# Patient Record
Sex: Female | Born: 1955
Health system: Southern US, Community
[De-identification: ages and names within clinical notes are randomized; demographics above are authoritative.]

## PROBLEM LIST (undated history)

## (undated) DIAGNOSIS — K219 Gastro-esophageal reflux disease without esophagitis: Secondary | ICD-10-CM

## (undated) DIAGNOSIS — F319 Bipolar disorder, unspecified: Secondary | ICD-10-CM

## (undated) DIAGNOSIS — E785 Hyperlipidemia, unspecified: Secondary | ICD-10-CM

## (undated) DIAGNOSIS — H269 Unspecified cataract: Secondary | ICD-10-CM

## (undated) DIAGNOSIS — F329 Major depressive disorder, single episode, unspecified: Secondary | ICD-10-CM

## (undated) DIAGNOSIS — F411 Generalized anxiety disorder: Secondary | ICD-10-CM

## (undated) DIAGNOSIS — G4733 Obstructive sleep apnea (adult) (pediatric): Secondary | ICD-10-CM

## (undated) DIAGNOSIS — R928 Other abnormal and inconclusive findings on diagnostic imaging of breast: Secondary | ICD-10-CM

## (undated) DIAGNOSIS — I951 Orthostatic hypotension: Secondary | ICD-10-CM

## (undated) DIAGNOSIS — M1990 Unspecified osteoarthritis, unspecified site: Secondary | ICD-10-CM

## (undated) DIAGNOSIS — I209 Angina pectoris, unspecified: Secondary | ICD-10-CM

## (undated) DIAGNOSIS — F1020 Alcohol dependence, uncomplicated: Secondary | ICD-10-CM

## (undated) DIAGNOSIS — E559 Vitamin D deficiency, unspecified: Secondary | ICD-10-CM

## (undated) DIAGNOSIS — K635 Polyp of colon: Secondary | ICD-10-CM

## (undated) DIAGNOSIS — I4891 Unspecified atrial fibrillation: Secondary | ICD-10-CM

## (undated) DIAGNOSIS — I219 Acute myocardial infarction, unspecified: Secondary | ICD-10-CM

## (undated) DIAGNOSIS — H35039 Hypertensive retinopathy, unspecified eye: Secondary | ICD-10-CM

## (undated) DIAGNOSIS — Z9049 Acquired absence of other specified parts of digestive tract: Secondary | ICD-10-CM

## (undated) DIAGNOSIS — Z789 Other specified health status: Secondary | ICD-10-CM

## (undated) DIAGNOSIS — F32A Depression, unspecified: Secondary | ICD-10-CM

## (undated) DIAGNOSIS — I10 Essential (primary) hypertension: Secondary | ICD-10-CM

## (undated) DIAGNOSIS — I25119 Atherosclerotic heart disease of native coronary artery with unspecified angina pectoris: Secondary | ICD-10-CM

## (undated) DIAGNOSIS — I499 Cardiac arrhythmia, unspecified: Secondary | ICD-10-CM

## (undated) DIAGNOSIS — M5020 Other cervical disc displacement, unspecified cervical region: Secondary | ICD-10-CM

## (undated) DIAGNOSIS — F419 Anxiety disorder, unspecified: Secondary | ICD-10-CM

## (undated) DIAGNOSIS — R55 Syncope and collapse: Secondary | ICD-10-CM

## (undated) HISTORY — DX: Other specified health status: Z78.9

## (undated) HISTORY — DX: Hyperlipidemia, unspecified: E78.5

## (undated) HISTORY — DX: Bipolar disorder, unspecified: F31.9

## (undated) HISTORY — DX: Acquired absence of other specified parts of digestive tract: Z90.49

## (undated) HISTORY — DX: Unspecified osteoarthritis, unspecified site: M19.90

## (undated) HISTORY — PX: URETHRAL DILATION: SUR417

## (undated) HISTORY — DX: Orthostatic hypotension: I95.1

## (undated) HISTORY — DX: Major depressive disorder, single episode, unspecified: F32.9

## (undated) HISTORY — DX: Other cervical disc displacement, unspecified cervical region: M50.20

## (undated) HISTORY — DX: Generalized anxiety disorder: F41.1

## (undated) HISTORY — DX: Syncope and collapse: R55

## (undated) HISTORY — DX: Obstructive sleep apnea (adult) (pediatric): G47.33

## (undated) HISTORY — DX: Alcohol dependence, uncomplicated: F10.20

## (undated) HISTORY — DX: Other abnormal and inconclusive findings on diagnostic imaging of breast: R92.8

## (undated) HISTORY — PX: APPENDECTOMY: SHX54

## (undated) HISTORY — DX: Polyp of colon: K63.5

## (undated) HISTORY — DX: Anxiety disorder, unspecified: F41.9

## (undated) HISTORY — DX: Hypertensive retinopathy, unspecified eye: H35.039

## (undated) HISTORY — DX: Unspecified cataract: H26.9

## (undated) HISTORY — DX: Depression, unspecified: F32.A

## (undated) HISTORY — PX: EYE SURGERY: SHX253

## (undated) HISTORY — PX: MOUTH SURGERY: SHX715

## (undated) HISTORY — DX: Atherosclerotic heart disease of native coronary artery with unspecified angina pectoris: I25.119

---
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
## 1997-11-21 ENCOUNTER — Emergency Department (HOSPITAL_COMMUNITY): Admission: EM | Admit: 1997-11-21 | Discharge: 1997-11-21 | Payer: Self-pay | Admitting: Emergency Medicine

## 1999-10-02 ENCOUNTER — Encounter: Payer: Self-pay | Admitting: Gastroenterology

## 1999-10-02 ENCOUNTER — Encounter: Admission: RE | Admit: 1999-10-02 | Discharge: 1999-10-02 | Payer: Self-pay | Admitting: Gastroenterology

## 2000-12-09 ENCOUNTER — Encounter: Admission: RE | Admit: 2000-12-09 | Discharge: 2000-12-09 | Payer: Self-pay | Admitting: Gastroenterology

## 2000-12-09 ENCOUNTER — Encounter: Payer: Self-pay | Admitting: Gastroenterology

## 2001-04-05 ENCOUNTER — Other Ambulatory Visit: Admission: RE | Admit: 2001-04-05 | Discharge: 2001-04-05 | Payer: Self-pay | Admitting: Obstetrics and Gynecology

## 2002-05-04 ENCOUNTER — Encounter: Admission: RE | Admit: 2002-05-04 | Discharge: 2002-05-04 | Payer: Self-pay | Admitting: Gastroenterology

## 2002-05-04 ENCOUNTER — Encounter: Payer: Self-pay | Admitting: Gastroenterology

## 2002-06-07 ENCOUNTER — Encounter: Payer: Self-pay | Admitting: Gastroenterology

## 2002-06-07 ENCOUNTER — Encounter: Admission: RE | Admit: 2002-06-07 | Discharge: 2002-06-07 | Payer: Self-pay | Admitting: Gastroenterology

## 2003-06-07 ENCOUNTER — Ambulatory Visit (HOSPITAL_COMMUNITY): Admission: RE | Admit: 2003-06-07 | Discharge: 2003-06-07 | Payer: Self-pay | Admitting: Gastroenterology

## 2003-08-02 ENCOUNTER — Other Ambulatory Visit: Admission: RE | Admit: 2003-08-02 | Discharge: 2003-08-02 | Payer: Self-pay | Admitting: Obstetrics and Gynecology

## 2004-02-21 ENCOUNTER — Ambulatory Visit (HOSPITAL_COMMUNITY): Admission: RE | Admit: 2004-02-21 | Discharge: 2004-02-21 | Payer: Self-pay | Admitting: Obstetrics and Gynecology

## 2004-05-15 ENCOUNTER — Ambulatory Visit (HOSPITAL_COMMUNITY): Admission: RE | Admit: 2004-05-15 | Discharge: 2004-05-15 | Payer: Self-pay | Admitting: Obstetrics and Gynecology

## 2004-08-14 ENCOUNTER — Emergency Department (HOSPITAL_COMMUNITY): Admission: EM | Admit: 2004-08-14 | Discharge: 2004-08-14 | Payer: Self-pay | Admitting: Emergency Medicine

## 2004-08-17 ENCOUNTER — Other Ambulatory Visit: Admission: RE | Admit: 2004-08-17 | Discharge: 2004-08-17 | Payer: Self-pay | Admitting: Obstetrics and Gynecology

## 2004-10-09 ENCOUNTER — Encounter: Admission: RE | Admit: 2004-10-09 | Discharge: 2004-10-09 | Payer: Self-pay | Admitting: Obstetrics and Gynecology

## 2005-04-03 ENCOUNTER — Ambulatory Visit (HOSPITAL_COMMUNITY): Admission: RE | Admit: 2005-04-03 | Discharge: 2005-04-03 | Payer: Self-pay | Admitting: Obstetrics and Gynecology

## 2005-05-07 ENCOUNTER — Encounter: Admission: RE | Admit: 2005-05-07 | Discharge: 2005-05-07 | Payer: Self-pay | Admitting: Gastroenterology

## 2006-11-02 ENCOUNTER — Other Ambulatory Visit: Admission: RE | Admit: 2006-11-02 | Discharge: 2006-11-02 | Payer: Self-pay | Admitting: Obstetrics and Gynecology

## 2006-11-09 ENCOUNTER — Ambulatory Visit (HOSPITAL_COMMUNITY): Admission: RE | Admit: 2006-11-09 | Discharge: 2006-11-09 | Payer: Self-pay | Admitting: Obstetrics & Gynecology

## 2007-07-14 ENCOUNTER — Encounter: Admission: RE | Admit: 2007-07-14 | Discharge: 2007-07-14 | Payer: Self-pay | Admitting: Gastroenterology

## 2007-10-30 ENCOUNTER — Other Ambulatory Visit: Admission: RE | Admit: 2007-10-30 | Discharge: 2007-10-30 | Payer: Self-pay | Admitting: Gastroenterology

## 2008-07-26 DIAGNOSIS — I214 Non-ST elevation (NSTEMI) myocardial infarction: Secondary | ICD-10-CM

## 2008-07-26 HISTORY — DX: Non-ST elevation (NSTEMI) myocardial infarction: I21.4

## 2008-12-14 ENCOUNTER — Ambulatory Visit: Payer: Self-pay | Admitting: Radiology

## 2008-12-14 ENCOUNTER — Encounter: Payer: Self-pay | Admitting: Emergency Medicine

## 2008-12-15 ENCOUNTER — Inpatient Hospital Stay (HOSPITAL_COMMUNITY): Admission: EM | Admit: 2008-12-15 | Discharge: 2008-12-16 | Payer: Self-pay | Admitting: Internal Medicine

## 2008-12-15 ENCOUNTER — Ambulatory Visit: Payer: Self-pay | Admitting: Cardiology

## 2008-12-15 ENCOUNTER — Ambulatory Visit: Payer: Self-pay | Admitting: Internal Medicine

## 2008-12-16 ENCOUNTER — Encounter (INDEPENDENT_AMBULATORY_CARE_PROVIDER_SITE_OTHER): Payer: Self-pay | Admitting: Internal Medicine

## 2010-08-16 ENCOUNTER — Encounter: Payer: Self-pay | Admitting: Obstetrics and Gynecology

## 2010-11-03 LAB — LIPID PANEL
HDL: 34 mg/dL — ABNORMAL LOW (ref >39)
Total CHOL/HDL Ratio: 7.4 RATIO
VLDL: 56 mg/dL — ABNORMAL HIGH (ref 0–40)

## 2010-11-03 LAB — BASIC METABOLIC PANEL
Calcium: 9.3 mg/dL (ref 8.4–10.5)
GFR calc Af Amer: >60 mL/min (ref >60)
GFR calc non Af Amer: >60 mL/min (ref >60)
Sodium: 136 mEq/L (ref 135–145)

## 2010-11-03 LAB — POCT CARDIAC MARKERS
Myoglobin, poc: 78.8 ng/mL (ref 12–200)
Troponin i, poc: <0.05 ng/mL (ref 0.00–0.09)

## 2010-11-03 LAB — CBC
Hemoglobin: 15.1 g/dL — ABNORMAL HIGH (ref 12.0–15.0)
RBC: 4.86 MIL/uL (ref 3.87–5.11)
RDW: 12.4 % (ref 11.5–15.5)

## 2010-11-03 LAB — CARDIAC PANEL(CRET KIN+CKTOT+MB+TROPI): Troponin I: <0.01 ng/mL (ref 0.00–0.06)

## 2010-12-08 NOTE — Discharge Summary (Signed)
NAMEROSALEEN, [name] NO.:  [id number]   MEDICAL RECORD NO.:  [id number]          PATIENT TYPE:  INP   LOCATION:  3307                         FACILITY:  [facility]   PHYSICIAN:  Tasia Catchings, M.D.   DATE OF BIRTH:  February 05, 1956   DATE OF ADMISSION:  12/15/2008  DATE OF DISCHARGE:  12/16/2008                               DISCHARGE SUMMARY   DISCHARGE DIAGNOSES:  1. Atypical chest pain of uncertain etiology.  No evidence of coronary      artery disease.  2. Hypercholesterolemia.  3. Hypertension.  4. Gastroesophageal reflux disease.  5. Bipolar disorder.  6. Irritable bowel syndrome.  7. Chronic folliculitis  8. Degenerative joint disease.  9. Possible sleep apnea.  10.History of abnormal liver functions due to alcohol.   DISCHARGE MEDICATIONS:  1. Micardis 40 mg daily.  2. Cymbalta 30 mg daily.  3. Prevacid 15 mg daily.  4. Isordil 30 mg q.a.m.  5. Omega-3 fatty acids   FOLLOWUP:  The patient is to see me in the office in 1 week.   DIET:  No added salt, fat modified.   ACTIVITY:  Up ad lib.   CONDITION:  Improved.   BRIEF HISTORY:  [name] is a 55 year old patient who on Friday when  reaching her arms up to get something from a cupboard, suddenly  developed retrosternal chest pain.  The pain was steady and constant,  but seemed to be a little more at each heart beat.  The only position  that seemed to help was to recline somewhat, but sitting up at rest was  very painful and it seemed to get worse if she exerted herself.  However, the pain was not associated with dyspnea, diaphoresis, or  nausea.  The patient called the office and was instructed to go to the  emergency room, but refused.  She took an aspirin tablet and it seemed  to ease somewhat and slept Friday night without difficulty.  On Saturday  morning, the pain reoccurred and she eventually went to the emergency  room where in the [facility] with [facility].   She received IV nitroglycerin and the pain dissipated.   Physical examination at that time was unremarkable except for mild  exogenous obesity and some deformity of her joints.  Laboratory data  included the usual cardiac markers, which were negative; a normal CBC;  and a normal CMET.  Chest x-ray, portable showed no active disease.   HOSPITAL COURSE.:  1. Chest pain.  The patient was placed in step down on telemetry.  She      underwent a cardiac evaluation including a stress thallium study,      which was entirely normal.  Serial enzymes were negative.  There      was no ectopy and following the IV nitroglycerin, the pain really      dissipated, not to return.  However, the IV nitroglycerin was      discontinued and the pain remained quiescent.  The patient was able      to eat and  swallow without difficulty.  Therefore, she is being      discharged without a diagnosis, at least without a specific      diagnosis related to her pain.  She will be followed closely.      Tasia Catchings, M.D.  Electronically Signed     JW/MEDQ  D:  12/16/2008  T:  12/16/2008  Job:  [id number]

## 2010-12-08 NOTE — H&P (Signed)
NAMEJANIA, STEINKE NO.:  [id number]   MEDICAL RECORD NO.:  [id number]          PATIENT TYPE:  EMS   LOCATION:  ED                            FACILITY:  [facility]   PHYSICIAN:  Tasia Catchings, M.D.   DATE OF BIRTH:  1956-06-02   DATE OF ADMISSION:  12/14/2008  DATE OF DISCHARGE:  12/15/2008                              HISTORY & PHYSICAL   ADMITTING PHYSICIAN:  Tasia Catchings, M.D.   PRIMARY CARE PHYSICIAN:  Tasia Catchings, M.D.   CHIEF COMPLAINT:  Chest pain.   HISTORY OF PRESENT ILLNESS:  Lindsay [name] is a 55 year old Caucasian female  who lives by herself in [place] when she presented to [facility] outpatient center last night with a complaint of chest pain.  The chest pain started on Dec 13, 2008 at around 1:00 in the afternoon.  She mentioned that her chest pain is stabbing in nature and it comes on  with each heartbeat.  She mentioned that her chest pain is radiating  to  both arms, both shoulders and into the neck with each heartbeat.  She  mentioned that her chest pain gets worse with exertion and relieves with  rest.  She denies any associated symptoms like shortness of breath,  nausea, or sweating at the time of chest pain.  She did feel dizzy  because she thinks her sinuses are clogged up.  She denies any other  symptoms.  She denied any history of coronary artery disease, pulmonary  embolus, pneumonia. She did have a stress test done 15 years ago.   PAST MEDICAL HISTORY:  Positive for chronic pain, degenerative disk  disease in the spine, gastroesophageal reflux disease, hyperlipidemia,  panic attacks, appendectomy, hypertension, and bipolar disorder.   HOME MEDICATIONS:  List includes Micardis and Cymbalta.   ALLERGIES:  SULFA drugs.   FAMILY HISTORY:  Nothing remarkable.   SOCIAL HISTORY:  The patient lives by herself. She has 4 cats.  She  denied any history of tobacco use.  She previously drank heavily in the  past.  No drug use  in the past.   REVIEW OF SYSTEMS:  Positive for HPI.  Otherwise negative review of  systems x14 systems.   PHYSICAL EXAMINATION:  VITAL SIGNS:  Blood pressure 126/76, pulse 81,  respirations 18, oxygen saturation 98% on room air.  GENERAL:  Alert and oriented x3.  Lying in bed without any acute  distress.  CARDIOVASCULAR: S1 and S2 regular.  No murmurs, rubs, or gallops.  LUNGS: Clear to auscultation bilaterally.  No wheezing, rhonchi, rales,  or crackles.  ABDOMEN:  Nontender and nondistended.  Bowel sounds are present.  EXTREMITIES:  No clubbing, cyanosis, or edema.  Pulses palpable in all 4  extremities. She does have trade edema at both ankles.  SKIN: No rash or bruising.  NEUROLOGICAL:  Intact cranial nerves, motor strength, and sensation.  HEENT:  Head normocephalic and nontraumatic. Eyes:  Pupils are equal,  round, and reactive to light and accommodation.  Extraocular muscles are  intact.  Oral cavity, brace on the teeth  noted.  NECK: No thyromegaly or JVD.   LABS:  Laboratory work shows unremarkable CBC with differential.  D-  dimer was negative.  Basic metabolic panel unremarkable.  Chest x-ray  unremarkable for any acute disease.  EKG shows left bundle branch block.  We are uncertain if it is a new onset of old onset.   ASSESSMENT AND PLAN:  1. Chest pain,rule out myocardial infarction.  2. Uncontrolled hypertension, now controlled with nitroglycerin drip.  3. History of chronic pain with degenerative disk disease in spine.  4. History of gastroesophageal reflux disease.  5. History of hyperlipidemia.  6. History of panic attack.  7. History of bipolar disorder.   PLAN:  Will admit the patient on ICU step-down unit because of her need  for nitroglycerin drip. A nitroglycerin drip was started in the ER, and  the patient mentioned that her chest pain is much improved, and her  chest pain is resolved.  She is feeling better. She is continued to have  nitroglycerin drip  tonight to titrate to relieve her chest pain tonight.  The patient is full code.  The patient will be n.p.o. except for  medicines, ice chips and water.  Will recheck vitals and input and  output per protocol.  Will use oxygen by nasal cannula to get saturation  more than 90. Will provide breathing treatments as needed.  Will start  aspirin, metoprolol, Lovenox, simvastatin as per chest pain rule out MI  protocol.  Will continue Micardis and Cymbalta from home dose.  Will get  exercise Cardiolite stress test to be done in the a.m. by [facility].  Will get echocardiogram in the a.m. to be done by [facility] also.  Will provide IV morphine and p.o. Ambien, IV Zofran  p.r.n. for sleep and nausea respectively. Will give IV Nexium 40 mg q.24  h for GI prophylaxis. The patient has already received full dose Lovenox  1 mg per kg subcutaneously in the ER.  Will repeat that every 12 hours.  Will rule out MI with cardiac enzymes x3.  Further plan according to the  workup pending.      Donalynn Furlong, MD   Electronically Signed     ______________________________  Tasia Catchings, M.D.    TVP/MEDQ  D:  12/15/2008  T:  12/15/2008  Job:  [id number]   cc:   Tasia Catchings, M.D.

## 2014-08-28 ENCOUNTER — Encounter (HOSPITAL_BASED_OUTPATIENT_CLINIC_OR_DEPARTMENT_OTHER): Payer: Self-pay | Admitting: *Deleted

## 2014-08-28 ENCOUNTER — Emergency Department (HOSPITAL_BASED_OUTPATIENT_CLINIC_OR_DEPARTMENT_OTHER): Payer: BLUE CROSS/BLUE SHIELD

## 2014-08-28 ENCOUNTER — Emergency Department (HOSPITAL_BASED_OUTPATIENT_CLINIC_OR_DEPARTMENT_OTHER)
Admission: EM | Admit: 2014-08-28 | Discharge: 2014-08-28 | Disposition: A | Discharge status: Home / Self Care | Payer: BLUE CROSS/BLUE SHIELD | Attending: Emergency Medicine | Admitting: Emergency Medicine

## 2014-08-28 DIAGNOSIS — Z8639 Personal history of other endocrine, nutritional and metabolic disease: Secondary | ICD-10-CM | POA: Diagnosis not present

## 2014-08-28 DIAGNOSIS — I10 Essential (primary) hypertension: Secondary | ICD-10-CM | POA: Diagnosis not present

## 2014-08-28 DIAGNOSIS — Z79899 Other long term (current) drug therapy: Secondary | ICD-10-CM | POA: Diagnosis not present

## 2014-08-28 DIAGNOSIS — M7989 Other specified soft tissue disorders: Secondary | ICD-10-CM | POA: Diagnosis present

## 2014-08-28 DIAGNOSIS — L03011 Cellulitis of right finger: Secondary | ICD-10-CM

## 2014-08-28 HISTORY — DX: Vitamin D deficiency, unspecified: E55.9

## 2014-08-28 HISTORY — DX: Essential (primary) hypertension: I10

## 2014-08-28 HISTORY — DX: Angina pectoris, unspecified: I20.9

## 2014-08-28 HISTORY — DX: Unspecified atrial fibrillation: I48.91

## 2014-08-28 LAB — CBC WITH DIFFERENTIAL/PLATELET
BASOS ABS: 0 10*3/uL (ref 0.0–0.1)
Basophils Relative: 0 % (ref 0–1)
Eosinophils Absolute: 0 10*3/uL (ref 0.0–0.7)
Eosinophils Relative: 1 % (ref 0–5)
HCT: 42 % (ref 36.0–46.0)
HEMOGLOBIN: 14.4 g/dL (ref 12.0–15.0)
Lymphocytes Relative: 23 % (ref 12–46)
Lymphs Abs: 2 10*3/uL (ref 0.7–4.0)
MCH: 32.1 pg (ref 26.0–34.0)
MCHC: 34.3 g/dL (ref 30.0–36.0)
MCV: 93.8 fL (ref 78.0–100.0)
Monocytes Absolute: 0.9 10*3/uL (ref 0.1–1.0)
Monocytes Relative: 11 % (ref 3–12)
NEUTROS ABS: 5.8 10*3/uL (ref 1.7–7.7)
NEUTROS PCT: 65 % (ref 43–77)
PLATELETS: 253 10*3/uL (ref 150–400)
RBC: 4.48 MIL/uL (ref 3.87–5.11)
RDW: 13 % (ref 11.5–15.5)
WBC: 8.7 10*3/uL (ref 4.0–10.5)

## 2014-08-28 LAB — BASIC METABOLIC PANEL
ANION GAP: 3 — AB (ref 5–15)
BUN: 19 mg/dL (ref 6–23)
CO2: 28 mmol/L (ref 19–32)
CREATININE: 0.73 mg/dL (ref 0.50–1.10)
Calcium: 9.6 mg/dL (ref 8.4–10.5)
Chloride: 103 mmol/L (ref 96–112)
GFR calc non Af Amer: >90 mL/min (ref >90)
Glucose, Bld: 126 mg/dL — ABNORMAL HIGH (ref 70–99)
Potassium: 3.9 mmol/L (ref 3.5–5.1)
SODIUM: 134 mmol/L — AB (ref 135–145)

## 2014-08-28 LAB — URIC ACID: URIC ACID, SERUM: 4 mg/dL (ref 2.4–7.0)

## 2014-08-28 MED ORDER — CLINDAMYCIN HCL 150 MG PO CAPS
300.0000 mg | ORAL_CAPSULE | Freq: Once | ORAL | Status: AC
  Administered 2014-08-28: 300 mg via ORAL
  Filled 2014-08-28: qty 2

## 2014-08-28 MED ORDER — CLINDAMYCIN HCL 300 MG PO CAPS: 300.0000 mg | ORAL_CAPSULE | Freq: Four times a day (QID) | ORAL | Status: DC

## 2014-08-28 MED ORDER — HYDROCODONE-ACETAMINOPHEN 5-325 MG PO TABS: 1.0000 | ORAL_TABLET | Freq: Four times a day (QID) | ORAL | Status: DC | PRN

## 2014-08-28 NOTE — ED Notes (Signed)
Pt reports she noticed some redness and swelling to right thumb yesterday around the first joint- today entire digit is red and swollen- pt says worse since this morning

## 2014-08-28 NOTE — ED Provider Notes (Signed)
CSN: [id number]     Arrival date & time 08/28/14  1448 History   First MD Initiated Contact with Patient 08/28/14 1503     Chief Complaint  Patient presents with  . Hand Problem     (Consider location/radiation/quality/duration/timing/severity/associated sxs/prior Treatment) HPI Comments: Pt comes in with c/o redness and swelling to the right thumb that started yesterday. Denies any known injury. Denies history of gout and cellulitis. States that the redness worsened today. Pt states that it hurts worst and the dip of the right thumb.   The history is provided by the patient. No language interpreter was used.    Past Medical History  Diagnosis Date  . Hypertension   . Atrial fibrillation   . Anginal pain   . Vitamin D deficiency    Past Surgical History  Procedure Laterality Date  . Appendectomy    . Urethral dilation    . Eye surgery    . Mouth surgery     No family history on file. History  Substance Use Topics  . Smoking status: Never Smoker   . Smokeless tobacco: Never Used  . Alcohol Use: 1.2 oz/week    2 Not specified per week   OB History    No data available     Review of Systems  All other systems reviewed and are negative.     Allergies  Other  Home Medications   Prior to Admission medications   Medication Sig Start Date End Date Taking? Authorizing Provider  buPROPion (WELLBUTRIN) 75 MG tablet Take 75 mg by mouth daily.   Yes Historical Provider, MD  DULoxetine (CYMBALTA) 30 MG capsule Take 30 mg by mouth daily.   Yes Historical Provider, MD  losartan (COZAAR) 50 MG tablet Take 50 mg by mouth daily.   Yes Historical Provider, MD  Multiple Vitamin (MULTIVITAMIN) capsule Take 1 capsule by mouth daily.   Yes Historical Provider, MD   BP 169/88 mmHg  Pulse 77  Temp(Src) 98.6 F (37 C) (Oral)  Resp 18  Ht  (1.6 m)  Wt 180 lb (81.647 kg)  BMI 31.89 kg/m2  SpO2 100% Physical Exam  Constitutional: She is oriented to person, place, and time.  She appears well-developed and well-nourished.  Cardiovascular: Normal rate and regular rhythm.   Pulmonary/Chest: Effort normal and breath sounds normal.  Musculoskeletal: Normal range of motion.  Normal cap refill in the right thumb  Neurological: She is alert and oriented to person, place, and time.  Skin:  Redness swelling and warmth noted to the right thumb. Flexion limited by the swelling. Mild streaking toward the wrist  Nursing note and vitals reviewed.   ED Course  Procedures (including critical care time) Labs Review Labs Reviewed  BASIC METABOLIC PANEL - Abnormal; Notable for the following:    Sodium 134 (*)    Glucose, Bld 126 (*)    Anion gap 3 (*)    All other components within normal limits  CBC WITH DIFFERENTIAL/PLATELET  URIC ACID    Imaging Review Dg Finger Thumb Right  08/28/2014   CLINICAL DATA:  Pain, swelling and redness of the thumb since yesterday, especially the "PIP" joint. No known injury.  EXAM: RIGHT THUMB 2+V  COMPARISON:  None.  FINDINGS: Minimal cortical indistinctness at the marginal base of the thumb proximal phalanx. There is degenerative change at the IP joint of the thumb, with joint narrowing and hook osteophytes. No fracture or dislocation.  IMPRESSION: 1. Questionable erosion at the base of the  first proximal phalanx. Is there history of erosive arthropathy? 2. IP joint osteoarthritis.   Electronically Signed   By: Tiburcio PeaJonathan  Watts M.D.   On: 08/28/2014 15:51     EKG Interpretation None      MDM   Final diagnoses:  Cellulitis of thumb, right    Likely cellulitis with streaking. Will treat with clindamycin and hydrocodone for pain. Discussed return precautions. Doubt septic joint or gout    Teressa LowerVrinda Malanie Koloski, NP 08/28/14 1652  Ethelda ChickMartha K Linker, MD 08/28/14 [phone]

## 2014-08-28 NOTE — Discharge Instructions (Signed)
For worsening redness or swelling. You need to return to the er or follow up with the specialist Cellulitis Cellulitis is an infection of the skin and the tissue beneath it. The infected area is usually red and tender. Cellulitis occurs most often in the arms and lower legs.  CAUSES  Cellulitis is caused by bacteria that enter the skin through cracks or cuts in the skin. The most common types of bacteria that cause cellulitis are staphylococci and streptococci. SIGNS AND SYMPTOMS   Redness and warmth.  Swelling.  Tenderness or pain.  Fever. DIAGNOSIS  Your health care provider can usually determine what is wrong based on a physical exam. Blood tests may also be done. TREATMENT  Treatment usually involves taking an antibiotic medicine. HOME CARE INSTRUCTIONS   Take your antibiotic medicine as directed by your health care provider. Finish the antibiotic even if you start to feel better.  Keep the infected arm or leg elevated to reduce swelling.  Apply a warm cloth to the affected area up to 4 times per day to relieve pain.  Take medicines only as directed by your health care provider.  Keep all follow-up visits as directed by your health care provider. SEEK MEDICAL CARE IF:   You notice red streaks coming from the infected area.  Your red area gets larger or turns dark in color.  Your bone or joint underneath the infected area becomes painful after the skin has healed.  Your infection returns in the same area or another area.  You notice a swollen bump in the infected area.  You develop new symptoms.  You have a fever. SEEK IMMEDIATE MEDICAL CARE IF:   You feel very sleepy.  You develop vomiting or diarrhea.  You have a general ill feeling (malaise) with muscle aches and pains. MAKE SURE YOU:   Understand these instructions.  Will watch your condition.  Will get help right away if you are not doing well or get worse. Document Released: 04/21/2005 Document  Revised: 11/26/2013 Document Reviewed: 09/27/2011 [facility] Patient Information 2015 [place], [place]. This information is not intended to replace advice given to you by your health care provider. Make sure you discuss any questions you have with your health care provider.

## 2014-08-28 NOTE — ED Notes (Signed)
NP at bedside.

## 2014-10-24 ENCOUNTER — Other Ambulatory Visit: Payer: Self-pay | Admitting: Internal Medicine

## 2014-10-24 DIAGNOSIS — R112 Nausea with vomiting, unspecified: Secondary | ICD-10-CM

## 2014-10-25 ENCOUNTER — Other Ambulatory Visit: Payer: BLUE CROSS/BLUE SHIELD

## 2015-02-11 DIAGNOSIS — R079 Chest pain, unspecified: Secondary | ICD-10-CM | POA: Insufficient documentation

## 2015-02-11 DIAGNOSIS — R112 Nausea with vomiting, unspecified: Secondary | ICD-10-CM | POA: Insufficient documentation

## 2015-02-11 HISTORY — DX: Chest pain, unspecified: R07.9

## 2015-02-28 ENCOUNTER — Ambulatory Visit: Payer: BLUE CROSS/BLUE SHIELD | Admitting: Cardiovascular Disease

## 2015-03-03 ENCOUNTER — Encounter: Payer: BLUE CROSS/BLUE SHIELD | Admitting: Cardiovascular Disease

## 2015-03-03 DIAGNOSIS — I10 Essential (primary) hypertension: Secondary | ICD-10-CM | POA: Insufficient documentation

## 2015-03-03 HISTORY — DX: Essential (primary) hypertension: I10

## 2015-03-03 NOTE — Progress Notes (Signed)
This encounter was created in error - please disregard.

## 2015-03-23 NOTE — Progress Notes (Signed)
Cardiology Office Note   Date:  03/24/2015   ID:  Lindsay [name], DOB 1956/06/12, MRN [id number]  PCP:  Pearla Dubonnet, MD  Cardiologist:   Madilyn Hook, MD   Chief Complaint  Patient presents with  . Chest Pain    patient reports chest tightness - has gotten progressively worse over the last several months and is releived with rest, has had heart events in the past -heart attack in 2010, possible atrial fibrillation, left leg swelling, abdominal swelling, shortness of breath on exertion.  . Palpitations      History of Present Illness: Lindsay [name] is a 59 y.o. female with hypertension, CAD s/p MI 2010, and atrial fibrillation who presents for an evaluation of chest pain.  Lindsay [name] reports repeated episodes of substernal chest tightness and stabbing chest pain under her left breast. This is been ongoing for several months. In 2010 she had heart attacks that were not investigated by heart catheterization she had no insurance at that time. She has used nitroglycerin and the chest pain is alleviated with this. Recently the chest pain has been getting worse. She feels very fatigued and 2 days ago felt as though she was not able to get out of bed. Pain is exertional and gets better as soon as she stops whatever she is doing. There is no associated shortness of breath, nausea, vomiting, or diaphoresis. However Lindsay [name] has had over a year of recurrent emesis. There is no associated nausea and she attributes that to acid reflux and esophageal spasm. However she still self discontinued her PPI, and she thought this was actually contributing to the symptoms. In the last couple months she has had 2 episodes of hematemesis with large volumes of blood loss. She was not evaluated at that time. She has been seen by gastroenterologist, but they will not proceed with any endoscopic evaluation until her heart first evaluated.  Lindsay [name] denies orthopnea or PND.  She usually has palpitations, but this has  gotten better since supplementing her vitamin D.   Past Medical History  Diagnosis Date  . Hypertension   . Atrial fibrillation   . Anginal pain   . Vitamin D deficiency   . History of appendectomy 1963  . Coronary artery disease involving native coronary artery with angina pectoris 03/24/2015    Past Surgical History  Procedure Laterality Date  . Appendectomy    . Urethral dilation    . Eye surgery    . Mouth surgery       Current Outpatient Prescriptions  Medication Sig Dispense Refill  . ARIPiprazole (ABILIFY) 5 MG tablet Take 5 mg by mouth daily.    . Cholecalciferol (VITAMIN D3) 5000 UNITS CAPS Take 1 tablet by mouth daily.    . clindamycin (CLEOCIN) 300 MG capsule Take 1 capsule (300 mg total) by mouth 4 (four) times daily. 28 capsule 0  . DULoxetine (CYMBALTA) 30 MG capsule Take 30 mg by mouth daily.    [name] HYDROcodone-acetaminophen (NORCO/VICODIN) 5-325 MG per tablet Take 1-2 tablets by mouth every 6 (six) hours as needed. 15 tablet 0  . hydrOXYzine (VISTARIL) 50 MG capsule Take 1 capsule by mouth as needed.  0  . losartan (COZAAR) 50 MG tablet Take 50 mg by mouth daily.    . Multiple Vitamin (MULTIVITAMIN) capsule Take 1 capsule by mouth daily.    . nitroGLYCERIN (NITROSTAT) 0.4 MG SL tablet Place 1 tablet under the tongue as needed.    . Omega 3 1000  MG CAPS Take 1 tablet by mouth daily.    . Prenatal Vit-Fe Fum-FA-Omega (SM ONE DAILY PRENATAL) 28-0.8 & 440 MG MISC Take 1 packet by mouth daily.    . metoprolol tartrate (LOPRESSOR) 25 MG tablet Take 0.5 tablets (12.5 mg total) by mouth 2 (two) times daily. 30 tablet 11   No current facility-administered medications for this visit.    Allergies:   Other    Social History:  The patient  reports that she has never smoked. She has never used smokeless tobacco. She reports that she drinks about 1.2 oz of alcohol per week. She reports that she does not use illicit drugs.   Family History:  The patient's family history  includes Dementia in her mother; Heart attack in her maternal grandfather, maternal grandmother, and paternal grandfather; Kidney failure in her paternal grandmother; Vascular Disease in her mother.    ROS:  Please see the history of present illness.   Otherwise, review of systems are positive for none.   All other systems are reviewed and negative.    PHYSICAL EXAM: VS:  BP 142/90 mmHg  Pulse 87  Ht 5' 3.75" (1.619 m)  Wt 84.188 kg (185 lb 9.6 oz)  BMI 32.12 kg/m2 , BMI Body mass index is 32.12 kg/(m^2). GENERAL:  Well appearing HEENT:  Pupils equal round and reactive, fundi not visualized, oral mucosa unremarkable NECK:  No jugular venous distention, waveform within normal limits, carotid upstroke brisk and symmetric, no bruits, no thyromegaly LYMPHATICS:  No cervical adenopathy LUNGS:  Clear to auscultation bilaterally HEART:  RRR.  PMI not displaced or sustained,S1 and S2 within normal limits, no S3, no S4, no clicks, no rubs, no murmurs ABD:  Flat, positive bowel sounds normal in frequency in pitch, no bruits, no rebound, no guarding, no midline pulsatile mass, no hepatomegaly, no splenomegaly EXT:  2 plus pulses throughout, no edema, no cyanosis no clubbing SKIN:  No rashes no nodules NEURO:  Cranial nerves II through XII grossly intact, motor grossly intact throughout PSYCH:  Cognitively intact, oriented to person place and time    EKG:  EKG is ordered today. The ekg ordered today demonstrates sinus rhythm at 87 bpm.  Anterolateral TWI concerning for ischemia.     Recent Labs: 08/28/2014: BUN 19; Creatinine, Ser 0.73; Hemoglobin 14.4; Platelets 253; Potassium 3.9; Sodium 134*    Lipid Panel    Component Value Date/Time   CHOL * 12/15/2008 0805    250        ATP III CLASSIFICATION:  <200     mg/dL   Desirable  259-563  mg/dL   Borderline High  >=875    mg/dL   High          TRIG 643* 12/15/2008 0805   HDL 34* 12/15/2008 0805   CHOLHDL 7.4 12/15/2008 0805   VLDL 56*  12/15/2008 0805   LDLCALC * 12/15/2008 0805    160        Total Cholesterol/HDL:CHD Risk Coronary Heart Disease Risk Table                     Men   Women  1/2 Average Risk   3.4   3.3  Average Risk       5.0   4.4  2 X Average Risk   9.6   7.1  3 X Average Risk  23.4   11.0        Use the calculated Patient Ratio above and the  CHD Risk Table to determine the patient's CHD Risk.        ATP III CLASSIFICATION (LDL):  <100     mg/dL   Optimal  161-096  mg/dL   Near or Above                    Optimal  130-159  mg/dL   Borderline  045-409  mg/dL   High  >811     mg/dL   Very High      Wt Readings from Last 3 Encounters:  03/24/15 84.188 kg (185 lb 9.6 oz)  08/28/14 81.647 kg (180 lb)    TTE 12/16/08: LVEF 55-60%.  Mild LVH.  RV and RA  mildly dilated.  Other studies Reviewed: Additional studies/ records that were reviewed today include: n/a. Review of the above records demonstrates:  Please see elsewhere in the note.     ASSESSMENT AND PLAN:  # CCS Class IV angina: Lindsay [name] symptoms and ECG changes are very concerning for angina.  Given her prior NSTEMI, unknown anatomy, and ECG changes, she has a high pre-test probability of CAD and will proceed directly with cath.  She prefers to do this tomorrow rather than today.  She had active CP in the exam room, but it was alleviated with sublingual nitroglycerin.  Therefore it seems safe to wait until tomorrow.  Given her recent upper GI bleeding, she is not a great candidate for long-term P2Y12 therapy.  Would consider BMS if stents are needed.  Patient was encouraged to restart her PPI.   - BMP, CBC, coags in prep for LHC - LHC tomorrow with Dr. Herbie Baltimore - restart PPI - start metoprolol 12.5mg  bid - Will need lipids when fasting and likely start statin therapy  # Hypertension: BP above goal.   - Continue losartan - Add metoprolol tartrate 12.5mg  bid  # Atrial fibrillation: Currently in sinus rhythm.  Discussion was deferred  given that her CP was more pressing at this time.  Current medicines are reviewed at length with the patient today.  The patient does not have concerns regarding medicines.  The following changes have been made:  Start metoprolol  Labs/ tests ordered today include: coronary angiography  Orders Placed This Encounter  Procedures  . DG Chest 2 View  . Comprehensive metabolic panel  . CBC  . Protime-INR  . APTT  . TSH  . EKG 12-Lead  . LEFT HEART CATHETERIZATION WITH CORONARY/GRAFT ANGIOGRAM     Disposition:   FU with Dr. Elmarie Shiley C. [facility] Salvia post cath   Signed, Madilyn Hook, MD  03/24/2015 11:19 AM    [facility]

## 2015-03-24 ENCOUNTER — Telehealth: Payer: Self-pay | Admitting: Cardiovascular Disease

## 2015-03-24 ENCOUNTER — Ambulatory Visit
Admission: RE | Admit: 2015-03-24 | Discharge: 2015-03-24 | Disposition: A | Discharge status: Home / Self Care | Payer: BLUE CROSS/BLUE SHIELD | Source: Ambulatory Visit | Attending: Cardiovascular Disease | Admitting: Cardiovascular Disease

## 2015-03-24 ENCOUNTER — Encounter: Payer: Self-pay | Admitting: Cardiovascular Disease

## 2015-03-24 ENCOUNTER — Telehealth: Payer: Self-pay | Admitting: *Deleted

## 2015-03-24 ENCOUNTER — Ambulatory Visit (INDEPENDENT_AMBULATORY_CARE_PROVIDER_SITE_OTHER): Payer: BLUE CROSS/BLUE SHIELD | Admitting: Cardiovascular Disease

## 2015-03-24 ENCOUNTER — Encounter: Payer: Self-pay | Admitting: Cardiology

## 2015-03-24 VITALS — BP 142/90 | HR 87 | Ht 63.75 in | Wt 185.6 lb

## 2015-03-24 DIAGNOSIS — R5383 Other fatigue: Secondary | ICD-10-CM

## 2015-03-24 DIAGNOSIS — I208 Other forms of angina pectoris: Secondary | ICD-10-CM

## 2015-03-24 DIAGNOSIS — Z01818 Encounter for other preprocedural examination: Secondary | ICD-10-CM

## 2015-03-24 DIAGNOSIS — R5381 Other malaise: Secondary | ICD-10-CM

## 2015-03-24 DIAGNOSIS — D689 Coagulation defect, unspecified: Secondary | ICD-10-CM

## 2015-03-24 DIAGNOSIS — I25119 Atherosclerotic heart disease of native coronary artery with unspecified angina pectoris: Secondary | ICD-10-CM | POA: Insufficient documentation

## 2015-03-24 DIAGNOSIS — I214 Non-ST elevation (NSTEMI) myocardial infarction: Secondary | ICD-10-CM

## 2015-03-24 HISTORY — DX: Atherosclerotic heart disease of native coronary artery with unspecified angina pectoris: I25.119

## 2015-03-24 LAB — CBC
HCT: 41.2 % (ref 36.0–46.0)
HEMOGLOBIN: 14 g/dL (ref 12.0–15.0)
MCH: 31.7 pg (ref 26.0–34.0)
MCHC: 34 g/dL (ref 30.0–36.0)
MCV: 93.2 fL (ref 78.0–100.0)
MPV: 9.2 fL (ref 8.6–12.4)
PLATELETS: 274 10*3/uL (ref 150–400)
RBC: 4.42 MIL/uL (ref 3.87–5.11)
RDW: 13.3 % (ref 11.5–15.5)
WBC: 5.9 10*3/uL (ref 4.0–10.5)

## 2015-03-24 LAB — COMPREHENSIVE METABOLIC PANEL
ALT: 44 U/L — AB (ref 6–29)
AST: 34 U/L (ref 10–35)
Albumin: 4.8 g/dL (ref 3.6–5.1)
Alkaline Phosphatase: 89 U/L (ref 33–130)
BILIRUBIN TOTAL: 0.4 mg/dL (ref 0.2–1.2)
BUN: 10 mg/dL (ref 7–25)
CALCIUM: 9.7 mg/dL (ref 8.6–10.4)
CO2: 25 mmol/L (ref 20–31)
Chloride: 101 mmol/L (ref 98–110)
Creat: 0.74 mg/dL (ref 0.50–0.99)
GLUCOSE: 112 mg/dL — AB (ref 65–99)
Potassium: 4.3 mmol/L (ref 3.5–5.3)
SODIUM: 139 mmol/L (ref 135–146)
Total Protein: 7.4 g/dL (ref 6.1–8.1)

## 2015-03-24 LAB — APTT: APTT: 36 s (ref 24–37)

## 2015-03-24 LAB — PROTIME-INR
INR: 0.89 (ref <1.50)
PROTHROMBIN TIME: 12 s (ref 11.6–15.2)

## 2015-03-24 LAB — TSH: TSH: 2.652 u[IU]/mL (ref 0.350–4.500)

## 2015-03-24 MED ORDER — METOPROLOL TARTRATE 25 MG PO TABS: 12.5000 mg | ORAL_TABLET | Freq: Two times a day (BID) | ORAL | Status: DC

## 2015-03-24 NOTE — Telephone Encounter (Signed)
Jessica from [facility] is calling with some Stat Labs.[name]

## 2015-03-24 NOTE — Telephone Encounter (Signed)
MD aware that STAT labs have resulted.

## 2015-03-24 NOTE — Telephone Encounter (Signed)
-----   Message from Chilton Si, MD sent at 03/24/2015  5:07 PM EDT ----- Liver function mildly abnormal but labs are otherwise normal.

## 2015-03-24 NOTE — Patient Instructions (Addendum)
Dr Duke Salvia has requested that you have a cardiac catheterization tomorrow, August 30th. Cardiac catheterization is used to diagnose and/or treat various heart conditions. Doctors may recommend this procedure for a number of different reasons. The most common reason is to evaluate chest pain. Chest pain can be a symptom of coronary artery disease (CAD), and cardiac catheterization can show whether plaque is narrowing or blocking your heart's arteries. This procedure is also used to evaluate the valves, as well as measure the blood flow and oxygen levels in different parts of your heart. For further information please visit [email or web address] Please follow instruction sheet, as given.  Your physician has ordered you to have some blood work/chest xray prior to your procedure. The scheduler will tell you when to get this done. Please have your blood work done at the [facility] ([address]).

## 2015-03-24 NOTE — Telephone Encounter (Signed)
Spoke to patient. Result given . Verbalized understanding  

## 2015-03-25 ENCOUNTER — Ambulatory Visit (HOSPITAL_COMMUNITY)
Admission: RE | Admit: 2015-03-25 | Discharge: 2015-03-25 | Disposition: A | Discharge status: Home / Self Care | Payer: BLUE CROSS/BLUE SHIELD | Source: Ambulatory Visit | Attending: Cardiology | Admitting: Cardiology

## 2015-03-25 ENCOUNTER — Ambulatory Visit: Payer: BLUE CROSS/BLUE SHIELD | Admitting: Cardiovascular Disease

## 2015-03-25 ENCOUNTER — Encounter (HOSPITAL_COMMUNITY): Payer: Self-pay | Admitting: Cardiology

## 2015-03-25 ENCOUNTER — Encounter (HOSPITAL_COMMUNITY)
Admission: RE | Disposition: A | Discharge status: Home / Self Care | Payer: Self-pay | Source: Ambulatory Visit | Attending: Cardiology

## 2015-03-25 DIAGNOSIS — I252 Old myocardial infarction: Secondary | ICD-10-CM | POA: Insufficient documentation

## 2015-03-25 DIAGNOSIS — I214 Non-ST elevation (NSTEMI) myocardial infarction: Secondary | ICD-10-CM | POA: Diagnosis present

## 2015-03-25 DIAGNOSIS — E559 Vitamin D deficiency, unspecified: Secondary | ICD-10-CM | POA: Diagnosis not present

## 2015-03-25 DIAGNOSIS — I10 Essential (primary) hypertension: Secondary | ICD-10-CM | POA: Diagnosis present

## 2015-03-25 DIAGNOSIS — I200 Unstable angina: Secondary | ICD-10-CM | POA: Diagnosis present

## 2015-03-25 DIAGNOSIS — I25119 Atherosclerotic heart disease of native coronary artery with unspecified angina pectoris: Secondary | ICD-10-CM | POA: Diagnosis present

## 2015-03-25 DIAGNOSIS — I25110 Atherosclerotic heart disease of native coronary artery with unstable angina pectoris: Secondary | ICD-10-CM | POA: Insufficient documentation

## 2015-03-25 DIAGNOSIS — I4891 Unspecified atrial fibrillation: Secondary | ICD-10-CM | POA: Diagnosis not present

## 2015-03-25 HISTORY — PX: CARDIAC CATHETERIZATION: SHX172

## 2015-03-25 SURGERY — LEFT HEART CATH AND CORONARY ANGIOGRAPHY
Anesthesia: LOCAL

## 2015-03-25 MED ORDER — LIDOCAINE HCL (PF) 1 % IJ SOLN
INTRAMUSCULAR | Status: AC
  Filled 2015-03-25: qty 30

## 2015-03-25 MED ORDER — RADIAL COCKTAIL (HEPARIN/VERAPAMIL/LIDOCAINE/NITRO)
Status: DC | PRN
  Administered 2015-03-25: 20 mL via INTRA_ARTERIAL

## 2015-03-25 MED ORDER — MIDAZOLAM HCL 2 MG/2ML IJ SOLN
INTRAMUSCULAR | Status: AC
Start: 2015-03-25 — End: 2015-03-25
  Filled 2015-03-25: qty 4

## 2015-03-25 MED ORDER — ASPIRIN 81 MG PO CHEW
CHEWABLE_TABLET | ORAL | Status: AC
  Administered 2015-03-25: 81 mg via ORAL
  Filled 2015-03-25: qty 1

## 2015-03-25 MED ORDER — ONDANSETRON HCL 4 MG/2ML IJ SOLN: 4.0000 mg | Freq: Four times a day (QID) | INTRAMUSCULAR | Status: DC | PRN

## 2015-03-25 MED ORDER — SODIUM CHLORIDE 0.9 % IV SOLN: 250.0000 mL | INTRAVENOUS | Status: DC | PRN

## 2015-03-25 MED ORDER — LIDOCAINE HCL (PF) 1 % IJ SOLN
INTRAMUSCULAR | Status: DC | PRN
  Administered 2015-03-25: 09:00:00

## 2015-03-25 MED ORDER — SODIUM CHLORIDE 0.9 % IJ SOLN: 3.0000 mL | Freq: Two times a day (BID) | INTRAMUSCULAR | Status: DC

## 2015-03-25 MED ORDER — SODIUM CHLORIDE 0.9 % WEIGHT BASED INFUSION
3.0000 mL/kg/h | INTRAVENOUS | Status: AC
  Administered 2015-03-25: 3 mL/kg/h via INTRAVENOUS

## 2015-03-25 MED ORDER — SODIUM CHLORIDE 0.9 % WEIGHT BASED INFUSION: 3.0000 mL/kg/h | INTRAVENOUS | Status: DC

## 2015-03-25 MED ORDER — HEPARIN SODIUM (PORCINE) 1000 UNIT/ML IJ SOLN
INTRAMUSCULAR | Status: AC
  Filled 2015-03-25: qty 1

## 2015-03-25 MED ORDER — FENTANYL CITRATE (PF) 100 MCG/2ML IJ SOLN
INTRAMUSCULAR | Status: DC | PRN
  Administered 2015-03-25: 50 ug via INTRAVENOUS

## 2015-03-25 MED ORDER — HEPARIN (PORCINE) IN NACL 2-0.9 UNIT/ML-% IJ SOLN
INTRAMUSCULAR | Status: AC
  Filled 2015-03-25: qty 1000

## 2015-03-25 MED ORDER — SODIUM CHLORIDE 0.9 % WEIGHT BASED INFUSION: 1.0000 mL/kg/h | INTRAVENOUS | Status: DC

## 2015-03-25 MED ORDER — NITROGLYCERIN 1 MG/10 ML FOR IR/CATH LAB
INTRA_ARTERIAL | Status: AC
  Filled 2015-03-25: qty 10

## 2015-03-25 MED ORDER — ACETAMINOPHEN 325 MG PO TABS: 650.0000 mg | ORAL_TABLET | ORAL | Status: DC | PRN

## 2015-03-25 MED ORDER — HEPARIN SODIUM (PORCINE) 1000 UNIT/ML IJ SOLN
INTRAMUSCULAR | Status: DC | PRN
  Administered 2015-03-25: 4500 [IU] via INTRAVENOUS

## 2015-03-25 MED ORDER — SODIUM CHLORIDE 0.9 % IJ SOLN: 3.0000 mL | INTRAMUSCULAR | Status: DC | PRN

## 2015-03-25 MED ORDER — IOHEXOL 350 MG/ML SOLN
INTRAVENOUS | Status: DC | PRN
  Administered 2015-03-25: 80 mL via INTRA_ARTERIAL

## 2015-03-25 MED ORDER — VERAPAMIL HCL 2.5 MG/ML IV SOLN
INTRAVENOUS | Status: AC
  Filled 2015-03-25: qty 2

## 2015-03-25 MED ORDER — ISOSORBIDE MONONITRATE ER 30 MG PO TB24: 30.0000 mg | ORAL_TABLET | Freq: Every day | ORAL | Status: DC

## 2015-03-25 MED ORDER — MIDAZOLAM HCL 2 MG/2ML IJ SOLN
INTRAMUSCULAR | Status: DC | PRN
  Administered 2015-03-25: 2 mg via INTRAVENOUS

## 2015-03-25 MED ORDER — ASPIRIN 81 MG PO CHEW
81.0000 mg | CHEWABLE_TABLET | ORAL | Status: AC
  Administered 2015-03-25: 81 mg via ORAL

## 2015-03-25 MED ORDER — FENTANYL CITRATE (PF) 100 MCG/2ML IJ SOLN
INTRAMUSCULAR | Status: AC
  Filled 2015-03-25: qty 4

## 2015-03-25 SURGICAL SUPPLY — 11 items
CATH INFINITI 5FR ANG PIGTAIL (CATHETERS) ×2 IMPLANT
CATH INFINITI JR4 5F (CATHETERS) ×2 IMPLANT
CATH OPTITORQUE TIG 4.0 5F (CATHETERS) ×2 IMPLANT
DEVICE RAD COMP TR BAND LRG (VASCULAR PRODUCTS) ×2 IMPLANT
GLIDESHEATH SLEND A-KIT 6F 22G (SHEATH) ×2 IMPLANT
KIT HEART LEFT (KITS) ×2 IMPLANT
PACK CARDIAC CATHETERIZATION (CUSTOM PROCEDURE TRAY) ×2 IMPLANT
SYR MEDRAD MARK V 150ML (SYRINGE) ×2 IMPLANT
TRANSDUCER W/STOPCOCK (MISCELLANEOUS) ×2 IMPLANT
TUBING CIL FLEX 10 FLL-RA (TUBING) ×2 IMPLANT
WIRE SAFE-T 1.5MM-J .035X260CM (WIRE) ×2 IMPLANT

## 2015-03-25 NOTE — H&P (View-Only) (Signed)
Cardiology Office Note   Date:  03/24/2015   ID:  Lindsay [name], DOB 1956/06/12, MRN [id number]  PCP:  Pearla Dubonnet, MD  Cardiologist:   Madilyn Hook, MD   Chief Complaint  Patient presents with  . Chest Pain    patient reports chest tightness - has gotten progressively worse over the last several months and is releived with rest, has had heart events in the past -heart attack in 2010, possible atrial fibrillation, left leg swelling, abdominal swelling, shortness of breath on exertion.  . Palpitations      History of Present Illness: Lindsay [name] is a 59 y.o. female with hypertension, CAD s/p MI 2010, and atrial fibrillation who presents for an evaluation of chest pain.  Lindsay [name] reports repeated episodes of substernal chest tightness and stabbing chest pain under her left breast. This is been ongoing for several months. In 2010 she had heart attacks that were not investigated by heart catheterization she had no insurance at that time. She has used nitroglycerin and the chest pain is alleviated with this. Recently the chest pain has been getting worse. She feels very fatigued and 2 days ago felt as though she was not able to get out of bed. Pain is exertional and gets better as soon as she stops whatever she is doing. There is no associated shortness of breath, nausea, vomiting, or diaphoresis. However Lindsay [name] has had over a year of recurrent emesis. There is no associated nausea and she attributes that to acid reflux and esophageal spasm. However she still self discontinued her PPI, and she thought this was actually contributing to the symptoms. In the last couple months she has had 2 episodes of hematemesis with large volumes of blood loss. She was not evaluated at that time. She has been seen by gastroenterologist, but they will not proceed with any endoscopic evaluation until her heart first evaluated.  Lindsay [name] denies orthopnea or PND.  She usually has palpitations, but this has  gotten better since supplementing her vitamin D.   Past Medical History  Diagnosis Date  . Hypertension   . Atrial fibrillation   . Anginal pain   . Vitamin D deficiency   . History of appendectomy 1963  . Coronary artery disease involving native coronary artery with angina pectoris 03/24/2015    Past Surgical History  Procedure Laterality Date  . Appendectomy    . Urethral dilation    . Eye surgery    . Mouth surgery       Current Outpatient Prescriptions  Medication Sig Dispense Refill  . ARIPiprazole (ABILIFY) 5 MG tablet Take 5 mg by mouth daily.    . Cholecalciferol (VITAMIN D3) 5000 UNITS CAPS Take 1 tablet by mouth daily.    . clindamycin (CLEOCIN) 300 MG capsule Take 1 capsule (300 mg total) by mouth 4 (four) times daily. 28 capsule 0  . DULoxetine (CYMBALTA) 30 MG capsule Take 30 mg by mouth daily.    [name] HYDROcodone-acetaminophen (NORCO/VICODIN) 5-325 MG per tablet Take 1-2 tablets by mouth every 6 (six) hours as needed. 15 tablet 0  . hydrOXYzine (VISTARIL) 50 MG capsule Take 1 capsule by mouth as needed.  0  . losartan (COZAAR) 50 MG tablet Take 50 mg by mouth daily.    . Multiple Vitamin (MULTIVITAMIN) capsule Take 1 capsule by mouth daily.    . nitroGLYCERIN (NITROSTAT) 0.4 MG SL tablet Place 1 tablet under the tongue as needed.    . Omega 3 1000  MG CAPS Take 1 tablet by mouth daily.    . Prenatal Vit-Fe Fum-FA-Omega (SM ONE DAILY PRENATAL) 28-0.8 & 440 MG MISC Take 1 packet by mouth daily.    . metoprolol tartrate (LOPRESSOR) 25 MG tablet Take 0.5 tablets (12.5 mg total) by mouth 2 (two) times daily. 30 tablet 11   No current facility-administered medications for this visit.    Allergies:   Other    Social History:  The patient  reports that she has never smoked. She has never used smokeless tobacco. She reports that she drinks about 1.2 oz of alcohol per week. She reports that she does not use illicit drugs.   Family History:  The patient's family history  includes Dementia in her mother; Heart attack in her maternal grandfather, maternal grandmother, and paternal grandfather; Kidney failure in her paternal grandmother; Vascular Disease in her mother.    ROS:  Please see the history of present illness.   Otherwise, review of systems are positive for none.   All other systems are reviewed and negative.    PHYSICAL EXAM: VS:  BP 142/90 mmHg  Pulse 87  Ht 5' 3.75" (1.619 m)  Wt 84.188 kg (185 lb 9.6 oz)  BMI 32.12 kg/m2 , BMI Body mass index is 32.12 kg/(m^2). GENERAL:  Well appearing HEENT:  Pupils equal round and reactive, fundi not visualized, oral mucosa unremarkable NECK:  No jugular venous distention, waveform within normal limits, carotid upstroke brisk and symmetric, no bruits, no thyromegaly LYMPHATICS:  No cervical adenopathy LUNGS:  Clear to auscultation bilaterally HEART:  RRR.  PMI not displaced or sustained,S1 and S2 within normal limits, no S3, no S4, no clicks, no rubs, no murmurs ABD:  Flat, positive bowel sounds normal in frequency in pitch, no bruits, no rebound, no guarding, no midline pulsatile mass, no hepatomegaly, no splenomegaly EXT:  2 plus pulses throughout, no edema, no cyanosis no clubbing SKIN:  No rashes no nodules NEURO:  Cranial nerves II through XII grossly intact, motor grossly intact throughout PSYCH:  Cognitively intact, oriented to person place and time    EKG:  EKG is ordered today. The ekg ordered today demonstrates sinus rhythm at 87 bpm.  Anterolateral TWI concerning for ischemia.     Recent Labs: 08/28/2014: BUN 19; Creatinine, Ser 0.73; Hemoglobin 14.4; Platelets 253; Potassium 3.9; Sodium 134*    Lipid Panel    Component Value Date/Time   CHOL * 12/15/2008 0805    250        ATP III CLASSIFICATION:  <200     mg/dL   Desirable  259-563  mg/dL   Borderline High  >=875    mg/dL   High          TRIG 643* 12/15/2008 0805   HDL 34* 12/15/2008 0805   CHOLHDL 7.4 12/15/2008 0805   VLDL 56*  12/15/2008 0805   LDLCALC * 12/15/2008 0805    160        Total Cholesterol/HDL:CHD Risk Coronary Heart Disease Risk Table                     Men   Women  1/2 Average Risk   3.4   3.3  Average Risk       5.0   4.4  2 X Average Risk   9.6   7.1  3 X Average Risk  23.4   11.0        Use the calculated Patient Ratio above and the  CHD Risk Table to determine the patient's CHD Risk.        ATP III CLASSIFICATION (LDL):  <100     mg/dL   Optimal  161-096  mg/dL   Near or Above                    Optimal  130-159  mg/dL   Borderline  045-409  mg/dL   High  >811     mg/dL   Very High      Wt Readings from Last 3 Encounters:  03/24/15 84.188 kg (185 lb 9.6 oz)  08/28/14 81.647 kg (180 lb)    TTE 12/16/08: LVEF 55-60%.  Mild LVH.  RV and RA  mildly dilated.  Other studies Reviewed: Additional studies/ records that were reviewed today include: n/a. Review of the above records demonstrates:  Please see elsewhere in the note.     ASSESSMENT AND PLAN:  # CCS Class IV angina: Lindsay [name] symptoms and ECG changes are very concerning for angina.  Given her prior NSTEMI, unknown anatomy, and ECG changes, she has a high pre-test probability of CAD and will proceed directly with cath.  She prefers to do this tomorrow rather than today.  She had active CP in the exam room, but it was alleviated with sublingual nitroglycerin.  Therefore it seems safe to wait until tomorrow.  Given her recent upper GI bleeding, she is not a great candidate for long-term P2Y12 therapy.  Would consider BMS if stents are needed.  Patient was encouraged to restart her PPI.   - BMP, CBC, coags in prep for LHC - LHC tomorrow with Dr. Herbie Baltimore - restart PPI - start metoprolol 12.5mg  bid - Will need lipids when fasting and likely start statin therapy  # Hypertension: BP above goal.   - Continue losartan - Add metoprolol tartrate 12.5mg  bid  # Atrial fibrillation: Currently in sinus rhythm.  Discussion was deferred  given that her CP was more pressing at this time.  Current medicines are reviewed at length with the patient today.  The patient does not have concerns regarding medicines.  The following changes have been made:  Start metoprolol  Labs/ tests ordered today include: coronary angiography  Orders Placed This Encounter  Procedures  . DG Chest 2 View  . Comprehensive metabolic panel  . CBC  . Protime-INR  . APTT  . TSH  . EKG 12-Lead  . LEFT HEART CATHETERIZATION WITH CORONARY/GRAFT ANGIOGRAM     Disposition:   FU with Dr. Elmarie Shiley C. Duke Salvia post cath   Signed, Madilyn Hook, MD  03/24/2015 11:19 AM    [facility]

## 2015-03-25 NOTE — Interval H&P Note (Signed)
History and Physical Interval Note:  03/25/2015 7:48 AM  Lindsay [name]  has presented today for surgery, with the diagnosis of Unstable Angina. The various methods of treatment have been discussed with the patient and family. After consideration of risks, benefits and other options for treatment, the patient has consented to  Procedure(s): Left Heart Cath and Coronary Angiography (N/A) +/- PCI as a surgical intervention .  The patient's history has been reviewed, patient examined, no change in status, stable for surgery.  I have reviewed the patient's chart and labs.  Questions were answered to the patient's satisfaction.     Cath Lab Visit (complete for each Cath Lab visit)  Clinical Evaluation Leading to the Procedure:   ACS: Yes.    Non-ACS:    Anginal Classification: CCS IV  Anti-ischemic medical therapy: Minimal Therapy (1 class of medications)  Non-Invasive Test Results: No non-invasive testing performed  Prior CABG: No previous CABG  AUC  TIMI SCORE  Patient Information:  TIMI Score is 2  UA/NSTEMI and low-risk features (e.g., TIMI score  Revascularization of the presumed culprit artery   U (6)  Indication: 9; Score: 6      HARDING, DAVID W

## 2015-03-25 NOTE — Discharge Instructions (Signed)
Radial Site Care °Refer to this sheet in the next few weeks. These instructions provide you with information on caring for yourself after your procedure. Your caregiver may also give you more specific instructions. Your treatment has been planned according to current medical practices, but problems sometimes occur. Call your caregiver if you have any problems or questions after your procedure. °HOME CARE INSTRUCTIONS °· You may shower the day after the procedure. Remove the bandage (dressing) and gently wash the site with plain soap and water. Gently pat the site dry. °· Do not apply powder or lotion to the site. °· Do not submerge the affected site in water for 3 to 5 days. °· Inspect the site at least twice daily. °· Do not flex or bend the affected arm for 24 hours. °· No lifting over 5 pounds (2.3 kg) for 5 days after your procedure. °· Do not drive home if you are discharged the same day of the procedure. Have someone else drive you. °· You may drive 24 hours after the procedure unless otherwise instructed by your caregiver. °· Do not operate machinery or power tools for 24 hours. °· A responsible adult should be with you for the first 24 hours after you arrive home. °What to expect: °· Any bruising will usually fade within 1 to 2 weeks. °· Blood that collects in the tissue (hematoma) may be painful to the touch. It should usually decrease in size and tenderness within 1 to 2 weeks. °SEEK IMMEDIATE MEDICAL CARE IF: °· You have unusual pain at the radial site. °· You have redness, warmth, swelling, or pain at the radial site. °· You have drainage (other than a small amount of blood on the dressing). °· You have chills. °· You have a fever or persistent symptoms for more than 72 hours. °· You have a fever and your symptoms suddenly get worse. °· Your arm becomes pale, cool, tingly, or numb. °· You have heavy bleeding from the site. Hold pressure on the site. °Document Released: 08/14/2010 Document Revised:  10/04/2011 Document Reviewed: 08/14/2010 °ExitCare® Patient Information ©2015 [employer]. This information is not intended to replace advice given to you by your health care provider. Make sure you discuss any questions you have with your health care provider. ° °

## 2015-03-28 ENCOUNTER — Telehealth: Payer: Self-pay | Admitting: *Deleted

## 2015-03-28 NOTE — Telephone Encounter (Signed)
-----   Message from Chilton Si, MD sent at 03/26/2015  1:02 PM EDT ----- Normal chest x-ray.  There was some arthritis of the spine.

## 2015-03-28 NOTE — Telephone Encounter (Signed)
Left detailed message on secured voicemail . Any question may call back.

## 2015-03-31 NOTE — Progress Notes (Signed)
Cardiology Office Note   Date:  04/01/2015   ID:  Lindsay [name], DOB [birth year], MRN [id number]  PCP:  Lindsay Dubonnet, MD  Cardiologist:   Lindsay Hook, MD   Chief Complaint  Patient presents with  . Hospitalization Follow-up    post cath - no stents. patient reports some chest discomfort-the beta blocker has helped a lot. patient was prescribed doxycycline for a skin infection - hasn't started because she had questions.      History of Present Illness: Lindsay [name] is a 59 y.o. female with hypertension, CAD s/p MI 2010, and atrial fibrillation who presents for follow up post cardiac catheterization.  Lindsay [name] was evaluated in clinic on 03/24/15  with recurrent chest pain.  She was started on metoprolol and referred for cardiac catheterization.  She underwent cardiac catheterization on 8/30 that revealed a 50% RCA lesion but otherwise minimal CAD.  Lindsay [name] has been feeling better since her last clinic appointment. She thinks that starting metoprolol helped significantly. She reports one episode of chest squeezing, though it was much less severe and did not last for long.  Lindsay [name] reports that she has not been exercising. She wonders if she can start back her routine. She also endorses decreased appetite and does not frequently eat breakfast. She is looking forward to being able to undergo evaluation by GI now that she does not have obstructive coronary disease.  She reports to episodes of vomiting since her last appointment here though she did not have significant bleeding with them.   Past Medical History  Diagnosis Date  . Hypertension   . Atrial fibrillation   . Anginal pain   . Vitamin D deficiency   . History of appendectomy 1963  . Coronary artery disease involving native coronary artery with angina pectoris 03/24/2015    Past Surgical History  Procedure Laterality Date  . Appendectomy    . Urethral dilation    . Eye surgery    . Mouth surgery    . Cardiac  catheterization N/A 03/25/2015    Procedure: Left Heart Cath and Coronary Angiography;  Surgeon: Marykay Lex, MD;  Location: [facility] INVASIVE CV LAB;  Service: Cardiovascular;  Laterality: N/A;     Current Outpatient Prescriptions  Medication Sig Dispense Refill  . ARIPiprazole (ABILIFY) 5 MG tablet Take 5 mg by mouth daily.    . Ascorbic Acid (VITAMIN C PO) Take 1 tablet by mouth daily.    [name] aspirin EC 81 MG tablet Take 81 mg by mouth daily.    [name] buPROPion (WELLBUTRIN) 75 MG tablet Take 75 mg by mouth daily.    . Cholecalciferol (VITAMIN D3) 5000 UNITS CAPS Take 5,000 Units by mouth daily.     . Docosahexaenoic Acid (DHA OMEGA 3 PO) Take 1 tablet by mouth daily.    [name] doxycycline (VIBRA-TABS) 100 MG tablet Take 1 tablet by mouth 2 (two) times daily.  0  . DULoxetine (CYMBALTA) 30 MG capsule Take 30 mg by mouth daily.    . hydrOXYzine (VISTARIL) 50 MG capsule Take 1 capsule by mouth at bedtime as needed (sleep).   0  . isosorbide mononitrate (IMDUR) 30 MG 24 hr tablet Take 1 tablet (30 mg total) by mouth at bedtime. Take 30 min after Aspirin 30 tablet 3  . losartan (COZAAR) 100 MG tablet Take 100 mg by mouth daily.  3  . MANGANESE PO Take 1 tablet by mouth daily.    . metoprolol tartrate (LOPRESSOR) 25 MG  tablet Take 0.5 tablets (12.5 mg total) by mouth 2 (two) times daily. 30 tablet 11  . Multiple Vitamin (MULTIVITAMIN) capsule Take 1 capsule by mouth daily.    . Multiple Vitamins-Minerals (ZINC PO) Take 1 tablet by mouth daily.    . nitroGLYCERIN (NITROSTAT) 0.4 MG SL tablet Place 1 tablet under the tongue as needed for chest pain.     [name] POTASSIUM PO Take 1 tablet by mouth daily. OTC Potassium    . TURMERIC PO Take 1 tablet by mouth daily.     No current facility-administered medications for this visit.    Allergies:   Bee venom; Sulfa antibiotics; Cephalexin; Depakote; Risperidone; Statins; Seroquel; Topiramate; Tramadol; Citalopram; and Lamotrigine    Social History:  The patient   reports that she has never smoked. She has never used smokeless tobacco. She reports that she drinks about 1.2 oz of alcohol per week. She reports that she does not use illicit drugs.   Family History:  The patient's family history includes Dementia in her mother; Heart attack in her maternal grandfather, maternal grandmother, and paternal grandfather; Kidney failure in her paternal grandmother; Vascular Disease in her mother.    ROS:  Please see the history of present illness.   Otherwise, review of systems are positive for none.   All other systems are reviewed and negative.    PHYSICAL EXAM: VS:  BP 148/96 mmHg  Pulse 68  Ht 5' 3.75" (1.619 m)  Wt 85.095 kg (187 lb 9.6 oz)  BMI 32.46 kg/m2 , BMI Body mass index is 32.46 kg/(m^2). GENERAL:  Well appearing HEENT:  Pupils equal round and reactive, fundi not visualized, oral mucosa unremarkable NECK:  No jugular venous distention, waveform within normal limits, carotid upstroke brisk and symmetric, no bruits, no thyromegaly LYMPHATICS:  No cervical adenopathy LUNGS:  Clear to auscultation bilaterally HEART:  RRR.  PMI not displaced or sustained,S1 and S2 within normal limits, no S3, no S4, no clicks, no rubs, no murmurs ABD:  Flat, positive bowel sounds normal in frequency in pitch, no bruits, no rebound, no guarding, no midline pulsatile mass, no hepatomegaly, no splenomegaly EXT:  2 plus pulses throughout, no edema, no cyanosis no clubbing SKIN:  No rashes no nodules NEURO:  Cranial nerves II through XII grossly intact, motor grossly intact throughout PSYCH:  Cognitively intact, oriented to person place and time    EKG:  EKG is ordered today. The ekg ordered today demonstrates sinus rhythm at 87 bpm.  Anterolateral TWI concerning for ischemia.     Recent Labs: 03/24/2015: ALT 44*; BUN 10; Creat 0.74; Hemoglobin 14.0; Platelets 274; Potassium 4.3; Sodium 139; TSH 2.652    Lipid Panel    Component Value Date/Time   CHOL *  12/15/2008 0805    250        ATP III CLASSIFICATION:  <200     mg/dL   Desirable  782-956  mg/dL   Borderline High  >=213    mg/dL   High          TRIG 086* 12/15/2008 0805   HDL 34* 12/15/2008 0805   CHOLHDL 7.4 12/15/2008 0805   VLDL 56* 12/15/2008 0805   LDLCALC * 12/15/2008 0805    160        Total Cholesterol/HDL:CHD Risk Coronary Heart Disease Risk Table                     Men   Women  1/2 Average Risk  3.4   3.3  Average Risk       5.0   4.4  2 X Average Risk   9.6   7.1  3 X Average Risk  23.4   11.0        Use the calculated Patient Ratio above and the CHD Risk Table to determine the patient's CHD Risk.        ATP III CLASSIFICATION (LDL):  <100     mg/dL   Optimal  161-096  mg/dL   Near or Above                    Optimal  130-159  mg/dL   Borderline  045-409  mg/dL   High  >811     mg/dL   Very High      Wt Readings from Last 3 Encounters:  04/01/15 85.095 kg (187 lb 9.6 oz)  03/25/15 83.915 kg (185 lb)  03/24/15 84.188 kg (185 lb 9.6 oz)    TTE 12/16/08: LVEF 55-60%.  Mild LVH.  RV and RA  mildly dilated.  Cardiac catheterization 03/25/15: 1. Prox RCA lesion, 20% stenosed. 2. Dist RCA lesion, 50% stenosed. 3. Ost 2nd Diag to 2nd Diag lesion, 30% stenosed. 4. The left ventricular systolic function is normal.  Other studies Reviewed: Additional studies/ records that were reviewed today include: n/a. Review of the above records demonstrates:  Please see elsewhere in the note.     ASSESSMENT AND PLAN:  # Non-obstructive CAD:  Ms. Losano had non-obstructive disease on cardiac cath. - check lipid panel to determine statin dose - continue ASA 81mg  daily - continue metoprolol 12.5mg  bid and Imdur 30mg  daily - OK for GI and dental evaluation and management  # Hypertension: BP above goal.  She took her BP medication just before coming to the office. She reports that it has been running 110 systolic at home. She will check her blood pressure and let us  know if it is not at goal. - Continue losartan and metoprolol  # Atrial fibrillation: Currently in sinus rhythm.  Given her GI bleeding, will not start anticoagulation in addition to aspirin.  Will reconsider after GI evaluation. - Continue aspirin and metoprolol  This patients CHA2DS2-VASc Score and unadjusted Ischemic Stroke Rate (% per year) is equal to 0.6 % stroke rate/year from a score of 1  Above score calculated as 1 point each if present [CHF, HTN, DM, Vascular=MI/PAD/Aortic Plaque, Age if 65-74, or Female] Above score calculated as 2 points each if present [Age > 75, or Stroke/TIA/TE]  # Obesity: Ms. Beane was encouraged to increase her physical activity.  She is excited to start going back to the gym.  Current medicines are reviewed at length with the patient today.  The patient does not have concerns regarding medicines.  The following changes have been made:  None  Labs/ tests ordered today include: lipid panel  No orders of the defined types were placed in this encounter.     Disposition:   FU with Dr. Elmarie Shiley C. Duke Salvia post cath   Signed, Lindsay Hook, MD  04/01/2015 10:36 AM    [facility]

## 2015-04-01 ENCOUNTER — Encounter: Payer: Self-pay | Admitting: Cardiovascular Disease

## 2015-04-01 ENCOUNTER — Ambulatory Visit (INDEPENDENT_AMBULATORY_CARE_PROVIDER_SITE_OTHER): Payer: BLUE CROSS/BLUE SHIELD | Admitting: Cardiovascular Disease

## 2015-04-01 VITALS — BP 148/96 | HR 68 | Ht 63.75 in | Wt 187.6 lb

## 2015-04-01 DIAGNOSIS — I10 Essential (primary) hypertension: Secondary | ICD-10-CM | POA: Diagnosis not present

## 2015-04-01 DIAGNOSIS — E785 Hyperlipidemia, unspecified: Secondary | ICD-10-CM | POA: Diagnosis not present

## 2015-04-01 DIAGNOSIS — I25119 Atherosclerotic heart disease of native coronary artery with unspecified angina pectoris: Secondary | ICD-10-CM

## 2015-04-01 NOTE — Patient Instructions (Signed)
Labs - fasting lipid panel- do on a day with nothing to eat or drink the morning of test.   No other changes at current time.  Your physician wants you to follow-up in 12 months with Dr Duke Salvia.  You will receive a reminder letter in the mail two months in advance. If you don't receive a letter, please call our office to schedule the follow-up appointment.

## 2015-04-25 ENCOUNTER — Telehealth: Payer: Self-pay | Admitting: Cardiovascular Disease

## 2015-04-25 NOTE — Telephone Encounter (Signed)
Lost paperwork to do labs.  Would like to know if they can be faxed to lab or does she need to come by and pick up

## 2015-04-25 NOTE — Telephone Encounter (Signed)
Labs were ordered through [facility] for [facility] lab.  Told her she would not need lab slip to get test done

## 2015-08-25 ENCOUNTER — Other Ambulatory Visit: Payer: Self-pay | Admitting: Cardiovascular Disease

## 2015-08-25 MED ORDER — METOPROLOL TARTRATE 25 MG PO TABS: 12.5000 mg | ORAL_TABLET | Freq: Two times a day (BID) | ORAL | Status: DC

## 2015-08-25 NOTE — Telephone Encounter (Signed)
°*  STAT* If patient is at the pharmacy, call can be transferred to refill team.   1. Which medications need to be refilled? (please list name of each medication and dose if known) Metoprolol Tartrate   2. Which pharmacy/location (including street and city if local pharmacy) is medication to be sent to?[facility] , [address]   3. Do they need a 30 day or 90 day supply? 90

## 2015-08-25 NOTE — Telephone Encounter (Signed)
Refill sent.

## 2015-09-08 DIAGNOSIS — F10929 Alcohol use, unspecified with intoxication, unspecified: Secondary | ICD-10-CM | POA: Insufficient documentation

## 2015-09-08 DIAGNOSIS — F39 Unspecified mood [affective] disorder: Secondary | ICD-10-CM | POA: Insufficient documentation

## 2015-09-08 DIAGNOSIS — I482 Chronic atrial fibrillation, unspecified: Secondary | ICD-10-CM

## 2015-09-08 HISTORY — DX: Chronic atrial fibrillation, unspecified: I48.20

## 2015-12-08 ENCOUNTER — Other Ambulatory Visit: Payer: Self-pay

## 2015-12-08 DIAGNOSIS — Z1231 Encounter for screening mammogram for malignant neoplasm of breast: Secondary | ICD-10-CM

## 2015-12-18 ENCOUNTER — Ambulatory Visit
Admission: RE | Admit: 2015-12-18 | Discharge: 2015-12-18 | Disposition: A | Discharge status: Home / Self Care | Payer: BLUE CROSS/BLUE SHIELD | Source: Ambulatory Visit

## 2015-12-18 ENCOUNTER — Ambulatory Visit: Payer: BLUE CROSS/BLUE SHIELD

## 2015-12-18 DIAGNOSIS — Z1231 Encounter for screening mammogram for malignant neoplasm of breast: Secondary | ICD-10-CM

## 2016-02-06 ENCOUNTER — Telehealth: Payer: Self-pay | Admitting: *Deleted

## 2016-02-06 NOTE — Telephone Encounter (Signed)
Request for surgical clearance:  1. What type of surgery is being performed? Spine: Central decompression lumbar laminectomy and microdiscectomy  L4-L5   2. When is this surgery scheduled? Pending cardiac clearance   3. Are there any medications that need to be held prior to surgery and how long?    4. Name of physician performing surgery? Dr Ranee Gosselinonald Gioffre   5. What is your office phone and fax number? Phone [phone] fax [phone]  Attn: Meredith LeedsVelvet McBride   Received request via fax, will forward to Dr Duke Salviaandolph for review

## 2016-02-06 NOTE — Telephone Encounter (Signed)
Low risk for surgery.  OK to hold aspirin up to 5 days pre-op.

## 2016-02-09 NOTE — Telephone Encounter (Signed)
Sent via Epic

## 2016-03-03 ENCOUNTER — Other Ambulatory Visit: Payer: Self-pay | Admitting: Obstetrics and Gynecology

## 2016-03-03 ENCOUNTER — Other Ambulatory Visit (HOSPITAL_COMMUNITY)
Admission: RE | Admit: 2016-03-03 | Discharge: 2016-03-03 | Disposition: A | Discharge status: Home / Self Care | Payer: BLUE CROSS/BLUE SHIELD | Source: Ambulatory Visit | Attending: Obstetrics and Gynecology | Admitting: Obstetrics and Gynecology

## 2016-03-03 DIAGNOSIS — Z1151 Encounter for screening for human papillomavirus (HPV): Secondary | ICD-10-CM | POA: Diagnosis not present

## 2016-03-03 DIAGNOSIS — Z01419 Encounter for gynecological examination (general) (routine) without abnormal findings: Secondary | ICD-10-CM | POA: Diagnosis present

## 2016-03-05 LAB — CYTOLOGY - PAP

## 2016-03-19 ENCOUNTER — Telehealth (HOSPITAL_COMMUNITY): Payer: Self-pay

## 2016-03-19 NOTE — Telephone Encounter (Signed)
LMOM for pt on 8/22 /  Spoke to [place] at [facility] on 03/19/16 / due to wait times they will refer her elsewhere.

## 2016-04-02 ENCOUNTER — Ambulatory Visit (INDEPENDENT_AMBULATORY_CARE_PROVIDER_SITE_OTHER): Payer: BLUE CROSS/BLUE SHIELD | Admitting: Cardiovascular Disease

## 2016-04-02 ENCOUNTER — Encounter: Payer: Self-pay | Admitting: Cardiovascular Disease

## 2016-04-02 VITALS — BP 129/91 | HR 67 | Ht 63.75 in | Wt 180.6 lb

## 2016-04-02 DIAGNOSIS — E785 Hyperlipidemia, unspecified: Secondary | ICD-10-CM | POA: Diagnosis not present

## 2016-04-02 DIAGNOSIS — I10 Essential (primary) hypertension: Secondary | ICD-10-CM

## 2016-04-02 DIAGNOSIS — Z955 Presence of coronary angioplasty implant and graft: Secondary | ICD-10-CM | POA: Diagnosis not present

## 2016-04-02 DIAGNOSIS — I480 Paroxysmal atrial fibrillation: Secondary | ICD-10-CM

## 2016-04-02 DIAGNOSIS — Z79899 Other long term (current) drug therapy: Secondary | ICD-10-CM | POA: Diagnosis not present

## 2016-04-02 NOTE — Progress Notes (Signed)
Cardiology Office Note   Date:  04/04/2016   ID:  Lindsay [name], DOB April 26, 1956, MRN [id number]  PCP:  Pearla Dubonnet, MD  Cardiologist:   Chilton Si, MD   Chief Complaint  Patient presents with  . Follow-up    lightheaded; frequently. edema and tingling in lower legs.      History of Present Illness: Lindsay [name] is a 60 y.o. female with hypertension, CAD s/p MI in 2010, and atrial fibrillation who presents for follow up post cardiac catheterization.  Lindsay [name] was evaluated in clinic on 03/24/15  with recurrent chest pain.  She was started on metoprolol and referred for cardiac catheterization.  She underwent cardiac catheterization on 02/2015 that revealed a 50% RCA lesion but otherwise minimal CAD.  She has been feeing well and denies any recent exertional chest pain.  Occasionally she has "twinges" of chest pain that are not like when she had an MI. She stopped taking Imdur due to headaches.    Lindsay [name] has been very stressed and was caring for her father.  He has since moved to a facility, which has alleviated a lot of her stress.  However, he is now estranged from her and she does not know where he is living.  She also almost lost her house due to financial constraints.  However, it now looks like she will be able to keep the house.  Her only physical complaint at this time is back pain and left arm pain.  She also has a difficult time sleeping due to odd dreams.  Her mood has been labile and she has been feeling depressed.  She denies SI/HI.  Her psychiatrist recommended that she start Abilify, but she is afraid that she will become a diabetic.  Lindsay [name] has a membership at a local gym and plans to start back exercising after her back pain improves.     Past Medical History:  Diagnosis Date  . Anginal pain (HCC)   . Atrial fibrillation (HCC)   . Coronary artery disease involving native coronary artery with angina pectoris (HCC) 03/24/2015  . History of appendectomy 1963  .  Hypertension   . Vitamin D deficiency     Past Surgical History:  Procedure Laterality Date  . APPENDECTOMY    . CARDIAC CATHETERIZATION N/A 03/25/2015   Procedure: Left Heart Cath and Coronary Angiography;  Surgeon: Marykay Lex, MD;  Location: [facility] INVASIVE CV LAB;  Service: Cardiovascular;  Laterality: N/A;  . EYE SURGERY    . MOUTH SURGERY    . URETHRAL DILATION       Current Outpatient Prescriptions  Medication Sig Dispense Refill  . ARIPiprazole (ABILIFY) 5 MG tablet Take 5 mg by mouth daily.    . Ascorbic Acid (VITAMIN C PO) Take 1 tablet by mouth daily.    [name] aspirin EC 81 MG tablet Take 81 mg by mouth daily.    [name] buPROPion (WELLBUTRIN) 75 MG tablet Take 75 mg by mouth daily.    . Cholecalciferol (VITAMIN D3) 5000 UNITS CAPS Take 5,000 Units by mouth daily.     . Docosahexaenoic Acid (DHA OMEGA 3 PO) Take 1 tablet by mouth daily.    [name] doxycycline (VIBRA-TABS) 100 MG tablet Take 1 tablet by mouth 2 (two) times daily.  0  . DULoxetine (CYMBALTA) 30 MG capsule Take 30 mg by mouth daily.    . hydrOXYzine (VISTARIL) 50 MG capsule Take 1 capsule by mouth at bedtime as needed (sleep).   0  .  losartan (COZAAR) 100 MG tablet Take 100 mg by mouth daily.  3  . MANGANESE PO Take 1 tablet by mouth daily.    . metoprolol tartrate (LOPRESSOR) 25 MG tablet Take 0.5 tablets (12.5 mg total) by mouth 2 (two) times daily. 90 tablet 2  . Multiple Vitamin (MULTIVITAMIN) capsule Take 1 capsule by mouth daily.    . Multiple Vitamins-Minerals (ZINC PO) Take 1 tablet by mouth daily.    . nitroGLYCERIN (NITROSTAT) 0.4 MG SL tablet Place 1 tablet under the tongue as needed for chest pain.     [name] POTASSIUM PO Take 1 tablet by mouth daily. OTC Potassium    . TURMERIC PO Take 1 tablet by mouth daily.     No current facility-administered medications for this visit.     Allergies:   Bee venom; Sulfa antibiotics; Cephalexin; Depakote [divalproex sodium]; Risperidone; Seroquel [quetiapine fumarate];  Statins; Topiramate; Tramadol; Citalopram; and Lamotrigine    Social History:  The patient  reports that she has never smoked. She has never used smokeless tobacco. She reports that she drinks about 1.2 oz of alcohol per week . She reports that she does not use drugs.   Family History:  The patient's family history includes Dementia in her mother; Heart attack in her maternal grandfather, maternal grandmother, and paternal grandfather; Kidney failure in her paternal grandmother; Vascular Disease in her mother.    ROS:  Please see the history of present illness.   Otherwise, review of systems are positive for none.   All other systems are reviewed and negative.    PHYSICAL EXAM: VS:  BP (!) 129/91   Pulse 67   Ht 5' 3.75" (1.619 m)   Wt 180 lb 9.6 oz (81.9 kg)   BMI 31.24 kg/m  , BMI Body mass index is 31.24 kg/m. GENERAL:  Well appearing HEENT:  Pupils equal round and reactive, fundi not visualized, oral mucosa unremarkable NECK:  No jugular venous distention, waveform within normal limits, carotid upstroke brisk and symmetric, no bruits LYMPHATICS:  No cervical adenopathy LUNGS:  Clear to auscultation bilaterally HEART:  RRR.  PMI not displaced or sustained,S1 and S2 within normal limits, no S3, no S4, no clicks, no rubs, no murmurs ABD:  Flat, positive bowel sounds normal in frequency in pitch, no bruits, no rebound, no guarding, no midline pulsatile mass, no hepatomegaly, no splenomegaly EXT:  2 plus pulses throughout, no edema, no cyanosis no clubbing SKIN:  No rashes no nodules NEURO:  Cranial nerves II through XII grossly intact, motor grossly intact throughout PSYCH:  Cognitively intact, oriented to person place and time   EKG:  EKG is not ordered today. The ekg ordered 03/24/15 demonstrates sinus rhythm at 87 bpm.  Anterolateral TWI concerning for ischemia.     Recent Labs: 04/02/2016: ALT 18; BUN 15; Creat 0.88; Potassium 4.1; Sodium 139    Lipid Panel    Component  Value Date/Time   CHOL 320 (H) 04/02/2016 1554   TRIG 222 (H) 04/02/2016 1554   HDL 75 04/02/2016 1554   CHOLHDL 4.3 04/02/2016 1554   VLDL 44 (H) 04/02/2016 1554   LDLCALC 201 (H) 04/02/2016 1554      Wt Readings from Last 3 Encounters:  04/02/16 180 lb 9.6 oz (81.9 kg)  04/01/15 187 lb 9.6 oz (85.1 kg)  03/25/15 185 lb (83.9 kg)    TTE 12/16/08: LVEF 55-60%.  Mild LVH.  RV and RA  mildly dilated.  Cardiac catheterization 03/25/15: 1. Prox RCA lesion, 20%  stenosed. 2. Dist RCA lesion, 50% stenosed. 3. Ost 2nd Diag to 2nd Diag lesion, 30% stenosed. 4. The left ventricular systolic function is normal.  Other studies Reviewed: Additional studies/ records that were reviewed today include: n/a. Review of the above records demonstrates:  Please see elsewhere in the note.     ASSESSMENT AND PLAN:  # Non-obstructive CAD:  Lindsay [name] had non-obstructive disease on cardiac cath 02/2015.  She has not had any chest pain recently.  Continue aspirin and metoprolol.  She has been unable to tolerate statins.  We will check fasting lipids and a CMP. Likely referral to Lipid clinic.  # Hypertension: BP slightly above goal.  Continue losartan and metoprolol  # Atrial fibrillation: Currently in sinus rhythm.  She is not on anticoagulation 2/2 history of GI bleeding,  Continue aspirin and metoprolol  This patients CHA2DS2-VASc Score and unadjusted Ischemic Stroke Rate (% per year) is equal to 0.6 % stroke rate/year from a score of 1  Above score calculated as 1 point each if present [CHF, HTN, DM, Vascular=MI/PAD/Aortic Plaque, Age if 65-74, or Female] Above score calculated as 2 points each if present [Age > 75, or Stroke/TIA/TE]  # Obesity: Lindsay [name] was encouraged to increase her physical activity to at least 30 minutes most days of the week.  She is excited to start going back to the gym.  Current medicines are reviewed at length with the patient today.  The patient does not have concerns  regarding medicines.  The following changes have been made:  None  Labs/ tests ordered today include:  Orders Placed This Encounter  Procedures  . Lipid panel  . Comprehensive metabolic panel   Time spent: 25 minutes-Greater than 50% of this time was spent in counseling, explanation of diagnosis, planning of further management, and coordination of care.   Disposition:   FU with Dr. Elmarie Shileyiffany C. Duke Salviaandolph in 1 year   Signed, Chilton Siiffany Negley, MD  04/04/2016 1:50 PM    [facility]

## 2016-04-02 NOTE — Patient Instructions (Signed)
Medication Instructions:  Your physician recommends that you continue on your current medications as directed. Please refer to the Current Medication list given to you today.  Labwork: [facility]CM[facility] lab on the first floor   Testing/Procedures: none  Follow-Up: Your physician wants you to follow-up in: 1 year ov You will receive a reminder letter in the mail two months in advance. If you don't receive a letter, please call our office to schedule the follow-up appointment.  If you need a refill on your cardiac medications before your next appointment, please call your pharmacy.

## 2016-04-03 LAB — COMPREHENSIVE METABOLIC PANEL
ALK PHOS: 74 U/L (ref 33–130)
ALT: 18 U/L (ref 6–29)
AST: 23 U/L (ref 10–35)
Albumin: 4.7 g/dL (ref 3.6–5.1)
BUN: 15 mg/dL (ref 7–25)
CALCIUM: 10.4 mg/dL (ref 8.6–10.4)
CHLORIDE: 99 mmol/L (ref 98–110)
CO2: 29 mmol/L (ref 20–31)
Creat: 0.88 mg/dL (ref 0.50–0.99)
Glucose, Bld: 106 mg/dL — ABNORMAL HIGH (ref 65–99)
POTASSIUM: 4.1 mmol/L (ref 3.5–5.3)
Sodium: 139 mmol/L (ref 135–146)
TOTAL PROTEIN: 7.3 g/dL (ref 6.1–8.1)
Total Bilirubin: 0.9 mg/dL (ref 0.2–1.2)

## 2016-04-03 LAB — LIPID PANEL
CHOLESTEROL: 320 mg/dL — AB (ref 125–200)
HDL: 75 mg/dL (ref >=46)
LDL Cholesterol: 201 mg/dL — ABNORMAL HIGH (ref <130)
TRIGLYCERIDES: 222 mg/dL — AB (ref <150)
Total CHOL/HDL Ratio: 4.3 Ratio (ref <=5.0)
VLDL: 44 mg/dL — AB (ref <30)

## 2016-04-14 ENCOUNTER — Other Ambulatory Visit: Payer: Self-pay | Admitting: Obstetrics and Gynecology

## 2016-04-26 ENCOUNTER — Encounter (HOSPITAL_COMMUNITY): Payer: Self-pay | Admitting: *Deleted

## 2016-05-05 NOTE — Patient Instructions (Signed)
Your procedure is scheduled on:  Wednesday, Oct. 25, 2017  [facility] at:  9:15 AM  Pick up the phone at the desk and dial [phone].  Call this number if you have problems the morning of surgery: [phone].  Remember: Do NOT eat food or drink after:  Midnight Tuesday, Oct. 24, 2017  Take these medicines the morning of surgery with a SIP OF WATER:  Losartan, Cymbalta, Metoprolol, Potassium, Abilify  Stop taking Vitamin C, Fish oil, and turmeric at this time  Stop ALL herbal medications at this time   Do NOT wear jewelry (body piercing), metal hair clips/bobby pins, make-up, or nail polish. Do NOT wear lotions, powders, or perfumes.  You may wear deodorant. Do NOT shave for 48 hours prior to surgery. Do NOT bring valuables to the hospital. Contacts, dentures, or bridgework may not be worn into surgery.  Have a responsible adult drive you home and stay with you for 24 hours after your procedure

## 2016-05-06 ENCOUNTER — Ambulatory Visit (INDEPENDENT_AMBULATORY_CARE_PROVIDER_SITE_OTHER): Payer: BLUE CROSS/BLUE SHIELD | Admitting: Pharmacist Clinician (PhC)/ Clinical Pharmacy Specialist

## 2016-05-06 ENCOUNTER — Inpatient Hospital Stay (HOSPITAL_COMMUNITY)
Admission: RE | Admit: 2016-05-06 | Discharge: 2016-05-06 | Disposition: A | Discharge status: Home / Self Care | Payer: BLUE CROSS/BLUE SHIELD | Source: Ambulatory Visit

## 2016-05-06 DIAGNOSIS — E785 Hyperlipidemia, unspecified: Secondary | ICD-10-CM | POA: Diagnosis not present

## 2016-05-06 MED ORDER — EZETIMIBE 10 MG PO TABS: 10.0000 mg | ORAL_TABLET | Freq: Every day | ORAL | 3 refills | Status: DC

## 2016-05-06 NOTE — Patient Instructions (Signed)
Will start with ezetimbe 10 mg once daily.  Please call us at [phone] if you have any problems with this.   Cholesterol Cholesterol is a fat. Your body needs a small amount of cholesterol. Cholesterol may build up in your blood vessels. This increases your chance of having a heart attack or stroke. You cannot feel your cholesterol levels. The only way to know your cholesterol level is high is with a blood test. Keep your test results. Work with your doctor to keep your cholesterol at a good level. WHAT DO THE TEST RESULTS MEAN?  Total cholesterol is how much cholesterol is in your blood.  LDL is bad cholesterol. This is the type that can build up. You want LDL to be low.  HDL is good cholesterol. It cleans your blood vessels and carries LDL away. You want HDL to be high.  Triglycerides are fat that the body can burn for energy or store. WHAT ARE GOOD LEVELS OF CHOLESTEROL?  Total cholesterol below 200.  LDL below 100 for people at risk. Below 70 for those at very high risk.  HDL above 50 is good. Above 60 is best.  Triglycerides below 150. HOW CAN I LOWER MY CHOLESTEROL?  Diet. Follow your diet programs as told by your doctor.  Choose fish, white meat chicken, roasted [place]urkey, or baked [place]key. Try not to eat red meat, fried foods, or processed meats such as sausage and lunch meats.  Eat lots of fresh fruits and vegetables.  Choose whole grains, beans, pasta, potatoes, and cereals.  Use only small amounts of olive, corn, or canola oils.  Try not to eat butter, mayonnaise, shortening, or palm kernel oils.  Try not to eat foods with trans fats.  Drink skim or nonfat milk. Eat low-fat or nonfat yogurt and cheeses. Try not to drink whole milk or cream. Try not to eat ice cream, egg yolks, and full-fat cheeses.  Healthy desserts include angel food cake, ginger snaps, animal crackers, hard candy, popsicles, and low-fat or nonfat frozen yogurt. Try not to eat pastries, cakes,  pies, and cookies.  Exercise. Follow your exercise programs as told by your doctor.  Be more active. You can try gardening, walking, or taking the stairs. Ask your doctor about how you can be more active.  Medicine. Take medicine as told by your doctor.   This information is not intended to replace advice given to you by your health care provider. Make sure you discuss any questions you have with your health care provider.   Document Released: 10/08/2008 Document Revised: 08/02/2014 Document Reviewed: 04/25/2013 Elsevier Interactive Patient Education [employer]2016 Elsevier Inc.

## 2016-05-06 NOTE — Progress Notes (Signed)
05/07/2016 Lindsay [name] 10-09-1955 [id number]   HPI:  Lindsay [name] is a 60 y.o. female patient of Dr. Duke Salviaandolph, who presents today for a lipid clinic evaluation.    Current Medications:   none  Risk Factors:  MI in 2010 at the age of 60; noted to have 50% RCA lesion in 2016  Cholesterol Goals:  LDL < 70   Intolerant/previously tried:  Has previously tried several statin drugs, unfortunately this was not with our practice.  Has been several years since her last trial.  She reports muscle weakness and pain with all that were tried.    Family history:   Both parents with multiple strokes, mother lived to age 62, father still living in SNF at age 62.    Diet:   Admits her diet goes thru phases of good/bad.  Does eat meats regularly, but also fresh fruits/vegetables.  Eats yogurts with high protein content daily.  Exercise:    None currently, suffers form back and neck pain regularly;    Labs:  03/2016 - TC 320, TG 222, HDL 75, LDL 201 (no meds)   Current Outpatient Prescriptions  Medication Sig Dispense Refill  . aspirin EC 81 MG tablet Take 81 mg by mouth daily.    . calcium carbonate (TUMS - DOSED IN MG ELEMENTAL CALCIUM) 500 MG chewable tablet Chew 1 tablet by mouth 3 (three) times daily as needed for indigestion or heartburn.    . Cholecalciferol (VITAMIN D) 2000 units tablet Take 2,000 Units by mouth daily.    . DULoxetine (CYMBALTA) 30 MG capsule Take 30 mg by mouth daily.    [name]. esomeprazole (NEXIUM) 40 MG capsule Take 40 mg by mouth every other day.   10  . ezetimibe (ZETIA) 10 MG tablet Take 1 tablet (10 mg total) by mouth daily. 30 tablet 3  . HYDROcodone-acetaminophen (NORCO/VICODIN) 5-325 MG tablet Take 1 tablet by mouth every 6 (six) hours as needed for moderate pain.   0  . hydrOXYzine (VISTARIL) 50 MG capsule Take 1 capsule by mouth at bedtime as needed (sleep).   0  . losartan (COZAAR) 100 MG tablet Take 100 mg by mouth daily.  3  . metoprolol tartrate (LOPRESSOR) 25 MG  tablet Take 0.5 tablets (12.5 mg total) by mouth 2 (two) times daily. 90 tablet 2  . Multiple Vitamin (MULTIVITAMIN) capsule Take 1 capsule by mouth daily.    . nitroGLYCERIN (NITROSTAT) 0.4 MG SL tablet Place 1 tablet under the tongue as needed for chest pain.     [name]. omega-3 acid ethyl esters (LOVAZA) 1 g capsule Take 1 g by mouth daily.    . Prenatal Vit-Fe Fumarate-FA (PRENATAL MULTIVITAMIN) TABS tablet Take 1 tablet by mouth daily at 12 noon.     No current facility-administered medications for this visit.     Allergies  Allergen Reactions  . Bee Venom Anaphylaxis  . Sulfa Antibiotics Rash and Swelling  . Cephalexin Other (See Comments)    Unknown  . Ciprofloxacin Nausea And Vomiting  . Depakote [Divalproex Sodium] Other (See Comments)    "side effects"  . Risperidone Other (See Comments)    Unknown  . Seroquel [Quetiapine Fumarate] Other (See Comments)    Nightmares  . Statins Other (See Comments)    joint pain, weakness  . Topiramate Other (See Comments)    Nightmares  . Tramadol Other (See Comments)    Serotonin syndrome  . Citalopram Rash    Other reaction(s): insomnia, agitation  . Lamotrigine Itching and  Rash    Other reaction(s): brown urine    Past Medical History:  Diagnosis Date  . Anginal pain (HCC)   . Atrial fibrillation (HCC)   . Coronary artery disease involving native coronary artery with angina pectoris (HCC) 03/24/2015  . History of appendectomy 1963  . Hypertension   . Vitamin D deficiency     There were no vitals taken for this visit.   Hyperlipidemia LDL goal <70 [name] has a [place] Criteria score of 5 (3 for LDL, 2 for age) and based on her cardiac history would benefit from a PCSK-9 inhibitor.   Unfortunately she has insurance thru the [facility], which prohibits the use of manufacturer copays cards for Repatha.  With her plan, the cost of the medication would be prohibitive.  While we can apply to [employer] for patient assistance, their program runs  thru Dec 31.  Because she has not tried ezetimbe, will start her on that for now.  She will repeat labs in early January, at which time, if approved for Repatha, can submit to their patient assistance program.  We can also potentially try Crestor 5 mg weekly to see what her level of tolerance for statins would be.  Patient understands challenges of these high cost medications and is agreeable to this plan.     Phillips Hay PharmD CPP  [facility]

## 2016-05-07 ENCOUNTER — Encounter: Payer: Self-pay | Admitting: Pharmacist Clinician (PhC)/ Clinical Pharmacy Specialist

## 2016-05-07 DIAGNOSIS — E782 Mixed hyperlipidemia: Secondary | ICD-10-CM | POA: Insufficient documentation

## 2016-05-07 HISTORY — DX: Mixed hyperlipidemia: E78.2

## 2016-05-07 NOTE — Assessment & Plan Note (Signed)
[name] has a [place] Criteria score of 5 (3 for LDL, 2 for age) and based on her cardiac history would benefit from a PCSK-9 inhibitor.   Unfortunately [facility], which prohibits the use of manufacturer copays cards for Repatha.  With her plan, the cost of the medication would be prohibitive.  While we can apply to [employer] for patient assistance, their program runs thru Dec 31.  Because she has not tried ezetimbe, will start her on that for now.  She will repeat labs in early January, at which time, if approved for Repatha, can submit to their patient assistance program.  We can also potentially try Crestor 5 mg weekly to see what her level of tolerance for statins would be.  Patient understands challenges of these high cost medications and is agreeable to this plan.

## 2016-05-11 ENCOUNTER — Encounter (HOSPITAL_COMMUNITY)
Admission: RE | Admit: 2016-05-11 | Discharge: 2016-05-11 | Disposition: A | Discharge status: Home / Self Care | Payer: BLUE CROSS/BLUE SHIELD | Source: Ambulatory Visit | Attending: Obstetrics and Gynecology | Admitting: Obstetrics and Gynecology

## 2016-05-11 ENCOUNTER — Other Ambulatory Visit: Payer: Self-pay

## 2016-05-11 ENCOUNTER — Encounter (HOSPITAL_COMMUNITY): Payer: Self-pay | Admitting: *Deleted

## 2016-05-11 DIAGNOSIS — Z01812 Encounter for preprocedural laboratory examination: Secondary | ICD-10-CM | POA: Diagnosis present

## 2016-05-11 DIAGNOSIS — Z01810 Encounter for preprocedural cardiovascular examination: Secondary | ICD-10-CM | POA: Insufficient documentation

## 2016-05-11 HISTORY — DX: Gastro-esophageal reflux disease without esophagitis: K21.9

## 2016-05-11 HISTORY — DX: Bipolar disorder, unspecified: F31.9

## 2016-05-11 HISTORY — DX: Cardiac arrhythmia, unspecified: I49.9

## 2016-05-11 HISTORY — DX: Acute myocardial infarction, unspecified: I21.9

## 2016-05-11 LAB — CBC
HEMATOCRIT: 40.8 % (ref 36.0–46.0)
HEMOGLOBIN: 13.9 g/dL (ref 12.0–15.0)
MCH: 31.7 pg (ref 26.0–34.0)
MCHC: 34.1 g/dL (ref 30.0–36.0)
MCV: 92.9 fL (ref 78.0–100.0)
Platelets: 231 10*3/uL (ref 150–400)
RBC: 4.39 MIL/uL (ref 3.87–5.11)
RDW: 13.5 % (ref 11.5–15.5)
WBC: 4.8 10*3/uL (ref 4.0–10.5)

## 2016-05-11 LAB — BASIC METABOLIC PANEL
ANION GAP: 8 (ref 5–15)
BUN: 12 mg/dL (ref 6–20)
CHLORIDE: 103 mmol/L (ref 101–111)
CO2: 29 mmol/L (ref 22–32)
Calcium: 10.3 mg/dL (ref 8.9–10.3)
Creatinine, Ser: 0.76 mg/dL (ref 0.44–1.00)
GFR calc Af Amer: >60 mL/min (ref >60)
GFR calc non Af Amer: >60 mL/min (ref >60)
GLUCOSE: 108 mg/dL — AB (ref 65–99)
POTASSIUM: 4.9 mmol/L (ref 3.5–5.1)
Sodium: 140 mmol/L (ref 135–145)

## 2016-05-11 NOTE — Pre-Procedure Instructions (Signed)
EKG done today shown to Dr. Acey Lavarignan and he wants patient to have cardiac clearance. I routed the EKG to Dr. Duke Salviaandolph, cardiologist and to Dr. Dion BodyVarnado. I called Myrene in Dr. Ginnie SmartVarnado's office and told her that pt needs clearance and she will handle that with Dr. Dion BodyVarnado.

## 2016-05-11 NOTE — Patient Instructions (Addendum)
Your procedure is scheduled on:05/19/16  Enter through the Main Entrance at :9:15 am Pick up desk phone and dial [phone] and inform us of your arrival.  Please call [phone] if you have any problems the morning of surgery.  Remember: Do not eat food or drink liquids, including water, after midnight:Tuesday   You may brush your teeth the morning of surgery.  Take these meds the morning of surgery with a sip of water: Cymbalta, Zetia, Nexium, Metoprolol, Losartan  DO NOT wear jewelry, eye make-up, lipstick,body lotion, or dark fingernail polish.  (Polished toes are ok) You may wear deodorant.   Patients discharged on the day of surgery will not be allowed to drive home. Wear loose fitting, comfortable clothes for your ride home.   `

## 2016-05-17 ENCOUNTER — Telehealth: Payer: Self-pay | Admitting: Cardiovascular Disease

## 2016-05-17 NOTE — Telephone Encounter (Signed)
New message      Request for surgical clearance:  What type of surgery is being performed? D&C hysteroscopy When is this surgery scheduled? 05-19-16 Are there any medications that need to be held prior to surgery and how long? Please look at EKG from preop and fax them on whether pt can have procedure.  Anesthesiologist did not like the ekg. EKG in epic.  If there is a problem, please call as soon as we can so that they can resc the procedure Name of physician performing surgery?  Dr Dion BodyVarnado What is your office phone and fax number? Fax [phone]

## 2016-05-17 NOTE — Telephone Encounter (Signed)
  [facility] requesting review for procedure clearance Wednesday.  Anesthesiologist wanted cardiology review of recent EKG. Last EKG showed Sinus Brady (59bpm) w RBBB.  Routed to MD for review.

## 2016-05-18 MED ORDER — METRONIDAZOLE IN NACL 5-0.79 MG/ML-% IV SOLN
500.0000 mg | INTRAVENOUS | Status: DC
  Filled 2016-05-18: qty 100

## 2016-05-18 MED ORDER — CIPROFLOXACIN IN D5W 400 MG/200ML IV SOLN
400.0000 mg | INTRAVENOUS | Status: DC
  Filled 2016-05-18: qty 200

## 2016-05-18 NOTE — Telephone Encounter (Signed)
Note sent to Dr Dion BodyVarnado via Epic    Lindsay Siiffany [place], MD  Lindsay BlanksMelinda B [name]        Lindsay [name] was seen one month ago and was doing well. She had a cath last year that showed non-obstructive coronary disease. Unless she has new symptoms of chest pain, she is low risk for surgery despite the LBBB seen on her most recent EKG.   Previous Messages    ----- Message -----  From: Paulene FloorJanet Colborne, RN  Sent: 05/11/2016  4:45 PM  To: Lindsay Siiffany [place], MD   preop EKG- changes noted and Dr. Acey Lavarignan, MDA wants patient to have cardiac clearance prior to surgery.

## 2016-05-19 ENCOUNTER — Ambulatory Visit (HOSPITAL_COMMUNITY): Payer: BLUE CROSS/BLUE SHIELD | Admitting: Anesthesiology

## 2016-05-19 ENCOUNTER — Encounter (HOSPITAL_COMMUNITY): Payer: Self-pay

## 2016-05-19 ENCOUNTER — Ambulatory Visit (HOSPITAL_COMMUNITY)
Admission: RE | Admit: 2016-05-19 | Discharge: 2016-05-19 | Disposition: A | Discharge status: Home / Self Care | Payer: BLUE CROSS/BLUE SHIELD | Source: Ambulatory Visit | Attending: Obstetrics and Gynecology | Admitting: Obstetrics and Gynecology

## 2016-05-19 ENCOUNTER — Encounter (HOSPITAL_COMMUNITY)
Admission: RE | Disposition: A | Discharge status: Home / Self Care | Payer: Self-pay | Source: Ambulatory Visit | Attending: Obstetrics and Gynecology

## 2016-05-19 DIAGNOSIS — I4891 Unspecified atrial fibrillation: Secondary | ICD-10-CM | POA: Diagnosis not present

## 2016-05-19 DIAGNOSIS — F329 Major depressive disorder, single episode, unspecified: Secondary | ICD-10-CM | POA: Insufficient documentation

## 2016-05-19 DIAGNOSIS — I2510 Atherosclerotic heart disease of native coronary artery without angina pectoris: Secondary | ICD-10-CM | POA: Insufficient documentation

## 2016-05-19 DIAGNOSIS — Z79899 Other long term (current) drug therapy: Secondary | ICD-10-CM | POA: Insufficient documentation

## 2016-05-19 DIAGNOSIS — E7800 Pure hypercholesterolemia, unspecified: Secondary | ICD-10-CM | POA: Insufficient documentation

## 2016-05-19 DIAGNOSIS — Z7982 Long term (current) use of aspirin: Secondary | ICD-10-CM | POA: Insufficient documentation

## 2016-05-19 DIAGNOSIS — I10 Essential (primary) hypertension: Secondary | ICD-10-CM | POA: Insufficient documentation

## 2016-05-19 DIAGNOSIS — N840 Polyp of corpus uteri: Secondary | ICD-10-CM | POA: Diagnosis not present

## 2016-05-19 DIAGNOSIS — K219 Gastro-esophageal reflux disease without esophagitis: Secondary | ICD-10-CM | POA: Diagnosis not present

## 2016-05-19 DIAGNOSIS — I252 Old myocardial infarction: Secondary | ICD-10-CM | POA: Insufficient documentation

## 2016-05-19 HISTORY — PX: DILATATION & CURETTAGE/HYSTEROSCOPY WITH MYOSURE: SHX6511

## 2016-05-19 SURGERY — DILATATION & CURETTAGE/HYSTEROSCOPY WITH MYOSURE
Anesthesia: General | Site: Vagina

## 2016-05-19 MED ORDER — HYDROMORPHONE HCL 1 MG/ML IJ SOLN: 0.2500 mg | INTRAMUSCULAR | Status: DC | PRN

## 2016-05-19 MED ORDER — LIDOCAINE HCL 2 % IJ SOLN
INTRAMUSCULAR | Status: AC
  Filled 2016-05-19: qty 20

## 2016-05-19 MED ORDER — ATROPINE SULFATE 0.4 MG/ML IJ SOLN: INTRAMUSCULAR | Status: DC | PRN

## 2016-05-19 MED ORDER — PROPOFOL 10 MG/ML IV BOLUS
INTRAVENOUS | Status: AC
  Filled 2016-05-19: qty 20

## 2016-05-19 MED ORDER — FENTANYL CITRATE (PF) 100 MCG/2ML IJ SOLN
INTRAMUSCULAR | Status: AC
  Filled 2016-05-19: qty 2

## 2016-05-19 MED ORDER — DEXAMETHASONE SODIUM PHOSPHATE 4 MG/ML IJ SOLN
INTRAMUSCULAR | Status: AC
Start: 2016-05-19 — End: 2016-05-19
  Filled 2016-05-19: qty 1

## 2016-05-19 MED ORDER — KETOROLAC TROMETHAMINE 30 MG/ML IJ SOLN
INTRAMUSCULAR | Status: AC
Start: 2016-05-19 — End: 2016-05-19
  Filled 2016-05-19: qty 1

## 2016-05-19 MED ORDER — DEXAMETHASONE SODIUM PHOSPHATE 10 MG/ML IJ SOLN
INTRAMUSCULAR | Status: DC | PRN
  Administered 2016-05-19: 4 mg via INTRAVENOUS

## 2016-05-19 MED ORDER — FENTANYL CITRATE (PF) 100 MCG/2ML IJ SOLN
INTRAMUSCULAR | Status: DC | PRN
  Administered 2016-05-19 (×2): 25 ug via INTRAVENOUS
  Administered 2016-05-19: 50 ug via INTRAVENOUS

## 2016-05-19 MED ORDER — PROPOFOL 10 MG/ML IV BOLUS
INTRAVENOUS | Status: DC | PRN
  Administered 2016-05-19: 200 mg via INTRAVENOUS

## 2016-05-19 MED ORDER — ATROPINE SULFATE 0.4 MG/ML IJ SOLN
INTRAMUSCULAR | Status: DC | PRN
  Administered 2016-05-19 (×2): .1 mg via INTRAVENOUS

## 2016-05-19 MED ORDER — GLYCOPYRROLATE 0.2 MG/ML IJ SOLN
INTRAMUSCULAR | Status: AC
  Filled 2016-05-19: qty 1

## 2016-05-19 MED ORDER — LIDOCAINE HCL 2 % IJ SOLN
INTRAMUSCULAR | Status: DC | PRN
Start: 2016-05-19 — End: 2016-05-19
  Administered 2016-05-19: 10 mL

## 2016-05-19 MED ORDER — EPHEDRINE 5 MG/ML INJ
INTRAVENOUS | Status: AC
  Filled 2016-05-19: qty 10

## 2016-05-19 MED ORDER — ONDANSETRON HCL 4 MG/2ML IJ SOLN
INTRAMUSCULAR | Status: DC | PRN
  Administered 2016-05-19: 4 mg via INTRAVENOUS

## 2016-05-19 MED ORDER — LACTATED RINGERS IV SOLN
INTRAVENOUS | Status: DC
  Administered 2016-05-19: 125 mL/h via INTRAVENOUS
  Administered 2016-05-19: 14:00:00 via INTRAVENOUS

## 2016-05-19 MED ORDER — PHENYLEPHRINE HCL 10 MG/ML IJ SOLN
INTRAMUSCULAR | Status: DC | PRN
  Administered 2016-05-19: 40 ug via INTRAVENOUS

## 2016-05-19 MED ORDER — MIDAZOLAM HCL 2 MG/2ML IJ SOLN
INTRAMUSCULAR | Status: DC | PRN
  Administered 2016-05-19: 2 mg via INTRAVENOUS

## 2016-05-19 MED ORDER — LIDOCAINE HCL (CARDIAC) 20 MG/ML IV SOLN
INTRAVENOUS | Status: AC
  Filled 2016-05-19: qty 5

## 2016-05-19 MED ORDER — DEXAMETHASONE SODIUM PHOSPHATE 10 MG/ML IJ SOLN
INTRAMUSCULAR | Status: AC
  Filled 2016-05-19: qty 1

## 2016-05-19 MED ORDER — ONDANSETRON HCL 4 MG/2ML IJ SOLN
INTRAMUSCULAR | Status: AC
  Filled 2016-05-19: qty 2

## 2016-05-19 MED ORDER — IBUPROFEN 600 MG PO TABS: 600.0000 mg | ORAL_TABLET | Freq: Four times a day (QID) | ORAL | 0 refills | Status: DC | PRN

## 2016-05-19 MED ORDER — LIDOCAINE HCL (CARDIAC) 20 MG/ML IV SOLN
INTRAVENOUS | Status: DC | PRN
  Administered 2016-05-19: 50 mg via INTRAVENOUS

## 2016-05-19 MED ORDER — SODIUM CHLORIDE 0.9 % IR SOLN
Status: DC | PRN
  Administered 2016-05-19: 3000 mL

## 2016-05-19 MED ORDER — PROMETHAZINE HCL 25 MG/ML IJ SOLN: 6.2500 mg | INTRAMUSCULAR | Status: DC | PRN

## 2016-05-19 MED ORDER — KETOROLAC TROMETHAMINE 30 MG/ML IJ SOLN
INTRAMUSCULAR | Status: AC
  Filled 2016-05-19: qty 1

## 2016-05-19 MED ORDER — KETOROLAC TROMETHAMINE 30 MG/ML IJ SOLN
INTRAMUSCULAR | Status: DC | PRN
Start: 2016-05-19 — End: 2016-05-19
  Administered 2016-05-19: 30 mg via INTRAVENOUS

## 2016-05-19 MED ORDER — PHENYLEPHRINE 40 MCG/ML (10ML) SYRINGE FOR IV PUSH (FOR BLOOD PRESSURE SUPPORT)
PREFILLED_SYRINGE | INTRAVENOUS | Status: AC
  Filled 2016-05-19: qty 10

## 2016-05-19 MED ORDER — MIDAZOLAM HCL 2 MG/2ML IJ SOLN
INTRAMUSCULAR | Status: AC
  Filled 2016-05-19: qty 2

## 2016-05-19 MED ORDER — EPHEDRINE SULFATE 50 MG/ML IJ SOLN
INTRAMUSCULAR | Status: DC | PRN
  Administered 2016-05-19 (×2): 5 mg via INTRAVENOUS
  Administered 2016-05-19: 10 mg via INTRAVENOUS

## 2016-05-19 SURGICAL SUPPLY — 19 items
CANISTER SUCT 3000ML (MISCELLANEOUS) ×2 IMPLANT
CATH ROBINSON RED A/P 16FR (CATHETERS) ×2 IMPLANT
CLOTH BEACON ORANGE TIMEOUT ST (SAFETY) ×2 IMPLANT
CONTAINER PREFILL 10% NBF 60ML (FORM) ×4 IMPLANT
DEVICE MYOSURE LITE (MISCELLANEOUS) ×2 IMPLANT
DEVICE MYOSURE REACH (MISCELLANEOUS) IMPLANT
DILATOR CANAL MILEX (MISCELLANEOUS) ×2 IMPLANT
FILTER ARTHROSCOPY CONVERTOR (FILTER) ×2 IMPLANT
GLOVE BIO SURGEON STRL SZ7 (GLOVE) ×2 IMPLANT
GLOVE BIOGEL PI IND STRL 7.0 (GLOVE) ×2 IMPLANT
GLOVE BIOGEL PI INDICATOR 7.0 (GLOVE) ×2
GOWN STRL REUS W/TWL LRG LVL3 (GOWN DISPOSABLE) ×4 IMPLANT
PACK VAGINAL MINOR WOMEN LF (CUSTOM PROCEDURE TRAY) ×2 IMPLANT
PAD OB MATERNITY 4.3X12.25 (PERSONAL CARE ITEMS) ×2 IMPLANT
SEAL ROD LENS SCOPE MYOSURE (ABLATOR) ×2 IMPLANT
TOWEL OR 17X24 6PK STRL BLUE (TOWEL DISPOSABLE) ×4 IMPLANT
TUBING AQUILEX INFLOW (TUBING) ×2 IMPLANT
TUBING AQUILEX OUTFLOW (TUBING) ×2 IMPLANT
WATER STERILE IRR 1000ML POUR (IV SOLUTION) ×2 IMPLANT

## 2016-05-19 NOTE — Discharge Instructions (Addendum)
DISCHARGE INSTRUCTIONS: D&C / D&E °The following instructions have been prepared to help you care for yourself upon your return home. °  °Personal hygiene: °• Use sanitary pads for vaginal drainage, not tampons. °• Shower the day after your procedure. °• NO tub baths, pools or Jacuzzis for 2-3 weeks. °• Wipe front to back after using the bathroom. ° °Activity and limitations: °• Do NOT drive or operate any equipment for 24 hours. The effects of anesthesia are still present and drowsiness may result. °• Do NOT rest in bed all day. °• Walking is encouraged. °• Walk up and down stairs slowly. °• You may resume your normal activity in one to two days or as indicated by your physician. ° °Sexual activity: NO intercourse for at least 2 weeks after the procedure, or as indicated by your physician. ° °Diet: Eat a light meal as desired this evening. You may resume your usual diet tomorrow. ° °Return to work: You may resume your work activities in one to two days or as indicated by your doctor. ° °What to expect after your surgery: Expect to have vaginal bleeding/discharge for 2-3 days and spotting for up to 10 days. It is not unusual to have soreness for up to 1-2 weeks. You may have a slight burning sensation when you urinate for the first day. Mild cramps may continue for a couple of days. You may have a regular period in 2-6 weeks. ° °Call your doctor for any of the following: °• Excessive vaginal bleeding, saturating and changing one pad every hour. °• Inability to urinate 6 hours after discharge from hospital. °• Pain not relieved by pain medication. °• Fever of 100.4° F or greater. °• Unusual vaginal discharge or odor. ° ° Call for an appointment:  ° ° °Patient’s signature: ______________________ ° °Nurse’s signature ________________________ ° °Support person's signature_______________________ ° ° °DISCHARGE INSTRUCTIONS: D&C / D&E °The following instructions have been prepared to help you care for yourself upon your  return home. °  °Personal hygiene: °• Use sanitary pads for vaginal drainage, not tampons. °• Shower the day after your procedure. °• NO tub baths, pools or Jacuzzis for 2-3 weeks. °• Wipe front to back after using the bathroom. ° °Activity and limitations: °• Do NOT drive or operate any equipment for 24 hours. The effects of anesthesia are still present and drowsiness may result. °• Do NOT rest in bed all day. °• Walking is encouraged. °• Walk up and down stairs slowly. °• You may resume your normal activity in one to two days or as indicated by your physician. ° °Sexual activity: NO intercourse for at least 2 weeks after the procedure, or as indicated by your physician. ° °Diet: Eat a light meal as desired this evening. You may resume your usual diet tomorrow. ° °Return to work: You may resume your work activities in one to two days or as indicated by your doctor. ° °What to expect after your surgery: Expect to have vaginal bleeding/discharge for 2-3 days and spotting for up to 10 days. It is not unusual to have soreness for up to 1-2 weeks. You may have a slight burning sensation when you urinate for the first day. Mild cramps may continue for a couple of days. You may have a regular period in 2-6 weeks. ° °Call your doctor for any of the following: °• Excessive vaginal bleeding, saturating and changing one pad every hour. °• Inability to urinate 6 hours after discharge from hospital. °• Pain not relieved by   pain medication.  Fever of 100.4 F or greater.  Unusual vaginal discharge or odor.   Call for an appointment:    Patients signature: ______________________  Nurses signature ________________________  Support person's signature_______________________   Hysteroscopy, Care After Refer to this sheet in the next few weeks. These instructions provide you with information on caring for yourself after your procedure. Your health care provider may also give you more specific instructions. Your  treatment has been planned according to current medical practices, but problems sometimes occur. Call your health care provider if you have any problems or questions after your procedure.  WHAT TO EXPECT AFTER THE PROCEDURE After your procedure, it is typical to have the following:  You may have some cramping. This normally lasts for a couple days.  You may have bleeding. This can vary from light spotting for a few days to menstrual-like bleeding for 3-7 days. HOME CARE INSTRUCTIONS  Rest for the first 1-2 days after the procedure.  Only take over-the-counter or prescription medicines as directed by your health care provider. Do not take aspirin. It can increase the chances of bleeding.  Take showers instead of baths for 2 weeks or as directed by your health care provider.  Do not drive for 24 hours or as directed.  Do not drink alcohol while taking pain medicine.  Do not use tampons, douche, or have sexual intercourse for 2 weeks or until your health care provider says it is okay.  Take your temperature twice a day for 4-5 days. Write it down each time.  Follow your health care provider's advice about diet, exercise, and lifting.  If you develop constipation, you may:  Take a mild laxative if your health care provider approves.  Add bran foods to your diet.  Drink enough fluids to keep your urine clear or pale yellow.  Try to have someone with you or available to you for the first 24-48 hours, especially if you were given a general anesthetic.  Follow up with your health care provider as directed. SEEK MEDICAL CARE IF:  You feel dizzy or lightheaded.  You feel sick to your stomach (nauseous).  You have abnormal vaginal discharge.  You have a rash.  You have pain that is not controlled with medicine. SEEK IMMEDIATE MEDICAL CARE IF:  You have bleeding that is heavier than a normal menstrual period.  You have a fever.  You have increasing cramps or pain, not  controlled with medicine.  You have new belly (abdominal) pain.  You pass out.  You have pain in the tops of your shoulders (shoulder strap areas).  You have shortness of breath.   This information is not intended to replace advice given to you by your health care provider. Make sure you discuss any questions you have with your health care provider.   Document Released: 05/02/2013 Document Reviewed: 05/02/2013 Elsevier Interactive Patient Education [employer]2016 Elsevier Inc.

## 2016-05-19 NOTE — H&P (Signed)
History of Present Illness  General:  Lindsay [name] with h/o endocervical mass that was thought to be a large endocervical polyp. An office cervical polylpectomy was attempted but aborted when Lindsay [name] started feeling intense pain in her "womb." Biopsy was done b/c it was considered that it might be a fibroid. Biopsy showed that it was an endometrial polyp. Last weekend Lindsay [name] started having pain and bleeding. Passed a fingertip size of tissue. Lindsay [name] strained with BM and passed a sheeath like lesion. She bled a few days, changed a thin pad once a day. Today bleeding is almost gone.   Current Medications  Taking   Cymbalta(DULoxetine HCl) 30mg  capsule TAKE ONE CAPSULE BY MOUTH EVERY DAY   Omega 3 1000 MG Capsule 1 tablet Orally Once a day   Aspirin 81 MG Tablet 1 tablet Orally Once a day   Vitamin D3 5000 UNIT Capsule 1 capsule Orally Once a day   Cymbalta(DULoxetine HCl) 30 MG Capsule Delayed Release Particles 1 capsule Orally daily   OTC tablet Health Pack Mega Antioxidants 1 tablet oral two times daily   Prenatal One Daily Tablet 1 tablet Orally once daily   Vitamin Mixture - Liquid 1 serving Yougurt or Kefir Orally once daily   Nitroglycerin 0.4 MG Tablet Sublingual 1 tablet under the tongue Sublingual may repeat x2 prn   Metoprolol Tartrate 25 MG Tablet 1/2 tablet with food Orally Twice a day   Losartan Potassium 100 MG Tablet TAKE 1 TABLET BY MOUTH EVERY DAY   Premarin(Estrogens Conjugated) 0.625 MG/GM Cream 0.5 gram Vaginal Twice weekly x 6 weeks then prn   Xanax(ALPRAZolam) 0.25 MG Tablet 1 tablet Orally 1 tab 30 min to 1 hour prior to procedure., Notes: prn   Vistaril(HydrOXYzine HCl) 50 MG Capsule 1 capsule as needed Orally every 6 hrs at night   Tums(Calcium Carbonate Antacid) 500 MG Tablet Chewable 2 tablets Orally Once a day at bedtime when not taking omeprazole   Medication List reviewed and reconciled with the patient    Past Medical History  Hypertension.   Chronic folliculitis.   Irritable  bowel syndrome.   Hypercholesterolemia.   Change in sense of taste and smell.   Finger twitching and numbness.   GERD.   Noncardiac chest pain - 2010.   Chronic depression.   DJD.   Snoring.   Abnormal LFTs secondary to EtOH.   Postmenopausal.   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   Episodic right lower extremity sciatica.   Low Back Pain and LLE Sciatica.   abdominal pain syndrome, 8, 2014, possible UTI, walk-in clinic, [facility].   MI - Suspected in 2010.   hospital [facility]emesis[facility] [place]   nonocclusive coronary artery disease, triple vessel, March 25, 2015, Dr. Herbie BaltimoreHarding - improved on metoprolol.   Upper endoscopy 04/29/2015 - Dr. Wandalee FerdinandSam Ganem - mod. HH and normal otherwise - bxs etc..           Surgical History  Appendectomy 1963  Uretral dilation x2 1965  Follicle in eyelid removed   Enlarged vein removed from lip   Endoscopy - Dr. Sherin QuarryWeissman 2005?  cardiac catheterization with nonocclusive coronary artery disease, triple vessel, Dr. Herbie BaltimoreHarding 03/25/2015   Family History  Father: alive 3792 yrs, ASHD status post PTCA, varicose veins and DJD, diagnosed with CAD  Mother: deceased 6997 yrs, Dementia, diverticulosis, fractured back, breast cancer, hypertension and arthritis,  diagnosed with HTN, Breast Ca  Brother 1: alive, Mental retardation, aspiration pneumonia, cerebral palsy  No family hx of colon cancer/polyps or liver disease.   Social History  General:  Tobacco use  cigarettes: Never smoked Tobacco history last updated 04/23/2016 Additional Findings: Tobacco Non-User Non-smoker for personal reasons no EXPOSURE TO PASSIVE SMOKE.  no Alcohol, sober for the past 6 years.  Caffeine: yes.  no Recreational drug use.  Marital Status: single.  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  no Smoking.  Patient is a native of [place] and moved back to [place] in 1967.    Gyn History  Sexual activity not currently sexually active.  Periods : postmenopausal.  LMP 2006.  Last pap smear date 02/2016, all negative.  Last mammogram date 12/18/2015.    OB History  Never been pregnant per patient.    Allergies  Celexa: insomnia, agitation, rash: Allergy  Keflex: Side Effects  bee stings: swelling: Allergy  Sulfa drugs (for allergy): rash: Allergy  Lamictal: rash, itching, brown urine: Allergy  Topamax: nightmares: Side Effects  Risperdal: unknown  tramadol and cymbalta: Serotonin syndrome: Contraindication  Depakote: "side effects": Side Effects  statins: joint pain, weakness: Side Effects  Seroquel: nightmares: Side Effects   Hospitalization/Major Diagnostic Procedure  Not in the past year 01/2015  Suspected MI 2010  Cardiac 02/2015   Vital Signs  Wt 179.2, Wt change -.2 lb, Ht 63, BMI 31.74, Pulse sitting 79, BP sitting 114/64.   Physical Examination  GENERAL:  Patient appears alert and oriented.  General Appearance: well-appearing, well-developed, no acute distress.  Speech: clear.  FEMALE GENITOURINARY:  Cervix visualized, healthy appearing, no discharge, polyp still present, not actively bleeding, bruised.  Vagina: atrophic mucosa, , no lesions, no abnormal discharge.  Vulva: normal, no lesions, no skin discoloration.     Assessments   1. Endometrial polyp - N84.0 (Primary)   2. Vaginal atrophy - N95.2   Treatment  1. Endometrial polyp  Notes: Proceed with Hysteroscopic resection of polyp.    2. Vaginal atrophy  Continue Premarin Cream, 0.625 MG/GM, 0.5 gram, Vaginal, Twice weekly x 6 weeks then prn

## 2016-05-19 NOTE — Interval H&P Note (Signed)
History and Physical Interval Note:  05/19/2016 11:30 AM  [name]  has presented today for surgery, with the diagnosis of N95.9 PMB  The various methods of treatment have been discussed with the patient and family. After consideration of risks, benefits and other options for treatment, the patient has consented to  Procedure(s) with comments: DILATATION & CURETTAGE/HYSTEROSCOPY WITH MYOSURE (N/A) - Possible Myosure for polyps. as a surgical intervention .  The patient's history has been reviewed, patient examined, no change in status, stable for surgery.  I have reviewed the patient's chart and labs.  Questions were answered to the patient's satisfaction.    Pt has stopped bleeding Lindsay [name]

## 2016-05-19 NOTE — Transfer of Care (Signed)
Immediate Anesthesia Transfer of Care Note  Patient: Lindsay [name]  Procedure(s) Performed: Procedure(s) with comments: DILATATION & CURETTAGE/HYSTEROSCOPY WITH MYOSURE (N/A) - Possible Myosure for polyps.  Patient Location: PACU  Anesthesia Type:General  Level of Consciousness: awake, alert , oriented and patient cooperative  Airway & Oxygen Therapy: Patient Spontanous Breathing and Patient connected to nasal cannula oxygen  Post-op Assessment: Report given to RN and Post -op Vital signs reviewed and stable  Post vital signs: Reviewed and stable  Last Vitals:  Vitals:   05/19/16 0936  BP: (!) 129/93  Pulse: 65  Resp: 16  Temp: 36.7 C    Last Pain:  Vitals:   05/19/16 0936  TempSrc: Oral  PainSc: 2       Patients Stated Pain Goal: 5 (05/19/16 0936)  Complications: No apparent anesthesia complications

## 2016-05-19 NOTE — Anesthesia Postprocedure Evaluation (Signed)
Anesthesia Post Note  Patient: Lindsay [name]  Procedure(s) Performed: Procedure(s) (LRB): DILATATION & CURETTAGE/HYSTEROSCOPY WITH MYOSURE (N/A)  Patient location during evaluation: PACU Anesthesia Type: General Level of consciousness: awake and alert Pain management: pain level controlled Vital Signs Assessment: post-procedure vital signs reviewed and stable Respiratory status: spontaneous breathing, nonlabored ventilation, respiratory function stable and patient connected to nasal cannula oxygen Cardiovascular status: blood pressure returned to baseline and stable Postop Assessment: no signs of nausea or vomiting Anesthetic complications: no     Last Vitals:  Vitals:   05/19/16 1600 05/19/16 1615  BP: 119/69 121/80  Pulse: 66 64  Resp: 10 12  Temp:  36.7 C    Last Pain:  Vitals:   05/19/16 1615  TempSrc: Oral  PainSc:    Pain Goal: Patients Stated Pain Goal: 5 (05/19/16 0936)               Ilhan Debenedetto JENNETTE

## 2016-05-19 NOTE — Anesthesia Procedure Notes (Signed)
Procedure Name: LMA Insertion Date/Time: 05/19/2016 2:04 PM Performed by: Cleda ClarksBROWDER, Lauralyn Shadowens R Pre-anesthesia Checklist: Patient identified, Timeout performed, Emergency Drugs available, Suction available and Patient being monitored Patient Re-evaluated:Patient Re-evaluated prior to inductionOxygen Delivery Method: Circle system utilized Preoxygenation: Pre-oxygenation with 100% oxygen Intubation Type: IV induction Ventilation: Mask ventilation without difficulty LMA: LMA inserted LMA Size: 4.0 Tube type: Oral Number of attempts: 1 Placement Confirmation: positive ETCO2 and breath sounds checked- equal and bilateral Tube secured with: Tape Dental Injury: Teeth and Oropharynx as per pre-operative assessment

## 2016-05-19 NOTE — Anesthesia Preprocedure Evaluation (Addendum)
Anesthesia Evaluation  Patient identified by MRN, date of birth, ID band Patient awake    Reviewed: Allergy & Precautions, NPO status , Patient's Chart, lab work & pertinent test results, reviewed documented beta blocker date and time   History of Anesthesia Complications Negative for: history of anesthetic complications  Airway Mallampati: II  TM Distance: >3 FB Neck ROM: Full    Dental no notable dental hx. (+) Dental Advisory Given   Pulmonary neg pulmonary ROS,    Pulmonary exam normal        Cardiovascular hypertension, Pt. on medications and Pt. on home beta blockers (-) angina+ CAD and + Past MI  Normal cardiovascular exam+ dysrhythmias Atrial Fibrillation      Neuro/Psych PSYCHIATRIC DISORDERS Bipolar Disorder negative neurological ROS     GI/Hepatic Neg liver ROS, GERD  Medicated,  Endo/Other  negative endocrine ROS  Renal/GU negative Renal ROS     Musculoskeletal   Abdominal   Peds  Hematology   Anesthesia Other Findings   Reproductive/Obstetrics                           Anesthesia Physical Anesthesia Plan  ASA: II  Anesthesia Plan: General   Post-op Pain Management:    Induction: Intravenous  Airway Management Planned: Oral ETT  Additional Equipment:   Intra-op Plan:   Post-operative Plan: Extubation in OR  Informed Consent: I have reviewed the patients History and Physical, chart, labs and discussed the procedure including the risks, benefits and alternatives for the proposed anesthesia with the patient or authorized representative who has indicated his/her understanding and acceptance.   Dental advisory given  Plan Discussed with: CRNA and Anesthesiologist  Anesthesia Plan Comments:         Anesthesia Quick Evaluation

## 2016-05-20 ENCOUNTER — Encounter (HOSPITAL_COMMUNITY): Payer: Self-pay | Admitting: Obstetrics and Gynecology

## 2016-05-24 NOTE — Brief Op Note (Signed)
05/19/2016  10:38 AM  PATIENT:  Lindsay [name]  60 y.o. female  PRE-OPERATIVE DIAGNOSIS:  Endometrial polyp  POST-OPERATIVE DIAGNOSIS:  Same  PROCEDURE:  Procedure(s) with comments: DILATATION & CURETTAGE/HYSTEROSCOPY WITH MYOSURE (N/A) - Possible Myosure for polyps.  SURGEON:  Surgeon(s) and Role:    * Geryl RankinsEvelyn Brenda Cowher, MD - Primary  PHYSICIAN ASSISTANT:   ASSISTANTS: None   ANESTHESIA:   local and general  EBL:  No intake/output data recorded.  BLOOD ADMINISTERED:none  DRAINS: none   LOCAL MEDICATIONS USED:  2% LIDOCAINE  and Amount: 10 ml  SPECIMEN:  Source of Specimen:  Endometrial polyp and endometrial currettings  DISPOSITION OF SPECIMEN:  PATHOLOGY  COUNTS:  YES  TOURNIQUET:  * No tourniquets in log *  DICTATION: .Other Dictation: Dictation Number [id number]  PLAN OF CARE: Discharge to home after PACU  PATIENT DISPOSITION:  PACU - hemodynamically stable.   Delay start of Pharmacological VTE agent (>24hrs) due to surgical blood loss or risk of bleeding: not applicable

## 2016-05-25 NOTE — Op Note (Signed)
[name]:  Lindsay [name], Lindsay [name]                   ACCOUNT NO.:  [id number]  MEDICAL RECORD NO.:  [id number]  LOCATION:  [facility]                          [facility]ACILITY:  [facility]  PHYSICIAN:  Pieter PartridgeEvelyn B Makailyn Mccormick, MD   DATE OF BIRTH:  1956-05-23  DATE OF PROCEDURE:  05/19/2016 DATE OF DISCHARGE:  05/19/2016                              OPERATIVE REPORT   PREOPERATIVE DIAGNOSIS:  Endometrial polyp.  POSTOPERATIVE DIAGNOSIS:  Endometrial polyp.  PROCEDURE:  Hysteroscopy, D and C with MyoSure.  SURGEON:  Lindsay Nevinvelyn B. Dion BodyVarnado, MD.  ASSISTANT:  None.  ANESTHESIA:  Local and general.  DRAINS:  None.  Local 2% lidocaine, 10 mL.  SPECIMENS:  Endometrial polyp and endometrial curettings.  DISPOSITION OF SPECIMENS:  To pathology.  PATIENT DISPOSITION:  To PACU, hemodynamically stable.  COMPLICATIONS:  None.  FINDINGS:  Large 2 cm endometrial polyp, previously had degenerated extending into the lower uterine segment.  Normal-appearing uterine cavity, os.  INDICATIONS:  Ms. Lindsay Lovelessnna Hanssen is a 60 year old, who presented to my office for routine GYN visit after a long absence from gynecologic care.  On exam, it was noted that she had an endometrial polyp which initially was thought to be an endocervical polyp.  I attempted to do a polypectomy in the office when the patient had extensive uterine cramping.  The polyp was biopsied and was noted to be endometrium.  Due to significant vaginal atrophy and pain with the procedure, the patient and I decided to proceed to the operating room for sedation to remove the polyp.  PROCEDURE IN DETAIL:  The patient was identified in the holding area. She was then taken to the operating room in stable condition.  She was placed in dorsal lithotomy position and underwent general anesthesia without complications.  She was not given preop antibiotics due to history of several allergies and the nature of the procedure.  A speculum was placed in the vagina.  A Graves was placed in the  vagina initially and then a long Peterson's.  The cervix was identified.  A paracervical block was performed with 10 mL of lidocaine.  Initially, the polyp was grasped with ring forceps and twisted off but the stalk was not seen.  Cervix was then dilated up to a 6 and under hysteroscopic guidance, I saw the remainder of the polyp and that was removed with the MyoSure.  I then looked in the endocavity and appeared normal.  The camera and MyoSure were removed.  A sharp curettage of the uterus was formed.  Scant tissue was returned.  All instruments were removed from the uterus and cervix.  Tenaculum site was hemostatic.  The speculum was removed from the vagina.  The patient tolerated the procedure well.  All instrument and sponge counts were correct x3.  SCDs were on and operating throughout the entire procedure.  A time-out was taken prior to the beginning of the procedure.     Pieter PartridgeEvelyn B Obed Samek, MD     EBV/MEDQ  D:  05/24/2016  T:  05/25/2016  Job:  [id number]

## 2016-07-06 ENCOUNTER — Telehealth: Payer: Self-pay | Admitting: Cardiovascular Disease

## 2016-07-06 DIAGNOSIS — E785 Hyperlipidemia, unspecified: Secondary | ICD-10-CM

## 2016-07-06 NOTE — Telephone Encounter (Signed)
Patient saw Dr. Duke Salviaandolph in Sept and was prescribed Zetia.  She said that Dr. Duke Salviaandolph wanted to check her cholesterol again since taking the medicine. There are no orders in chart to have the lab work done. Please call patient when orders are in so she can schedule the lab appt.

## 2016-07-06 NOTE — Telephone Encounter (Signed)
[facility] and we will see her a few days later.  Will try later to reach her to set appt for lipid clinic.

## 2016-07-06 NOTE — Telephone Encounter (Signed)
Please review. Note she was seen in lipid clinic on 10/12 to start Zetia. I do not see lab orders or notes regarding recommendation for cholesterol recheck.

## 2016-07-08 NOTE — Telephone Encounter (Signed)
LMOM asking patient to call and schedule lipid visit

## 2016-08-02 ENCOUNTER — Other Ambulatory Visit: Payer: Self-pay | Admitting: Cardiovascular Disease

## 2016-08-02 NOTE — Telephone Encounter (Signed)
Please review for refill. Thanks!  

## 2016-08-02 NOTE — Telephone Encounter (Signed)
Rx request sent to pharmacy.  

## 2017-09-05 ENCOUNTER — Other Ambulatory Visit: Payer: Self-pay | Admitting: *Deleted

## 2017-09-05 MED ORDER — METOPROLOL TARTRATE 25 MG PO TABS: ORAL_TABLET | ORAL | 2 refills | Status: DC

## 2017-11-03 ENCOUNTER — Telehealth: Payer: Self-pay

## 2017-11-03 NOTE — Telephone Encounter (Signed)
    [facility] Pre-operative Risk Assessment    Request for surgical clearance:  1. What type of surgery is being performed? Wisdom tooth Surgically extracted  2. When is this surgery scheduled? TBD   3. What type of clearance is required (medical clearance vs. Pharmacy clearance to hold med vs. Both)? BOTH  4. Are there any medications that need to be held prior to surgery and how long? ASA  5. Practice name and name of physician performing surgery? Dr[facility]  6. What is your office phone and fax number? [phone] Fax [phone]   7. Anesthesia type (None, local, MAC, general) ? Local & N2O   Meryl Crutch 11/03/2017, 4:58 PM  _________________________________________________________________   (provider comments below)

## 2017-11-07 NOTE — Telephone Encounter (Signed)
   Primary Cardiologist:Tiffany Duke Salviaandolph, MD  Chart reviewed as part of pre-operative protocol coverage. Because of Yves Dillnna Rickman's past medical history and time since last visit, last seen in 03/2016, he/she will require a follow-up visit in order to better assess preoperative cardiovascular risk.  Pre-op covering staff: - Please schedule appointment and call patient to inform them. - Please contact requesting surgeon's office via preferred method (i.e, phone, fax) to inform them of need for appointment prior to surgery.  Berton BonJanine Vikkie Goeden, NP  11/07/2017, 2:53 PM

## 2017-11-07 NOTE — Telephone Encounter (Signed)
Pt called back and scheduled appt with Azalee CourseHao Meng, PA-C on 4/16

## 2017-11-07 NOTE — Telephone Encounter (Signed)
Left message for pt to call back and make an appt to be cleared for her upcoming surgery.

## 2017-11-08 ENCOUNTER — Encounter: Payer: Self-pay | Admitting: Physician Assistant

## 2017-11-08 ENCOUNTER — Ambulatory Visit (INDEPENDENT_AMBULATORY_CARE_PROVIDER_SITE_OTHER): Payer: Self-pay | Admitting: Physician Assistant

## 2017-11-08 VITALS — BP 138/77 | HR 77 | Ht 62.5 in | Wt 195.8 lb

## 2017-11-08 DIAGNOSIS — I10 Essential (primary) hypertension: Secondary | ICD-10-CM

## 2017-11-08 DIAGNOSIS — I480 Paroxysmal atrial fibrillation: Secondary | ICD-10-CM

## 2017-11-08 DIAGNOSIS — Z01810 Encounter for preprocedural cardiovascular examination: Secondary | ICD-10-CM

## 2017-11-08 DIAGNOSIS — I25118 Atherosclerotic heart disease of native coronary artery with other forms of angina pectoris: Secondary | ICD-10-CM

## 2017-11-08 MED ORDER — NITROGLYCERIN 0.4 MG SL SUBL: 0.4000 mg | SUBLINGUAL_TABLET | SUBLINGUAL | 99 refills | Status: DC | PRN

## 2017-11-08 NOTE — Patient Instructions (Addendum)
Medication Instructions:  Continue to hold your Aspirin until surgeon says ok to resume   Labwork: [facility]ING L[facility]ID AT [facility], FAX RESULTS TO [phone]   Testing/Procedures: NONE  Follow-Up: Your physician recommends that you schedule a follow-up appointment in: 4-6 MONTHS WITH DR [facility]   Any Other Special Instructions Will Be Listed Below (If Applicable). IF YOUR CHEST PAIN GETS WORSE, INCREASES IN FREQUENCY, LASTS LONGER, OR HAVE AT REST CALL THE OFFICE   YOU HAVE BEEN CLEARED FOR UPCOMING SURGERY   If you need a refill on your cardiac medications before your next appointment, please call your pharmacy.

## 2017-11-08 NOTE — Progress Notes (Signed)
Cardiology Office Note    Date:  11/10/2017   ID:  Lindsay [name], DOB 05-30-1956, MRN [id number]  PCP:  Lindsay Noble, MD  Cardiologist:  Dr. Duke [name]  Chief Complaint  Patient presents with  . Follow-up    seen for Dr. Duke [name], last visit 2017  . Pre-op Exam    requested by Dr. Beather [name] and Dr. Perlie [name] prior to dental extraction    History of Present Illness:  Lindsay [name] is a 62 y.o. female with PMH of HTN, CAD s/p MI 2010, atrial fibrillation.  Echocardiogram on 12/16/2008 showed EF 55-60%, mild LVH, mildly dilated RV and RA.  She underwent cardiac catheterization in August 2016 revealing 50% distal RCA lesion but otherwise no minimal CAD.  Her last office visit with Dr. Duke [name] was in September 2017, she was doing well at the time.  She has a history of statin intolerance.  Her lipid panel obtained on 04/02/2016 showed cholesterol 320, LDL 201, triglyceride 222, HDL 75.  She was referred to the lipid clinic, and was seen by Lindsay [name] on 05/06/2016.  Although, ideally PCSK 9 inhibitor should be used, however the cost was prohibitive for her, 10 mg daily of Zetia was added while our clinical pharmacist and attempt to apply for medication assistance program for Repatha.  She underwent dilatation and curettage by her OB/GYN physician in October 2017 without any issue.  Patient presents today for her yearly visit and she also has upcoming wisdom tooth extraction by Dr. Beather [name] and Dr. Perlie [name].  Aspirin will need to be held.  Patient presents today for preoperative clearance prior to her wisdom tooth extraction.  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  She is no longer working at [employer] and currently works at [employer] at [employer].  This is the same chest pain she had prior to the last cardiac catheterization.  The frequency and duration has not changed.  She says, she has not been exercising for the  past year and a half.  Given the lack of change with regard to the chest pain, I will hold off on further ischemic workup.  The dental extraction is a very low risk procedure, patient may proceed without any further workup.  She has been instructed to contact cardiology if her chest pain become more frequent, last longer or occurs at rest.  Otherwise she has nitroglycerin at home.  She also find out her workplace to free cholesterol checkups, she will check her cholesterol prior to her next visit with Dr. Duke [name].  She also asked me to take a look at the nodules in her finger joints, I think she likely has rheumatoid arthritis, she need will need to be seen by her primary care doctor.  Apparently she has not seen Dr. Kevan [name] since 2016.   Past Medical History:  Diagnosis Date  . Anginal pain (HCC)   . Atrial fibrillation (HCC)   . Bipolar disorder (HCC)   . Coronary artery disease involving native coronary artery with angina pectoris (HCC) 03/24/2015  . Dysrhythmia   . GERD (gastroesophageal reflux disease)   . History of appendectomy 1963  . Hypertension   . Myocardial infarction (HCC)    per pt due to Vit D deficiency  . Vitamin D deficiency     Past Surgical History:  Procedure Laterality Date  . APPENDECTOMY    . CARDIAC CATHETERIZATION N/A 03/25/2015   Procedure: Left  Heart Cath and Coronary Angiography;  Surgeon: Lindsay Lex, MD;  Location[facility] INVASIVE CV LAB;  Service: Cardiovascular;  Laterality: N/A;  . DILATATION & CURETTAGE/HYSTEROSCOPY WITH MYOSURE N/A 05/19/2016   Procedure: DILATATION & CURETTAGE/HYSTEROSCOPY WITH MYOSURE;  Surgeon: Lindsay Rankins, MD;  Location: [facility] ORS;  Service: Gynecology;  Laterality: N/A;  Possible Myosure for polyps.  [name] EYE SURGERY    . MOUTH SURGERY    . URETHRAL DILATION      Current Medications: Outpatient Medications Prior to Visit  Medication Sig Dispense Refill  . aspirin EC 81 MG tablet Take 81 mg by mouth daily.    . calcium carbonate  (TUMS - DOSED IN MG ELEMENTAL CALCIUM) 500 MG chewable tablet Chew 1 tablet by mouth 3 (three) times daily as needed for indigestion or heartburn.    . Cholecalciferol (VITAMIN D) 2000 units tablet Take 2,000 Units by mouth daily.    . DULoxetine (CYMBALTA) 30 MG capsule Take 30 mg by mouth daily.    [name] esomeprazole (NEXIUM) 40 MG capsule Take 40 mg by mouth every other day.   10  . ezetimibe (ZETIA) 10 MG tablet Take 1 tablet (10 mg total) by mouth daily. 30 tablet 3  . HYDROcodone-acetaminophen (NORCO/VICODIN) 5-325 MG tablet Take 1 tablet by mouth every 6 (six) hours as needed for moderate pain.   0  . hydrOXYzine (VISTARIL) 50 MG capsule Take 1 capsule by mouth at bedtime as needed (sleep).   0  . ibuprofen (ADVIL,MOTRIN) 600 MG tablet Take 1 tablet (600 mg total) by mouth every 6 (six) hours as needed. 30 tablet 0  . losartan (COZAAR) 100 MG tablet Take 100 mg by mouth daily.  3  . metoprolol tartrate (LOPRESSOR) 25 MG tablet TAKE 1/2 TAB (12.5 MG TOTAL) BY MOUTH 2 (TWO) TIMES DAILY. 30 tablet 2  . Multiple Vitamin (MULTIVITAMIN) capsule Take 1 capsule by mouth daily.    [name] omega-3 acid ethyl esters (LOVAZA) 1 g capsule Take 1 g by mouth daily.    . Prenatal Vit-Fe Fumarate-FA (PRENATAL MULTIVITAMIN) TABS tablet Take 1 tablet by mouth daily at 12 noon.    . nitroGLYCERIN (NITROSTAT) 0.4 MG SL tablet Place 1 tablet under the tongue as needed for chest pain.      No facility-administered medications prior to visit.      Allergies:   Bee venom; Sulfa antibiotics; Cephalexin; Ciprofloxacin; Depakote [divalproex sodium]; Risperidone; Seroquel [quetiapine fumarate]; Statins; Topiramate; Tramadol; Citalopram; and Lamotrigine   Social History   Socioeconomic History  . Marital status: Single    Spouse name: Not on file  . Number of children: Not on file  . Years of education: Not on file  . Highest education level: Not on file  Occupational History  . Not on file  Social Needs  . Financial  resource strain: Not on file  . Food insecurity:    Worry: Not on file    Inability: Not on file  . Transportation needs:    Medical: Not on file    Non-medical: Not on file  Tobacco Use  . Smoking status: Never Smoker  . Smokeless tobacco: Never Used  Substance and Sexual Activity  . Alcohol use: Yes    Alcohol/week: 1.2 oz    Types: 2 Standard drinks or equivalent per week    Comment: rarely  . Drug use: No  . Sexual activity: Not on file  Lifestyle  . Physical activity:    Days per week: Not on file  Minutes per session: Not on file  . Stress: Not on file  Relationships  . Social connections:    Talks on phone: Not on file    Gets together: Not on file    Attends religious service: Not on file    Active member of club or organization: Not on file    Attends meetings of clubs or organizations: Not on file    Relationship status: Not on file  Other Topics Concern  . Not on file  Social History Narrative  . Not on file     Family History:  The patient's family history includes Dementia in her mother; Heart attack in her maternal grandfather, maternal grandmother, and paternal grandfather; Kidney failure in her paternal grandmother; Vascular Disease in her mother.   ROS:   Please see the history of present illness.    ROS All other systems reviewed and are negative.   PHYSICAL EXAM:   VS:  BP 138/77   Pulse 77   Ht 5' 2.5" (1.588 m)   Wt 195 lb 12.8 oz (88.8 kg)   BMI 35.24 kg/m    GEN: Well nourished, well developed, in no acute distress  HEENT: normal  Neck: no JVD, carotid bruits, or masses Cardiac: RRR; no murmurs, rubs, or gallops,no edema  Respiratory:  clear to auscultation bilaterally, normal work of breathing GI: soft, nontender, nondistended, + BS MS: no deformity or atrophy  Skin: warm and dry, no rash Neuro:  Alert and Oriented x 3, Strength and sensation are intact Psych: euthymic mood, full affect  Wt Readings from Last 3 Encounters:    11/08/17 195 lb 12.8 oz (88.8 kg)  05/11/16 180 lb (81.6 kg)  04/02/16 180 lb 9.6 oz (81.9 kg)      Studies/Labs Reviewed:   EKG:  EKG is ordered today.  The ekg ordered today demonstrates normal sinus rhythm without significant ST-T wave changes  Recent Labs: No results found for requested labs within last 8760 hours.   Lipid Panel    Component Value Date/Time   CHOL 320 (H) 04/02/2016 1554   TRIG 222 (H) 04/02/2016 1554   HDL 75 04/02/2016 1554   CHOLHDL 4.3 04/02/2016 1554   VLDL 44 (H) 04/02/2016 1554   LDLCALC 201 (H) 04/02/2016 1554    Additional studies/ records that were reviewed today include:   TTE 12/16/08: LVEF 55-60%.  Mild LVH.  RV and RA  mildly dilated.   Cath 03/25/2015 Conclusion   1. Prox RCA lesion, 20% stenosed. 2. Dist RCA lesion, 50% stenosed. 3. Ost 2nd Diag to 2nd Diag lesion, 30% stenosed. 4. The left ventricular systolic function is normal.    Angiographically only minimal CAD in a moderate caliber RCA with very small caliber rPDA. Consider Coronary Vasospasm as a potential etiology.  Plan:  Post Radial Cath TR Band removal  D/C later today following Bed Rest  Start IMDUR 30 mg.       ASSESSMENT:    1. Preop cardiovascular exam   2. Coronary artery disease involving native coronary artery of native heart with other form of angina pectoris (HCC)   3. Essential hypertension   4. PAF (paroxysmal atrial fibrillation) (HCC)      PLAN:  In order of problems listed above:  5. Preoperative clearance: She has dental procedure tomorrow, she has been holding her aspirin for the past 2 weeks.  She continued to have chest discomfort, however this is the same chest discomfort she had prior to the last cardiac  catheterization.  There has been no increase in frequency or duration.  No further workup is needed.  6. CAD: Resume aspirin after dental procedure.  Unable to take statins due to intolerance  7. Hyperlipidemia: She will need a  lipid panel, she obtains this at work for free.  She will need to send over her record to cardiology service.  She was unable to afford Repatha.  She is intolerant to multiple statins.  8. Hypertension: Blood pressure stable.  Continue losartan.  9. PAF: She is maintaining sinus rhythm based on today's EKG.    Medication Adjustments/Labs and Tests Ordered: Current medicines are reviewed at length with the patient today.  Concerns regarding medicines are outlined above.  Medication changes, Labs and Tests ordered today are listed in the Patient Instructions below. Patient Instructions  Medication Instructions:  Continue to hold your Aspirin until surgeon says ok to resume   Labwork: FASTING LIPID AT [facility], FAX RESULTS TO [phone]   Testing/Procedures: NONE  Follow-Up: Your physician recommends that you schedule a follow-up appointment in: 4-[facility] MON[facility]   Any Other Special Instructions Will Be Listed Below (If Applicable). IF YOUR CHEST PAIN GETS WORSE, INCREASES IN FREQUENCY, LASTS LONGER, OR HAVE AT REST CALL THE OFFICE   YOU HAVE BEEN CLEARED FOR UPCOMING SURGERY   If you need a refill on your cardiac medications before your next appointment, please call your pharmacy.     Ramond DialSigned, Angela Vazguez, [place]  11/10/2017 2:56 PM    [facility] [address], [place], [place]  [address] Phone: [phone]; Fax: [phone]

## 2018-07-06 ENCOUNTER — Other Ambulatory Visit: Payer: Self-pay | Admitting: Cardiovascular Disease

## 2018-07-06 MED ORDER — METOPROLOL TARTRATE 25 MG PO TABS: ORAL_TABLET | ORAL | 1 refills | Status: DC

## 2018-07-06 MED ORDER — METOPROLOL TARTRATE 25 MG PO TABS: ORAL_TABLET | ORAL | 6 refills | Status: DC

## 2018-07-06 NOTE — Telephone Encounter (Signed)
Metoprolol refill sent to pharmacy

## 2018-07-06 NOTE — Telephone Encounter (Signed)
 *  STAT* If patient is at the pharmacy, call can be transferred to refill team.   1. Which medications need to be refilled? (please list name of each medication and dose if known) metoprolol tartrate (LOPRESSOR) 25 MG tablet losartan (COZAAR) 100 MG tablet  2. Which pharmacy/location (including street and city if local pharmacy) is medication to be sent to? [facility]  3. Do they need a 30 day or 90 day supply? 90  Patient is out of metoprolol tartrate

## 2018-09-22 ENCOUNTER — Encounter: Payer: Self-pay | Admitting: Cardiovascular Disease

## 2018-09-22 ENCOUNTER — Ambulatory Visit (INDEPENDENT_AMBULATORY_CARE_PROVIDER_SITE_OTHER): Payer: Medicare Other | Admitting: Cardiovascular Disease

## 2018-09-22 VITALS — BP 130/82 | HR 62 | Ht 62.0 in

## 2018-09-22 DIAGNOSIS — N959 Unspecified menopausal and perimenopausal disorder: Secondary | ICD-10-CM | POA: Insufficient documentation

## 2018-09-22 DIAGNOSIS — E559 Vitamin D deficiency, unspecified: Secondary | ICD-10-CM | POA: Insufficient documentation

## 2018-09-22 DIAGNOSIS — R400 Somnolence: Secondary | ICD-10-CM

## 2018-09-22 DIAGNOSIS — R0681 Apnea, not elsewhere classified: Secondary | ICD-10-CM

## 2018-09-22 DIAGNOSIS — R002 Palpitations: Secondary | ICD-10-CM

## 2018-09-22 DIAGNOSIS — G43909 Migraine, unspecified, not intractable, without status migrainosus: Secondary | ICD-10-CM

## 2018-09-22 DIAGNOSIS — R1310 Dysphagia, unspecified: Secondary | ICD-10-CM | POA: Insufficient documentation

## 2018-09-22 DIAGNOSIS — K219 Gastro-esophageal reflux disease without esophagitis: Secondary | ICD-10-CM | POA: Insufficient documentation

## 2018-09-22 DIAGNOSIS — E669 Obesity, unspecified: Secondary | ICD-10-CM | POA: Insufficient documentation

## 2018-09-22 DIAGNOSIS — R1011 Right upper quadrant pain: Secondary | ICD-10-CM | POA: Insufficient documentation

## 2018-09-22 DIAGNOSIS — M179 Osteoarthritis of knee, unspecified: Secondary | ICD-10-CM | POA: Insufficient documentation

## 2018-09-22 DIAGNOSIS — I2510 Atherosclerotic heart disease of native coronary artery without angina pectoris: Secondary | ICD-10-CM | POA: Diagnosis not present

## 2018-09-22 DIAGNOSIS — K589 Irritable bowel syndrome without diarrhea: Secondary | ICD-10-CM

## 2018-09-22 DIAGNOSIS — E782 Mixed hyperlipidemia: Secondary | ICD-10-CM

## 2018-09-22 DIAGNOSIS — M1710 Unilateral primary osteoarthritis, unspecified knee: Secondary | ICD-10-CM | POA: Insufficient documentation

## 2018-09-22 DIAGNOSIS — M549 Dorsalgia, unspecified: Secondary | ICD-10-CM

## 2018-09-22 DIAGNOSIS — I10 Essential (primary) hypertension: Secondary | ICD-10-CM

## 2018-09-22 DIAGNOSIS — F329 Major depressive disorder, single episode, unspecified: Secondary | ICD-10-CM | POA: Insufficient documentation

## 2018-09-22 DIAGNOSIS — R079 Chest pain, unspecified: Secondary | ICD-10-CM

## 2018-09-22 DIAGNOSIS — I208 Other forms of angina pectoris: Secondary | ICD-10-CM

## 2018-09-22 DIAGNOSIS — M2550 Pain in unspecified joint: Secondary | ICD-10-CM | POA: Insufficient documentation

## 2018-09-22 DIAGNOSIS — M5431 Sciatica, right side: Secondary | ICD-10-CM

## 2018-09-22 DIAGNOSIS — Z79899 Other long term (current) drug therapy: Secondary | ICD-10-CM | POA: Insufficient documentation

## 2018-09-22 HISTORY — DX: Sciatica, right side: M54.31

## 2018-09-22 HISTORY — DX: Irritable bowel syndrome, unspecified: K58.9

## 2018-09-22 HISTORY — DX: Osteoarthritis of knee, unspecified: M17.9

## 2018-09-22 HISTORY — DX: Gastro-esophageal reflux disease without esophagitis: K21.9

## 2018-09-22 HISTORY — DX: Dysphagia, unspecified: R13.10

## 2018-09-22 HISTORY — DX: Migraine, unspecified, not intractable, without status migrainosus: G43.909

## 2018-09-22 HISTORY — DX: Dorsalgia, unspecified: M54.9

## 2018-09-22 NOTE — Progress Notes (Signed)
Cardiology Office Note   Date:  09/22/2018   ID:  Lindsay [name], DOB April 17, 1956, MRN [id number]  PCP:  Patient, No Pcp Per  Cardiologist:   Chilton Si, MD   No chief complaint on file.    History of Present Illness: Lindsay [name] is a 63 y.o. female with hypertension, CAD s/p MI in 2010, and paroxysmal atrial fibrillation who presents for follow up.  [name] was evaluated in clinic on 03/24/15  with recurrent chest pain.  She was started on metoprolol and referred for cardiac catheterization.  She underwent cardiac catheterization on 02/2015 that revealed a 50% RCA lesion but otherwise minimal CAD.  She has been feeing well and denies any recent exertional chest pain.  Occasionally she has "twinges" of chest pain that are not like when she had an MI. She stopped taking Imdur due to headaches.    Since her last appointment [name] saw [place], [place], on 10/2017 for clearance prior to dental extraction.  At that time she reported intermittent chest pain that was unchanged from prior.  Since then she has noted some changes.  For the last two years after her father moved out she slept most of the sday.  She slept up to 16 hours daily.  She decided get a job and is now working at [employer].  She gets lots of physical activity but struggles with orthopedic pain.  She also has swelling in her left leg by the end of the day.  She does wear compression stockings.  Her swelling is unilateral and she did denies orthopnea or PND.  She does think that she has sleep apnea.  She finds her self waking up not breathing in the middle the night.  She also is not well rested during the day.  She also struggles with constant postnasal drip.  Since she started work 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  It was worse when she first started and somewhat better now, though it still continues to occur.  She tries to walk for exercise and has no chest pain or shortness of breath with walking.  She  notes occasional palpitations that last for a second or 2.  They usually happen when she is lying in bed at night.  There is no associated shortness of breath, lightheadedness, or dizziness.  She also reports tingling in her left leg that occurs at rest.  She has no claudication with walking.  She does sometimes have severe cramping in the back of her legs and notes that her hair is thinning. Had tightness in chest pushing carts.  Better now   Past Medical History:  Diagnosis Date  . Anginal pain (HCC)   . Atrial fibrillation (HCC)   . Bipolar disorder (HCC)   . Coronary artery disease involving native coronary artery with angina pectoris (HCC) 03/24/2015  . Dysrhythmia   . GERD (gastroesophageal reflux disease)   . History of appendectomy 1963  . Hypertension   . Myocardial infarction (HCC)    per pt due to Vit D deficiency  . Vitamin D deficiency     Past Surgical History:  Procedure Laterality Date  . APPENDECTOMY    . CARDIAC CATHETERIZATION N/A 03/25/2015   Procedure: Left Heart Cath and Coronary Angiography;  Surgeon: Marykay Lex, MD;  Location: [facility] INVASIVE CV LAB;  Service: Cardiovascular;  Laterality: N/A;  . DILATATION & CURETTAGE/HYSTEROSCOPY WITH MYOSURE N/A 05/19/2016   Procedure: DILATATION & CURETTAGE/HYSTEROSCOPY  WITH MYOSURE;  Surgeon: Geryl Rankins, MD;  Location: [facility] ORS;  Service: Gynecology;  Laterality: N/A;  Possible Myosure for polyps.  [name] EYE SURGERY    . MOUTH SURGERY    . URETHRAL DILATION       Current Outpatient Medications  Medication Sig Dispense Refill  . aspirin EC 81 MG tablet Take 81 mg by mouth daily.    . calcium carbonate (TUMS - DOSED IN MG ELEMENTAL CALCIUM) 500 MG chewable tablet Chew 1 tablet by mouth 3 (three) times daily as needed for indigestion or heartburn.    . Cholecalciferol (VITAMIN D) 2000 units tablet Take 2,000 Units by mouth daily.    . DULoxetine (CYMBALTA) 30 MG capsule Take 30 mg by mouth daily.    [name] esomeprazole (NEXIUM)  20 MG capsule Take 20 mg by mouth daily at 12 noon. 20 mg to 40 mg daily as needed    . hydrOXYzine (VISTARIL) 50 MG capsule Take 1 capsule by mouth at bedtime as needed (sleep).   0  . ibuprofen (ADVIL,MOTRIN) 600 MG tablet Take 1 tablet (600 mg total) by mouth every 6 (six) hours as needed. 30 tablet 0  . losartan (COZAAR) 100 MG tablet Take 100 mg by mouth daily.  3  . metoprolol tartrate (LOPRESSOR) 25 MG tablet TAKE 1/2 TAB (12.5 MG TOTAL) BY MOUTH 2 (TWO) TIMES DAILY. 90 tablet 1  . Multiple Vitamin (MULTIVITAMIN) capsule Take 1 capsule by mouth daily. Takes either prenatal or multivitamin most days    . nitroGLYCERIN (NITROSTAT) 0.4 MG SL tablet Place 1 tablet (0.4 mg total) under the tongue as needed for chest pain. 25 tablet PRN  . Omega-3 Fatty Acids (OMEGA-3 EPA FISH OIL PO) Take by mouth daily.    . Prenatal Vit-Fe Fumarate-FA (PRENATAL MULTIVITAMIN) TABS tablet Take 1 tablet by mouth daily at 12 noon. Or multivitamin most days     No current facility-administered medications for this visit.     Allergies:   Bee venom; Sulfa antibiotics; Cephalexin; Ciprofloxacin; Depakote [divalproex sodium]; Risperidone; Seroquel [quetiapine fumarate]; Statins; Topiramate; Tramadol; Citalopram; Lamotrigine; and Zetia [ezetimibe]    Social History:  The patient  reports that she has never smoked. She has never used smokeless tobacco. She reports current alcohol use of about 2.0 standard drinks of alcohol per week. She reports that she does not use drugs.   Family History:  The patient's family history includes Dementia in her mother; Heart attack in her maternal grandfather, maternal grandmother, and paternal grandfather; Kidney failure in her paternal grandmother; Vascular Disease in her mother.    ROS:  Please see the history of present illness.   Otherwise, review of systems are positive for none.   All other systems are reviewed and negative.    PHYSICAL EXAM: VS:  BP 130/82   Pulse 62    Ht  (1.575 m)   SpO2 98%   BMI 35.81 kg/m  , BMI Body mass index is 35.81 kg/m. GENERAL:  Well appearing HEENT: Pupils equal round and reactive, fundi not visualized, oral mucosa unremarkable NECK:  No jugular venous distention, waveform within normal limits, carotid upstroke brisk and symmetric, no bruits, no thyromegaly LYMPHATICS:  No cervical adenopathy LUNGS:  Clear to auscultation bilaterally HEART:  RRR.  PMI not displaced or sustained,S1 and S2 within normal limits, no S3, no S4, no clicks, no rubs, no murmurs ABD:  Flat, positive bowel sounds normal in frequency in pitch, no bruits, no rebound, no guarding, no midline  pulsatile mass, no hepatomegaly, no splenomegaly EXT:  2 plus pulses throughout, no edema, no cyanosis no clubbing SKIN:  No rashes no nodules NEURO:  Cranial nerves II through XII grossly intact, motor grossly intact throughout PSYCH:  Cognitively intact, oriented to person place and time   EKG:  EKG is ordered today. The ekg ordered 03/24/15 demonstrates sinus rhythm at 87 bpm.  Anterolateral TWI concerning for ischemia.   09/22/18: Sinus rhythm.  Rate 62 bpm.  Diffuse TWI.   Recent Labs: No results found for requested labs within last 8760 hours.    Lipid Panel    Component Value Date/Time   CHOL 320 (H) 04/02/2016 1554   TRIG 222 (H) 04/02/2016 1554   HDL 75 04/02/2016 1554   CHOLHDL 4.3 04/02/2016 1554   VLDL 44 (H) 04/02/2016 1554   LDLCALC 201 (H) 04/02/2016 1554      Wt Readings from Last 3 Encounters:  11/08/17 195 lb 12.8 oz (88.8 kg)  05/11/16 180 lb (81.6 kg)  04/02/16 180 lb 9.6 oz (81.9 kg)    TTE 12/16/08: LVEF 55-60%.  Mild LVH.  RV and RA  mildly dilated.  Cardiac catheterization 03/25/15: 1. Prox RCA lesion, 20% stenosed. 2. Dist RCA lesion, 50% stenosed. 3. Ost 2nd Diag to 2nd Diag lesion, 30% stenosed. 4. The left ventricular systolic function is normal.  Other studies Reviewed: Additional studies/ records that were  reviewed today include: n/a. Review of the above records demonstrates:  Please see elsewhere in the note.     ASSESSMENT AND PLAN:  # Non-obstructive CAD: # Hyperlipidemia:  Ms. Rumberger had non-obstructive disease on cardiac cath 02/2015.  However she now as exertional chest pain and EKG changes concerning for ischemia.  We will plan to repeat her cath.  She hasn't tolerated statins in the past and was supposed to start Repatha but it was not affordable before.  Now that she has insurance we will revisit this.  Check fasting lipids/CMP.  Continue Zetia for now.  # Hypertension: BP slightly above goal.  Continue losartan and metoprolol  # Atrial fibrillation: Currently in sinus rhythm.  She is not on anticoagulation 2/2 history of GI bleeding,  Continue aspirin and metoprolol.  This patients CHA2DS2-VASc Score and unadjusted Ischemic Stroke Rate (% per year) is equal to 0.6 % stroke rate/year from a score of 1  Above score calculated as 1 point each if present [CHF, HTN, DM, Vascular=MI/PAD/Aortic Plaque, Age if 65-74, or Female] Above score calculated as 2 points each if present [Age > 75, or Stroke/TIA/TE]  # Obesity: Ms. Goyal was encouraged to increase her physical activity to at least 30 minutes most days of the week.    # Fatigue: # Daytime somnolence: # Apnea: Check sleep study.  Check TSH, CMP, CBC, magnesium.   # LE Edema:  Unilateral and worse and the end of the day.  Not present on exam today.  Recommend compression stocking.    Current medicines are reviewed at length with the patient today.  The patient does not have concerns regarding medicines.  The following changes have been made:  None  Labs/ tests ordered today include:  Orders Placed This Encounter  Procedures  . CBC with Differential/Platelet  . T4, free  . TSH  . Lipid panel  . Comprehensive metabolic panel  . Magnesium  . EKG 12-Lead  . Split night study     Disposition:   FU with Azalee Course, PA in 2  months.  Signed, Chilton Si, MD  09/22/2018 9:09 AM    [facility]

## 2018-09-22 NOTE — Patient Instructions (Addendum)
Medication Instructions:  Your physician recommends that you continue on your current medications as directed. Please refer to the Current Medication list given to you today.  If you need a refill on your cardiac medications before your next appointment, please call your pharmacy.   Lab work: FASTING LP/CMET/CBC/MAGNESIUM/TSH/FT4 WEEK BEFORE CATH  If you have labs (blood work) drawn today and your tests are completely normal, you will receive your results only by: [name] MyChart Message (if you have MyChart) OR . A paper copy in the mail If you have any lab test that is abnormal or we need to change your treatment, we will call you to review the results.  Testing/Procedures: Your physician has requested that you have a cardiac catheterization. Cardiac catheterization is used to diagnose and/or treat various heart conditions. Doctors may recommend this procedure for a number of different reasons. The most common reason is to evaluate chest pain. Chest pain can be a symptom of coronary artery disease (CAD), and cardiac catheterization can show whether plaque is narrowing or blocking your heart's arteries. This procedure is also used to evaluate the valves, as well as measure the blood flow and oxygen levels in different parts of your heart. For further information please visit [email or web address] Please follow instruction sheet, as given.  Your physician has recommended that you have a sleep study. This test records several body functions during sleep, including: brain activity, eye movement, oxygen and carbon dioxide blood levels, heart rate and rhythm, breathing rate and rhythm, the flow of air through your mouth and nose, snoring, body muscle movements, and chest and belly movement. THE OFFICE WILL CALL YOU TO SCHEDULE ONCE YOUR INSURANCE HAS APPROVED   Follow-Up: Your physician recommends that you schedule a follow-up appointment in: H MENG PA 2 MONTHS  Any Other Special Instructions Will Be  Listed Below (If Applicable).  TRY WEARING COMPRESSION STOCKINGS 20-30 mmHg     [facility] CARDIOVASCULAR DIVISION [facility] [address] [place] [address] [place] [address] Dept: [phone] Loc: [phone]  Lindsay [name]  09/22/2018  You are scheduled for a Cardiac Catheterization on Monday, March 16 with Dr. Lance Muss.  1. Please arrive at the [facility] (Main Entrance A) at [facility]: [address] [place], [place] [address] at 7:00 AM (This time is two hours before your procedure to ensure your preparation). Free valet parking service is available.   Special note: Every effort is made to have your procedure done on time. Please understand that emergencies sometimes delay scheduled procedures.  2. Diet: Do not eat solid foods after midnight.  The patient may have clear liquids until 5am upon the day of the procedure.  3. Labs: You will need to have blood drawn WEEK BEFORE CATH 4. Medication instructions in preparation for your procedure:   Contrast Allergy: No  On the morning of your procedure, take your Aspirin and any morning medicines NOT listed above.  You may use sips of water.  5. Plan for one night stay--bring personal belongings. 6. Bring a current list of your medications and current insurance cards. 7. You MUST have a responsible person to drive you home. 8. Someone MUST be with you the first 24 hours after you arrive home or your discharge will be delayed. 9. Please wear clothes that are easy to get on and off and wear slip-on shoes.  Thank you for allowing [place] to care for you!   -- [facility]  Sleep Studies A sleep study (polysomnogram) is a series of tests done while you are sleeping. A sleep study records your brain waves, heart rate, breathing rate, oxygen level, and eye and leg movements. A sleep study helps your health care provider:  See how well you  sleep.  Diagnose a sleep disorder.  Determine how severe your sleep disorder is.  Create a plan to treat your sleep disorder. Your health care provider may recommend a sleep study if you:  Feel sleepy on most days.  Snore loudly while sleeping.  Have unusual behaviors while you sleep, such as walking.  Have brief periods in which you stop breathing during sleep (sleepapnea).  Fall asleep suddenly during the day (narcolepsy).  Have trouble falling asleep or staying asleep (insomnia).  Feel like you need to move your legs when trying to fall asleep (restless legs syndrome).  Move your legs by flexing and extending them regularly while asleep (periodic limb movement disorder).  Act out your dreams while you sleep (sleep behavior disorder).  Feel like you cannot move when you first wake up (sleep paralysis). What tests are part of a sleep study? Most sleep studies record the following during sleep:  Brain activity.  Eye movements.  Heart rate and rhythm.  Breathing rate and rhythm.  Blood-oxygen level.  Blood pressure.  Chest and belly movement as you breathe.  Arm and leg movements.  Snoring or other noises.  Body position. Where are sleep studies done? Sleep studies are done at sleep centers. A sleep center may be inside a hospital, office, or clinic. The room where you have the study may look like a hospital room or a hotel room. The health care providers doing the study may come in and out of the room during the study. Most of the time, they will be in another room monitoring your test as you sleep. How are sleep studies done? Most sleep studies are done during a normal period of time for a full night of sleep. You will arrive at the study center in the evening and go home in the morning. Before the test  Bring your pajamas and toothbrush with you to the sleep study.  Do not have caffeine on the day of your sleep study.  Do not drink alcohol on the day of  your sleep study.  Your health care provider will let you know if you should stop taking any of your regular medicines before the test. During the test      Round, sticky patches with sensors attached to recording wires (electrodes) are placed on your scalp, face, chest, and limbs.  Wires from all the electrodes and sensors run from your bed to a computer. The wires can be taken off and put back on if you need to get out of bed to go to the bathroom.  A sensor is placed over your nose to measure airflow.  A finger clip is put on your finger or ear to measure your blood oxygen level (pulse oximetry).  A belt is placed around your belly and a belt is placed around your chest to measure breathing movements.  If you have signs of the sleep disorder called sleep apnea during your test, you may get a treatment mask to wear for the second half of the night. ? The mask provides positive airway pressure (PAP) to help you breathe better during sleep. This may greatly improve your sleep apnea. ? You will then have all tests done again with the  mask in place to see if your measurements and recordings change. After the test  A medical doctor who specializes in sleep will evaluate the results of your sleep study and share them with you and your primary health care provider.  Based on your results, your medical history, and a physical exam, you may be diagnosed with a sleep disorder, such as: ? Sleep apnea. ? Restless legs syndrome. ? Sleep-related behavior disorder. ? Sleep-related movement disorders. ? Sleep-related seizure disorders.  Your health care team will help determine your treatment options based on your diagnosis. This may include: ? Improving your sleep habits (sleep hygiene). ? Wearing a continuous positive airway pressure (CPAP) or bi-level positive airway pressure (BPAP) mask. ? Wearing an oral device at night to improve breathing and reduce snoring. ? Taking medicines. Follow  these instructions at home:  Take over-the-counter and prescription medicines only as told by your health care provider.  If you are instructed to use a CPAP or BPAP mask, make sure you use it nightly as directed.  Make any lifestyle changes that your health care provider recommends.  If you were given a device to open your airway while you sleep, use it only as told by your health care provider.  Do not use any tobacco products, such as cigarettes, chewing tobacco, and e-cigarettes. If you need help quitting, ask your health care provider.  Keep all follow-up visits as told by your health care provider. This is important. Summary  A sleep study (polysomnogram) is a series of tests done while you are sleeping. It shows how well you sleep.  Most sleep studies are done over one full night of sleep. You will arrive at the study center in the evening and go home in the morning.  If you have signs of the sleep disorder called sleep apnea during your test, you may get a treatment mask to wear for the second half of the night.  A medical doctor who specializes in sleep will evaluate the results of your sleep study and share them with your primary health care provider. This information is not intended to replace advice given to you by your health care provider. Make sure you discuss any questions you have with your health care provider. Document Released: 01/16/2003 Document Revised: 08/09/2017 Document Reviewed: 08/09/2017 Elsevier Interactive Patient Education  [employer].

## 2018-09-22 NOTE — H&P (View-Only) (Signed)
  Cardiology Office Note   Date:  09/22/2018   ID:  Lindsay [name], DOB 10/23/1955, MRN [id number]  PCP:  Patient, No Pcp Per  Cardiologist:   Australia Droll [place], MD   No chief complaint on file.    History of Present Illness: Lindsay [name] is a 63 y.o. female with hypertension, CAD s/p MI in 2010, and paroxysmal atrial fibrillation who presents for follow up.  Lindsay [name] was evaluated in clinic on 03/24/15  with recurrent chest pain.  She was started on metoprolol and referred for cardiac catheterization.  She underwent cardiac catheterization on 02/2015 that revealed a 50% RCA lesion but otherwise minimal CAD.  She has been feeing well and denies any recent exertional chest pain.  Occasionally she has "twinges" of chest pain that are not like when she had an MI. She stopped taking Imdur due to headaches.    Since her last appointment Lindsay [name] saw Hao Meng, PA, on 10/2017 for clearance prior to dental extraction.  At that time she reported intermittent chest pain that was unchanged from prior.  Since then she has noted some changes.  For the last two years after her father moved out she slept most of the sday.  She slept up to 16 hours daily.  She decided get a job and is now working at [employer].  She gets lots of physical activity but struggles with orthopedic pain.  She also has swelling in her left leg by the end of the day.  She does wear compression stockings.  Her swelling is unilateral and she did denies orthopnea or PND.  She does think that she has sleep apnea.  She finds her self waking up not breathing in the middle the night.  She also is not well rested during the day.  She also struggles with constant postnasal drip.  Since she started work Lindsay [name] notes tightness in her chest when she is pushing the carts.  It was worse when she first started and somewhat better now, though it still continues to occur.  She tries to walk for exercise and has no chest pain or shortness of breath with walking.  She  notes occasional palpitations that last for a second or 2.  They usually happen when she is lying in bed at night.  There is no associated shortness of breath, lightheadedness, or dizziness.  She also reports tingling in her left leg that occurs at rest.  She has no claudication with walking.  She does sometimes have severe cramping in the back of her legs and notes that her hair is thinning. Had tightness in chest pushing carts.  Better now   Past Medical History:  Diagnosis Date  . Anginal pain (HCC)   . Atrial fibrillation (HCC)   . Bipolar disorder (HCC)   . Coronary artery disease involving native coronary artery with angina pectoris (HCC) 03/24/2015  . Dysrhythmia   . GERD (gastroesophageal reflux disease)   . History of appendectomy 1963  . Hypertension   . Myocardial infarction (HCC)    per pt due to Vit D deficiency  . Vitamin D deficiency     Past Surgical History:  Procedure Laterality Date  . APPENDECTOMY    . CARDIAC CATHETERIZATION N/A 03/25/2015   Procedure: Left Heart Cath and Coronary Angiography;  Surgeon: David W Harding, MD;  Location: [facility];  Service: Cardiovascular;  Laterality: N/A;  . DILATATION & CURETTAGE/HYSTEROSCOPY WITH MYOSURE N/A 05/19/2016   Procedure: DILATATION & CURETTAGE/HYSTEROSCOPY   WITH MYOSURE;  Surgeon: Evelyn Varnado, MD;  Location: [facility] ORS;  Service: Gynecology;  Laterality: N/A;  Possible Myosure for polyps.  . EYE SURGERY    . MOUTH SURGERY    . URETHRAL DILATION       Current Outpatient Medications  Medication Sig Dispense Refill  . aspirin EC 81 MG tablet Take 81 mg by mouth daily.    . calcium carbonate (TUMS - DOSED IN MG ELEMENTAL CALCIUM) 500 MG chewable tablet Chew 1 tablet by mouth 3 (three) times daily as needed for indigestion or heartburn.    . Cholecalciferol (VITAMIN D) 2000 units tablet Take 2,000 Units by mouth daily.    . DULoxetine (CYMBALTA) 30 MG capsule Take 30 mg by mouth daily.    . esomeprazole (NEXIUM)  20 MG capsule Take 20 mg by mouth daily at 12 noon. 20 mg to 40 mg daily as needed    . hydrOXYzine (VISTARIL) 50 MG capsule Take 1 capsule by mouth at bedtime as needed (sleep).   0  . ibuprofen (ADVIL,MOTRIN) 600 MG tablet Take 1 tablet (600 mg total) by mouth every 6 (six) hours as needed. 30 tablet 0  . losartan (COZAAR) 100 MG tablet Take 100 mg by mouth daily.  3  . metoprolol tartrate (LOPRESSOR) 25 MG tablet TAKE 1/2 TAB (12.5 MG TOTAL) BY MOUTH 2 (TWO) TIMES DAILY. 90 tablet 1  . Multiple Vitamin (MULTIVITAMIN) capsule Take 1 capsule by mouth daily. Takes either prenatal or multivitamin most days    . nitroGLYCERIN (NITROSTAT) 0.4 MG SL tablet Place 1 tablet (0.4 mg total) under the tongue as needed for chest pain. 25 tablet PRN  . Omega-3 Fatty Acids (OMEGA-3 EPA FISH OIL PO) Take by mouth daily.    . Prenatal Vit-Fe Fumarate-FA (PRENATAL MULTIVITAMIN) TABS tablet Take 1 tablet by mouth daily at 12 noon. Or multivitamin most days     No current facility-administered medications for this visit.     Allergies:   Bee venom; Sulfa antibiotics; Cephalexin; Ciprofloxacin; Depakote [divalproex sodium]; Risperidone; Seroquel [quetiapine fumarate]; Statins; Topiramate; Tramadol; Citalopram; Lamotrigine; and Zetia [ezetimibe]    Social History:  The patient  reports that she has never smoked. She has never used smokeless tobacco. She reports current alcohol use of about 2.0 standard drinks of alcohol per week. She reports that she does not use drugs.   Family History:  The patient's family history includes Dementia in her mother; Heart attack in her maternal grandfather, maternal grandmother, and paternal grandfather; Kidney failure in her paternal grandmother; Vascular Disease in her mother.    ROS:  Please see the history of present illness.   Otherwise, review of systems are positive for none.   All other systems are reviewed and negative.    PHYSICAL EXAM: VS:  BP 130/82   Pulse 62    Ht 5' 2" (1.575 m)   SpO2 98%   BMI 35.81 kg/m  , BMI Body mass index is 35.81 kg/m. GENERAL:  Well appearing HEENT: Pupils equal round and reactive, fundi not visualized, oral mucosa unremarkable NECK:  No jugular venous distention, waveform within normal limits, carotid upstroke brisk and symmetric, no bruits, no thyromegaly LYMPHATICS:  No cervical adenopathy LUNGS:  Clear to auscultation bilaterally HEART:  RRR.  PMI not displaced or sustained,S1 and S2 within normal limits, no S3, no S4, no clicks, no rubs, no murmurs ABD:  Flat, positive bowel sounds normal in frequency in pitch, no bruits, no rebound, no guarding, no midline   pulsatile mass, no hepatomegaly, no splenomegaly EXT:  2 plus pulses throughout, no edema, no cyanosis no clubbing SKIN:  No rashes no nodules NEURO:  Cranial nerves II through XII grossly intact, motor grossly intact throughout PSYCH:  Cognitively intact, oriented to person place and time   EKG:  EKG is ordered today. The ekg ordered 03/24/15 demonstrates sinus rhythm at 87 bpm.  Anterolateral TWI concerning for ischemia.   09/22/18: Sinus rhythm.  Rate 62 bpm.  Diffuse TWI.   Recent Labs: No results found for requested labs within last 8760 hours.    Lipid Panel    Component Value Date/Time   CHOL 320 (H) 04/02/2016 1554   TRIG 222 (H) 04/02/2016 1554   HDL 75 04/02/2016 1554   CHOLHDL 4.3 04/02/2016 1554   VLDL 44 (H) 04/02/2016 1554   LDLCALC 201 (H) 04/02/2016 1554      Wt Readings from Last 3 Encounters:  11/08/17 195 lb 12.8 oz (88.8 kg)  05/11/16 180 lb (81.6 kg)  04/02/16 180 lb 9.6 oz (81.9 kg)    TTE 12/16/08: LVEF 55-60%.  Mild LVH.  RV and RA  mildly dilated.  Cardiac catheterization 03/25/15: 1. Prox RCA lesion, 20% stenosed. 2. Dist RCA lesion, 50% stenosed. 3. Ost 2nd Diag to 2nd Diag lesion, 30% stenosed. 4. The left ventricular systolic function is normal.  Other studies Reviewed: Additional studies/ records that were  reviewed today include: n/a. Review of the above records demonstrates:  Please see elsewhere in the note.     ASSESSMENT AND PLAN:  # Non-obstructive CAD: # Hyperlipidemia:  Ms. Branson had non-obstructive disease on cardiac cath 02/2015.  However she now as exertional chest pain and EKG changes concerning for ischemia.  We will plan to repeat her cath.  She hasn't tolerated statins in the past and was supposed to start Repatha but it was not affordable before.  Now that she has insurance we will revisit this.  Check fasting lipids/CMP.  Continue Zetia for now.  # Hypertension: BP slightly above goal.  Continue losartan and metoprolol  # Atrial fibrillation: Currently in sinus rhythm.  She is not on anticoagulation 2/2 history of GI bleeding,  Continue aspirin and metoprolol.  This patients CHA2DS2-VASc Score and unadjusted Ischemic Stroke Rate (% per year) is equal to 0.6 % stroke rate/year from a score of 1  Above score calculated as 1 point each if present [CHF, HTN, DM, Vascular=MI/PAD/Aortic Plaque, Age if 65-74, or Female] Above score calculated as 2 points each if present [Age > 75, or Stroke/TIA/TE]  # Obesity: Ms. Treloar was encouraged to increase her physical activity to at least 30 minutes most days of the week.    # Fatigue: # Daytime somnolence: # Apnea: Check sleep study.  Check TSH, CMP, CBC, magnesium.   # LE Edema:  Unilateral and worse and the end of the day.  Not present on exam today.  Recommend compression stocking.    Current medicines are reviewed at length with the patient today.  The patient does not have concerns regarding medicines.  The following changes have been made:  None  Labs/ tests ordered today include:  Orders Placed This Encounter  Procedures  . CBC with Differential/Platelet  . T4, free  . TSH  . Lipid panel  . Comprehensive metabolic panel  . Magnesium  . EKG 12-Lead  . Split night study     Disposition:   FU with Hao Meng, PA in 2  months.       Signed, Milbert Bixler [place], MD  09/22/2018 9:09 AM    [facility] 

## 2018-09-25 ENCOUNTER — Telehealth: Payer: Self-pay | Admitting: *Deleted

## 2018-09-25 NOTE — Telephone Encounter (Signed)
Left sleep study appointment details on home answering machine (dpr signed)

## 2018-09-25 NOTE — Telephone Encounter (Signed)
-----   Message from Burnell Blanks, LPN sent at [phone]/96/7893 10:27 AM EST ----- HEY  PATIENT NEEDS SLEEP STUDY THANKS MELINDA

## 2018-10-02 ENCOUNTER — Other Ambulatory Visit: Payer: Self-pay | Admitting: Obstetrics and Gynecology

## 2018-10-02 DIAGNOSIS — R002 Palpitations: Secondary | ICD-10-CM | POA: Diagnosis not present

## 2018-10-02 DIAGNOSIS — Z1231 Encounter for screening mammogram for malignant neoplasm of breast: Secondary | ICD-10-CM

## 2018-10-02 DIAGNOSIS — R079 Chest pain, unspecified: Secondary | ICD-10-CM | POA: Diagnosis not present

## 2018-10-02 DIAGNOSIS — I10 Essential (primary) hypertension: Secondary | ICD-10-CM | POA: Diagnosis not present

## 2018-10-02 DIAGNOSIS — E782 Mixed hyperlipidemia: Secondary | ICD-10-CM | POA: Diagnosis not present

## 2018-10-03 ENCOUNTER — Telehealth: Payer: Self-pay | Admitting: Cardiovascular Disease

## 2018-10-03 LAB — CBC WITH DIFFERENTIAL/PLATELET
Basophils Absolute: 0 10*3/uL (ref 0.0–0.2)
Basos: 1 %
EOS (ABSOLUTE): 0.1 10*3/uL (ref 0.0–0.4)
EOS: 1 %
HEMATOCRIT: 41.7 % (ref 34.0–46.6)
HEMOGLOBIN: 13.9 g/dL (ref 11.1–15.9)
Immature Grans (Abs): 0 10*3/uL (ref 0.0–0.1)
Immature Granulocytes: 0 %
LYMPHS ABS: 1.2 10*3/uL (ref 0.7–3.1)
Lymphs: 28 %
MCH: 31.5 pg (ref 26.6–33.0)
MCHC: 33.3 g/dL (ref 31.5–35.7)
MCV: 95 fL (ref 79–97)
MONOCYTES: 11 %
Monocytes Absolute: 0.5 10*3/uL (ref 0.1–0.9)
NEUTROS ABS: 2.6 10*3/uL (ref 1.4–7.0)
Neutrophils: 59 %
Platelets: 254 10*3/uL (ref 150–450)
RBC: 4.41 x10E6/uL (ref 3.77–5.28)
RDW: 12.2 % (ref 11.7–15.4)
WBC: 4.4 10*3/uL (ref 3.4–10.8)

## 2018-10-03 LAB — COMPREHENSIVE METABOLIC PANEL
ALBUMIN: 4.5 g/dL (ref 3.8–4.8)
ALT: 24 IU/L (ref 0–32)
AST: 31 IU/L (ref 0–40)
Albumin/Globulin Ratio: 1.9 (ref 1.2–2.2)
Alkaline Phosphatase: 88 IU/L (ref 39–117)
BUN / CREAT RATIO: 18 (ref 12–28)
BUN: 13 mg/dL (ref 8–27)
Bilirubin Total: 0.6 mg/dL (ref 0.0–1.2)
CO2: 24 mmol/L (ref 20–29)
CREATININE: 0.71 mg/dL (ref 0.57–1.00)
Calcium: 10.2 mg/dL (ref 8.7–10.3)
Chloride: 99 mmol/L (ref 96–106)
GFR calc Af Amer: 103 mL/min/{1.73_m2} (ref >59)
GFR, EST NON AFRICAN AMERICAN: 90 mL/min/{1.73_m2} (ref >59)
GLOBULIN, TOTAL: 2.4 g/dL (ref 1.5–4.5)
Glucose: 103 mg/dL — ABNORMAL HIGH (ref 65–99)
Potassium: 5 mmol/L (ref 3.5–5.2)
SODIUM: 140 mmol/L (ref 134–144)
TOTAL PROTEIN: 6.9 g/dL (ref 6.0–8.5)

## 2018-10-03 LAB — LIPID PANEL
CHOL/HDL RATIO: 4.9 ratio — AB (ref 0.0–4.4)
Cholesterol, Total: 287 mg/dL — ABNORMAL HIGH (ref 100–199)
HDL: 59 mg/dL (ref >39)
LDL Calculated: 204 mg/dL — ABNORMAL HIGH (ref 0–99)
Triglycerides: 121 mg/dL (ref 0–149)
VLDL CHOLESTEROL CAL: 24 mg/dL (ref 5–40)

## 2018-10-03 LAB — TSH: TSH: 3.19 u[IU]/mL (ref 0.450–4.500)

## 2018-10-03 LAB — MAGNESIUM: Magnesium: 1.7 mg/dL (ref 1.6–2.3)

## 2018-10-03 LAB — T4, FREE: Free T4: 1.08 ng/dL (ref 0.82–1.77)

## 2018-10-03 NOTE — Telephone Encounter (Signed)
Spoke to patient . Question answered- yes would be done that if possible , you would usually stay  Overnight,  Out of work bout 3-4 days , ask for a note to return to work.  Patient voiced understanding.

## 2018-10-03 NOTE — Telephone Encounter (Signed)
Patient is having cardiac cath done on 3/16 is has a few questions:  Wants to know if they have to put in a stent she wants to know if they do that right away or get her permission first.  She has to ask time off from work before hand this is why she is asking.   If she does have a stent how long would she have to stay in the hospital.  What is the recovery time for a stent.  She would like to have answers by the end of today, so she can talk to her place of employment.

## 2018-10-03 NOTE — Telephone Encounter (Signed)
Left message to call back  

## 2018-10-05 ENCOUNTER — Telehealth: Payer: Self-pay | Admitting: *Deleted

## 2018-10-05 NOTE — Telephone Encounter (Signed)
LMTCB to review instructions with patient. 

## 2018-10-05 NOTE — Telephone Encounter (Signed)
I reviewed instructions with patient, she verbalized understanding, thanked me for call.   

## 2018-10-05 NOTE — Telephone Encounter (Signed)
Pt contacted pre-catheterization scheduled at [facility] for: Monday October 09, 2018 9 AM Verified arrival time and place: [facility] Main Entrance A at: 7 AM  No solid food after midnight prior to cath, clear liquids until 5 AM day of procedure. Contrast allergy: no  AM meds can be  taken pre-cath with sip of water including: ASA 81 mg  Confirm patient has responsible person to drive home post procedure and observe 24 hours after arriving home.  LMTCB to review instructions with patient.

## 2018-10-09 ENCOUNTER — Encounter (HOSPITAL_COMMUNITY)
Admission: RE | Disposition: A | Discharge status: Home / Self Care | Payer: Self-pay | Source: Home / Self Care | Attending: Interventional Cardiology

## 2018-10-09 ENCOUNTER — Other Ambulatory Visit: Payer: Self-pay

## 2018-10-09 ENCOUNTER — Ambulatory Visit (HOSPITAL_COMMUNITY)
Admission: RE | Admit: 2018-10-09 | Discharge: 2018-10-09 | Disposition: A | Discharge status: Home / Self Care | Payer: Medicare Other | Attending: Interventional Cardiology | Admitting: Interventional Cardiology

## 2018-10-09 DIAGNOSIS — I252 Old myocardial infarction: Secondary | ICD-10-CM | POA: Diagnosis not present

## 2018-10-09 DIAGNOSIS — Z881 Allergy status to other antibiotic agents status: Secondary | ICD-10-CM | POA: Insufficient documentation

## 2018-10-09 DIAGNOSIS — Z6835 Body mass index (BMI) 35.0-35.9, adult: Secondary | ICD-10-CM | POA: Insufficient documentation

## 2018-10-09 DIAGNOSIS — I25110 Atherosclerotic heart disease of native coronary artery with unstable angina pectoris: Secondary | ICD-10-CM | POA: Diagnosis not present

## 2018-10-09 DIAGNOSIS — E782 Mixed hyperlipidemia: Secondary | ICD-10-CM | POA: Diagnosis present

## 2018-10-09 DIAGNOSIS — I10 Essential (primary) hypertension: Secondary | ICD-10-CM | POA: Diagnosis present

## 2018-10-09 DIAGNOSIS — Z885 Allergy status to narcotic agent status: Secondary | ICD-10-CM | POA: Diagnosis not present

## 2018-10-09 DIAGNOSIS — Z8249 Family history of ischemic heart disease and other diseases of the circulatory system: Secondary | ICD-10-CM | POA: Insufficient documentation

## 2018-10-09 DIAGNOSIS — E559 Vitamin D deficiency, unspecified: Secondary | ICD-10-CM | POA: Insufficient documentation

## 2018-10-09 DIAGNOSIS — I25118 Atherosclerotic heart disease of native coronary artery with other forms of angina pectoris: Secondary | ICD-10-CM

## 2018-10-09 DIAGNOSIS — Z888 Allergy status to other drugs, medicaments and biological substances status: Secondary | ICD-10-CM | POA: Diagnosis not present

## 2018-10-09 DIAGNOSIS — I200 Unstable angina: Secondary | ICD-10-CM | POA: Diagnosis present

## 2018-10-09 DIAGNOSIS — I480 Paroxysmal atrial fibrillation: Secondary | ICD-10-CM

## 2018-10-09 DIAGNOSIS — E119 Type 2 diabetes mellitus without complications: Secondary | ICD-10-CM | POA: Diagnosis not present

## 2018-10-09 DIAGNOSIS — Z7982 Long term (current) use of aspirin: Secondary | ICD-10-CM | POA: Diagnosis not present

## 2018-10-09 DIAGNOSIS — I25119 Atherosclerotic heart disease of native coronary artery with unspecified angina pectoris: Secondary | ICD-10-CM | POA: Diagnosis present

## 2018-10-09 DIAGNOSIS — Z882 Allergy status to sulfonamides status: Secondary | ICD-10-CM | POA: Insufficient documentation

## 2018-10-09 DIAGNOSIS — K219 Gastro-esophageal reflux disease without esophagitis: Secondary | ICD-10-CM | POA: Diagnosis not present

## 2018-10-09 DIAGNOSIS — E669 Obesity, unspecified: Secondary | ICD-10-CM | POA: Diagnosis not present

## 2018-10-09 DIAGNOSIS — Z95820 Peripheral vascular angioplasty status with implants and grafts: Secondary | ICD-10-CM

## 2018-10-09 DIAGNOSIS — R600 Localized edema: Secondary | ICD-10-CM | POA: Insufficient documentation

## 2018-10-09 DIAGNOSIS — Z79899 Other long term (current) drug therapy: Secondary | ICD-10-CM | POA: Diagnosis not present

## 2018-10-09 HISTORY — PX: LEFT HEART CATH AND CORONARY ANGIOGRAPHY: CATH118249

## 2018-10-09 HISTORY — DX: Paroxysmal atrial fibrillation: I48.0

## 2018-10-09 HISTORY — PX: CORONARY STENT INTERVENTION: CATH118234

## 2018-10-09 LAB — POCT ACTIVATED CLOTTING TIME
Activated Clotting Time: 301 seconds
Activated Clotting Time: 307 seconds

## 2018-10-09 SURGERY — LEFT HEART CATH AND CORONARY ANGIOGRAPHY
Anesthesia: LOCAL

## 2018-10-09 MED ORDER — SODIUM CHLORIDE 0.9 % IV SOLN: INTRAVENOUS | Status: AC

## 2018-10-09 MED ORDER — SODIUM CHLORIDE 0.9 % IV SOLN: 250.0000 mL | INTRAVENOUS | Status: DC | PRN

## 2018-10-09 MED ORDER — HEPARIN (PORCINE) IN NACL 1000-0.9 UT/500ML-% IV SOLN
INTRAVENOUS | Status: DC | PRN
  Administered 2018-10-09 (×2): 500 mL

## 2018-10-09 MED ORDER — FENTANYL CITRATE (PF) 100 MCG/2ML IJ SOLN
INTRAMUSCULAR | Status: AC
  Filled 2018-10-09: qty 2

## 2018-10-09 MED ORDER — IOHEXOL 350 MG/ML SOLN
INTRAVENOUS | Status: DC | PRN
  Administered 2018-10-09: 175 mL via INTRA_ARTERIAL

## 2018-10-09 MED ORDER — ASPIRIN 81 MG PO CHEW: 81.0000 mg | CHEWABLE_TABLET | Freq: Every day | ORAL | Status: DC

## 2018-10-09 MED ORDER — MIDAZOLAM HCL 2 MG/2ML IJ SOLN
INTRAMUSCULAR | Status: AC
  Filled 2018-10-09: qty 2

## 2018-10-09 MED ORDER — CLOPIDOGREL BISULFATE 300 MG PO TABS
ORAL_TABLET | ORAL | Status: AC
  Filled 2018-10-09: qty 2

## 2018-10-09 MED ORDER — SODIUM CHLORIDE 0.9 % WEIGHT BASED INFUSION: 1.0000 mL/kg/h | INTRAVENOUS | Status: DC

## 2018-10-09 MED ORDER — HEPARIN (PORCINE) IN NACL 1000-0.9 UT/500ML-% IV SOLN
INTRAVENOUS | Status: AC
  Filled 2018-10-09: qty 1000

## 2018-10-09 MED ORDER — SODIUM CHLORIDE 0.9% FLUSH: 3.0000 mL | Freq: Two times a day (BID) | INTRAVENOUS | Status: DC

## 2018-10-09 MED ORDER — LIDOCAINE HCL (PF) 1 % IJ SOLN
INTRAMUSCULAR | Status: AC
  Filled 2018-10-09: qty 30

## 2018-10-09 MED ORDER — VERAPAMIL HCL 2.5 MG/ML IV SOLN
INTRAVENOUS | Status: AC
  Filled 2018-10-09: qty 2

## 2018-10-09 MED ORDER — SODIUM CHLORIDE 0.9 % WEIGHT BASED INFUSION
3.0000 mL/kg/h | INTRAVENOUS | Status: AC
  Administered 2018-10-09: 3 mL/kg/h via INTRAVENOUS

## 2018-10-09 MED ORDER — VERAPAMIL HCL 2.5 MG/ML IV SOLN
INTRAVENOUS | Status: DC | PRN
  Administered 2018-10-09: 10 mL via INTRA_ARTERIAL

## 2018-10-09 MED ORDER — NITROGLYCERIN 1 MG/10 ML FOR IR/CATH LAB
INTRA_ARTERIAL | Status: AC
  Filled 2018-10-09: qty 10

## 2018-10-09 MED ORDER — LABETALOL HCL 5 MG/ML IV SOLN: 10.0000 mg | INTRAVENOUS | Status: DC | PRN

## 2018-10-09 MED ORDER — FENTANYL CITRATE (PF) 100 MCG/2ML IJ SOLN
INTRAMUSCULAR | Status: DC | PRN
  Administered 2018-10-09 (×3): 25 ug via INTRAVENOUS

## 2018-10-09 MED ORDER — ONDANSETRON HCL 4 MG/2ML IJ SOLN: 4.0000 mg | Freq: Four times a day (QID) | INTRAMUSCULAR | Status: DC | PRN

## 2018-10-09 MED ORDER — SODIUM CHLORIDE 0.9% FLUSH: 3.0000 mL | INTRAVENOUS | Status: DC | PRN

## 2018-10-09 MED ORDER — MIDAZOLAM HCL 2 MG/2ML IJ SOLN
INTRAMUSCULAR | Status: DC | PRN
  Administered 2018-10-09 (×2): 1 mg via INTRAVENOUS
  Administered 2018-10-09: 2 mg via INTRAVENOUS

## 2018-10-09 MED ORDER — HYDRALAZINE HCL 20 MG/ML IJ SOLN: 5.0000 mg | INTRAMUSCULAR | Status: DC | PRN

## 2018-10-09 MED ORDER — HEPARIN SODIUM (PORCINE) 1000 UNIT/ML IJ SOLN
INTRAMUSCULAR | Status: AC
  Filled 2018-10-09: qty 1

## 2018-10-09 MED ORDER — LIDOCAINE HCL (PF) 1 % IJ SOLN
INTRAMUSCULAR | Status: DC | PRN
  Administered 2018-10-09: 3 mL via INTRADERMAL

## 2018-10-09 MED ORDER — CLOPIDOGREL BISULFATE 75 MG PO TABS: 75.0000 mg | ORAL_TABLET | Freq: Every day | ORAL | 11 refills | Status: DC

## 2018-10-09 MED ORDER — HEPARIN SODIUM (PORCINE) 1000 UNIT/ML IJ SOLN
INTRAMUSCULAR | Status: DC | PRN
  Administered 2018-10-09: 7000 [IU] via INTRAVENOUS
  Administered 2018-10-09 (×2): 4000 [IU] via INTRAVENOUS

## 2018-10-09 MED ORDER — NITROGLYCERIN 1 MG/10 ML FOR IR/CATH LAB
INTRA_ARTERIAL | Status: DC | PRN
  Administered 2018-10-09 (×3): 200 ug via INTRACORONARY

## 2018-10-09 MED ORDER — CLOPIDOGREL BISULFATE 75 MG PO TABS: 75.0000 mg | ORAL_TABLET | Freq: Every day | ORAL | Status: DC

## 2018-10-09 MED ORDER — CLOPIDOGREL BISULFATE 300 MG PO TABS
ORAL_TABLET | ORAL | Status: DC | PRN
  Administered 2018-10-09: 600 mg via ORAL

## 2018-10-09 MED ORDER — ASPIRIN 81 MG PO CHEW: 81.0000 mg | CHEWABLE_TABLET | ORAL | Status: DC

## 2018-10-09 MED ORDER — ACETAMINOPHEN 325 MG PO TABS: 650.0000 mg | ORAL_TABLET | ORAL | Status: DC | PRN

## 2018-10-09 MED FILL — CLOPIDOGREL 75 MG TABLET: 75 | 30 days supply | Qty: 30 | Fill #0 | Status: TO

## 2018-10-09 SURGICAL SUPPLY — 20 items
BALLN SAPPHIRE 2.0X12 (BALLOONS) ×2
BALLN SAPPHIRE ~~LOC~~ 2.5X12 (BALLOONS) ×2 IMPLANT
BALLOON SAPPHIRE 2.0X12 (BALLOONS) ×1 IMPLANT
CATH 5FR JL3.5 JR4 ANG PIG MP (CATHETERS) ×2 IMPLANT
CATH LAUNCHER 6FR EBU 3 (CATHETERS) ×2 IMPLANT
CATHETER LAUNCHER 6FR RCB SH (CATHETERS) IMPLANT
DEVICE RAD COMP TR BAND LRG (VASCULAR PRODUCTS) ×2 IMPLANT
GLIDESHEATH SLEND SS 6F .021 (SHEATH) ×2 IMPLANT
GUIDEWIRE INQWIRE 1.5J.035X260 (WIRE) ×1 IMPLANT
INQWIRE 1.5J .035X260CM (WIRE) ×2
KIT ENCORE 26 ADVANTAGE (KITS) ×2 IMPLANT
KIT HEART LEFT (KITS) ×2 IMPLANT
KIT HEMO VALVE WATCHDOG (MISCELLANEOUS) ×2 IMPLANT
PACK CARDIAC CATHETERIZATION (CUSTOM PROCEDURE TRAY) ×2 IMPLANT
STENT SYNERGY DES 2.25X16 (Permanent Stent) ×2 IMPLANT
STENT SYNERGY DES 2.25X8 (Permanent Stent) ×2 IMPLANT
TRANSDUCER W/STOPCOCK (MISCELLANEOUS) ×2 IMPLANT
TUBING CIL FLEX 10 FLL-RA (TUBING) ×2 IMPLANT
WIRE ASAHI PROWATER 180CM (WIRE) ×2 IMPLANT
WIRE HI TORQ BMW 190CM (WIRE) ×2 IMPLANT

## 2018-10-09 NOTE — Discharge Summary (Addendum)
Discharge Summary    Patient ID: Lindsay [name] MRN: [id number]; DOB: 15-Apr-1956  Admit date: 10/09/2018 Discharge date: 10/09/2018  Primary Care Provider: Patient, No Pcp Per  Primary Cardiologist: Chilton Si, MD   Discharge Diagnoses    Principal Problem:   Unstable angina (HCC) Active Problems:   HTN (hypertension)   Coronary artery disease involving native coronary artery with angina pectoris (HCC)   Mixed hyperlipidemia   PAF (paroxysmal atrial fibrillation) (HCC)  Allergies Allergies  Allergen Reactions  . Bee Venom Anaphylaxis  . Sulfa Antibiotics Rash and Swelling  . Ciprofloxacin Nausea And Vomiting  . Depakote [Divalproex Sodium] Other (See Comments)    Unknown  . Risperidone Other (See Comments)    Unknown  . Seroquel [Quetiapine Fumarate] Other (See Comments)    Nightmares  . Statins Other (See Comments)    joint pain, weakness  . Tramadol Other (See Comments)    Serotonin syndrome  . Citalopram Rash    insomnia, agitation  . Lamotrigine Itching and Rash    Other reaction(s): brown urine  . Topiramate Rash    Nightmares, brown urine   . Zetia [Ezetimibe]     Joint pain, weakness     Diagnostic Studies/Procedures    CORONARY STENT INTERVENTION  LEFT HEART CATH AND CORONARY ANGIOGRAPHY  Conclusion     Ost 2nd Diag to 2nd Diag lesion is 30% stenosed.  Prox RCA lesion is 20% stenosed.  Dist RCA lesion is 50% stenosed.  Ost 1st Diag lesion is 90% stenosed.  Prox Cx lesion is 10% stenosed.  1st Diag lesion is 75% stenosed.  A drug-eluting stent was successfully placed using a STENT SYNERGY DES 2.25X16. A drug-eluting stent was successfully placed using a STENT SYNERGY DES 2.25X8 for distal edge dissection.  Post intervention, there is a 0% residual stenosis.  The left ventricular systolic function is normal.  LV end diastolic pressure is normal.  The left ventricular ejection fraction is 55-65% by visual estimate.  There is no aortic  valve stenosis.   DAPT for 6 months along with aggressive secondary prevention.    Plan for same day PCI.        History of Present Illness     Lindsay [name] is a 63 y.o. female with hypertension, HLD, CAD s/p MI in 2010, and paroxysmal atrial fibrillation (not on anticoagulation 2/2 history of GI bleeding) presents for outpatient cath.   Cardiac catheterization on 02/2015 revealed a 50% RCA lesion but otherwise minimal CAD.   She has progressive worsening of exertional chest pain for past few months.  Anterolateral TWI concerning for ischemia. Recommended cath.   Hospital Course     Consultants: None  Cath showed Ost 1st Diag lesion is 90% stenosed and 1st Diag lesion is 75% stenosed. S/p drug-eluting stent was successfully placed using a STENT SYNERGY DES 2.25X16. A drug-eluting stent was successfully placed using a STENT SYNERGY DES 2.25X8 for distal edge dissection. Normal LVEDP and LVEF. Recommended DAPT for 6 months. No radial complications.   The patient been seen by Dr. Eldridge Dace  today and deemed ready for discharge home. All follow-up appointments have been scheduled. Discharge medications are listed below.   Discharge Vitals Blood pressure (!) 107/58, pulse 70, temperature 98 F (36.7 C), temperature source Oral, resp. rate (!) 0, height 5\' 2"  (1.575 m), weight 85.3 kg, SpO2 96 %.  Filed Weights   10/09/18 0728  Weight: 85.3 kg   Physical Exam  Constitutional: She is oriented to person, place, and  time and well-developed, well-nourished, and in no distress.  HENT:  Head: Normocephalic and atraumatic.  Eyes: Pupils are equal, round, and reactive to light. EOM are normal.  Neck: Normal range of motion. Neck supple.  Cardiovascular: Normal rate and regular rhythm.  Radial cath site with TR band  Pulmonary/Chest: Effort normal and breath sounds normal.  Abdominal: Soft. Bowel sounds are normal.  Musculoskeletal: Normal range of motion.  Neurological: She is alert and  oriented to person, place, and time.  Skin: Skin is warm and dry.  Psychiatric: Affect normal.     Labs & Radiologic Studies     Disposition   Pt is being discharged home today in good condition.  Follow-up Plans & Appointments    Follow-up Information    Azalee Course, [place]. Go on 11/22/2018.   Specialties:  Cardiology, Radiology Why:   for hospital follow  up  Contact information: [address] [place] [address] [phone]          Discharge Instructions    Amb Referral to Cardiac Rehabilitation   Complete by:  As directed    Referring to [place] CRP 2   Diagnosis:  Coronary Stents   Diet - low sodium heart healthy   Complete by:  As directed    Discharge instructions   Complete by:  As directed    No driving for 48 hours. No lifting over 5 lbs for 1 week. No sexual activity for 1 week. You may return to work on 10/13/18.  Keep procedure site clean & dry. If you notice increased pain, swelling, bleeding or pus, call/return!  You may shower, but no soaking baths/hot tubs/pools for 1 week.   Increase activity slowly   Complete by:  As directed       Discharge Medications   Allergies as of 10/09/2018      Reactions   Bee Venom Anaphylaxis   Sulfa Antibiotics Rash, Swelling   Ciprofloxacin Nausea And Vomiting   Depakote [divalproex Sodium] Other (See Comments)   Unknown   Risperidone Other (See Comments)   Unknown   Seroquel [quetiapine Fumarate] Other (See Comments)   Nightmares   Statins Other (See Comments)   joint pain, weakness   Tramadol Other (See Comments)   Serotonin syndrome   Citalopram Rash   insomnia, agitation   Lamotrigine Itching, Rash   Other reaction(s): brown urine   Topiramate Rash   Nightmares, brown urine    Zetia [ezetimibe]    Joint pain, weakness       Medication List    TAKE these medications   aspirin EC 81 MG tablet Take 81 mg by mouth at bedtime.   b complex vitamins tablet Take 1 tablet by  mouth daily.   B-12 PO Take 1 tablet by mouth daily.   BIOTIN PO Take 1 tablet by mouth daily.   calcium carbonate 500 MG chewable tablet Commonly known as:  TUMS - dosed in mg elemental calcium Chew 2 tablets by mouth 3 (three) times daily as needed for indigestion or heartburn.   clopidogrel 75 MG tablet Commonly known as:  PLAVIX Take 1 tablet (75 mg total) by mouth daily with breakfast. Start taking on:  October 10, 2018   COQ-10 PO Take 1 tablet by mouth daily.   DULoxetine 30 MG capsule Commonly known as:  CYMBALTA Take 30 mg by mouth daily.   esomeprazole 20 MG capsule Commonly known as:  NEXIUM Take 20 mg by mouth daily.  hydrOXYzine 50 MG capsule Commonly known as:  VISTARIL Take 50 mg by mouth at bedtime as needed for anxiety (sleep).   ibuprofen 200 MG tablet Commonly known as:  ADVIL,MOTRIN Take 200 mg by mouth 2 (two) times daily as needed for moderate pain.   losartan 100 MG tablet Commonly known as:  COZAAR Take 100 mg by mouth daily.   metoprolol tartrate 25 MG tablet Commonly known as:  LOPRESSOR TAKE 1/2 TAB (12.5 MG TOTAL) BY MOUTH 2 (TWO) TIMES DAILY. What changed:    how much to take  how to take this  when to take this  additional instructions   nitroGLYCERIN 0.4 MG SL tablet Commonly known as:  NITROSTAT Place 1 tablet (0.4 mg total) under the tongue as needed for chest pain.   OMEGA 3 PO Take 1 capsule by mouth daily.   oxymetazoline 0.05 % nasal spray Commonly known as:  AFRIN Place 1 spray into both nostrils daily as needed for congestion.   prenatal multivitamin Tabs tablet Take 1 tablet by mouth daily.   VITAMIN D PO Take 2 capsules by mouth daily.        Acute coronary syndrome (MI, NSTEMI, STEMI, etc) this admission?: No.    Outstanding Labs/Studies   N/A  Duration of Discharge Encounter   Greater than 30 minutes including physician time.  SignedSharrell Ku [place], PA 10/09/2018, 3:19 PM   I have  examined the patient and reviewed assessment and plan and discussed with patient.  Agree with above as stated.  Did well with PCI.  Wearing splint to help remind her to keep wrist still.  Continue DAPT along with aggressive secondary prevention.  OK to discharge today.   Lance Muss

## 2018-10-09 NOTE — Progress Notes (Signed)
[id number] Stressed importance of plavix with stents. Reviewed NTG use, heart healthy food choices, ex ed and CRP 2. Referred to [employer] as she works in [employer].  Pt voiced understanding of ed. Luetta Nutting RN BSN 10/09/2018 11:54 AM

## 2018-10-09 NOTE — Discharge Instructions (Signed)
DRINK PLENTY OF FLUIDS FOR THE NEXT 2-3 DAYS TO KEEP HYDRATED.  Radial Site Care  This sheet gives you information about how to care for yourself after your procedure. Your health care provider may also give you more specific instructions. If you have problems or questions, contact your health care provider. What can I expect after the procedure? After the procedure, it is common to have:  Bruising and tenderness at the catheter insertion area. Follow these instructions at home: Medicines  Take over-the-counter and prescription medicines only as told by your health care provider. Insertion site care  Follow instructions from your health care provider about how to take care of your insertion site. Make sure you: ? Wash your hands with soap and water before you change your bandage (dressing). If soap and water are not available, use hand sanitizer. ? Remove dressing in 24 hours.  Check your insertion site every day for signs of infection. Check for: ? Redness, swelling, or pain. ? Fluid or blood. ? Pus or a bad smell. ? Warmth.  Do not take baths, swim, or use a hot tub for 5 days  You may shower 24-48 hours after the procedure, or as directed by your health care provider. ? Remove the dressing and gently wash the site with plain soap and water. ? Pat the area dry with a clean towel. ? Do not rub the site. That could cause bleeding.  Do not apply powder or lotion to the site. Activity   For 24 hours after the procedure, or as directed by your health care provider: ? Do not flex or bend the affected arm. ? Do not push or pull heavy objects with the affected arm. ? Do not drive yourself home from the hospital or clinic. You may drive 24 hours after the procedure unless your health care provider tells you not to. ? Do not operate machinery or power tools.  Do not lift anything that is heavier than 10 lb (4.5 kg) for 5 days.  Ask your health care provider when it is okay  to: ? Return to work or school. ? Resume usual physical activities or sports. ? Resume sexual activity. General instructions  If the catheter site starts to bleed, raise your arm and put firm pressure on the site. If the bleeding does not stop, get help right away. This is a medical emergency.  If you went home on the same day as your procedure, a responsible adult should be with you for the first 24 hours after you arrive home.  Keep all follow-up visits as told by your health care provider. This is important. Contact a health care provider if:  You have a fever.  You have redness, swelling, or yellow drainage around your insertion site. Get help right away if:  You have unusual pain at the radial site.  The catheter insertion area swells very fast.  The insertion area is bleeding, and the bleeding does not stop when you hold steady pressure on the area.  Your arm or hand becomes pale, cool, tingly, or numb. These symptoms may represent a serious problem that is an emergency. Do not wait to see if the symptoms will go away. Get medical help right away. Call your local emergency services (911 in the [place] Do not drive yourself to the hospital. Summary  After the procedure, it is common to have bruising and tenderness at the site.  Follow instructions from your health care provider about how to take care  of your radial site wound. Check the wound every day for signs of infection.  Do not lift anything that is heavier than 10 lb (4.5 kg) for 5 days.  This information is not intended to replace advice given to you by your health care provider. Make sure you discuss any questions you have with your health care provider. Document Released: 08/14/2010 Document Revised: 08/17/2017 Document Reviewed: 08/17/2017 Elsevier Interactive Patient Education  2019 [employer].

## 2018-10-09 NOTE — Interval H&P Note (Signed)
Cath Lab Visit (complete for each Cath Lab visit)  Clinical Evaluation Leading to the Procedure:   ACS: No  Non-ACS:    Anginal Classification: CCS III  Anti-ischemic medical therapy: Minimal Therapy (1 class of medications)  Non-Invasive Test Results: No non-invasive testing performed  Prior CABG: No previous CABG      History and Physical Interval Note:  10/09/2018 9:36 AM  Lindsay [name]  has presented today for surgery, with the diagnosis of Chest pain.  The various methods of treatment have been discussed with the patient and family. After consideration of risks, benefits and other options for treatment, the patient has consented to  Procedure(s): LEFT HEART CATH AND CORONARY ANGIOGRAPHY (N/A) as a surgical intervention.  The patient's history has been reviewed, patient examined, no change in status, stable for surgery.  I have reviewed the patient's chart and labs.  Questions were answered to the patient's satisfaction.     Lance Muss

## 2018-10-10 ENCOUNTER — Encounter (HOSPITAL_COMMUNITY): Payer: Self-pay | Admitting: Interventional Cardiology

## 2018-10-16 ENCOUNTER — Telehealth: Payer: Self-pay | Admitting: Cardiovascular Disease

## 2018-10-16 DIAGNOSIS — F3170 Bipolar disorder, currently in remission, most recent episode unspecified: Secondary | ICD-10-CM | POA: Diagnosis not present

## 2018-10-16 NOTE — Telephone Encounter (Signed)
Returned the pt call. lmtcb. 

## 2018-10-16 NOTE — Telephone Encounter (Signed)
New Message         Patient is calling about the lower stomach pain she is having, she would like a note

## 2018-10-17 NOTE — Telephone Encounter (Signed)
LM TO CALL BACK ./CY 

## 2018-10-18 NOTE — Telephone Encounter (Signed)
LMTCB re: her message and re: her 10/25/18 appt with Azalee Course PA.

## 2018-10-19 ENCOUNTER — Telehealth: Payer: Self-pay | Admitting: Physician Assistant

## 2018-10-19 NOTE — Telephone Encounter (Signed)
After three attempts to reach patient, and messages left to call back.  Will remove from Triage pool.

## 2018-10-19 NOTE — Telephone Encounter (Signed)
Left message. Will contact tomorrow to see if patient is interested to switch from office visit to a tele-visit

## 2018-10-20 ENCOUNTER — Telehealth: Payer: Self-pay | Admitting: Physician Assistant

## 2018-10-20 NOTE — Telephone Encounter (Signed)
Call goes straight to voicemail

## 2018-10-24 ENCOUNTER — Telehealth: Payer: Self-pay | Admitting: Physician Assistant

## 2018-10-24 NOTE — Telephone Encounter (Signed)
Called the patient, she was agreeable to switch to tele visit, will arrange for this Thur at [facility]

## 2018-10-24 NOTE — Telephone Encounter (Signed)
Left message again to cancel tomorrow's appointment. Unfortunately, still unable to reach the patient.

## 2018-10-25 ENCOUNTER — Ambulatory Visit: Payer: Medicare Other | Admitting: Physician Assistant

## 2018-10-25 NOTE — Telephone Encounter (Signed)
Patient is scheduled for telephone tele-health visit for 10-26-2018 at 1 PM

## 2018-10-26 ENCOUNTER — Telehealth (INDEPENDENT_AMBULATORY_CARE_PROVIDER_SITE_OTHER): Payer: Medicare Other | Admitting: Physician Assistant

## 2018-10-26 ENCOUNTER — Telehealth: Payer: Self-pay

## 2018-10-26 DIAGNOSIS — I482 Chronic atrial fibrillation, unspecified: Secondary | ICD-10-CM

## 2018-10-26 DIAGNOSIS — I480 Paroxysmal atrial fibrillation: Secondary | ICD-10-CM

## 2018-10-26 DIAGNOSIS — E785 Hyperlipidemia, unspecified: Secondary | ICD-10-CM

## 2018-10-26 DIAGNOSIS — I10 Essential (primary) hypertension: Secondary | ICD-10-CM

## 2018-10-26 DIAGNOSIS — I25119 Atherosclerotic heart disease of native coronary artery with unspecified angina pectoris: Secondary | ICD-10-CM

## 2018-10-26 DIAGNOSIS — I2510 Atherosclerotic heart disease of native coronary artery without angina pectoris: Secondary | ICD-10-CM

## 2018-10-26 DIAGNOSIS — E782 Mixed hyperlipidemia: Secondary | ICD-10-CM

## 2018-10-26 DIAGNOSIS — K921 Melena: Secondary | ICD-10-CM

## 2018-10-26 MED ORDER — AMLODIPINE BESYLATE 2.5 MG PO TABS: 2.5000 mg | ORAL_TABLET | Freq: Every day | ORAL | 0 refills | Status: DC

## 2018-10-26 NOTE — Progress Notes (Signed)
Virtual Visit via Telephone Note    Evaluation Performed:  Follow-up visit  This visit type was conducted due to national recommendations for restrictions regarding the COVID-19 Pandemic (e.g. social distancing).  This format is felt to be most appropriate for this patient at this time.  All issues noted in this document were discussed and addressed.  No physical exam was performed (except for noted visual exam findings with Video Visits).  Please refer to the patient's chart (MyChart message for video visits and phone note for telephone visits) for the patient's consent to telehealth for [facility].  Date:  10/28/2018   ID:  Lindsay [name], DOB [birth year], MRN [id number]  Patient Location:  Home  Provider location:   [facility]  PCP:  Patient, No Pcp Per  Cardiologist:  Chilton Si, MD  Electrophysiologist:  None   Chief Complaint:  Post PCI followup  History of Present Illness:    Lindsay [name] is a 63 y.o. female who presents via audio/video conferencing for a telehealth visit today.    Lindsay [name] is a pleasant 63 year old female with past medical history of hypertension, hyperlipidemia, history of CAD status post MI in 2010 and history of paroxysmal atrial fibrillation not on systemic anticoagulation secondary to GI bleed.  Previous cardiac catheterization in August 2016 revealed 50% RCA lesion but otherwise minimal disease.  Patient was recently seen by Dr. Duke Salvia in February 2020, at which time, she complained of progressive worsening chest discomfort during work.  She eventually underwent cardiac catheterization on 10/09/2018 this revealed 20% proximal RCA lesion, 50% distal RCA lesion, 75% D1 treated with synergy 2.25 x 16 mm DES, procedure was complicated with distal edge dissection which was treated with a second Synergy 2.25 x 8 mm DES.  EF was 55 to 65%. Postprocedure, patient was placed on aspirin and Plavix.  It was recommended to continue DAPT for at least 6 months with  aggressive secondary prevention.  Patient was contacted via telephone for telehealth visit today.  Since discharge, she has been compliant with dual antiplatelet therapy.  She has been doing well otherwise denying any lower extremity edema, orthopnea or PND.  Since yesterday, she did notice a recurrence of chest discomfort that appears to be very mild but persistent.  The degree of chest pain is about 1 out of 10 in intensity and much lighter than prior to cardiac catheterization.  I advised her to take nitroglycerin, if symptom worsens, she will need to seek urgent medical attention.  I added amlodipine 2.5 mg daily for antianginal purposes.  On further discussion, this mild chest discomfort is not exacerbated by deep inspiration, body rotation or palpation.  It is not associated with exertion either.  I will set her up to be seen by Dr. Duke Salvia in a few weeks for reassessment.  The patient does not have symptoms concerning for COVID-19 infection (fever, chills, cough, or new shortness of breath).    Prior CV studies:   The following studies were reviewed today:  Cath 10/09/2018  Ost 2nd Diag to 2nd Diag lesion is 30% stenosed.  Prox RCA lesion is 20% stenosed.  Dist RCA lesion is 50% stenosed.  Ost 1st Diag lesion is 90% stenosed.  Prox Cx lesion is 10% stenosed.  1st Diag lesion is 75% stenosed.  A drug-eluting stent was successfully placed using a STENT SYNERGY DES 2.25X16. A drug-eluting stent was successfully placed using a STENT SYNERGY DES 2.25X8 for distal edge dissection.  Post intervention, there is a 0% residual  stenosis.  The left ventricular systolic function is normal.  LV end diastolic pressure is normal.  The left ventricular ejection fraction is 55-65% by visual estimate.  There is no aortic valve stenosis.   DAPT for 6 months along with aggressive secondary prevention.    Plan for same day PCI.     Past Medical History:  Diagnosis Date  . Anginal pain  (HCC)   . Atrial fibrillation (HCC)   . Bipolar disorder (HCC)   . Coronary artery disease involving native coronary artery with angina pectoris (HCC) 03/24/2015  . Dysrhythmia   . GERD (gastroesophageal reflux disease)   . History of appendectomy 1963  . Hypertension   . Myocardial infarction (HCC)    per pt due to Vit D deficiency  . Vitamin D deficiency    Past Surgical History:  Procedure Laterality Date  . APPENDECTOMY    . CARDIAC CATHETERIZATION N/A 03/25/2015   Procedure: Left Heart Cath and Coronary Angiography;  Surgeon: Marykay Lex, MD;  Location: [facility] INVASIVE CV LAB;  Service: Cardiovascular;  Laterality: N/A;  . CORONARY STENT INTERVENTION N/A 10/09/2018   Procedure: CORONARY STENT INTERVENTION;  Surgeon: Corky Crafts, MD;  Location: [facility] INVASIVE CV LAB;  Service: Cardiovascular;  Laterality: N/A;  . DILATATION & CURETTAGE/HYSTEROSCOPY WITH MYOSURE N/A 05/19/2016   Procedure: DILATATION & CURETTAGE/HYSTEROSCOPY WITH MYOSURE;  Surgeon: Geryl Rankins, MD;  Location: [facility] ORS;  Service: Gynecology;  Laterality: N/A;  Possible Myosure for polyps.  [name] EYE SURGERY    . LEFT HEART CATH AND CORONARY ANGIOGRAPHY N/A 10/09/2018   Procedure: LEFT HEART CATH AND CORONARY ANGIOGRAPHY;  Surgeon: Corky Crafts, MD;  Location: [facility] INVASIVE CV LAB;  Service: Cardiovascular;  Laterality: N/A;  . MOUTH SURGERY    . URETHRAL DILATION       No outpatient medications have been marked as taking for the 10/26/18 encounter (Telemedicine) with Azalee Course, PA.     Allergies:   Bee venom; Sulfa antibiotics; Ciprofloxacin; Depakote [divalproex sodium]; Risperidone; Seroquel [quetiapine fumarate]; Statins; Tramadol; Citalopram; Lamotrigine; Topiramate; and Zetia [ezetimibe]   Social History   Tobacco Use  . Smoking status: Never Smoker  . Smokeless tobacco: Never Used  Substance Use Topics  . Alcohol use: Yes    Alcohol/week: 2.0 standard drinks    Types: 2 Standard drinks or  equivalent per week    Comment: rarely  . Drug use: No     Family Hx: The patient's family history includes Dementia in her mother; Heart attack in her maternal grandfather, maternal grandmother, and paternal grandfather; Kidney failure in her paternal grandmother; Vascular Disease in her mother.  ROS:   Please see the history of present illness.     All other systems reviewed and are negative.   Labs/Other Tests and Data Reviewed:    Recent Labs: 10/02/2018: ALT 24; BUN 13; Creatinine, Ser 0.71; Hemoglobin 13.9; Magnesium 1.7; Platelets 254; Potassium 5.0; Sodium 140; TSH 3.190   Recent Lipid Panel Lab Results  Component Value Date/Time   CHOL 287 (H) 10/02/2018 03:19 PM   TRIG 121 10/02/2018 03:19 PM   HDL 59 10/02/2018 03:19 PM   CHOLHDL 4.9 (H) 10/02/2018 03:19 PM   CHOLHDL 4.3 04/02/2016 03:54 PM   LDLCALC 204 (H) 10/02/2018 03:19 PM    Wt Readings from Last 3 Encounters:  10/09/18 188 lb (85.3 kg)  11/08/17 195 lb 12.8 oz (88.8 kg)  05/11/16 180 lb (81.6 kg)     Objective:    Vital Signs:  There were no vitals taken for this visit.   Well nourished, well developed female in no acute distress.   ASSESSMENT & PLAN:    1.  CAD: Recently underwent DES x2 to diagonal artery.  She does have some residual chest discomfort that occurred since yesterday.  It is proximal whether her current chest discomfort is related to the distal edge dissection that was present from the cardiac catheterization.  However the degree of chest discomfort is barely noticeable at 1 out of 10 in intensity.  I recommend monitoring at this time.  I added 2.5 mg daily of amlodipine for antianginal purposes.  She will need a reassessment in a few weeks by Dr. Duke Salvia.  During the visit she also mentioned some dark stools, I will order a CBC to make sure she is not anemic.  2. Hypertension: Amlodipine 2.5 mg daily was added for antianginal purposes.  She will need to monitor her blood pressure.  She  does not have a blood pressure cuff at home however she works at [employer] where she can obtain blood pressure for free.  3, Hyperlipidemia: She had a history of severe myalgia associated with statins.  We discussed possibility of PCSK9 inhibitor, she is interested in trying.  I plan to refer her to the lipid clinic once COVID-19 pandemic is controlled.  4. Paroxysmal atrial fibrillation: Not on anticoagulation due to history of GI bleed.  COVID-19 Education: The signs and symptoms of COVID-19 were discussed with the patient and how to seek care for testing (follow up with PCP or arrange E-visit).  The importance of social distancing was discussed today.  Patient Risk:   After full review of this patient's clinical status, I feel that they are at least moderate risk at this time.  Time:   Today, I have spent 49 minutes with the patient with telehealth technology discussing cardiac care.     Medication Adjustments/Labs and Tests Ordered: Current medicines are reviewed at length with the patient today.  Concerns regarding medicines are outlined above.  Tests Ordered: Orders Placed This Encounter  Procedures  . CBC   Medication Changes: Meds ordered this encounter  Medications  . amLODipine (NORVASC) 2.5 MG tablet    Sig: Take 1 tablet (2.5 mg total) by mouth daily.    Dispense:  90 tablet    Refill:  0    Disposition:  Follow up in 2 week(s)  Signed, Azalee Course, PA  10/28/2018 12:06 AM    [facility]

## 2018-10-26 NOTE — Telephone Encounter (Signed)
Left detailed voice message for the patient to call back with her off day to schedule her 2 week E-visit follow up with Dr. Duke Salvia per Azalee Course, PA-C

## 2018-10-26 NOTE — Telephone Encounter (Signed)
Virtual Visit Pre-Appointment Phone Call  Steps For Call:  1. Confirm consent - "In the setting of the current Covid19 crisis, you are scheduled for a (phone or video) visit with your provider on (date) at (time).  Just as we do with many in-office visits, in order for you to participate in this visit, we must obtain consent.  If you'd like, I can send this to your mychart (if signed up) or email for you to review.  Otherwise, I can obtain your verbal consent now.  All virtual visits are billed to your insurance company just like a normal visit would be.  By agreeing to a virtual visit, we'd like you to understand that the technology does not allow for your provider to perform an examination, and thus may limit your provider's ability to fully assess your condition.  Finally, though the technology is pretty good, we cannot assure that it will always work on either your or our end, and in the setting of a video visit, we may have to convert it to a phone-only visit.  In either situation, we cannot ensure that we have a secure connection.  Are you willing to proceed?"  2. Give patient instructions for WebEx download to smartphone as below if video visit  3. Advise patient to be prepared with any vital sign or heart rhythm information, their current medicines, and a piece of paper and pen handy for any instructions they may receive the day of their visit  4. Inform patient they will receive a phone call 15 minutes prior to their appointment time (may be from unknown caller ID) so they should be prepared to answer  5. Confirm that appointment type is correct in Epic appointment notes (video vs telephone)    TELEPHONE CALL NOTE  Lindsay [name] has been deemed a candidate for a follow-up tele-health visit to limit community exposure during the Covid-19 pandemic. I spoke with the patient via phone to ensure availability of phone/video source, confirm preferred email & phone number, and discuss  instructions and expectations.  I reminded Lindsay [name] to be prepared with any vital sign and/or heart rhythm information that could potentially be obtained via home monitoring, at the time of her visit. I reminded Lindsay [name] to expect a phone call at the time of her visit if her visit.  Did the patient verbally acknowledge consent to treatment? Yes  Lindsay [name], [facility] 10/26/2018 12:48 PM   DOWNLOADING THE WEBEX SOFTWARE TO SMARTPHONE  - If Apple, go to [employer] and type in WebEx in the search bar. Download [employer], the blue/green circle. The app is free but as with any other app downloads, their phone may require them to verify saved payment information or Apple password. The patient does NOT have to create an account.  - If Android, ask patient to go to [employer] and type in WebEx in the search bar. Download [employer], the blue/green circle. The app is free but as with any other app downloads, their phone may require them to verify saved payment information or Android password. The patient does NOT have to create an account.   CONSENT FOR TELE-HEALTH VISIT - PLEASE REVIEW  I hereby voluntarily request, consent and authorize [facility] and its employed or contracted physicians, physician assistants, nurse practitioners or other licensed health care professionals (the Practitioner), to provide me with telemedicine health care services (the "Services") as deemed necessary by the treating Practitioner. I acknowledge and consent  to receive the Services by the Practitioner via telemedicine. I understand that the telemedicine visit will involve communicating with the Practitioner through live audiovisual communication technology and the disclosure of certain medical information by electronic transmission. I acknowledge that I have been given the opportunity to request an in-person assessment or other available alternative prior to the telemedicine visit and am  voluntarily participating in the telemedicine visit.  I understand that I have the right to withhold or withdraw my consent to the use of telemedicine in the course of my care at any time, without affecting my right to future care or treatment, and that the Practitioner or I may terminate the telemedicine visit at any time. I understand that I have the right to inspect all information obtained and/or recorded in the course of the telemedicine visit and may receive copies of available information for a reasonable fee.  I understand that some of the potential risks of receiving the Services via telemedicine include:  [name] Delay or interruption in medical evaluation due to technological equipment failure or disruption; . Information transmitted may not be sufficient (e.g. poor resolution of images) to allow for appropriate medical decision making by the Practitioner; and/or  . In rare instances, security protocols could fail, causing a breach of personal health information.  Furthermore, I acknowledge that it is my responsibility to provide information about my medical history, conditions and care that is complete and accurate to the best of my ability. I acknowledge that Practitioner's advice, recommendations, and/or decision may be based on factors not within their control, such as incomplete or inaccurate data provided by me or distortions of diagnostic images or specimens that may result from electronic transmissions. I understand that the practice of medicine is not an exact science and that Practitioner makes no warranties or guarantees regarding treatment outcomes. I acknowledge that I will receive a copy of this consent concurrently upon execution via email to the email address I last provided but may also request a printed copy by calling the office of [place].    I understand that my insurance will be billed for this visit.   I have read or had this consent read to me. . I understand the  contents of this consent, which adequately explains the benefits and risks of the Services being provided via telemedicine.  . I have been provided ample opportunity to ask questions regarding this consent and the Services and have had my questions answered to my satisfaction. . I give my informed consent for the services to be provided through the use of telemedicine in my medical care  By participating in this telemedicine visit I agree to the above.

## 2018-10-26 NOTE — Telephone Encounter (Signed)
Called patient left vice message for her to call back involving her TeleHealth visit

## 2018-10-26 NOTE — Patient Instructions (Addendum)
Medication Instructions:   START Amlodipine to 2.5 Mg daily   If you need a refill on your cardiac medications before your next appointment, please call your pharmacy.   Lab work:  You will need to come to our office to have labs (blood work) drawn in 2 week: CBC   If you have labs (blood work) drawn today and your tests are completely normal, you will receive your results only by: [name] MyChart Message (if you have MyChart) OR . A paper copy in the mail If you have any lab test that is abnormal or we need to change your treatment, we will call you to review the results.  Testing/Procedures:  None ordered at the time of this appointment   Follow-Up: At [facility], you and your health needs are our priority.  As part of our continuing mission to provide you with exceptional heart care, we have created designated Provider Care Teams.  These Care Teams include your primary Cardiologist (physician) and Advanced Practice Providers (APPs -  Physician Assistants and Nurse Practitioners) who all work together to provide you with the care you need, when you need it. You will need an E-visit follow up appointment in 2 weeks Chilton Si, MD   Any Other Special Instructions Will Be Listed Below (If Applicable).

## 2018-10-27 ENCOUNTER — Telehealth: Payer: Self-pay | Admitting: Cardiovascular Disease

## 2018-10-27 NOTE — Telephone Encounter (Signed)
Patient recently had heart cath and stent placed about two weeks ago, she had a virtual visit with Azalee Course yesterday.  She has seasonal allergies, and just passed her cough to that.  However today is worst so she is called out from work, she works in [employer].  She states she is just not feeling well. She does not have a thermometer to check her temp.

## 2018-10-27 NOTE — Telephone Encounter (Signed)
Left message to call back  

## 2018-10-28 ENCOUNTER — Other Ambulatory Visit: Payer: Self-pay | Admitting: Physician Assistant

## 2018-10-30 ENCOUNTER — Other Ambulatory Visit: Payer: Self-pay

## 2018-10-30 DIAGNOSIS — K921 Melena: Secondary | ICD-10-CM

## 2018-10-30 NOTE — Telephone Encounter (Signed)
Attempted to contact patient, LVM to call back to discuss how she was feeling.

## 2018-10-31 NOTE — Telephone Encounter (Signed)
Called patient again, LVM to call back to discuss how she was feeling.   Patient will be removed from triage as this is x3 call.

## 2018-11-03 ENCOUNTER — Telehealth: Payer: Self-pay

## 2018-11-03 NOTE — Telephone Encounter (Signed)
LEFT MESSAGE FOR PT TO CALL OFFICE FOR APPT CHANGE TO VIRTUAL

## 2018-11-07 ENCOUNTER — Telehealth: Payer: Self-pay | Admitting: *Deleted

## 2018-11-07 NOTE — Telephone Encounter (Signed)
Left message to call back  

## 2018-11-07 NOTE — Telephone Encounter (Signed)
-----   Message from [name], CMA sent at 10/26/2018  2:17 PM EDT ----- Regarding: Need 2 week E-visit follow Hello Juliette Alcide,  How had a telephone E-visit with the above mentioned patient. He would like for her to follow up with Dr. Duke Salvia in 2 weeks also as an E-visit. I left a message for the patient to call back with her off day and what time works best for her. Thank you and have a great day!!  Gaspar Cola

## 2018-11-09 ENCOUNTER — Encounter (HOSPITAL_BASED_OUTPATIENT_CLINIC_OR_DEPARTMENT_OTHER): Payer: Medicare Other

## 2018-11-14 NOTE — Telephone Encounter (Signed)
Left message to call back, patient needs virtual visit with Dr Duke Salvia

## 2018-11-22 ENCOUNTER — Ambulatory Visit: Payer: Medicare Other | Admitting: Physician Assistant

## 2018-12-08 DIAGNOSIS — R05 Cough: Secondary | ICD-10-CM | POA: Diagnosis not present

## 2018-12-08 DIAGNOSIS — Z1159 Encounter for screening for other viral diseases: Secondary | ICD-10-CM | POA: Diagnosis not present

## 2018-12-08 DIAGNOSIS — K589 Irritable bowel syndrome without diarrhea: Secondary | ICD-10-CM | POA: Diagnosis not present

## 2018-12-08 DIAGNOSIS — M179 Osteoarthritis of knee, unspecified: Secondary | ICD-10-CM | POA: Diagnosis not present

## 2018-12-08 DIAGNOSIS — K219 Gastro-esophageal reflux disease without esophagitis: Secondary | ICD-10-CM | POA: Diagnosis not present

## 2018-12-08 DIAGNOSIS — E669 Obesity, unspecified: Secondary | ICD-10-CM | POA: Diagnosis not present

## 2018-12-08 DIAGNOSIS — E782 Mixed hyperlipidemia: Secondary | ICD-10-CM | POA: Diagnosis not present

## 2018-12-08 DIAGNOSIS — F329 Major depressive disorder, single episode, unspecified: Secondary | ICD-10-CM | POA: Diagnosis not present

## 2018-12-08 DIAGNOSIS — M5431 Sciatica, right side: Secondary | ICD-10-CM | POA: Diagnosis not present

## 2018-12-08 DIAGNOSIS — I10 Essential (primary) hypertension: Secondary | ICD-10-CM | POA: Diagnosis not present

## 2018-12-08 DIAGNOSIS — E559 Vitamin D deficiency, unspecified: Secondary | ICD-10-CM | POA: Diagnosis not present

## 2018-12-12 DIAGNOSIS — M654 Radial styloid tenosynovitis [de Quervain]: Secondary | ICD-10-CM | POA: Diagnosis not present

## 2018-12-12 DIAGNOSIS — E559 Vitamin D deficiency, unspecified: Secondary | ICD-10-CM | POA: Diagnosis not present

## 2018-12-14 DIAGNOSIS — M25561 Pain in right knee: Secondary | ICD-10-CM | POA: Diagnosis not present

## 2018-12-14 DIAGNOSIS — M25562 Pain in left knee: Secondary | ICD-10-CM | POA: Diagnosis not present

## 2018-12-14 HISTORY — DX: Pain in right knee: M25.561

## 2018-12-19 ENCOUNTER — Telehealth: Payer: Self-pay | Admitting: Physician Assistant

## 2018-12-19 NOTE — Telephone Encounter (Signed)
Called to pre reg patient for 12/20/2018 visit, no answer, left message -- ttf

## 2018-12-20 ENCOUNTER — Telehealth: Payer: Self-pay | Admitting: *Deleted

## 2018-12-20 ENCOUNTER — Telehealth (INDEPENDENT_AMBULATORY_CARE_PROVIDER_SITE_OTHER): Payer: Medicare Other | Admitting: Physician Assistant

## 2018-12-20 VITALS — Ht 62.0 in | Wt 184.0 lb

## 2018-12-20 DIAGNOSIS — I2510 Atherosclerotic heart disease of native coronary artery without angina pectoris: Secondary | ICD-10-CM

## 2018-12-20 DIAGNOSIS — E785 Hyperlipidemia, unspecified: Secondary | ICD-10-CM

## 2018-12-20 DIAGNOSIS — I480 Paroxysmal atrial fibrillation: Secondary | ICD-10-CM

## 2018-12-20 DIAGNOSIS — I10 Essential (primary) hypertension: Secondary | ICD-10-CM

## 2018-12-20 DIAGNOSIS — R400 Somnolence: Secondary | ICD-10-CM

## 2018-12-20 NOTE — Progress Notes (Addendum)
Virtual Visit via Telephone Note   This visit type was conducted due to national recommendations for restrictions regarding the COVID-19 Pandemic (e.g. social distancing) in an effort to limit this patient's exposure and mitigate transmission in our community.  Due to her co-morbid illnesses, this patient is at least at moderate risk for complications without adequate follow up.  This format is felt to be most appropriate for this patient at this time.  The patient did not have access to video technology/had technical difficulties with video requiring transitioning to audio format only (telephone).  All issues noted in this document were discussed and addressed.  No physical exam could be performed with this format.  Please refer to the patient's chart for her  consent to telehealth for [facility].   Date:  12/20/2018   ID:  Lindsay [name], DOB Oct 03, 1955, MRN [id number]  Patient Location: Home Provider Location: Home  PCP:  Patient, No Pcp Per  Cardiologist:  Chilton Si, MD  Electrophysiologist:  None   Evaluation Performed:  Follow-Up Visit  Chief Complaint:  followup  History of Present Illness:    Lindsay [name] is a 63 y.o. female with past medical history of hypertension, hyperlipidemia, history of CAD s/p MI in 2010 and history of paroxysmal atrial fibrillation not on systemic anticoagulation secondary to GI bleed.  Previous cardiac catheterization in August 2016 revealed 50% RCA lesion but otherwise minimal disease.  Patient was recently seen by Dr. Duke Salvia in February 2020, at which time, she complained of progressive worsening chest discomfort during work.  She eventually underwent cardiac catheterization on 10/09/2018 this revealed 20% proximal RCA lesion, 50% distal RCA lesion, 75% D1 treated with synergy 2.25 x 16 mm DES, procedure was complicated with distal edge dissection which was treated with a second Synergy 2.25 x 8 mm DES.  EF was 55 to 65%. Postprocedure, patient was  placed on aspirin and Plavix.  It was recommended to continue DAPT for at least 6 months with aggressive secondary prevention.  I spoke to the patient on 10/26/2018 via telehealth visit.  She was compliant with dual antiplatelet therapy at the time.  She also noticed occasional very vague chest discomfort.  I added 2.5 mg daily of amlodipine for antianginal purposes.  Patient was followed up today using telephone visit.  She denies any further chest discomfort.  She is ready to go back to work at [employer].  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  Otherwise she denies any lower extremity edema, orthopnea or PND.  I think she is quite stable from cardiology perspective. She has some daytime somnolence, will order sleep study.   The patient does not have symptoms concerning for COVID-19 infection (fever, chills, cough, or new shortness of breath).    Past Medical History:  Diagnosis Date  . Anginal pain (HCC)   . Atrial fibrillation (HCC)   . Bipolar disorder (HCC)   . Coronary artery disease involving native coronary artery with angina pectoris (HCC) 03/24/2015  . Dysrhythmia   . GERD (gastroesophageal reflux disease)   . History of appendectomy 1963  . Hypertension   . Myocardial infarction (HCC)    per pt due to Vit D deficiency  . Vitamin D deficiency    Past Surgical History:  Procedure Laterality Date  . APPENDECTOMY    . CARDIAC CATHETERIZATION N/A 03/25/2015   Procedure: Left Heart Cath and Coronary  Angiography;  Surgeon: Marykay Lexavid W Harding, MD;  [facility];  Service: Cardiovascular;  Laterality: N/A;  . CORONARY STENT INTERVENTION N/A 10/09/2018   Procedure: CORONARY STENT INTERVENTION;  Surgeon: Corky CraftsVaranasi, Jayadeep S, MD;  Location: [facility] INVASIVE CV LAB;  Service: Cardiovascular;  Laterality: N/A;  . DILATATION & CURETTAGE/HYSTEROSCOPY WITH MYOSURE N/A 05/19/2016    Procedure: DILATATION & CURETTAGE/HYSTEROSCOPY WITH MYOSURE;  Surgeon: Geryl RankinsEvelyn Varnado, MD;  Location: [facility] ORS;  Service: Gynecology;  Laterality: N/A;  Possible Myosure for polyps.  [name]. EYE SURGERY    . LEFT HEART CATH AND CORONARY ANGIOGRAPHY N/A 10/09/2018   Procedure: LEFT HEART CATH AND CORONARY ANGIOGRAPHY;  Surgeon: Corky CraftsVaranasi, Jayadeep S, MD;  [facility];  Service: Cardiovascular;  Laterality: N/A;  . MOUTH SURGERY    . URETHRAL DILATION       Current Meds  Medication Sig  . amLODipine (NORVASC) 2.5 MG tablet Take 1 tablet (2.5 mg total) by mouth daily.  [name]. aspirin EC 81 MG tablet Take 81 mg by mouth at bedtime.   [name]. b complex vitamins tablet Take 1 tablet by mouth daily.  [name]. BIOTIN PO Take 1 tablet by mouth daily.  . calcium carbonate (TUMS - DOSED IN MG ELEMENTAL CALCIUM) 500 MG chewable tablet Chew 2 tablets by mouth 3 (three) times daily as needed for indigestion or heartburn.   . clopidogrel (PLAVIX) 75 MG tablet Take 1 tablet (75 mg total) by mouth daily with breakfast.  . Coenzyme Q10 (COQ-10 PO) Take 1 tablet by mouth daily.  . Cyanocobalamin (B-12 PO) Take 1 tablet by mouth daily.  . DULoxetine (CYMBALTA) 30 MG capsule Take 30 mg by mouth daily.  [name]. esomeprazole (NEXIUM) 20 MG capsule Take 20 mg by mouth daily.   . hydrOXYzine (VISTARIL) 50 MG capsule Take 50 mg by mouth at bedtime as needed for anxiety (sleep).   [name]. ibuprofen (ADVIL,MOTRIN) 200 MG tablet Take 200 mg by mouth 2 (two) times daily as needed for moderate pain.  [name]. losartan (COZAAR) 100 MG tablet Take 100 mg by mouth daily.  . metoprolol tartrate (LOPRESSOR) 25 MG tablet TAKE 1/2 TAB (12.5 MG TOTAL) BY MOUTH 2 (TWO) TIMES DAILY. (Patient taking differently: Take 12.5 mg by mouth 2 (two) times daily. )  . nitroGLYCERIN (NITROSTAT) 0.4 MG SL tablet Place 1 tablet (0.4 mg total) under the tongue as needed for chest pain.  . Omega-3 Fatty Acids (OMEGA 3 PO) Take 1 capsule by mouth daily.  [name]. oxymetazoline  (AFRIN) 0.05 % nasal spray Place 1 spray into both nostrils daily as needed for congestion.  [name]. VITAMIN D PO Take 2 capsules by mouth daily.  . [DISCONTINUED] Prenatal Vit-Fe Fumarate-FA (PRENATAL MULTIVITAMIN) TABS tablet Take 1 tablet by mouth daily.      Allergies:   Bee venom; Sulfa antibiotics; Ciprofloxacin; Depakote [divalproex sodium]; Risperidone; Seroquel [quetiapine fumarate]; Statins; Tramadol; Citalopram; Lamotrigine; Topiramate; and Zetia [ezetimibe]   Social History   Tobacco Use  . Smoking status: Never Smoker  . Smokeless tobacco: Never Used  Substance Use Topics  . Alcohol use: Yes    Alcohol/week: 2.0 standard drinks    Types: 2 Standard drinks or equivalent per week    Comment: rarely  . Drug use: No     Family Hx: The patient's family history includes Dementia in her mother; Heart attack in her maternal grandfather, maternal grandmother, and paternal grandfather; Kidney failure in her paternal grandmother; Vascular Disease in her mother.  ROS:  Please see the history of present illness.     All other systems reviewed and are negative.   Prior CV studies:   The following studies were reviewed today:  Cath 10/09/2018  Ost 2nd Diag to 2nd Diag lesion is 30% stenosed.  Prox RCA lesion is 20% stenosed.  Dist RCA lesion is 50% stenosed.  Ost 1st Diag lesion is 90% stenosed.  Prox Cx lesion is 10% stenosed.  1st Diag lesion is 75% stenosed.  A drug-eluting stent was successfully placed using a STENT SYNERGY DES 2.25X16. A drug-eluting stent was successfully placed using a STENT SYNERGY DES 2.25X8 for distal edge dissection.  Post intervention, there is a 0% residual stenosis.  The left ventricular systolic function is normal.  LV end diastolic pressure is normal.  The left ventricular ejection fraction is 55-65% by visual estimate.  There is no aortic valve stenosis.   DAPT for 6 months along with aggressive secondary prevention.      Labs/Other Tests and Data Reviewed:    EKG:  An ECG dated 09/14/2018 was personally reviewed today and demonstrated:  Normal sinus rhythm with T wave inversion in the anterolateral leads.  Recent Labs: 10/02/2018: ALT 24; BUN 13; Creatinine, Ser 0.71; Hemoglobin 13.9; Magnesium 1.7; Platelets 254; Potassium 5.0; Sodium 140; TSH 3.190   Recent Lipid Panel Lab Results  Component Value Date/Time   CHOL 287 (H) 10/02/2018 03:19 PM   TRIG 121 10/02/2018 03:19 PM   HDL 59 10/02/2018 03:19 PM   CHOLHDL 4.9 (H) 10/02/2018 03:19 PM   CHOLHDL 4.3 04/02/2016 03:54 PM   LDLCALC 204 (H) 10/02/2018 03:19 PM    Wt Readings from Last 3 Encounters:  12/20/18 184 lb (83.5 kg)  10/09/18 188 lb (85.3 kg)  11/08/17 195 lb 12.8 oz (88.8 kg)     Objective:    Vital Signs:  Ht  (1.575 m)   Wt 184 lb (83.5 kg)   BMI 33.65 kg/m    VITAL SIGNS:  reviewed  ASSESSMENT & PLAN:    1. CAD: Previously had DES placed in diagonal artery in March.  Postprocedure, patient had intermittent chest discomfort.  I added low-dose amlodipine to her medical regimen.  Chest pain has since resolved.  2. Hypertension: Continue on current regimen  3. Hyperlipidemia: On omega-3 fatty acid.  She is intolerant to statins.  4. History of paroxysmal atrial fibrillation: Not on systemic anticoagulation due to GI bleed.  No recent recurrence  5. Daytime somnolence: obtain sleep study  COVID-19 Education: The signs and symptoms of COVID-19 were discussed with the patient and how to seek care for testing (follow up with PCP or arrange E-visit).  The importance of social distancing was discussed today.  Time:   Today, I have spent 13 minutes with the patient with telehealth technology discussing the above problems.     Medication Adjustments/Labs and Tests Ordered: Current medicines are reviewed at length with the patient today.  Concerns regarding medicines are outlined above.   Tests Ordered: No orders of the  defined types were placed in this encounter.   Medication Changes: No orders of the defined types were placed in this encounter.   Disposition:  Follow up in 4 month(s)  Signed, Azalee Course, [place]  12/20/2018 11:15 AM    [facility]

## 2018-12-20 NOTE — Patient Instructions (Signed)
Medication Instructions:   Your physician recommends that you continue on your current medications as directed. Please refer to the Current Medication list given to you today.  If you need a refill on your cardiac medications before your next appointment, please call your pharmacy.   Lab work:  NONE ordered at this time of appointment   If you have labs (blood work) drawn today and your tests are completely normal, you will receive your results only by: [name] MyChart Message (if you have MyChart) OR . A paper copy in the mail If you have any lab test that is abnormal or we need to change your treatment, we will call you to review the results.  Testing/Procedures:  NONE ordered at this time of appointment   Follow-Up: At [facility], you and your health needs are our priority.  As part of our continuing mission to provide you with exceptional heart care, we have created designated Provider Care Teams.  These Care Teams include your primary Cardiologist (physician) and Advanced Practice Providers (APPs -  Physician Assistants and Nurse Practitioners) who all work together to provide you with the care you need, when you need it. You will need a follow up appointment in 4-5 months.  Please call our office 2 months in advance to schedule this appointment.  You may see Chilton Si, MD or one of the following Advanced Practice Providers on your designated Care Team:   Corine Shelter, PA-C Judy Pimple, [place] . Marjie Skiff, PA-C  Any Other Special Instructions Will Be Listed Below (If Applicable).

## 2018-12-20 NOTE — Telephone Encounter (Signed)
LVM for patient to call back so I could prepare her for her virtual visit with Azalee Course at 11:00 today.

## 2018-12-21 DIAGNOSIS — M25561 Pain in right knee: Secondary | ICD-10-CM | POA: Diagnosis not present

## 2018-12-25 DIAGNOSIS — M542 Cervicalgia: Secondary | ICD-10-CM | POA: Diagnosis not present

## 2018-12-25 DIAGNOSIS — M5030 Other cervical disc degeneration, unspecified cervical region: Secondary | ICD-10-CM | POA: Diagnosis not present

## 2018-12-25 DIAGNOSIS — R200 Anesthesia of skin: Secondary | ICD-10-CM | POA: Diagnosis not present

## 2018-12-26 ENCOUNTER — Other Ambulatory Visit (HOSPITAL_COMMUNITY): Admission: RE | Admit: 2018-12-26 | Payer: Medicare Other | Source: Ambulatory Visit

## 2018-12-27 ENCOUNTER — Other Ambulatory Visit (HOSPITAL_COMMUNITY): Payer: Medicare Other

## 2018-12-27 ENCOUNTER — Telehealth: Payer: Self-pay | Admitting: Physician Assistant

## 2018-12-27 NOTE — Telephone Encounter (Signed)
Returned call to patient no answer.LMTC. 

## 2018-12-27 NOTE — Telephone Encounter (Signed)
Follow up ° ° °Patient is returning your call. Please call. ° ° ° °

## 2018-12-27 NOTE — Telephone Encounter (Signed)
New message   Patient is returning your call from 12/20/18. Please call the patient.

## 2018-12-27 NOTE — Telephone Encounter (Signed)
New message  Patient states that she is having blood clot issues and leg and memory issues. Please call to the patient.

## 2018-12-29 ENCOUNTER — Encounter (HOSPITAL_BASED_OUTPATIENT_CLINIC_OR_DEPARTMENT_OTHER): Payer: Medicare Other

## 2019-01-01 DIAGNOSIS — K219 Gastro-esophageal reflux disease without esophagitis: Secondary | ICD-10-CM | POA: Diagnosis not present

## 2019-01-01 DIAGNOSIS — T148XXA Other injury of unspecified body region, initial encounter: Secondary | ICD-10-CM | POA: Diagnosis not present

## 2019-01-01 DIAGNOSIS — Z79899 Other long term (current) drug therapy: Secondary | ICD-10-CM | POA: Diagnosis not present

## 2019-01-01 DIAGNOSIS — I10 Essential (primary) hypertension: Secondary | ICD-10-CM | POA: Diagnosis not present

## 2019-01-01 DIAGNOSIS — H1131 Conjunctival hemorrhage, right eye: Secondary | ICD-10-CM | POA: Diagnosis not present

## 2019-01-01 NOTE — Progress Notes (Signed)
[facility] - Clinic Note  01/02/2019     CHIEF COMPLAINT Patient presents for Retina Evaluation   HISTORY OF PRESENT ILLNESS: Lindsay [name] is a 63 y.o. female who presents to the clinic today for:   HPI    Retina Evaluation    In both eyes.  This started 1 day ago.  Duration of 1 day.  Associated Symptoms Floaters, Photophobia, Pain, Glare and Redness.  Context:  distance vision, mid-range vision, near vision, reading and night driving.  Treatments tried include no treatments.  I, the attending physician,  performed the HPI with the patient and updated documentation appropriately.          Comments    63 y/o female pt referred by Jarrett Soho, PA-C for eval of hematoma OD.  Pt woke up yesterday morning and noticed OD was very red, and there was blood all over the eye.  Pt also has a small spot of blood on the temporal side of her left eye.  VA OD is now a bit blurred, and OD is very irritated.  OD hurts a bit as well.  Pain level at about a 2 today.  Pt has also been seeing an intermittent black "curtain" superiorly OS for a few days, and has noticed an increase in dark floaters OS.  Denies flashes.  Hx of sub-conj hemes.  Pt is on blood thinners (Plavix, ASA), and been very stressed out lately and not sleeping much.  No gtts.  New onset flashes/floaters OS -- Sunday night, Monday morning       Last edited by Rennis Chris, MD on 01/02/2019 11:51 AM. (History)      Referring physician: Jarrett Soho, PA-C [address] [place], [place] [address]  HISTORICAL INFORMATION:   Selected notes from the MEDICAL RECORD NUMBER Referred by Jarrett Soho, PA-C for concern of hematoma OD LEE:  Ocular Hx- PMH-   CURRENT MEDICATIONS: No current outpatient medications on file. (Ophthalmic Drugs)   No current facility-administered medications for this visit.  (Ophthalmic Drugs)   Current Outpatient Medications (Other)  Medication Sig  . amLODipine (NORVASC)  2.5 MG tablet Take 1 tablet (2.5 mg total) by mouth daily.  [name] aspirin EC 81 MG tablet Take 81 mg by mouth at bedtime.   [name] b complex vitamins tablet Take 1 tablet by mouth daily.  [name] BIOTIN PO Take 1 tablet by mouth daily.  . calcium carbonate (TUMS - DOSED IN MG ELEMENTAL CALCIUM) 500 MG chewable tablet Chew 2 tablets by mouth 3 (three) times daily as needed for indigestion or heartburn.   . clopidogrel (PLAVIX) 75 MG tablet Take 1 tablet (75 mg total) by mouth daily with breakfast.  . Coenzyme Q10 (COQ-10 PO) Take 1 tablet by mouth daily.  . Cyanocobalamin (B-12 PO) Take 1 tablet by mouth daily.  . diclofenac sodium (VOLTAREN) 1 % GEL diclofenac 1 % topical gel  APPLY TWO GRAMS TOPICALLY TO AFFECTED AREA TWO TIMES A DAY  . DULoxetine (CYMBALTA) 30 MG capsule Take 30 mg by mouth daily.  [name] esomeprazole (NEXIUM) 20 MG capsule Take 20 mg by mouth daily.   . hydrOXYzine (VISTARIL) 50 MG capsule Take 50 mg by mouth at bedtime as needed for anxiety (sleep).   [name] ibuprofen (ADVIL,MOTRIN) 200 MG tablet Take 200 mg by mouth 2 (two) times daily as needed for moderate pain.  [name] losartan (COZAAR) 100 MG tablet Take 100 mg by mouth daily.  . metoprolol tartrate (LOPRESSOR) 25 MG  tablet TAKE 1/2 TAB (12.5 MG TOTAL) BY MOUTH 2 (TWO) TIMES DAILY. (Patient taking differently: Take 12.5 mg by mouth 2 (two) times daily. )  . nitroGLYCERIN (NITROSTAT) 0.4 MG SL tablet Place 1 tablet (0.4 mg total) under the tongue as needed for chest pain.  . Omega-3 Fatty Acids (OMEGA 3 PO) Take 1 capsule by mouth daily.  [name]. oxymetazoline (AFRIN) 0.05 % nasal spray Place 1 spray into both nostrils daily as needed for congestion.  . predniSONE (DELTASONE) 10 MG tablet prednisone 10 mg tablet  Take 6 tablets po x1 day, 5 tablets po x 1 day, 4 tablets po x 1 day, 3 tablets po x 1 day, 2 tablets po x 1 day, 1 tablet po x 1 day  . VITAMIN D PO Take 2 capsules by mouth daily.   No current facility-administered medications for this visit.   (Other)      REVIEW OF SYSTEMS: ROS    Positive for: Gastrointestinal, Musculoskeletal, Cardiovascular, Eyes, Psychiatric   Negative for: Constitutional, Neurological, Skin, Genitourinary, HENT, Endocrine, Respiratory, Allergic/Imm, Heme/Lymph   Last edited by Celine MansBaxley, Andrew G, COA on 01/02/2019 10:07 AM. (History)       ALLERGIES Allergies  Allergen Reactions  . Bee Venom Anaphylaxis  . Sulfa Antibiotics Rash and Swelling  . Ciprofloxacin Nausea And Vomiting  . Depakote [Divalproex Sodium] Other (See Comments)    Unknown  . Risperidone Other (See Comments)    Unknown  . Seroquel [Quetiapine Fumarate] Other (See Comments)    Nightmares  . Statins Other (See Comments)    joint pain, weakness  . Tramadol Other (See Comments)    Serotonin syndrome  . Citalopram Rash    insomnia, agitation  . Lamotrigine Itching and Rash    Other reaction(s): brown urine  . Topiramate Rash    Nightmares, brown urine   . Zetia [Ezetimibe]     Joint pain, weakness     PAST MEDICAL HISTORY Past Medical History:  Diagnosis Date  . Anginal pain (HCC)   . Atrial fibrillation (HCC)   . Bipolar disorder (HCC)   . Coronary artery disease involving native coronary artery with angina pectoris (HCC) 03/24/2015  . Dysrhythmia   . GERD (gastroesophageal reflux disease)   . History of appendectomy 1963  . Hypertension   . Myocardial infarction (HCC)    per pt due to Vit D deficiency  . Vitamin D deficiency    Past Surgical History:  Procedure Laterality Date  . APPENDECTOMY    . CARDIAC CATHETERIZATION N/A 03/25/2015   Procedure: Left Heart Cath and Coronary Angiography;  Surgeon: Marykay Lexavid W Harding, MD;  [facility];  Service: Cardiovascular;  Laterality: N/A;  . CORONARY STENT INTERVENTION N/A 10/09/2018   Procedure: CORONARY STENT INTERVENTION;  Surgeon: Corky CraftsVaranasi, Jayadeep S, MD;  Location: [facility] INVASIVE CV LAB;  Service: Cardiovascular;  Laterality: N/A;  . DILATATION &  CURETTAGE/HYSTEROSCOPY WITH MYOSURE N/A 05/19/2016   Procedure: DILATATION & CURETTAGE/HYSTEROSCOPY WITH MYOSURE;  Surgeon: Geryl RankinsEvelyn Varnado, MD;  Location: [facility] ORS;  Service: Gynecology;  Laterality: N/A;  Possible Myosure for polyps.  [name]. EYE SURGERY    . LEFT HEART CATH AND CORONARY ANGIOGRAPHY N/A 10/09/2018   Procedure: LEFT HEART CATH AND CORONARY ANGIOGRAPHY;  Surgeon: Corky CraftsVaranasi, Jayadeep S, MD;  [facility];  Service: Cardiovascular;  Laterality: N/A;  . MOUTH SURGERY    . URETHRAL DILATION      FAMILY HISTORY Family History  Problem Relation Age of Onset  .  Vascular Disease Mother   . Dementia Mother   . Heart attack Maternal Grandmother   . Heart attack Maternal Grandfather   . Kidney failure Paternal Grandmother   . Heart attack Paternal Grandfather     SOCIAL HISTORY Social History   Tobacco Use  . Smoking status: Never Smoker  . Smokeless tobacco: Never Used  Substance Use Topics  . Alcohol use: Yes    Alcohol/week: 2.0 standard drinks    Types: 2 Standard drinks or equivalent per week    Comment: rarely  . Drug use: No         OPHTHALMIC EXAM:  Base Eye Exam    Visual Acuity (Snellen - Linear)      Right Left   Dist cc 20/20 20/15 -2   Correction:  Glasses       Tonometry (Tonopen, 10:13 AM)      Right Left   Pressure 15 17       Pupils      Dark Light Shape React APD   Right 4 3 Round Brisk None   Left 4 3 Round Brisk None       Visual Fields (Counting fingers)      Left Right    Full Full       Extraocular Movement      Right Left    Full, Ortho Full, Ortho       Neuro/Psych    Oriented x3:  Yes   Mood/Affect:  Normal       Dilation    Both eyes:  1.0% Mydriacyl, 2.5% Phenylephrine @ 10:13 AM        Slit Lamp and Fundus Exam    Slit Lamp Exam      Right Left   Lids/Lashes Dermatochalasis  Dermatochalasis    Conjunctiva/Sclera sub-conj heme extending inferiorly to nasal side, pinguecula  single corkscrew vessel  at 3:00   Cornea 1+ PEE, mild EBMD, mild arcus mild PEE, mild arcus   Anterior Chamber deep and clear deep and clear   Iris round and dilated round and dilated   Lens 2-3+NS, 2+ Cortical 1-2+NS, 1-2+ Cortical   Vitreous syneresis syneresis, PVD with weiss ring       Fundus Exam      Right Left   Disc compact, temp PPA compact, mild temp PPA   C/D Ratio 0.0 0.3 mild temp PPA   Macula flat, blunted foveal reflex, mild RPE mottling,no heme or edema flat, blunted foveal reflex, mild RPE mottling,no heme or edema   Vessels Attenuated, A/V crossing changes Mild attenuation, mild tortousity   Periphery attached, mild pigmented cystoid degeneration at 6:00 attached, mild pigmented cystoid degeneration at 6:00        Refraction    Wearing Rx      Sphere Cylinder Axis   Right -4.75 Sphere    Left -5.00 +1.25 180   Age:  28 years   Type:  SVL       Manifest Refraction      Sphere Cylinder Axis Dist VA   Right -4.75 Sphere  20/20   Left -5.00 +1.25 180 20/15-          IMAGING AND PROCEDURES  Imaging and Procedures for @TODAY @  OCT, Retina - OU - Both Eyes       Right Eye Quality was good. Central Foveal Thickness: 261. Progression has no prior data. Findings include normal foveal contour, no IRF, no SRF.   Left Eye Quality was good. Central  Foveal Thickness: 266. Progression has no prior data. Findings include normal foveal contour, no IRF, no SRF.   Notes *Images captured and stored on drive  Diagnosis / Impression:   OD: NFP, no IRF/SRF-normal study OS: NFP, no IRF/SRF-normal study   Clinical management:  See below  Abbreviations: NFP - Normal foveal profile. CME - cystoid macular edema. PED - pigment epithelial detachment. IRF - intraretinal fluid. SRF - subretinal fluid. EZ - ellipsoid zone. ERM - epiretinal membrane. ORA - outer retinal atrophy. ORT - outer retinal tubulation. SRHM - subretinal hyper-reflective material                  ASSESSMENT/PLAN:    ICD-10-CM   1. Posterior vitreous detachment of left eye H43.812   2. Retinal edema H35.81 OCT, Retina - OU - Both Eyes  3. Subconjunctival hemorrhage of right eye H11.31   4. Essential hypertension I10   5. Hypertensive retinopathy of both eyes H35.033   6. Combined forms of age-related cataract of both eyes H25.813     1,2. PVD / vitreous syneresis OS  Pt reports symptomatic floaters w/ intermittent flashes OS x2 days  Discussed findings and prognosis  No RT or RD on 360 scleral depressed exam  Reviewed s/s of RT/RD  Strict return precautions for any such RT/RD signs/symptoms  return 3-4 weeks  3. Sub conj hemorrhage OD  - pt on plavix and ASA for history of cardiac stents  - pt reports history of rubbing OD correlating to onset of subconj heme  - discussed findings and prognosis  - monitor -- will resolve without tx in 1-2 wks  4,5.Hypertensive retinopathy OU - discussed importance of tight BP control - monitor  6. Mixed age-related cataracts OU - The symptoms of cataract, surgical options, and treatments and risks were discussed with patient. - discussed diagnosis and progression - not yet visually significant - monitor for now    Ophthalmic -Meds Ordered this visit:  No orders of the defined types were placed in this encounter.      Return 3-4 weeks , for DFE, OCT.  There are no Patient Instructions on file for this visit.   Explained the diagnoses, plan, and follow up with the patient and they expressed understanding.  Patient expressed understanding of the importance of proper follow up care.   This document serves as a record of services personally performed by Karie ChimeraBrian G. Dayna Alia, MD, PhD. It was created on their behalf by Laurian BrimAmanda Brown, OA, an ophthalmic assistant. The creation of this record is the provider's dictation and/or activities during the visit.    Electronically signed by: Laurian BrimAmanda Brown, OA  06.08.2020 11:44 PM    Karie ChimeraBrian G.  Aser Nylund, M.D., Ph.D. Diseases & Surgery of the Retina and Vitreous [facility]   I have reviewed the above documentation for accuracy and completeness, and I agree with the above. Karie ChimeraBrian G. Vivyan Biggers, M.D., Ph.D. 01/02/19 11:44 PM    Abbreviations: M myopia (nearsighted); A astigmatism; H hyperopia (farsighted); P presbyopia; Mrx spectacle prescription;  CTL contact lenses; OD right eye; OS left eye; OU both eyes  XT exotropia; ET esotropia; PEK punctate epithelial keratitis; PEE punctate epithelial erosions; DES dry eye syndrome; MGD meibomian gland dysfunction; ATs artificial tears; PFAT's preservative free artificial tears; NSC nuclear sclerotic cataract; PSC posterior subcapsular cataract; ERM epi-retinal membrane; PVD posterior vitreous detachment; RD retinal detachment; DM diabetes mellitus; DR diabetic retinopathy; NPDR non-proliferative diabetic retinopathy; PDR proliferative diabetic retinopathy; CSME clinically significant  macular edema; DME diabetic macular edema; dbh dot blot hemorrhages; CWS cotton wool spot; POAG primary open angle glaucoma; C/D cup-to-disc ratio; HVF humphrey visual field; GVF goldmann visual field; OCT optical coherence tomography; IOP intraocular pressure; BRVO Branch retinal vein occlusion; CRVO central retinal vein occlusion; CRAO central retinal artery occlusion; BRAO branch retinal artery occlusion; RT retinal tear; SB scleral buckle; PPV pars plana vitrectomy; VH Vitreous hemorrhage; PRP panretinal laser photocoagulation; IVK intravitreal kenalog; VMT vitreomacular traction; MH Macular hole;  NVD neovascularization of the disc; NVE neovascularization elsewhere; AREDS age related eye disease study; ARMD age related macular degeneration; POAG primary open angle glaucoma; EBMD epithelial/anterior basement membrane dystrophy; ACIOL anterior chamber intraocular lens; IOL intraocular lens; PCIOL posterior chamber intraocular lens; Phaco/IOL  phacoemulsification with intraocular lens placement; PRK photorefractive keratectomy; LASIK laser assisted in situ keratomileusis; HTN hypertension; DM diabetes mellitus; COPD chronic obstructive pulmonary disease

## 2019-01-02 ENCOUNTER — Encounter (INDEPENDENT_AMBULATORY_CARE_PROVIDER_SITE_OTHER): Payer: Self-pay | Admitting: Ophthalmology

## 2019-01-02 ENCOUNTER — Other Ambulatory Visit: Payer: Self-pay

## 2019-01-02 ENCOUNTER — Ambulatory Visit (INDEPENDENT_AMBULATORY_CARE_PROVIDER_SITE_OTHER): Payer: Medicare Other | Admitting: Ophthalmology

## 2019-01-02 DIAGNOSIS — H3581 Retinal edema: Secondary | ICD-10-CM | POA: Diagnosis not present

## 2019-01-02 DIAGNOSIS — I10 Essential (primary) hypertension: Secondary | ICD-10-CM

## 2019-01-02 DIAGNOSIS — H35033 Hypertensive retinopathy, bilateral: Secondary | ICD-10-CM

## 2019-01-02 DIAGNOSIS — H43812 Vitreous degeneration, left eye: Secondary | ICD-10-CM

## 2019-01-02 DIAGNOSIS — H25813 Combined forms of age-related cataract, bilateral: Secondary | ICD-10-CM

## 2019-01-02 DIAGNOSIS — H1131 Conjunctival hemorrhage, right eye: Secondary | ICD-10-CM | POA: Diagnosis not present

## 2019-01-15 DIAGNOSIS — F3170 Bipolar disorder, currently in remission, most recent episode unspecified: Secondary | ICD-10-CM | POA: Diagnosis not present

## 2019-01-18 ENCOUNTER — Ambulatory Visit: Payer: Medicare Other

## 2019-01-22 NOTE — Progress Notes (Signed)
[facility] Note  01/23/2019     CHIEF COMPLAINT Patient presents for Retina Evaluation   HISTORY OF PRESENT ILLNESS: Lindsay [name] is a 63 y.o. female who presents to the clinic today for:   HPI    Retina Evaluation    In right eye.  This started weeks ago.  Duration of weeks.  Associated Symptoms Floaters.  I, the attending physician,  performed the HPI with the patient and updated documentation appropriately.          Comments    Patient states floaters are still present in her left eye.  Patient denies eye pain or discomfort.  Patient denies flashes of light.       Last edited by Bernarda Caffey, MD on 01/23/2019  5:15 PM. (History)    pt states there is only one floater in her left eye, she states it looks like an open, round circle, she states it is more prominent when she is reading or when she is looking at a white background, she states the flashes she was seeing have completely stopped   Referring physician: No referring provider defined for this encounter.  HISTORICAL INFORMATION:   Selected notes from the MEDICAL RECORD NUMBER Referred by Marda Stalker, PA-C for concern of hematoma OD LEE:  Ocular Hx- PMH-   CURRENT MEDICATIONS: No current outpatient medications on file. (Ophthalmic Drugs)   No current facility-administered medications for this visit.  (Ophthalmic Drugs)   Current Outpatient Medications (Other)  Medication Sig  . amLODipine (NORVASC) 2.5 MG tablet Take 1 tablet (2.5 mg total) by mouth daily.  [name] aspirin EC 81 MG tablet Take 81 mg by mouth at bedtime.   [name] b complex vitamins tablet Take 1 tablet by mouth daily.  [name] BIOTIN PO Take 1 tablet by mouth daily.  . calcium carbonate (TUMS - DOSED IN MG ELEMENTAL CALCIUM) 500 MG chewable tablet Chew 2 tablets by mouth 3 (three) times daily as needed for indigestion or heartburn.   . clopidogrel (PLAVIX) 75 MG tablet Take 1 tablet (75 mg total) by mouth daily with breakfast.  .  Coenzyme Q10 (COQ-10 PO) Take 1 tablet by mouth daily.  . Cyanocobalamin (B-12 PO) Take 1 tablet by mouth daily.  . diclofenac sodium (VOLTAREN) 1 % GEL diclofenac 1 % topical gel  APPLY TWO GRAMS TOPICALLY TO AFFECTED AREA TWO TIMES A DAY  . DULoxetine (CYMBALTA) 30 MG capsule Take 30 mg by mouth daily.  [name] esomeprazole (NEXIUM) 20 MG capsule Take 20 mg by mouth daily.   . hydrOXYzine (VISTARIL) 50 MG capsule Take 50 mg by mouth at bedtime as needed for anxiety (sleep).   [name] ibuprofen (ADVIL,MOTRIN) 200 MG tablet Take 200 mg by mouth 2 (two) times daily as needed for moderate pain.  [name] losartan (COZAAR) 100 MG tablet Take 100 mg by mouth daily.  . metoprolol tartrate (LOPRESSOR) 25 MG tablet TAKE 1/2 TAB (12.5 MG TOTAL) BY MOUTH 2 (TWO) TIMES DAILY. (Patient taking differently: Take 12.5 mg by mouth 2 (two) times daily. )  . nitroGLYCERIN (NITROSTAT) 0.4 MG SL tablet Place 1 tablet (0.4 mg total) under the tongue as needed for chest pain.  . Omega-3 Fatty Acids (OMEGA 3 PO) Take 1 capsule by mouth daily.  [name] oxymetazoline (AFRIN) 0.05 % nasal spray Place 1 spray into both nostrils daily as needed for congestion.  . predniSONE (DELTASONE) 10 MG tablet prednisone 10 mg tablet  Take 6 tablets po x1 day,  5 tablets po x 1 day, 4 tablets po x 1 day, 3 tablets po x 1 day, 2 tablets po x 1 day, 1 tablet po x 1 day  . VITAMIN D PO Take 2 capsules by mouth daily.   No current facility-administered medications for this visit.  (Other)      REVIEW OF SYSTEMS: ROS    Positive for: Gastrointestinal, Musculoskeletal, Cardiovascular, Eyes, Psychiatric   Negative for: Constitutional, Neurological, Skin, Genitourinary, HENT, Endocrine, Respiratory, Allergic/Imm, Heme/Lymph   Last edited by Corrinne EagleEnglish, Ashley L on 01/23/2019  1:35 PM. (History)       ALLERGIES Allergies  Allergen Reactions  . Bee Venom Anaphylaxis  . Sulfa Antibiotics Rash and Swelling  . Ciprofloxacin Nausea And Vomiting  . Depakote  [Divalproex Sodium] Other (See Comments)    Unknown  . Risperidone Other (See Comments)    Unknown  . Seroquel [Quetiapine Fumarate] Other (See Comments)    Nightmares  . Statins Other (See Comments)    joint pain, weakness  . Tramadol Other (See Comments)    Serotonin syndrome  . Citalopram Rash    insomnia, agitation  . Lamotrigine Itching and Rash    Other reaction(s): brown urine  . Topiramate Rash    Nightmares, brown urine   . Zetia [Ezetimibe]     Joint pain, weakness     PAST MEDICAL HISTORY Past Medical History:  Diagnosis Date  . Anginal pain (HCC)   . Atrial fibrillation (HCC)   . Bipolar disorder (HCC)   . Coronary artery disease involving native coronary artery with angina pectoris (HCC) 03/24/2015  . Dysrhythmia   . GERD (gastroesophageal reflux disease)   . History of appendectomy 1963  . Hypertension   . Myocardial infarction (HCC)    per pt due to Vit D deficiency  . Vitamin D deficiency    Past Surgical History:  Procedure Laterality Date  . APPENDECTOMY    . CARDIAC CATHETERIZATION N/A 03/25/2015   Procedure: Left Heart Cath and Coronary Angiography;  Surgeon: Marykay Lexavid W Harding, MD;  [facility];  Service: Cardiovascular;  Laterality: N/A;  . CORONARY STENT INTERVENTION N/A 10/09/2018   Procedure: CORONARY STENT INTERVENTION;  Surgeon: Corky CraftsVaranasi, Jayadeep S, MD;  Location: [facility] INVASIVE CV LAB;  Service: Cardiovascular;  Laterality: N/A;  . DILATATION & CURETTAGE/HYSTEROSCOPY WITH MYOSURE N/A 05/19/2016   Procedure: DILATATION & CURETTAGE/HYSTEROSCOPY WITH MYOSURE;  Surgeon: Geryl RankinsEvelyn Varnado, MD;  Location: [facility];  Service: Gynecology;  Laterality: N/A;  Possible Myosure for polyps.  [name]. EYE SURGERY    . LEFT HEART CATH AND CORONARY ANGIOGRAPHY N/A 10/09/2018   Procedure: LEFT HEART CATH AND CORONARY ANGIOGRAPHY;  Surgeon: Corky CraftsVaranasi, Jayadeep S, MD;  [facility];  Service: Cardiovascular;  Laterality: N/A;  . MOUTH SURGERY    .  URETHRAL DILATION      FAMILY HISTORY Family History  Problem Relation Age of Onset  . Vascular Disease Mother   . Dementia Mother   . Heart attack Maternal Grandmother   . Heart attack Maternal Grandfather   . Kidney failure Paternal Grandmother   . Heart attack Paternal Grandfather     SOCIAL HISTORY Social History   Tobacco Use  . Smoking status: Never Smoker  . Smokeless tobacco: Never Used  Substance Use Topics  . Alcohol use: Yes    Alcohol/week: 2.0 standard drinks    Types: 2 Standard drinks or equivalent per week    Comment: rarely  . Drug use: No  OPHTHALMIC EXAM:  Base Eye Exam    Visual Acuity (Snellen - Linear)      Right Left   Dist cc 20/20 -1 20/20 -1   Correction: Glasses       Tonometry (Tonopen, 1:37 PM)      Right Left   Pressure 22 23       Pupils      Dark Light Shape React APD   Right 4 3 Round Brisk 0   Left 4 3 Round Brisk 0       Extraocular Movement      Right Left    Full Full       Neuro/Psych    Oriented x3: Yes   Mood/Affect: Normal       Dilation    Both eyes: 1.0% Mydriacyl, 2.5% Phenylephrine @ 1:37 PM        Slit Lamp and Fundus Exam    Slit Lamp Exam      Right Left   Lids/Lashes Dermatochalasis  Dermatochalasis    Conjunctiva/Sclera sub-conj heme extending inferiorly to nasal side - resolved, pinguecula  single corkscrew vessel at 3:00   Cornea 1+ PEE, mild EBMD, mild arcus mild PEE, mild arcus   Anterior Chamber deep and clear deep and clear   Iris round and dilated round and dilated   Lens 2-3+NS, 2+ Cortical 1-2+NS, 1-2+ Cortical   Vitreous syneresis syneresis, PVD with weiss ring, vitreous condensations centrally       Fundus Exam      Right Left   Disc compact, mild tilt, temp PPA compact, mild temp PPA   C/D Ratio 0.1 0.3    Macula flat, blunted foveal reflex, mild RPE mottling, no heme or edema flat, blunted foveal reflex, mild RPE mottling, no heme or edema   Vessels Attenuated, AV  crossing changes Mild attenuation, mild tortousity   Periphery attached, mild pigmented cystoid degeneration at 6:00 attached, mild pigmented cystoid degeneration at 6:00        Refraction    Wearing Rx      Sphere Cylinder Axis   Right -4.75 Sphere    Left -5.00 +1.25 180   Type: SVL          IMAGING AND PROCEDURES  Imaging and Procedures for @TODAY @  OCT, Retina - OU - Both Eyes       Right Eye Quality was good. Central Foveal Thickness: 264. Progression has been stable. Findings include normal foveal contour, no IRF, no SRF.   Left Eye Quality was good. Central Foveal Thickness: 267. Progression has been stable. Findings include normal foveal contour, no IRF, no SRF.   Notes *Images captured and stored on drive  Diagnosis / Impression:   OD: NFP, no IRF/SRF-normal study OS: NFP, no IRF/SRF-normal study   Clinical management:  See below  Abbreviations: NFP - Normal foveal profile. CME - cystoid macular edema. PED - pigment epithelial detachment. IRF - intraretinal fluid. SRF - subretinal fluid. EZ - ellipsoid zone. ERM - epiretinal membrane. ORA - outer retinal atrophy. ORT - outer retinal tubulation. SRHM - subretinal hyper-reflective material                 ASSESSMENT/PLAN:    ICD-10-CM   1. Posterior vitreous detachment of left eye  H43.812   2. Retinal edema  H35.81 OCT, Retina - OU - Both Eyes  3. Subconjunctival hemorrhage of right eye  H11.31   4. Essential hypertension  I10   5. Hypertensive retinopathy of both  eyes  H35.033   6. Combined forms of age-related cataract of both eyes  H25.813     1,2. PVD / vitreous syneresis OS  - Pt reports symptomatic floaters w/ intermittent flashes OS x2 days  - Discussed findings and prognosis  - No RT or RD on 360 scleral depressed exam  - Reviewed s/s of RT/RD  - Strict return precautions for any such RT/RD signs/symptoms  - f/u 3-4 months, sooner prn  3. Sub conj hemorrhage OD -- resolved  - pt  on plavix and ASA for history of cardiac stents  - pt reports history of rubbing OD correlating to onset of subconj heme  - discussed findings and prognosis  - monitor -- resolved today  4,5.Hypertensive retinopathy OU  - discussed importance of tight BP control  - monitor  6. Mixed age-related cataracts OU  - The symptoms of cataract, surgical options, and treatments and risks were discussed with patient.  - discussed diagnosis and progression  - not yet visually significant  - monitor for now  7. Ocular hypertension - mild elevation, first recording outside normal limits - IOP 22 OD and 23 OS today - monitor for now    Ophthalmic -Meds Ordered this visit:  No orders of the defined types were placed in this encounter.      Return for f/u 3-4 months, PVD OS, DFE, OCT.  There are no Patient Instructions on file for this visit.   Explained the diagnoses, plan, and follow up with the patient and they expressed understanding.  Patient expressed understanding of the importance of proper follow up care.   This document serves as a record of services personally performed by Karie ChimeraBrian G. Tyner Codner, MD, PhD. It was created on their behalf by Laurian BrimAmanda Brown, OA, an ophthalmic assistant. The creation of this record is the provider's dictation and/or activities during the visit.    Electronically signed by: Laurian BrimAmanda Brown, OA  06.29.2020 8:16 AM     Karie ChimeraBrian G. Alizaya Oshea, M.D., Ph.D. Diseases & Surgery of the Retina and Vitreous [facility]  I have reviewed the above documentation for accuracy and completeness, and I agree with the above. Karie ChimeraBrian G. Fadumo Heng, M.D., Ph.D. 01/25/19 8:16 AM    Abbreviations: M myopia (nearsighted); A astigmatism; H hyperopia (farsighted); P presbyopia; Mrx spectacle prescription;  CTL contact lenses; OD right eye; OS left eye; OU both eyes  XT exotropia; ET esotropia; PEK punctate epithelial keratitis; PEE punctate epithelial erosions; DES dry  eye syndrome; MGD meibomian gland dysfunction; ATs artificial tears; PFAT's preservative free artificial tears; NSC nuclear sclerotic cataract; PSC posterior subcapsular cataract; ERM epi-retinal membrane; PVD posterior vitreous detachment; RD retinal detachment; DM diabetes mellitus; DR diabetic retinopathy; NPDR non-proliferative diabetic retinopathy; PDR proliferative diabetic retinopathy; CSME clinically significant macular edema; DME diabetic macular edema; dbh dot blot hemorrhages; CWS cotton wool spot; POAG primary open angle glaucoma; C/D cup-to-disc ratio; HVF humphrey visual field; GVF goldmann visual field; OCT optical coherence tomography; IOP intraocular pressure; BRVO Branch retinal vein occlusion; CRVO central retinal vein occlusion; CRAO central retinal artery occlusion; BRAO branch retinal artery occlusion; RT retinal tear; SB scleral buckle; PPV pars plana vitrectomy; VH Vitreous hemorrhage; PRP panretinal laser photocoagulation; IVK intravitreal kenalog; VMT vitreomacular traction; MH Macular hole;  NVD neovascularization of the disc; NVE neovascularization elsewhere; AREDS age related eye disease study; ARMD age related macular degeneration; POAG primary open angle glaucoma; EBMD epithelial/anterior basement membrane dystrophy; ACIOL anterior chamber intraocular lens; IOL intraocular lens; PCIOL posterior  chamber intraocular lens; Phaco/IOL phacoemulsification with intraocular lens placement; [place] photorefractive keratectomy; LASIK laser assisted in situ keratomileusis; HTN hypertension; DM diabetes mellitus; COPD chronic obstructive pulmonary disease

## 2019-01-23 ENCOUNTER — Other Ambulatory Visit: Payer: Self-pay

## 2019-01-23 ENCOUNTER — Ambulatory Visit (INDEPENDENT_AMBULATORY_CARE_PROVIDER_SITE_OTHER): Payer: Medicare Other | Admitting: Ophthalmology

## 2019-01-23 DIAGNOSIS — H35033 Hypertensive retinopathy, bilateral: Secondary | ICD-10-CM

## 2019-01-23 DIAGNOSIS — H3581 Retinal edema: Secondary | ICD-10-CM

## 2019-01-23 DIAGNOSIS — H25813 Combined forms of age-related cataract, bilateral: Secondary | ICD-10-CM | POA: Diagnosis not present

## 2019-01-23 DIAGNOSIS — I10 Essential (primary) hypertension: Secondary | ICD-10-CM

## 2019-01-23 DIAGNOSIS — H43812 Vitreous degeneration, left eye: Secondary | ICD-10-CM | POA: Diagnosis not present

## 2019-01-23 DIAGNOSIS — H1131 Conjunctival hemorrhage, right eye: Secondary | ICD-10-CM

## 2019-01-25 ENCOUNTER — Other Ambulatory Visit (HOSPITAL_COMMUNITY): Payer: Medicare Other

## 2019-01-25 ENCOUNTER — Encounter (INDEPENDENT_AMBULATORY_CARE_PROVIDER_SITE_OTHER): Payer: Self-pay | Admitting: Ophthalmology

## 2019-01-26 ENCOUNTER — Inpatient Hospital Stay (HOSPITAL_COMMUNITY): Admission: RE | Admit: 2019-01-26 | Payer: Medicare Other | Source: Ambulatory Visit

## 2019-01-30 ENCOUNTER — Encounter (HOSPITAL_BASED_OUTPATIENT_CLINIC_OR_DEPARTMENT_OTHER): Payer: Medicare Other

## 2019-02-15 DIAGNOSIS — W19XXXA Unspecified fall, initial encounter: Secondary | ICD-10-CM | POA: Diagnosis not present

## 2019-02-15 DIAGNOSIS — R52 Pain, unspecified: Secondary | ICD-10-CM | POA: Diagnosis not present

## 2019-02-15 DIAGNOSIS — R609 Edema, unspecified: Secondary | ICD-10-CM | POA: Diagnosis not present

## 2019-02-15 DIAGNOSIS — R58 Hemorrhage, not elsewhere classified: Secondary | ICD-10-CM | POA: Diagnosis not present

## 2019-02-16 ENCOUNTER — Other Ambulatory Visit (HOSPITAL_COMMUNITY): Payer: Medicare Other

## 2019-02-16 DIAGNOSIS — S0003XA Contusion of scalp, initial encounter: Secondary | ICD-10-CM | POA: Diagnosis not present

## 2019-02-17 ENCOUNTER — Other Ambulatory Visit: Payer: Self-pay | Admitting: Physician Assistant

## 2019-02-20 ENCOUNTER — Encounter (HOSPITAL_BASED_OUTPATIENT_CLINIC_OR_DEPARTMENT_OTHER): Payer: Medicare Other

## 2019-02-27 ENCOUNTER — Ambulatory Visit
Admission: RE | Admit: 2019-02-27 | Discharge: 2019-02-27 | Disposition: A | Discharge status: Home / Self Care | Payer: Medicare Other | Source: Ambulatory Visit | Attending: Obstetrics and Gynecology | Admitting: Obstetrics and Gynecology

## 2019-02-27 ENCOUNTER — Other Ambulatory Visit: Payer: Self-pay

## 2019-02-27 DIAGNOSIS — Z1231 Encounter for screening mammogram for malignant neoplasm of breast: Secondary | ICD-10-CM | POA: Diagnosis not present

## 2019-03-05 DIAGNOSIS — Z4802 Encounter for removal of sutures: Secondary | ICD-10-CM | POA: Diagnosis not present

## 2019-03-05 DIAGNOSIS — S0101XD Laceration without foreign body of scalp, subsequent encounter: Secondary | ICD-10-CM | POA: Diagnosis not present

## 2019-03-07 DIAGNOSIS — M542 Cervicalgia: Secondary | ICD-10-CM | POA: Diagnosis not present

## 2019-03-07 DIAGNOSIS — M5030 Other cervical disc degeneration, unspecified cervical region: Secondary | ICD-10-CM | POA: Diagnosis not present

## 2019-03-15 DIAGNOSIS — M5030 Other cervical disc degeneration, unspecified cervical region: Secondary | ICD-10-CM | POA: Diagnosis not present

## 2019-03-16 DIAGNOSIS — Z23 Encounter for immunization: Secondary | ICD-10-CM | POA: Diagnosis not present

## 2019-03-20 ENCOUNTER — Other Ambulatory Visit (HOSPITAL_COMMUNITY)
Admission: RE | Admit: 2019-03-20 | Discharge: 2019-03-20 | Disposition: A | Discharge status: Home / Self Care | Payer: Medicare Other | Source: Ambulatory Visit | Attending: Cardiovascular Disease | Admitting: Cardiovascular Disease

## 2019-03-20 DIAGNOSIS — Z01812 Encounter for preprocedural laboratory examination: Secondary | ICD-10-CM | POA: Insufficient documentation

## 2019-03-20 DIAGNOSIS — Z20828 Contact with and (suspected) exposure to other viral communicable diseases: Secondary | ICD-10-CM | POA: Diagnosis not present

## 2019-03-20 LAB — SARS CORONAVIRUS 2 (TAT 6-24 HRS): SARS Coronavirus 2: NEGATIVE

## 2019-03-22 ENCOUNTER — Other Ambulatory Visit: Payer: Self-pay

## 2019-03-22 ENCOUNTER — Ambulatory Visit (HOSPITAL_BASED_OUTPATIENT_CLINIC_OR_DEPARTMENT_OTHER): Payer: Medicare Other | Attending: Cardiovascular Disease | Admitting: Cardiovascular Disease

## 2019-03-22 DIAGNOSIS — R0681 Apnea, not elsewhere classified: Secondary | ICD-10-CM | POA: Diagnosis not present

## 2019-03-22 DIAGNOSIS — R400 Somnolence: Secondary | ICD-10-CM | POA: Diagnosis not present

## 2019-03-22 DIAGNOSIS — G4733 Obstructive sleep apnea (adult) (pediatric): Secondary | ICD-10-CM | POA: Diagnosis not present

## 2019-03-22 DIAGNOSIS — R002 Palpitations: Secondary | ICD-10-CM | POA: Diagnosis not present

## 2019-04-01 NOTE — Procedures (Signed)
Patient Name: Lindsay [name], Lindsay [name]: 03/22/2021 Gender: Female D.O.B: [birth year] Age (years): [age] Referring Provider: Chilton Siiffany [place] Height (inches): 62 Interpreting Physician: Nicki Guadalajarahomas Turki Tapanes MD, ABSM Weight (lbs): 180 RPSGT: Cherylann ParrDubili, Fred BMI: 33 MRN: [id number] Neck Size: 16.00  CLINICAL INFORMATION Sleep Study Type: NPSG  Indication for sleep study: Fatigue, Obesity, Snoring, Witnesses Apnea / Gasping During Sleep  Epworth Sleepiness Score: 8  SLEEP STUDY TECHNIQUE As per the AASM Manual for the Scoring of Sleep and Associated Events v2.3 (April 2016) with a hypopnea requiring 4% desaturations.  The channels recorded and monitored were frontal, central and occipital EEG, electrooculogram (EOG), submentalis EMG (chin), nasal and oral airflow, thoracic and abdominal wall motion, anterior tibialis EMG, snore microphone, electrocardiogram, and pulse oximetry.  MEDICATIONS   amLODipine (NORVASC) 2.5 MG tablet    aspirin EC 81 MG tablet    b complex vitamins tablet    BIOTIN PO    calcium carbonate (TUMS - DOSED IN MG ELEMENTAL CALCIUM) 500 MG chewable tablet    clopidogrel (PLAVIX) 75 MG tablet    Coenzyme Q10 (COQ-10 PO)    Cyanocobalamin (B-12 PO)    diclofenac sodium (VOLTAREN) 1 % GEL    DULoxetine (CYMBALTA) 30 MG capsule    esomeprazole (NEXIUM) 20 MG capsule    hydrOXYzine (VISTARIL) 50 MG capsule    ibuprofen (ADVIL,MOTRIN) 200 MG tablet    losartan (COZAAR) 100 MG tablet    metoprolol tartrate (LOPRESSOR) 25 MG tablet    nitroGLYCERIN (NITROSTAT) 0.4 MG SL tablet    Omega-3 Fatty Acids (OMEGA 3 PO)    oxymetazoline (AFRIN) 0.05 % nasal spray    predniSONE (DELTASONE) 10 MG tablet    VITAMIN D PO    Medications self-administered by patient taken the night of the study : AMLODIPINE, METOPROLOL  SLEEP ARCHITECTURE The study was initiated at 10:49:08 PM and ended at 5:33:20 AM.  Sleep onset time was 26.9 minutes and the sleep efficiency was 84.4%%. The  total sleep time was 341 minutes.  Stage REM latency was 346.0 minutes.  The patient spent 5.7%% of the night in stage N1 sleep, 85.5%% in stage N2 sleep, 0.0%% in stage N3 and 8.8% in REM.  Alpha intrusion was absent.  Supine sleep was 64.30%.  RESPIRATORY PARAMETERS The overall apnea/hypopnea index (AHI) was 30.3 per hour. The respiratory disturbance index (RDI) was 30.3/h. There were 63 total apneas, including 60 obstructive, 3 central and 0 mixed apneas. There were 109 hypopneas and 0 RERAs.  The AHI during Stage REM sleep was 90.0 per hour.  AHI while supine was 34.8 per hour.  The mean oxygen saturation was 89.8%. The minimum SpO2 during sleep was 74.0%.  Loud snoring was noted during this study.  CARDIAC DATA The 2 lead EKG demonstrated sinus rhythm. The mean heart rate was 68.5 beats per minute. Other EKG findings include: Atrial Fibrillation.  LEG MOVEMENT DATA The total PLMS were 0 with a resulting PLMS index of 0.0. Associated arousal with leg movement index was 0.0 .  IMPRESSIONS - Severe obstructive sleep apnea (AHI 30.3/h); however, events were worse with supine sleep (AHI 34.8/h) and very severe during REM sleep (AHI 90/h). - No significant central sleep apnea occurred during this study (CAI = 0.5/h). - Significant oxygen desaturation to a nadir of 74.0%. - Abnormal sleep architecture with absence of slow wave sleep and prolonged latency to REM sleep. - The patient snored with loud snoring volume. - EKG findings include Atrial Fibrillation. - Clinically significant  periodic limb movements did not occur during sleep. No significant associated arousals.  DIAGNOSIS - Obstructive Sleep Apnea (327.23 [G47.33 ICD-10]) - Nocturnal Hypoxemia (327.26 [G47.36 ICD-10])  RECOMMENDATIONS - Therapeutic CPAP titration to determine optimal pressure required to alleviate sleep disordered breathing. - Effort should be made to optimize nasal and oropharyngeal patency. -  Positional therapy avoiding supine position during sleep. - Avoid alcohol, sedatives and other CNS depressants that may worsen sleep apnea and disrupt normal sleep architecture. - Sleep hygiene should be reviewed to assess factors that may improve sleep quality. - Weight management (BMI 33) and regular exercise should be initiated or continued if appropriate.  [Electronically signed] 04/01/2019 12:43 PM  Shelva Majestic MD, [facility], ABSM Diplomate, American Board of Sleep Medicine   NPI: [id number] [place] PH: [phone]   FX: [phone] [place]

## 2019-04-04 ENCOUNTER — Other Ambulatory Visit: Payer: Self-pay | Admitting: Cardiovascular Disease

## 2019-04-04 DIAGNOSIS — G4736 Sleep related hypoventilation in conditions classified elsewhere: Secondary | ICD-10-CM

## 2019-04-04 DIAGNOSIS — G4733 Obstructive sleep apnea (adult) (pediatric): Secondary | ICD-10-CM

## 2019-04-04 DIAGNOSIS — IMO0002 Reserved for concepts with insufficient information to code with codable children: Secondary | ICD-10-CM

## 2019-04-16 DIAGNOSIS — F3170 Bipolar disorder, currently in remission, most recent episode unspecified: Secondary | ICD-10-CM | POA: Diagnosis not present

## 2019-04-17 ENCOUNTER — Telehealth: Payer: Self-pay | Admitting: *Deleted

## 2019-04-17 ENCOUNTER — Other Ambulatory Visit: Payer: Self-pay | Admitting: Cardiovascular Disease

## 2019-04-17 DIAGNOSIS — G4736 Sleep related hypoventilation in conditions classified elsewhere: Secondary | ICD-10-CM

## 2019-04-17 DIAGNOSIS — IMO0002 Reserved for concepts with insufficient information to code with codable children: Secondary | ICD-10-CM

## 2019-04-17 DIAGNOSIS — G4733 Obstructive sleep apnea (adult) (pediatric): Secondary | ICD-10-CM

## 2019-04-17 NOTE — Telephone Encounter (Signed)
Left message that I called to discuss sleep study results and recommendations. I will call her again later.

## 2019-04-23 NOTE — Telephone Encounter (Signed)
Left message I need to discuss sleep study results and recommendations again on last Friday. Called work and patient was not working. Unable to reach patient again this morning. Results and recommendation left on cell VM due to appointment dates rapidly approaching. Left sleep lab contact information to reschedule if needed.

## 2019-04-25 ENCOUNTER — Encounter (INDEPENDENT_AMBULATORY_CARE_PROVIDER_SITE_OTHER): Payer: Medicare Other | Admitting: Ophthalmology

## 2019-04-25 ENCOUNTER — Other Ambulatory Visit (HOSPITAL_COMMUNITY): Payer: Medicare Other

## 2019-04-25 NOTE — Progress Notes (Signed)
[facility] - Clinic Note  05/01/2019     CHIEF COMPLAINT Patient presents for Retina Follow Up   HISTORY OF PRESENT ILLNESS: Lindsay [name] is a 63 y.o. female who presents to the clinic today for:   HPI    Retina Follow Up    Patient presents with  PVD.  In left eye.  This started 4 months ago.  Severity is mild.  Duration of 3 months.  Since onset it is stable.  I, the attending physician,  performed the HPI with the patient and updated documentation appropriately.          Comments    63 y/o female pt here for 3 mo f/u for PVD OS.  No change in [place] OU.  Still has a small floater OS.  Denies pain, flashes.  No issues reported OD.  Eyes often feel dry, but pt is doing nothing to treat.  No gtts.       Last edited by Rennis Chris, MD on 05/01/2019  9:11 AM. (History)    pt states she just took a nitro glycerin pill bc she is having chest pain, she states the pill has helped her symptoms and she is starting to feel better, she states she will call her dr when she leaves here, pt states she still has a floater in her vision, but it seems to have faded out, pt has gotten new reading glasses since last exam, pt states a few weeks ago she noticed a orange spot in her center vision, but states she had not taken her blood pressure medication, she called her PCP and they told her that is a common symptom when blood pressure is elevated  Referring physician: Jarrett Soho, PA-C [address],  [place] [address]  HISTORICAL INFORMATION:   Selected notes from the MEDICAL RECORD NUMBER Referred by Jarrett Soho, PA-C for concern of hematoma OD LEE:  Ocular Hx- PMH-   CURRENT MEDICATIONS: No current outpatient medications on file. (Ophthalmic Drugs)   No current facility-administered medications for this visit.  (Ophthalmic Drugs)   Current Outpatient Medications (Other)  Medication Sig  . amLODipine (NORVASC) 2.5 MG tablet TAKE ONE TABLET BY MOUTH ONE  TIME DAILY  . aspirin EC 81 MG tablet Take 81 mg by mouth at bedtime.   [name] b complex vitamins tablet Take 1 tablet by mouth daily.  [name] BIOTIN PO Take 1 tablet by mouth daily.  . calcium carbonate (TUMS - DOSED IN MG ELEMENTAL CALCIUM) 500 MG chewable tablet Chew 2 tablets by mouth 3 (three) times daily as needed for indigestion or heartburn.   . clopidogrel (PLAVIX) 75 MG tablet Take 1 tablet (75 mg total) by mouth daily with breakfast.  . Coenzyme Q10 (COQ-10 PO) Take 1 tablet by mouth daily.  . Cyanocobalamin (B-12 PO) Take 1 tablet by mouth daily.  . diclofenac sodium (VOLTAREN) 1 % GEL diclofenac 1 % topical gel  APPLY TWO GRAMS TOPICALLY TO AFFECTED AREA TWO TIMES A DAY  . DULoxetine (CYMBALTA) 30 MG capsule Take 30 mg by mouth daily.  [name] esomeprazole (NEXIUM) 20 MG capsule Take 20 mg by mouth daily.   . hydrOXYzine (VISTARIL) 50 MG capsule Take 50 mg by mouth at bedtime as needed for anxiety (sleep).   [name] ibuprofen (ADVIL,MOTRIN) 200 MG tablet Take 200 mg by mouth 2 (two) times daily as needed for moderate pain.  [name] losartan (COZAAR) 100 MG tablet Take 100 mg by mouth daily.  [name]  metoprolol tartrate (LOPRESSOR) 25 MG tablet TAKE 1/2 TAB (12.5 MG TOTAL) BY MOUTH 2 (TWO) TIMES DAILY. (Patient taking differently: Take 12.5 mg by mouth 2 (two) times daily. )  . nitroGLYCERIN (NITROSTAT) 0.4 MG SL tablet Place 1 tablet (0.4 mg total) under the tongue as needed for chest pain.  . Omega-3 Fatty Acids (OMEGA 3 PO) Take 1 capsule by mouth daily.  [name]. oxymetazoline (AFRIN) 0.05 % nasal spray Place 1 spray into both nostrils daily as needed for congestion.  . predniSONE (DELTASONE) 10 MG tablet prednisone 10 mg tablet  Take 6 tablets po x1 day, 5 tablets po x 1 day, 4 tablets po x 1 day, 3 tablets po x 1 day, 2 tablets po x 1 day, 1 tablet po x 1 day  . VITAMIN D PO Take 2 capsules by mouth daily.   No current facility-administered medications for this visit.  (Other)      REVIEW OF SYSTEMS: ROS     Positive for: Gastrointestinal, Musculoskeletal, Cardiovascular, Eyes   Negative for: Constitutional, Neurological, Skin, Genitourinary, HENT, Endocrine, Respiratory, Psychiatric, Allergic/Imm, Heme/Lymph   Last edited by Celine MansBaxley, Andrew G, COA on 05/01/2019  8:30 AM. (History)       ALLERGIES Allergies  Allergen Reactions  . Bee Venom Anaphylaxis  . Sulfa Antibiotics Rash and Swelling  . Ciprofloxacin Nausea And Vomiting  . Depakote [Divalproex Sodium] Other (See Comments)    Unknown  . Risperidone Other (See Comments)    Unknown  . Seroquel [Quetiapine Fumarate] Other (See Comments)    Nightmares  . Statins Other (See Comments)    joint pain, weakness  . Tramadol Other (See Comments)    Serotonin syndrome  . Citalopram Rash    insomnia, agitation  . Lamotrigine Itching and Rash    Other reaction(s): brown urine  . Topiramate Rash    Nightmares, brown urine   . Zetia [Ezetimibe]     Joint pain, weakness     PAST MEDICAL HISTORY Past Medical History:  Diagnosis Date  . Anginal pain (HCC)   . Atrial fibrillation (HCC)   . Bipolar disorder (HCC)   . Cataract    OU  . Coronary artery disease involving native coronary artery with angina pectoris (HCC) 03/24/2015  . Dysrhythmia   . GERD (gastroesophageal reflux disease)   . History of appendectomy 1963  . Hypertension   . Hypertensive retinopathy    OU  . Myocardial infarction (HCC)    per pt due to Vit D deficiency  . Vitamin D deficiency    Past Surgical History:  Procedure Laterality Date  . APPENDECTOMY    . CARDIAC CATHETERIZATION N/A 03/25/2015   Procedure: Left Heart Cath and Coronary Angiography;  Surgeon: Marykay Lexavid W Harding, MD;  [facility];  Service: Cardiovascular;  Laterality: N/A;  . CORONARY STENT INTERVENTION N/A 10/09/2018   Procedure: CORONARY STENT INTERVENTION;  Surgeon: Corky CraftsVaranasi, Jayadeep S, MD;  Location: [facility] INVASIVE CV LAB;  Service: Cardiovascular;  Laterality: N/A;  . DILATATION  & CURETTAGE/HYSTEROSCOPY WITH MYOSURE N/A 05/19/2016   Procedure: DILATATION & CURETTAGE/HYSTEROSCOPY WITH MYOSURE;  Surgeon: Geryl RankinsEvelyn Varnado, MD;  Location: [facility] ORS;  Service: Gynecology;  Laterality: N/A;  Possible Myosure for polyps.  [name]. EYE SURGERY    . LEFT HEART CATH AND CORONARY ANGIOGRAPHY N/A 10/09/2018   Procedure: LEFT HEART CATH AND CORONARY ANGIOGRAPHY;  Surgeon: Corky CraftsVaranasi, Jayadeep S, MD;  [facility];  Service: Cardiovascular;  Laterality: N/A;  . MOUTH SURGERY    .  URETHRAL DILATION      FAMILY HISTORY Family History  Problem Relation Age of Onset  . Vascular Disease Mother   . Dementia Mother   . Heart attack Maternal Grandmother   . Heart attack Maternal Grandfather   . Kidney failure Paternal Grandmother   . Heart attack Paternal Grandfather     SOCIAL HISTORY Social History   Tobacco Use  . Smoking status: Never Smoker  . Smokeless tobacco: Never Used  Substance Use Topics  . Alcohol use: Yes    Alcohol/week: 2.0 standard drinks    Types: 2 Standard drinks or equivalent per week    Comment: rarely  . Drug use: No         OPHTHALMIC EXAM:  Base Eye Exam    Visual Acuity (Snellen - Linear)      Right Left   Dist cc 20/25 20/20   Dist ph cc 20/20    Correction: Glasses       Tonometry (Tonopen, 8:33 AM)      Right Left   Pressure 17 19       Pupils      Dark Light Shape React APD   Right 4 3 Round Brisk None   Left 4 3 Round Brisk None       Visual Fields (Counting fingers)      Left Right    Full Full       Extraocular Movement      Right Left    Full, Ortho Full, Ortho       Neuro/Psych    Oriented x3: Yes   Mood/Affect: Normal       Dilation    Both eyes: 1.0% Mydriacyl, 2.5% Phenylephrine @ 8:33 AM        Slit Lamp and Fundus Exam    Slit Lamp Exam      Right Left   Lids/Lashes Dermatochalasis  Dermatochalasis    Conjunctiva/Sclera sub-conj heme extending inferiorly to nasal side - resolved, pinguecula   single corkscrew vessel at 3:00   Cornea 1+ PEE, mild EBMD, mild arcus mild PEE, mild arcus   Anterior Chamber deep and clear deep and clear   Iris round and dilated round and dilated   Lens 2-3+NS, 2+ Cortical 1-2+NS, 2+ Cortical   Vitreous syneresis syneresis, PVD with weiss ring, vitreous condensations centrally improved       Fundus Exam      Right Left   Disc compact, mild tilt, mild temp PPA compact, mild temp PPA, Tilted disc   C/D Ratio 0.1 0.3    Macula flat, blunted foveal reflex, mild RPE mottling, no heme or edema flat, blunted foveal reflex, mild RPE mottling, no heme or edema   Vessels Attenuated, Tortuous Mild attenuation, mild tortousity   Periphery attached, mild pigmented cystoid degeneration at 6:00 attached, mild pigmented cystoid degeneration at 6:00; no RT/RD 360          IMAGING AND PROCEDURES  Imaging and Procedures for @TODAY @  OCT, Retina - OU - Both Eyes       Right Eye Quality was good. Central Foveal Thickness: 258. Progression has been stable. Findings include normal foveal contour, no IRF, no SRF.   Left Eye Quality was good. Central Foveal Thickness: 258. Progression has been stable. Findings include normal foveal contour, no IRF, no SRF.   Notes *Images captured and stored on drive  Diagnosis / Impression:  OD: NFP, no IRF/SRF-normal study OS: NFP, no IRF/SRF-normal study   Clinical management:  See below  Abbreviations: NFP - Normal foveal profile. CME - cystoid macular edema. PED - pigment epithelial detachment. IRF - intraretinal fluid. SRF - subretinal fluid. EZ - ellipsoid zone. ERM - epiretinal membrane. ORA - outer retinal atrophy. ORT - outer retinal tubulation. SRHM - subretinal hyper-reflective material                 ASSESSMENT/PLAN:    ICD-10-CM   1. Posterior vitreous detachment of left eye  H43.812   2. Retinal edema  H35.81 OCT, Retina - OU - Both Eyes  3. Subconjunctival hemorrhage of right eye  H11.31   4.  Essential hypertension  I10   5. Hypertensive retinopathy of both eyes  H35.033   6. Combined forms of age-related cataract of both eyes  H25.813      1,2. PVD / vitreous syneresis OS  - Pt reports symptomatic floaters w/ intermittent flashes OS x2 days prior to initial presentation  - Discussed findings and prognosis  - No RT or RD on 360 scleral depressed exam  - Reviewed s/s of RT/RD  - Strict return precautions for any such RT/RD signs/symptoms  - pt is cleared from a retina standpoint for release back to her primary eye care provider  3. Sub conj hemorrhage OD -- stably resolved  - pt on plavix and ASA for history of cardiac stents  - pt reports history of rubbing OD correlating to onset of subconj heme  - discussed findings and prognosis  - monitor -- resolved today  4,5.Hypertensive retinopathy OU  - discussed importance of tight BP control  - monitor  6. Mixed age-related cataracts OU  - The symptoms of cataract, surgical options, and treatments and risks were discussed with patient.  - discussed diagnosis and progression  - not yet visually significant  - monitor for now     Ophthalmic -Meds Ordered this visit:  No orders of the defined types were placed in this encounter.      Return if symptoms worsen or fail to improve.  There are no Patient Instructions on file for this visit.   Explained the diagnoses, plan, and follow up with the patient and they expressed understanding.  Patient expressed understanding of the importance of proper follow up care.    Electronically signed by: Cristopher Estimable, COT 04/25/19 3:40 p.m.  This document serves as a record of services personally performed by Karie Chimera, MD, PhD. It was created on their behalf by Laurian Brim, OA, an ophthalmic assistant. The creation of this record is the provider's dictation and/or activities during the visit.    Electronically signed by: Laurian Brim, OA 10.06.2020 8:44 PM   Karie Chimera, M.D., Ph.D. Diseases & Surgery of the Retina and Vitreous [facility]  I have reviewed the above documentation for accuracy and completeness, and I agree with the above. Karie Chimera, M.D., Ph.D. 05/01/19 8:44 PM    Abbreviations: M myopia (nearsighted); A astigmatism; H hyperopia (farsighted); P presbyopia; Mrx spectacle prescription;  CTL contact lenses; OD right eye; OS left eye; OU both eyes  XT exotropia; ET esotropia; PEK punctate epithelial keratitis; PEE punctate epithelial erosions; DES dry eye syndrome; MGD meibomian gland dysfunction; ATs artificial tears; PFAT's preservative free artificial tears; NSC nuclear sclerotic cataract; PSC posterior subcapsular cataract; ERM epi-retinal membrane; PVD posterior vitreous detachment; RD retinal detachment; DM diabetes mellitus; DR diabetic retinopathy; NPDR non-proliferative diabetic retinopathy; PDR proliferative diabetic retinopathy; CSME clinically significant macular edema; DME  diabetic macular edema; dbh dot blot hemorrhages; CWS cotton wool spot; POAG primary open angle glaucoma; C/D cup-to-disc ratio; HVF humphrey visual field; GVF goldmann visual field; OCT optical coherence tomography; IOP intraocular pressure; BRVO Branch retinal vein occlusion; CRVO central retinal vein occlusion; CRAO central retinal artery occlusion; BRAO branch retinal artery occlusion; RT retinal tear; SB scleral buckle; PPV pars plana vitrectomy; VH Vitreous hemorrhage; PRP panretinal laser photocoagulation; IVK intravitreal kenalog; VMT vitreomacular traction; MH Macular hole;  NVD neovascularization of the disc; NVE neovascularization elsewhere; AREDS age related eye disease study; ARMD age related macular degeneration; POAG primary open angle glaucoma; EBMD epithelial/anterior basement membrane dystrophy; ACIOL anterior chamber intraocular lens; IOL intraocular lens; PCIOL posterior chamber intraocular lens; Phaco/IOL  phacoemulsification with intraocular lens placement; PRK photorefractive keratectomy; LASIK laser assisted in situ keratomileusis; HTN hypertension; DM diabetes mellitus; COPD chronic obstructive pulmonary disease

## 2019-04-27 ENCOUNTER — Other Ambulatory Visit (HOSPITAL_COMMUNITY)
Admission: RE | Admit: 2019-04-27 | Discharge: 2019-04-27 | Disposition: A | Discharge status: Home / Self Care | Payer: Medicare Other | Source: Ambulatory Visit | Attending: Cardiovascular Disease | Admitting: Cardiovascular Disease

## 2019-04-27 DIAGNOSIS — Z01812 Encounter for preprocedural laboratory examination: Secondary | ICD-10-CM | POA: Insufficient documentation

## 2019-04-27 DIAGNOSIS — Z20828 Contact with and (suspected) exposure to other viral communicable diseases: Secondary | ICD-10-CM | POA: Diagnosis not present

## 2019-04-28 ENCOUNTER — Encounter (HOSPITAL_BASED_OUTPATIENT_CLINIC_OR_DEPARTMENT_OTHER): Payer: Medicare Other | Admitting: Cardiovascular Disease

## 2019-04-29 LAB — NOVEL CORONAVIRUS, NAA (HOSP ORDER, SEND-OUT TO REF LAB; TAT 18-24 HRS): SARS-CoV-2, NAA: NOT DETECTED

## 2019-04-30 ENCOUNTER — Ambulatory Visit (HOSPITAL_BASED_OUTPATIENT_CLINIC_OR_DEPARTMENT_OTHER): Payer: Medicare Other | Admitting: Cardiovascular Disease

## 2019-05-01 ENCOUNTER — Encounter (INDEPENDENT_AMBULATORY_CARE_PROVIDER_SITE_OTHER): Payer: Self-pay | Admitting: Ophthalmology

## 2019-05-01 ENCOUNTER — Ambulatory Visit (INDEPENDENT_AMBULATORY_CARE_PROVIDER_SITE_OTHER): Payer: Medicare Other | Admitting: Ophthalmology

## 2019-05-01 DIAGNOSIS — H43812 Vitreous degeneration, left eye: Secondary | ICD-10-CM | POA: Diagnosis not present

## 2019-05-01 DIAGNOSIS — H1131 Conjunctival hemorrhage, right eye: Secondary | ICD-10-CM | POA: Diagnosis not present

## 2019-05-01 DIAGNOSIS — I10 Essential (primary) hypertension: Secondary | ICD-10-CM

## 2019-05-01 DIAGNOSIS — H3581 Retinal edema: Secondary | ICD-10-CM | POA: Diagnosis not present

## 2019-05-01 DIAGNOSIS — H35033 Hypertensive retinopathy, bilateral: Secondary | ICD-10-CM | POA: Diagnosis not present

## 2019-05-01 DIAGNOSIS — H25813 Combined forms of age-related cataract, bilateral: Secondary | ICD-10-CM

## 2019-05-02 ENCOUNTER — Other Ambulatory Visit: Payer: Self-pay | Admitting: Cardiovascular Disease

## 2019-05-03 ENCOUNTER — Ambulatory Visit (HOSPITAL_BASED_OUTPATIENT_CLINIC_OR_DEPARTMENT_OTHER): Payer: Medicare Other | Attending: Cardiovascular Disease | Admitting: Cardiovascular Disease

## 2019-05-03 DIAGNOSIS — M1711 Unilateral primary osteoarthritis, right knee: Secondary | ICD-10-CM | POA: Diagnosis not present

## 2019-05-03 DIAGNOSIS — G4733 Obstructive sleep apnea (adult) (pediatric): Secondary | ICD-10-CM | POA: Insufficient documentation

## 2019-05-03 DIAGNOSIS — Z7982 Long term (current) use of aspirin: Secondary | ICD-10-CM | POA: Insufficient documentation

## 2019-05-03 DIAGNOSIS — Z7952 Long term (current) use of systemic steroids: Secondary | ICD-10-CM | POA: Diagnosis not present

## 2019-05-03 DIAGNOSIS — M5417 Radiculopathy, lumbosacral region: Secondary | ICD-10-CM | POA: Diagnosis not present

## 2019-05-03 DIAGNOSIS — Z791 Long term (current) use of non-steroidal anti-inflammatories (NSAID): Secondary | ICD-10-CM | POA: Insufficient documentation

## 2019-05-03 DIAGNOSIS — G4736 Sleep related hypoventilation in conditions classified elsewhere: Secondary | ICD-10-CM | POA: Diagnosis not present

## 2019-05-03 DIAGNOSIS — Z79899 Other long term (current) drug therapy: Secondary | ICD-10-CM | POA: Insufficient documentation

## 2019-05-03 DIAGNOSIS — IMO0002 Reserved for concepts with insufficient information to code with codable children: Secondary | ICD-10-CM

## 2019-05-03 DIAGNOSIS — M25561 Pain in right knee: Secondary | ICD-10-CM | POA: Diagnosis not present

## 2019-05-12 NOTE — Procedures (Signed)
Patient Name: Lindsay [name], Lindsay [name] Date: 05/03/2019 Gender: Female D.O.B: Apr 06, 1956 Age (years): 71 Referring Provider: Skeet Latch Height (inches): 62 Interpreting Physician: Shelva Majestic MD, ABSM Weight (lbs): 184 RPSGT: Baxter Flattery BMI: 34 MRN: [id number] Neck Size: 16.00  CLINICAL INFORMATION The patient is referred for a CPAP titration to treat sleep apnea.  Date of NPSG: 03/22/2019: AHI 30.3/h; RDI 30.3/h; REM AHI 90.0%; O2% nadir 74%; loud snoring.  SLEEP STUDY TECHNIQUE As per the AASM Manual for the Scoring of Sleep and Associated Events v2.3 (April 2016) with a hypopnea requiring 4% desaturations.  The channels recorded and monitored were frontal, central and occipital EEG, electrooculogram (EOG), submentalis EMG (chin), nasal and oral airflow, thoracic and abdominal wall motion, anterior tibialis EMG, snore microphone, electrocardiogram, and pulse oximetry. Continuous positive airway pressure (CPAP) was initiated at the beginning of the study and titrated to treat sleep-disordered breathing.  MEDICATIONS     amLODipine (NORVASC) 2.5 MG tablet             aspirin EC 81 MG tablet         b complex vitamins tablet         BIOTIN PO         calcium carbonate (TUMS - DOSED IN MG ELEMENTAL CALCIUM) 500 MG chewable tablet         clopidogrel (PLAVIX) 75 MG tablet         Coenzyme Q10 (COQ-10 PO)         Cyanocobalamin (B-12 PO)         diclofenac sodium (VOLTAREN) 1 % GEL         DULoxetine (CYMBALTA) 30 MG capsule         esomeprazole (NEXIUM) 20 MG capsule         hydrOXYzine (VISTARIL) 50 MG capsule         ibuprofen (ADVIL,MOTRIN) 200 MG tablet         losartan (COZAAR) 100 MG tablet         metoprolol tartrate (LOPRESSOR) 25 MG tablet         nitroGLYCERIN (NITROSTAT) 0.4 MG SL tablet         Omega-3 Fatty Acids (OMEGA 3 PO)         oxymetazoline (AFRIN) 0.05 % nasal spray         predniSONE (DELTASONE) 10 MG tablet         VITAMIN D PO       Medications self-administered by patient taken the night of the study : AMLODIPINE, METOPROLOL  TECHNICIAN COMMENTS Comments added by technician: Patient had difficulty initiating sleep. RESMED NASAL MASK AIR-FIT P30I WAS USED FOR THIS STUDY Comments added by scorer: N/A  RESPIRATORY PARAMETERS Optimal PAP Pressure (cm): 11 AHI at Optimal Pressure (/hr): 4.1 Overall Minimal O2 (%): 85.0 Supine % at Optimal Pressure (%): 100 Minimal O2 at Optimal Pressure (%): 87.0   SLEEP ARCHITECTURE The study was initiated at 11:20:03 PM and ended at 5:22:18 AM.  Sleep onset time was 27.6 minutes and the sleep efficiency was 79.7%%. The total sleep time was 288.6 minutes.  The patient spent 8.0%% of the night in stage N1 sleep, 84.8%% in stage N2 sleep, 0.0%% in stage N3 and 7.3% in REM.Stage REM latency was 264.5 minutes  Wake after sleep onset was 46.0. Alpha intrusion was absent. Supine sleep was 100.00%.  CARDIAC DATA The 2 lead EKG demonstrated sinus rhythm. The mean heart rate was 72.2 beats per minute. Other  EKG findings include: None.  LEG MOVEMENT DATA The total Periodic Limb Movements of Sleep (PLMS) were 0. The PLMS index was 0.0. A PLMS index of <15 is considered normal in adults.  IMPRESSIONS - CPAP was initiated at 5 and was titrated to 11 cm of water. AHI at 11 cm was 4.1/h: O2% nadir 87% with 2 central events over 50:23 minutes.. - Central sleep apnea was noted during this titration (CAI = 0.8/h). - Moderate oxygen desaturations to a nadir of 85% at 7cm of water. - No snoring was audible during this study. - No cardiac abnormalities were observed during this study. - Clinically significant periodic limb movements were not noted during this study. Arousals associated with PLMs were rare.  DIAGNOSIS - Obstructive Sleep Apnea (327.23 [G47.33 ICD-10])   RECOMMENDATIONS - Recommend an initial trial of CPAP Auto therapy with EPR of 3 at 10 - 16 cm H2O with heated humidification. A  Small size Resmed Nasal Mask AirFit P30I mask was used for the titration. - Effort should be made to optimize nasal and oropharyngeal patency. - Avoid alcohol, sedatives and other CNS depressants that may worsen sleep apnea and disrupt normal sleep architecture. - Sleep hygiene should be reviewed to assess factors that may improve sleep quality. - Weight management and regular exercise should be initiated or continued. - Recommend a download in 30 days and sleep clinic evaluation after 4 weeks of therapy.    [Electronically signed] 05/12/2019 11:00 AM  Nicki Guadalajara MD, [facility], ABSM Diplomate, American Board of Sleep Medicine   NPI: [id number] [facility] PH: [phone]   FX: [phone] ACCREDITED BY THE AMERICAN ACADEMY OF SLEEP MEDICINE

## 2019-05-16 ENCOUNTER — Telehealth: Payer: Self-pay | Admitting: *Deleted

## 2019-05-16 NOTE — Telephone Encounter (Signed)
Sleep study has been completed. Order for CPAP machine has been sent to [facility]. If she does not hear back from them within the next week she should contact them for order status. [facility] contact information provided.

## 2019-05-17 DIAGNOSIS — M545 Low back pain: Secondary | ICD-10-CM | POA: Diagnosis not present

## 2019-05-17 DIAGNOSIS — M546 Pain in thoracic spine: Secondary | ICD-10-CM | POA: Diagnosis not present

## 2019-05-23 DIAGNOSIS — F3170 Bipolar disorder, currently in remission, most recent episode unspecified: Secondary | ICD-10-CM | POA: Diagnosis not present

## 2019-06-17 ENCOUNTER — Other Ambulatory Visit: Payer: Self-pay | Admitting: Physician Assistant

## 2019-07-03 ENCOUNTER — Telehealth: Payer: Self-pay | Admitting: Cardiovascular Disease

## 2019-07-03 NOTE — Telephone Encounter (Signed)
Left message for patient to call and schedule Sleep compliance appointment with Dr. Cathlean Marseilles to be between 08/28/19 and 09/23/19

## 2019-07-10 NOTE — Telephone Encounter (Signed)
Left message for patient to call and schedule Sleep compliance appointment ( between 08/27/19 and 2/28/21with Dr. Claiborne Billings)

## 2019-07-18 ENCOUNTER — Encounter: Payer: Self-pay | Admitting: Cardiovascular Disease

## 2019-07-18 ENCOUNTER — Telehealth: Payer: Self-pay | Admitting: *Deleted

## 2019-07-18 NOTE — Telephone Encounter (Signed)
Left message 07/03/19--07/10/19 and 07/18/19 for patient to call and schedule Sleep Compliance appointment with Dr. Coralee Pesa be scheduled prior to 09/24/19  Will mail letter requesting patient to call and schedule

## 2019-07-31 ENCOUNTER — Other Ambulatory Visit: Payer: Self-pay | Admitting: Physician Assistant

## 2019-09-19 ENCOUNTER — Telehealth: Payer: Self-pay

## 2019-09-19 NOTE — Telephone Encounter (Signed)
Contacted patient to advise of switch providers.  She will be seeing Corine Shelter, [place] tomorrow at the same time-   Not able to leave message for patient to advise of this, voicemail was full.

## 2019-09-20 ENCOUNTER — Telehealth: Payer: Medicare Other | Admitting: Cardiology

## 2019-09-21 ENCOUNTER — Ambulatory Visit (INDEPENDENT_AMBULATORY_CARE_PROVIDER_SITE_OTHER): Payer: Medicare Other | Admitting: Cardiovascular Disease

## 2019-09-21 ENCOUNTER — Encounter: Payer: Self-pay | Admitting: Cardiovascular Disease

## 2019-09-21 ENCOUNTER — Other Ambulatory Visit: Payer: Self-pay

## 2019-09-21 VITALS — BP 107/65 | HR 71 | Temp 96.4°F | Ht 62.0 in | Wt 185.4 lb

## 2019-09-21 DIAGNOSIS — I480 Paroxysmal atrial fibrillation: Secondary | ICD-10-CM

## 2019-09-21 DIAGNOSIS — I2510 Atherosclerotic heart disease of native coronary artery without angina pectoris: Secondary | ICD-10-CM

## 2019-09-21 DIAGNOSIS — G4733 Obstructive sleep apnea (adult) (pediatric): Secondary | ICD-10-CM | POA: Diagnosis not present

## 2019-09-21 DIAGNOSIS — G4736 Sleep related hypoventilation in conditions classified elsewhere: Secondary | ICD-10-CM | POA: Diagnosis not present

## 2019-09-21 DIAGNOSIS — I10 Essential (primary) hypertension: Secondary | ICD-10-CM | POA: Diagnosis not present

## 2019-09-21 DIAGNOSIS — E785 Hyperlipidemia, unspecified: Secondary | ICD-10-CM

## 2019-09-21 DIAGNOSIS — IMO0002 Reserved for concepts with insufficient information to code with codable children: Secondary | ICD-10-CM

## 2019-09-21 NOTE — Patient Instructions (Signed)

## 2019-09-21 NOTE — Progress Notes (Signed)
Cardiology Office Note    Date:  09/23/2019   ID:  Lindsay [name], DOB 1955-12-11, MRN [id number]  PCP:  Patient, No Pcp Per  Cardiologist:  Shelva Majestic, MD   Initial sleep evaluation  History of Present Illness:  Lindsay [name] is a 64 y.o. female who presents for initial evaluation with me regarding recent diagnosis of obstructive sleep apnea and initiation of CPAP therapy.  Lindsay [name] is a 64 year old female who has a history of hypertension, hyperlipidemia, CAD, status post MI in 2010, as well as a history of paroxysmal atrial fibrillation not on systemic anticoagulation secondary to remote GI bleed.  She had developed progressive chest discomfort and was evaluated by Dr. Oval Linsey and underwent repeat cardiac catheterization in March 2020 which showed 20% proximal RCA stenosis, 50% distal RCA stenosis, and 75% diagonal 1 stenosis treated with DES stenting.  Patient was last evaluated by Raynaldo Opitz, PA in May 2020.  The patient admits to previous significant sleep issues consisting of sore, thickening, proteinuria, nonrestorative sleep, as well as daytime sleepiness.  She is an Training and development officer.  She typically goes to bed late often between midnight and 2 AM oftentimes would wake up at 4 AM with her cat and ultimately may go back to bed and potentially sleep until 8 or 9 AM.  She ultimately was referred for a diagnostic polysomnogram which was done on March 22, 2019.  This revealed severe sleep apnea with an AHI of 30.3 but sleep apnea was very severe during REM sleep at 90/h and oxygen nadir was 74%.  There was loud snoring.  On May 03, 2019 she underwent a CPAP titration study and was titrated up to 11 cm.  AHI at 11 cm was 4.1/h with O2 nadir 87%.  Lindsay [name] has been on CPAP therapy since her set up date of June 26, 2019.  [facility] as her DME company.  She has a ResMed air sense 10 AutoSet CPAP unit that has been set at a minimum pressure of 10 with maximum pressure of 16.  A download obtained in  the office today from August 23, 2019 through September 21, 2019.  Usage days is 87% and usage greater than 4 hours 77%.  She was averaging 5 hours and 50 minutes of CPAP use per night.  AHI is 3.0.  Most nights there was no significant leak but she did have increased leak on several nights.  She has a full facemask.  Since initiating CPAP therapy, she believes she is sleeping much better.  Her previous 3-4 times per night nocturia has improved 0-1 time per night.  She no longer is significantly tired during the day.  She is unaware of any breakthrough snoring .  She believes her sleep is nonrestorative.  A new Epworth Sleepiness Scale score was calculated in the office today this endorsed at 9 as shown below:  Epworth Sleepiness Scale: Situation   Chance of Dozing/Sleeping (0 = never , 1 = slight chance , 2 = moderate chance , 3 = high chance )   sitting and reading 1   watching TV 3   sitting inactive in a public place 0   being a passenger in a motor vehicle for an hour or more 2   lying down in the afternoon 3   sitting and talking to someone 0   sitting quietly after lunch (no alcohol) 0   while stopped for a few minutes in traffic as the driver 0   Total  Score  9   She denies any bruxism, hypnagogic hallucinations or cataplectic events.  She presents for evaluation.   Past Medical History:  Diagnosis Date   Anginal pain ([place])    Atrial fibrillation ([place])    Bipolar disorder ([place])    Cataract    OU   Coronary artery disease involving native coronary artery with angina pectoris ([place]) 03/24/2015   Dysrhythmia    GERD (gastroesophageal reflux disease)    History of appendectomy 1963   Hypertension    Hypertensive retinopathy    OU   Myocardial infarction ([place])    per pt due to Vit D deficiency   Vitamin D deficiency     Past Surgical History:  Procedure Laterality Date   APPENDECTOMY     CARDIAC CATHETERIZATION N/A 03/25/2015   Procedure: Left Heart Cath and  Coronary Angiography;  Surgeon: Leonie Man, MD;  Location: [facility];  Service: Cardiovascular;  Laterality: N/A;   CORONARY STENT INTERVENTION N/A 10/09/2018   Procedure: CORONARY STENT INTERVENTION;  Surgeon: Jettie Booze, MD;  Location: [facility];  Service: Cardiovascular;  Laterality: N/A;   DILATATION & CURETTAGE/HYSTEROSCOPY WITH MYOSURE N/A 05/19/2016   Procedure: DILATATION & CURETTAGE/HYSTEROSCOPY WITH MYOSURE;  Surgeon: Thurnell Lose, MD;  Location: [facility];  Service: Gynecology;  Laterality: N/A;  Possible Myosure for polyps.   EYE SURGERY     LEFT HEART CATH AND CORONARY ANGIOGRAPHY N/A 10/09/2018   Procedure: LEFT HEART CATH AND CORONARY ANGIOGRAPHY;  Surgeon: Jettie Booze, MD;  Location: [facility];  Service: Cardiovascular;  Laterality: N/A;   MOUTH SURGERY     URETHRAL DILATION      Current Medications: Outpatient Medications Prior to Visit  Medication Sig Dispense Refill   amLODipine (NORVASC) 2.5 MG tablet TAKE ONE TABLET BY MOUTH ONE TIME DAILY 90 tablet 0   aspirin EC 81 MG tablet Take 81 mg by mouth at bedtime.      b complex vitamins tablet Take 1 tablet by mouth daily.     BIOTIN PO Take 1 tablet by mouth daily.     calcium carbonate (TUMS - DOSED IN MG ELEMENTAL CALCIUM) 500 MG chewable tablet Chew 2 tablets by mouth 3 (three) times daily as needed for indigestion or heartburn.      clopidogrel (PLAVIX) 75 MG tablet TAKE ONE TABLET BY MOUTH ONE TIME DAILY 30 tablet 10   Coenzyme Q10 (COQ-10 PO) Take 1 tablet by mouth daily.     Cyanocobalamin (B-12 PO) Take 1 tablet by mouth daily.     diclofenac sodium (VOLTAREN) 1 % GEL diclofenac 1 % topical gel  APPLY TWO GRAMS TOPICALLY TO AFFECTED AREA TWO TIMES A DAY     esomeprazole (NEXIUM) 20 MG capsule Take 20 mg by mouth daily.      hydrOXYzine (VISTARIL) 50 MG capsule Take 50 mg by mouth at bedtime as needed for anxiety (sleep).   0   ibuprofen  (ADVIL,MOTRIN) 200 MG tablet Take 200 mg by mouth 2 (two) times daily as needed for moderate pain.     losartan (COZAAR) 100 MG tablet Take 100 mg by mouth daily.  3   metoprolol tartrate (LOPRESSOR) 25 MG tablet TAKE ONE-HALF TABLET BY MOUTH TWICE A DAY 90 tablet 0   nitroGLYCERIN (NITROSTAT) 0.4 MG SL tablet Place 1 tablet (0.4 mg total) under the tongue as needed for chest pain. 25 tablet PRN   Omega-3 Fatty Acids (OMEGA 3 PO) Take 1  capsule by mouth daily.     VITAMIN D PO Take 2 capsules by mouth daily.     DULoxetine (CYMBALTA) 30 MG capsule Take 30 mg by mouth daily.     oxymetazoline (AFRIN) 0.05 % nasal spray Place 1 spray into both nostrils daily as needed for congestion.     predniSONE (DELTASONE) 10 MG tablet prednisone 10 mg tablet  Take 6 tablets po x1 day, 5 tablets po x 1 day, 4 tablets po x 1 day, 3 tablets po x 1 day, 2 tablets po x 1 day, 1 tablet po x 1 day     No facility-administered medications prior to visit.     Allergies:   Bee venom, Sulfa antibiotics, Ciprofloxacin, Depakote [divalproex sodium], Risperidone, Seroquel [quetiapine fumarate], Statins, Tramadol, Citalopram, Lamotrigine, Topiramate, and Zetia [ezetimibe]   Social History   Socioeconomic History   Marital status: Single    Spouse name: Not on file   Number of children: Not on file   Years of education: Not on file   Highest education level: Not on file  Occupational History   Not on file  Tobacco Use   Smoking status: Never Smoker   Smokeless tobacco: Never Used  Substance and Sexual Activity   Alcohol use: Yes    Alcohol/week: 2.0 standard drinks    Types: 2 Standard drinks or equivalent per week    Comment: rarely   Drug use: No   Sexual activity: Not on file  Other Topics Concern   Not on file  Social History Narrative   Not on file   Social Determinants of Health   Financial Resource Strain:    Difficulty of Paying Living Expenses: Not on file  Food  Insecurity:    Worried About [place] in the Last Year: Not on file   Ran Out of Food in the Last Year: Not on file  Transportation Needs:    Lack of Transportation (Medical): Not on file   Lack of Transportation (Non-Medical): Not on file  Physical Activity:    Days of Exercise per Week: Not on file   Minutes of Exercise per Session: Not on file  Stress:    Feeling of Stress : Not on file  Social Connections:    Frequency of Communication with Friends and Family: Not on file   Frequency of Social Gatherings with Friends and Family: Not on file   Attends Religious Services: Not on file   Active Member of Clubs or Organizations: Not on file   Attends Archivist Meetings: Not on file   Marital Status: Not on file    Socially she was born in [place].  She is single.  She is an Training and development officer in Nurse, adult.  She has a cat.  She does not have children.  Family History:  The patient's family history includes Dementia in her mother; Heart attack in her maternal grandfather, maternal grandmother, and paternal grandfather; Kidney failure in her paternal grandmother; Vascular Disease in her mother.   Her father died at age 3.  Her mother died at age 1 and had dementia.  She had 1 brother who died and had cerebral palsy and ultimately developed a MRSA infection.  ROS General: Negative; No fevers, chills, or night sweats; positive mild obesity HEENT: Negative; No changes in vision or hearing, sinus congestion, difficulty swallowing Pulmonary: Negative; No cough, wheezing, shortness of breath, hemoptysis Cardiovascular: Positive for CAD, PAF GI: Positive for remote history of GI bleed  GU: Negative; No dysuria, hematuria, or difficulty voiding Musculoskeletal: Negative; no myalgias, joint pain, or weakness Hematologic/Oncology: Negative; no easy bruising, bleeding Endocrine: Negative; no heat/cold intolerance; no diabetes Neuro: Negative; no changes  in balance, headaches Skin: Negative; No rashes or skin lesions Psychiatric: Negative; No behavioral problems, depression Sleep: See HPI Other comprehensive 14 point system review is negative.   PHYSICAL EXAM:   VS:  BP 107/65    Pulse 71    Temp (!) 96.4 F (35.8 C)    Ht '5\' 2"'$  (1.575 m)    Wt 185 lb 6.4 oz (84.1 kg)    SpO2 97%    BMI 33.91 kg/m     Repeat blood pressure by me 124/70  Wt Readings from Last 3 Encounters:  09/21/19 185 lb 6.4 oz (84.1 kg)  05/03/19 184 lb (83.5 kg)  03/22/19 180 lb (81.6 kg)    General: Alert, oriented, no distress.  Skin: normal turgor, no rashes, warm and dry HEENT: Normocephalic, atraumatic. Pupils equal round and reactive to light; sclera anicteric; extraocular muscles intact;  Nose without nasal septal hypertrophy Mouth/Parynx benign; Mallinpatti scale 3 Neck: No JVD, no carotid bruits; normal carotid upstroke Lungs: clear to ausculatation and percussion; no wheezing or rales Chest wall: without tenderness to palpitation Heart: PMI not displaced, RRR, s1 s2 normal, 1/6 systolic murmur, no diastolic murmur, no rubs, gallops, thrills, or heaves Abdomen: soft, nontender; no hepatosplenomehaly, BS+; abdominal aorta nontender and not dilated by palpation. Back: no CVA tenderness Pulses 2+ Musculoskeletal: full range of motion, normal strength, no joint deformities Extremities: no clubbing cyanosis or edema, Homan's sign negative  Neurologic: grossly nonfocal; Cranial nerves grossly wnl Psychologic: Normal mood and affect   Studies/Labs Reviewed:   ECG from October 10, 2018 was personally reviewed by me and reveals normal sinus rhythm at 70 and left bundle branch block with repolarization changes  Recent Labs: BMP Latest Ref Rng & Units 10/02/2018 05/11/2016 04/02/2016  Glucose 65 - 99 mg/dL 103(H) 108(H) 106(H)  BUN 8 - 27 mg/dL '13 12 15  '$ Creatinine 0.57 - 1.00 mg/dL 0.71 0.76 0.88  BUN/Creat Ratio 12 - 28 18 - -  Sodium 134 - 144 mmol/L 140  140 139  Potassium 3.5 - 5.2 mmol/L 5.0 4.9 4.1  Chloride 96 - 106 mmol/L 99 103 99  CO2 20 - 29 mmol/L '24 29 29  '$ Calcium 8.7 - 10.3 mg/dL 10.2 10.3 10.4     Hepatic Function Latest Ref Rng & Units 10/02/2018 04/02/2016 03/24/2015  Total Protein 6.0 - 8.5 g/dL 6.9 7.3 7.4  Albumin 3.8 - 4.8 g/dL 4.5 4.7 4.8  AST 0 - 40 IU/L 31 23 34  ALT 0 - 32 IU/L 24 18 44(H)  Alk Phosphatase 39 - 117 IU/L 88 74 89  Total Bilirubin 0.0 - 1.2 mg/dL 0.6 0.9 0.4    CBC Latest Ref Rng & Units 10/02/2018 05/11/2016 03/24/2015  WBC 3.4 - 10.8 x10E3/uL 4.4 4.8 5.9  Hemoglobin 11.1 - 15.9 g/dL 13.9 13.9 14.0  Hematocrit 34.0 - 46.6 % 41.7 40.8 41.2  Platelets 150 - 450 x10E3/uL 254 231 274   Lab Results  Component Value Date   MCV 95 10/02/2018   MCV 92.9 05/11/2016   MCV 93.2 03/24/2015   Lab Results  Component Value Date   TSH 3.190 10/02/2018   No results found for: HGBA1C   BNP No results found for: BNP  ProBNP No results found for: PROBNP   Lipid Panel  Component Value Date/Time   CHOL 287 (H) 10/02/2018 1519   TRIG 121 10/02/2018 1519   HDL 59 10/02/2018 1519   CHOLHDL 4.9 (H) 10/02/2018 1519   CHOLHDL 4.3 04/02/2016 1554   VLDL 44 (H) 04/02/2016 1554   LDLCALC 204 (H) 10/02/2018 1519   LABVLDL 24 10/02/2018 1519     RADIOLOGY: No results found.   Additional studies/ records that were reviewed today include:   Sleep study: March 22, 2019 SLEEP ARCHITECTURE The study was initiated at 10:49:08 PM and ended at 5:33:20 AM.  Sleep onset time was 26.9 minutes and the sleep efficiency was 84.4%%. The total sleep time was 341 minutes.  Stage REM latency was 346.0 minutes.  The patient spent 5.7%% of the night in stage N1 sleep, 85.5%% in stage N2 sleep, 0.0%% in stage N3 and 8.8% in REM.  Alpha intrusion was absent.  Supine sleep was 64.30%.  RESPIRATORY PARAMETERS The overall apnea/hypopnea index (AHI) was 30.3 per hour. The respiratory disturbance index (RDI)  was 30.3/h. There were 63 total apneas, including 60 obstructive, 3 central and 0 mixed apneas. There were 109 hypopneas and 0 RERAs.  The AHI during Stage REM sleep was 90.0 per hour.  AHI while supine was 34.8 per hour.  The mean oxygen saturation was 89.8%. The minimum SpO2 during sleep was 74.0%.  Loud snoring was noted during this study.  CARDIAC DATA The 2 lead EKG demonstrated sinus rhythm. The mean heart rate was 68.5 beats per minute. Other EKG findings include: Atrial Fibrillation.  LEG MOVEMENT DATA The total PLMS were 0 with a resulting PLMS index of 0.0. Associated arousal with leg movement index was 0.0 .  IMPRESSIONS - Severe obstructive sleep apnea (AHI 30.3/h); however, events were worse with supine sleep (AHI 34.8/h) and very severe during REM sleep (AHI 90/h). - No significant central sleep apnea occurred during this study (CAI = 0.5/h). - Significant oxygen desaturation to a nadir of 74.0%. - Abnormal sleep architecture with absence of slow wave sleep and prolonged latency to REM sleep. - The patient snored with loud snoring volume. - EKG findings include Atrial Fibrillation. - Clinically significant periodic limb movements did not occur during sleep. No significant associated arousals.  DIAGNOSIS - Obstructive Sleep Apnea (327.23 [G47.33 ICD-10]) - Nocturnal Hypoxemia (327.26 [G47.36 ICD-10])  RECOMMENDATIONS - Therapeutic CPAP titration to determine optimal pressure required to alleviate sleep disordered breathing. - Effort should be made to optimize nasal and oropharyngeal patency. - Positional therapy avoiding supine position during sleep. - Avoid alcohol, sedatives and other CNS depressants that may worsen sleep apnea and disrupt normal sleep architecture. - Sleep hygiene should be reviewed to assess factors that may improve sleep quality. - Weight management (BMI 33) and regular exercise should be initiated or continued if appropriate.  CPAP  titration study: 05/03/2019 RESPIRATORY PARAMETERS Optimal PAP Pressure (cm):  11        AHI at Optimal Pressure (/hr):            4.1 Overall Minimal O2 (%):         85.0     Supine % at Optimal Pressure (%):    100 Minimal O2 at Optimal Pressure (%): 87.0       SLEEP ARCHITECTURE The study was initiated at 11:20:03 PM and ended at 5:22:18 AM.  Sleep onset time was 27.6 minutes and the sleep efficiency was 79.7%%. The total sleep time was 288.6 minutes.  The patient spent 8.0%% of the night in stage  N1 sleep, 84.8%% in stage N2 sleep, 0.0%% in stage N3 and 7.3% in REM.Stage REM latency was 264.5 minutes  Wake after sleep onset was 46.0. Alpha intrusion was absent. Supine sleep was 100.00%.  CARDIAC DATA The 2 lead EKG demonstrated sinus rhythm. The mean heart rate was 72.2 beats per minute. Other EKG findings include: None.  LEG MOVEMENT DATA The total Periodic Limb Movements of Sleep (PLMS) were 0. The PLMS index was 0.0. A PLMS index of <15 is considered normal in adults.  IMPRESSIONS - CPAP was initiated at 5 and was titrated to 11 cm of water. AHI at 11 cm was 4.1/h: O2% nadir 87% with 2 central events over 50:23 minutes.. - Central sleep apnea was noted during this titration (CAI = 0.8/h). - Moderate oxygen desaturations to a nadir of 85% at 7cm of water. - No snoring was audible during this study. - No cardiac abnormalities were observed during this study. - Clinically significant periodic limb movements were not noted during this study. Arousals associated with PLMs were rare.  DIAGNOSIS - Obstructive Sleep Apnea (327.23 [G47.33 ICD-10])   RECOMMENDATIONS - Recommend an initial trial of CPAP Auto therapy with EPR of 3 at 10 - 16 cm H2O with heated humidification. A Small size Resmed Nasal Mask AirFit P30I mask was used for the titration. - Effort should be made to optimize nasal and oropharyngeal patency. - Avoid alcohol, sedatives and other CNS depressants that may  worsen sleep apnea and disrupt normal sleep architecture. - Sleep hygiene should be reviewed to assess factors that may improve sleep quality. - Weight management and regular exercise should be initiated or continued. - Recommend a download in 30 days and sleep clinic evaluation after 4 weeks of therapy.   ASSESSMENT:    1. OSA (obstructive sleep apnea)   2. Sleep-related hypoventilation   3. Coronary artery disease involving native coronary artery of native heart without angina pectoris   4. Essential hypertension   5. PAF (paroxysmal atrial fibrillation) ([place])   6. 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  Remotely, the patient admitted to poor sleep with snoring, frequent awakening, nonrestorative sleep, and residual daytime sleepiness.  I spent considerable time with her today reviewing normal sleep architecture and the effect of sleep apnea and its disruption.  We specifically discussed the effects of untreated sleep apnea with reference to adverse cardiovascular health including difficult to control hypertension, potential for nocturnal arrhythmias with significant increased risk for atrial fibrillation development, potential ischemic complications with nocturnal angina/MI or cerebrovascular events in addition to its effect on glucose metabolism and GERD.  I thoroughly reviewed her diagnostic polysomnogram in detail which revealed severe obstructive sleep apnea with an overall AHI at 30.3 times per hour and extremely severe sleep apnea during REM sleep with an AHI of 99/h.  I see any oxygen desaturation to a nadir of 74% and had loud snoring throughout the study.  I  reviewed her CPAP study with her in detail.  I obtained a new download in the office today which verifies that she is meeting compliance standards.  However prior to this evaluation she had no understanding of sleep apnea and did not realize it  potentially adversely affected her cardiovascular health.  I discussed with her that the preponderance of deep REM sleep occurs in the second half of the night and the importance of using CPAP therapy for the nights duration.  We discussed ideal sleep duration at 8 hours.  Her most recent download demonstrates inadequate sleep duration at only 5 hours and 50 minutes.  Her AHI is controlled at 3.0 on her current pressure settings.  However, since her 95th percentile pressure is 14.1 with a maximum average pressure of 15.1 I have recommended we extend the upper pressure up to 18 cm of water.  I am also reducing her ramp time from 45 minutes down to 20 minutes and will increase her ramp start pressure to 6.  I discussed the implications of optimal therapy particularly with reference to her cardiovascular health.  She has been using a full facemask.  I discussed with her new mask technology feel she would benefit from a trial of the ResMed air fit F 30i mask particularly with the tubing coming from the top of the head rather than the side which will allow for more optimal movement during sleep.her blood pressure today is controlled amlodipine 5 mg, losartan 100 mg in addition to metoprolol tartrate 12.5 mg twice a day.  It does not appear that she is on statin therapy or Zetia due to questionable allergies.  I have recommended that she follow-up with Dr. Oval Linsey.  She may benefit from [place] particularly with previously documented marked LDL levels at 204.  I spent in excess of 40 minutes with greater than 50% time with the patient discussing her sleep apnea as well as her cardiovascular illnesses and need for optimal blood pressure control, weight reduction, and increased  exercise.  We discussed the need for more optimal compliance even though she is meeting compliance standards.  I will see her in 1 year for follow-up evaluation.   Medication Adjustments/Labs and Tests Ordered: Current medicines are reviewed at length with the patient today.  Concerns regarding medicines are outlined above.  Medication changes, Labs and Tests ordered today are listed in the Patient Instructions below. Patient Instructions  Medication Instructions:  CONTINUE WITH CURRENT MEDICATIONS. NO CHANGES.  *If you need a refill on your cardiac medications before your next appointment, please call your pharmacy*   Follow-Up: At [facility], you and your health needs are our priority.  As part of our continuing mission to provide you with exceptional heart care, we have created designated Provider Care Teams.  These Care Teams include your primary Cardiologist (physician) and Advanced Practice Providers (APPs -  Physician Assistants and Nurse Practitioners) who all work together to provide you with the care you need, when you need it.  We recommend signing up for the patient portal called "MyChart".  Sign up information is provided on this After Visit Summary.  MyChart is used to connect with patients for Virtual Visits (Telemedicine).  Patients are able to view lab/test results, encounter notes, upcoming appointments, etc.  Non-urgent messages can be sent to your provider as well.   To learn more about what you can do with MyChart, go to [email or web address].    Your next appointment:   12 month(s)  The format for your next appointment:   In Person  Provider:   Shelva Majestic, MD        Signed, Shelva Majestic, MD  09/23/2019 [facility] [address], [place], [place]  [address] Phone: [phone]

## 2019-09-23 ENCOUNTER — Encounter: Payer: Self-pay | Admitting: Cardiovascular Disease

## 2019-09-24 ENCOUNTER — Telehealth: Payer: Self-pay | Admitting: *Deleted

## 2019-09-24 NOTE — Telephone Encounter (Signed)
Order for ResMed Airview F-30i mask faxed to [facility].

## 2019-10-11 MED ORDER — AMLODIPINE BESYLATE 2.5 MG PO TABS: 2.5000 mg | ORAL_TABLET | Freq: Every day | ORAL | 0 refills | Status: DC

## 2019-11-24 DIAGNOSIS — R55 Syncope and collapse: Secondary | ICD-10-CM

## 2019-11-24 HISTORY — DX: Syncope and collapse: R55

## 2019-12-05 ENCOUNTER — Encounter (HOSPITAL_COMMUNITY): Payer: Self-pay | Admitting: Pharmacy Technician

## 2019-12-05 ENCOUNTER — Emergency Department (HOSPITAL_COMMUNITY): Payer: Medicare Other

## 2019-12-05 ENCOUNTER — Emergency Department (HOSPITAL_COMMUNITY)
Admission: EM | Admit: 2019-12-05 | Discharge: 2019-12-05 | Disposition: A | Discharge status: Home / Self Care | Payer: Medicare Other | Attending: Emergency Medicine | Admitting: Emergency Medicine

## 2019-12-05 ENCOUNTER — Other Ambulatory Visit: Payer: Self-pay

## 2019-12-05 DIAGNOSIS — R4182 Altered mental status, unspecified: Secondary | ICD-10-CM | POA: Diagnosis not present

## 2019-12-05 DIAGNOSIS — M542 Cervicalgia: Secondary | ICD-10-CM | POA: Diagnosis not present

## 2019-12-05 DIAGNOSIS — S3993XA Unspecified injury of pelvis, initial encounter: Secondary | ICD-10-CM | POA: Diagnosis not present

## 2019-12-05 DIAGNOSIS — E161 Other hypoglycemia: Secondary | ICD-10-CM | POA: Diagnosis not present

## 2019-12-05 DIAGNOSIS — I2510 Atherosclerotic heart disease of native coronary artery without angina pectoris: Secondary | ICD-10-CM | POA: Diagnosis not present

## 2019-12-05 DIAGNOSIS — S3992XA Unspecified injury of lower back, initial encounter: Secondary | ICD-10-CM | POA: Diagnosis not present

## 2019-12-05 DIAGNOSIS — W19XXXA Unspecified fall, initial encounter: Secondary | ICD-10-CM | POA: Diagnosis not present

## 2019-12-05 DIAGNOSIS — Z7901 Long term (current) use of anticoagulants: Secondary | ICD-10-CM | POA: Diagnosis not present

## 2019-12-05 DIAGNOSIS — R519 Headache, unspecified: Secondary | ICD-10-CM | POA: Diagnosis not present

## 2019-12-05 DIAGNOSIS — M545 Low back pain: Secondary | ICD-10-CM | POA: Insufficient documentation

## 2019-12-05 DIAGNOSIS — E162 Hypoglycemia, unspecified: Secondary | ICD-10-CM | POA: Diagnosis not present

## 2019-12-05 DIAGNOSIS — S299XXA Unspecified injury of thorax, initial encounter: Secondary | ICD-10-CM | POA: Diagnosis not present

## 2019-12-05 DIAGNOSIS — I10 Essential (primary) hypertension: Secondary | ICD-10-CM | POA: Insufficient documentation

## 2019-12-05 DIAGNOSIS — Y999 Unspecified external cause status: Secondary | ICD-10-CM | POA: Insufficient documentation

## 2019-12-05 DIAGNOSIS — Z79899 Other long term (current) drug therapy: Secondary | ICD-10-CM | POA: Insufficient documentation

## 2019-12-05 DIAGNOSIS — Y92009 Unspecified place in unspecified non-institutional (private) residence as the place of occurrence of the external cause: Secondary | ICD-10-CM | POA: Insufficient documentation

## 2019-12-05 DIAGNOSIS — R52 Pain, unspecified: Secondary | ICD-10-CM | POA: Diagnosis not present

## 2019-12-05 DIAGNOSIS — Y93E5 Activity, floor mopping and cleaning: Secondary | ICD-10-CM | POA: Insufficient documentation

## 2019-12-05 DIAGNOSIS — I959 Hypotension, unspecified: Secondary | ICD-10-CM | POA: Diagnosis not present

## 2019-12-05 LAB — COMPREHENSIVE METABOLIC PANEL
ALT: 47 U/L — ABNORMAL HIGH (ref 0–44)
AST: 50 U/L — ABNORMAL HIGH (ref 15–41)
Albumin: 4.2 g/dL (ref 3.5–5.0)
Alkaline Phosphatase: 73 U/L (ref 38–126)
Anion gap: 15 (ref 5–15)
BUN: 22 mg/dL (ref 8–23)
CO2: 21 mmol/L — ABNORMAL LOW (ref 22–32)
Calcium: 9.5 mg/dL (ref 8.9–10.3)
Chloride: 110 mmol/L (ref 98–111)
Creatinine, Ser: 0.99 mg/dL (ref 0.44–1.00)
GFR calc Af Amer: >60 mL/min (ref >60)
GFR calc non Af Amer: 59 mL/min — ABNORMAL LOW (ref >60)
Glucose, Bld: 88 mg/dL (ref 70–99)
Potassium: 3.9 mmol/L (ref 3.5–5.1)
Sodium: 146 mmol/L — ABNORMAL HIGH (ref 135–145)
Total Bilirubin: 0.5 mg/dL (ref 0.3–1.2)
Total Protein: 7.2 g/dL (ref 6.5–8.1)

## 2019-12-05 LAB — CBC
HCT: 41.8 % (ref 36.0–46.0)
Hemoglobin: 13.4 g/dL (ref 12.0–15.0)
MCH: 31.5 pg (ref 26.0–34.0)
MCHC: 32.1 g/dL (ref 30.0–36.0)
MCV: 98.4 fL (ref 80.0–100.0)
Platelets: 244 10*3/uL (ref 150–400)
RBC: 4.25 MIL/uL (ref 3.87–5.11)
RDW: 14.1 % (ref 11.5–15.5)
WBC: 5.2 10*3/uL (ref 4.0–10.5)
nRBC: 0 % (ref 0.0–0.2)

## 2019-12-05 LAB — CBG MONITORING, ED: Glucose-Capillary: 63 mg/dL — ABNORMAL LOW (ref 70–99)

## 2019-12-05 LAB — CK: Total CK: 186 U/L (ref 38–234)

## 2019-12-05 MED ORDER — SODIUM CHLORIDE 0.9 % IV BOLUS
1000.0000 mL | Freq: Once | INTRAVENOUS | Status: AC
  Administered 2019-12-05: 16:00:00 1000 mL via INTRAVENOUS

## 2019-12-05 MED ORDER — SODIUM CHLORIDE 0.9% FLUSH: 3.0000 mL | Freq: Once | INTRAVENOUS | Status: DC

## 2019-12-05 NOTE — ED Provider Notes (Signed)
[facility] EMERGENCY DEPARTMENT Provider Note   CSN: [id number] Arrival date & time: 12/05/19  1303     History Chief Complaint  Patient presents with  . Altered Mental Status    Lindsay [name] is a 64 y.o. female.  Pt presents to the ED today with a fall.  Pt has been cleaning out her house and a friend came over to help her.  The friend found pt on the ground trying to get off the floor.  Pt is not sure when she fell.  She thinks around noon, but she is not sure.  Pt c/o pain to her tail bone.  Pt does have a hx of afib, but doesn't take blood thinners due to a hx of a GI bleed.  She is on plavix.  The pt said she's fallen several times recently.  She lives alone with 5 cats.        Past Medical History:  Diagnosis Date  . Anginal pain (HCC)   . Atrial fibrillation (HCC)   . Bipolar disorder (HCC)   . Cataract    OU  . Coronary artery disease involving native coronary artery with angina pectoris (HCC) 03/24/2015  . Dysrhythmia   . GERD (gastroesophageal reflux disease)   . History of appendectomy 1963  . Hypertension   . Hypertensive retinopathy    OU  . Myocardial infarction (HCC)    per pt due to Vit D deficiency  . Vitamin D deficiency     Patient Active Problem List   Diagnosis Date Noted  . PAF (paroxysmal atrial fibrillation) (HCC) 10/09/2018  . Dorsalgia 09/22/2018  . Dysphagia 09/22/2018  . Gastro-esophageal reflux disease without esophagitis 09/22/2018  . Irritable bowel syndrome without diarrhea 09/22/2018  . Major depressive disorder, single episode 09/22/2018  . Menopausal and perimenopausal disorder 09/22/2018  . Migraine without status migrainosus, not intractable 09/22/2018  . Obesity 09/22/2018  . Osteoarthritis of knee 09/22/2018  . Pain in joint 09/22/2018  . Polypharmacy 09/22/2018  . Right upper quadrant pain 09/22/2018  . Sciatica of right side 09/22/2018  . Vitamin D deficiency 09/22/2018  . Mixed hyperlipidemia  05/07/2016  . Alcohol intoxication (HCC) 09/08/2015  . Chronic atrial fibrillation (HCC) 09/08/2015  . Mood disorder (HCC) 09/08/2015  . Unstable angina (HCC) 03/25/2015  . Coronary artery disease involving native coronary artery with angina pectoris (HCC) 03/24/2015  . HTN (hypertension) 03/03/2015  . Chest pain 02/11/2015  . Nausea with vomiting 02/11/2015  . Non-ST elevation MI (NSTEMI) - h/o 07/26/2008    Past Surgical History:  Procedure Laterality Date  . APPENDECTOMY    . CARDIAC CATHETERIZATION N/A 03/25/2015   Procedure: Left Heart Cath and Coronary Angiography;  Surgeon: Marykay Lex, MD;  Location: [facility] INVASIVE CV LAB;  Service: Cardiovascular;  Laterality: N/A;  . CORONARY STENT INTERVENTION N/A 10/09/2018   Procedure: CORONARY STENT INTERVENTION;  Surgeon: Corky Crafts, MD;  Location: [facility] INVASIVE CV LAB;  Service: Cardiovascular;  Laterality: N/A;  . DILATATION & CURETTAGE/HYSTEROSCOPY WITH MYOSURE N/A 05/19/2016   Procedure: DILATATION & CURETTAGE/HYSTEROSCOPY WITH MYOSURE;  Surgeon: Geryl Rankins, MD;  Location: [facility] ORS;  Service: Gynecology;  Laterality: N/A;  Possible Myosure for polyps.  [name] EYE SURGERY    . LEFT HEART CATH AND CORONARY ANGIOGRAPHY N/A 10/09/2018   Procedure: LEFT HEART CATH AND CORONARY ANGIOGRAPHY;  Surgeon: Corky Crafts, MD;  Location: [facility] INVASIVE CV LAB;  Service: Cardiovascular;  Laterality: N/A;  . MOUTH SURGERY    .  URETHRAL DILATION       OB History   No obstetric history on file.     Family History  Problem Relation Age of Onset  . Vascular Disease Mother   . Dementia Mother   . Heart attack Maternal Grandmother   . Heart attack Maternal Grandfather   . Kidney failure Paternal Grandmother   . Heart attack Paternal Grandfather     Social History   Tobacco Use  . Smoking status: Never Smoker  . Smokeless tobacco: Never Used  Substance Use Topics  . Alcohol use: Yes    Alcohol/week: 2.0 standard drinks    Types:  2 Standard drinks or equivalent per week    Comment: rarely  . Drug use: No    Home Medications Prior to Admission medications   Medication Sig Start Date End Date Taking? Authorizing Provider  amLODipine (NORVASC) 2.5 MG tablet Take 1 tablet (2.5 mg total) by mouth daily. Patient taking differently: Take 2.5 mg by mouth at bedtime.  10/11/19  Yes Lennette Bihari, MD  b complex vitamins tablet Take 1 tablet by mouth daily.   Yes [provider]  BIOTIN PO Take 1 tablet by mouth daily.   Yes [provider]  Cholecalciferol (VITAMIN D3) 125 MCG (5000 UT) CAPS Take 5,000 Units by mouth daily.   Yes [provider]  clopidogrel (PLAVIX) 75 MG tablet TAKE ONE TABLET BY MOUTH ONE TIME DAILY Patient taking differently: Take 75 mg by mouth daily.  08/01/19  Yes Bhagat, Bhavinkumar, PA  Coenzyme Q10 (COQ-10 PO) Take 1 capsule by mouth daily.    Yes [provider]  Cyanocobalamin (B-12 PO) Take 1 tablet by mouth 2 (two) times a week.    Yes [provider]  diclofenac sodium (VOLTAREN) 1 % GEL Apply 2 g topically 2 (two) times daily as needed (for pain).    Yes [provider]  esomeprazole (NEXIUM 24HR CLEAR MINIS) 20 MG capsule Take 20 mg by mouth daily before breakfast.   Yes [provider]  hydrOXYzine (VISTARIL) 50 MG capsule Take 50 mg by mouth at bedtime as needed for anxiety (sleep).  03/03/15  Yes [provider]  ibuprofen (ADVIL,MOTRIN) 200 MG tablet Take 200 mg by mouth daily as needed for headache, mild pain or moderate pain.    Yes [provider]  losartan (COZAAR) 100 MG tablet Take 100 mg by mouth daily. 02/19/15  Yes [provider]  metoprolol tartrate (LOPRESSOR) 25 MG tablet TAKE ONE-HALF TABLET BY MOUTH TWICE A DAY Patient taking differently: Take 12.5 mg by mouth 2 (two) times daily. TAKE ONE-HALF TABLET BY MOUTH TWICE A DAY 05/03/19  Yes Chilton Si, MD  nitroGLYCERIN (NITROSTAT) 0.4 MG  SL tablet Place 1 tablet (0.4 mg total) under the tongue as needed for chest pain. 11/08/17  Yes Meng, Wynema Birch, PA  Omega-3 Fatty Acids (OMEGA 3 PO) Take 1 capsule by mouth daily.   Yes [provider]  POTASSIUM PO Take 1 tablet by mouth 2 (two) times a week.   Yes [provider]    Allergies    Bee venom, Lanolin, Sulfa antibiotics, Ciprofloxacin, Depakote [divalproex sodium], Pine, Risperidone, Seroquel [quetiapine fumarate], Statins, Tramadol, Citalopram, Lamotrigine, Other, Topiramate, and Zetia [ezetimibe]  Review of Systems   Review of Systems  Musculoskeletal: Positive for back pain.  All other systems reviewed and are negative.   Physical Exam Updated Vital Signs BP (!) 123/59 (BP Location: Left Arm) Comment: Simultaneous filing. User  may not have seen previous data.  Pulse 86 Comment: Simultaneous filing. User may not have seen previous data.  Temp 97.9 F (36.6 C) (Oral)   Resp 16 Comment: Simultaneous filing. User may not have seen previous data.  SpO2 98% Comment: Simultaneous filing. User may not have seen previous data.  Physical Exam Vitals and nursing note reviewed.  Constitutional:      Appearance: Normal appearance.  HENT:     Head: Normocephalic and atraumatic.     Right Ear: External ear normal.     Left Ear: External ear normal.     Nose: Nose normal.     Mouth/Throat:     Mouth: Mucous membranes are moist.     Pharynx: Oropharynx is clear.  Eyes:     Extraocular Movements: Extraocular movements intact.     Conjunctiva/sclera: Conjunctivae normal.     Pupils: Pupils are equal, round, and reactive to light.  Cardiovascular:     Rate and Rhythm: Normal rate and regular rhythm.     Pulses: Normal pulses.     Heart sounds: Normal heart sounds.  Pulmonary:     Effort: Pulmonary effort is normal.     Breath sounds: Normal breath sounds.  Abdominal:     General: Abdomen is flat. Bowel sounds are normal.     Palpations: Abdomen is soft.    Musculoskeletal:        General: Normal range of motion.     Cervical back: Normal range of motion and neck supple.       Back:  Skin:    General: Skin is warm.     Capillary Refill: Capillary refill takes less than 2 seconds.  Neurological:     General: No focal deficit present.     Mental Status: She is alert and oriented to person, place, and time.  Psychiatric:        Mood and Affect: Mood normal.        Behavior: Behavior normal.        Thought Content: Thought content normal.        Judgment: Judgment normal.     ED Results / Procedures / Treatments   Labs (all labs ordered are listed, but only abnormal results are displayed) Labs Reviewed  COMPREHENSIVE METABOLIC PANEL - Abnormal; Notable for the following components:      Result Value   Sodium 146 (*)    CO2 21 (*)    AST 50 (*)    ALT 47 (*)    GFR calc non Af Amer 59 (*)    All other components within normal limits  CBG MONITORING, ED - Abnormal; Notable for the following components:   Glucose-Capillary 63 (*)    All other components within normal limits  CBC  CK    EKG EKG Interpretation  Date/Time:  Wednesday Dec 05 2019 15:16:53 EDT Ventricular Rate:  73 PR Interval:    QRS Duration: 169 QT Interval:  501 QTC Calculation: 553 R Axis:   59 Text Interpretation: Sinus rhythm Short PR interval Left bundle branch block No significant change since last tracing Confirmed by Isla Pence [phone]) on 12/05/2019 4:08:32 PM Also confirmed by Isla Pence [phone]), editor Jeanine Luz [phone])  on 12/06/2019 9:39:11 AM   Radiology No results found.  Procedures Procedures (including critical care time)  Medications Ordered in ED Medications  sodium chloride 0.9 % bolus 1,000 mL (0 mLs Intravenous Stopped 12/05/19 2000)    ED Course  I have reviewed the  triage vital signs and the nursing notes.  Pertinent labs & imaging results that were available during my care of the patient were reviewed by me and  considered in my medical decision making (see chart for details).  Clinical Course as of Dec 10 1510  Wed Dec 05, 2019  1800 Normal  CK [EW]  1800 Low  CBG monitoring, ED(!) [EW]  1800 Normal except sodium high, CO2 low, AST high, ALT high, GFR low  Comprehensive metabolic panel(!) [EW]  1800 Normal  CBC [EW]  1803 Imaging by CT and radiography, reviewed by radiologist and no fractures or serious injuries were found.  She does have significant degenerative changes of the cervical spine.   [EW]    Clinical Course User Index [EW] Mancel Bale, MD   MDM Rules/Calculators/A&P                      Pt signed out at shift change to Dr. Effie Shy as labs/xrays are pending. Final Clinical Impression(s) / ED Diagnoses Final diagnoses:  Fall, initial encounter    Rx / DC Orders ED Discharge Orders    None       Jacalyn Lefevre, MD 12/10/19 1512

## 2019-12-05 NOTE — ED Triage Notes (Signed)
Pt bib ems with reports of possible fall. Pt found by friend attempting to get off the floor. Pt unable to recall events from last night and today. Pt with pain to tail bone and down left leg. No deformity noted. Pt does take plavix.  117/50 HR 70 99% RA

## 2019-12-05 NOTE — ED Provider Notes (Signed)
4:30 PM-checkout from Dr. Particia Nearing to evaluate labs and imaging, then consider disposition, after ambulation trial.  Patient was found on floor by a friend, at her home.  It is unclear what happened to cause her to be on the floor.  6:05 PM-labs and imaging are reassuring.  Repeat vital signs at 1730 are normal.  Have requested ambulation trial.  At this time the patient is alert and comfortable.  She cannot recall exactly what happened this morning.  She states the last time she fell was between several months and 1 year ago.  She is alert and conversant, and states that she is hungry and would like to eat before and ambulation trial.  8:15 PM Reevaluation with update and discussion. After initial assessment and treatment, an updated evaluation reveals she is calm and comfortable.  She is eating.  She ambulated easily without problems findings discussed with the patient and all questions were answered. Kerrin Mo, MD 12/05/19 2018

## 2019-12-05 NOTE — ED Notes (Signed)
Pt refusing bloodwork at this time

## 2019-12-05 NOTE — ED Notes (Signed)
Patient verbalizes understanding of discharge instructions. Opportunity for questioning and answers were provided. Armband removed by staff, pt discharged from ED ambulatory.   

## 2019-12-05 NOTE — ED Notes (Signed)
Patient ambulated independently with no issue.

## 2019-12-05 NOTE — Discharge Instructions (Addendum)
There were no problems found today from episode which left you on the floor.  He did not appear to have any injuries.  Make sure that you take all of your medicines as usual, and follow-up with your doctor for checkup next week.

## 2019-12-05 NOTE — ED Notes (Signed)
Pt transported to CT ?

## 2019-12-05 NOTE — ED Notes (Signed)
Taken to XRAY

## 2019-12-17 ENCOUNTER — Inpatient Hospital Stay (HOSPITAL_COMMUNITY)
Admission: EM | Admit: 2019-12-17 | Discharge: 2019-12-19 | DRG: 312 | Disposition: A | Discharge status: Home / Self Care | Payer: Medicare Other | Attending: Internal Medicine | Admitting: Internal Medicine

## 2019-12-17 DIAGNOSIS — I2510 Atherosclerotic heart disease of native coronary artery without angina pectoris: Secondary | ICD-10-CM | POA: Diagnosis present

## 2019-12-17 DIAGNOSIS — R002 Palpitations: Secondary | ICD-10-CM | POA: Diagnosis present

## 2019-12-17 DIAGNOSIS — S3992XA Unspecified injury of lower back, initial encounter: Secondary | ICD-10-CM | POA: Diagnosis not present

## 2019-12-17 DIAGNOSIS — I959 Hypotension, unspecified: Secondary | ICD-10-CM | POA: Diagnosis not present

## 2019-12-17 DIAGNOSIS — Z9049 Acquired absence of other specified parts of digestive tract: Secondary | ICD-10-CM

## 2019-12-17 DIAGNOSIS — Z20822 Contact with and (suspected) exposure to covid-19: Secondary | ICD-10-CM | POA: Diagnosis not present

## 2019-12-17 DIAGNOSIS — S199XXA Unspecified injury of neck, initial encounter: Secondary | ICD-10-CM | POA: Diagnosis not present

## 2019-12-17 DIAGNOSIS — N179 Acute kidney failure, unspecified: Secondary | ICD-10-CM | POA: Diagnosis present

## 2019-12-17 DIAGNOSIS — F1021 Alcohol dependence, in remission: Secondary | ICD-10-CM | POA: Diagnosis present

## 2019-12-17 DIAGNOSIS — H35039 Hypertensive retinopathy, unspecified eye: Secondary | ICD-10-CM | POA: Diagnosis present

## 2019-12-17 DIAGNOSIS — Y9301 Activity, walking, marching and hiking: Secondary | ICD-10-CM | POA: Diagnosis present

## 2019-12-17 DIAGNOSIS — Z7902 Long term (current) use of antithrombotics/antiplatelets: Secondary | ICD-10-CM

## 2019-12-17 DIAGNOSIS — Z23 Encounter for immunization: Secondary | ICD-10-CM

## 2019-12-17 DIAGNOSIS — Z9119 Patient's noncompliance with other medical treatment and regimen: Secondary | ICD-10-CM

## 2019-12-17 DIAGNOSIS — Z882 Allergy status to sulfonamides status: Secondary | ICD-10-CM

## 2019-12-17 DIAGNOSIS — R32 Unspecified urinary incontinence: Secondary | ICD-10-CM | POA: Diagnosis present

## 2019-12-17 DIAGNOSIS — S0181XA Laceration without foreign body of other part of head, initial encounter: Secondary | ICD-10-CM | POA: Diagnosis not present

## 2019-12-17 DIAGNOSIS — R55 Syncope and collapse: Principal | ICD-10-CM

## 2019-12-17 DIAGNOSIS — I25110 Atherosclerotic heart disease of native coronary artery with unstable angina pectoris: Secondary | ICD-10-CM | POA: Diagnosis not present

## 2019-12-17 DIAGNOSIS — E559 Vitamin D deficiency, unspecified: Secondary | ICD-10-CM | POA: Diagnosis present

## 2019-12-17 DIAGNOSIS — M533 Sacrococcygeal disorders, not elsewhere classified: Secondary | ICD-10-CM | POA: Diagnosis present

## 2019-12-17 DIAGNOSIS — E782 Mixed hyperlipidemia: Secondary | ICD-10-CM | POA: Diagnosis present

## 2019-12-17 DIAGNOSIS — W109XXA Fall (on) (from) unspecified stairs and steps, initial encounter: Secondary | ICD-10-CM | POA: Diagnosis present

## 2019-12-17 DIAGNOSIS — Z881 Allergy status to other antibiotic agents status: Secondary | ICD-10-CM

## 2019-12-17 DIAGNOSIS — N390 Urinary tract infection, site not specified: Secondary | ICD-10-CM | POA: Diagnosis not present

## 2019-12-17 DIAGNOSIS — G4733 Obstructive sleep apnea (adult) (pediatric): Secondary | ICD-10-CM | POA: Diagnosis present

## 2019-12-17 DIAGNOSIS — Z888 Allergy status to other drugs, medicaments and biological substances status: Secondary | ICD-10-CM

## 2019-12-17 DIAGNOSIS — K920 Hematemesis: Secondary | ICD-10-CM | POA: Diagnosis present

## 2019-12-17 DIAGNOSIS — E860 Dehydration: Secondary | ICD-10-CM | POA: Diagnosis present

## 2019-12-17 DIAGNOSIS — R410 Disorientation, unspecified: Secondary | ICD-10-CM | POA: Diagnosis not present

## 2019-12-17 DIAGNOSIS — S0101XA Laceration without foreign body of scalp, initial encounter: Secondary | ICD-10-CM | POA: Diagnosis not present

## 2019-12-17 DIAGNOSIS — R7402 Elevation of levels of lactic acid dehydrogenase (LDH): Secondary | ICD-10-CM | POA: Diagnosis not present

## 2019-12-17 DIAGNOSIS — R079 Chest pain, unspecified: Secondary | ICD-10-CM

## 2019-12-17 DIAGNOSIS — R52 Pain, unspecified: Secondary | ICD-10-CM | POA: Diagnosis not present

## 2019-12-17 DIAGNOSIS — I951 Orthostatic hypotension: Secondary | ICD-10-CM

## 2019-12-17 DIAGNOSIS — K219 Gastro-esophageal reflux disease without esophagitis: Secondary | ICD-10-CM | POA: Diagnosis present

## 2019-12-17 DIAGNOSIS — E872 Acidosis, unspecified: Secondary | ICD-10-CM | POA: Diagnosis present

## 2019-12-17 DIAGNOSIS — R404 Transient alteration of awareness: Secondary | ICD-10-CM | POA: Diagnosis not present

## 2019-12-17 DIAGNOSIS — Z885 Allergy status to narcotic agent status: Secondary | ICD-10-CM

## 2019-12-17 DIAGNOSIS — R102 Pelvic and perineal pain: Secondary | ICD-10-CM | POA: Diagnosis not present

## 2019-12-17 DIAGNOSIS — M25551 Pain in right hip: Secondary | ICD-10-CM | POA: Diagnosis not present

## 2019-12-17 DIAGNOSIS — I252 Old myocardial infarction: Secondary | ICD-10-CM

## 2019-12-17 DIAGNOSIS — Z91030 Bee allergy status: Secondary | ICD-10-CM

## 2019-12-17 DIAGNOSIS — I10 Essential (primary) hypertension: Secondary | ICD-10-CM | POA: Diagnosis present

## 2019-12-17 DIAGNOSIS — Z8249 Family history of ischemic heart disease and other diseases of the circulatory system: Secondary | ICD-10-CM

## 2019-12-17 DIAGNOSIS — I480 Paroxysmal atrial fibrillation: Secondary | ICD-10-CM | POA: Diagnosis present

## 2019-12-17 DIAGNOSIS — S3993XA Unspecified injury of pelvis, initial encounter: Secondary | ICD-10-CM | POA: Diagnosis not present

## 2019-12-17 DIAGNOSIS — R7989 Other specified abnormal findings of blood chemistry: Secondary | ICD-10-CM

## 2019-12-17 DIAGNOSIS — M79651 Pain in right thigh: Secondary | ICD-10-CM | POA: Diagnosis not present

## 2019-12-17 DIAGNOSIS — I447 Left bundle-branch block, unspecified: Secondary | ICD-10-CM | POA: Diagnosis present

## 2019-12-17 DIAGNOSIS — F319 Bipolar disorder, unspecified: Secondary | ICD-10-CM | POA: Diagnosis present

## 2019-12-17 DIAGNOSIS — Z9861 Coronary angioplasty status: Secondary | ICD-10-CM

## 2019-12-17 DIAGNOSIS — Z79899 Other long term (current) drug therapy: Secondary | ICD-10-CM

## 2019-12-17 DIAGNOSIS — Z9114 Patient's other noncompliance with medication regimen: Secondary | ICD-10-CM

## 2019-12-17 MED ORDER — TETANUS-DIPHTH-ACELL PERTUSSIS 5-2.5-18.5 LF-MCG/0.5 IM SUSP
0.5000 mL | Freq: Once | INTRAMUSCULAR | Status: AC
  Administered 2019-12-18: 0.5 mL via INTRAMUSCULAR
  Filled 2019-12-17: qty 0.5

## 2019-12-17 MED ORDER — SODIUM CHLORIDE 0.9 % IV BOLUS
1000.0000 mL | Freq: Once | INTRAVENOUS | Status: AC
  Administered 2019-12-17: 1000 mL via INTRAVENOUS

## 2019-12-17 NOTE — ED Triage Notes (Signed)
Per EMS, pt from home alone, does not remember fall, laceration to the back of her head.  Pt this happened two weeks ago as well.  Pt in/out of consciousness at times w/ snoring respirations.  18 Left AC.  Pt on Plavix

## 2019-12-17 NOTE — ED Provider Notes (Addendum)
[facility] EMERGENCY DEPARTMENT Provider Note   CSN: [id number] Arrival date & time: 12/17/19  2255    History Syncope  Lindsay [name] is a 64 y.o. female with past medical history significant for A. Fib, MI, GI bleed, bipolar who presents for evaluation of fall.  Patient states she is unsure if she had a syncopal event.  She does not remember the fall.  She was walking down the stairs it was not approximately 6-7 steps from the bottom when she remembers waking up at the bottom of the staircase.  States she has had recurrent chest pain over the last year however worse over the last month.  She was seen here in the ED approximately 10 days ago with fall/syncope.  She was discharged home.  In triage patient had additional syncopal event and was noted to be hypotensive with systolic into the 62M.  Unknown last tetanus however is laceration to left posterior scalp.  States she does have some sacral pain which she has had since her previous fall as well as right gluteal pain.  She denies any sudden onset thunderclap headache, lightheadedness, dizziness, hemoptysis, extremity pain, abdominal pain, dysuria, diarrhea, melena, BRBPR.  No unilateral leg swelling, redness, warmth, facial droop.  Denies additional aggravating or alleviating factors.  Lives by self.  Fall unwitnessed.  Personally called EMS.  Per nursing no post ictal or seizure-like activity during syncopal episodes.  Tongue biting or bladder incontinence.  She does admit to occasional alcohol use however denies chronic alcohol use, DTs or withdrawal seizures.  Patient is not on any additional anticoagulation aside from Plavix due to history of GI bleeds.  History obtained from patient and past medical records. No interpretor was used.  HPI     Past Medical History:  Diagnosis Date  . Anginal pain ([place])   . Atrial fibrillation ([place])   . Bipolar disorder ([place])   . Cataract    OU  . Coronary artery disease involving  native coronary artery with angina pectoris ([place]) 03/24/2015  . Dysrhythmia   . GERD (gastroesophageal reflux disease)   . History of appendectomy 1963  . Hypertension   . Hypertensive retinopathy    OU  . Myocardial infarction ([place])    per pt due to Vit D deficiency  . Vitamin D deficiency     Patient Active Problem List   Diagnosis Date Noted  . Coronary artery disease involving native coronary artery of native heart 12/18/2019  . Syncope 12/18/2019  . Lactic acidosis 12/18/2019  . PAF (paroxysmal atrial fibrillation) ([place]) 10/09/2018  . Dorsalgia 09/22/2018  . Dysphagia 09/22/2018  . GERD without esophagitis 09/22/2018  . Irritable bowel syndrome without diarrhea 09/22/2018  . Major depressive disorder, single episode 09/22/2018  . Menopausal and perimenopausal disorder 09/22/2018  . Migraine without status migrainosus, not intractable 09/22/2018  . Obesity 09/22/2018  . Osteoarthritis of knee 09/22/2018  . Polypharmacy 09/22/2018  . Sciatica of right side 09/22/2018  . Vitamin D deficiency 09/22/2018  . Mixed hyperlipidemia 05/07/2016  . Alcohol intoxication ([place]) 09/08/2015  . Chronic atrial fibrillation ([place]) 09/08/2015  . Mood disorder ([place]) 09/08/2015  . Coronary artery disease involving native coronary artery with angina pectoris ([place]) 03/24/2015  . Essential hypertension 03/03/2015  . Non-ST elevation MI (NSTEMI) - h/o 07/26/2008    Past Surgical History:  Procedure Laterality Date  . APPENDECTOMY    . CARDIAC CATHETERIZATION N/A 03/25/2015   Procedure: Left Heart Cath and Coronary Angiography;  Surgeon: Leonie Man,  MD;  Location: [place] [facility];  Service: Cardiovascular;  Laterality: N/A;  . CORONARY STENT INTERVENTION N/A 10/09/2018   Procedure: CORONARY STENT INTERVENTION;  Surgeon: Jettie Booze, MD;  Location: [place] [facility];  Service: Cardiovascular;  Laterality: N/A;  . DILATATION & CURETTAGE/HYSTEROSCOPY WITH MYOSURE N/A 05/19/2016    Procedure: DILATATION & CURETTAGE/HYSTEROSCOPY WITH MYOSURE;  Surgeon: Thurnell Lose, MD;  Location: [facility];  Service: Gynecology;  Laterality: N/A;  Possible Myosure for polyps.  [name] EYE SURGERY    . LEFT HEART CATH AND CORONARY ANGIOGRAPHY N/A 10/09/2018   Procedure: LEFT HEART CATH AND CORONARY ANGIOGRAPHY;  Surgeon: Jettie Booze, MD;  Location: [place] [facility];  Service: Cardiovascular;  Laterality: N/A;  . MOUTH SURGERY    . URETHRAL DILATION       OB History   No obstetric history on file.     Family History  Problem Relation Age of Onset  . Vascular Disease Mother   . Dementia Mother   . Heart attack Maternal Grandmother   . Heart attack Maternal Grandfather   . Kidney failure Paternal Grandmother   . Heart attack Paternal Grandfather     Social History   Tobacco Use  . Smoking status: Never Smoker  . Smokeless tobacco: Never Used  Substance Use Topics  . Alcohol use: Yes    Alcohol/week: 2.0 standard drinks    Types: 2 Standard drinks or equivalent per week    Comment: rarely  . Drug use: No    Home Medications Prior to Admission medications   Medication Sig Start Date End Date Taking? Authorizing Provider  amLODipine (NORVASC) 2.5 MG tablet Take 1 tablet (2.5 mg total) by mouth daily. Patient taking differently: Take 2.5 mg by mouth at bedtime.  10/11/19  Yes Troy Sine, MD  b complex vitamins tablet Take 1 tablet by mouth daily.   Yes [provider]  BIOTIN PO Take 1 tablet by mouth daily.   Yes [provider]  Cholecalciferol (VITAMIN D3) 125 MCG (5000 UT) CAPS Take 5,000 Units by mouth daily.   Yes [provider]  clopidogrel (PLAVIX) 75 MG tablet TAKE ONE TABLET BY MOUTH ONE TIME DAILY Patient taking differently: Take 75 mg by mouth daily.  08/01/19  Yes Bhagat, Bhavinkumar, PA  Coenzyme Q10 (COQ-10 PO) Take 1 capsule by mouth daily.    Yes [provider]  Cyanocobalamin (B-12 PO) Take 1 tablet by mouth 2  (two) times a week.    Yes [provider]  esomeprazole (NEXIUM 24HR CLEAR MINIS) 20 MG capsule Take 20 mg by mouth daily before breakfast.   Yes [provider]  hydrOXYzine (VISTARIL) 50 MG capsule Take 50 mg by mouth at bedtime as needed for anxiety (sleep).  03/03/15  Yes [provider]  ibuprofen (ADVIL,MOTRIN) 200 MG tablet Take 200 mg by mouth daily as needed for headache, mild pain or moderate pain.    Yes [provider]  losartan (COZAAR) 100 MG tablet Take 100 mg by mouth daily. 02/19/15  Yes [provider]  metoprolol tartrate (LOPRESSOR) 25 MG tablet TAKE ONE-HALF TABLET BY MOUTH TWICE A DAY Patient taking differently: Take 12.5 mg by mouth 2 (two) times daily. TAKE ONE-HALF TABLET BY MOUTH TWICE A DAY 05/03/19  Yes Skeet Latch, MD  Omega-3 Fatty Acids (OMEGA 3 PO) Take 1 capsule by mouth daily.   Yes [provider]  POTASSIUM PO Take 1 tablet by mouth 2 (two) times  a week.   Yes [provider]  nitroGLYCERIN (NITROSTAT) 0.4 MG SL tablet Place 1 tablet (0.4 mg total) under the tongue as needed for chest pain. 11/08/17   Almyra Deforest, PA    Allergies    Bee venom, Lanolin, Sulfa antibiotics, Ciprofloxacin, Depakote [divalproex sodium], Pine, Risperidone, Seroquel [quetiapine fumarate], Statins, Tramadol, Citalopram, Lamotrigine, Other, Topiramate, and Zetia [ezetimibe]  Review of Systems   Review of Systems  Constitutional: Negative.   HENT: Negative.   Eyes: Negative.   Respiratory: Negative.   Cardiovascular: Positive for chest pain. Negative for palpitations and leg swelling.  Gastrointestinal: Negative.   Genitourinary: Negative.   Musculoskeletal: Positive for back pain. Negative for neck pain and neck stiffness.  Skin: Positive for wound.  Neurological: Positive for syncope. Negative for dizziness, tremors, seizures, speech difficulty, weakness, light-headedness, numbness and headaches.  All other systems  reviewed and are negative.  Physical Exam Updated Vital Signs BP 124/69   Pulse 89   Temp 98.7 F (37.1 C) (Oral)   Resp 14   SpO2 100%   Physical Exam Vitals and nursing note reviewed.  Constitutional:      General: She is not in acute distress.    Appearance: She is well-developed. She is not toxic-appearing or diaphoretic.  HENT:     Head: Normocephalic.     Jaw: There is normal jaw occlusion.     Comments: 1.5 centimeters posterior left scalp laceration.  No active bleeding or drainage.    Right Ear: No hemotympanum.     Left Ear: No hemotympanum.     Nose: No nasal deformity, septal deviation, signs of injury, laceration, nasal tenderness, congestion or rhinorrhea.     Right Nostril: No foreign body, epistaxis, septal hematoma or occlusion.     Left Nostril: No foreign body, epistaxis, septal hematoma or occlusion.     Right Sinus: No maxillary sinus tenderness or frontal sinus tenderness.     Left Sinus: No maxillary sinus tenderness or frontal sinus tenderness.     Comments: Dried blood to left external nares.  No septal hematoma.  No facial bone tenderness, crepitus or step-offs    Mouth/Throat:     Lips: Pink.     Mouth: Mucous membranes are dry.     Palate: No mass and lesions.     Pharynx: Oropharynx is clear. Uvula midline.     Comments: Tongue midline.  Dentition intact.  No evidence of intraoral trauma Eyes:     Extraocular Movements: Extraocular movements intact.     Conjunctiva/sclera: Conjunctivae normal.     Pupils: Pupils are equal, round, and reactive to light.     Comments: Pupils equal reactive to light.  No nystagmus.  No photophobia  Neck:     Trachea: Trachea and phonation normal.     Comments: Declined c-collar.  Admits to chronic midline neck pain.  No crepitus or step-offs Cardiovascular:     Rate and Rhythm: Normal rate.     Pulses: Normal pulses.          Radial pulses are 2+ on the right side and 2+ on the left side.       Dorsalis pedis  pulses are 2+ on the right side and 2+ on the left side.       Posterior tibial pulses are 2+ on the right side and 2+ on the left side.     Heart sounds: Normal heart sounds.  Pulmonary:     Effort: Pulmonary effort is normal. No  respiratory distress.     Breath sounds: Normal breath sounds and air entry.     Comments: Speaks in full sentences without difficulty.  No wheeze, rhonchi or rales Chest:     Comments: No chest wall tenderness, crepitus or step-offs Abdominal:     General: Bowel sounds are normal. There is no distension.     Palpations: Abdomen is soft.     Tenderness: There is no abdominal tenderness. There is no guarding or rebound.     Hernia: No hernia is present.     Comments: Soft, nontender without rebound or guarding.  No overlying skin changes.  No midline pulsatile abdominal mass  Musculoskeletal:        General: Normal range of motion.     Right shoulder: Normal.     Right elbow: Normal.     Left elbow: Normal.     Right wrist: Normal.     Left wrist: Normal.     Cervical back: Full passive range of motion without pain, normal range of motion and neck supple. Tenderness present.     Thoracic back: Normal.     Lumbar back: Tenderness present. No swelling, edema, deformity, signs of trauma, lacerations or bony tenderness. Normal range of motion. Negative right straight leg raise test and negative left straight leg raise test.       Back:     Right hip: Normal.     Left hip: Normal.     Right knee: Normal.     Left knee: Normal.     Right lower leg: Normal.     Left lower leg: Normal.     Right ankle: Normal.     Left ankle: Normal.     Comments: Moves all 4 extremities without difficulty.  Tenderness to right lumbar paraspinal region and into sacral region.  No overlying skin changes.  Shortening or rotation of legs.  Able to straight leg raise without difficulty.  Skin:    General: Skin is warm and dry.     Coloration: Skin is pale.     Comments: 1.5 meter  laceration to left posterior scalp.  No bleeding or drainage.  Patient does appear pale.  Neurological:     Mental Status: She is lethargic.     Cranial Nerves: Cranial nerves are intact.     Sensory: Sensation is intact.     Motor: Motor function is intact.     Coordination: Coordination is intact.     Gait: Gait is intact.     Comments: CN 2-12 grossly intact.  Negative finger-nose, Romberg, heel-to-shin.  Lethargic during initial part of presentation however became more arousable, speaks in full sentences without difficulty    ED Results / Procedures / Treatments   Labs (all labs ordered are listed, but only abnormal results are displayed) Labs Reviewed  COMPREHENSIVE METABOLIC PANEL - Abnormal; Notable for the following components:      Result Value   CO2 21 (*)    Creatinine, Ser 1.47 (*)    AST 63 (*)    ALT 58 (*)    GFR calc non Af Amer 37 (*)    GFR calc Af Amer 43 (*)    All other components within normal limits  LACTIC ACID, PLASMA - Abnormal; Notable for the following components:   Lactic Acid, Venous 3.0 (*)    All other components within normal limits  LACTIC ACID, PLASMA - Abnormal; Notable for the following components:   Lactic Acid, Venous 2.6 (*)  All other components within normal limits  D-DIMER, QUANTITATIVE (NOT AT [facility]) - Abnormal; Notable for the following components:   D-Dimer, Quant 1.63 (*)    All other components within normal limits  CULTURE, BLOOD (ROUTINE X 2)  CULTURE, BLOOD (ROUTINE X 2)  SARS CORONAVIRUS 2 BY RT PCR (HOSPITAL ORDER, [facility])  CBC WITH DIFFERENTIAL/PLATELET  URINALYSIS, ROUTINE W REFLEX MICROSCOPIC  BRAIN NATRIURETIC PEPTIDE  TROPONIN I (HIGH SENSITIVITY)  TROPONIN I (HIGH SENSITIVITY)    EKG EKG Interpretation  Date/Time:  Monday Dec 17 2019 23:28:57 EDT Ventricular Rate:  102 PR Interval:    QRS Duration: 156 QT Interval:  417 QTC Calculation: 544 R Axis:   8 Text  Interpretation: Sinus tachycardia Left bundle branch block No significant change since last tracing Confirmed by Addison Lank [phone]) on 12/17/2019 11:47:30 PM   Radiology DG Lumbar Spine Complete  Result Date: 12/18/2019 CLINICAL DATA:  Syncope, fall. EXAM: LUMBAR SPINE - COMPLETE 4+ VIEW COMPARISON:  Lumbar radiograph 12/05/2019 FINDINGS: Mild broad-based levo scoliotic curvature which may be positional. Previous anterolisthesis of L4 on L5 is no longer seen. No acute fracture. Vertebral body heights are preserved. Mild multilevel degenerative disc disease most prominent at L2-L3. There is prominent facet hypertrophy at multiple levels. Sacroiliac joints are congruent. Stable coarse calcification in the left pelvis, presumed fibroid. IMPRESSION: 1. No acute fracture of the lumbar spine. 2. Multilevel degenerative change, grossly stable from prior. Electronically Signed   By: Keith Rake M.D.   On: 12/18/2019 00:57   DG Pelvis 1-2 Views  Result Date: 12/18/2019 CLINICAL DATA:  Post fall.  Syncope.  Right hip pain. EXAM: PELVIS - 1-2 VIEW COMPARISON:  Pelvis radiograph 12/05/2019 FINDINGS: The cortical margins of the bony pelvis are intact. No fracture. Pubic symphysis and sacroiliac joints are congruent. Coarse calcification again seen in the left pelvis. Both femoral heads are well-seated in the respective acetabula. IMPRESSION: No pelvic fracture. Electronically Signed   By: Keith Rake M.D.   On: 12/18/2019 00:58   DG Sacrum/Coccyx  Result Date: 12/18/2019 CLINICAL DATA:  Syncope, pelvic pain. EXAM: SACRUM AND COCCYX - 2+ VIEW COMPARISON:  Sacrum radiograph 12/05/2019 FINDINGS: Cortical irregularity involving the sacrococcygeal junction that was not seen on prior exam and suspicious for nondisplaced fracture. The sacral ala are maintained. The sacroiliac joints are congruent. Coarse calcification in the left pelvis again seen. IMPRESSION: Cortical irregularity involving the  sacrococcygeal junction, not seen on recent prior exam and suspicious for nondisplaced fracture. Electronically Signed   By: Keith Rake M.D.   On: 12/18/2019 00:59   CT Head Wo Contrast  Result Date: 12/18/2019 CLINICAL DATA:  Syncope, recurrent. Fall at home. Loss of consciousness. Laceration to back of head. EXAM: CT HEAD WITHOUT CONTRAST TECHNIQUE: Contiguous axial images were obtained from the base of the skull through the vertex without intravenous contrast. COMPARISON:  Head CT 12/05/2019 FINDINGS: Brain: No intracranial hemorrhage, mass effect, or midline shift. Age related atrophy. No hydrocephalus. The basilar cisterns are patent. No evidence of territorial infarct or acute ischemia. Minor chronic small vessel ischemia. No extra-axial or intracranial fluid collection. Vascular: Atherosclerosis of skullbase vasculature without hyperdense vessel or abnormal calcification. Skull: No fracture or focal lesion. Sinuses/Orbits: Paranasal sinuses and mastoid air cells are clear. The visualized orbits are unremarkable. Other: None. IMPRESSION: 1. No acute intracranial abnormality. No skull fracture. 2. Age related atrophy and minimal chronic small vessel ischemia. Electronically Signed   By: Aurther Loft.D.  On: 12/18/2019 01:10   CT Angio Chest PE W/Cm &/Or Wo Cm  Result Date: 12/18/2019 CLINICAL DATA:  Recurrent syncope EXAM: CT ANGIOGRAPHY CHEST WITH CONTRAST TECHNIQUE: Multidetector CT imaging of the chest was performed using the standard protocol during bolus administration of intravenous contrast. Multiplanar CT image reconstructions and MIPs were obtained to evaluate the vascular anatomy. CONTRAST:  73m OMNIPAQUE IOHEXOL 350 MG/ML SOLN COMPARISON:  None. FINDINGS: Cardiovascular: Contrast injection is sufficient to demonstrate satisfactory opacification of the pulmonary arteries to the segmental level. There is no pulmonary embolus or evidence of right heart strain. The size of the main  pulmonary artery is normal. Heart size is normal, with no pericardial effusion. The course and caliber of the aorta are normal. There is no atherosclerotic calcification. Opacification decreased due to pulmonary arterial phase contrast bolus timing. Mediastinum/Nodes: No mediastinal, hilar or axillary lymphadenopathy. Normal visualized thyroid. Small hiatal hernia. Lungs/Pleura: Airways are patent. No pleural effusion, lobar consolidation, pneumothorax or pulmonary infarction. Upper Abdomen: Contrast bolus timing is not optimized for evaluation of the abdominal organs. The visualized portions of the organs of the upper abdomen are normal. Musculoskeletal: No chest wall abnormality. No bony spinal canal stenosis. Review of the MIP images confirms the above findings. IMPRESSION: 1. No pulmonary embolus or other acute thoracic abnormality. 2. Small hiatal hernia. Electronically Signed   By: KUlyses JarredM.D.   On: 12/18/2019 02:46   CT Cervical Spine Wo Contrast  Result Date: 12/18/2019 CLINICAL DATA:  Syncope, fall at home. Positive loss of consciousness. Laceration to back of head. EXAM: CT CERVICAL SPINE WITHOUT CONTRAST TECHNIQUE: Multidetector CT imaging of the cervical spine was performed without intravenous contrast. Multiplanar CT image reconstructions were also generated. COMPARISON:  CT 12/05/2019 FINDINGS: Alignment: Straightening of normal lordosis. No traumatic subluxation. Skull base and vertebrae: No acute fracture. Vertebral body heights are maintained. The dens and skull base are intact. Soft tissues and spinal canal: No prevertebral fluid or swelling. No visible canal hematoma. Disc levels: Unchanged degenerative disc disease and facet hypertrophy. Disc space narrowing and endplate spurring most prominent at C5-C6. Upper chest: No acute findings. Other: Mild carotid calcifications. IMPRESSION: 1. No acute fracture or subluxation of the cervical spine. 2. Unchanged degenerative disc disease and  facet hypertrophy. Electronically Signed   By: MKeith RakeM.D.   On: 12/18/2019 01:14   DG Chest Portable 1 View  Result Date: 12/18/2019 CLINICAL DATA:  Syncope. EXAM: PORTABLE CHEST 1 VIEW COMPARISON:  Radiograph 12/05/2019 FINDINGS: Upper normal heart size with unchanged mediastinal contours. Lower lung volumes from prior exam. No pneumothorax, large pleural effusion, focal airspace disease or pulmonary edema. No acute osseous abnormalities are seen. IMPRESSION: Lower lung volumes from prior exam without acute abnormality. Electronically Signed   By: MKeith RakeM.D.   On: 12/18/2019 00:55   DG Femur Min 2 Views Right  Result Date: 12/18/2019 CLINICAL DATA:  Syncope. Right femur pain. EXAM: RIGHT FEMUR 2 VIEWS COMPARISON:  None. FINDINGS: Cortical margins of the femur are intact. There is no evidence of fracture or other focal bone lesions. Soft tissues are unremarkable. Mild degenerative change of the hip and knee. IMPRESSION: No fracture or acute abnormality of the right femur. Electronically Signed   By: MKeith RakeM.D.   On: 12/18/2019 00:54    Procedures ..Marland Kitchenaceration Repair  Date/Time: 12/18/2019 2:10 AM Performed by: HNettie Elm PA-C Authorized by: HNettie Elm PA-C   Consent:    Consent obtained:  Verbal   Consent  given by:  Patient   Risks discussed:  Infection, need for additional repair, pain, poor cosmetic result and poor wound healing   Alternatives discussed:  No treatment and delayed treatment Universal protocol:    Procedure explained and questions answered to patient or proxy's satisfaction: yes     Relevant documents present and verified: yes     Test results available and properly labeled: yes     Imaging studies available: yes     Required blood products, implants, devices, and special equipment available: yes     Site/side marked: yes     Immediately prior to procedure, a time out was called: yes     Patient identity confirmed:   Verbally with patient Anesthesia (see MAR for exact dosages):    Anesthesia method:  None Laceration details:    Location:  Scalp   Scalp location:  L parietal   Length (cm):  1.5   Depth (mm):  3 Repair type:    Repair type:  Intermediate Pre-procedure details:    Preparation:  Patient was prepped and draped in usual sterile fashion and imaging obtained to evaluate for foreign bodies Exploration:    Hemostasis achieved with:  Direct pressure   Contaminated: no   Treatment:    Area cleansed with:  Betadine   Amount of cleaning:  Extensive   Irrigation method:  Pressure wash Skin repair:    Repair method:  Staples   Number of staples:  2 Approximation:    Approximation:  Close Post-procedure details:    Dressing:  Open (no dressing)   Patient tolerance of procedure:  Tolerated well, no immediate complications  .Critical Care Performed by: Nettie Elm, PA-C Authorized by: Nettie Elm, PA-C   Critical care provider statement:    Critical care time (minutes):  45   Critical care was necessary to treat or prevent imminent or life-threatening deterioration of the following conditions:  Circulatory failure   Critical care was time spent personally by me on the following activities:  Discussions with consultants, evaluation of patient's response to treatment, examination of patient, ordering and performing treatments and interventions, ordering and review of laboratory studies, ordering and review of radiographic studies, pulse oximetry, re-evaluation of patient's condition, obtaining history from patient or surrogate and review of old charts   (including critical care time)  Medications Ordered in ED Medications  Tdap (BOOSTRIX) injection 0.5 mL (has no administration in time range)  sodium chloride 0.9 % bolus 1,000 mL (0 mLs Intravenous Stopped 12/18/19 0252)  sodium chloride 0.9 % bolus 1,000 mL (0 mLs Intravenous Stopped 12/18/19 0133)  iohexol (OMNIPAQUE) 350  MG/ML injection 75 mL (75 mLs Intravenous Contrast Given 12/18/19 0218)   ED Course  I have reviewed the triage vital signs and the nursing notes.  Pertinent labs & imaging results that were available during my care of the patient were reviewed by me and considered in my medical decision making (see chart for details).  64 year old female presents for evaluation of syncope.  Afebrile, nonseptic appearing. Had 2 syncopal episodes approximately 10 days ago.  Recurrent syncope x2 today.  1 witnessed in ED.  For syncope today was down approximately 6 steps.  She has 1.5 cm laceration to left parietal scalp.  Staples placed.  Was hypotensive with syncopal episode in triage.  She has had recurrent chest pain x1 year however worse over the last month.  No recent surgery, mobilization, malignancy, unilateral leg swelling, redness or warmth.  She is on Plavix  for prior stents and history of A. fib however no additional anticoagulation due to history of GI bleeds.  Patient with nonfocal neuro exam without deficits.  Tactile temperature to extremities.  Tenderness to distal sacral region without overlying skin changes.  Patient with severe hypotension with systolic into the 44R.  Patient does not appear septic.  Will hold on antibiotics given no infectious symptoms.  No postictal period, no seizure-like activity with syncopal episode witnessed by nursing staff here. Will plan on labs, imaging and reassess.  Labs and imaging personally reviewed and interpreted:  Clinical Course as of Dec 18 326  Tue Dec 18, 2019  0249 No PE or acute chest findings  CT Angio Chest PE W/Cm &/Or Wo Cm [BH]  0250 Negative for acute findings  CT Cervical Spine Wo Contrast [BH]  0250 No acute findings  CT Head Wo Contrast [BH]  0250 No acute fracture  DG Pelvis 1-2 Views [BH]  0251 IMPRESSION: 1. No acute fracture of the lumbar spine. 2. Multilevel degenerative change, grossly stable from prior.  DG Lumbar Spine Complete [BH]   0251 IMPRESSION: Cortical irregularity involving the sacrococcygeal junction, not seen on recent prior exam and suspicious for nondisplaced fracture.  DG Sacrum/Coccyx [BH]  1540 IMPRESSION: Lower lung volumes from prior exam without acute abnormality.    DG Chest Portable 1 View [BH]  0251 No acute fracture  DG Femur Min 2 Views Right [BH]  0251 No leukocytosis, hemoglobin 12.9  CBC with Differential [BH]  0252 Creatinine 1.47, mildly elevated LFTs however normal T bili alk phos  Comprehensive metabolic panel(!) [BH]  0867 Elevated  D-dimer, quantitative (not at [facility])(!) [BH]  0252 3.0  Lactic acid, plasma(!!) [BH]  0252 4  Troponin I (High Sensitivity) [BH]    Clinical Course User Index [BH] Henderly, Britni A, PA-C   0200: Patient reassessed.  Blood pressures have improved to 619'J systolic.  Patient is high risk given recurrent syncope.  Will admit for inpatient further work-up.  Suspect some degree of dehydration as cause of her hypotension as she does look dehydrated however certainly is further work-up for her recurrent syncope.  I have continue to have low suspicion for sepsis and think her lactic acid elevation is likely due to dehydration.  She has no leukocytosis and is without infectious symptoms. Head Lac repaired.  See procedure note.  Normal mentation.  0300: CONSULT with Dr. Cyd Silence with [facility] who agrees to evaluation patient for admission.  Patient seen and evaluated by attending Dr.Cardema who agrees with above treatment, plan and disposition.  The patient appears reasonably stabilized for admission considering the current resources, flow, and capabilities available in the ED at this time, and I doubt any other [facility] requiring further screening and/or treatment in the ED prior to admission. MDM Rules/Calculators/A&P     CHA2DS2-VASc Score: 4                Hx of recurrent GI bleeds-- Cards previously discussed risk outweigh benefit>> Patient did not want to start  on anticoagulation Final Clinical Impression(s) / ED Diagnoses Final diagnoses:  Recurrent syncope  Elevated lactic acid level  Laceration of scalp without foreign body, initial encounter  Hypotension, unspecified hypotension type    Rx / DC Orders ED Discharge Orders    None       Henderly, Britni A, PA-C 12/18/19 0309    Henderly, Britni A, PA-C 12/18/19 0329    Fatima Blank, MD 12/18/19 [phone]

## 2019-12-18 ENCOUNTER — Observation Stay (HOSPITAL_COMMUNITY): Payer: Medicare Other

## 2019-12-18 ENCOUNTER — Encounter (HOSPITAL_COMMUNITY): Payer: Self-pay | Admitting: Radiology

## 2019-12-18 ENCOUNTER — Emergency Department (HOSPITAL_COMMUNITY): Payer: Medicare Other

## 2019-12-18 ENCOUNTER — Other Ambulatory Visit (HOSPITAL_COMMUNITY): Payer: Medicare Other

## 2019-12-18 ENCOUNTER — Other Ambulatory Visit: Payer: Self-pay

## 2019-12-18 DIAGNOSIS — R079 Chest pain, unspecified: Secondary | ICD-10-CM | POA: Diagnosis not present

## 2019-12-18 DIAGNOSIS — R55 Syncope and collapse: Secondary | ICD-10-CM | POA: Diagnosis not present

## 2019-12-18 DIAGNOSIS — Z9049 Acquired absence of other specified parts of digestive tract: Secondary | ICD-10-CM | POA: Diagnosis not present

## 2019-12-18 DIAGNOSIS — F319 Bipolar disorder, unspecified: Secondary | ICD-10-CM | POA: Diagnosis present

## 2019-12-18 DIAGNOSIS — S0181XA Laceration without foreign body of other part of head, initial encounter: Secondary | ICD-10-CM | POA: Diagnosis not present

## 2019-12-18 DIAGNOSIS — E860 Dehydration: Secondary | ICD-10-CM | POA: Diagnosis present

## 2019-12-18 DIAGNOSIS — I480 Paroxysmal atrial fibrillation: Secondary | ICD-10-CM | POA: Diagnosis not present

## 2019-12-18 DIAGNOSIS — Z9861 Coronary angioplasty status: Secondary | ICD-10-CM | POA: Diagnosis not present

## 2019-12-18 DIAGNOSIS — K219 Gastro-esophageal reflux disease without esophagitis: Secondary | ICD-10-CM

## 2019-12-18 DIAGNOSIS — E872 Acidosis, unspecified: Secondary | ICD-10-CM | POA: Diagnosis present

## 2019-12-18 DIAGNOSIS — Z888 Allergy status to other drugs, medicaments and biological substances status: Secondary | ICD-10-CM | POA: Diagnosis not present

## 2019-12-18 DIAGNOSIS — S3993XA Unspecified injury of pelvis, initial encounter: Secondary | ICD-10-CM | POA: Diagnosis not present

## 2019-12-18 DIAGNOSIS — Z91030 Bee allergy status: Secondary | ICD-10-CM | POA: Diagnosis not present

## 2019-12-18 DIAGNOSIS — Z20822 Contact with and (suspected) exposure to covid-19: Secondary | ICD-10-CM | POA: Diagnosis present

## 2019-12-18 DIAGNOSIS — I2510 Atherosclerotic heart disease of native coronary artery without angina pectoris: Secondary | ICD-10-CM | POA: Diagnosis present

## 2019-12-18 DIAGNOSIS — I25110 Atherosclerotic heart disease of native coronary artery with unstable angina pectoris: Secondary | ICD-10-CM | POA: Diagnosis not present

## 2019-12-18 DIAGNOSIS — Z885 Allergy status to narcotic agent status: Secondary | ICD-10-CM | POA: Diagnosis not present

## 2019-12-18 DIAGNOSIS — Z881 Allergy status to other antibiotic agents status: Secondary | ICD-10-CM | POA: Diagnosis not present

## 2019-12-18 DIAGNOSIS — I959 Hypotension, unspecified: Secondary | ICD-10-CM | POA: Diagnosis present

## 2019-12-18 DIAGNOSIS — S3992XA Unspecified injury of lower back, initial encounter: Secondary | ICD-10-CM | POA: Diagnosis not present

## 2019-12-18 DIAGNOSIS — M79651 Pain in right thigh: Secondary | ICD-10-CM | POA: Diagnosis not present

## 2019-12-18 DIAGNOSIS — I951 Orthostatic hypotension: Secondary | ICD-10-CM | POA: Diagnosis not present

## 2019-12-18 DIAGNOSIS — R102 Pelvic and perineal pain: Secondary | ICD-10-CM | POA: Diagnosis not present

## 2019-12-18 DIAGNOSIS — K920 Hematemesis: Secondary | ICD-10-CM | POA: Diagnosis not present

## 2019-12-18 DIAGNOSIS — N179 Acute kidney failure, unspecified: Secondary | ICD-10-CM | POA: Diagnosis present

## 2019-12-18 DIAGNOSIS — I447 Left bundle-branch block, unspecified: Secondary | ICD-10-CM | POA: Diagnosis present

## 2019-12-18 DIAGNOSIS — E782 Mixed hyperlipidemia: Secondary | ICD-10-CM | POA: Diagnosis not present

## 2019-12-18 DIAGNOSIS — N390 Urinary tract infection, site not specified: Secondary | ICD-10-CM | POA: Diagnosis present

## 2019-12-18 DIAGNOSIS — Y9301 Activity, walking, marching and hiking: Secondary | ICD-10-CM | POA: Diagnosis present

## 2019-12-18 DIAGNOSIS — Z23 Encounter for immunization: Secondary | ICD-10-CM | POA: Diagnosis not present

## 2019-12-18 DIAGNOSIS — S199XXA Unspecified injury of neck, initial encounter: Secondary | ICD-10-CM | POA: Diagnosis not present

## 2019-12-18 DIAGNOSIS — Z882 Allergy status to sulfonamides status: Secondary | ICD-10-CM | POA: Diagnosis not present

## 2019-12-18 DIAGNOSIS — I252 Old myocardial infarction: Secondary | ICD-10-CM | POA: Diagnosis not present

## 2019-12-18 DIAGNOSIS — E559 Vitamin D deficiency, unspecified: Secondary | ICD-10-CM | POA: Diagnosis present

## 2019-12-18 DIAGNOSIS — I10 Essential (primary) hypertension: Secondary | ICD-10-CM | POA: Diagnosis not present

## 2019-12-18 DIAGNOSIS — M25551 Pain in right hip: Secondary | ICD-10-CM | POA: Diagnosis not present

## 2019-12-18 DIAGNOSIS — W109XXA Fall (on) (from) unspecified stairs and steps, initial encounter: Secondary | ICD-10-CM | POA: Diagnosis present

## 2019-12-18 DIAGNOSIS — S0101XA Laceration without foreign body of scalp, initial encounter: Secondary | ICD-10-CM | POA: Diagnosis not present

## 2019-12-18 HISTORY — DX: Hematemesis: K92.0

## 2019-12-18 HISTORY — DX: Acidosis, unspecified: E87.20

## 2019-12-18 LAB — COMPREHENSIVE METABOLIC PANEL
ALT: 44 U/L (ref 0–44)
ALT: 58 U/L — ABNORMAL HIGH (ref 0–44)
AST: 45 U/L — ABNORMAL HIGH (ref 15–41)
AST: 63 U/L — ABNORMAL HIGH (ref 15–41)
Albumin: 3.3 g/dL — ABNORMAL LOW (ref 3.5–5.0)
Albumin: 4.1 g/dL (ref 3.5–5.0)
Alkaline Phosphatase: 63 U/L (ref 38–126)
Alkaline Phosphatase: 80 U/L (ref 38–126)
Anion gap: 10 (ref 5–15)
Anion gap: 13 (ref 5–15)
BUN: 15 mg/dL (ref 8–23)
BUN: 16 mg/dL (ref 8–23)
CO2: 20 mmol/L — ABNORMAL LOW (ref 22–32)
CO2: 21 mmol/L — ABNORMAL LOW (ref 22–32)
Calcium: 8.3 mg/dL — ABNORMAL LOW (ref 8.9–10.3)
Calcium: 9.2 mg/dL (ref 8.9–10.3)
Chloride: 109 mmol/L (ref 98–111)
Chloride: 112 mmol/L — ABNORMAL HIGH (ref 98–111)
Creatinine, Ser: 1.22 mg/dL — ABNORMAL HIGH (ref 0.44–1.00)
Creatinine, Ser: 1.47 mg/dL — ABNORMAL HIGH (ref 0.44–1.00)
GFR calc Af Amer: 43 mL/min — ABNORMAL LOW (ref >60)
GFR calc Af Amer: 53 mL/min — ABNORMAL LOW (ref >60)
GFR calc non Af Amer: 37 mL/min — ABNORMAL LOW (ref >60)
GFR calc non Af Amer: 46 mL/min — ABNORMAL LOW (ref >60)
Glucose, Bld: 117 mg/dL — ABNORMAL HIGH (ref 70–99)
Glucose, Bld: 97 mg/dL (ref 70–99)
Potassium: 3.8 mmol/L (ref 3.5–5.1)
Potassium: 4 mmol/L (ref 3.5–5.1)
Sodium: 142 mmol/L (ref 135–145)
Sodium: 143 mmol/L (ref 135–145)
Total Bilirubin: 0.4 mg/dL (ref 0.3–1.2)
Total Bilirubin: 0.6 mg/dL (ref 0.3–1.2)
Total Protein: 5.6 g/dL — ABNORMAL LOW (ref 6.5–8.1)
Total Protein: 7.1 g/dL (ref 6.5–8.1)

## 2019-12-18 LAB — PROTIME-INR
INR: 1 (ref 0.8–1.2)
Prothrombin Time: 13 seconds (ref 11.4–15.2)

## 2019-12-18 LAB — CBC WITH DIFFERENTIAL/PLATELET
Abs Immature Granulocytes: 0.02 10*3/uL (ref 0.00–0.07)
Basophils Absolute: 0.1 10*3/uL (ref 0.0–0.1)
Basophils Relative: 1 %
Eosinophils Absolute: 0 10*3/uL (ref 0.0–0.5)
Eosinophils Relative: 0 %
HCT: 39.1 % (ref 36.0–46.0)
Hemoglobin: 12.9 g/dL (ref 12.0–15.0)
Immature Granulocytes: 0 %
Lymphocytes Relative: 17 %
Lymphs Abs: 1.2 10*3/uL (ref 0.7–4.0)
MCH: 32.4 pg (ref 26.0–34.0)
MCHC: 33 g/dL (ref 30.0–36.0)
MCV: 98.2 fL (ref 80.0–100.0)
Monocytes Absolute: 0.5 10*3/uL (ref 0.1–1.0)
Monocytes Relative: 7 %
Neutro Abs: 5.3 10*3/uL (ref 1.7–7.7)
Neutrophils Relative %: 75 %
Platelets: 303 10*3/uL (ref 150–400)
RBC: 3.98 MIL/uL (ref 3.87–5.11)
RDW: 14 % (ref 11.5–15.5)
WBC: 7.2 10*3/uL (ref 4.0–10.5)
nRBC: 0 % (ref 0.0–0.2)

## 2019-12-18 LAB — ECHOCARDIOGRAM COMPLETE: Weight: 3118.4 oz

## 2019-12-18 LAB — URINALYSIS, ROUTINE W REFLEX MICROSCOPIC
Bilirubin Urine: NEGATIVE
Glucose, UA: NEGATIVE mg/dL
Hgb urine dipstick: NEGATIVE
Ketones, ur: NEGATIVE mg/dL
Nitrite: NEGATIVE
Protein, ur: NEGATIVE mg/dL
Specific Gravity, Urine: 1.027 (ref 1.005–1.030)
pH: 5 (ref 5.0–8.0)

## 2019-12-18 LAB — LACTIC ACID, PLASMA
Lactic Acid, Venous: 2.1 mmol/L (ref 0.5–1.9)
Lactic Acid, Venous: 2.6 mmol/L (ref 0.5–1.9)
Lactic Acid, Venous: 3 mmol/L (ref 0.5–1.9)

## 2019-12-18 LAB — D-DIMER, QUANTITATIVE: D-Dimer, Quant: 1.63 ug/mL-FEU — ABNORMAL HIGH (ref 0.00–0.50)

## 2019-12-18 LAB — TROPONIN I (HIGH SENSITIVITY)
Troponin I (High Sensitivity): 4 ng/L (ref <18)
Troponin I (High Sensitivity): 5 ng/L (ref <18)
Troponin I (High Sensitivity): 6 ng/L (ref <18)

## 2019-12-18 LAB — APTT: aPTT: 37 seconds — ABNORMAL HIGH (ref 24–36)

## 2019-12-18 LAB — BRAIN NATRIURETIC PEPTIDE: B Natriuretic Peptide: 35.3 pg/mL (ref 0.0–100.0)

## 2019-12-18 LAB — SARS CORONAVIRUS 2 BY RT PCR (HOSPITAL ORDER, PERFORMED IN ~~LOC~~ HOSPITAL LAB): SARS Coronavirus 2: NEGATIVE

## 2019-12-18 LAB — TSH: TSH: 1.134 u[IU]/mL (ref 0.350–4.500)

## 2019-12-18 LAB — MAGNESIUM: Magnesium: 1.4 mg/dL — ABNORMAL LOW (ref 1.7–2.4)

## 2019-12-18 LAB — HIV ANTIBODY (ROUTINE TESTING W REFLEX): HIV Screen 4th Generation wRfx: NONREACTIVE

## 2019-12-18 MED ORDER — IOHEXOL 350 MG/ML SOLN
75.0000 mL | Freq: Once | INTRAVENOUS | Status: AC | PRN
  Administered 2019-12-18: 75 mL via INTRAVENOUS

## 2019-12-18 MED ORDER — SODIUM CHLORIDE 0.9 % IV SOLN: INTRAVENOUS | Status: AC

## 2019-12-18 MED ORDER — PANTOPRAZOLE SODIUM 40 MG PO TBEC
40.0000 mg | DELAYED_RELEASE_TABLET | Freq: Every day | ORAL | Status: DC
  Administered 2019-12-18 – 2019-12-19 (×2): 40 mg via ORAL
  Filled 2019-12-18 (×2): qty 1

## 2019-12-18 MED ORDER — HYDROXYZINE HCL 25 MG PO TABS: 50.0000 mg | ORAL_TABLET | Freq: Every evening | ORAL | Status: DC | PRN

## 2019-12-18 MED ORDER — NITROGLYCERIN 0.4 MG SL SUBL: 0.4000 mg | SUBLINGUAL_TABLET | SUBLINGUAL | Status: DC | PRN

## 2019-12-18 MED ORDER — ACETAMINOPHEN 325 MG PO TABS: 650.0000 mg | ORAL_TABLET | Freq: Four times a day (QID) | ORAL | Status: DC | PRN

## 2019-12-18 MED ORDER — CLOPIDOGREL BISULFATE 75 MG PO TABS
75.0000 mg | ORAL_TABLET | Freq: Every day | ORAL | Status: DC
  Administered 2019-12-18 – 2019-12-19 (×2): 75 mg via ORAL
  Filled 2019-12-18 (×2): qty 1

## 2019-12-18 MED ORDER — OXYCODONE-ACETAMINOPHEN 5-325 MG PO TABS
1.0000 | ORAL_TABLET | ORAL | Status: DC | PRN
  Administered 2019-12-18 (×2): 1 via ORAL
  Filled 2019-12-18 (×2): qty 1

## 2019-12-18 MED ORDER — METOPROLOL TARTRATE 12.5 MG HALF TABLET
12.5000 mg | ORAL_TABLET | Freq: Two times a day (BID) | ORAL | Status: DC
  Administered 2019-12-18 – 2019-12-19 (×3): 12.5 mg via ORAL
  Filled 2019-12-18 (×3): qty 1

## 2019-12-18 MED ORDER — MAGNESIUM SULFATE 4 GM/100ML IV SOLN
4.0000 g | Freq: Once | INTRAVENOUS | Status: AC
  Administered 2019-12-18: 4 g via INTRAVENOUS
  Filled 2019-12-18: qty 100

## 2019-12-18 MED ORDER — HYDROXYZINE PAMOATE 50 MG PO CAPS: 50.0000 mg | ORAL_CAPSULE | Freq: Every evening | ORAL | Status: DC | PRN

## 2019-12-18 MED ORDER — ACETAMINOPHEN 650 MG RE SUPP: 650.0000 mg | Freq: Four times a day (QID) | RECTAL | Status: DC | PRN

## 2019-12-18 MED ORDER — SODIUM CHLORIDE 0.9 % IV BOLUS
500.0000 mL | Freq: Once | INTRAVENOUS | Status: AC
  Administered 2019-12-18: 500 mL via INTRAVENOUS

## 2019-12-18 MED ORDER — POLYETHYLENE GLYCOL 3350 17 G PO PACK: 17.0000 g | PACK | Freq: Every day | ORAL | Status: DC | PRN

## 2019-12-18 MED ORDER — SODIUM CHLORIDE 0.9 % IV BOLUS
1000.0000 mL | Freq: Once | INTRAVENOUS | Status: AC
  Administered 2019-12-18: 1000 mL via INTRAVENOUS

## 2019-12-18 MED ORDER — ONDANSETRON HCL 4 MG/2ML IJ SOLN: 4.0000 mg | Freq: Four times a day (QID) | INTRAMUSCULAR | Status: DC | PRN

## 2019-12-18 MED ORDER — ONDANSETRON HCL 4 MG PO TABS: 4.0000 mg | ORAL_TABLET | Freq: Four times a day (QID) | ORAL | Status: DC | PRN

## 2019-12-18 MED ORDER — SODIUM CHLORIDE 0.9 % IV SOLN: INTRAVENOUS | Status: DC

## 2019-12-18 NOTE — Progress Notes (Signed)
64 year old female with history of CAD, obstructive sleep apnea, hypertension admitted this morning with multiple syncopal episodes and palpitations.  When I saw her in ER she had ambulated to the restroom and was not lightheaded.  Cardiology has been consulted await their recommendations.  Echo done results pending.

## 2019-12-18 NOTE — ED Notes (Signed)
Lunch Tray Ordered @ 1041. 

## 2019-12-18 NOTE — ED Provider Notes (Signed)
Attestation: Medical screening examination/treatment/procedure(s) were conducted as a shared visit with non-physician practitioner(s) and myself.  I personally evaluated the patient during the encounter.   Briefly, the patient is a 64 y.o. female with h/o A. Fib, MI, GI bleed, bipolar who presents for evaluation of fall.  Patient states she is unsure if she had a syncopal event. She was walking down the stairs it was not approximately 6-7 steps from the bottom when she remembers waking up at the bottom of the staircase.  Vitals:   12/18/19 0630 12/18/19 0645  BP: 121/64 119/68  Pulse: 95 97  Resp: 16 16  Temp:    SpO2: 94% 95%    CONSTITUTIONAL:  nontoxic-appearing, NAD NEURO:  Alert and oriented x 3, no focal deficits EYES:  pupils equal and reactive ENT/NECK:  trachea midline, no JVD CARDIO:  tachy rate, reg rhythm, well-perfused PULM:  None labored breathing GI/GU:  Abdomin non-distended MSK/SPINE:  No gross deformities, no edema SKIN:  no rash, 1.5 centimeters posterior left scalp laceration. PSYCH:  Appropriate speech and behavior   EKG Interpretation  Date/Time:  Monday Dec 17 2019 23:28:57 EDT Ventricular Rate:  102 PR Interval:    QRS Duration: 156 QT Interval:  417 QTC Calculation: 544 R Axis:   8 Text Interpretation: Sinus tachycardia Left bundle branch block No significant change since last tracing Confirmed by Drema Pry [phone]) on 12/17/2019 11:47:30 PM       Noted to be hypotensive. IVF given and improved. AKI noted. No anemia. Imaging reassuring.  Lac repaired by APP.  Admitted to medicine     Eudelia Bunch Amadeo Garnet, MD 12/18/19 [phone]

## 2019-12-18 NOTE — H&P (Signed)
History and Physical    Tymber Stallings XKG:[id number] DOB: [birth year] DOA: 12/17/2019  PCP: [facility] @ [place]  Patient coming from: home   Chief Complaint: loss of consciousness    HPI:    64 year old female with past medical history of hyperlipidemia, hypertension, gastroesophageal reflux disease, coronary artery disease (status post MI in 2010, last cath 09/2018 with multivessel disease S/P 2 DES) [facility] emergency department with complaints of frequent bouts of lightheadedness with an episode of loss of consciousness.   Patient explains that for the past several weeks she has been experiencing episodes of lightheadedness.  Patient explains that these episodes of lightheadedness typically occur upon arising from a seated position and ambulating.  Patient symptoms continue to persist until the patient experienced an episode of loss of conscious at home on 5/12 prompting presentation to [facility] emergency department.  Patient was worked up and discharged home at that time.  Since her discharge from the emergency department she continues to experience frequent bouts of severe lightheadedness, worsening in intensity, typically occurring upon standing or with ambulation.  Patient denies palpitations or chest pain prior to these episodes.  Patient denies any tonic-clonic seizure-like activity, biting of the tongue, self urination or self defecation with these episodes.  The recent changes to her home medications.  Patient denies any weight loss, night sweats, fevers nausea vomiting or diarrhea as of late.  Patient additionally complains of dyspnea on exertion.  Patient states that these episodes of dyspnea on exertion have been occurring over the past month and have been increasing in intensity.  Finally, patient has also complaining of intermittent bouts of chest discomfort.  Patient describes this chest discomfort as sharp in quality, mild to moderate  intensity, located in the left chest and nonradiating.  Patient states that these episodes occur with and without exertion.  Patient is unable to identify any alleviating or exacerbating factors.  Prior to her presentation today the patient experienced yet another episode of suspected syncope.  Upon evaluation in the emergency department patient was found to be extremely hypotensive, resulting in a another episode of witnessed syncope by the nursing staff.  Patient was hydrated with 2 L of normal saline.  Troponin was found to be unremarkable.  Patient was found to also have an elevated lactate of 3.0.  Due to notably elevated D-dimer of 1.63 CT angiogram of the chest was performed and was negative for pulmonary embolism.  The hospitalist group was then called to assess patient for additional hospital.  The hospitalist group was then called to assess the patient for admission the hospital.   Review of Systems: A 10-system review of systems has been performed and all systems are negative with the exception of what is listed in the HPI and the following:    GI: Patient endorses several episodes of hematemesis occurring approximately 1 day monthly.   Past Medical History:  Diagnosis Date  . Anginal pain ([place])   . Atrial fibrillation ([place])   . Bipolar disorder ([place])   . Cataract    OU  . Coronary artery disease involving native coronary artery with angina pectoris ([place]) 03/24/2015  . Dysrhythmia   . GERD (gastroesophageal reflux disease)   . History of appendectomy 1963  . Hypertension   . Hypertensive retinopathy    OU  . Myocardial infarction ([place])    per pt due to Vit D deficiency  . Vitamin D deficiency     Past Surgical History:  Procedure  Laterality Date  . APPENDECTOMY    . CARDIAC CATHETERIZATION N/A 03/25/2015   Procedure: Left Heart Cath and Coronary Angiography;  Surgeon: Marykay Lex, MD;  Location: [facility] INV[facility] CV LAB;  Service: Cardiovascular;  Laterality: N/A;  . CORONARY  STENT INTERVENTION N/A 10/09/2018   Procedure: CORONARY STENT INTERVENTION;  Surgeon: Corky Crafts, MD;  Location: [facility] INVASIVE CV LAB;  Service: Cardiovascular;  Laterality: N/A;  . DILATATION & CURETTAGE/HYSTEROSCOPY WITH MYOSURE N/A 05/19/2016   Procedure: DILATATION & CURETTAGE/HYSTEROSCOPY WITH MYOSURE;  Surgeon: Geryl Rankins, MD;  Location: [facility] ORS;  Service: Gynecology;  Laterality: N/A;  Possible Myosure for polyps.  [name] EYE SURGERY    . LEFT HEART CATH AND CORONARY ANGIOGRAPHY N/A 10/09/2018   Procedure: LEFT HEART CATH AND CORONARY ANGIOGRAPHY;  Surgeon: Corky Crafts, MD;  Location: [facility] INVASIVE CV LAB;  Service: Cardiovascular;  Laterality: N/A;  . MOUTH SURGERY    . URETHRAL DILATION       reports that she has never smoked. She has never used smokeless tobacco. She reports current alcohol use of about 2.0 standard drinks of alcohol per week. She reports that she does not use drugs.  Allergies  Allergen Reactions  . Bee Venom Anaphylaxis  . Lanolin Itching and Other (See Comments)    "Wool"  . Sulfa Antibiotics Swelling and Rash  . Ciprofloxacin Nausea And Vomiting  . Depakote [Divalproex Sodium] Other (See Comments)    Reaction not recalled- "it was 20 years ago"  . Pine Itching  . Risperidone Other (See Comments)    Unknown reaction  . Seroquel [Quetiapine Fumarate] Other (See Comments)    VIVID nightmares  . Statins Other (See Comments)    joint pain, weakness  . Tramadol Other (See Comments)    Serotonin syndrome  . Citalopram Rash and Other (See Comments)    Insomnia and agitation also  . Lamotrigine Itching, Rash and Other (See Comments)    Stevens-Johnson syndrome and Brown urine, also  . Other Other (See Comments) and Cough    Cat dander  . Topiramate Rash and Other (See Comments)    Nightmares, brown urine also  . Zetia [Ezetimibe]     Joint pain, weakness     Family History  Problem Relation Age of Onset  . Vascular Disease Mother   .  Dementia Mother   . Heart attack Maternal Grandmother   . Heart attack Maternal Grandfather   . Kidney failure Paternal Grandmother   . Heart attack Paternal Grandfather      Prior to Admission medications   Medication Sig Start Date End Date Taking? Authorizing Provider  amLODipine (NORVASC) 2.5 MG tablet Take 1 tablet (2.5 mg total) by mouth daily. Patient taking differently: Take 2.5 mg by mouth at bedtime.  10/11/19  Yes Lennette Bihari, MD  b complex vitamins tablet Take 1 tablet by mouth daily.   Yes [provider]  BIOTIN PO Take 1 tablet by mouth daily.   Yes [provider]  Cholecalciferol (VITAMIN D3) 125 MCG (5000 UT) CAPS Take 5,000 Units by mouth daily.   Yes [provider]  clopidogrel (PLAVIX) 75 MG tablet TAKE ONE TABLET BY MOUTH ONE TIME DAILY Patient taking differently: Take 75 mg by mouth daily.  08/01/19  Yes Bhagat, Bhavinkumar, PA  Coenzyme Q10 (COQ-10 PO) Take 1 capsule by mouth daily.    Yes [provider]  Cyanocobalamin (B-12 PO) Take 1 tablet by mouth 2 (two) times a week.  Yes [provider]  esomeprazole (NEXIUM 24HR CLEAR MINIS) 20 MG capsule Take 20 mg by mouth daily before breakfast.   Yes [provider]  hydrOXYzine (VISTARIL) 50 MG capsule Take 50 mg by mouth at bedtime as needed for anxiety (sleep).  03/03/15  Yes [provider]  ibuprofen (ADVIL,MOTRIN) 200 MG tablet Take 200 mg by mouth daily as needed for headache, mild pain or moderate pain.    Yes [provider]  losartan (COZAAR) 100 MG tablet Take 100 mg by mouth daily. 02/19/15  Yes [provider]  metoprolol tartrate (LOPRESSOR) 25 MG tablet TAKE ONE-HALF TABLET BY MOUTH TWICE A DAY Patient taking differently: Take 12.5 mg by mouth 2 (two) times daily. TAKE ONE-HALF TABLET BY MOUTH TWICE A DAY 05/03/19  Yes Chilton Siandolph, Tiffany, MD  Omega-3 Fatty Acids (OMEGA 3 PO) Take 1 capsule by mouth daily.   Yes [provider]  POTASSIUM PO Take 1 tablet by mouth 2 (two) times a week.   Yes [provider]  nitroGLYCERIN (NITROSTAT) 0.4 MG SL tablet Place 1 tablet (0.4 mg total) under the tongue as needed for chest pain. 11/08/17   Azalee CourseMeng, Hao, PA    Physical Exam: Vitals:   12/18/19 0155 12/18/19 0245 12/18/19 0300 12/18/19 0315  BP: 104/65 112/68 110/64 124/69  Pulse: 88 85 87 89  Resp: 17 14 14 14   Temp:      TempSrc:      SpO2: 97% 100% 98% 100%    Constitutional: Acute alert and oriented x3, no associated distress.   Skin: no rashes, no lesions, good skin turgor noted. Eyes: Pupils are equally reactive to light.  No evidence of scleral icterus or conjunctival pallor.  ENMT: Moist mucous membranes noted.  Posterior pharynx clear of any exudate or lesions.   Neck: normal, supple, no masses, no thyromegaly.  No evidence of jugular venous distension.   Respiratory: clear to auscultation bilaterally, no wheezing, no crackles. Normal respiratory effort. No accessory muscle use.  Cardiovascular: Regular rate and rhythm, no murmurs / rubs / gallops. No extremity edema. 2+ pedal pulses. No carotid bruits.  Chest:   Nontender without crepitus or deformity.   Back:   Nontender without crepitus or deformity. Abdomen: Abdomen is soft and nontender.  No evidence of intra-abdominal masses.  Positive bowel sounds noted in all quadrants.   Musculoskeletal: No joint deformity upper and lower extremities. Good ROM, no contractures. Normal muscle tone.  Neurologic: CN 2-12 grossly intact. Sensation intact, strength noted to be 5 out of 5 in all 4 extremities.  Patient is following all commands.  Patient is responsive to verbal stimuli.   Psychiatric: Patient presents as a normal mood with appropriate affect.  Patient seems to possess insight as to theircurrent situation.     Labs on Admission: I have personally reviewed following labs and imaging studies -   CBC: Recent Labs  Lab 12/17/19 2338    WBC 7.2  NEUTROABS 5.3  HGB 12.9  HCT 39.1  MCV 98.2  PLT 303   Basic Metabolic Panel: Recent Labs  Lab 12/17/19 2338  NA 143  K 4.0  CL 109  CO2 21*  GLUCOSE 97  BUN 15  CREATININE 1.47*  CALCIUM 9.2   GFR: CrCl cannot be calculated (Unknown ideal weight.). Liver Function Tests: Recent Labs  Lab 12/17/19 2338  AST 63*  ALT 58*  ALKPHOS 80  BILITOT 0.6  PROT 7.1  ALBUMIN 4.1   No results for  input(s): LIPASE, AMYLASE in the last 168 hours. No results for input(s): AMMONIA in the last 168 hours. Coagulation Profile: No results for input(s): INR, PROTIME in the last 168 hours. Cardiac Enzymes: No results for input(s): CKTOTAL, CKMB, CKMBINDEX, TROPONINI in the last 168 hours. BNP (last 3 results) No results for input(s): PROBNP in the last 8760 hours. HbA1C: No results for input(s): HGBA1C in the last 72 hours. CBG: No results for input(s): GLUCAP in the last 168 hours. Lipid Profile: No results for input(s): CHOL, HDL, LDLCALC, TRIG, CHOLHDL, LDLDIRECT in the last 72 hours. Thyroid Function Tests: No results for input(s): TSH, T4TOTAL, FREET4, T3FREE, THYROIDAB in the last 72 hours. Anemia Panel: No results for input(s): VITAMINB12, FOLATE, FERRITIN, TIBC, IRON, RETICCTPCT in the last 72 hours. Urine analysis: No results found for: COLORURINE, APPEARANCEUR, LABSPEC, PHURINE, GLUCOSEU, HGBUR, BILIRUBINUR, KETONESUR, PROTEINUR, UROBILINOGEN, NITRITE, LEUKOCYTESUR  Radiological Exams on Admission - Personally Reviewed: DG Lumbar Spine Complete  Result Date: 12/18/2019 CLINICAL DATA:  Syncope, fall. EXAM: LUMBAR SPINE - COMPLETE 4+ VIEW COMPARISON:  Lumbar radiograph 12/05/2019 FINDINGS: Mild broad-based levo scoliotic curvature which may be positional. Previous anterolisthesis of L4 on L5 is no longer seen. No acute fracture. Vertebral body heights are preserved. Mild multilevel degenerative disc disease most prominent at L2-L3. There is prominent facet  hypertrophy at multiple levels. Sacroiliac joints are congruent. Stable coarse calcification in the left pelvis, presumed fibroid. IMPRESSION: 1. No acute fracture of the lumbar spine. 2. Multilevel degenerative change, grossly stable from prior. Electronically Signed   By: Narda Rutherford M.D.   On: 12/18/2019 00:57   DG Pelvis 1-2 Views  Result Date: 12/18/2019 CLINICAL DATA:  Post fall.  Syncope.  Right hip pain. EXAM: PELVIS - 1-2 VIEW COMPARISON:  Pelvis radiograph 12/05/2019 FINDINGS: The cortical margins of the bony pelvis are intact. No fracture. Pubic symphysis and sacroiliac joints are congruent. Coarse calcification again seen in the left pelvis. Both femoral heads are well-seated in the respective acetabula. IMPRESSION: No pelvic fracture. Electronically Signed   By: Narda Rutherford M.D.   On: 12/18/2019 00:58   DG Sacrum/Coccyx  Result Date: 12/18/2019 CLINICAL DATA:  Syncope, pelvic pain. EXAM: SACRUM AND COCCYX - 2+ VIEW COMPARISON:  Sacrum radiograph 12/05/2019 FINDINGS: Cortical irregularity involving the sacrococcygeal junction that was not seen on prior exam and suspicious for nondisplaced fracture. The sacral ala are maintained. The sacroiliac joints are congruent. Coarse calcification in the left pelvis again seen. IMPRESSION: Cortical irregularity involving the sacrococcygeal junction, not seen on recent prior exam and suspicious for nondisplaced fracture. Electronically Signed   By: Narda Rutherford M.D.   On: 12/18/2019 00:59   CT Head Wo Contrast  Result Date: 12/18/2019 CLINICAL DATA:  Syncope, recurrent. Fall at home. Loss of consciousness. Laceration to back of head. EXAM: CT HEAD WITHOUT CONTRAST TECHNIQUE: Contiguous axial images were obtained from the base of the skull through the vertex without intravenous contrast. COMPARISON:  Head CT 12/05/2019 FINDINGS: Brain: No intracranial hemorrhage, mass effect, or midline shift. Age related atrophy. No hydrocephalus. The  basilar cisterns are patent. No evidence of territorial infarct or acute ischemia. Minor chronic small vessel ischemia. No extra-axial or intracranial fluid collection. Vascular: Atherosclerosis of skullbase vasculature without hyperdense vessel or abnormal calcification. Skull: No fracture or focal lesion. Sinuses/Orbits: Paranasal sinuses and mastoid air cells are clear. The visualized orbits are unremarkable. Other: None. IMPRESSION: 1. No acute intracranial abnormality. No skull fracture. 2. Age related atrophy and minimal chronic small vessel ischemia.  Electronically Signed   By: Narda Rutherford M.D.   On: 12/18/2019 01:10   CT Angio Chest PE W/Cm &/Or Wo Cm  Result Date: 12/18/2019 CLINICAL DATA:  Recurrent syncope EXAM: CT ANGIOGRAPHY CHEST WITH CONTRAST TECHNIQUE: Multidetector CT imaging of the chest was performed using the standard protocol during bolus administration of intravenous contrast. Multiplanar CT image reconstructions and MIPs were obtained to evaluate the vascular anatomy. CONTRAST:  14mL OMNIPAQUE IOHEXOL 350 MG/ML SOLN COMPARISON:  None. FINDINGS: Cardiovascular: Contrast injection is sufficient to demonstrate satisfactory opacification of the pulmonary arteries to the segmental level. There is no pulmonary embolus or evidence of right heart strain. The size of the main pulmonary artery is normal. Heart size is normal, with no pericardial effusion. The course and caliber of the aorta are normal. There is no atherosclerotic calcification. Opacification decreased due to pulmonary arterial phase contrast bolus timing. Mediastinum/Nodes: No mediastinal, hilar or axillary lymphadenopathy. Normal visualized thyroid. Small hiatal hernia. Lungs/Pleura: Airways are patent. No pleural effusion, lobar consolidation, pneumothorax or pulmonary infarction. Upper Abdomen: Contrast bolus timing is not optimized for evaluation of the abdominal organs. The visualized portions of the organs of the upper  abdomen are normal. Musculoskeletal: No chest wall abnormality. No bony spinal canal stenosis. Review of the MIP images confirms the above findings. IMPRESSION: 1. No pulmonary embolus or other acute thoracic abnormality. 2. Small hiatal hernia. Electronically Signed   By: Deatra Robinson M.D.   On: 12/18/2019 02:46   CT Cervical Spine Wo Contrast  Result Date: 12/18/2019 CLINICAL DATA:  Syncope, fall at home. Positive loss of consciousness. Laceration to back of head. EXAM: CT CERVICAL SPINE WITHOUT CONTRAST TECHNIQUE: Multidetector CT imaging of the cervical spine was performed without intravenous contrast. Multiplanar CT image reconstructions were also generated. COMPARISON:  CT 12/05/2019 FINDINGS: Alignment: Straightening of normal lordosis. No traumatic subluxation. Skull base and vertebrae: No acute fracture. Vertebral body heights are maintained. The dens and skull base are intact. Soft tissues and spinal canal: No prevertebral fluid or swelling. No visible canal hematoma. Disc levels: Unchanged degenerative disc disease and facet hypertrophy. Disc space narrowing and endplate spurring most prominent at C5-C6. Upper chest: No acute findings. Other: Mild carotid calcifications. IMPRESSION: 1. No acute fracture or subluxation of the cervical spine. 2. Unchanged degenerative disc disease and facet hypertrophy. Electronically Signed   By: Narda Rutherford M.D.   On: 12/18/2019 01:14   DG Chest Portable 1 View  Result Date: 12/18/2019 CLINICAL DATA:  Syncope. EXAM: PORTABLE CHEST 1 VIEW COMPARISON:  Radiograph 12/05/2019 FINDINGS: Upper normal heart size with unchanged mediastinal contours. Lower lung volumes from prior exam. No pneumothorax, large pleural effusion, focal airspace disease or pulmonary edema. No acute osseous abnormalities are seen. IMPRESSION: Lower lung volumes from prior exam without acute abnormality. Electronically Signed   By: Narda Rutherford M.D.   On: 12/18/2019 00:55   DG Femur  Min 2 Views Right  Result Date: 12/18/2019 CLINICAL DATA:  Syncope. Right femur pain. EXAM: RIGHT FEMUR 2 VIEWS COMPARISON:  None. FINDINGS: Cortical margins of the femur are intact. There is no evidence of fracture or other focal bone lesions. Soft tissues are unremarkable. Mild degenerative change of the hip and knee. IMPRESSION: No fracture or acute abnormality of the right femur. Electronically Signed   By: Narda Rutherford M.D.   On: 12/18/2019 00:54    EKG: Personally reviewed.  Rhythm is sinus tachycardia with heart rate of 102 per minute.  No dynamic ST segment changes appreciated.  Assessment/Plan Principal Problem:   Syncope   Patient presenting with several episodes of syncope with a several week history of lightheadedness that seems orthostatic in nature  Patient hypotensive in the emergency department with hypotension resolving with intravenous volume resuscitation with concurrent lactic acidosis all suggestive of again orthostatic syncope.  Continuing to hydrate with intravenous isotonic fluids  Temporarily holding home regimen of antihypertensives  Monitoring patient on telemetry  Obtaining orthostatic vital signs  Considering history of substantial coronary disease, monitoring patient on telemetry, obtaining cardiac enzymes, Echocardiogram in the morning  Carotid massage performed in the emergency department elicits no evidence of carotid sinus syndrome.  Active Problems:  Chest Pain   Patient presenting with atypical episodes of chest discomfort over the past several weeks to months.  Patient endorses history of noncompliance with antiplatelet therapy and is not on statin therapy for hyperlipidemia due to intolerance to statins and Zetia.  Considering history of significant coronary artery disease, patient would benefit from noninvasive ischemic assessment.  Consider discussion with cardiology to proceed with this prior to discharge.  Cycling cardiac  enzymes  Monitoring patient on telemetry  Hematemesis   Patient reports longstanding history of intermittent hematemesis  No active bleeding on presentation  Hemoglobin and hematocrit unremarkable  Continuing home regimen of PPI  Outpatient GI evaluation    Lactic acidosis  Likely secondary to volume depletion  Hydrating patient with intravenous isotonic fluids  Performing serial lactic acid levels to ensure downtrending and resolution  Chest x-ray reveals no evidence of pneumonia.  Urinalysis has been ordered and is pending.    Essential hypertension   Holding home regimen of antihypertensives at this time.    Mixed hyperlipidemia   Patient has known history of hyperlipidemia in the setting of significant coronary artery disease  Side effects to statins and Zetia.  Patient reports not being able to afford Repatha.    PAF (paroxysmal atrial fibrillation) (HCC)   Currently in sinus rhythm  Monitoring on telemetry    Coronary artery disease involving native coronary artery of native heart   Patient has known history of substantial coronary artery disease, last    GERD without esophagitis    Patient reports intermittent bouts of hematemesis in the outpatient setting (possibly once a month) however there is no evidence of active bleeding with a normal hemoglobin and hematocrit here.  We will place patient on daily PPI for now, recommend outpatient GI evaluation  Code Status:  Full code Family Communication: deferred  Status is: Observation  The patient remains OBS appropriate and will d/c before 2 midnights.  Dispo: The patient is from: Home              Anticipated d/c is to: Home              Anticipated d/c date is: 2 days              Patient currently is not medically stable to d/c.        Marinda Elk MD [facility] Pager [phone]  If 7PM-7AM, please contact night-coverage [email or web address] Use universal [facility]  password for that web site. If you do not have the password, please call the hospital operator.  12/18/2019, 4:00 AM

## 2019-12-18 NOTE — ED Notes (Signed)
Pt aware we need urine sample, unable to get sample at this time

## 2019-12-18 NOTE — Progress Notes (Signed)
PT Cancellation Note  Patient Details Name: Sarajean Dessert MRN: [id number] DOB: 12/18/1955   Cancelled Treatment:    Reason Eval/Treat Not Completed: Other (comment) MD currently in room. Will follow up as schedule allows.   Cindee Salt, DPT  Acute Rehabilitation Services  Pager: [phone] Office: [phone]  [facility] Prom 12/18/2019, 3:54 PM

## 2019-12-18 NOTE — Consult Note (Addendum)
Cardiology Consultation:   Patient ID: [name] MRN: [id number]; DOB: 09/21/1955  Admit date: 12/17/2019 Date of Consult: 12/18/2019  Primary Care Provider: [name] Primary Cardiologist: Skeet Latch, MD  Primary Electrophysiologist:  None    Patient Profile:   Lindsay [name] is a 64 y.o. female with a hx of OSA on CPAP, HTN, HLD, GERD, CAD s/p MI in 2010 and subsequent DES x 2 to diag 1 in 09/2018, paroxysmal Afib not on a/c due to GIB, bipolar, previous alcohol abuse who is being seen today for the evaluation of syncope at the request of Lindsay [name].  History of Present Illness:   Lindsay [name] is followed by Lindsay [name] for the above cardiac issues. Patient has history of CAD with MI in 2010, cath in 02/2015 with 50% in RCA and minimal disease. Patient had recurrent chest discomfort and underwent cardiac cath 09/2018 showing 20% prox RCA lesion, 50% distal RCA lesion, 75% D1 treated with DES x 2. EF was 55-60%. Placed on DAPT for 6 months.   The patient was most recently seen by Lindsay [name] for OSA management on 09/21/19 and was trying to get a different mask for the patient. Follow-up with Lindsay [name] was recommended given LDL of 204.  The patient presented to the ED 12/17/19 for syncope and LOC. Symptoms have been going on for 6 months but have been getting worse in the last month. Says they occur most every day during the day while ambulating, standing up, or bending over. She feels associated palpitations at times as well as off balance. Once the patient sits or stops the symptoms resolve within a minute. Patient was seen in the ED 5/12 for syncope and work-up was essentially normal and she was discharged. Since then she continued to have severe lightheadedness. She had a fall yesterday from the lightheadedness after bending over and standing up and ended up falling down 8-9 stairs. She called her friend who recommended she call EMS. Also noted sob on exertion and  intermittent chest discomfort that has been going on for the last 2 months, similar to previous cardiac pain but not as severe.   In the ED the patient was hypotensive and given 2L IVF. BP 124/69 with pulse of 89 and 100% O2. Labs showed creatinine 1.47, AST 63, ALT 58, Lactic acid 3.0, D-dimer 1.63. EKG showed sinus tach with LBBB, QtC measured at 531ms .CTA chest showed no PE. CT head showed no acute abnormality. The patient was admitted for further work-up.   The patient takes Losartan 100 mg, amlodipine 2.5mg  daily, and Lopressor 12.5mg  BID. She lost her job in October of last year and since then stress has been much worse. Appetite has been down. She has a history of alcohol abuse starting in 2004 with recovery starting in 2009 and has had many consecutive sober years. She currently drinks 3 glasses of wine a week. No drug use or tobacco use. She hopes to get back to work later this year. She is not on any bipolar medications because she cannot tolerate them. Patient has not been using her CPAP machine.    Past Medical History:  Diagnosis Date  . Anginal pain ([place])   . Atrial fibrillation ([place])   . Bipolar disorder ([place])   . Cataract    OU  . Coronary artery disease involving native coronary artery with angina pectoris ([place]) 03/24/2015  . Dysrhythmia   . GERD (gastroesophageal reflux disease)   . History of appendectomy  1963  . Hypertension   . Hypertensive retinopathy    OU  . Myocardial infarction ([facility])    per pt due to Vit D deficiency  . Vitamin D deficiency     Past Surgical History:  Procedure Laterality Date  . APPENDECTOMY    . CARDIAC CATHETERIZATION N/A 03/25/2015   Procedure: Left Heart Cath and Coronary Angiography;  Surgeon: Marykay Lex, MD;  Location: [facility] INVASIVE CV LAB;  Service: Cardiovascular;  Laterality: N/A;  . CORONARY STENT INTERVENTION N/A 10/09/2018   Procedure: CORONARY STENT INTERVENTION;  Surgeon: Corky Crafts, MD;  Location: [facility] INVASIVE CV  LAB;  Service: Cardiovascular;  Laterality: N/A;  . DILATATION & CURETTAGE/HYSTEROSCOPY WITH MYOSURE N/A 05/19/2016   Procedure: DILATATION & CURETTAGE/HYSTEROSCOPY WITH MYOSURE;  Surgeon: Geryl Rankins, MD;  Location: [facility] ORS;  Service: Gynecology;  Laterality: N/A;  Possible Myosure for polyps.  [name] EYE SURGERY    . LEFT HEART CATH AND CORONARY ANGIOGRAPHY N/A 10/09/2018   Procedure: LEFT HEART CATH AND CORONARY ANGIOGRAPHY;  Surgeon: Corky Crafts, MD;  Location[facility] INVASIVE CV LAB;  Service: Cardiovascular;  Laterality: N/A;  . MOUTH SURGERY    . URETHRAL DILATION       Home Medications:  Prior to Admission medications   Medication Sig Start Date End Date Taking? Authorizing Provider  amLODipine (NORVASC) 2.5 MG tablet Take 1 tablet (2.5 mg total) by mouth daily. Patient taking differently: Take 2.5 mg by mouth at bedtime.  10/11/19  Yes Lennette Bihari, MD  b complex vitamins tablet Take 1 tablet by mouth daily.   Yes [provider]  BIOTIN PO Take 1 tablet by mouth daily.   Yes [provider]  Cholecalciferol (VITAMIN D3) 125 MCG (5000 UT) CAPS Take 5,000 Units by mouth daily.   Yes [provider]  clopidogrel (PLAVIX) 75 MG tablet TAKE ONE TABLET BY MOUTH ONE TIME DAILY Patient taking differently: Take 75 mg by mouth daily.  08/01/19  Yes Bhagat, Bhavinkumar, PA  Coenzyme Q10 (COQ-10 PO) Take 1 capsule by mouth daily.    Yes [provider]  Cyanocobalamin (B-12 PO) Take 1 tablet by mouth 2 (two) times a week.    Yes [provider]  esomeprazole (NEXIUM 24HR CLEAR MINIS) 20 MG capsule Take 20 mg by mouth daily before breakfast.   Yes [provider]  hydrOXYzine (VISTARIL) 50 MG capsule Take 50 mg by mouth at bedtime as needed for anxiety (sleep).  03/03/15  Yes [provider]  ibuprofen (ADVIL,MOTRIN) 200 MG tablet Take 200 mg by mouth daily as needed for headache, mild pain or moderate pain.    Yes [provider]  losartan (COZAAR) 100 MG tablet Take 100 mg by mouth daily. 02/19/15  Yes [provider]  metoprolol tartrate (LOPRESSOR) 25 MG tablet TAKE ONE-HALF TABLET BY MOUTH TWICE A DAY Patient taking differently: Take 12.5 mg by mouth 2 (two) times daily. TAKE ONE-HALF TABLET BY MOUTH TWICE A DAY 05/03/19  Yes Chilton Si, MD  Omega-3 Fatty Acids (OMEGA 3 PO) Take 1 capsule by mouth daily.   Yes [provider]  POTASSIUM PO Take 1 tablet by mouth 2 (two) times a week.   Yes [provider]  nitroGLYCERIN (NITROSTAT) 0.4 MG SL tablet Place 1 tablet (0.4 mg total) under the tongue as needed for chest pain. 11/08/17   Azalee Course, PA    Inpatient Medications: Scheduled Meds: . clopidogrel  75 mg Oral Daily  .  pantoprazole  40 mg Oral Daily   Continuous Infusions: . sodium chloride 100 mL/hr at 12/18/19 0359  . magnesium sulfate bolus IVPB 4 g (12/18/19 1217)   PRN Meds: acetaminophen **OR** acetaminophen, hydrOXYzine, nitroGLYCERIN, ondansetron **OR** ondansetron (ZOFRAN) IV, oxyCODONE-acetaminophen, polyethylene glycol  Allergies:    Allergies  Allergen Reactions  . Bee Venom Anaphylaxis  . Lanolin Itching and Other (See Comments)    "Wool"  . Sulfa Antibiotics Swelling and Rash  . Ciprofloxacin Nausea And Vomiting  . Depakote [Divalproex Sodium] Other (See Comments)    Reaction not recalled- "it was 20 years ago"  . Pine Itching  . Risperidone Other (See Comments)    Unknown reaction  . Seroquel [Quetiapine Fumarate] Other (See Comments)    VIVID nightmares  . Statins Other (See Comments)    joint pain, weakness  . Tramadol Other (See Comments)    Serotonin syndrome  . Citalopram Rash and Other (See Comments)    Insomnia and agitation also  . Lamotrigine Itching, Rash and Other (See Comments)    Stevens-Johnson syndrome and Brown urine, also  . Other Other (See Comments) and Cough    Cat dander  . Topiramate Rash and Other (See  Comments)    Nightmares, brown urine also  . Zetia [Ezetimibe]     Joint pain, weakness     Social History:   Social History   Socioeconomic History  . Marital status: Single    Spouse name: Not on file  . Number of children: Not on file  . Years of education: Not on file  . Highest education level: Not on file  Occupational History  . Not on file  Tobacco Use  . Smoking status: Never Smoker  . Smokeless tobacco: Never Used  Substance and Sexual Activity  . Alcohol use: Yes    Alcohol/week: 2.0 standard drinks    Types: 2 Standard drinks or equivalent per week    Comment: rarely  . Drug use: No  . Sexual activity: Not on file  Other Topics Concern  . Not on file  Social History Narrative  . Not on file   Social Determinants of Health   Financial Resource Strain:   . Difficulty of Paying Living Expenses:   Food Insecurity:   . Worried About Programme researcher, broadcasting/film/video in the Last Year:   . Barista in the Last Year:   Transportation Needs:   . Freight forwarder (Medical):   [name] Lack of Transportation (Non-Medical):   Physical Activity:   . Days of Exercise per Week:   . Minutes of Exercise per Session:   Stress:   . Feeling of Stress :   Social Connections:   . Frequency of Communication with Friends and Family:   . Frequency of Social Gatherings with Friends and Family:   . Attends Religious Services:   . Active Member of Clubs or Organizations:   . Attends Banker Meetings:   [name] Marital Status:   Intimate Partner Violence:   . Fear of Current or Ex-Partner:   . Emotionally Abused:   [name] Physically Abused:   . Sexually Abused:     Family History:   Family History  Problem Relation Age of Onset  . Vascular Disease Mother   . Dementia Mother   . Heart attack Maternal Grandmother   . Heart attack Maternal Grandfather   . Kidney failure Paternal Grandmother   . Heart attack Paternal Grandfather      ROS:  Please see the history of  present illness.  All other ROS reviewed and negative.     Physical Exam/Data:   Vitals:   12/18/19 1100 12/18/19 1216 12/18/19 1230 12/18/19 1245  BP: 111/77 126/65 127/69 135/74  Pulse: 93 100 95 91  Resp: 16 12 17 16   Temp:      TempSrc:      SpO2: 97% 98% 97% 99%    Intake/Output Summary (Last 24 hours) at 12/18/2019 1337 Last data filed at 12/18/2019 0252 Gross per 24 hour  Intake 2000 ml  Output --  Net 2000 ml   Last 3 Weights 09/21/2019 05/03/2019 03/22/2019  Weight (lbs) 185 lb 6.4 oz 184 lb 180 lb  Weight (kg) 84.097 kg 83.462 kg 81.647 kg     There is no height or weight on file to calculate BMI.  General:  Well nourished, well developed, in no acute distress HEENT: normal Lymph: no adenopathy Neck: no JVD Endocrine:  No thryomegaly Vascular: No carotid bruits; FA pulses 2+ bilaterally without bruits  Cardiac:  normal S1, S2; RRR; + murmur  Lungs:  clear to auscultation bilaterally, no wheezing, rhonchi or rales  Abd: soft, nontender, no hepatomegaly  Ext: no edema Musculoskeletal:  No deformities, BUE and BLE strength normal and equal Skin: warm and dry  Neuro:  CNs 2-12 intact, no focal abnormalities noted Psych:  Normal affect   EKG:  The EKG was personally reviewed and demonstrates:  Sinus tach with LBBB, Qtc 544ms.  Telemetry:  Telemetry was personally reviewed and demonstrates:  NSR with HR in the 80-90s, possible prolonged Qtc  Relevant CV Studies:  Echo ordered  Cardiac cath 10/09/2018  Ost 2nd Diag to 2nd Diag lesion is 30% stenosed.  Prox RCA lesion is 20% stenosed.  Dist RCA lesion is 50% stenosed.  Ost 1st Diag lesion is 90% stenosed.  Prox Cx lesion is 10% stenosed.  1st Diag lesion is 75% stenosed.  A drug-eluting stent was successfully placed using a STENT SYNERGY DES 2.25X16. A drug-eluting stent was successfully placed using a STENT SYNERGY DES 2.25X8 for distal edge dissection.  Post intervention, there is a 0% residual  stenosis.  The left ventricular systolic function is normal.  LV end diastolic pressure is normal.  The left ventricular ejection fraction is 55-65% by visual estimate.  There is no aortic valve stenosis.   DAPT for 6 months along with aggressive secondary prevention.    Plan for same day PCI.     Laboratory Data:  High Sensitivity Troponin:   Recent Labs  Lab 12/17/19 2338 12/18/19 0215 12/18/19 0520  TROPONINIHS 4 6 5      Chemistry Recent Labs  Lab 12/17/19 2338 12/18/19 0520  NA 143 142  K 4.0 3.8  CL 109 112*  CO2 21* 20*  GLUCOSE 97 117*  BUN 15 16  CREATININE 1.47* 1.22*  CALCIUM 9.2 8.3*  GFRNONAA 37* 46*  GFRAA 43* 53*  ANIONGAP 13 10    Recent Labs  Lab 12/17/19 2338 12/18/19 0520  PROT 7.1 5.6*  ALBUMIN 4.1 3.3*  AST 63* 45*  ALT 58* 44  ALKPHOS 80 63  BILITOT 0.6 0.4   Hematology Recent Labs  Lab 12/17/19 2338  WBC 7.2  RBC 3.98  HGB 12.9  HCT 39.1  MCV 98.2  MCH 32.4  MCHC 33.0  RDW 14.0  PLT 303   BNP Recent Labs  Lab 12/18/19 0216  BNP 35.3    DDimer  Recent Labs  Lab  12/17/19 2338  DDIMER 1.63*     Radiology/Studies:  DG Lumbar Spine Complete  Result Date: 12/18/2019 CLINICAL DATA:  Syncope, fall. EXAM: LUMBAR SPINE - COMPLETE 4+ VIEW COMPARISON:  Lumbar radiograph 12/05/2019 FINDINGS: Mild broad-based levo scoliotic curvature which may be positional. Previous anterolisthesis of L4 on L5 is no longer seen. No acute fracture. Vertebral body heights are preserved. Mild multilevel degenerative disc disease most prominent at L2-L3. There is prominent facet hypertrophy at multiple levels. Sacroiliac joints are congruent. Stable coarse calcification in the left pelvis, presumed fibroid. IMPRESSION: 1. No acute fracture of the lumbar spine. 2. Multilevel degenerative change, grossly stable from prior. Electronically Signed   By: Narda Rutherford M.D.   On: 12/18/2019 00:57   DG Pelvis 1-2 Views  Result Date:  12/18/2019 CLINICAL DATA:  Post fall.  Syncope.  Right hip pain. EXAM: PELVIS - 1-2 VIEW COMPARISON:  Pelvis radiograph 12/05/2019 FINDINGS: The cortical margins of the bony pelvis are intact. No fracture. Pubic symphysis and sacroiliac joints are congruent. Coarse calcification again seen in the left pelvis. Both femoral heads are well-seated in the respective acetabula. IMPRESSION: No pelvic fracture. Electronically Signed   By: Narda Rutherford M.D.   On: 12/18/2019 00:58   DG Sacrum/Coccyx  Result Date: 12/18/2019 CLINICAL DATA:  Syncope, pelvic pain. EXAM: SACRUM AND COCCYX - 2+ VIEW COMPARISON:  Sacrum radiograph 12/05/2019 FINDINGS: Cortical irregularity involving the sacrococcygeal junction that was not seen on prior exam and suspicious for nondisplaced fracture. The sacral ala are maintained. The sacroiliac joints are congruent. Coarse calcification in the left pelvis again seen. IMPRESSION: Cortical irregularity involving the sacrococcygeal junction, not seen on recent prior exam and suspicious for nondisplaced fracture. Electronically Signed   By: Narda Rutherford M.D.   On: 12/18/2019 00:59   CT Head Wo Contrast  Result Date: 12/18/2019 CLINICAL DATA:  Syncope, recurrent. Fall at home. Loss of consciousness. Laceration to back of head. EXAM: CT HEAD WITHOUT CONTRAST TECHNIQUE: Contiguous axial images were obtained from the base of the skull through the vertex without intravenous contrast. COMPARISON:  Head CT 12/05/2019 FINDINGS: Brain: No intracranial hemorrhage, mass effect, or midline shift. Age related atrophy. No hydrocephalus. The basilar cisterns are patent. No evidence of territorial infarct or acute ischemia. Minor chronic small vessel ischemia. No extra-axial or intracranial fluid collection. Vascular: Atherosclerosis of skullbase vasculature without hyperdense vessel or abnormal calcification. Skull: No fracture or focal lesion. Sinuses/Orbits: Paranasal sinuses and mastoid air cells  are clear. The visualized orbits are unremarkable. Other: None. IMPRESSION: 1. No acute intracranial abnormality. No skull fracture. 2. Age related atrophy and minimal chronic small vessel ischemia. Electronically Signed   By: Narda Rutherford M.D.   On: 12/18/2019 01:10   CT Angio Chest PE W/Cm &/Or Wo Cm  Result Date: 12/18/2019 CLINICAL DATA:  Recurrent syncope EXAM: CT ANGIOGRAPHY CHEST WITH CONTRAST TECHNIQUE: Multidetector CT imaging of the chest was performed using the standard protocol during bolus administration of intravenous contrast. Multiplanar CT image reconstructions and MIPs were obtained to evaluate the vascular anatomy. CONTRAST:  75mL OMNIPAQUE IOHEXOL 350 MG/ML SOLN COMPARISON:  None. FINDINGS: Cardiovascular: Contrast injection is sufficient to demonstrate satisfactory opacification of the pulmonary arteries to the segmental level. There is no pulmonary embolus or evidence of right heart strain. The size of the main pulmonary artery is normal. Heart size is normal, with no pericardial effusion. The course and caliber of the aorta are normal. There is no atherosclerotic calcification. Opacification decreased due to pulmonary arterial  phase contrast bolus timing. Mediastinum/Nodes: No mediastinal, hilar or axillary lymphadenopathy. Normal visualized thyroid. Small hiatal hernia. Lungs/Pleura: Airways are patent. No pleural effusion, lobar consolidation, pneumothorax or pulmonary infarction. Upper Abdomen: Contrast bolus timing is not optimized for evaluation of the abdominal organs. The visualized portions of the organs of the upper abdomen are normal. Musculoskeletal: No chest wall abnormality. No bony spinal canal stenosis. Review of the MIP images confirms the above findings. IMPRESSION: 1. No pulmonary embolus or other acute thoracic abnormality. 2. Small hiatal hernia. Electronically Signed   By: Deatra Robinson M.D.   On: 12/18/2019 02:46   CT Cervical Spine Wo Contrast  Result Date:  12/18/2019 CLINICAL DATA:  Syncope, fall at home. Positive loss of consciousness. Laceration to back of head. EXAM: CT CERVICAL SPINE WITHOUT CONTRAST TECHNIQUE: Multidetector CT imaging of the cervical spine was performed without intravenous contrast. Multiplanar CT image reconstructions were also generated. COMPARISON:  CT 12/05/2019 FINDINGS: Alignment: Straightening of normal lordosis. No traumatic subluxation. Skull base and vertebrae: No acute fracture. Vertebral body heights are maintained. The dens and skull base are intact. Soft tissues and spinal canal: No prevertebral fluid or swelling. No visible canal hematoma. Disc levels: Unchanged degenerative disc disease and facet hypertrophy. Disc space narrowing and endplate spurring most prominent at C5-C6. Upper chest: No acute findings. Other: Mild carotid calcifications. IMPRESSION: 1. No acute fracture or subluxation of the cervical spine. 2. Unchanged degenerative disc disease and facet hypertrophy. Electronically Signed   By: Narda Rutherford M.D.   On: 12/18/2019 01:14   DG Chest Portable 1 View  Result Date: 12/18/2019 CLINICAL DATA:  Syncope. EXAM: PORTABLE CHEST 1 VIEW COMPARISON:  Radiograph 12/05/2019 FINDINGS: Upper normal heart size with unchanged mediastinal contours. Lower lung volumes from prior exam. No pneumothorax, large pleural effusion, focal airspace disease or pulmonary edema. No acute osseous abnormalities are seen. IMPRESSION: Lower lung volumes from prior exam without acute abnormality. Electronically Signed   By: Narda Rutherford M.D.   On: 12/18/2019 00:55   DG Femur Min 2 Views Right  Result Date: 12/18/2019 CLINICAL DATA:  Syncope. Right femur pain. EXAM: RIGHT FEMUR 2 VIEWS COMPARISON:  None. FINDINGS: Cortical margins of the femur are intact. There is no evidence of fracture or other focal bone lesions. Soft tissues are unremarkable. Mild degenerative change of the hip and knee. IMPRESSION: No fracture or acute  abnormality of the right femur. Electronically Signed   By: Narda Rutherford M.D.   On: 12/18/2019 00:54   {  Assessment and Plan:   Syncope/Lightheadedness with fall - Etiology uncertain. Appears to be more orthostatic given lightheadedness worse on standing and bending. Also has associated palpitations at times - H/o of alcohol use. She drinks 3 glasses of wine ont he weekends - hypotension improved with IVF. Antihypertensives held on admission - orthostatic vital signs show change in diastolic from 84 at sitting to 60 at standing.  - tele with no afib - HS troponin negative - Echo ordered - UA with UTI - Can consider heart monitor at discharge if palpitations continue.   Chest pain/CAD s/p DESx2 in 09/2018 - intermittent chest discomfort - Possible h/o of noncompliance with medications - HS troponin 4>6>5 - EKG with no ischemic changes - Plavix was continued on admission - Can consider Myoview stress test to investigate for changes  Lactic acidosis - improving with IVF - CXR with no PNA - UA with UTI - BC pending - per IM  AKI - creatinine 1.47 on admission.  -  improving with IVF  HTN - antihypertensives held on admission - patient was on Losartan 100 mg daily, coreg 12.5mg  BID, and amlodipine 2.5 - pressures stable - Slowly restart back if pressures are elevated.   HLD - H/o of intolerance to statins and Zetia - consider Lipid clinic referral  Paroxysmal Afib - Remains in sinus - not on a/c due to GIB  OSA - noncompliant - awaiting different mask   For questions or updates, please contact [facility] Please consult [email or web address] for contact info under     Signed, Marke Goodwyn David Stall, PA-C  12/18/2019 1:37 PM

## 2019-12-18 NOTE — Progress Notes (Signed)
Echocardiogram 2D Echocardiogram has been performed.  Lindsay [name] 12/18/2019, 3:30 PM

## 2019-12-18 NOTE — ED Notes (Signed)
Tele  Breakfast Ordered 

## 2019-12-19 DIAGNOSIS — I951 Orthostatic hypotension: Secondary | ICD-10-CM

## 2019-12-19 DIAGNOSIS — R55 Syncope and collapse: Secondary | ICD-10-CM

## 2019-12-19 DIAGNOSIS — I480 Paroxysmal atrial fibrillation: Secondary | ICD-10-CM

## 2019-12-19 DIAGNOSIS — I10 Essential (primary) hypertension: Secondary | ICD-10-CM

## 2019-12-19 DIAGNOSIS — E782 Mixed hyperlipidemia: Secondary | ICD-10-CM

## 2019-12-19 DIAGNOSIS — I2510 Atherosclerotic heart disease of native coronary artery without angina pectoris: Secondary | ICD-10-CM

## 2019-12-19 LAB — BASIC METABOLIC PANEL
Anion gap: 5 (ref 5–15)
BUN: 17 mg/dL (ref 8–23)
CO2: 25 mmol/L (ref 22–32)
Calcium: 9 mg/dL (ref 8.9–10.3)
Chloride: 108 mmol/L (ref 98–111)
Creatinine, Ser: 0.81 mg/dL (ref 0.44–1.00)
GFR calc Af Amer: >60 mL/min (ref >60)
GFR calc non Af Amer: >60 mL/min (ref >60)
Glucose, Bld: 100 mg/dL — ABNORMAL HIGH (ref 70–99)
Potassium: 3.9 mmol/L (ref 3.5–5.1)
Sodium: 138 mmol/L (ref 135–145)

## 2019-12-19 LAB — CBC WITH DIFFERENTIAL/PLATELET
Abs Immature Granulocytes: 0 10*3/uL (ref 0.00–0.07)
Basophils Absolute: 0 10*3/uL (ref 0.0–0.1)
Basophils Relative: 1 %
Eosinophils Absolute: 0 10*3/uL (ref 0.0–0.5)
Eosinophils Relative: 0 %
HCT: 31.2 % — ABNORMAL LOW (ref 36.0–46.0)
Hemoglobin: 10.4 g/dL — ABNORMAL LOW (ref 12.0–15.0)
Immature Granulocytes: 0 %
Lymphocytes Relative: 28 %
Lymphs Abs: 1.4 10*3/uL (ref 0.7–4.0)
MCH: 32.1 pg (ref 26.0–34.0)
MCHC: 33.3 g/dL (ref 30.0–36.0)
MCV: 96.3 fL (ref 80.0–100.0)
Monocytes Absolute: 0.5 10*3/uL (ref 0.1–1.0)
Monocytes Relative: 10 %
Neutro Abs: 3.1 10*3/uL (ref 1.7–7.7)
Neutrophils Relative %: 61 %
Platelets: 212 10*3/uL (ref 150–400)
RBC: 3.24 MIL/uL — ABNORMAL LOW (ref 3.87–5.11)
RDW: 13.7 % (ref 11.5–15.5)
WBC: 5 10*3/uL (ref 4.0–10.5)
nRBC: 0 % (ref 0.0–0.2)

## 2019-12-19 LAB — MAGNESIUM: Magnesium: 1.9 mg/dL (ref 1.7–2.4)

## 2019-12-19 MED ORDER — ACETAMINOPHEN 325 MG PO TABS: 650.0000 mg | ORAL_TABLET | Freq: Four times a day (QID) | ORAL | Status: DC | PRN

## 2019-12-19 MED ORDER — AMLODIPINE BESYLATE 2.5 MG PO TABS
2.5000 mg | ORAL_TABLET | Freq: Every day | ORAL | Status: DC
  Filled 2019-12-19: qty 1

## 2019-12-19 MED ORDER — AMOXICILLIN-POT CLAVULANATE 875-125 MG PO TABS: 1.0000 | ORAL_TABLET | Freq: Two times a day (BID) | ORAL | 0 refills | Status: AC

## 2019-12-19 MED ORDER — AMOXICILLIN-POT CLAVULANATE 875-125 MG PO TABS
1.0000 | ORAL_TABLET | Freq: Two times a day (BID) | ORAL | Status: DC
  Administered 2019-12-19: 1 via ORAL
  Filled 2019-12-19: qty 1

## 2019-12-19 MED ORDER — METOPROLOL TARTRATE 25 MG PO TABS: 12.5000 mg | ORAL_TABLET | Freq: Two times a day (BID) | ORAL | Status: DC

## 2019-12-19 MED ORDER — ESOMEPRAZOLE MAGNESIUM 20 MG PO CPDR: 20.0000 mg | DELAYED_RELEASE_CAPSULE | Freq: Every day | ORAL | 1 refills | Status: DC

## 2019-12-19 MED ORDER — LOSARTAN POTASSIUM 50 MG PO TABS: 50.0000 mg | ORAL_TABLET | Freq: Every day | ORAL | 1 refills | Status: DC

## 2019-12-19 MED ORDER — LOSARTAN POTASSIUM 50 MG PO TABS
50.0000 mg | ORAL_TABLET | Freq: Every day | ORAL | Status: DC
  Administered 2019-12-19: 50 mg via ORAL
  Filled 2019-12-19: qty 1

## 2019-12-19 NOTE — Evaluation (Signed)
Physical Therapy Evaluation Patient Details Name: Lindsay [name] MRN: [id number] DOB: 1956/06/21 Today's Date: 12/19/2019   History of Present Illness  64 year old female with past medical history of hyperlipidemia, hypertension, gastroesophageal reflux disease, coronary artery disease (status post MI in 2010, last cath 09/2018 with multivessel disease S/P 2 DES) [facility] emergency department with complaints of frequent bouts of lightheadedness with an episode of loss of consciousness. Pt experiencing hypotension and bout of syncope in the ED. Admitted 12/17/19 for treatment of syncope. chest pain and hematemesis.   Clinical Impression  Patient evaluated by Physical Therapy with no further acute PT needs identified. All education has been completed and the patient has no further questions. Pt would benefit from outpatient PT however can no afford it at this time, pt reports working with a Systems analyst prior to COVID, recommended that she return to regular physical activity. PT is signing off. Thank you for this referral.     Follow Up Recommendations No PT follow up;Supervision - Intermittent    Equipment Recommendations  None recommended by PT    Recommendations for Other Services       Precautions / Restrictions Precautions Precautions: Fall Restrictions Weight Bearing Restrictions: No      Mobility  Bed Mobility Overal bed mobility: Modified Independent             General bed mobility comments: use of bed rail to come to seated  Transfers Overall transfer level: Modified independent Equipment used: None             General transfer comment: mod I for increased effort to stand from bed and toilet,   Ambulation/Gait Ambulation/Gait assistance: Supervision Gait Distance (Feet): 600 Feet   Gait Pattern/deviations: Step-through pattern;Decreased step length - right;Decreased step length - left Gait velocity: too fast for situation, especially with  turns Gait velocity interpretation: >2.62 ft/sec, indicative of community ambulatory General Gait Details: supervision for ambulation due to decreased safety awareness and knowledge of deficits  Stairs Stairs: Yes Stairs assistance: Supervision Stair Management: One rail Right;Alternating pattern;Forwards Number of Stairs: 20 General stair comments: supervision for safety        Balance Overall balance assessment: Needs assistance Sitting-balance support: Feet supported;No upper extremity supported Sitting balance-Leahy Scale: Good Sitting balance - Comments: limited by coccyx pain  Postural control: Left lateral lean Standing balance support: No upper extremity supported;During functional activity Standing balance-Leahy Scale: Good                               Pertinent Vitals/Pain Pain Assessment: 0-10 Pain Score: 2  Pain Location: coccyx, R hip Pain Descriptors / Indicators: Sharp;Shooting Pain Intervention(s): Limited activity within patient's tolerance;Monitored during session;Repositioned    Home Living Family/patient expects to be discharged to:: Private residence Living Arrangements: Alone Available Help at Discharge: Friend(s);Available PRN/intermittently Type of Home: House Home Access: Stairs to enter   Entrance Stairs-Number of Steps: 3 Home Layout: Multi-level;Bed/bath upstairs;Laundry or work area in [employer]: None      Prior Function Level of Independence: Independent                  Extremity/Trunk Assessment   Upper Extremity Assessment Upper Extremity Assessment: LUE deficits/detail LUE Deficits / Details: shoulder flexion abduction limited LUE Sensation: (occasional paresthesia due to crushed vertebrae)    Lower Extremity Assessment Lower Extremity Assessment: RLE deficits/detail;LLE deficits/detail RLE Deficits / Details: R hip edema, with increased pain  from fall, hip, knee and ankle ROM WFL, painful  knee motion, strength grossly 4/5 LLE Deficits / Details: hip, knee and ankle ROM WFL, knee pain with movement, strength grossly 4/5        Communication   Communication: No difficulties  Cognition Arousal/Alertness: Awake/alert Behavior During Therapy: WFL for tasks assessed/performed Overall Cognitive Status: Impaired/Different from baseline Area of Impairment: Safety/judgement;Following commands                       Following Commands: Follows one step commands inconsistently;Follows multi-step commands inconsistently(talks over therapist, and requires additional cuing for safe) Safety/Judgement: Decreased awareness of safety;Decreased awareness of deficits     General Comments: Pt is very loquacious, requiring redirecting to task at hand, moves too quickly for situation with decreased safety awareness      General Comments General comments (skin integrity, edema, etc.): VSS on RA, no compaints of dizziness or lightheadedness         Assessment/Plan    PT Assessment Patent does not need any further PT services  PT Problem List         PT Treatment Interventions      PT Goals (Current goals can be found in the Care Plan section)  Acute Rehab PT Goals Patient Stated Goal: get stronger PT Goal Formulation: With patient     AM-PAC PT "6 Clicks" Mobility  Outcome Measure Help needed turning from your back to your side while in a flat bed without using bedrails?: None Help needed moving from lying on your back to sitting on the side of a flat bed without using bedrails?: None Help needed moving to and from a bed to a chair (including a wheelchair)?: None Help needed standing up from a chair using your arms (e.g., wheelchair or bedside chair)?: None Help needed to walk in hospital room?: None Help needed climbing 3-5 steps with a railing? : None 6 Click Score: 24    End of Session Equipment Utilized During Treatment: Gait belt Activity Tolerance: Patient  tolerated treatment well Patient left: in bed;with call bell/phone within reach Nurse Communication: Mobility status;Other (comment)(MD assessment of contusion to R hip) PT Visit Diagnosis: Unsteadiness on feet (R26.81);Pain Pain - Right/Left: Right Pain - part of body: Hip(coccyx )    Time: 1447-1530 PT Time Calculation (min) (ACUTE ONLY): 43 min   Charges:   PT Evaluation $PT Eval Moderate Complexity: 1 Mod PT Treatments $Gait Training: 8-22 mins $Therapeutic Activity: 8-22 mins        Taneesha Edgin B. Migdalia Dk PT, DPT Acute Rehabilitation Services Pager [phone] Office [phone]   [place] 12/19/2019, 4:01 PM

## 2019-12-19 NOTE — Plan of Care (Signed)
  Problem: Education: Goal: Knowledge of General Education information will improve Description: Including pain rating scale, medication(s)/side effects and non-pharmacologic comfort measures Outcome: Adequate for Discharge   

## 2019-12-19 NOTE — Progress Notes (Signed)
Progress Note  Patient Name: Lindsay [name] Date of Encounter: 12/19/2019  Primary Cardiologist: Chilton Si, MD   Subjective   Feeling much better.  No lightheadedness with orthostatics.  She ambulated around the room without difficulty.  Inpatient Medications    Scheduled Meds:  amoxicillin-clavulanate  1 tablet Oral Q12H   clopidogrel  75 mg Oral Daily   metoprolol tartrate  12.5 mg Oral BID   pantoprazole  40 mg Oral Daily   Continuous Infusions:  PRN Meds: acetaminophen **OR** acetaminophen, hydrOXYzine, nitroGLYCERIN, ondansetron **OR** ondansetron (ZOFRAN) IV, oxyCODONE-acetaminophen, polyethylene glycol   Vital Signs    Vitals:   12/18/19 2123 12/19/19 0107 12/19/19 0505 12/19/19 0808  BP: 138/79 (!) 146/75 (!) 154/97   Pulse: 80 75 74 84  Resp:  16 15 18   Temp:  98.8 F (37.1 C) 98.8 F (37.1 C) 98 F (36.7 C)  TempSrc:  Oral Oral Oral  SpO2:  98% 100% 100%  Weight:   86.2 kg     Intake/Output Summary (Last 24 hours) at 12/19/2019 0836 Last data filed at 12/19/2019 0806 Gross per 24 hour  Intake 1208.19 ml  Output 2600 ml  Net -1391.81 ml   Last 3 Weights 12/19/2019 12/18/2019 09/21/2019  Weight (lbs) 190 lb 194 lb 14.4 oz 185 lb 6.4 oz  Weight (kg) 86.183 kg 88.406 kg 84.097 kg      Telemetry    Sinus rhythm.  No events.- Personally Reviewed  ECG    N/A- Personally Reviewed  Physical Exam   VS:  BP (!) 154/97 (BP Location: Left Arm)    Pulse 84    Temp 98 F (36.7 C) (Oral)    Resp 18    Wt 86.2 kg    SpO2 100%    BMI 34.75 kg/m  , BMI Body mass index is 34.75 kg/m. GENERAL:  Well appearing HEENT: Pupils equal round and reactive, fundi not visualized, oral mucosa unremarkable NECK:  No jugular venous distention, waveform within normal limits, carotid upstroke brisk and symmetric, no bruits LUNGS:  Clear to auscultation bilaterally HEART:  RRR.  PMI not displaced or sustained,S1 and S2 within normal limits, no S3, no S4, no clicks, no  rubs, no murmurs ABD:  Flat, positive bowel sounds normal in frequency in pitch, no bruits, no rebound, no guarding, no midline pulsatile mass, no hepatomegaly, no splenomegaly EXT:  2 plus pulses throughout, no edema, no cyanosis no clubbing SKIN:  No rashes no nodules NEURO:  Cranial nerves II through XII grossly intact, motor grossly intact throughout PSYCH:  Cognitively intact, oriented to person place and time   Labs    High Sensitivity Troponin:   Recent Labs  Lab 12/17/19 2338 12/18/19 0215 12/18/19 0520  TROPONINIHS 4 6 5       Chemistry Recent Labs  Lab 12/17/19 2338 12/18/19 0520  NA 143 142  K 4.0 3.8  CL 109 112*  CO2 21* 20*  GLUCOSE 97 117*  BUN 15 16  CREATININE 1.47* 1.22*  CALCIUM 9.2 8.3*  PROT 7.1 5.6*  ALBUMIN 4.1 3.3*  AST 63* 45*  ALT 58* 44  ALKPHOS 80 63  BILITOT 0.6 0.4  GFRNONAA 37* 46*  GFRAA 43* 53*  ANIONGAP 13 10     Hematology Recent Labs  Lab 12/17/19 2338 12/19/19 0508  WBC 7.2 5.0  RBC 3.98 3.24*  HGB 12.9 10.4*  HCT 39.1 31.2*  MCV 98.2 96.3  MCH 32.4 32.1  MCHC 33.0 33.3  RDW 14.0 13.7  PLT 303 212    BNP Recent Labs  Lab 12/18/19 0216  BNP 35.3     DDimer  Recent Labs  Lab 12/17/19 2338  DDIMER 1.63*     Radiology    DG Lumbar Spine Complete  Result Date: 12/18/2019 CLINICAL DATA:  Syncope, fall. EXAM: LUMBAR SPINE - COMPLETE 4+ VIEW COMPARISON:  Lumbar radiograph 12/05/2019 FINDINGS: Mild broad-based levo scoliotic curvature which may be positional. Previous anterolisthesis of L4 on L5 is no longer seen. No acute fracture. Vertebral body heights are preserved. Mild multilevel degenerative disc disease most prominent at L2-L3. There is prominent facet hypertrophy at multiple levels. Sacroiliac joints are congruent. Stable coarse calcification in the left pelvis, presumed fibroid. IMPRESSION: 1. No acute fracture of the lumbar spine. 2. Multilevel degenerative change, grossly stable from prior.  Electronically Signed   By: Narda Rutherford M.D.   On: 12/18/2019 00:57   DG Pelvis 1-2 Views  Result Date: 12/18/2019 CLINICAL DATA:  Post fall.  Syncope.  Right hip pain. EXAM: PELVIS - 1-2 VIEW COMPARISON:  Pelvis radiograph 12/05/2019 FINDINGS: The cortical margins of the bony pelvis are intact. No fracture. Pubic symphysis and sacroiliac joints are congruent. Coarse calcification again seen in the left pelvis. Both femoral heads are well-seated in the respective acetabula. IMPRESSION: No pelvic fracture. Electronically Signed   By: Narda Rutherford M.D.   On: 12/18/2019 00:58   DG Sacrum/Coccyx  Result Date: 12/18/2019 CLINICAL DATA:  Syncope, pelvic pain. EXAM: SACRUM AND COCCYX - 2+ VIEW COMPARISON:  Sacrum radiograph 12/05/2019 FINDINGS: Cortical irregularity involving the sacrococcygeal junction that was not seen on prior exam and suspicious for nondisplaced fracture. The sacral ala are maintained. The sacroiliac joints are congruent. Coarse calcification in the left pelvis again seen. IMPRESSION: Cortical irregularity involving the sacrococcygeal junction, not seen on recent prior exam and suspicious for nondisplaced fracture. Electronically Signed   By: Narda Rutherford M.D.   On: 12/18/2019 00:59   CT Head Wo Contrast  Result Date: 12/18/2019 CLINICAL DATA:  Syncope, recurrent. Fall at home. Loss of consciousness. Laceration to back of head. EXAM: CT HEAD WITHOUT CONTRAST TECHNIQUE: Contiguous axial images were obtained from the base of the skull through the vertex without intravenous contrast. COMPARISON:  Head CT 12/05/2019 FINDINGS: Brain: No intracranial hemorrhage, mass effect, or midline shift. Age related atrophy. No hydrocephalus. The basilar cisterns are patent. No evidence of territorial infarct or acute ischemia. Minor chronic small vessel ischemia. No extra-axial or intracranial fluid collection. Vascular: Atherosclerosis of skullbase vasculature without hyperdense vessel or  abnormal calcification. Skull: No fracture or focal lesion. Sinuses/Orbits: Paranasal sinuses and mastoid air cells are clear. The visualized orbits are unremarkable. Other: None. IMPRESSION: 1. No acute intracranial abnormality. No skull fracture. 2. Age related atrophy and minimal chronic small vessel ischemia. Electronically Signed   By: Narda Rutherford M.D.   On: 12/18/2019 01:10   CT Angio Chest PE W/Cm &/Or Wo Cm  Result Date: 12/18/2019 CLINICAL DATA:  Recurrent syncope EXAM: CT ANGIOGRAPHY CHEST WITH CONTRAST TECHNIQUE: Multidetector CT imaging of the chest was performed using the standard protocol during bolus administration of intravenous contrast. Multiplanar CT image reconstructions and MIPs were obtained to evaluate the vascular anatomy. CONTRAST:  64mL OMNIPAQUE IOHEXOL 350 MG/ML SOLN COMPARISON:  None. FINDINGS: Cardiovascular: Contrast injection is sufficient to demonstrate satisfactory opacification of the pulmonary arteries to the segmental level. There is no pulmonary embolus or evidence of right heart strain. The size of the main pulmonary artery is normal.  Heart size is normal, with no pericardial effusion. The course and caliber of the aorta are normal. There is no atherosclerotic calcification. Opacification decreased due to pulmonary arterial phase contrast bolus timing. Mediastinum/Nodes: No mediastinal, hilar or axillary lymphadenopathy. Normal visualized thyroid. Small hiatal hernia. Lungs/Pleura: Airways are patent. No pleural effusion, lobar consolidation, pneumothorax or pulmonary infarction. Upper Abdomen: Contrast bolus timing is not optimized for evaluation of the abdominal organs. The visualized portions of the organs of the upper abdomen are normal. Musculoskeletal: No chest wall abnormality. No bony spinal canal stenosis. Review of the MIP images confirms the above findings. IMPRESSION: 1. No pulmonary embolus or other acute thoracic abnormality. 2. Small hiatal hernia.  Electronically Signed   By: Deatra Robinson M.D.   On: 12/18/2019 02:46   CT Cervical Spine Wo Contrast  Result Date: 12/18/2019 CLINICAL DATA:  Syncope, fall at home. Positive loss of consciousness. Laceration to back of head. EXAM: CT CERVICAL SPINE WITHOUT CONTRAST TECHNIQUE: Multidetector CT imaging of the cervical spine was performed without intravenous contrast. Multiplanar CT image reconstructions were also generated. COMPARISON:  CT 12/05/2019 FINDINGS: Alignment: Straightening of normal lordosis. No traumatic subluxation. Skull base and vertebrae: No acute fracture. Vertebral body heights are maintained. The dens and skull base are intact. Soft tissues and spinal canal: No prevertebral fluid or swelling. No visible canal hematoma. Disc levels: Unchanged degenerative disc disease and facet hypertrophy. Disc space narrowing and endplate spurring most prominent at C5-C6. Upper chest: No acute findings. Other: Mild carotid calcifications. IMPRESSION: 1. No acute fracture or subluxation of the cervical spine. 2. Unchanged degenerative disc disease and facet hypertrophy. Electronically Signed   By: Narda Rutherford M.D.   On: 12/18/2019 01:14   DG Chest Portable 1 View  Result Date: 12/18/2019 CLINICAL DATA:  Syncope. EXAM: PORTABLE CHEST 1 VIEW COMPARISON:  Radiograph 12/05/2019 FINDINGS: Upper normal heart size with unchanged mediastinal contours. Lower lung volumes from prior exam. No pneumothorax, large pleural effusion, focal airspace disease or pulmonary edema. No acute osseous abnormalities are seen. IMPRESSION: Lower lung volumes from prior exam without acute abnormality. Electronically Signed   By: Narda Rutherford M.D.   On: 12/18/2019 00:55   ECHOCARDIOGRAM COMPLETE  Result Date: 12/18/2019    ECHOCARDIOGRAM REPORT   Patient Name:   MARWA FUHRMAN  Date of Exam: 12/18/2019 Medical Rec #:  [id number]  Height:       62.0 in Accession #:    [id number] Weight:       194.9 lb Date of Birth:  1956-01-21   BSA:          1.891 m Patient Age:    64 years   BP:           126/79 mmHg Patient Gender: F          HR:           90 bpm. Exam Location:  Inpatient Procedure: 2D Echo, Color Doppler and Cardiac Doppler Indications:    R55 Syncope  History:        Patient has no prior history of Echocardiogram examinations.                 CAD, Arrythmias:Atrial Fibrillation; Risk Factors:Hypertension                 and Dyslipidemia.  Sonographer:    Irving Burton Senior RDCS Referring Phys: [id number] Deno Lunger [place] IMPRESSIONS  1. Left ventricular ejection fraction, by estimation, is 50%. The left ventricle has mildly decreased function.  The left ventricle demonstrates regional wall motion abnormalities, septal-lateral dyssynchrony consistent with LBBB. Left ventricular diastolic parameters are consistent with Grade I diastolic dysfunction (impaired relaxation).  2. Right ventricular systolic function is normal. The right ventricular size is normal. Tricuspid regurgitation signal is inadequate for assessing PA pressure.  3. The mitral valve is normal in structure. No evidence of mitral valve regurgitation. No evidence of mitral stenosis.  4. The aortic valve is tricuspid. Aortic valve regurgitation is not visualized. Mild aortic valve sclerosis is present, with no evidence of aortic valve stenosis.  5. The inferior vena cava is normal in size with <50% respiratory variability, suggesting right atrial pressure of 8 mmHg. FINDINGS  Left Ventricle: Left ventricular ejection fraction, by estimation, is 50%. The left ventricle has mildly decreased function. The left ventricle demonstrates regional wall motion abnormalities. The left ventricular internal cavity size was normal in size. There is no left ventricular hypertrophy. Left ventricular diastolic parameters are consistent with Grade I diastolic dysfunction (impaired relaxation). Right Ventricle: The right ventricular size is normal. No increase in right ventricular wall thickness.  Right ventricular systolic function is normal. Tricuspid regurgitation signal is inadequate for assessing PA pressure. Left Atrium: Left atrial size was normal in size. Right Atrium: Right atrial size was normal in size. Pericardium: Trivial pericardial effusion is present. Mitral Valve: The mitral valve is normal in structure. No evidence of mitral valve regurgitation. No evidence of mitral valve stenosis. Tricuspid Valve: The tricuspid valve is normal in structure. Tricuspid valve regurgitation is not demonstrated. Aortic Valve: The aortic valve is tricuspid. Aortic valve regurgitation is not visualized. Mild aortic valve sclerosis is present, with no evidence of aortic valve stenosis. Pulmonic Valve: The pulmonic valve was normal in structure. Pulmonic valve regurgitation is not visualized. Aorta: The aortic root is normal in size and structure. Venous: The inferior vena cava is normal in size with less than 50% respiratory variability, suggesting right atrial pressure of 8 mmHg. IAS/Shunts: No atrial level shunt detected by color flow Doppler.  LEFT VENTRICLE PLAX 2D LVIDd:         4.60 cm  Diastology LVIDs:         3.20 cm  LV e' lateral:   7.72 cm/s LV PW:         1.40 cm  LV E/e' lateral: 11.1 LV IVS:        1.10 cm  LV e' medial:    7.29 cm/s LVOT diam:     1.90 cm  LV E/e' medial:  11.8 LV SV:         75 LV SV Index:   40 LVOT Area:     2.84 cm  RIGHT VENTRICLE RV S prime:     14.50 cm/s TAPSE (M-mode): 2.4 cm LEFT ATRIUM             Index       RIGHT ATRIUM           Index LA diam:        3.70 cm 1.96 cm/m  RA Area:     13.70 cm LA Vol (A2C):   59.3 ml 31.36 ml/m RA Volume:   35.20 ml  18.62 ml/m LA Vol (A4C):   37.2 ml 19.67 ml/m LA Biplane Vol: 50.0 ml 26.44 ml/m  AORTIC VALVE LVOT Vmax:   135.00 cm/s LVOT Vmean:  93.200 cm/s LVOT VTI:    0.264 m  AORTA Ao Root diam: 2.90 cm Ao Asc diam:  3.40 cm MITRAL  VALVE MV Area (PHT): 3.16 cm     SHUNTS MV Decel Time: 240 msec     Systemic VTI:  0.26 m MV E  velocity: 85.70 cm/s   Systemic Diam: 1.90 cm MV A velocity: 109.00 cm/s MV E/A ratio:  0.79 Marca Anconaalton Mclean MD Electronically signed by Marca Anconaalton Mclean MD Signature Date/Time: 12/18/2019/4:04:56 PM    Final    DG Femur Min 2 Views Right  Result Date: 12/18/2019 CLINICAL DATA:  Syncope. Right femur pain. EXAM: RIGHT FEMUR 2 VIEWS COMPARISON:  None. FINDINGS: Cortical margins of the femur are intact. There is no evidence of fracture or other focal bone lesions. Soft tissues are unremarkable. Mild degenerative change of the hip and knee. IMPRESSION: No fracture or acute abnormality of the right femur. Electronically Signed   By: Narda RutherfordMelanie  Sanford M.D.   On: 12/18/2019 00:54    Cardiac Studies   Echo 12/18/19: 1. Left ventricular ejection fraction, by estimation, is 50%. The left  ventricle has mildly decreased function. The left ventricle demonstrates  regional wall motion abnormalities, septal-lateral dyssynchrony consistent  with LBBB. Left ventricular  diastolic parameters are consistent with Grade I diastolic dysfunction  (impaired relaxation).  2. Right ventricular systolic function is normal. The right ventricular  size is normal. Tricuspid regurgitation signal is inadequate for assessing  PA pressure.  3. The mitral valve is normal in structure. No evidence of mitral valve  regurgitation. No evidence of mitral stenosis.  4. The aortic valve is tricuspid. Aortic valve regurgitation is not  visualized. Mild aortic valve sclerosis is present, with no evidence of  aortic valve stenosis.  5. The inferior vena cava is normal in size with <50% respiratory  variability, suggesting right atrial pressure of 8 mmHg.   Patient Profile     [name] is a [age]F with CAD s/p MI, PAF not on anticoagulation 2/2 GI bleed, OSA not on CPAP, prior EtOH abuse and depression admitted with syncope.    Assessment & Plan    # CAD s/p PCI: # Hyperlipidemia:   She underwent PCI 09/2018 for unstable angina.   She has had episodes of chest pain since then, but they are not exertional and seem more GI related.  She is on clopidogrel given her history of GI bleeding and intolerance of aspirin.  Continue home metoprolol.  Her lipids have been poorly controlled.  She has been unable to afford PCSK9 inhibitors in the past and has statin intolerance.  We will need to revisit patient assistance for a PCSK9 inhibitor or bempedoic acid.  She will need to see our pharmacist as an outpatient.  # Syncope: # Hypertension: BP is not elevated after holding her home antihypertensives.  She will add intravascular line depletion and syncope on admission.  After receiving IV fluids she is feeling much better.  We will resume losartan at 50 mg.  Continue metoprolol and amlodipine at her home doses.  If she is stable early afternoon she would be okay for discharge.  # UTI:  Will treat with augmentin given her sulfa and cipro allergies.  # Atrial fibrillation: Currently in sinus rhythm.  She is not on anticoagulation 2/2 history of GI bleeding,  Continue clopidogrel and metoprolol.      For questions or updates, please contact [facility] Please consult [email or web address] for contact info under        Signed, Chilton Siiffany Russell, MD  12/19/2019, 8:36 AM

## 2019-12-19 NOTE — Discharge Instructions (Signed)
Hypotension As your heart beats, it forces blood through your body. Hypotension, commonly called low blood pressure, is when the force of blood pumping through your arteries is too weak. Arteries are blood vessels that carry blood from the heart throughout the body. Depending on the cause and severity, hypotension may be harmless (benign) or may cause serious problems (be critical). When blood pressure is too low, you may not get enough blood to your brain or to the rest of your organs. This can cause weakness, light-headedness, rapid heartbeat, and fainting. What are the causes? This condition may be caused by:  Blood loss.  Loss of body fluids (dehydration).  Heart problems.  Hormone (endocrine) problems.  Pregnancy.  Severe infection.  Lack of certain nutrients.  Severe allergic reactions (anaphylaxis).  Certain medicines, such as blood pressure medicine or medicines that make the body lose excess fluids (diuretics). Sometimes, hypotension may be caused by not taking medicine as directed, such as taking too much of a certain medicine. What increases the risk? The following factors may make you more likely to develop this condition:  Age. Risk increases as you get older.  Conditions that affect the heart or the central nervous system.  Taking certain medicines, such as blood pressure medicine or diuretics.  Being pregnant. What are the signs or symptoms? Common symptoms of this condition include:  Weakness.  Light-headedness.  Dizziness.  Blurred vision.  Fatigue.  Rapid heartbeat.  Fainting, in severe cases. How is this diagnosed? This condition is diagnosed based on:  Your medical history.  Your symptoms.  Your blood pressure measurement. Your health care provider will check your blood pressure when you are: ? Lying down. ? Sitting. ? Standing. A blood pressure reading is recorded as two numbers, such as "120 over 80" (or 120/80). The first ("top")  number is called the systolic pressure. It is a measure of the pressure in your arteries as your heart beats. The second ("bottom") number is called the diastolic pressure. It is a measure of the pressure in your arteries when your heart relaxes between beats. Blood pressure is measured in a unit called mm Hg. Healthy blood pressure for most adults is 120/80. If your blood pressure is below 90/60, you may be diagnosed with hypotension. Other information or tests that may be used to diagnose hypotension include:  Your other vital signs, such as your heart rate and temperature.  Blood tests.  Tilt table test. For this test, you will be safely secured to a table that moves you from a lying position to an upright position. Your heart rhythm and blood pressure will be monitored during the test. How is this treated? Treatment for this condition may include:  Changing your diet. This may involve eating more salt (sodium) or drinking more water.  Taking medicines to raise your blood pressure.  Changing the dosage of certain medicines you are taking that might be lowering your blood pressure.  Wearing compression stockings. These stockings help to prevent blood clots and reduce swelling in your legs. In some cases, you may need to go to the hospital for:  Fluid replacement. This means you will receive fluids through an IV.  Blood replacement. This means you will receive donated blood through an IV (transfusion).  Treating an infection or heart problems, if this applies.  Monitoring. You may need to be monitored while medicines that you are taking wear off. Follow these instructions at home: Eating and drinking   Drink enough fluid to keep your  urine pale yellow.  Eat a healthy diet, and follow instructions from your health care provider about eating or drinking restrictions. A healthy diet includes: ? Fresh fruits and vegetables. ? Whole grains. ? Lean meats. ? Low-fat dairy  products.  Eat extra salt only as directed. Do not add extra salt to your diet unless your health care provider told you to do that.  Eat frequent, small meals.  Avoid standing up suddenly after eating. Medicines  Take over-the-counter and prescription medicines only as told by your health care provider. ? Follow instructions from your health care provider about changing the dosage of your current medicines, if this applies. ? Do not stop or adjust any of your medicines on your own. General instructions   Wear compression stockings as told by your health care provider.  Get up slowly from lying down or sitting positions. This gives your blood pressure a chance to adjust.  Avoid hot showers and excessive heat as directed by your health care provider.  Return to your normal activities as told by your health care provider. Ask your health care provider what activities are safe for you.  Do not use any products that contain nicotine or tobacco, such as cigarettes, e-cigarettes, and chewing tobacco. If you need help quitting, ask your health care provider.  Keep all follow-up visits as told by your health care provider. This is important. Contact a health care provider if you:  Vomit.  Have diarrhea.  Have a fever for more than 2-3 days.  Feel more thirsty than usual.  Feel weak and tired. Get help right away if you:  Have chest pain.  Have a fast or irregular heartbeat.  Develop numbness in any part of your body.  Cannot move your arms or your legs.  Have trouble speaking.  Become sweaty or feel light-headed.  Faint.  Feel short of breath.  Have trouble staying awake.  Feel confused. Summary  Hypotension is when the force of blood pumping through your arteries is too weak.  Hypotension may be harmless (benign) or may cause serious problems (be critical).  Treatment for this condition may include changing your diet, changing your medicines, and wearing  compression stockings.  In some cases, you may need to go to the hospital for fluid or blood replacement. This information is not intended to replace advice given to you by your health care provider. Make sure you discuss any questions you have with your health care provider. Document Revised: 01/05/2018 Document Reviewed: 01/05/2018 Elsevier Patient Education  2020 [employer].   Near-Syncope Near-syncope is when you suddenly feel like you might pass out (faint), but you do not actually lose consciousness. This may also be referred to as presyncope. During an episode of near-syncope, you may:  Feel dizzy, weak, or light-headed.  Feel nauseous.  See all white or all black in your field of vision, or see spots.  Have cold, clammy skin. This condition is caused by a sudden decrease in blood flow to the brain. This decrease can result from various causes, but most of those causes are not dangerous. However, near-syncope may be a sign of a serious medical problem, so it is important to seek medical care. Follow these instructions at home: Medicines  Take over-the-counter and prescription medicines only as told by your health care provider.  If you are taking blood pressure or heart medicine, get up slowly and take several minutes to sit and then stand. This can reduce dizziness. General instructions  Pay  attention to any changes in your symptoms.  Talk with your health care provider about your symptoms. You may need to have testing to understand the cause of your near-syncope.  If you start to feel like you might faint, lie down right away and raise (elevate) your feet above the level of your heart. Breathe deeply and steadily. Wait until all of the symptoms have passed.  Have someone stay with you until you feel stable.  Do not drive, use machinery, or play sports until your health care provider says it is okay.  Drink enough fluid to keep your urine pale yellow.  Keep all  follow-up visits as told by your health care provider. This is important. Get help right away if you:  Have a seizure.  Have unusual pain in your chest, abdomen, or back.  Faint once or repeatedly.  Have a severe headache.  Are bleeding from your mouth or rectum, or you have black or tarry stool.  Have a very fast or irregular heartbeat (palpitations).  Are confused.  Have trouble walking.  Have severe weakness.  Have vision problems. These symptoms may represent a serious problem that is an emergency. Do not wait to see if your symptoms will go away. Get medical help right away. Call your local emergency services (911 in the [place] Do not drive yourself to the hospital. Summary  Near-syncope is when you suddenly feel like you might pass out (faint), but you do not actually lose consciousness.  This condition is caused by a sudden decrease in blood flow to the brain. This decrease can result from various causes, but most of those causes are not dangerous.  Near-syncope may be a sign of a serious medical problem, so it is important to seek medical care. This information is not intended to replace advice given to you by your health care provider. Make sure you discuss any questions you have with your health care provider. Document Revised: 11/03/2018 Document Reviewed: 05/31/2018 Elsevier Patient Education  2020 [employer].   Syncope  Syncope refers to a condition in which a person temporarily loses consciousness. Syncope may also be called fainting or passing out. It is caused by a sudden decrease in blood flow to the brain. Even though most causes of syncope are not dangerous, syncope can be a sign of a serious medical problem. Your health care provider may do tests to find the reason why you are having syncope. Signs that you may be about to faint include:  Feeling dizzy or light-headed.  Feeling nauseous.  Seeing all white or all black in your field of  vision.  Having cold, clammy skin. If you faint, get medical help right away. Call your local emergency services (911 in the [place] Do not drive yourself to the hospital. Follow these instructions at home: Pay attention to any changes in your symptoms. Take these actions to stay safe and to help relieve your symptoms: Lifestyle  Do not drive, use machinery, or play sports until your health care provider says it is okay.  Do not drink alcohol.  Do not use any products that contain nicotine or tobacco, such as cigarettes and e-cigarettes. If you need help quitting, ask your health care provider.  Drink enough fluid to keep your urine pale yellow. General instructions  Take over-the-counter and prescription medicines only as told by your health care provider.  If you are taking blood pressure or heart medicine, get up slowly and take several minutes to sit and then stand.  This can reduce dizziness or light-headedness.  Have someone stay with you until you feel stable.  If you start to feel like you might faint, lie down right away and raise (elevate) your feet above the level of your heart. Breathe deeply and steadily. Wait until all the symptoms have passed.  Keep all follow-up visits as told by your health care provider. This is important. Get help right away if you:  Have a severe headache.  Faint once or repeatedly.  Have pain in your chest, abdomen, or back.  Have a very fast or irregular heartbeat (palpitations).  Have pain when you breathe.  Are bleeding from your mouth or rectum, or you have black or tarry stool.  Have a seizure.  Are confused.  Have trouble walking.  Have severe weakness.  Have vision problems. These symptoms may represent a serious problem that is an emergency. Do not wait to see if your symptoms will go away. Get medical help right away. Call your local emergency services (911 in the [place] Do not drive yourself to the  hospital. Summary  Syncope refers to a condition in which a person temporarily loses consciousness. It is caused by a sudden decrease in blood flow to the brain.  Signs that you may be about to faint include dizziness, feeling light-headed, feeling nauseous, sudden vision changes, or cold, clammy skin.  Although most causes of syncope are not dangerous, syncope can be a sign of a serious medical problem. If you faint, get medical help right away. This information is not intended to replace advice given to you by your health care provider. Make sure you discuss any questions you have with your health care provider. Document Revised: 06/24/2017 Document Reviewed: 06/20/2017 Elsevier Patient Education  2020 [employer].

## 2019-12-19 NOTE — Discharge Summary (Signed)
Physician Discharge Summary  Lindsay [name] EAV:[id number] DOB: Aug 01, 1955 DOA: 12/17/2019  PCP: [facility] @ [place]  Admit date: 12/17/2019 Discharge date: 12/19/2019  Time spent: 55 minutes  Recommendations for Outpatient Follow-up:  1. Follow-up with Edd Fabian, NP, cardiology 01/02/2020.  2. Follow-up with [facility] @ [place] in 2 weeks.  On follow-up patient will need a basic metabolic profile done to follow-up on electrolytes and renal function. 3. Follow-up with Dr. Chilton Si, cardiology.   Discharge Diagnoses:  Principal Problem:   Syncope Active Problems:   Essential hypertension   Mixed hyperlipidemia   Chest pain   GERD without esophagitis   PAF (paroxysmal atrial fibrillation) (HCC)   Coronary artery disease involving native coronary artery of native heart   Lactic acidosis   Hematemesis   Recurrent syncope   Orthostasis   Discharge Condition: Stable and improved  Diet recommendation: Heart healthy  Filed Weights   12/18/19 1421 12/19/19 0505  Weight: 88.4 kg 86.2 kg    History of present illness:  HPI per Dr. Leafy Half 64 year old female with past medical history of hyperlipidemia, hypertension, gastroesophageal reflux disease, coronary artery disease (status post MI in 2010, last cath 09/2018 with multivessel disease S/P 2 DES) [facility] emergency department with complaints of frequent bouts of lightheadedness with an episode of loss of consciousness.   Patient explains that for the past several weeks she has been experiencing episodes of lightheadedness.  Patient explains that these episodes of lightheadedness typically occur upon arising from a seated position and ambulating.  Patient symptoms continue to persist until the patient experienced an episode of loss of conscious at home on 5/12 prompting presentation to [facility] emergency department.  Patient was worked up and discharged home at that  time.  Since her discharge from the emergency department she continues to experience frequent bouts of severe lightheadedness, worsening in intensity, typically occurring upon standing or with ambulation.  Patient denies palpitations or chest pain prior to these episodes.  Patient denies any tonic-clonic seizure-like activity, biting of the tongue, self urination or self defecation with these episodes.  The recent changes to her home medications.  Patient denies any weight loss, night sweats, fevers nausea vomiting or diarrhea as of late.  Patient additionally complains of dyspnea on exertion.  Patient states that these episodes of dyspnea on exertion have been occurring over the past month and have been increasing in intensity.  Finally, patient has also complaining of intermittent bouts of chest discomfort.  Patient describes this chest discomfort as sharp in quality, mild to moderate intensity, located in the left chest and nonradiating.  Patient states that these episodes occur with and without exertion.  Patient is unable to identify any alleviating or exacerbating factors.  Prior to her presentation today the patient experienced yet another episode of suspected syncope.  Upon evaluation in the emergency department patient was found to be extremely hypotensive, resulting in a another episode of witnessed syncope by the nursing staff.  Patient was hydrated with 2 L of normal saline.  Troponin was found to be unremarkable.  Patient was found to also have an elevated lactate of 3.0.  Due to notably elevated D-dimer of 1.63 CT angiogram of the chest was performed and was negative for pulmonary embolism.  The hospitalist group was then called to assess patient for additional hospital.  The hospitalist group was then called to assess the patient for admission the hospital.   Hospital Course:  1 syncope  secondary to orthostasis Patient had presented with several episodes of syncope with a several week  history of lightheadedness and seem to be orthostatic in nature.  Patient noted on presentation to the ED to be hypotensive which improved with fluid resuscitation.  Patient also noted to have a concurrent lactic acidosis which improved with hydration.  Patient's antihypertensive medications were held.  Patient monitored on telemetry.  2D echo was obtained which showed EF of 50%, left ventricle demonstrated regional wall motion abnormalities, septal lateral dyssynchrony consistent with left bundle branch block.  Grade 1 diastolic dysfunction.  Normal right ventricular systolic function.  Patient had no further syncopal episodes with hydration of IV fluids.  Infectious work-up was consistent with a UTI and patient started on Augmentin.  Patient improved clinically.  Cardiology was consulted and followed the patient throughout the hospitalization.  Per cardiology patient underwent PCI 09/2018 for unstable angina.  Patient noted to have some episodes of chest pain however felt not exertional and seem more GI related.  Patient's losartan was resumed at half home dose of 50 mg daily.  Patient was maintained on home regimen of metoprolol and Norvasc per cardiology recommendations.  Orthostatics were repeated on day of discharge which were negative.  Patient ambulated in her room without any difficulty and no further lightheadedness or syncopal episodes.  It was felt per cardiology patient was stable for discharge with close outpatient follow-up.  Patient will follow up with cardiology and PCP in the outpatient setting.  2.  Hypertension Patient noted to be hypotensive on admission.  Patient's antihypertensive medications were held.  Patient hydrated with IV fluids with resolution of hypotension.  Patient was resumed back on home regimen of metoprolol and Norvasc.  Patient's losartan dose was decreased to half home dose of 50 mg daily.  Patient's blood pressure remained stable.  Outpatient follow-up.  3.   UTI Urinalysis which was done was worrisome for UTI.  Urine cultures were not done.  Patient was placed on Augmentin.  Patient will be discharged home on 3 more days of Augmentin to complete a course of antibiotic treatment.  Outpatient follow-up.  4.  Paroxysmal atrial fibrillation Patient noted to be in sinus rhythm during the hospitalization.  Patient was resumed back on home regimen of metoprolol for rate control.  Patient noted not to be on anticoagulation candidate secondary to history of GI bleed.  Patient was placed back on home regimen of Plavix.  Outpatient follow-up with cardiology.  5.  Coronary artery disease status post PCI/hyperlipidemia Remained stable.  2D echo which was done with EF of 50%, left ventricle demonstrated regional wall motion abnormalities, septal lateral dyssynchrony consistent with left bundle branch block.  Grade 1 diastolic dysfunction.  Normal right ventricular systolic function.  Patient noted per cardiology to have undergone PCI March 2020 for unstable angina.  Patient noted to have had some episodes of chest pain since then but felt not exertional and more GI related.  Cardiology.  Patient maintained on a PPI during the hospitalization.  Patient also noted to be on Plavix due to history of GI bleed and intolerance of aspirin.  Patient's home regimen metoprolol was resumed after hypotension had resolved.  Patient will need to follow-up with cardiology in the outpatient setting to revisit patient assistance for PCSK9 inhibitor or bempedoic acid per cardiology.  Outpatient follow-up with cardiology.  6.  Gastroesophageal reflux disease without esophagitis/history of intermittent hematemesis Patient did not have any bouts of hematemesis during the hospitalization.  H&H remained  stable.  Patient maintained on a PPI.  Outpatient follow-up with PCP.  7.  Lactic acidosis Secondary to volume depletion.  Resolved with hydration.   Procedures:  CT angiogram chest  12/18/2019  CT head CT C-spine 12/18/2019  Chest x-ray 12/18/2019  Plain films right femur 12/18/2019  Plain films of the L-spine 12/18/2019  Plain films of the pelvis 12/18/2019  Plain films of the sacrum/coccyx 12/18/2019  2D echo 12/18/2019  Consultations:  Cardiology: Dr. Duke Salvia 12/18/2019  Discharge Exam: Vitals:   12/19/19 1035 12/19/19 1554  BP: 137/67 (!) 145/79  Pulse: 83 80  Resp:  18  Temp:  98.9 F (37.2 C)  SpO2:  98%    General: NAD Cardiovascular: RRR Respiratory: CTAB  Discharge Instructions   Discharge Instructions    Diet - low sodium heart healthy   Complete by: As directed    Increase activity slowly   Complete by: As directed      Allergies as of 12/19/2019      Reactions   Bee Venom Anaphylaxis   Lanolin Itching, Other (See Comments)   "Wool"   Sulfa Antibiotics Swelling, Rash   Ciprofloxacin Nausea And Vomiting   Depakote [divalproex Sodium] Other (See Comments)   Reaction not recalled- "it was 20 years ago"   Pine Itching   Risperidone Other (See Comments)   Unknown reaction   Seroquel [quetiapine Fumarate] Other (See Comments)   VIVID nightmares   Statins Other (See Comments)   joint pain, weakness   Tramadol Other (See Comments)   Serotonin syndrome   Citalopram Rash, Other (See Comments)   Insomnia and agitation also   Lamotrigine Itching, Rash, Other (See Comments)   Stevens-Johnson syndrome and Brown urine, also   Other Other (See Comments), Cough   Cat dander   Topiramate Rash, Other (See Comments)   Nightmares, brown urine also   Zetia [ezetimibe]    Joint pain, weakness       Medication List    TAKE these medications   acetaminophen 325 MG tablet Commonly known as: TYLENOL Take 2 tablets (650 mg total) by mouth every 6 (six) hours as needed for mild pain (or Fever >/= 101).   amLODipine 2.5 MG tablet Commonly known as: NORVASC Take 1 tablet (2.5 mg total) by mouth daily. What changed: when to take this    amoxicillin-clavulanate 875-125 MG tablet Commonly known as: AUGMENTIN Take 1 tablet by mouth every 12 (twelve) hours for 3 days.   b complex vitamins tablet Take 1 tablet by mouth daily.   B-12 PO Take 1 tablet by mouth 2 (two) times a week.   BIOTIN PO Take 1 tablet by mouth daily.   clopidogrel 75 MG tablet Commonly known as: PLAVIX TAKE ONE TABLET BY MOUTH ONE TIME DAILY   COQ-10 PO Take 1 capsule by mouth daily.   esomeprazole 20 MG capsule Commonly known as: NexIUM 24HR Clear Minis Take 1 capsule (20 mg total) by mouth daily before breakfast.   hydrOXYzine 50 MG capsule Commonly known as: VISTARIL Take 50 mg by mouth at bedtime as needed for anxiety (sleep).   ibuprofen 200 MG tablet Commonly known as: ADVIL Take 200 mg by mouth daily as needed for headache, mild pain or moderate pain.   losartan 50 MG tablet Commonly known as: COZAAR Take 1 tablet (50 mg total) by mouth daily. What changed:   medication strength  how much to take   metoprolol tartrate 25 MG tablet Commonly known as: LOPRESSOR Take 0.5  tablets (12.5 mg total) by mouth 2 (two) times daily. TAKE ONE-HALF TABLET BY MOUTH TWICE A DAY   nitroGLYCERIN 0.4 MG SL tablet Commonly known as: NITROSTAT Place 1 tablet (0.4 mg total) under the tongue as needed for chest pain.   OMEGA 3 PO Take 1 capsule by mouth daily.   POTASSIUM PO Take 1 tablet by mouth 2 (two) times a week.   Vitamin D3 125 MCG (5000 UT) Caps Take 5,000 Units by mouth daily.      Allergies  Allergen Reactions  . Bee Venom Anaphylaxis  . Lanolin Itching and Other (See Comments)    "Wool"  . Sulfa Antibiotics Swelling and Rash  . Ciprofloxacin Nausea And Vomiting  . Depakote [Divalproex Sodium] Other (See Comments)    Reaction not recalled- "it was 20 years ago"  . Pine Itching  . Risperidone Other (See Comments)    Unknown reaction  . Seroquel [Quetiapine Fumarate] Other (See Comments)    VIVID nightmares  .  Statins Other (See Comments)    joint pain, weakness  . Tramadol Other (See Comments)    Serotonin syndrome  . Citalopram Rash and Other (See Comments)    Insomnia and agitation also  . Lamotrigine Itching, Rash and Other (See Comments)    Stevens-Johnson syndrome and Brown urine, also  . Other Other (See Comments) and Cough    Cat dander  . Topiramate Rash and Other (See Comments)    Nightmares, brown urine also  . Zetia [Ezetimibe]     Joint pain, weakness    Follow-up Information    Ronney Asters, NP Follow up on 01/02/2020.   Specialty: Cardiology Why: @9 :15AM Contact information: [address] [place] [address] [phone]        [facility] [place]. Schedule an appointment as soon as possible for a visit in 2 week(s).   Specialty: Family Medicine Contact information: [address] [place] [address] [phone]        Chilton Si, MD .   Specialty: Cardiology Contact information: [address] [place] [address] [place] [address] [phone]            The results of significant diagnostics from this hospitalization (including imaging, microbiology, ancillary and laboratory) are listed below for reference.    Significant Diagnostic Studies: DG Chest 2 View  Result Date: 12/05/2019 CLINICAL DATA:  Status post fall. EXAM: CHEST - 2 VIEW COMPARISON:  March 24, 2015 FINDINGS: Multiple overlying radiopaque cardiac lead wires are seen. There is no evidence of acute infiltrate, pleural effusion or pneumothorax. The heart size and mediastinal contours are within normal limits. The visualized skeletal structures are unremarkable. IMPRESSION: No active cardiopulmonary disease. Electronically Signed   By: Aram Candela M.D.   On: 12/05/2019 16:23   DG Lumbar Spine Complete  Result Date: 12/18/2019 CLINICAL DATA:  Syncope, fall. EXAM: LUMBAR SPINE - COMPLETE 4+ VIEW COMPARISON:  Lumbar radiograph 12/05/2019  FINDINGS: Mild broad-based levo scoliotic curvature which may be positional. Previous anterolisthesis of L4 on L5 is no longer seen. No acute fracture. Vertebral body heights are preserved. Mild multilevel degenerative disc disease most prominent at L2-L3. There is prominent facet hypertrophy at multiple levels. Sacroiliac joints are congruent. Stable coarse calcification in the left pelvis, presumed fibroid. IMPRESSION: 1. No acute fracture of the lumbar spine. 2. Multilevel degenerative change, grossly stable from prior. Electronically Signed   By: Narda Rutherford M.D.   On: 12/18/2019 00:57   DG  Lumbar Spine Complete  Result Date: 12/05/2019 CLINICAL DATA:  Status post fall. EXAM: LUMBAR SPINE - COMPLETE 4+ VIEW COMPARISON:  None. FINDINGS: There is no evidence of lumbar spine fracture. Approximately 2 mm anterolisthesis of the L4 vertebral body is seen on L5. Mild to moderate severity endplate sclerosis is seen at the level of L2-L3. Intervertebral disc spaces are maintained. A 3.7 cm x 2.5 cm coarse calcification is seen within the pelvis on the left. IMPRESSION: 1. No evidence of lumbar spine fracture. 2. Approximately 2 mm anterolisthesis of the L4 vertebral body. 3. Coarse calcification within the pelvis on the left. Electronically Signed   By: Aram Candela M.D.   On: 12/05/2019 16:24   DG Pelvis 1-2 Views  Result Date: 12/18/2019 CLINICAL DATA:  Post fall.  Syncope.  Right hip pain. EXAM: PELVIS - 1-2 VIEW COMPARISON:  Pelvis radiograph 12/05/2019 FINDINGS: The cortical margins of the bony pelvis are intact. No fracture. Pubic symphysis and sacroiliac joints are congruent. Coarse calcification again seen in the left pelvis. Both femoral heads are well-seated in the respective acetabula. IMPRESSION: No pelvic fracture. Electronically Signed   By: Narda Rutherford M.D.   On: 12/18/2019 00:58   DG Pelvis 1-2 Views  Result Date: 12/05/2019 CLINICAL DATA:  Status post fall. EXAM: PELVIS - 1-2  VIEW COMPARISON:  None. FINDINGS: There is no evidence of an acute pelvic fracture or diastasis. An ill-defined chronic appearing deformity is seen along the right inferior pubic ramus. No pelvic bone lesions are seen. A 3.8 cm x 2.4 cm coarse calcification is seen within the pelvis on the left. IMPRESSION: 1. No acute pelvic fracture or diastasis. 2. Coarse calcification within the pelvis on the left which may represent a large calcified uterine fibroid. Electronically Signed   By: Aram Candela M.D.   On: 12/05/2019 16:33   DG Sacrum/Coccyx  Result Date: 12/18/2019 CLINICAL DATA:  Syncope, pelvic pain. EXAM: SACRUM AND COCCYX - 2+ VIEW COMPARISON:  Sacrum radiograph 12/05/2019 FINDINGS: Cortical irregularity involving the sacrococcygeal junction that was not seen on prior exam and suspicious for nondisplaced fracture. The sacral ala are maintained. The sacroiliac joints are congruent. Coarse calcification in the left pelvis again seen. IMPRESSION: Cortical irregularity involving the sacrococcygeal junction, not seen on recent prior exam and suspicious for nondisplaced fracture. Electronically Signed   By: Narda Rutherford M.D.   On: 12/18/2019 00:59   DG Sacrum/Coccyx  Result Date: 12/05/2019 CLINICAL DATA:  Status post fall. EXAM: SACRUM AND COCCYX - 2+ VIEW COMPARISON:  None. FINDINGS: There is no evidence of acute fracture or dislocation. An ill-defined chronic appearing deformity is seen along the right inferior pubic ramus. A 3.6 cm x 2.2 cm coarse calcification is seen within the pelvis on the left. IMPRESSION: 1. No evidence of acute fracture or dislocation. 2. Chronic appearing deformity of the right inferior pubic ramus. 3. Coarse calcification within the pelvis which may represent a large calcified uterine fibroid. Electronically Signed   By: Aram Candela M.D.   On: 12/05/2019 16:27   CT Head Wo Contrast  Result Date: 12/18/2019 CLINICAL DATA:  Syncope, recurrent. Fall at home. Loss  of consciousness. Laceration to back of head. EXAM: CT HEAD WITHOUT CONTRAST TECHNIQUE: Contiguous axial images were obtained from the base of the skull through the vertex without intravenous contrast. COMPARISON:  Head CT 12/05/2019 FINDINGS: Brain: No intracranial hemorrhage, mass effect, or midline shift. Age related atrophy. No hydrocephalus. The basilar cisterns are patent. No evidence of territorial  infarct or acute ischemia. Minor chronic small vessel ischemia. No extra-axial or intracranial fluid collection. Vascular: Atherosclerosis of skullbase vasculature without hyperdense vessel or abnormal calcification. Skull: No fracture or focal lesion. Sinuses/Orbits: Paranasal sinuses and mastoid air cells are clear. The visualized orbits are unremarkable. Other: None. IMPRESSION: 1. No acute intracranial abnormality. No skull fracture. 2. Age related atrophy and minimal chronic small vessel ischemia. Electronically Signed   By: Narda RutherfordMelanie  Sanford M.D.   On: 12/18/2019 01:10   CT Head Wo Contrast  Result Date: 12/05/2019 CLINICAL DATA:  Recent fall with headaches and neck pain, initial encounter EXAM: CT HEAD WITHOUT CONTRAST CT CERVICAL SPINE WITHOUT CONTRAST TECHNIQUE: Multidetector CT imaging of the head and cervical spine was performed following the standard protocol without intravenous contrast. Multiplanar CT image reconstructions of the cervical spine were also generated. COMPARISON:  08/14/2004 FINDINGS: CT HEAD FINDINGS Brain: No evidence of acute infarction, hemorrhage, hydrocephalus, extra-axial collection or mass lesion/mass effect. Vascular: No hyperdense vessel or unexpected calcification. Skull: Normal. Negative for fracture or focal lesion. Sinuses/Orbits: No acute finding. Other: None. CT CERVICAL SPINE FINDINGS Alignment: Within normal limits. Skull base and vertebrae: 7 cervical segments are well visualized. Vertebral body height is well maintained. Multilevel osteophytic changes are noted  from C4-T1. Facet hypertrophic changes are noted bilaterally with mild neural foraminal narrowing. The odontoid is within normal limits. No acute fracture or acute facet abnormality is noted. Soft tissues and spinal canal: Surrounding soft tissue structures show no acute abnormality. Upper chest: Visualized lung apices are unremarkable. Other: None IMPRESSION: CT of the head: No acute intracranial abnormality noted. CT of cervical spine: Multilevel degenerative change without acute abnormality. Electronically Signed   By: Alcide CleverMark  Lukens M.D.   On: 12/05/2019 17:09   CT Angio Chest PE W/Cm &/Or Wo Cm  Result Date: 12/18/2019 CLINICAL DATA:  Recurrent syncope EXAM: CT ANGIOGRAPHY CHEST WITH CONTRAST TECHNIQUE: Multidetector CT imaging of the chest was performed using the standard protocol during bolus administration of intravenous contrast. Multiplanar CT image reconstructions and MIPs were obtained to evaluate the vascular anatomy. CONTRAST:  75mL OMNIPAQUE IOHEXOL 350 MG/ML SOLN COMPARISON:  None. FINDINGS: Cardiovascular: Contrast injection is sufficient to demonstrate satisfactory opacification of the pulmonary arteries to the segmental level. There is no pulmonary embolus or evidence of right heart strain. The size of the main pulmonary artery is normal. Heart size is normal, with no pericardial effusion. The course and caliber of the aorta are normal. There is no atherosclerotic calcification. Opacification decreased due to pulmonary arterial phase contrast bolus timing. Mediastinum/Nodes: No mediastinal, hilar or axillary lymphadenopathy. Normal visualized thyroid. Small hiatal hernia. Lungs/Pleura: Airways are patent. No pleural effusion, lobar consolidation, pneumothorax or pulmonary infarction. Upper Abdomen: Contrast bolus timing is not optimized for evaluation of the abdominal organs. The visualized portions of the organs of the upper abdomen are normal. Musculoskeletal: No chest wall abnormality. No  bony spinal canal stenosis. Review of the MIP images confirms the above findings. IMPRESSION: 1. No pulmonary embolus or other acute thoracic abnormality. 2. Small hiatal hernia. Electronically Signed   By: Deatra RobinsonKevin  Herman M.D.   On: 12/18/2019 02:46   CT Cervical Spine Wo Contrast  Result Date: 12/18/2019 CLINICAL DATA:  Syncope, fall at home. Positive loss of consciousness. Laceration to back of head. EXAM: CT CERVICAL SPINE WITHOUT CONTRAST TECHNIQUE: Multidetector CT imaging of the cervical spine was performed without intravenous contrast. Multiplanar CT image reconstructions were also generated. COMPARISON:  CT 12/05/2019 FINDINGS: Alignment: Straightening of normal lordosis.  No traumatic subluxation. Skull base and vertebrae: No acute fracture. Vertebral body heights are maintained. The dens and skull base are intact. Soft tissues and spinal canal: No prevertebral fluid or swelling. No visible canal hematoma. Disc levels: Unchanged degenerative disc disease and facet hypertrophy. Disc space narrowing and endplate spurring most prominent at C5-C6. Upper chest: No acute findings. Other: Mild carotid calcifications. IMPRESSION: 1. No acute fracture or subluxation of the cervical spine. 2. Unchanged degenerative disc disease and facet hypertrophy. Electronically Signed   By: Narda Rutherford M.D.   On: 12/18/2019 01:14   CT Cervical Spine Wo Contrast  Result Date: 12/05/2019 CLINICAL DATA:  Recent fall with headaches and neck pain, initial encounter EXAM: CT HEAD WITHOUT CONTRAST CT CERVICAL SPINE WITHOUT CONTRAST TECHNIQUE: Multidetector CT imaging of the head and cervical spine was performed following the standard protocol without intravenous contrast. Multiplanar CT image reconstructions of the cervical spine were also generated. COMPARISON:  08/14/2004 FINDINGS: CT HEAD FINDINGS Brain: No evidence of acute infarction, hemorrhage, hydrocephalus, extra-axial collection or mass lesion/mass effect. Vascular:  No hyperdense vessel or unexpected calcification. Skull: Normal. Negative for fracture or focal lesion. Sinuses/Orbits: No acute finding. Other: None. CT CERVICAL SPINE FINDINGS Alignment: Within normal limits. Skull base and vertebrae: 7 cervical segments are well visualized. Vertebral body height is well maintained. Multilevel osteophytic changes are noted from C4-T1. Facet hypertrophic changes are noted bilaterally with mild neural foraminal narrowing. The odontoid is within normal limits. No acute fracture or acute facet abnormality is noted. Soft tissues and spinal canal: Surrounding soft tissue structures show no acute abnormality. Upper chest: Visualized lung apices are unremarkable. Other: None IMPRESSION: CT of the head: No acute intracranial abnormality noted. CT of cervical spine: Multilevel degenerative change without acute abnormality. Electronically Signed   By: Alcide Clever M.D.   On: 12/05/2019 17:09   DG Chest Portable 1 View  Result Date: 12/18/2019 CLINICAL DATA:  Syncope. EXAM: PORTABLE CHEST 1 VIEW COMPARISON:  Radiograph 12/05/2019 FINDINGS: Upper normal heart size with unchanged mediastinal contours. Lower lung volumes from prior exam. No pneumothorax, large pleural effusion, focal airspace disease or pulmonary edema. No acute osseous abnormalities are seen. IMPRESSION: Lower lung volumes from prior exam without acute abnormality. Electronically Signed   By: Narda Rutherford M.D.   On: 12/18/2019 00:55   ECHOCARDIOGRAM COMPLETE  Result Date: 12/18/2019    ECHOCARDIOGRAM REPORT   Patient Name:   JAQLYN GRUENHAGEN  Date of Exam: 12/18/2019 Medical Rec #:  [id number]  Height:       62.0 in Accession #:    [id number] Weight:       194.9 lb Date of Birth:  1955/11/08  BSA:          1.891 m Patient Age:    64 years   BP:           126/79 mmHg Patient Gender: F          HR:           90 bpm. Exam Location:  Inpatient Procedure: 2D Echo, Color Doppler and Cardiac Doppler Indications:    R55 Syncope   History:        Patient has no prior history of Echocardiogram examinations.                 CAD, Arrythmias:Atrial Fibrillation; Risk Factors:Hypertension                 and Dyslipidemia.  Sonographer:    Irving Burton Senior RDCS  Referring Phys: [id number] [place]  1. Left ventricular ejection fraction, by estimation, is 50%. The left ventricle has mildly decreased function. The left ventricle demonstrates regional wall motion abnormalities, septal-lateral dyssynchrony consistent with LBBB. Left ventricular diastolic parameters are consistent with Grade I diastolic dysfunction (impaired relaxation).  2. Right ventricular systolic function is normal. The right ventricular size is normal. Tricuspid regurgitation signal is inadequate for assessing PA pressure.  3. The mitral valve is normal in structure. No evidence of mitral valve regurgitation. No evidence of mitral stenosis.  4. The aortic valve is tricuspid. Aortic valve regurgitation is not visualized. Mild aortic valve sclerosis is present, with no evidence of aortic valve stenosis.  5. The inferior vena cava is normal in size with <50% respiratory variability, suggesting right atrial pressure of 8 mmHg. FINDINGS  Left Ventricle: Left ventricular ejection fraction, by estimation, is 50%. The left ventricle has mildly decreased function. The left ventricle demonstrates regional wall motion abnormalities. The left ventricular internal cavity size was normal in size. There is no left ventricular hypertrophy. Left ventricular diastolic parameters are consistent with Grade I diastolic dysfunction (impaired relaxation). Right Ventricle: The right ventricular size is normal. No increase in right ventricular wall thickness. Right ventricular systolic function is normal. Tricuspid regurgitation signal is inadequate for assessing PA pressure. Left Atrium: Left atrial size was normal in size. Right Atrium: Right atrial size was normal in size. Pericardium:  Trivial pericardial effusion is present. Mitral Valve: The mitral valve is normal in structure. No evidence of mitral valve regurgitation. No evidence of mitral valve stenosis. Tricuspid Valve: The tricuspid valve is normal in structure. Tricuspid valve regurgitation is not demonstrated. Aortic Valve: The aortic valve is tricuspid. Aortic valve regurgitation is not visualized. Mild aortic valve sclerosis is present, with no evidence of aortic valve stenosis. Pulmonic Valve: The pulmonic valve was normal in structure. Pulmonic valve regurgitation is not visualized. Aorta: The aortic root is normal in size and structure. Venous: The inferior vena cava is normal in size with less than 50% respiratory variability, suggesting right atrial pressure of 8 mmHg. IAS/Shunts: No atrial level shunt detected by color flow Doppler.  LEFT VENTRICLE PLAX 2D LVIDd:         4.60 cm  Diastology LVIDs:         3.20 cm  LV e' lateral:   7.72 cm/s LV PW:         1.40 cm  LV E/e' lateral: 11.1 LV IVS:        1.10 cm  LV e' medial:    7.29 cm/s LVOT diam:     1.90 cm  LV E/e' medial:  11.8 LV SV:         75 LV SV Index:   40 LVOT Area:     2.84 cm  RIGHT VENTRICLE RV S prime:     14.50 cm/s TAPSE (M-mode): 2.4 cm LEFT ATRIUM             Index       RIGHT ATRIUM           Index LA diam:        3.70 cm 1.96 cm/m  RA Area:     13.70 cm LA Vol (A2C):   59.3 ml 31.36 ml/m RA Volume:   35.20 ml  18.62 ml/m LA Vol (A4C):   37.2 ml 19.67 ml/m LA Biplane Vol: 50.0 ml 26.44 ml/m  AORTIC VALVE LVOT Vmax:   135.00 cm/s LVOT Vmean:  93.200 cm/s LVOT VTI:    0.264 m  AORTA Ao Root diam: 2.90 cm Ao Asc diam:  3.40 cm MITRAL VALVE MV Area (PHT): 3.16 cm     SHUNTS MV Decel Time: 240 msec     Systemic VTI:  0.26 m MV E velocity: 85.70 cm/s   Systemic Diam: 1.90 cm MV A velocity: 109.00 cm/s MV E/A ratio:  0.79 Marca Ancona MD Electronically signed by Marca Ancona MD Signature Date/Time: 12/18/2019/4:04:56 PM    Final    DG Femur Min 2 Views  Right  Result Date: 12/18/2019 CLINICAL DATA:  Syncope. Right femur pain. EXAM: RIGHT FEMUR 2 VIEWS COMPARISON:  None. FINDINGS: Cortical margins of the femur are intact. There is no evidence of fracture or other focal bone lesions. Soft tissues are unremarkable. Mild degenerative change of the hip and knee. IMPRESSION: No fracture or acute abnormality of the right femur. Electronically Signed   By: Narda Rutherford M.D.   On: 12/18/2019 00:54    Microbiology: Recent Results (from the past 240 hour(s))  Blood culture (routine x 2)     Status: None (Preliminary result)   Collection Time: 12/18/19  2:16 AM   Specimen: BLOOD  Result Value Ref Range Status   Specimen Description BLOOD RIGHT ANTECUBITAL  Final   Special Requests   Final    BOTTLES DRAWN AEROBIC AND ANAEROBIC Blood Culture adequate volume   Culture   Final    NO GROWTH 1 DAY Performed at [facility], [address] [place], [place] [address]    Report Status PENDING  Incomplete  SARS Coronavirus 2 by RT PCR (hospital order, performed in [facility] hospital lab) Nasopharyngeal Nasopharyngeal Swab     Status: None   Collection Time: 12/18/19  3:30 AM   Specimen: Nasopharyngeal Swab  Result Value Ref Range Status   SARS Coronavirus 2 NEGATIVE NEGATIVE Final    Comment: (NOTE) SARS-CoV-2 target nucleic acids are NOT DETECTED. The SARS-CoV-2 RNA is generally detectable in upper and lower respiratory specimens during the acute phase of infection. The lowest concentration of SARS-CoV-2 viral copies this assay can detect is 250 copies / mL. A negative result does not preclude SARS-CoV-2 infection and should not be used as the sole basis for treatment or other patient management decisions.  A negative result may occur with improper specimen collection / handling, submission of specimen other than nasopharyngeal swab, presence of viral mutation(s) within the areas targeted by this assay, and inadequate number of viral  copies (<250 copies / mL). A negative result must be combined with clinical observations, patient history, and epidemiological information. Fact Sheet for Patients:   BoilerBrush.com.cy Fact Sheet for Healthcare Providers: https://pope.com/ This test is not yet approved or cleared  by the [place] FDA and has been authorized for detection and/or diagnosis of SARS-CoV-2 by FDA under an Emergency Use Authorization (EUA).  This EUA will remain in effect (meaning this test can be used) for the duration of the COVID-19 declaration under Section 564(b)(1) of the Act, 21 U.S.C. section 360bbb-3(b)(1), unless the authorization is terminated or revoked sooner. Performed at [facility], [address], [place], [place] [address]   Blood culture (routine x 2)     Status: None (Preliminary result)   Collection Time: 12/18/19  4:20 AM   Specimen: BLOOD  Result Value Ref Range Status   Specimen Description BLOOD RIGHT HAND  Final   Special Requests   Final    BOTTLES  DRAWN AEROBIC AND ANAEROBIC Blood Culture adequate volume   Culture   Final    NO GROWTH 1 DAY Performed at [facility], [address], [place], [place] [address]    Report Status PENDING  Incomplete     Labs: Basic Metabolic Panel: Recent Labs  Lab 12/17/19 2338 12/18/19 0520 12/19/19 0818  NA 143 142 138  K 4.0 3.8 3.9  CL 109 112* 108  CO2 21* 20* 25  GLUCOSE 97 117* 100*  BUN 15 16 17   CREATININE 1.47* 1.22* 0.81  CALCIUM 9.2 8.3* 9.0  MG  --  1.4* 1.9   Liver Function Tests: Recent Labs  Lab 12/17/19 2338 12/18/19 0520  AST 63* 45*  ALT 58* 44  ALKPHOS 80 63  BILITOT 0.6 0.4  PROT 7.1 5.6*  ALBUMIN 4.1 3.3*   No results for input(s): LIPASE, AMYLASE in the last 168 hours. No results for input(s): AMMONIA in the last 168 hours. CBC: Recent Labs  Lab 12/17/19 2338 12/19/19 0508  WBC 7.2 5.0  NEUTROABS 5.3 3.1  HGB 12.9 10.4*  HCT  39.1 31.2*  MCV 98.2 96.3  PLT 303 212   Cardiac Enzymes: No results for input(s): CKTOTAL, CKMB, CKMBINDEX, TROPONINI in the last 168 hours. BNP: BNP (last 3 results) Recent Labs    12/18/19 0216  BNP 35.3    ProBNP (last 3 results) No results for input(s): PROBNP in the last 8760 hours.  CBG: No results for input(s): GLUCAP in the last 168 hours.     Signed:  12/20/19 MD.  [facility] 12/19/2019, 4:39 PM

## 2019-12-23 LAB — CULTURE, BLOOD (ROUTINE X 2)
Culture: NO GROWTH
Special Requests: ADEQUATE

## 2019-12-24 LAB — CULTURE, BLOOD (ROUTINE X 2): Special Requests: ADEQUATE

## 2019-12-31 ENCOUNTER — Other Ambulatory Visit: Payer: Self-pay | Admitting: Cardiovascular Disease

## 2020-01-01 NOTE — Progress Notes (Signed)
Cardiology Clinic Note   Patient Name: Lindsay [name] Date of Encounter: 01/02/2020  Primary Care Provider:  [facility] Cardiologist:  Chilton Si, MD  Patient Profile    Lindsay [name] 64 year old female presents today for follow-up evaluation of her syncope, HTN, and CAD.  Past Medical History    Past Medical History:  Diagnosis Date  . Anginal pain (HCC)   . Atrial fibrillation (HCC)   . Bipolar disorder (HCC)   . Cataract    OU  . Coronary artery disease involving native coronary artery with angina pectoris (HCC) 03/24/2015  . Dysrhythmia   . GERD (gastroesophageal reflux disease)   . History of appendectomy 1963  . Hypertension   . Hypertensive retinopathy    OU  . Myocardial infarction (HCC)    per pt due to Vit D deficiency  . Syncope 11/2019  . Vitamin D deficiency    Past Surgical History:  Procedure Laterality Date  . APPENDECTOMY    . CARDIAC CATHETERIZATION N/A 03/25/2015   Procedure: Left Heart Cath and Coronary Angiography;  Surgeon: Marykay Lex, MD;  Location: [facility] INVASIVE CV LAB;  Service: Cardiovascular;  Laterality: N/A;  . CORONARY STENT INTERVENTION N/A 10/09/2018   Procedure: CORONARY STENT INTERVENTION;  Surgeon: Corky Crafts, MD;  Location: [facility] INVASIVE CV LAB;  Service: Cardiovascular;  Laterality: N/A;  . DILATATION & CURETTAGE/HYSTEROSCOPY WITH MYOSURE N/A 05/19/2016   Procedure: DILATATION & CURETTAGE/HYSTEROSCOPY WITH MYOSURE;  Surgeon: Geryl Rankins, MD;  Location: [facility] ORS;  Service: Gynecology;  Laterality: N/A;  Possible Myosure for polyps.  [name] EYE SURGERY    . LEFT HEART CATH AND CORONARY ANGIOGRAPHY N/A 10/09/2018   Procedure: LEFT HEART CATH AND CORONARY ANGIOGRAPHY;  Surgeon: Corky Crafts, MD;  Location: [facility] INVASIVE CV LAB;  Service: Cardiovascular;  Laterality: N/A;  . MOUTH SURGERY    . URETHRAL DILATION      Allergies  Allergies  Allergen Reactions  . Bee Venom Anaphylaxis  .  Lanolin Itching and Other (See Comments)    "Wool"  . Sulfa Antibiotics Swelling and Rash  . Ciprofloxacin Nausea And Vomiting  . Depakote [Divalproex Sodium] Other (See Comments)    Reaction not recalled- "it was 20 years ago"  . Pine Itching  . Risperidone Other (See Comments)    Unknown reaction  . Seroquel [Quetiapine Fumarate] Other (See Comments)    VIVID nightmares  . Statins Other (See Comments)    joint pain, weakness  . Tramadol Other (See Comments)    Serotonin syndrome  . Citalopram Rash and Other (See Comments)    Insomnia and agitation also  . Lamotrigine Itching, Rash and Other (See Comments)    Stevens-Johnson syndrome and Brown urine, also  . Other Other (See Comments) and Cough    Cat dander  . Topiramate Rash and Other (See Comments)    Nightmares, brown urine also  . Zetia [Ezetimibe]     Joint pain, weakness     History of Present Illness  [name] has a PMH of CAD status post MI, PAF (not on anticoagulation due to GI bleeding) OSA not on CPAP, depression, and prior EtOH abuse who was recently admitted for syncope (12/17/19-5 /26/21). She presented to the emergency department after having several weeks of episodes with lightheadedness.  Her episodes typically came on with moving from a seated position to ambulation.  She had an episode of syncope and collapse on 12/05/2019.  She was brought to [facility]  emergency department for further evaluation/work-up and was discharged home at that time.  She continued to have frequent bouts of severe lightheadedness that worsened with intensity and again would occur when moving from a sitting to standing and ambulation movement.  She denied any seizure type activity.  She also denied weight loss, night sweats, fever, nausea, and diarrhea.  She did note some exertional dyspnea.  An echocardiogram showed an EF of 50%, and G1 DD.  She was also found to have a urinary tract infection she received Augmentin for.  Her blood pressure  medication was initially held and then her losartan was resumed at 50 mg daily.  Her metoprolol and Norvasc were resumed after she received IV hydration.  She presents to the clinic today for follow-up evaluation and states she is in the process of deep cleaning her house and moving furniture.  She states that she has a close friend that is helping her with this large project.  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  He has since passed away and she was not able to work for the past year due to extreme fatigue.  She has not had any further episodes of syncope and is maintaining her p.o. hydration.  I will have her follow-up with lipid clinic for her elevated cholesterol, give her heart healthy diet, have her use thigh-high compression stockings, and follow-up with Dr. Duke Salvia in 3 months.  Today she denies chest pain, shortness of breath,  fatigue, palpitations, melena, hematuria, hemoptysis, diaphoresis, weakness, presyncope, syncope, orthopnea, and PND.   Home Medications    Prior to Admission medications   Medication Sig Start Date End Date Taking? Authorizing Provider  acetaminophen (TYLENOL) 325 MG tablet Take 2 tablets (650 mg total) by mouth every 6 (six) hours as needed for mild pain (or Fever >/= 101). 12/19/19   Rodolph Bong, MD  amLODipine (NORVASC) 2.5 MG tablet Take 1 tablet (2.5 mg total) by mouth daily. Patient taking differently: Take 2.5 mg by mouth at bedtime.  10/11/19   Lennette Bihari, MD  b complex vitamins tablet Take 1 tablet by mouth daily.    [provider]  BIOTIN PO Take 1 tablet by mouth daily.    [provider]  Cholecalciferol (VITAMIN D3) 125 MCG (5000 UT) CAPS Take 5,000 Units by mouth daily.    [provider]  clopidogrel (PLAVIX) 75 MG tablet TAKE ONE TABLET BY MOUTH ONE TIME DAILY Patient taking differently: Take 75 mg by mouth daily.  08/01/19   Bhagat, Sharrell Ku, PA    Coenzyme Q10 (COQ-10 PO) Take 1 capsule by mouth daily.     [provider]  Cyanocobalamin (B-12 PO) Take 1 tablet by mouth 2 (two) times a week.     [provider]  esomeprazole (NEXIUM 24HR CLEAR MINIS) 20 MG capsule Take 1 capsule (20 mg total) by mouth daily before breakfast. 12/19/19   Rodolph Bong, MD  hydrOXYzine (VISTARIL) 50 MG capsule Take 50 mg by mouth at bedtime as needed for anxiety (sleep).  03/03/15   [provider]  ibuprofen (ADVIL,MOTRIN) 200 MG tablet Take 200 mg by mouth daily as needed for headache, mild pain or moderate pain.     [provider]  losartan (COZAAR) 50 MG tablet Take 1 tablet (50 mg total) by mouth daily. 12/19/19   Rodolph Bong, MD  metoprolol tartrate (LOPRESSOR) 25 MG tablet Take 0.5 tablets (12.5  mg total) by mouth 2 (two) times daily. TAKE ONE-HALF TABLET BY MOUTH TWICE A DAY 12/19/19   Eugenie Filler, MD  nitroGLYCERIN (NITROSTAT) 0.4 MG SL tablet Place 1 tablet (0.4 mg total) under the tongue as needed for chest pain. 11/08/17   Almyra Deforest, PA  Omega-3 Fatty Acids (OMEGA 3 PO) Take 1 capsule by mouth daily.    [provider]  POTASSIUM PO Take 1 tablet by mouth 2 (two) times a week.    [provider]    Family History    Family History  Problem Relation Age of Onset  . Vascular Disease Mother   . Dementia Mother   . Heart attack Maternal Grandmother   . Heart attack Maternal Grandfather   . Kidney failure Paternal Grandmother   . Heart attack Paternal Grandfather    She indicated that her mother is deceased. She indicated that her father is alive. She indicated that her brother is deceased. She indicated that her maternal grandmother is deceased. She indicated that her maternal grandfather is deceased. She indicated that her paternal grandmother is deceased. She indicated that her paternal grandfather is deceased.  Social History    Social History   Socioeconomic History  .  Marital status: Single    Spouse name: Not on file  . Number of children: Not on file  . Years of education: Not on file  . Highest education level: Not on file  Occupational History  . Not on file  Tobacco Use  . Smoking status: Never Smoker  . Smokeless tobacco: Never Used  Substance and Sexual Activity  . Alcohol use: Yes    Alcohol/week: 2.0 standard drinks    Types: 2 Standard drinks or equivalent per week    Comment: rarely  . Drug use: No  . Sexual activity: Not on file  Other Topics Concern  . Not on file  Social History Narrative  . Not on file   Social Determinants of Health   Financial Resource Strain:   . Difficulty of Paying Living Expenses:   Food Insecurity:   . Worried About Charity fundraiser in the Last Year:   . Arboriculturist in the Last Year:   Transportation Needs:   . Film/video editor (Medical):   [name] Lack of Transportation (Non-Medical):   Physical Activity:   . Days of Exercise per Week:   . Minutes of Exercise per Session:   Stress:   . Feeling of Stress :   Social Connections:   . Frequency of Communication with Friends and Family:   . Frequency of Social Gatherings with Friends and Family:   . Attends Religious Services:   . Active Member of Clubs or Organizations:   . Attends Archivist Meetings:   [name] Marital Status:   Intimate Partner Violence:   . Fear of Current or Ex-Partner:   . Emotionally Abused:   [name] Physically Abused:   . Sexually Abused:      Review of Systems    General:  No chills, fever, night sweats or weight changes.  Cardiovascular:  No chest pain, dyspnea on exertion, edema, orthopnea, palpitations, paroxysmal nocturnal dyspnea. Dermatological: No rash, lesions/masses Respiratory: No cough, dyspnea Urologic: No hematuria, dysuria Abdominal:   No nausea, vomiting, diarrhea, bright red blood per rectum, melena, or hematemesis Neurologic:  No visual changes, wkns, changes in mental status. All other  systems reviewed and are otherwise negative except as noted above.  Physical Exam  VS:  BP 132/74   Pulse 80   Temp 97.8 F (36.6 C)   Ht 5\' 2"  (1.575 m)   Wt 189 lb 12.8 oz (86.1 kg)   SpO2 99%   BMI 34.71 kg/m  , BMI Body mass index is 34.71 kg/m. GEN: Well nourished, well developed, in no acute distress. HEENT: normal. Neck: Supple, no JVD, carotid bruits, or masses. Cardiac: RRR, no murmurs, rubs, or gallops. No clubbing, cyanosis, edema.  Radials/DP/PT 2+ and equal bilaterally.  Respiratory:  Respirations regular and unlabored, clear to auscultation bilaterally. GI: Soft, nontender, nondistended, BS + x 4. MS: no deformity or atrophy. Skin: warm and dry, no rash. Neuro:  Strength and sensation are intact. Psych: Normal affect.  Accessory Clinical Findings    ECG personally reviewed by me today-normal sinus rhythm ST abnormality consider inferior ischemia, anteriolateral ischemia 80 bpm  Echocardiogram 12/18/19: 1. Left ventricular ejection fraction, by estimation, is 50%. The left  ventricle has mildly decreased function. The left ventricle demonstrates  regional wall motion abnormalities, septal-lateral dyssynchrony consistent  with LBBB. Left ventricular  diastolic parameters are consistent with Grade I diastolic dysfunction  (impaired relaxation).  2. Right ventricular systolic function is normal. The right ventricular  size is normal. Tricuspid regurgitation signal is inadequate for assessing  PA pressure.  3. The mitral valve is normal in structure. No evidence of mitral valve  regurgitation. No evidence of mitral stenosis.  4. The aortic valve is tricuspid. Aortic valve regurgitation is not  visualized. Mild aortic valve sclerosis is present, with no evidence of  aortic valve stenosis.  5. The inferior vena cava is normal in size with <50% respiratory  variability, suggesting right atrial pressure of 8 mmHg.   Assessment & Plan   1.  Syncope-no  further episodes of  syncope.  Has felt fairly well since discharge from the hospital and receiving IV fluids.  She has maintained her p.o. hydration. Continue to monitor Thigh-high support stockings  Coronary artery disease/hyperlipidemia status post PCI-underwent cardiac catheterization with PCI 3/20 due to unstable angina.  She was noted to have episodes of chest pain post cath however, they were felt to be related to GI. Continue clopidogrel, metoprolol Not on aspirin due to GI bleed Not on statin due to intolerance and unable to afford PCSK9 Follow-up with pharmacy/lipid clinic with PCSK9 or bempedoic acid Heart healthy low-sodium diet Increase physical activity as tolerated  Essential hypertension-BP today 132/74.  Losartan decreased to 50 mg during admission due to hypotension. Continue losartan, amlodipine, and metoprolol Heart healthy low-sodium diet Increase physical activity as tolerated  Atrial fibrillation-heart rate today 80 bpm.  Not a candidate for anticoagulation due to GI bleed. Continue clopidogrel, metoprolol Avoid triggers caffeine, chocolate, EtOH etc. Increase physical activity as tolerated  Disposition: Follow-up with Dr. 4/20 in 3 months.  Duke Salvia. Saleem Coccia NP-C    01/02/2020, 10:01 AM [facility] [address] Office [phone] Fax [phone]

## 2020-01-02 ENCOUNTER — Ambulatory Visit (INDEPENDENT_AMBULATORY_CARE_PROVIDER_SITE_OTHER): Payer: Medicare Other | Admitting: General Practice

## 2020-01-02 ENCOUNTER — Encounter: Payer: Self-pay | Admitting: General Practice

## 2020-01-02 ENCOUNTER — Telehealth: Payer: Self-pay | Admitting: General Practice

## 2020-01-02 ENCOUNTER — Other Ambulatory Visit: Payer: Self-pay

## 2020-01-02 VITALS — BP 132/74 | HR 80 | Temp 97.8°F | Ht 62.0 in | Wt 189.8 lb

## 2020-01-02 DIAGNOSIS — I2510 Atherosclerotic heart disease of native coronary artery without angina pectoris: Secondary | ICD-10-CM | POA: Diagnosis not present

## 2020-01-02 DIAGNOSIS — I10 Essential (primary) hypertension: Secondary | ICD-10-CM

## 2020-01-02 DIAGNOSIS — I480 Paroxysmal atrial fibrillation: Secondary | ICD-10-CM

## 2020-01-02 DIAGNOSIS — E785 Hyperlipidemia, unspecified: Secondary | ICD-10-CM

## 2020-01-02 DIAGNOSIS — R55 Syncope and collapse: Secondary | ICD-10-CM

## 2020-01-02 MED ORDER — METOPROLOL TARTRATE 25 MG PO TABS: 12.5000 mg | ORAL_TABLET | Freq: Two times a day (BID) | ORAL | 3 refills | Status: DC

## 2020-01-02 MED ORDER — CLOPIDOGREL BISULFATE 75 MG PO TABS: 75.0000 mg | ORAL_TABLET | Freq: Every day | ORAL | 3 refills | Status: DC

## 2020-01-02 MED ORDER — AMLODIPINE BESYLATE 2.5 MG PO TABS: 2.5000 mg | ORAL_TABLET | Freq: Every day | ORAL | 3 refills | Status: DC

## 2020-01-02 MED ORDER — LOSARTAN POTASSIUM 50 MG PO TABS: 50.0000 mg | ORAL_TABLET | Freq: Every day | ORAL | 3 refills | Status: DC

## 2020-01-02 NOTE — Patient Instructions (Addendum)
Medication Instructions:  The current medical regimen is effective;  continue present plan and medications as directed. Please refer to the Current Medication list given to you today. *If you need a refill on your cardiac medications before your next appointment, please call your pharmacy*  Special Instructions REFER TO DR HILTY  PLEASE PURCHASE AND WEAR THIGH HIGH COMPRESSION STOCKINGS DAILY AND OFF AT BEDTIME. Compression stockings are elastic socks that squeeze the legs. They help to increase blood flow to the legs and to decrease swelling in the legs from fluid retention, and reduce the chance of developing blood clots in the lower legs. Please put on in the AM when dressing and off at night when dressing for bed.   PLEASE MAKE SURE TO ELEVATE YOUR LEGS WHILE SITTING, THIS WILL HELP WITH THE SWELLING ALSO.  INCREASE PHYSICAL ACTIVITY AS TOLERATED  PLEASE MAINTAIN YOUR HYDRATION  PLEASE FOLLOW HEART HEALTHY DIET-ATTACHED  MAY USE NETTI-POT AS NEEDED  Follow-Up: Your next appointment:  3 month(s) In Person with Chilton Si, MD  At [facility], you and your health needs are our priority.  As part of our continuing mission to provide you with exceptional heart care, we have created designated Provider Care Teams.  These Care Teams include your primary Cardiologist (physician) and Advanced Practice Providers (APPs -  Physician Assistants and Nurse Practitioners) who all work together to provide you with the care you need, when you need it.   Fat and Cholesterol Restricted Eating Plan Getting too much fat and cholesterol in your diet may cause health problems. Choosing the right foods helps keep your fat and cholesterol at normal levels.  What are tips for following this plan? Meal planning  At meals, divide your plate into four equal parts: ? Fill one-half of your plate with vegetables and green salads. ? Fill one-fourth of your plate with whole grains. ? Fill one-fourth of  your plate with low-fat (lean) protein foods.  Eat fish that is high in omega-3 fats at least two times a week. This includes mackerel, tuna, sardines, and salmon.  Eat foods that are high in fiber, such as whole grains, beans, apples, broccoli, carrots, peas, and barley. General tips   Work with your doctor to lose weight if you need to.  Avoid: ? Foods with added sugar. ? Fried foods. ? Foods with partially hydrogenated oils.  Limit alcohol intake to no more than 1 drink a day for nonpregnant women and 2 drinks a day for men. One drink equals 12 oz of beer, 5 oz of wine, or 1 oz of hard liquor. Reading food labels  Check food labels for: ? Trans fats. ? Partially hydrogenated oils. ? Saturated fat (g) in each serving. ? Cholesterol (mg) in each serving. ? Fiber (g) in each serving.  Choose foods with healthy fats, such as: ? Monounsaturated fats. ? Polyunsaturated fats. ? Omega-3 fats.  Choose grain products that have whole grains. Look for the word "whole" as the first word in the ingredient list. Cooking  Cook foods using low-fat methods. These include baking, boiling, grilling, and broiling.  Eat more home-cooked foods. Eat at restaurants and buffets less often.  Avoid cooking using saturated fats, such as butter, cream, palm oil, palm kernel oil, and coconut oil. Recommended foods  Fruits  All fresh, canned (in natural juice), or frozen fruits. Vegetables  Fresh or frozen vegetables (raw, steamed, roasted, or grilled). Green salads. Grains  Whole grains, such as whole wheat or whole grain breads, crackers,  cereals, and pasta. Unsweetened oatmeal, bulgur, barley, quinoa, or brown rice. Corn or whole wheat flour tortillas. Meats and other protein foods  Ground beef (85% or leaner), grass-fed beef, or beef trimmed of fat. Skinless chicken or [place]. Ground chicken or [place]. Pork trimmed of fat. All fish and seafood. Egg whites. Dried beans, peas, or lentils.  Unsalted nuts or seeds. Unsalted canned beans. Nut butters without added sugar or oil. Dairy  Low-fat or nonfat dairy products, such as skim or 1% milk, 2% or reduced-fat cheeses, low-fat and fat-free ricotta or cottage cheese, or plain low-fat and nonfat yogurt. Fats and oils  Tub margarine without trans fats. Light or reduced-fat mayonnaise and salad dressings. Avocado. Olive, canola, sesame, or safflower oils. The items listed above may not be a complete list of foods and beverages you can eat. Contact a dietitian for more information. Foods to avoid Fruits  Canned fruit in heavy syrup. Fruit in cream or butter sauce. Fried fruit. Vegetables  Vegetables cooked in cheese, cream, or butter sauce. Fried vegetables. Grains  White bread. White pasta. White rice. Cornbread. Bagels, pastries, and croissants. Crackers and snack foods that contain trans fat and hydrogenated oils. Meats and other protein foods  Fatty cuts of meat. Ribs, chicken wings, bacon, sausage, bologna, salami, chitterlings, fatback, hot dogs, bratwurst, and packaged lunch meats. Liver and organ meats. Whole eggs and egg yolks. Chicken and [place] with skin. Fried meat. Dairy  Whole or 2% milk, cream, half-and-half, and cream cheese. Whole milk cheeses. Whole-fat or sweetened yogurt. Full-fat cheeses. Nondairy creamers and whipped toppings. Processed cheese, cheese spreads, and cheese curds. Beverages  Alcohol. Sugar-sweetened drinks such as sodas, lemonade, and fruit drinks. Fats and oils  Butter, stick margarine, lard, shortening, ghee, or bacon fat. Coconut, palm kernel, and palm oils. Sweets and desserts  Corn syrup, sugars, honey, and molasses. Candy. Jam and jelly. Syrup. Sweetened cereals. Cookies, pies, cakes, donuts, muffins, and ice cream. The items listed above may not be a complete list of foods and beverages you should avoid. Contact a dietitian for more information. Summary  Choosing the right foods  helps keep your fat and cholesterol at normal levels. This can keep you from getting certain diseases.  At meals, fill one-half of your plate with vegetables and green salads.  Eat high-fiber foods, like whole grains, beans, apples, carrots, peas, and barley.  Limit added sugar, saturated fats, alcohol, and fried foods. This information is not intended to replace advice given to you by your health care provider. Make sure you discuss any questions you have with your health care provider. Document Revised: 03/15/2018 Document Reviewed: 03/29/2017 Elsevier Patient Education  [place].

## 2020-01-02 NOTE — Telephone Encounter (Signed)
Discussed with Verdon Cummins NP  Medium strength recommended and should be able to go to [facility] for measuring  Left message to call back

## 2020-01-02 NOTE — Telephone Encounter (Signed)
Patient calling stating she was told at her appointment to get thigh high compression socks, but does not have the information on who she was recommended to go to. She also states she needs information on where she needs to measure herself for them.

## 2020-01-03 NOTE — Telephone Encounter (Signed)
Lm to call back ./cy 

## 2020-01-04 MED ORDER — METOPROLOL TARTRATE 25 MG PO TABS: 12.5000 mg | ORAL_TABLET | Freq: Two times a day (BID) | ORAL | 3 refills | Status: DC

## 2020-01-07 NOTE — Telephone Encounter (Signed)
Left message for patient of jesse's recommendations. She is to call with questions.

## 2020-02-15 ENCOUNTER — Other Ambulatory Visit: Payer: Self-pay | Admitting: Obstetrics and Gynecology

## 2020-02-22 ENCOUNTER — Ambulatory Visit: Payer: Medicare Other

## 2020-02-23 ENCOUNTER — Ambulatory Visit: Payer: Medicare Other

## 2020-03-04 DIAGNOSIS — Z23 Encounter for immunization: Secondary | ICD-10-CM | POA: Diagnosis not present

## 2020-03-06 ENCOUNTER — Other Ambulatory Visit: Payer: Self-pay | Admitting: Obstetrics and Gynecology

## 2020-03-06 DIAGNOSIS — Z1231 Encounter for screening mammogram for malignant neoplasm of breast: Secondary | ICD-10-CM

## 2020-03-19 ENCOUNTER — Ambulatory Visit
Admission: RE | Admit: 2020-03-19 | Discharge: 2020-03-19 | Disposition: A | Discharge status: Home / Self Care | Payer: Medicare Other | Source: Ambulatory Visit

## 2020-03-19 ENCOUNTER — Other Ambulatory Visit: Payer: Self-pay

## 2020-03-19 DIAGNOSIS — Z1231 Encounter for screening mammogram for malignant neoplasm of breast: Secondary | ICD-10-CM | POA: Diagnosis not present

## 2020-03-19 DIAGNOSIS — Z1159 Encounter for screening for other viral diseases: Secondary | ICD-10-CM | POA: Diagnosis not present

## 2020-03-25 DIAGNOSIS — Z23 Encounter for immunization: Secondary | ICD-10-CM | POA: Diagnosis not present

## 2020-03-27 ENCOUNTER — Ambulatory Visit: Payer: Medicare Other | Admitting: Internal Medicine

## 2020-03-27 DIAGNOSIS — M542 Cervicalgia: Secondary | ICD-10-CM | POA: Diagnosis not present

## 2020-03-27 DIAGNOSIS — R1110 Vomiting, unspecified: Secondary | ICD-10-CM | POA: Diagnosis not present

## 2020-04-11 ENCOUNTER — Other Ambulatory Visit: Payer: Self-pay

## 2020-04-11 ENCOUNTER — Ambulatory Visit (INDEPENDENT_AMBULATORY_CARE_PROVIDER_SITE_OTHER): Payer: Medicare Other | Admitting: Cardiovascular Disease

## 2020-04-11 ENCOUNTER — Encounter: Payer: Self-pay | Admitting: Cardiovascular Disease

## 2020-04-11 VITALS — BP 120/74 | HR 67 | Temp 97.7°F | Ht 62.0 in | Wt 190.0 lb

## 2020-04-11 DIAGNOSIS — I482 Chronic atrial fibrillation, unspecified: Secondary | ICD-10-CM | POA: Diagnosis not present

## 2020-04-11 DIAGNOSIS — M545 Low back pain: Secondary | ICD-10-CM | POA: Diagnosis not present

## 2020-04-11 DIAGNOSIS — E785 Hyperlipidemia, unspecified: Secondary | ICD-10-CM | POA: Diagnosis not present

## 2020-04-11 DIAGNOSIS — I214 Non-ST elevation (NSTEMI) myocardial infarction: Secondary | ICD-10-CM | POA: Diagnosis not present

## 2020-04-11 DIAGNOSIS — I10 Essential (primary) hypertension: Secondary | ICD-10-CM

## 2020-04-11 DIAGNOSIS — I2510 Atherosclerotic heart disease of native coronary artery without angina pectoris: Secondary | ICD-10-CM | POA: Diagnosis not present

## 2020-04-11 NOTE — Patient Instructions (Signed)
Medication Instructions:  Your physician recommends that you continue on your current medications as directed. Please refer to the Current Medication list given to you today.  *If you need a refill on your cardiac medications before your next appointment, please call your pharmacy*  Lab Work: NONE   Testing/Procedures: NONE    Follow-Up: At [facility], you and your health needs are our priority.  As part of our continuing mission to provide you with exceptional heart care, we have created designated Provider Care Teams.  These Care Teams include your primary Cardiologist (physician) and Advanced Practice Providers (APPs -  Physician Assistants and Nurse Practitioners) who all work together to provide you with the care you need, when you need it.  We recommend signing up for the patient portal called "MyChart".  Sign up information is provided on this After Visit Summary.  MyChart is used to connect with patients for Virtual Visits (Telemedicine).  Patients are able to view lab/test results, encounter notes, upcoming appointments, etc.  Non-urgent messages can be sent to your provider as well.   To learn more about what you can do with MyChart, go to [email or web address].    Your next appointment:   12 month(s)  You will receive a reminder letter in the mail two months in advance. If you don't receive a letter, please call our office to schedule the follow-up appointment.  The format for your next appointment:   In Person  Provider:   You may see Chilton Si, MD or one of the following Advanced Practice Providers on your designated Care Team:    Corine Shelter, PA-C  [place], [place]  Edd Fabian, [place]  Your physician recommends that you schedule a follow-up appointment in: PHARM D FOR LIPIDS/PCSK9  Other Instructions  CALL YOU GASTROENTEROLOGIST TO SCHEDULE FOLLOW UP   TRY MEDIUM COMPRESSION STOCKINGS

## 2020-04-11 NOTE — Progress Notes (Signed)
Cardiology Office Note   Date:  04/14/2020   ID:  Lindsay [name], Lindsay [name] [birth year], MRN [id number]  PCP:  Jarrett Soho, PA-C  Cardiologist:   Chilton Si, MD   No chief complaint on file.   History of Present Illness: Lindsay [name] is a 64 y.o. female with hypertension, CAD s/p MI in 2010 and PCI 2020, paroxysmal atrial fibrillation, and depression who presents for follow up.  Lindsay [name] was evaluated in clinic on 03/24/15  with recurrent chest pain.  She was started on metoprolol and referred for cardiac catheterization.  She underwent cardiac catheterization on 02/2015 that revealed a 50% RCA lesion but otherwise minimal CAD.  She has been feeing well and denies any recent exertional chest pain.  Occasionally she has "twinges" of chest pain that are not like when she had an MI. She stopped taking Imdur due to headaches.    At her last appointment Lindsay [name] had angina and was referred for cath. She was found to have 75% and 90% D1 lesions.  She had successful PCI.  She saw Lindsay [name] 12/2019 after an episode of dizziness and syncope.  She received IV fluids in the ED and was recommended to wear compression stockings.  She notes occasional skipped beats at times.  She typically notes it when she is laying in bed.  It lasts for a second or two at a time and it isn't bothersome to her.  She has started back walking for exercise.  She had a fall and a broken coccyx but has finally recovered.  She walks for 40 minutes most days as long as it isn't too hot.  She has leg pain but no chest pain or shortness of breath.  She notes that her mood and sleep are also better since she has benn exercising.  She notes that she may be depressed.  She had some hematemesis after eating certain foods or eating too late.  She increased her PPI and it helped but recurred when she went backt o daily.  She reports that her PCP checked her h/h and it was stable.     Past Medical History:  Diagnosis Date  . Anginal pain  (HCC)   . Atrial fibrillation (HCC)   . Bipolar disorder (HCC)   . Cataract    OU  . Coronary artery disease involving native coronary artery with angina pectoris (HCC) 03/24/2015  . Dysrhythmia   . GERD (gastroesophageal reflux disease)   . History of appendectomy 1963  . Hypertension   . Hypertensive retinopathy    OU  . Myocardial infarction (HCC)    per pt due to Vit D deficiency  . Syncope 11/2019  . Vitamin D deficiency     Past Surgical History:  Procedure Laterality Date  . APPENDECTOMY    . CARDIAC CATHETERIZATION N/A 03/25/2015   Procedure: Left Heart Cath and Coronary Angiography;  Surgeon: Marykay Lex, MD;  Location: [facility] INVASIVE CV LAB;  Service: Cardiovascular;  Laterality: N/A;  . CORONARY STENT INTERVENTION N/A 10/09/2018   Procedure: CORONARY STENT INTERVENTION;  Surgeon: Corky Crafts, MD;  Location: [facility] INVASIVE CV LAB;  Service: Cardiovascular;  Laterality: N/A;  . DILATATION & CURETTAGE/HYSTEROSCOPY WITH MYOSURE N/A 05/19/2016   Procedure: DILATATION & CURETTAGE/HYSTEROSCOPY WITH MYOSURE;  Surgeon: Geryl Rankins, MD;  Location: [facility] ORS;  Service: Gynecology;  Laterality: N/A;  Possible Myosure for polyps.  [name] EYE SURGERY    . LEFT HEART CATH AND CORONARY ANGIOGRAPHY N/A 10/09/2018  Procedure: LEFT HEART CATH AND CORONARY ANGIOGRAPHY;  Surgeon: Corky Crafts, MD;  Location: [facility] INVASIVE CV LAB;  Service: Cardiovascular;  Laterality: N/A;  . MOUTH SURGERY    . URETHRAL DILATION       Current Outpatient Medications  Medication Sig Dispense Refill  . acetaminophen (TYLENOL) 325 MG tablet Take 2 tablets (650 mg total) by mouth every 6 (six) hours as needed for mild pain (or Fever >/= 101).    [name] amLODipine (NORVASC) 2.5 MG tablet Take 1 tablet (2.5 mg total) by mouth daily. 30 tablet 3  . b complex vitamins tablet Take 1 tablet by mouth daily.    [name] BIOTIN PO Take 1 tablet by mouth daily.    . Cholecalciferol (VITAMIN D3) 125 MCG (5000 UT) CAPS Take  5,000 Units by mouth daily.    . clopidogrel (PLAVIX) 75 MG tablet Take 1 tablet (75 mg total) by mouth daily. 30 tablet 3  . Coenzyme Q10 (COQ-10 PO) Take 1 capsule by mouth daily.     . Cyanocobalamin (B-12 PO) Take 1 tablet by mouth 2 (two) times a week.     . esomeprazole (NEXIUM 24HR CLEAR MINIS) 20 MG capsule Take 1 capsule (20 mg total) by mouth daily before breakfast. 30 capsule 1  . hydrOXYzine (VISTARIL) 50 MG capsule Take 50 mg by mouth at bedtime as needed for anxiety (sleep).   0  . losartan (COZAAR) 50 MG tablet Take 1 tablet (50 mg total) by mouth daily. 30 tablet 3  . metoprolol tartrate (LOPRESSOR) 25 MG tablet Take 0.5 tablets (12.5 mg total) by mouth 2 (two) times daily. TAKE ONE-HALF TABLET BY MOUTH TWICE A DAY 90 tablet 3  . nitroGLYCERIN (NITROSTAT) 0.4 MG SL tablet Place 1 tablet (0.4 mg total) under the tongue as needed for chest pain. 25 tablet PRN  . Omega-3 Fatty Acids (OMEGA 3 PO) Take 1 capsule by mouth daily.    [name] POTASSIUM PO Take 1 tablet by mouth 2 (two) times a week.     No current facility-administered medications for this visit.    Allergies:   Bee venom, Lanolin, Sulfa antibiotics, Ciprofloxacin, Depakote [divalproex sodium], Pine, Risperidone, Seroquel [quetiapine fumarate], Statins, Tramadol, Citalopram, Lamotrigine, Other, Topiramate, and Zetia [ezetimibe]    Social History:  The patient  reports that she has never smoked. She has never used smokeless tobacco. She reports current alcohol use of about 2.0 standard drinks of alcohol per week. She reports that she does not use drugs.   Family History:  The patient's family history includes Dementia in her mother; Heart attack in her maternal grandfather, maternal grandmother, and paternal grandfather; Kidney failure in her paternal grandmother; Vascular Disease in her mother.    ROS:  Please see the history of present illness.   Otherwise, review of systems are positive for none.   All other systems are  reviewed and negative.    PHYSICAL EXAM: VS:  BP 120/74   Pulse 67   Temp 97.7 F (36.5 C)   Ht 5\' 2"  (1.575 m)   Wt 190 lb (86.2 kg)   SpO2 97%   BMI 34.75 kg/m  , BMI Body mass index is 34.75 kg/m. GENERAL:  Well appearing HEENT: Pupils equal round and reactive, fundi not visualized, oral mucosa unremarkable NECK:  No jugular venous distention, waveform within normal limits, carotid upstroke brisk and symmetric, no bruits LUNGS:  Clear to auscultation bilaterally HEART:  RRR.  PMI not displaced or sustained,S1 and S2 within  normal limits, no S3, no S4, no clicks, no rubs, no murmurs ABD:  Flat, positive bowel sounds normal in frequency in pitch, no bruits, no rebound, no guarding, no midline pulsatile mass, no hepatomegaly, no splenomegaly EXT:  2 plus pulses throughout, no edema, no cyanosis no clubbing SKIN:  No rashes no nodules NEURO:  Cranial nerves II through XII grossly intact, motor grossly intact throughout PSYCH:  Cognitively intact, oriented to person place and time  EKG:  EKG is not ordered today. The ekg ordered 03/24/15 demonstrates sinus rhythm at 87 bpm.  Anterolateral TWI concerning for ischemia.   09/22/18: Sinus rhythm.  Rate 62 bpm.  Diffuse TWI.   Recent Labs: 12/18/2019: ALT 44; B Natriuretic Peptide 35.3; TSH 1.134 12/19/2019: BUN 17; Creatinine, Ser 0.81; Hemoglobin 10.4; Magnesium 1.9; Platelets 212; Potassium 3.9; Sodium 138    Lipid Panel    Component Value Date/Time   CHOL 287 (H) 10/02/2018 1519   TRIG 121 10/02/2018 1519   HDL 59 10/02/2018 1519   CHOLHDL 4.9 (H) 10/02/2018 1519   CHOLHDL 4.3 04/02/2016 1554   VLDL 44 (H) 04/02/2016 1554   LDLCALC 204 (H) 10/02/2018 1519      Wt Readings from Last 3 Encounters:  04/11/20 190 lb (86.2 kg)  01/02/20 189 lb 12.8 oz (86.1 kg)  12/19/19 190 lb (86.2 kg)    TTE 12/16/08: LVEF 55-60%.  Mild LVH.  RV and RA  mildly dilated.  Cardiac catheterization 03/25/15: 1. Prox RCA lesion, 20%  stenosed. 2. Dist RCA lesion, 50% stenosed. 3. Ost 2nd Diag to 2nd Diag lesion, 30% stenosed. 4. The left ventricular systolic function is normal.  LHC 10/09/18:  Ost 2nd Diag to 2nd Diag lesion is 30% stenosed.  Prox RCA lesion is 20% stenosed.  Dist RCA lesion is 50% stenosed.  Ost 1st Diag lesion is 90% stenosed.  Prox Cx lesion is 10% stenosed.  1st Diag lesion is 75% stenosed.  A drug-eluting stent was successfully placed using a STENT SYNERGY DES 2.25X16. A drug-eluting stent was successfully placed using a STENT SYNERGY DES 2.25X8 for distal edge dissection.  Post intervention, there is a 0% residual stenosis.  The left ventricular systolic function is normal.  LV end diastolic pressure is normal.  The left ventricular ejection fraction is 55-65% by visual estimate.  There is no aortic valve stenosis.   DAPT for 6 months along with aggressive secondary prevention.     ASSESSMENT AND PLAN:  # Palpitations:  Sound like PACs/PVCs.  No concerning arrhythmias.   # CAD s/p D1 PCI: # Hyperlipidemia:  [name] had non-obstructive disease on cardiac cath 02/2015. She had D1 PCI.  She is doing fine now.  Lipids are not controlled and she doesn't tolerate statins.  Will refer to PharmD for PCSK9 inhibitor.   # Hypertension: BP well-controlled. Continue amlodipine, losartan and metoprolol.  # Atrial fibrillation: Currently in sinus rhythm.  She is not on anticoagulation 2/2 history of GI bleeding,  Continue metoprolol.  This patients CHA2DS2-VASc Score and unadjusted Ischemic Stroke Rate (% per year) is equal to 0.6 % stroke rate/year from a score of 1  Above score calculated as 1 point each if present [CHF, HTN, DM, Vascular=MI/PAD/Aortic Plaque, Age if 65-74, or Female] Above score calculated as 2 points each if present [Age > 75, or Stroke/TIA/TE]  # Obesity: [name] was congratulated on increasing her exercise.  # Fatigue: # Daytime somnolence: # Apnea: Check  sleep study.  Check TSH, CMP, CBC,  magnesium.   # LE Edema:  Unilateral and worse and the end of the day.  Not present on exam today.  Recommend compression stockings.    Current medicines are reviewed at length with the patient today.  The patient does not have concerns regarding medicines.  The following changes have been made:  None  Labs/ tests ordered today include:  Orders Placed This Encounter  Procedures  . AMB Referral to [facility] Pharm[facility]D     Disposition:   FU with Toi Stelly C. Duke Salvia, MD, [facility] in 1 year   Signed, Chilton Si, MD  04/14/2020 1:05 PM    [facility]

## 2020-04-19 ENCOUNTER — Other Ambulatory Visit: Payer: Self-pay | Admitting: Cardiovascular Disease

## 2020-04-21 ENCOUNTER — Ambulatory Visit: Payer: Medicare Other

## 2020-04-21 NOTE — Progress Notes (Deleted)
Patient ID: Lindsay [name]                 DOB: 1956/06/12                    MRN: [id number]     HPI: Lindsay [name] is a 64 y.o. female patient referred to lipid clinic by Dr. Duke Salvia. PMH is significant for hypertension, CAD s/p stent placement and unstable angina, atrial fibrillation, and depression.   Current Medications:   Intolerances:  Statins?? Ezetimibe - joint pain weakness  LDL goal:  09/2018: CHO 287, TG 121, HDL 59, LDL-c 204  (no therapy)  Diet: Admits her diet goes thru phases of good/bad.  Does eat meats regularly, but also fresh fruits/vegetables.  Eats yogurts with high protein content daily.  Exercise:   Family History: Both parents with multiple strokes, mother lived to age 79, father still living in SNF at age 63.    Social History: EtOh abuse  Labs:  Past Medical History:  Diagnosis Date  . Anginal pain (HCC)   . Atrial fibrillation (HCC)   . Bipolar disorder (HCC)   . Cataract    OU  . Coronary artery disease involving native coronary artery with angina pectoris (HCC) 03/24/2015  . Dysrhythmia   . GERD (gastroesophageal reflux disease)   . History of appendectomy 1963  . Hypertension   . Hypertensive retinopathy    OU  . Myocardial infarction (HCC)    per pt due to Vit D deficiency  . Syncope 11/2019  . Vitamin D deficiency     Current Outpatient Medications on File Prior to Visit  Medication Sig Dispense Refill  . acetaminophen (TYLENOL) 325 MG tablet Take 2 tablets (650 mg total) by mouth every 6 (six) hours as needed for mild pain (or Fever >/= 101).    [name] amLODipine (NORVASC) 2.5 MG tablet Take 1 tablet (2.5 mg total) by mouth daily. 30 tablet 3  . b complex vitamins tablet Take 1 tablet by mouth daily.    [name] BIOTIN PO Take 1 tablet by mouth daily.    . Cholecalciferol (VITAMIN D3) 125 MCG (5000 UT) CAPS Take 5,000 Units by mouth daily.    . clopidogrel (PLAVIX) 75 MG tablet Take 1 tablet (75 mg total) by mouth daily. 30 tablet 3  . Coenzyme Q10  (COQ-10 PO) Take 1 capsule by mouth daily.     . Cyanocobalamin (B-12 PO) Take 1 tablet by mouth 2 (two) times a week.     . esomeprazole (NEXIUM 24HR CLEAR MINIS) 20 MG capsule Take 1 capsule (20 mg total) by mouth daily before breakfast. 30 capsule 1  . hydrOXYzine (VISTARIL) 50 MG capsule Take 50 mg by mouth at bedtime as needed for anxiety (sleep).   0  . losartan (COZAAR) 50 MG tablet Take 1 tablet (50 mg total) by mouth daily. 30 tablet 3  . metoprolol tartrate (LOPRESSOR) 25 MG tablet Take 0.5 tablets (12.5 mg total) by mouth 2 (two) times daily. TAKE ONE-HALF TABLET BY MOUTH TWICE A DAY 90 tablet 3  . nitroGLYCERIN (NITROSTAT) 0.4 MG SL tablet Place 1 tablet (0.4 mg total) under the tongue as needed for chest pain. 25 tablet PRN  . Omega-3 Fatty Acids (OMEGA 3 PO) Take 1 capsule by mouth daily.    [name] POTASSIUM PO Take 1 tablet by mouth 2 (two) times a week.     No current facility-administered medications on file prior to visit.    Allergies  Allergen Reactions  . Bee Venom Anaphylaxis  . Lanolin Itching and Other (See Comments)    "Wool"  . Sulfa Antibiotics Swelling and Rash  . Ciprofloxacin Nausea And Vomiting  . Depakote [Divalproex Sodium] Other (See Comments)    Reaction not recalled- "it was 20 years ago"  . Pine Itching  . Risperidone Other (See Comments)    Unknown reaction  . Seroquel [Quetiapine Fumarate] Other (See Comments)    VIVID nightmares  . Statins Other (See Comments)    joint pain, weakness  . Tramadol Other (See Comments)    Serotonin syndrome  . Citalopram Rash and Other (See Comments)    Insomnia and agitation also  . Lamotrigine Itching, Rash and Other (See Comments)    Stevens-Johnson syndrome and Brown urine, also  . Other Other (See Comments) and Cough    Cat dander  . Topiramate Rash and Other (See Comments)    Nightmares, brown urine also  . Zetia [Ezetimibe]     Joint pain, weakness     No problem-specific Assessment & Plan notes found  for this encounter.    Maresa Morash Rodriguez-Guzman PharmD, BCPS, CPP [facility] [address] [place] [id number] 04/21/2020 8:15 AM

## 2020-04-25 ENCOUNTER — Other Ambulatory Visit: Payer: Self-pay

## 2020-04-25 ENCOUNTER — Ambulatory Visit (INDEPENDENT_AMBULATORY_CARE_PROVIDER_SITE_OTHER): Payer: Medicare Other | Admitting: Internal Medicine

## 2020-04-25 ENCOUNTER — Encounter: Payer: Self-pay | Admitting: Internal Medicine

## 2020-04-25 VITALS — BP 130/80 | HR 78 | Ht 62.0 in | Wt 193.0 lb

## 2020-04-25 DIAGNOSIS — M791 Myalgia, unspecified site: Secondary | ICD-10-CM | POA: Diagnosis not present

## 2020-04-25 DIAGNOSIS — I2510 Atherosclerotic heart disease of native coronary artery without angina pectoris: Secondary | ICD-10-CM | POA: Diagnosis not present

## 2020-04-25 DIAGNOSIS — T466X5A Adverse effect of antihyperlipidemic and antiarteriosclerotic drugs, initial encounter: Secondary | ICD-10-CM | POA: Diagnosis not present

## 2020-04-25 DIAGNOSIS — E7849 Other hyperlipidemia: Secondary | ICD-10-CM | POA: Diagnosis not present

## 2020-04-25 DIAGNOSIS — Z95820 Peripheral vascular angioplasty status with implants and grafts: Secondary | ICD-10-CM

## 2020-04-25 DIAGNOSIS — E785 Hyperlipidemia, unspecified: Secondary | ICD-10-CM | POA: Diagnosis not present

## 2020-04-25 LAB — LIPID PANEL
Chol/HDL Ratio: 5.3 ratio — ABNORMAL HIGH (ref 0.0–4.4)
Cholesterol, Total: 308 mg/dL — ABNORMAL HIGH (ref 100–199)
HDL: 58 mg/dL (ref >39)
LDL Chol Calc (NIH): 220 mg/dL — ABNORMAL HIGH (ref 0–99)
Triglycerides: 159 mg/dL — ABNORMAL HIGH (ref 0–149)
VLDL Cholesterol Cal: 30 mg/dL (ref 5–40)

## 2020-04-25 NOTE — Progress Notes (Signed)
LIPID CLINIC CONSULT NOTE  Chief Complaint:  Manage dyslipidemia  Primary Care Physician: Jarrett Soho, PA-C  Primary Cardiologist:  Chilton Si, MD  HPI:  Lindsay [name] is a 64 y.o. female who is being seen today for the evaluation of dyslipidemia at the request of Ronney Asters, NP.  This is a pleasant 64 year old female who presents for evaluation and management of dyslipidemia.  Past medical history significant for atrial fibrillation, coronary artery disease with prior intervention, myocardial infarction, hypertension and bipolar disorder.  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  She is followed closely by Dr. Duke Salvia and noted to have a dyslipidemia above target LDL less than 70 but has been intolerant to statins and ezetimibe causing significant myalgias.  Her last lipid profile is actually quite old from March 2020 showing total cholesterol 287, HDL 59, LDL of 204 and triglycerides 121.  She reports a varied diet not necessarily low in saturated fats.  There is a strong family history of heart disease and heart attacks mostly on the mother side of the family including several aunts and uncles which is highly suggestive of familial hyperlipidemia.  PMHx:  Past Medical History:  Diagnosis Date  . Anginal pain (HCC)   . Atrial fibrillation (HCC)   . Bipolar disorder (HCC)   . Cataract    OU  . Coronary artery disease involving native coronary artery with angina pectoris (HCC) 03/24/2015  . Dysrhythmia   . GERD (gastroesophageal reflux disease)   . History of appendectomy 1963  . Hypertension   . Hypertensive retinopathy    OU  . Myocardial infarction (HCC)    per pt due to Vit D deficiency  . Syncope 11/2019  . Vitamin D deficiency     Past Surgical History:  Procedure Laterality Date  . APPENDECTOMY    . CARDIAC CATHETERIZATION N/A 03/25/2015   Procedure: Left Heart Cath and Coronary Angiography;   Surgeon: Marykay Lex, MD;  Location[facility] INVAS[facility] CV LAB;  Service: Cardiovascular;  Laterality: N/A;  . CORONARY STENT INTERVENTION N/A 10/09/2018   Procedure: CORONARY STENT INTERVENTION;  Surgeon: Corky Crafts, MD;  Location: [facility] INVASIVE CV LAB;  Service: Cardiovascular;  Laterality: N/A;  . DILATATION & CURETTAGE/HYSTEROSCOPY WITH MYOSURE N/A 05/19/2016   Procedure: DILATATION & CURETTAGE/HYSTEROSCOPY WITH MYOSURE;  Surgeon: Geryl Rankins, MD;  Location: [facility] ORS;  Service: Gynecology;  Laterality: N/A;  Possible Myosure for polyps.  [name] EYE SURGERY    . LEFT HEART CATH AND CORONARY ANGIOGRAPHY N/A 10/09/2018   Procedure: LEFT HEART CATH AND CORONARY ANGIOGRAPHY;  Surgeon: Corky Crafts, MD;  Location: [facility] INVASIVE CV LAB;  Service: Cardiovascular;  Laterality: N/A;  . MOUTH SURGERY    . URETHRAL DILATION      FAMHx:  Family History  Problem Relation Age of Onset  . Vascular Disease Mother   . Dementia Mother   . Heart attack Maternal Grandmother   . Heart attack Maternal Grandfather   . Kidney failure Paternal Grandmother   . Heart attack Paternal Grandfather     SOCHx:   reports that she has never smoked. She has never used smokeless tobacco. She reports current alcohol use of about 2.0 standard drinks of alcohol per week. She reports that she does not use drugs.  ALLERGIES:  Allergies  Allergen Reactions  . Sulfa Antibiotics Swelling and Rash  . Ciprofloxacin Nausea And Vomiting  . Depakote [Divalproex Sodium] Other (See Comments)  Reaction not recalled- "it was 20 years ago"  . Pine Itching  . Risperidone Other (See Comments)    Unknown reaction  . Seroquel [Quetiapine Fumarate] Other (See Comments)    VIVID nightmares  . Statins Other (See Comments)    joint pain, weakness  . Tramadol Other (See Comments)    Serotonin syndrome  . Citalopram Rash and Other (See Comments)    Insomnia and agitation also  . Lamotrigine Itching, Rash and Other (See  Comments)    Stevens-Johnson syndrome and Brown urine, also  . Other Other (See Comments) and Cough    Cat dander  . Topiramate Rash and Other (See Comments)    Nightmares, brown urine also  . Zetia [Ezetimibe]     Joint pain, weakness     ROS: Pertinent items noted in HPI and remainder of comprehensive ROS otherwise negative.  HOME MEDS: Current Outpatient Medications on File Prior to Visit  Medication Sig Dispense Refill  . acetaminophen (TYLENOL) 325 MG tablet Take 2 tablets (650 mg total) by mouth every 6 (six) hours as needed for mild pain (or Fever >/= 101).    [name] amLODipine (NORVASC) 2.5 MG tablet TAKE ONE TABLET BY MOUTH ONE TIME DAILY 30 tablet 3  . b complex vitamins tablet Take 1 tablet by mouth daily.    [name] BIOTIN PO Take 1 tablet by mouth daily.    . Cholecalciferol (VITAMIN D3) 125 MCG (5000 UT) CAPS Take 5,000 Units by mouth daily.    . clopidogrel (PLAVIX) 75 MG tablet Take 1 tablet (75 mg total) by mouth daily. 30 tablet 3  . Coenzyme Q10 (COQ-10 PO) Take 1 capsule by mouth daily.     . Cyanocobalamin (B-12 PO) Take 1 tablet by mouth 2 (two) times a week.     . esomeprazole (NEXIUM 24HR CLEAR MINIS) 20 MG capsule Take 1 capsule (20 mg total) by mouth daily before breakfast. 30 capsule 1  . hydrOXYzine (VISTARIL) 50 MG capsule Take 50 mg by mouth at bedtime as needed for anxiety (sleep).   0  . losartan (COZAAR) 50 MG tablet Take 1 tablet (50 mg total) by mouth daily. 30 tablet 3  . metoprolol tartrate (LOPRESSOR) 25 MG tablet Take 0.5 tablets (12.5 mg total) by mouth 2 (two) times daily. TAKE ONE-HALF TABLET BY MOUTH TWICE A DAY 90 tablet 3  . nitroGLYCERIN (NITROSTAT) 0.4 MG SL tablet Place 1 tablet (0.4 mg total) under the tongue as needed for chest pain. 25 tablet PRN  . Omega-3 Fatty Acids (OMEGA 3 PO) Take 1 capsule by mouth daily.    [name] POTASSIUM PO Take 1 tablet by mouth 2 (two) times a week.     No current facility-administered medications on file prior to visit.     LABS/IMAGING: No results found for this or any previous visit (from the past 48 hour(s)). No results found.  LIPID PANEL:    Component Value Date/Time   CHOL 287 (H) 10/02/2018 1519   TRIG 121 10/02/2018 1519   HDL 59 10/02/2018 1519   CHOLHDL 4.9 (H) 10/02/2018 1519   CHOLHDL 4.3 04/02/2016 1554   VLDL 44 (H) 04/02/2016 1554   LDLCALC 204 (H) 10/02/2018 1519    WEIGHTS: Wt Readings from Last 3 Encounters:  04/25/20 193 lb (87.5 kg)  04/11/20 190 lb (86.2 kg)  01/02/20 189 lb 12.8 oz (86.1 kg)    VITALS: BP 130/80   Pulse 78   Ht 5\' 2"  (1.575 m)   Wt  193 lb (87.5 kg)   SpO2 98%   BMI 35.30 kg/m   EXAM: Deferred  EKG: Deferred  ASSESSMENT: 1. Probable familial hyperlipidemia 2. Coronary artery disease with prior PCI 3. History of myocardial infarction 4. Statin and ezetimibe intolerance-myalgias  PLAN: 1.   This is a pleasant 64 year old female with probable familial hyperlipidemia and known coronary disease with prior MI in 2010 and recent PCI with multivessel coronary disease by cath in March 2020.  Unfortunate she has been intolerant to statin and ezetimibe.  She has a strong family history of heart disease including numerous relatives on her mother side of the family and her mother highly suggestive of a familial hyperlipidemia.  She has no children.  She is a good candidate for PCSK9 inhibitor and needs greater than 50% reduction in LDL cholesterol per consensus guidelines.  We will pursue prior authorization for this.  Cost may be a challenge and have encouraged her to look into company support for the medication.  Unfortunately some of the other grant programs that were available are no longer options.  Follow-up with me and repeat lipids in 3 to 4 months.  We will check labs today as her most recent labs are more than a year old, however I suspect her LDL is still elevated.  Thanks for the kind referral.  Chrystie Nose, MD, [facility]  [place]   [facility]  Medical Director of the Advanced Lipid Disorders &  Cardiovascular Risk Reduction Clinic Diplomate of the American Board of Clinical Lipidology Attending Cardiologist  Direct Dial: [phone]  Fax: [phone]  Website:  [email or web address] Nicely Lindsay [name] 04/25/2020, 10:39 AM

## 2020-04-25 NOTE — Patient Instructions (Addendum)
Medication Instructions:  Dr. Rennis Golden recommends Repatha or Praluent (PCSK9). This is an injectable cholesterol medication self-administered once every 14 days. This medication will likely need prior approval with your insurance company, which we will work on. If the medication is not approved initially, we may need to do an appeal with your insurance. We will keep you updated on this process.   Administer medication in area of fatty tissue such as abdomen, outer thigh, back up of arm - and rotate site with each injection Store medication in refrigerator until ready to administer - allow to sit at room temp for 30 mins - 1 hour prior to injection Dispose of medication in a SHARPS container - your pharmacy should be able to direct you on this and proper disposal   If you need co-pay assistance grant, please look into the program at healthwellfoundation.[email or web address] >> disease funds >> hypercholesterolemia. This is an online application or you can call to complete. Once approved, you will provide the "pharmacy card" information to your pharmacy and they will deduct the co-pays from this grant.  If you need a co-pay card for Repatha: [email or web address].com.[email or web address] >> paying for Repatha or red box that says "Repatha Copay Card" in top right If you need a co-pay card for Praluent: [email or web address].[email or web address] >> starting & paying for Praluent  *If you need a refill on your cardiac medications before your next appointment, please call your pharmacy*   Lab Work: LIPID PANEL today  FASTING lab work in 3-4 months to check cholesterol   If you have labs (blood work) drawn today and your tests are completely normal, you will receive your results only by: [name] MyChart Message (if you have MyChart) OR . A paper copy in the mail If you have any lab test that is abnormal or we need to change your treatment, we will call you to review the results.   Testing/Procedures: NONE   Follow-Up: At [facility], you and your health needs are our priority.   As part of our continuing mission to provide you with exceptional heart care, we have created designated Provider Care Teams.  These Care Teams include your primary Cardiologist (physician) and Advanced Practice Providers (APPs -  Physician Assistants and Nurse Practitioners) who all work together to provide you with the care you need, when you need it.  We recommend signing up for the patient portal called "MyChart".  Sign up information is provided on this After Visit Summary.  MyChart is used to connect with patients for Virtual Visits (Telemedicine).  Patients are able to view lab/test results, encounter notes, upcoming appointments, etc.  Non-urgent messages can be sent to your provider as well.   To learn more about what you can do with MyChart, go to [email or web address].    Your next appointment:   3-4 month(s) - lipid clinic  The format for your next appointment:   In Person or Virtual  Provider:   K. [place] Hilty, MD   Other Instructions

## 2020-04-28 DIAGNOSIS — M5416 Radiculopathy, lumbar region: Secondary | ICD-10-CM | POA: Diagnosis not present

## 2020-04-28 DIAGNOSIS — M5459 Other low back pain: Secondary | ICD-10-CM | POA: Diagnosis not present

## 2020-04-29 ENCOUNTER — Other Ambulatory Visit: Payer: Self-pay | Admitting: *Deleted

## 2020-04-29 ENCOUNTER — Telehealth: Payer: Self-pay | Admitting: Internal Medicine

## 2020-04-29 MED ORDER — REPATHA SURECLICK 140 MG/ML ~~LOC~~ SOAJ: 1.0000 | SUBCUTANEOUS | 11 refills | Status: DC

## 2020-04-29 NOTE — Telephone Encounter (Signed)
PA for Repatha submitted via CMM PA Case: [id number], Status: Approved, Coverage Starts on: 04/29/2020 12:00:00 AM, Coverage Ends on: 10/26/2020 12:00:00 AM

## 2020-05-17 DIAGNOSIS — K047 Periapical abscess without sinus: Secondary | ICD-10-CM | POA: Diagnosis not present

## 2020-05-23 DIAGNOSIS — M545 Low back pain, unspecified: Secondary | ICD-10-CM | POA: Diagnosis not present

## 2020-05-26 DIAGNOSIS — Z7901 Long term (current) use of anticoagulants: Secondary | ICD-10-CM | POA: Diagnosis not present

## 2020-05-26 DIAGNOSIS — Z1211 Encounter for screening for malignant neoplasm of colon: Secondary | ICD-10-CM | POA: Diagnosis not present

## 2020-05-26 DIAGNOSIS — K219 Gastro-esophageal reflux disease without esophagitis: Secondary | ICD-10-CM | POA: Diagnosis not present

## 2020-05-26 DIAGNOSIS — K449 Diaphragmatic hernia without obstruction or gangrene: Secondary | ICD-10-CM | POA: Diagnosis not present

## 2020-05-26 DIAGNOSIS — K920 Hematemesis: Secondary | ICD-10-CM | POA: Diagnosis not present

## 2020-05-27 DIAGNOSIS — M170 Bilateral primary osteoarthritis of knee: Secondary | ICD-10-CM | POA: Diagnosis not present

## 2020-05-27 DIAGNOSIS — M25562 Pain in left knee: Secondary | ICD-10-CM | POA: Diagnosis not present

## 2020-05-28 DIAGNOSIS — K047 Periapical abscess without sinus: Secondary | ICD-10-CM | POA: Diagnosis not present

## 2020-07-03 DIAGNOSIS — R0781 Pleurodynia: Secondary | ICD-10-CM | POA: Diagnosis not present

## 2020-07-03 DIAGNOSIS — M545 Low back pain, unspecified: Secondary | ICD-10-CM | POA: Diagnosis not present

## 2020-07-03 DIAGNOSIS — M25551 Pain in right hip: Secondary | ICD-10-CM | POA: Diagnosis not present

## 2020-07-03 HISTORY — DX: Pleurodynia: R07.81

## 2020-07-03 HISTORY — DX: Pain in right hip: M25.551

## 2020-07-21 ENCOUNTER — Telehealth: Payer: Self-pay | Admitting: Internal Medicine

## 2020-07-21 DIAGNOSIS — Z1159 Encounter for screening for other viral diseases: Secondary | ICD-10-CM | POA: Diagnosis not present

## 2020-07-21 NOTE — Telephone Encounter (Signed)
Left VM requesting call back.   ---  Last seen by Dr. Rennis Golden 04/25/20 for lipid management. Last seen 04/11/20 by Dr. Duke Salvia doing well from cardiac perspective and recommended for 1 year follow up. She is on DAPT after DES to D1 in 09/2018. She has been holding Plavix since 07/17/20 per phone call received.   Alver Sorrow, NP

## 2020-07-21 NOTE — Telephone Encounter (Signed)
° °  [facility] Pre-operative Risk Assessment    Request for surgical clearance:  1. What type of surgery is being performed? Colonoscopy and Endoscopy   2. When is this surgery scheduled? 07/23/20 at 9:30am  3. What type of clearance is required (medical clearance vs. Pharmacy clearance to hold med vs. Both)? Both  4. Are there any medications that need to be held prior to surgery and how long? clopidogrel (PLAVIX) 75 MG tablet needs to be held for 5 days, patient stopped on 07/17/20.  5. Practice name and name of physician performing surgery?  [facility], Dr. Therisa Doyne  6. What is your office phone number [phone]   7.   What is your office fax number [phone]  8.   Anesthesia type (None, local, MAC, general) ? Propofol   Lindsay [name] 07/21/2020, 11:09 AM  _________________________________________________________________   (provider comments below)

## 2020-07-21 NOTE — Telephone Encounter (Signed)
   Primary Cardiologist: Chilton Si, MD  Chart reviewed as part of pre-operative protocol coverage. Patient was contacted 07/21/2020 in reference to pre-operative risk assessment for pending surgery as outlined below.  Lindsay [name] was last seen on 04/25/20 by Dr. Rennis Golden.  Since that day, Lindsay [name] has done well.  Therefore, based on ACC/AHA guidelines, the patient would be at acceptable risk for the planned procedure without further cardiovascular testing.   As she as already been holding her Plavix for 4 days, will proceed with holding Plavix prior to colonoscopy. She verbalized understanding to ask the gastroenterologist when to resume Plavix.   The patient was advised that if she develops new symptoms prior to surgery to contact our office to arrange for a follow-up visit, and she verbalized understanding.  I will route this recommendation to the requesting party via Epic fax function and remove from pre-op pool. Please call with questions.  Alver Sorrow, NP 07/21/2020, 1:23 PM

## 2020-07-23 DIAGNOSIS — K2930 Chronic superficial gastritis without bleeding: Secondary | ICD-10-CM | POA: Diagnosis not present

## 2020-07-23 DIAGNOSIS — Z1211 Encounter for screening for malignant neoplasm of colon: Secondary | ICD-10-CM | POA: Diagnosis not present

## 2020-07-23 DIAGNOSIS — K5730 Diverticulosis of large intestine without perforation or abscess without bleeding: Secondary | ICD-10-CM | POA: Diagnosis not present

## 2020-07-23 DIAGNOSIS — K920 Hematemesis: Secondary | ICD-10-CM | POA: Diagnosis not present

## 2020-07-23 DIAGNOSIS — K3189 Other diseases of stomach and duodenum: Secondary | ICD-10-CM | POA: Diagnosis not present

## 2020-07-23 DIAGNOSIS — K317 Polyp of stomach and duodenum: Secondary | ICD-10-CM | POA: Diagnosis not present

## 2020-07-23 DIAGNOSIS — K449 Diaphragmatic hernia without obstruction or gangrene: Secondary | ICD-10-CM | POA: Diagnosis not present

## 2020-07-30 DIAGNOSIS — K317 Polyp of stomach and duodenum: Secondary | ICD-10-CM | POA: Diagnosis not present

## 2020-07-30 DIAGNOSIS — K2930 Chronic superficial gastritis without bleeding: Secondary | ICD-10-CM | POA: Diagnosis not present

## 2020-08-20 ENCOUNTER — Other Ambulatory Visit: Payer: Self-pay | Admitting: Cardiovascular Disease

## 2020-08-21 ENCOUNTER — Ambulatory Visit: Payer: Medicare Other | Admitting: Internal Medicine

## 2020-09-05 DIAGNOSIS — J329 Chronic sinusitis, unspecified: Secondary | ICD-10-CM | POA: Diagnosis not present

## 2020-09-26 ENCOUNTER — Telehealth: Payer: Medicare Other | Admitting: Internal Medicine

## 2020-10-01 DIAGNOSIS — F3130 Bipolar disorder, current episode depressed, mild or moderate severity, unspecified: Secondary | ICD-10-CM | POA: Diagnosis not present

## 2020-10-01 DIAGNOSIS — Z043 Encounter for examination and observation following other accident: Secondary | ICD-10-CM | POA: Diagnosis not present

## 2020-10-01 DIAGNOSIS — E161 Other hypoglycemia: Secondary | ICD-10-CM | POA: Diagnosis not present

## 2020-10-01 DIAGNOSIS — Z883 Allergy status to other anti-infective agents status: Secondary | ICD-10-CM | POA: Diagnosis not present

## 2020-10-01 DIAGNOSIS — F319 Bipolar disorder, unspecified: Secondary | ICD-10-CM | POA: Diagnosis not present

## 2020-10-01 DIAGNOSIS — Z7982 Long term (current) use of aspirin: Secondary | ICD-10-CM | POA: Diagnosis not present

## 2020-10-01 DIAGNOSIS — M47816 Spondylosis without myelopathy or radiculopathy, lumbar region: Secondary | ICD-10-CM | POA: Diagnosis not present

## 2020-10-01 DIAGNOSIS — E162 Hypoglycemia, unspecified: Secondary | ICD-10-CM | POA: Diagnosis not present

## 2020-10-01 DIAGNOSIS — R52 Pain, unspecified: Secondary | ICD-10-CM | POA: Diagnosis not present

## 2020-10-01 DIAGNOSIS — M4316 Spondylolisthesis, lumbar region: Secondary | ICD-10-CM | POA: Diagnosis not present

## 2020-10-01 DIAGNOSIS — M5134 Other intervertebral disc degeneration, thoracic region: Secondary | ICD-10-CM | POA: Diagnosis not present

## 2020-10-01 DIAGNOSIS — S299XXA Unspecified injury of thorax, initial encounter: Secondary | ICD-10-CM | POA: Diagnosis not present

## 2020-10-01 DIAGNOSIS — Z79899 Other long term (current) drug therapy: Secondary | ICD-10-CM | POA: Diagnosis not present

## 2020-10-01 DIAGNOSIS — M25551 Pain in right hip: Secondary | ICD-10-CM | POA: Diagnosis not present

## 2020-10-01 DIAGNOSIS — S39012A Strain of muscle, fascia and tendon of lower back, initial encounter: Secondary | ICD-10-CM | POA: Diagnosis not present

## 2020-10-01 DIAGNOSIS — G8911 Acute pain due to trauma: Secondary | ICD-10-CM | POA: Diagnosis not present

## 2020-10-01 DIAGNOSIS — Z888 Allergy status to other drugs, medicaments and biological substances status: Secondary | ICD-10-CM | POA: Diagnosis not present

## 2020-10-01 DIAGNOSIS — M5136 Other intervertebral disc degeneration, lumbar region: Secondary | ICD-10-CM | POA: Diagnosis not present

## 2020-10-01 DIAGNOSIS — I4891 Unspecified atrial fibrillation: Secondary | ICD-10-CM | POA: Diagnosis not present

## 2020-10-01 DIAGNOSIS — Z882 Allergy status to sulfonamides status: Secondary | ICD-10-CM | POA: Diagnosis not present

## 2020-10-01 DIAGNOSIS — Z91030 Bee allergy status: Secondary | ICD-10-CM | POA: Diagnosis not present

## 2020-10-01 DIAGNOSIS — I252 Old myocardial infarction: Secondary | ICD-10-CM | POA: Diagnosis not present

## 2020-10-01 DIAGNOSIS — I10 Essential (primary) hypertension: Secondary | ICD-10-CM | POA: Diagnosis not present

## 2020-10-01 DIAGNOSIS — S20212A Contusion of left front wall of thorax, initial encounter: Secondary | ICD-10-CM | POA: Diagnosis not present

## 2020-10-01 DIAGNOSIS — M47814 Spondylosis without myelopathy or radiculopathy, thoracic region: Secondary | ICD-10-CM | POA: Diagnosis not present

## 2020-10-01 DIAGNOSIS — S20222A Contusion of left back wall of thorax, initial encounter: Secondary | ICD-10-CM | POA: Diagnosis not present

## 2020-10-01 DIAGNOSIS — W19XXXA Unspecified fall, initial encounter: Secondary | ICD-10-CM | POA: Diagnosis not present

## 2020-10-06 ENCOUNTER — Other Ambulatory Visit: Payer: Self-pay | Admitting: *Deleted

## 2020-10-06 ENCOUNTER — Telehealth: Payer: Self-pay | Admitting: Cardiovascular Disease

## 2020-10-06 MED ORDER — METOPROLOL TARTRATE 25 MG PO TABS: ORAL_TABLET | ORAL | 1 refills | Status: DC

## 2020-10-06 MED ORDER — CLOPIDOGREL BISULFATE 75 MG PO TABS: 75.0000 mg | ORAL_TABLET | Freq: Every day | ORAL | 1 refills | Status: DC

## 2020-10-06 MED ORDER — LOSARTAN POTASSIUM 50 MG PO TABS: 50.0000 mg | ORAL_TABLET | Freq: Every day | ORAL | 1 refills | Status: DC

## 2020-10-06 NOTE — Telephone Encounter (Signed)
*  STAT* If patient is at the pharmacy, call can be transferred to refill team.   1. Which medications need to be refilled? (please list name of each medication and dose if known)  losartan (COZAAR) 50 MG tablet clopidogrel (PLAVIX) 75 MG tablet metoprolol tartrate (LOPRESSOR) 25 MG tablet   2. Which pharmacy/location (including street and city if local pharmacy) is medication to be sent to? [facility] [address] - [place], [place] - [address]   3. Do they need a 30 day or 90 day supply? 90 with refills   Patient is scheduled for f/u visit with Dr. Duke Salvia 04/06/21

## 2020-10-22 DIAGNOSIS — Z0000 Encounter for general adult medical examination without abnormal findings: Secondary | ICD-10-CM | POA: Diagnosis not present

## 2020-11-05 ENCOUNTER — Encounter: Payer: Self-pay | Admitting: Internal Medicine

## 2020-11-05 ENCOUNTER — Telehealth: Payer: Self-pay | Admitting: Internal Medicine

## 2020-11-05 ENCOUNTER — Telehealth (INDEPENDENT_AMBULATORY_CARE_PROVIDER_SITE_OTHER): Payer: Medicare Other | Admitting: Internal Medicine

## 2020-11-05 ENCOUNTER — Telehealth: Payer: Self-pay | Admitting: Licensed Clinical Social Worker

## 2020-11-05 DIAGNOSIS — I2510 Atherosclerotic heart disease of native coronary artery without angina pectoris: Secondary | ICD-10-CM | POA: Diagnosis not present

## 2020-11-05 DIAGNOSIS — I482 Chronic atrial fibrillation, unspecified: Secondary | ICD-10-CM

## 2020-11-05 DIAGNOSIS — E7849 Other hyperlipidemia: Secondary | ICD-10-CM | POA: Diagnosis not present

## 2020-11-05 MED ORDER — REPATHA SURECLICK 140 MG/ML ~~LOC~~ SOAJ
1.0000 | SUBCUTANEOUS | 11 refills | Status: DC
Start: 2020-11-05 — End: 2021-11-16

## 2020-11-05 NOTE — Telephone Encounter (Signed)
HIPAA compliant message left at [phone] for pt following referral from Herb Grays., RN. Will re-attempt again as able.   Octavio Graves, MSW, LCSW [facility] Heart/Vascular Care Navigation  [phone]

## 2020-11-05 NOTE — Patient Instructions (Signed)
Medication Instructions:  Dr. Rennis Golden would like you to START Repatha 140mg /mL - once every 14 days  *If you need a refill on your cardiac medications before your next appointment, please call your pharmacy*   Lab Work: FASTING lab work when able  FASTING lab work in 6 months to check cholesterol -- before next visit  If you have labs (blood work) drawn today and your tests are completely normal, you will receive your results only by: MyChart Message (if you have MyChart) OR . A paper copy in the mail If you have any lab test that is abnormal or we need to change your treatment, we will call you to review the results.   Testing/Procedures: NONE   Follow-Up: At [facility], you and your health needs are our priority.  As part of our continuing mission to provide you with exceptional heart care, we have created designated Provider Care Teams.  These Care Teams include your primary Cardiologist (physician) and Advanced Practice Providers (APPs -  Physician Assistants and Nurse Practitioners) who all work together to provide you with the care you need, when you need it.  We recommend signing up for the patient portal called "MyChart".  Sign up information is provided on this After Visit Summary.  MyChart is used to connect with patients for Virtual Visits (Telemedicine).  Patients are able to view lab/test results, encounter notes, upcoming appointments, etc.  Non-urgent messages can be sent to your provider as well.   To learn more about what you can do with MyChart, go to [facility].    Your next appointment:   6 month(s) - lipid clinic  The format for your next appointment:   In Person or Virtual  Provider:   [email or web address] Hilty, MD   Other Instructions

## 2020-11-05 NOTE — Telephone Encounter (Signed)
Called patient to review instructions from virtual visit with Dr. Rennis Golden  She was advised to complete fasting lab work as soon as possible to check cholesterol She said this could be done at the end of this month  She is aware Dr. Rennis Golden would like her to start Repatha - she is approved for healthwell grant (applied with patient on the phone)  Informed patient she will get a MyChart reminder in July to arrange a visit for October 2022 - will send her a lab reminder closer to appointment time.

## 2020-11-05 NOTE — Telephone Encounter (Signed)
Patient approved for healthwell foundation grant for repatha  [name] [id number] Pre-approval Date 11/05/2020 Patient Name Lindsay [name]  Received Date 11/05/2020 Fund Hypercholesterolemia - Medicare Access  Start Date 10/06/2020 Assistance Type Co-pay  End Date 10/05/2021 Is Pharmacy Card? Yes  Approval Date 11/05/2020 Status Approved  Time to Re-enroll? No Hold On Payments No      Pharmacy Card Information Pharmacy Card Id [id number]  Group [id number] PCN [id number]  BIN [id number] PROCESSOR [id number]  Card Activation Status OK-2020-11-05 14:36:17  Financial Summary Information Grant Cap Amount $2,500.00 Paid Amount $0.00 Grant Balance $2,500.00 Total Payments In Progress $0.00 Last Paid Date   Total Paper Paid $0.00

## 2020-11-05 NOTE — Progress Notes (Signed)
Virtual Visit via Telephone Note   This visit type was conducted due to national recommendations for restrictions regarding the COVID-19 Pandemic (e.g. social distancing) in an effort to limit this patient's exposure and mitigate transmission in our community.  Due to her co-morbid illnesses, this patient is at least at moderate risk for complications without adequate follow up.  This format is felt to be most appropriate for this patient at this time.  The patient did not have access to video technology/had technical difficulties with video requiring transitioning to audio format only (telephone).  All issues noted in this document were discussed and addressed.  No physical exam could be performed with this format.  Please refer to the patient's chart for her  consent to telehealth for [facility].   Date:  11/05/2020   ID:  Lindsay [name], DOB [birth year], MRN [id number] The patient was identified using 2 identifiers.  Evaluation Performed:  Follow-Up Visit  Patient Location:  [address] [place] [address]  Provider location:   [address], [address] [place], [place] [address]  PCP:  Jarrett SohoWharton, Courtney, PA-C  Cardiologist:  Chilton Siiffany [place], MD Electrophysiologist:  None   Chief Complaint: Follow-up dyslipidemia  History of Present Illness:    Lindsay [name] is a 65 y.o. female who presents via audio/video conferencing for a telehealth visit today.  This is a pleasant 65 year old female who presents for evaluation and management of dyslipidemia.  Past medical history significant for atrial fibrillation, coronary artery disease with prior intervention, myocardial infarction, hypertension and bipolar disorder.  She previously worked as an Tree surgeonartist and recently did some retraining at [employer] for history.  She is followed closely by Dr. Duke Salviaandolph and noted to have a dyslipidemia above target LDL less than 70 but has been intolerant to statins and ezetimibe causing significant  myalgias.  Her last lipid profile is actually quite old from March 2020 showing total cholesterol 287, HDL 59, LDL of 204 and triglycerides 121.  She reports a varied diet not necessarily low in saturated fats.  There is a strong family history of heart disease and heart attacks mostly on the mother side of the family including several aunts and uncles which is highly suggestive of familial hyperlipidemia.  11/05/2020  Lindsay [name] had follow-up today via telephone visit.  She reports she was unable to get Repatha due to her insurance not covering it.  She also did not get her lab work as requested.  She has significant dyslipidemia, probably familial hyperlipidemia.  She says she eats a fairly healthy diet.  She struggled with a leg wound which she said was related to a scratch or bite from her cat.  She then had numerous other issues to discuss today.  Overall she seemed quite tangential.  She does have a history of bipolar disorder.  We discussed options at this point and I felt that she might be a candidate for the health well foundation.  That grant did not have funding last year and may have been a reason why we were not able to get her the medication.  Hopefully she could qualify for that.  I think a commercial, FDA approved treatment would be the best option.  Follow-up for a clinical trial is not feasible.  She will get repeat lipids however she said she will have to wait several weeks for her to get enough money to pay for the gas in order to come to [place].   The patient does not have symptoms concerning  for COVID-19 infection (fever, chills, cough, or new SHORTNESS OF BREATH).    Prior CV studies:   The following studies were reviewed today:  Chart reviewed, labwork  PMHx:  Past Medical History:  Diagnosis Date  . Anginal pain (HCC)   . Atrial fibrillation (HCC)   . Bipolar disorder (HCC)   . Cataract    OU  . Coronary artery disease involving native coronary artery with angina  pectoris (HCC) 03/24/2015  . Dysrhythmia   . GERD (gastroesophageal reflux disease)   . History of appendectomy 1963  . Hypertension   . Hypertensive retinopathy    OU  . Myocardial infarction (HCC)    per pt due to Vit D deficiency  . Syncope 11/2019  . Vitamin D deficiency     Past Surgical History:  Procedure Laterality Date  . APPENDECTOMY    . CARDIAC CATHETERIZATION N/A 03/25/2015   Procedure: Left Heart Cath and Coronary Angiography;  Surgeon: Marykay Lex, MD;  Location: [facility] INVASIVE CV LAB;  Service: Cardiovascular;  Laterality: N/A;  . CORONARY STENT INTERVENTION N/A 10/09/2018   Procedure: CORONARY STENT INTERVENTION;  Surgeon: Corky Crafts, MD;  Location: [facility] INVASIVE CV LAB;  Service: Cardiovascular;  Laterality: N/A;  . DILATATION & CURETTAGE/HYSTEROSCOPY WITH MYOSURE N/A 05/19/2016   Procedure: DILATATION & CURETTAGE/HYSTEROSCOPY WITH MYOSURE;  Surgeon: Geryl Rankins, MD;  Location: [facility] ORS;  Service: Gynecology;  Laterality: N/A;  Possible Myosure for polyps.  [name] EYE SURGERY    . LEFT HEART CATH AND CORONARY ANGIOGRAPHY N/A 10/09/2018   Procedure: LEFT HEART CATH AND CORONARY ANGIOGRAPHY;  Surgeon: Corky Crafts, MD;  Location: [facility] INVASIVE CV LAB;  Service: Cardiovascular;  Laterality: N/A;  . MOUTH SURGERY    . URETHRAL DILATION      FAMHx:  Family History  Problem Relation Age of Onset  . Vascular Disease Mother   . Dementia Mother   . Heart attack Maternal Grandmother   . Heart attack Maternal Grandfather   . Kidney failure Paternal Grandmother   . Heart attack Paternal Grandfather     SOCHx:   reports that she has never smoked. She has never used smokeless tobacco. She reports current alcohol use of about 2.0 [name] drinks of alcohol per week. She reports that she does not use drugs.  ALLERGIES:  Allergies  Allergen Reactions  . Sulfa Antibiotics Swelling and Rash  . Ciprofloxacin Nausea And Vomiting  . Depakote [Divalproex Sodium]  Other (See Comments)    Reaction not recalled- "it was 20 years ago"  . Pine Itching  . Risperidone Other (See Comments)    Unknown reaction  . Seroquel [Quetiapine Fumarate] Other (See Comments)    VIVID nightmares  . Statins Other (See Comments)    joint pain, weakness  . Tramadol Other (See Comments)    Serotonin syndrome  . Citalopram Rash and Other (See Comments)    Insomnia and agitation also  . Lamotrigine Itching, Rash and Other (See Comments)    Stevens-Johnson syndrome and Brown urine, also  . Other Other (See Comments) and Cough    Cat dander  . Topiramate Rash and Other (See Comments)    Nightmares, brown urine also  . Zetia [Ezetimibe]     Joint pain, weakness     MEDS:  Current Meds  Medication Sig  . acetaminophen (TYLENOL) 325 MG tablet Take 2 tablets (650 mg total) by mouth every 6 (six) hours as needed for mild pain (or Fever >/= 101).  [name] amLODipine (  NORVASC) 2.5 MG tablet TAKE ONE TABLET BY MOUTH ONE TIME DAILY  . b complex vitamins tablet Take 1 tablet by mouth daily.  [name] BIOTIN PO Take 1 tablet by mouth daily.  . chlorhexidine (PERIDEX) 0.12 % solution Use as directed 15 mLs in the mouth or throat daily.  . Cholecalciferol (VITAMIN D3) 125 MCG (5000 UT) CAPS Take 5,000 Units by mouth daily.  . clopidogrel (PLAVIX) 75 MG tablet Take 1 tablet (75 mg total) by mouth daily.  . Coenzyme Q10 (COQ-10 PO) Take 1 capsule by mouth daily.   . Cyanocobalamin (B-12 PO) Take 1 tablet by mouth 2 (two) times a week.   . diclofenac Sodium (VOLTAREN) 1 % GEL Apply topically as needed.  [name] esomeprazole (NEXIUM 24HR CLEAR MINIS) 20 MG capsule Take 1 capsule (20 mg total) by mouth daily before breakfast.  . Evolocumab (REPATHA SURECLICK) 140 MG/ML SOAJ Inject 1 Dose into the skin every 14 (fourteen) days.  . fluticasone (FLONASE) 50 MCG/ACT nasal spray Place 1-2 sprays into both nostrils daily.  . hydrOXYzine (VISTARIL) 50 MG capsule Take 50 mg by mouth at bedtime as needed for  anxiety (sleep).   . losartan (COZAAR) 50 MG tablet Take 1 tablet (50 mg total) by mouth daily.  . metoprolol tartrate (LOPRESSOR) 25 MG tablet TAKE ONE-HALF TABLET BY MOUTH TWICE A DAY  . nitroGLYCERIN (NITROSTAT) 0.4 MG SL tablet Place 1 tablet (0.4 mg total) under the tongue as needed for chest pain.  . Omega-3 Fatty Acids (OMEGA 3 PO) Take 1 capsule by mouth daily.  [name] POTASSIUM PO Take 1 tablet by mouth 2 (two) times a week.     ROS: Pertinent items noted in HPI and remainder of comprehensive ROS otherwise negative.  Labs/Other Tests and Data Reviewed:    Recent Labs: 12/18/2019: ALT 44; B Natriuretic Peptide 35.3; TSH 1.134 12/19/2019: BUN 17; Creatinine, Ser 0.81; Hemoglobin 10.4; Magnesium 1.9; Platelets 212; Potassium 3.9; Sodium 138   Recent Lipid Panel Lab Results  Component Value Date/Time   CHOL 308 (H) 04/25/2020 11:21 AM   TRIG 159 (H) 04/25/2020 11:21 AM   HDL 58 04/25/2020 11:21 AM   CHOLHDL 5.3 (H) 04/25/2020 11:21 AM   CHOLHDL 4.3 04/02/2016 03:54 PM   LDLCALC 220 (H) 04/25/2020 11:21 AM    Wt Readings from Last 3 Encounters:  04/25/20 193 lb (87.5 kg)  04/11/20 190 lb (86.2 kg)  01/02/20 189 lb 12.8 oz (86.1 kg)     Exam:    Vital Signs:  There were no vitals taken for this visit.   Not performed due to telephone visit  ASSESSMENT & PLAN:    1. Probable familial hyperlipidemia 2. Coronary artery disease with prior PCI 3. History of myocardial infarction 4. Statin and ezetimibe intolerance-myalgias  Lindsay [name] was not able to get started on PCSK9 inhibitor apparently due to cost issues.  She also did not have her lab work checked.  She says she has not really got out of her house much except for trying to nurse a leg wound for which she was seen in the hospital for.  She had numerous issues today and was quite tangential.  I am concerned this could be her bipolar disorder.  We discussed some possible options with the health well grant and I think pursuing  an FDA approved therapy would be preferable to a research study which would require strict compliance.  I have encouraged her to get her lab work which will probably be several  weeks when she is able to for the gas to get to the office and will contact her to try to get her room for Repatha.  COVID-19 Education: The signs and symptoms of COVID-19 were discussed with the patient and how to seek care for testing (follow up with PCP or arrange E-visit).  The importance of social distancing was discussed today.  Patient Risk:   After full review of this patients clinical status, I feel that they are at least moderate risk at this time.  Time:   Today, I have spent 15 minutes with the patient with telehealth technology discussing dyslipidemia, g.     Medication Adjustments/Labs and Tests Ordered: Current medicines are reviewed at length with the patient today.  Concerns regarding medicines are outlined above.   Tests Ordered: No orders of the defined types were placed in this encounter.   Medication Changes: No orders of the defined types were placed in this encounter.   Disposition:  in 6 month(s)  Chrystie Nose, MD, [facility], FACP  [place]  [facility]  Medical Director of the Advanced Lipid Disorders &  Cardiovascular Risk Reduction Clinic Diplomate of the American Board of Clinical Lipidology Attending Cardiologist  Direct Dial: [phone]  Fax: [phone]  Website:  [email or web address]  Chrystie Nose, MD  11/05/2020 8:08 AM

## 2020-11-06 ENCOUNTER — Telehealth: Payer: Self-pay | Admitting: Internal Medicine

## 2020-11-06 ENCOUNTER — Telehealth: Payer: Self-pay | Admitting: Licensed Clinical Social Worker

## 2020-11-06 NOTE — Telephone Encounter (Signed)
PA Case: [id number], Status: Approved, Coverage Starts on: 11/06/2020 12:00:00 AM, Coverage Ends on: 05/05/2021 12:00:00 AM

## 2020-11-06 NOTE — Progress Notes (Signed)
Heart and Vascular Care Navigation  11/06/2020  Lindsay [name] May 06, 1956 [id number]  Reason for Referral:  Referral for financial and medical needs.  Engaged with patient by telephone for initial visit for Heart and Vascular Care Coordination.                                                                                                   Assessment:        LCSW received a call back from pt today [phone]). I introduced self, role, reason for call. Pt confirms home address, pcp, and her emergency contact remains her friend Lindsay [name]. She lives alone with her four cats. 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 She shares that she was told she made too much income for Medicaid/SNAP. She details other ongoing financial concerns related to needed dental work and Ambulance person.   Pt shares that she has a dx of bipolar and ETOH use/abuse. She used to see a psychiatrist regularly but due to transportation challenges and cost she stopped going. She has attended AA meetings since roughly 2008 and admits to some use but that he has for the most part over the past decade been able to maintain her sobriety. She is glad with covid restrictions easing that she may be able to return.   LCSW shared that I could get her in contact with the local [facility] office to address any questions related to her Medicare coverage and see if she is eligible for any additional assistance programs to assist her with current medical concerns. Pt also may be eligible for services through [facility] program. LCSW will also research additional home repair/financial support programs in the [facility] area. Pt also is eligible for [facility].                 HRT/VAS Care Coordination    Patients Home Cardiology Office  [facility]   Outpatient Care Team Social Worker   Social Worker Name: Octavio Graves, LCSW, [facility]   Living arrangements for the past 2 months Single Family Home   Lives with: Pets   Patient Current Insurance Coverage Traditional Medicare   Patient Has Concern With Paying Medical Bills Yes   Patient Concerns With Medical Bills ongoing medical care needs   Medical Bill Referrals: [facility] for assistance programs   Does Patient Have Prescription Coverage? No   Patient Prescription Assistance Programs Other   Other Assistance Programs Medications [facility]   Home Assistive Devices/Equipment None      Social History:  SDOH Screenings   Alcohol Screen: Not on file  Depression (KMM3-8): Not on file  Financial Resource Strain: High Risk  . Difficulty of Paying Living Expenses: Hard  Food Insecurity: No Food Insecurity  . Worried About Programme researcher, broadcasting/film/video in the Last Year: Never true  . Ran Out of Food in the Last Year: Never true  Housing: High Risk  . Last Housing Risk Score: 3  Physical Activity: Not on file  Social Connections: Not on file  Stress: Not on file  Tobacco Use: Low Risk   . Smoking Tobacco Use: Never Smoker  . Smokeless Tobacco Use: Never Used  Transportation Needs: Unmet Transportation Needs  . Lack of Transportation (Medical): Yes  . Lack of Transportation (Non-Medical): Yes    SDOH Interventions: Financial Resources:  Financial Strain Interventions: Other (Comment)- Passenger transport manager,    Food Insecurity:  Food Insecurity Interventions: Intervention Not Indicated  Housing Insecurity:  Housing Interventions: Other (Comment)- Crisis Art gallery manager, [facility]:   Transportation Interventions: [employer]    Other Care Navigation Interventions:     Inpatient/Outpatient Substance Abuse Counseling/Rehab Options Pt  already active in AA per her report  Provided Pharmacy assistance resources Other- [facility] program  Patient expressed Mental Health concerns Yes, Referred to:  sent psychiatry resources ([facility])   Follow-up plan:   LCSW will send resources to pt email ([email or web address]) per her request. I have let her know prior to sending email I would give her a call so she can be aware it is coming to her. Pt has my name and number for any additional questions/concerns that may arise.

## 2020-11-06 NOTE — Telephone Encounter (Signed)
PA for repatha sureclick submitted via CMM (Key: [id number])

## 2020-11-10 ENCOUNTER — Telehealth: Payer: Self-pay | Admitting: Licensed Clinical Social Worker

## 2020-11-10 NOTE — Telephone Encounter (Signed)
LCSW sent resources via email to [email or web address]. I also called and left voicemail for pt at [phone] to share that I had sent the email and to remind her that I remain available for any additional questions/concerns.   I will f/u with pt 1-2 weeks from now.  Octavio Graves, MSW, LCSW [facility] Heart/Vascular Care Navigation  [phone]

## 2020-11-17 DIAGNOSIS — I2510 Atherosclerotic heart disease of native coronary artery without angina pectoris: Secondary | ICD-10-CM | POA: Diagnosis not present

## 2020-11-17 DIAGNOSIS — E7849 Other hyperlipidemia: Secondary | ICD-10-CM | POA: Diagnosis not present

## 2020-11-17 DIAGNOSIS — I482 Chronic atrial fibrillation, unspecified: Secondary | ICD-10-CM | POA: Diagnosis not present

## 2020-11-18 ENCOUNTER — Telehealth: Payer: Self-pay | Admitting: Licensed Clinical Social Worker

## 2020-11-18 LAB — LIPID PANEL
Chol/HDL Ratio: 5 ratio — ABNORMAL HIGH (ref 0.0–4.4)
Cholesterol, Total: 285 mg/dL — ABNORMAL HIGH (ref 100–199)
HDL: 57 mg/dL (ref >39)
LDL Chol Calc (NIH): 210 mg/dL — ABNORMAL HIGH (ref 0–99)
Triglycerides: 103 mg/dL (ref 0–149)
VLDL Cholesterol Cal: 18 mg/dL (ref 5–40)

## 2020-11-18 NOTE — Telephone Encounter (Signed)
Pt called and let me know she had gotten the resources I had sent and is working on starting some of those applications. She will reach back out if she has any questions/concerns.   Octavio Graves, MSW, LCSW [facility] Heart/Vascular Care Navigation  [phone]

## 2020-11-19 DIAGNOSIS — Z23 Encounter for immunization: Secondary | ICD-10-CM | POA: Diagnosis not present

## 2020-12-13 ENCOUNTER — Other Ambulatory Visit: Payer: Self-pay | Admitting: Cardiovascular Disease

## 2020-12-17 ENCOUNTER — Other Ambulatory Visit: Payer: Self-pay

## 2020-12-18 DIAGNOSIS — H838X3 Other specified diseases of inner ear, bilateral: Secondary | ICD-10-CM | POA: Diagnosis not present

## 2020-12-18 DIAGNOSIS — J342 Deviated nasal septum: Secondary | ICD-10-CM | POA: Diagnosis not present

## 2020-12-18 DIAGNOSIS — H9313 Tinnitus, bilateral: Secondary | ICD-10-CM | POA: Diagnosis not present

## 2020-12-18 DIAGNOSIS — J310 Chronic rhinitis: Secondary | ICD-10-CM | POA: Diagnosis not present

## 2020-12-18 DIAGNOSIS — H903 Sensorineural hearing loss, bilateral: Secondary | ICD-10-CM | POA: Diagnosis not present

## 2020-12-18 DIAGNOSIS — J343 Hypertrophy of nasal turbinates: Secondary | ICD-10-CM | POA: Diagnosis not present

## 2020-12-19 ENCOUNTER — Other Ambulatory Visit: Payer: Self-pay | Admitting: Otolaryngology

## 2020-12-19 DIAGNOSIS — J329 Chronic sinusitis, unspecified: Secondary | ICD-10-CM

## 2021-01-15 ENCOUNTER — Other Ambulatory Visit: Payer: Self-pay

## 2021-01-15 MED ORDER — METOPROLOL TARTRATE 25 MG PO TABS: ORAL_TABLET | ORAL | 3 refills | Status: DC

## 2021-01-23 ENCOUNTER — Ambulatory Visit
Admission: RE | Admit: 2021-01-23 | Discharge: 2021-01-23 | Disposition: A | Discharge status: Home / Self Care | Payer: Medicare Other | Source: Ambulatory Visit | Attending: Otolaryngology | Admitting: Otolaryngology

## 2021-01-23 DIAGNOSIS — M26603 Bilateral temporomandibular joint disorder, unspecified: Secondary | ICD-10-CM | POA: Diagnosis not present

## 2021-01-23 DIAGNOSIS — J329 Chronic sinusitis, unspecified: Secondary | ICD-10-CM

## 2021-01-23 DIAGNOSIS — J321 Chronic frontal sinusitis: Secondary | ICD-10-CM | POA: Diagnosis not present

## 2021-01-28 DIAGNOSIS — R5381 Other malaise: Secondary | ICD-10-CM | POA: Diagnosis not present

## 2021-01-28 DIAGNOSIS — F319 Bipolar disorder, unspecified: Secondary | ICD-10-CM | POA: Insufficient documentation

## 2021-01-28 DIAGNOSIS — F410 Panic disorder [episodic paroxysmal anxiety] without agoraphobia: Secondary | ICD-10-CM | POA: Diagnosis not present

## 2021-01-28 DIAGNOSIS — R079 Chest pain, unspecified: Secondary | ICD-10-CM | POA: Diagnosis not present

## 2021-02-11 ENCOUNTER — Other Ambulatory Visit: Payer: Self-pay | Admitting: Cardiovascular Disease

## 2021-03-13 IMAGING — CT CT CERVICAL SPINE W/O CM
3 of 4 series · 12 of 33 positions shown, 14 images · non-contrast
Comparison: CT 12/05/2019

CLINICAL DATA: Syncope, fall at home. Positive loss of
consciousness. Laceration to back of head.

EXAM:
CT CERVICAL SPINE WITHOUT CONTRAST
TECHNIQUE: Multidetector CT imaging of the cervical spine was performed without
intravenous contrast. Multiplanar CT image reconstructions were also
generated.

[Series 7: sag bone · sagittal · 0.25mm/px · 5 of 53 slices shown, 6 images]
[im 18/53  bone]
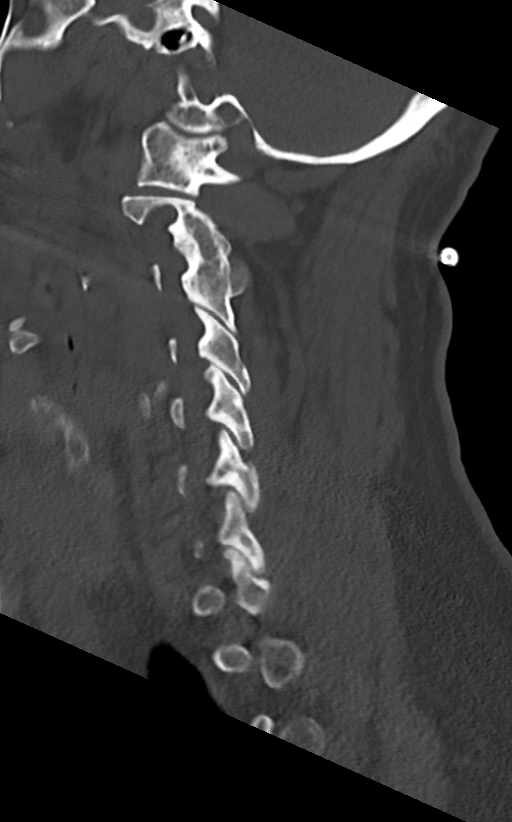
[im 22/53  bone]
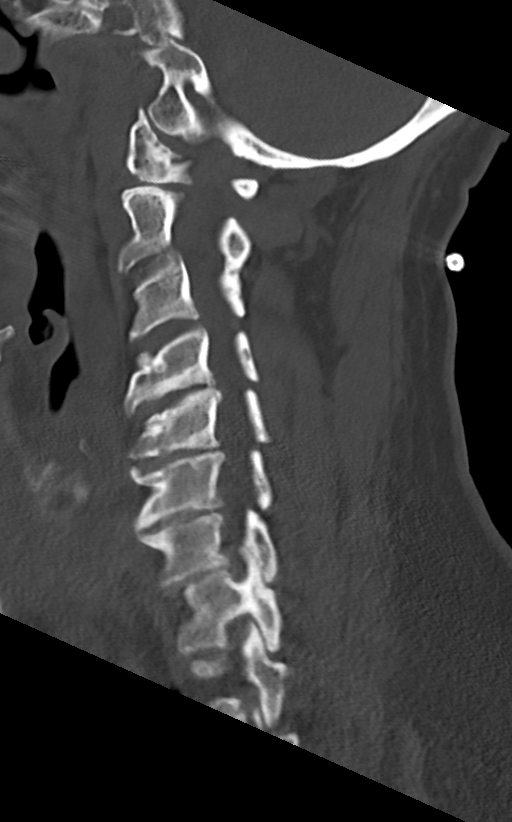
[im 27/53  soft-tissue]
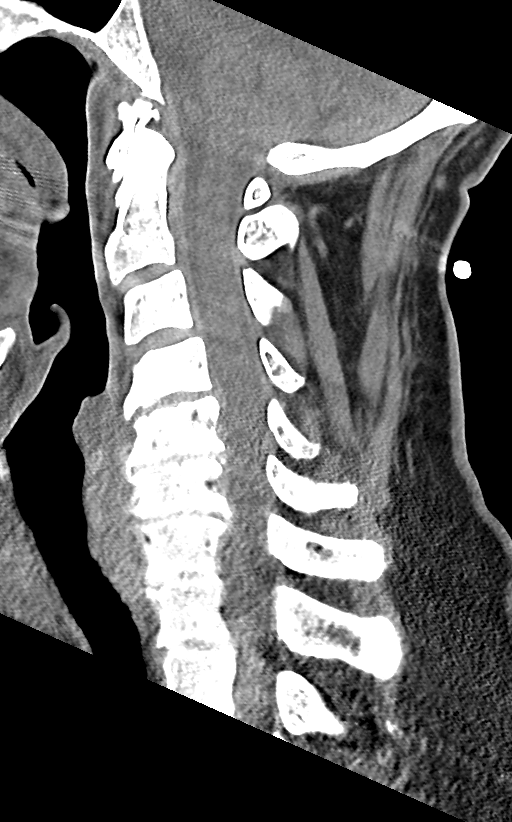
[im 27/53  bone]
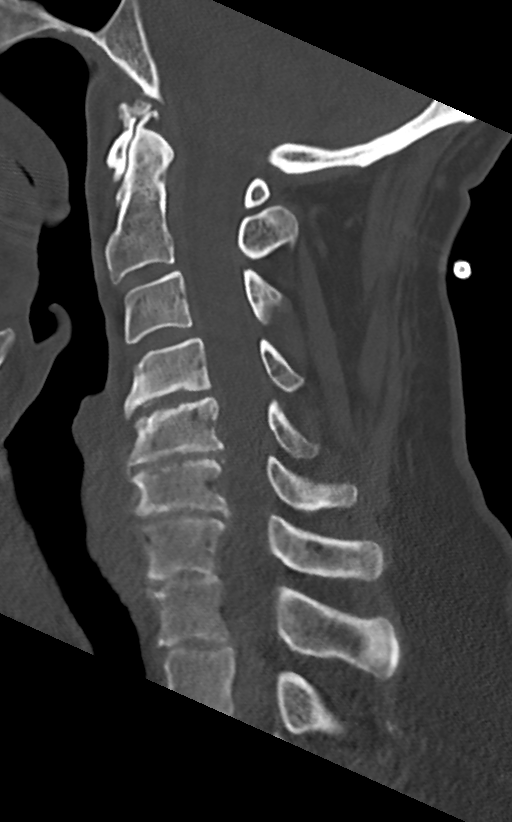
[im 31/53  bone]
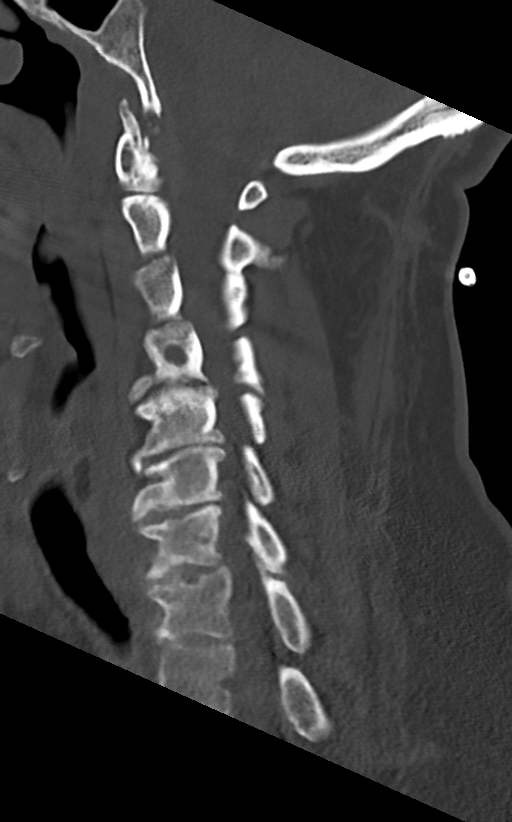
[im 35/53  bone]
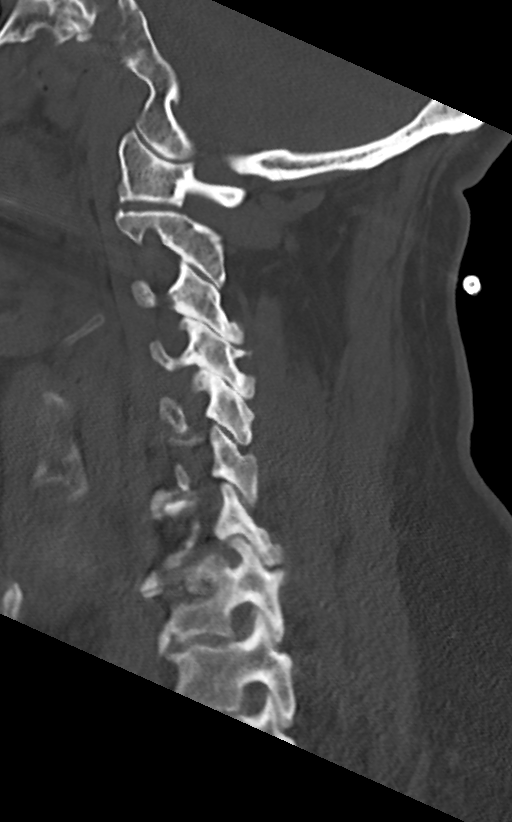

[Series 8: cor bone · coronal · 0.26mm/px · 3 of 59 slices shown]
[im 15/59  bone]
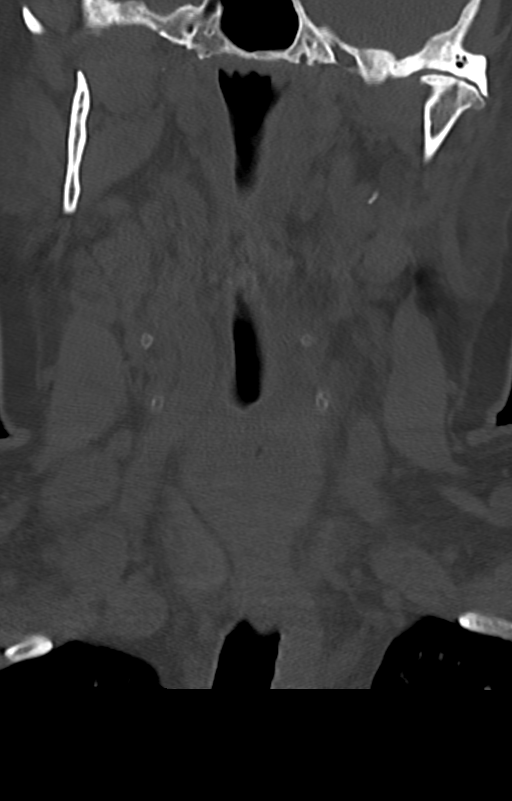
[im 25/59  bone]
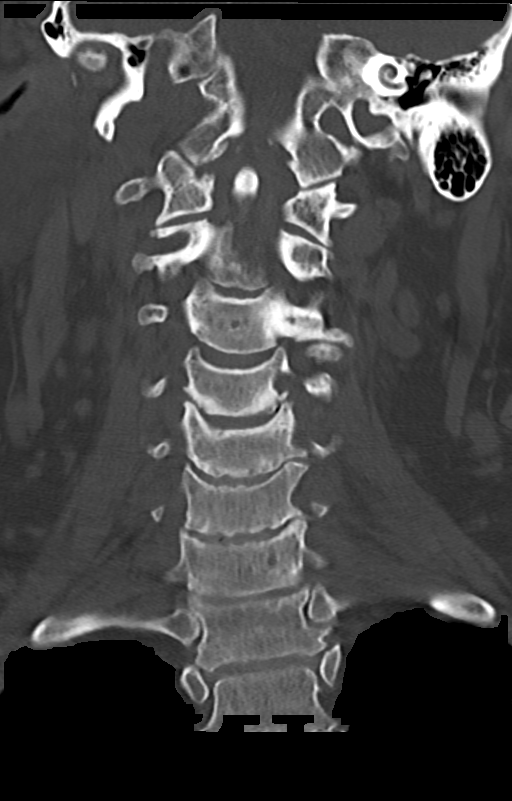
[im 35/59  bone]
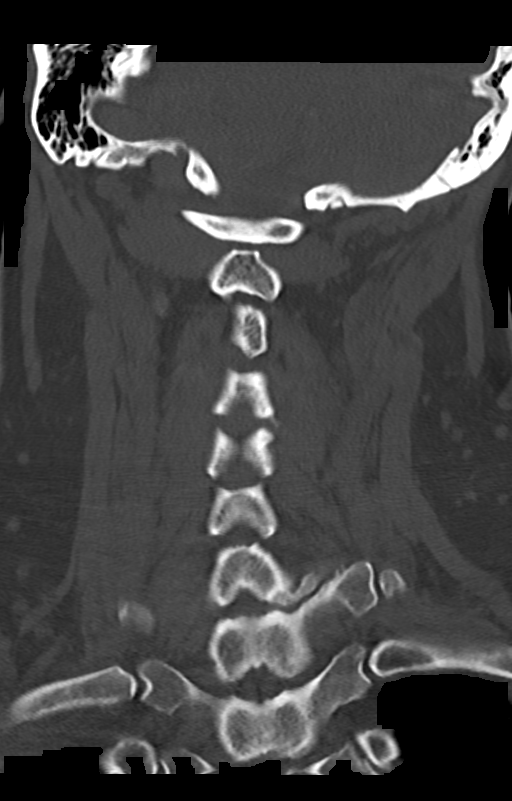

[Series 10: orthogonal axials · axial · 0.21mm/px · z∈[-204,-104]mm · 4 of 84 slices shown, 5 images]
[im 14/84  soft-tissue]
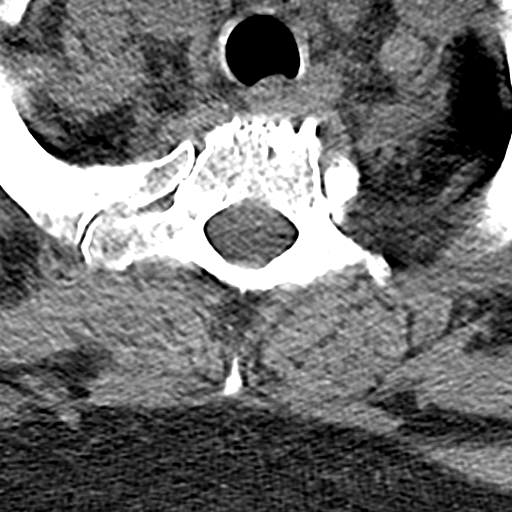
[im 14/84  bone]
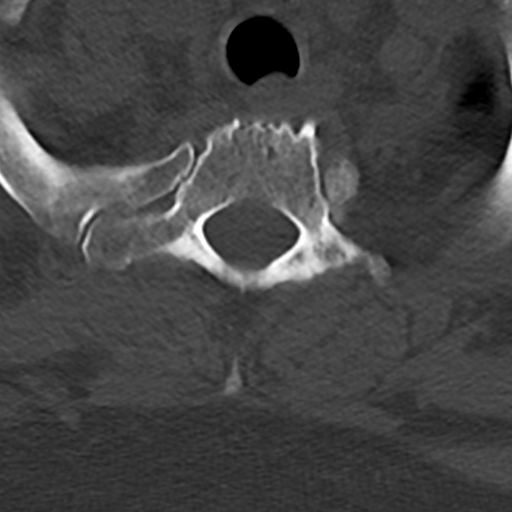
[im 28/84  bone]
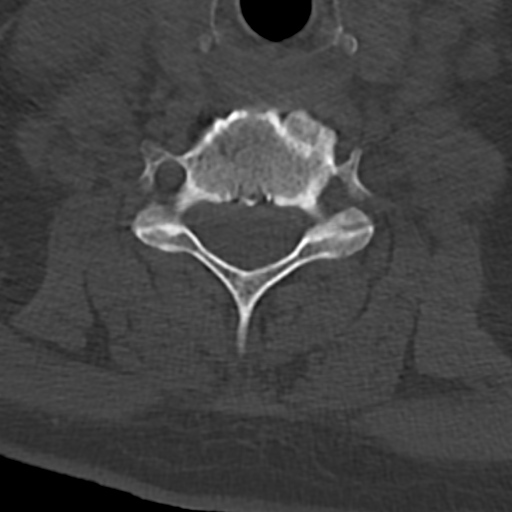
[im 56/84  bone]
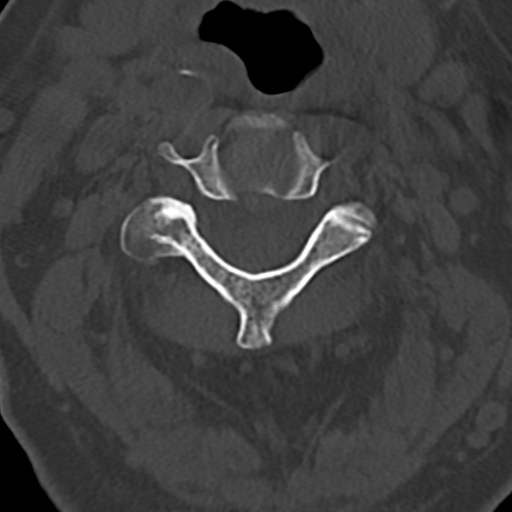
[im 70/84  bone]
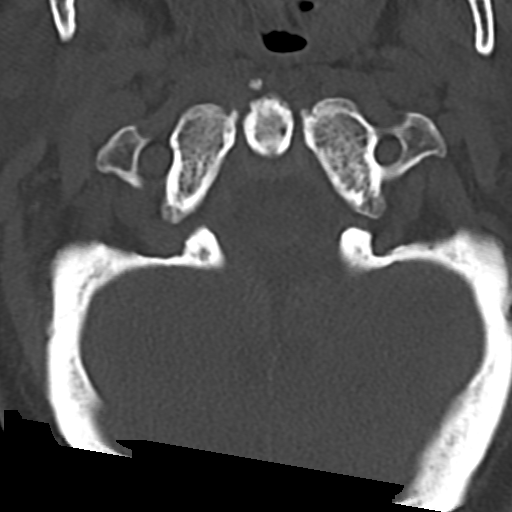

[12 of 33 positions shown; findings below may reference images not displayed]

FINDINGS: Alignment: Straightening of normal lordosis. No traumatic
subluxation.

Skull base and vertebrae: No acute fracture. Vertebral body heights
are maintained. The dens and skull base are intact.

Soft tissues and spinal canal: No prevertebral fluid or swelling. No
visible canal hematoma.

Disc levels: Unchanged degenerative disc disease and facet
hypertrophy. Disc space narrowing and endplate spurring most
prominent at C5-C6.

Upper chest: No acute findings.

Other: Mild carotid calcifications.
IMPRESSION: 1. No acute fracture or subluxation of the cervical spine.
2. Unchanged degenerative disc disease and facet hypertrophy.

## 2021-03-13 IMAGING — CR DG LUMBAR SPINE COMPLETE 4+V
5 series · 5 of 5 positions shown · non-contrast
Comparison: Lumbar radiograph 12/05/2019

CLINICAL DATA: Syncope, fall.

EXAM:
LUMBAR SPINE - COMPLETE 4+ VIEW

[l-spine ap]
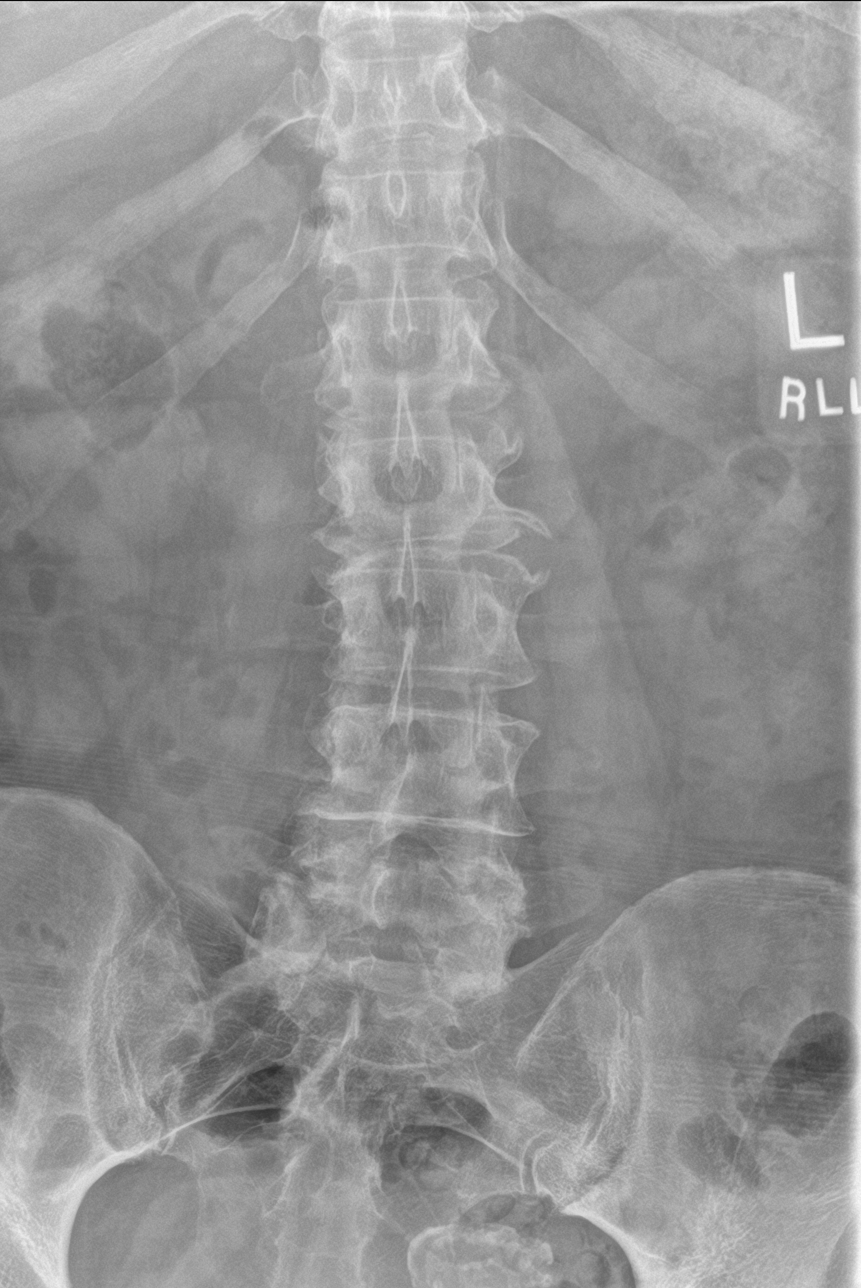

[l-spine obl (1 of 2)]
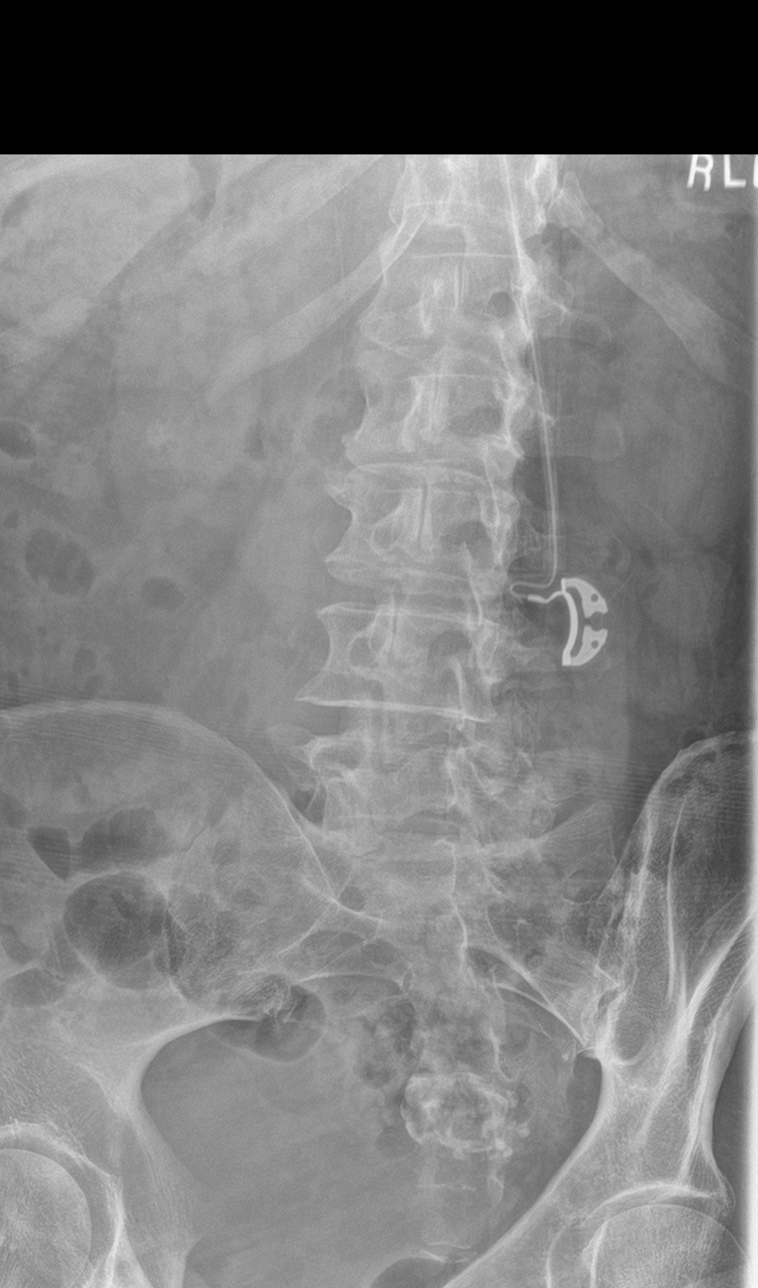

[l-spine obl (2 of 2)]
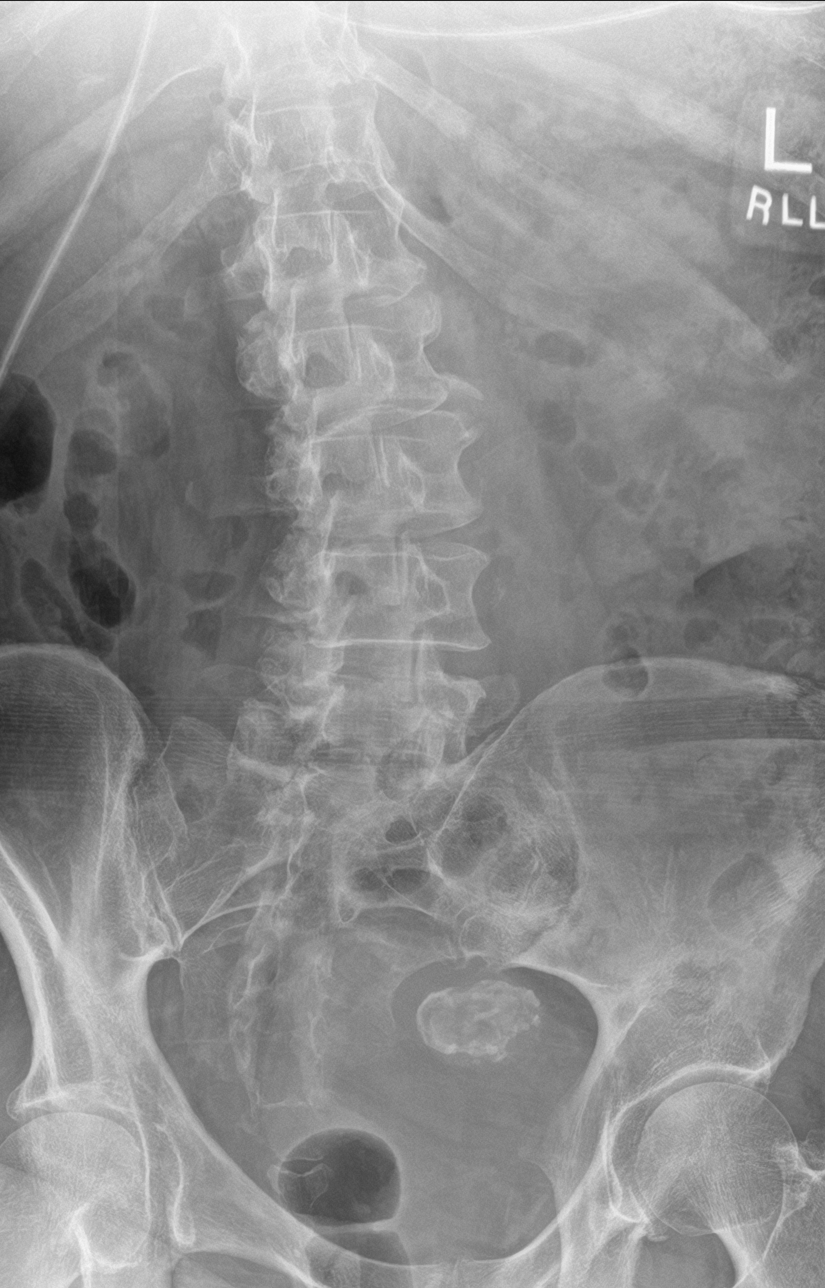

[l-spine lat]
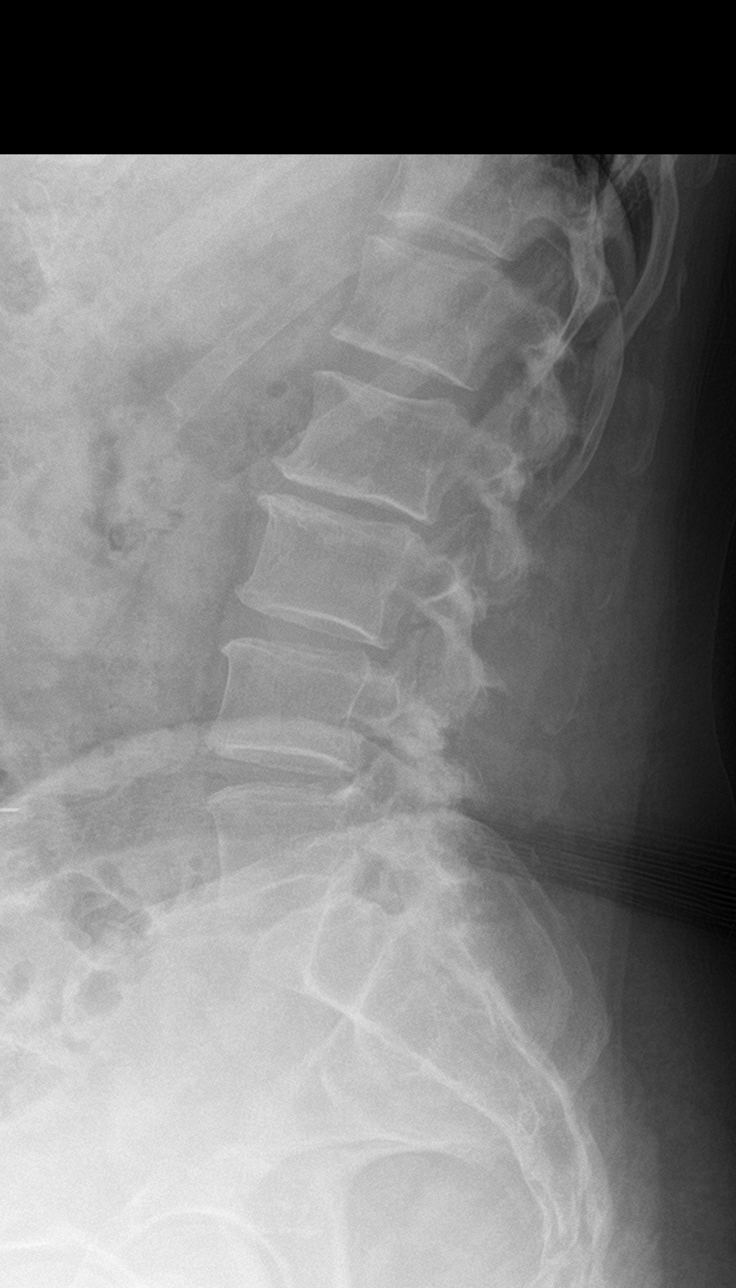

[l-spine spot]
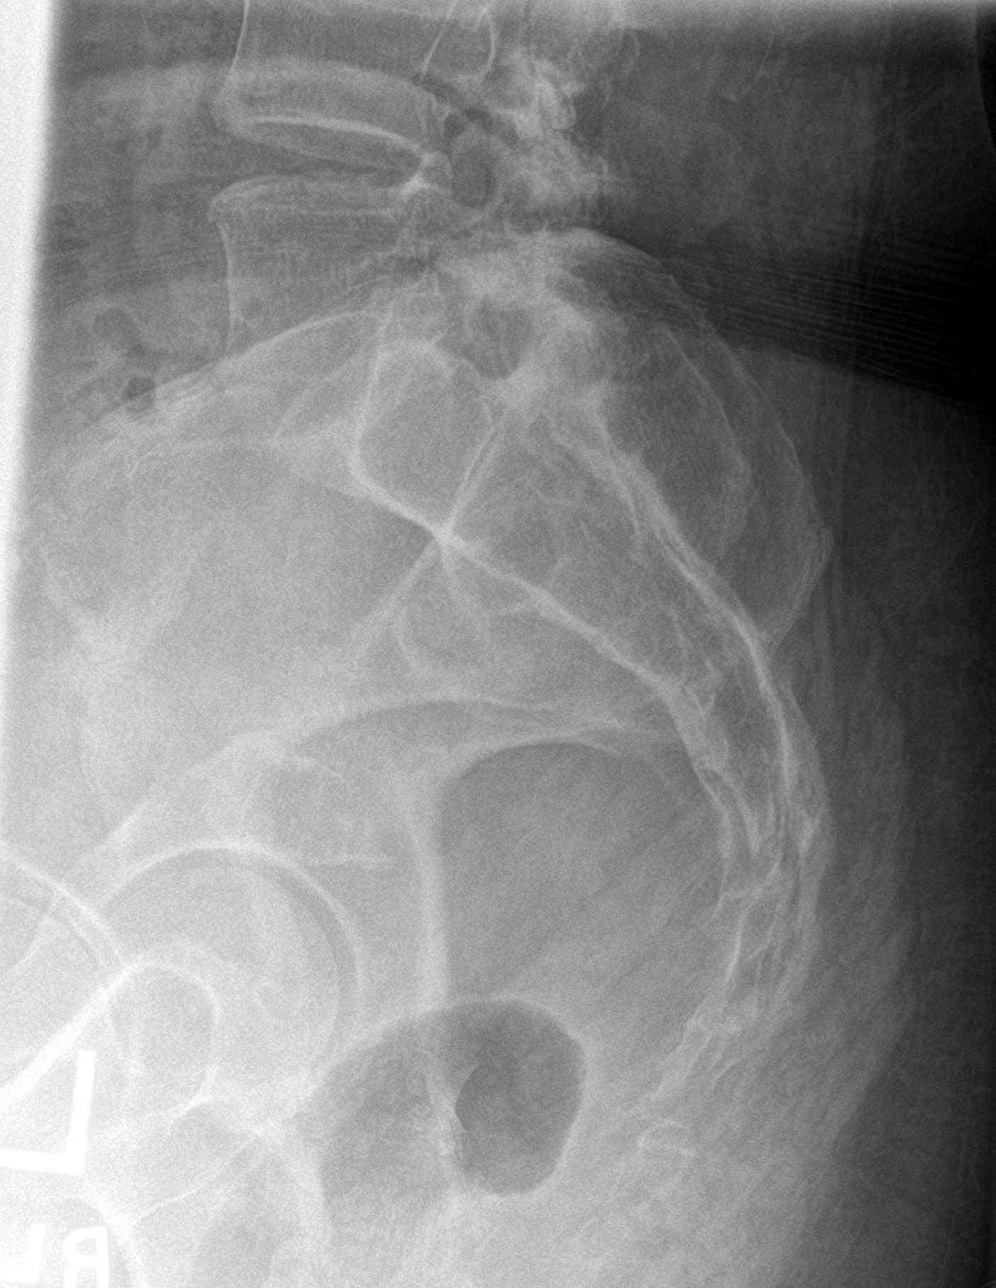

[5 of 5 positions shown; findings below may reference images not displayed]

FINDINGS: Mild broad-based levo scoliotic curvature which may be positional.
Previous anterolisthesis of L4 on L5 is no longer seen. No acute
fracture. Vertebral body heights are preserved. Mild multilevel
degenerative disc disease most prominent at L2-L3. There is
prominent facet hypertrophy at multiple levels. Sacroiliac joints
are congruent. Stable coarse calcification in the left pelvis,
presumed fibroid.
IMPRESSION: 1. No acute fracture of the lumbar spine.
2. Multilevel degenerative change, grossly stable from prior.

## 2021-04-06 ENCOUNTER — Encounter (HOSPITAL_BASED_OUTPATIENT_CLINIC_OR_DEPARTMENT_OTHER): Payer: Self-pay | Admitting: Cardiovascular Disease

## 2021-04-06 ENCOUNTER — Ambulatory Visit (INDEPENDENT_AMBULATORY_CARE_PROVIDER_SITE_OTHER): Payer: Medicare Other | Admitting: Cardiovascular Disease

## 2021-04-06 ENCOUNTER — Other Ambulatory Visit: Payer: Self-pay

## 2021-04-06 VITALS — BP 120/76 | HR 78 | Ht 62.0 in | Wt 191.8 lb

## 2021-04-06 DIAGNOSIS — I482 Chronic atrial fibrillation, unspecified: Secondary | ICD-10-CM | POA: Diagnosis not present

## 2021-04-06 DIAGNOSIS — E785 Hyperlipidemia, unspecified: Secondary | ICD-10-CM | POA: Diagnosis not present

## 2021-04-06 DIAGNOSIS — I2510 Atherosclerotic heart disease of native coronary artery without angina pectoris: Secondary | ICD-10-CM

## 2021-04-06 DIAGNOSIS — R413 Other amnesia: Secondary | ICD-10-CM | POA: Diagnosis not present

## 2021-04-06 DIAGNOSIS — I25119 Atherosclerotic heart disease of native coronary artery with unspecified angina pectoris: Secondary | ICD-10-CM | POA: Diagnosis not present

## 2021-04-06 DIAGNOSIS — I10 Essential (primary) hypertension: Secondary | ICD-10-CM | POA: Diagnosis not present

## 2021-04-06 DIAGNOSIS — E782 Mixed hyperlipidemia: Secondary | ICD-10-CM | POA: Diagnosis not present

## 2021-04-06 NOTE — Assessment & Plan Note (Signed)
No angina.  Continue clopidogrel, metoprolol and Repatha.  She will go get fasting lipids/CMP.  LDL goal is <70.
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
## 2021-04-06 NOTE — Assessment & Plan Note (Signed)
Maintaining sinus rhythm.  She ahsn't been anticoagulated due to GI bleed.  Consider Watchman.  Continue metoprolol.

## 2021-04-06 NOTE — Assessment & Plan Note (Signed)
Continue Repatha.  She will come for lipids/CMP.

## 2021-04-06 NOTE — Patient Instructions (Addendum)
Medication Instructions:  Your physician recommends that you continue on your current medications as directed. Please refer to the Current Medication list given to you today.   *If you need a refill on your cardiac medications before your next appointment, please call your pharmacy*  Lab Work: FASTING LP/CMET SOON   If you have labs (blood work) drawn today and your tests are completely normal, you will receive your results only by: MyChart Message (if you have MyChart) OR A paper copy in the mail If you have any lab test that is abnormal or we need to change your treatment, we will call you to review the results.  Testing/Procedures: NONE   Follow-Up: At [facility], you and your health needs are our priority.  As part of our continuing mission to provide you with exceptional heart care, we have created designated Provider Care Teams.  These Care Teams include your primary Cardiologist (physician) and Advanced Practice Providers (APPs -  Physician Assistants and Nurse Practitioners) who all work together to provide you with the care you need, when you need it.  We recommend signing up for the patient portal called "MyChart".  Sign up information is provided on this After Visit Summary.  MyChart is used to connect with patients for Virtual Visits (Telemedicine).  Patients are able to view lab/test results, encounter notes, upcoming appointments, etc.  Non-urgent messages can be sent to your provider as well.   To learn more about what you can do with MyChart, go to [email or web address].    Your next appointment:   6 months   The format for your next appointment:   In Person  Provider:   Gillian Shields, NP  Your physician wants you to follow-up in: 12 months with Dr Fara Chute will receive a reminder letter in the mail two months in advance. If you don't receive a letter, please call our office to schedule the follow-up appointment.  You have been referred to   Where:  [facility] Address: [address] [place], [address] [place] [address] Phone: [phone]  IF YOU DO NOT HEAR FROM THE OFFICE IN 2 WEEKS YOU CAN CALL THEM DIRECTLY AT THE NUMBER LISTED ABOVE

## 2021-04-06 NOTE — Assessment & Plan Note (Signed)
BP well-controlled.  Continue amlodipine, losartan and metoprolol.

## 2021-04-08 ENCOUNTER — Encounter: Payer: Self-pay | Admitting: Physician Assistant

## 2021-04-08 ENCOUNTER — Other Ambulatory Visit (INDEPENDENT_AMBULATORY_CARE_PROVIDER_SITE_OTHER): Payer: Medicare Other

## 2021-04-08 ENCOUNTER — Ambulatory Visit (INDEPENDENT_AMBULATORY_CARE_PROVIDER_SITE_OTHER): Payer: Medicare Other | Admitting: Physician Assistant

## 2021-04-08 ENCOUNTER — Telehealth (HOSPITAL_BASED_OUTPATIENT_CLINIC_OR_DEPARTMENT_OTHER): Payer: Self-pay | Admitting: Internal Medicine

## 2021-04-08 ENCOUNTER — Other Ambulatory Visit: Payer: Self-pay

## 2021-04-08 VITALS — BP 145/90 | HR 71 | Resp 18 | Ht 62.0 in | Wt 191.0 lb

## 2021-04-08 DIAGNOSIS — R413 Other amnesia: Secondary | ICD-10-CM

## 2021-04-08 DIAGNOSIS — I2510 Atherosclerotic heart disease of native coronary artery without angina pectoris: Secondary | ICD-10-CM

## 2021-04-08 LAB — VITAMIN B12: Vitamin B-12: 1317 pg/mL — ABNORMAL HIGH (ref 211–911)
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
## 2021-04-08 NOTE — Telephone Encounter (Signed)
Received fax from [facility] stating that Repatha has been approved through 07/25/21.  Called pt to let her know. Pt verbalized thanks for the call.

## 2021-04-08 NOTE — Telephone Encounter (Signed)
Lindsay [name] (Key: [id number]) Repatha SureClick 140MG /ML auto-injectors   Form Electronic PA Form  Created 2 days ago  Sent to Plan 9 minutes ago  Plan Response 9 minutes ago  Submit Clinical Questions less than a minute ago  Determination Wait for Determination Please wait for [facility] NCPDP 2017 to return a determination.

## 2021-04-08 NOTE — Patient Instructions (Addendum)
It was a pleasure to see you today at our office.   Recommendations:  Neurocognitive evaluation at our office MRI of the brain, the radiology office will call you to arrange you appointment Check labs today Follow up once the results of the above are available   RECOMMENDATIONS FOR ALL PATIENTS WITH MEMORY PROBLEMS: 1. Continue to exercise (Recommend 30 minutes of walking everyday, or 3 hours every week) 2. Increase social interactions - continue going to Church and enjoy social gatherings with friends and family 3. Eat healthy, avoid fried foods and eat more fruits and vegetables 4. Maintain adequate blood pressure, blood sugar, and blood cholesterol level. Reducing the risk of stroke and cardiovascular disease also helps promoting better memory. 5. Avoid stressful situations. Live a simple life and avoid aggravations. Organize your time and prepare for the next day in anticipation. 6. Sleep well, avoid any interruptions of sleep and avoid any distractions in the bedroom that may interfere with adequate sleep quality 7. Avoid sugar, avoid sweets as there is a strong link between excessive sugar intake, diabetes, and cognitive impairment We discussed the Mediterranean diet, which has been shown to help patients reduce the risk of progressive memory disorders and reduces cardiovascular risk. This includes eating fish, eat fruits and green leafy vegetables, nuts like almonds and hazelnuts, walnuts, and also use olive oil. Avoid fast foods and fried foods as much as possible. Avoid sweets and sugar as sugar use has been linked to worsening of memory function.  There is always a concern of gradual progression of memory problems. If this is the case, then we may need to adjust level of care according to patient needs. Support, both to the patient and caregiver, should then be put into place.      You have been referred for a neuropsychological evaluation (i.e., evaluation of memory and thinking  abilities). Please bring someone with you to this appointment if possible, as it is helpful for the doctor to hear from both you and another adult who knows you well. Please bring eyeglasses and hearing aids if you wear them.    The evaluation will take approximately 3 hours and has two parts:   The first part is a clinical interview with the neuropsychologist (Dr. Merz or Dr. Stewart). During the interview, the neuropsychologist will speak with you and the individual you brought to the appointment.    The second part of the evaluation is testing with the doctor's technician (Dana or Kim). During the testing, the technician will ask you to remember different types of material, solve problems, and answer some questionnaires. Your family member will not be present for this portion of the evaluation.   Please note: We must reserve several hours of the neuropsychologist's time and the psychometrician's time for your evaluation appointment. As such, there is a No-Show fee of $100. If you are unable to attend any of your appointments, please contact our office as soon as possible to reschedule.    FALL PRECAUTIONS: Be cautious when walking. Scan the area for obstacles that may increase the risk of trips and falls. When getting up in the mornings, sit up at the edge of the bed for a few minutes before getting out of bed. Consider elevating the bed at the head end to avoid drop of blood pressure when getting up. Walk always in a well-lit room (use night lights in the walls). Avoid area rugs or power cords from appliances in the middle of the walkways. Use a walker   or a cane if necessary and consider physical therapy for balance exercise. Get your eyesight checked regularly.  FINANCIAL OVERSIGHT: Supervision, especially oversight when making financial decisions or transactions is also recommended.  HOME SAFETY: Consider the safety of the kitchen when operating appliances like stoves, microwave oven, and  blender. Consider having supervision and share cooking responsibilities until no longer able to participate in those. Accidents with firearms and other hazards in the house should be identified and addressed as well.   ABILITY TO BE LEFT ALONE: If patient is unable to contact 911 operator, consider using LifeLine, or when the need is there, arrange for someone to stay with patients. Smoking is a fire hazard, consider supervision or cessation. Risk of wandering should be assessed by caregiver and if detected at any point, supervision and safe proof recommendations should be instituted.  MEDICATION SUPERVISION: Inability to self-administer medication needs to be constantly addressed. Implement a mechanism to ensure safe administration of the medications.   DRIVING: Regarding driving, in patients with progressive memory problems, driving will be impaired. We advise to have someone else do the driving if trouble finding directions or if minor accidents are reported. Independent driving assessment is available to determine safety of driving.   If you are interested in the driving assessment, you can contact the following:  [employer] in [place] [phone]  [facility] [phone]  [facility] [phone]  [facility] [phone] or [phone]    Mediterranean Diet A Mediterranean diet refers to food and lifestyle choices that are based on the traditions of countries located on the [place] Sea. This way of eating has been shown to help prevent certain conditions and improve outcomes for people who have chronic diseases, like kidney disease and heart disease. What are tips for following this plan? Lifestyle  Cook and eat meals together with your family, when possible. Drink enough fluid to keep your urine clear or pale yellow. Be physically active every day. This includes: Aerobic exercise like running or swimming. Leisure  activities like gardening, walking, or housework. Get 7-8 hours of sleep each night. If recommended by your health care provider, drink red wine in moderation. This means 1 glass a day for nonpregnant women and 2 glasses a day for men. A glass of wine equals 5 oz (150 mL). Reading food labels  Check the serving size of packaged foods. For foods such as rice and pasta, the serving size refers to the amount of cooked product, not dry. Check the total fat in packaged foods. Avoid foods that have saturated fat or trans fats. Check the ingredients list for added sugars, such as corn syrup. Shopping  At the grocery store, buy most of your food from the areas near the walls of the store. This includes: Fresh fruits and vegetables (produce). Grains, beans, nuts, and seeds. Some of these may be available in unpackaged forms or large amounts (in bulk). Fresh seafood. Poultry and eggs. Low-fat dairy products. Buy whole ingredients instead of prepackaged foods. Buy fresh fruits and vegetables in-season from local farmers markets. Buy frozen fruits and vegetables in resealable bags. If you do not have access to quality fresh seafood, buy precooked frozen shrimp or canned fish, such as tuna, salmon, or sardines. Buy small amounts of raw or cooked vegetables, salads, or olives from the deli or salad bar at your store. Stock your pantry so you always have certain foods on hand, such as olive oil, canned tuna, canned tomatoes, rice, pasta, and beans.   Cooking  Cook foods with extra-virgin olive oil instead of using butter or other vegetable oils. Have meat as a side dish, and have vegetables or grains as your main dish. This means having meat in small portions or adding small amounts of meat to foods like pasta or stew. Use beans or vegetables instead of meat in common dishes like chili or lasagna. Experiment with different cooking methods. Try roasting or broiling vegetables instead of steaming or sauteing  them. Add frozen vegetables to soups, stews, pasta, or rice. Add nuts or seeds for added healthy fat at each meal. You can add these to yogurt, salads, or vegetable dishes. Marinate fish or vegetables using olive oil, lemon juice, garlic, and fresh herbs. Meal planning  Plan to eat 1 vegetarian meal one day each week. Try to work up to 2 vegetarian meals, if possible. Eat seafood 2 or more times a week. Have healthy snacks readily available, such as: Vegetable sticks with hummus. Greek yogurt. Fruit and nut trail mix. Eat balanced meals throughout the week. This includes: Fruit: 2-3 servings a day Vegetables: 4-5 servings a day Low-fat dairy: 2 servings a day Fish, poultry, or lean meat: 1 serving a day Beans and legumes: 2 or more servings a week Nuts and seeds: 1-2 servings a day Whole grains: 6-8 servings a day Extra-virgin olive oil: 3-4 servings a day Limit red meat and sweets to only a few servings a month What are my food choices? Mediterranean diet Recommended Grains: Whole-grain pasta. Brown rice. Bulgar wheat. Polenta. Couscous. Whole-wheat bread. Oatmeal. Quinoa. Vegetables: Artichokes. Beets. Broccoli. Cabbage. Carrots. Eggplant. Green beans. Chard. Kale. Spinach. Onions. Leeks. Peas. Squash. Tomatoes. Peppers. Radishes. Fruits: Apples. Apricots. Avocado. Berries. Bananas. Cherries. Dates. Figs. Grapes. Lemons. Melon. Oranges. Peaches. Plums. Pomegranate. Meats and other protein foods: Beans. Almonds. Sunflower seeds. Pine nuts. Peanuts. Cod. Salmon. Scallops. Shrimp. Tuna. Tilapia. Clams. Oysters. Eggs. Dairy: Low-fat milk. Cheese. Greek yogurt. Beverages: Water. Red wine. Herbal tea. Fats and oils: Extra virgin olive oil. Avocado oil. Grape seed oil. Sweets and desserts: Greek yogurt with honey. Baked apples. Poached pears. Trail mix. Seasoning and other foods: Basil. Cilantro. Coriander. Cumin. Mint. Parsley. Sage. Rosemary. Tarragon. Garlic. Oregano. Thyme. Pepper.  Balsalmic vinegar. Tahini. Hummus. Tomato sauce. Olives. Mushrooms. Limit these Grains: Prepackaged pasta or rice dishes. Prepackaged cereal with added sugar. Vegetables: Deep fried potatoes (french fries). Fruits: Fruit canned in syrup. Meats and other protein foods: Beef. Pork. Lamb. Poultry with skin. Hot dogs. Bacon. Dairy: Ice cream. Sour cream. Whole milk. Beverages: Juice. Sugar-sweetened soft drinks. Beer. Liquor and spirits. Fats and oils: Butter. Canola oil. Vegetable oil. Beef fat (tallow). Lard. Sweets and desserts: Cookies. Cakes. Pies. Candy. Seasoning and other foods: Mayonnaise. Premade sauces and marinades. The items listed may not be a complete list. Talk with your dietitian about what dietary choices are right for you. Summary The Mediterranean diet includes both food and lifestyle choices. Eat a variety of fresh fruits and vegetables, beans, nuts, seeds, and whole grains. Limit the amount of red meat and sweets that you eat. Talk with your health care provider about whether it is safe for you to drink red wine in moderation. This means 1 glass a day for nonpregnant women and 2 glasses a day for men. A glass of wine equals 5 oz (150 mL). This information is not intended to replace advice given to you by your health care provider. Make sure you discuss any questions you have with your health care provider. Document Released:   03/04/2016 Document Revised: 04/06/2016 Document Reviewed: 03/04/2016 Elsevier Interactive Patient Education  2017 Elsevier Inc.   We have sent a referral to [facility] for your MRI and they will call you directly to schedule your appointment. They are located at [address] If you need to contact them directly please call [phone].   Your provider has requested that you have labwork completed today. Please go to [facility] ([id number]) on the second floor of this building before leaving the office today. You do not need  to check in. If you are not called within 15 minutes please check with the front desk.     

## 2021-04-09 ENCOUNTER — Telehealth: Payer: Self-pay

## 2021-04-09 NOTE — Telephone Encounter (Signed)
Called to discuss PREP program, left voicemail, asking for return call. 

## 2021-04-10 ENCOUNTER — Telehealth: Payer: Self-pay

## 2021-04-10 NOTE — Telephone Encounter (Signed)
Returning my call from 9/15 re: PREP program; she wants to attend, prefers [facility]; will contact her early October for next classes beginning at [place] mid-late October.

## 2021-04-11 LAB — VITAMIN B1: Vitamin B1 (Thiamine): 32 nmol/L — ABNORMAL HIGH (ref 8–30)

## 2021-04-12 ENCOUNTER — Other Ambulatory Visit: Payer: Self-pay | Admitting: Cardiovascular Disease

## 2021-04-13 NOTE — Telephone Encounter (Signed)
Rx(s) sent to pharmacy electronically.  

## 2021-04-14 ENCOUNTER — Other Ambulatory Visit: Payer: Self-pay | Admitting: *Deleted

## 2021-04-14 MED ORDER — LOSARTAN POTASSIUM 50 MG PO TABS: 50.0000 mg | ORAL_TABLET | Freq: Every day | ORAL | 2 refills | Status: DC

## 2021-04-28 ENCOUNTER — Telehealth: Payer: Self-pay

## 2021-04-28 NOTE — Telephone Encounter (Signed)
Called to discuss PREP class schedule, she wants to attend next class 10/18 every T/Th 12-1:15 at [facility]; assessment visit scheduled 10/11 at 1pm.

## 2021-05-02 ENCOUNTER — Ambulatory Visit
Admission: RE | Admit: 2021-05-02 | Discharge: 2021-05-02 | Disposition: A | Discharge status: Home / Self Care | Payer: Medicare Other | Source: Ambulatory Visit | Attending: Physician Assistant | Admitting: Physician Assistant

## 2021-05-02 DIAGNOSIS — G319 Degenerative disease of nervous system, unspecified: Secondary | ICD-10-CM | POA: Diagnosis not present

## 2021-05-02 DIAGNOSIS — I6782 Cerebral ischemia: Secondary | ICD-10-CM | POA: Diagnosis not present

## 2021-05-02 DIAGNOSIS — R413 Other amnesia: Secondary | ICD-10-CM | POA: Diagnosis not present

## 2021-05-05 ENCOUNTER — Ambulatory Visit: Payer: Medicare Other | Attending: Internal Medicine

## 2021-05-05 ENCOUNTER — Other Ambulatory Visit (HOSPITAL_BASED_OUTPATIENT_CLINIC_OR_DEPARTMENT_OTHER): Payer: Self-pay

## 2021-05-05 DIAGNOSIS — Z23 Encounter for immunization: Secondary | ICD-10-CM

## 2021-05-05 MED ORDER — PFIZER COVID-19 VAC BIVALENT 30 MCG/0.3ML IM SUSP
INTRAMUSCULAR | 0 refills | Status: DC
  Filled 2021-05-05: qty 0.3, 1d supply, fill #0

## 2021-05-05 NOTE — Progress Notes (Signed)
[facility] PREP Evaluation  Patient Details  Name: Lindsay [name] MRN: [id number] Date of Birth: 05-17-1956 Age: 65 y.o. PCP: Jarrett Soho, PA-C  Vitals:   05/05/21 1353  BP: 126/80  Pulse: 72  SpO2: 97%  Weight: 192 lb 6.4 oz (87.3 kg)     [facility] Eval - 05/05/21 1300       [facility] "PREP" Location   [facility] "PREP" Location [facility]      Referral    Referring Provider Duke Salvia    Reason for referral High Cholesterol;Hypertension;Inactivity;Obesitity/Overweight    Program Start Date 05/12/21      Measurement   Waist Circumference 39.5 inches    Hip Circumference 41.5 inches    Body fat 44.8 percent      Information for Trainer   Goals --   Incorporate stetch and strength training into exercise program; lose 10 lbs by end of 12 weeks   Current Exercise none    Orthopedic Concerns --   OA bilat knees and hands; cervical disc strain   Pertinent Medical History --   HTN, OSA, Afib, MI, GERD, OA   Current Barriers --   work possibly   Medications that affect exercise Beta blocker      Timed Up and Go (TUGS)   Timed Up and Go Low risk <9 seconds      Mobility and Daily Activities   I find it easy to walk up or down two or more flights of stairs. 1    I have no trouble taking out the trash. 2    I do housework such as vacuuming and dusting on my own without difficulty. 3    I can easily lift a gallon of milk (8lbs). 4    I can easily walk a mile. 1    I have no trouble reaching into high cupboards or reaching down to pick up something from the floor. 3    I do not have trouble doing out-door work such as Loss adjuster, chartered, raking leaves, or gardening. 4      Mobility and Daily Activities   I feel younger than my age. 1    I feel independent. 3    I feel energetic. 2    I live an active life.  1    I feel strong. 1    I feel healthy. 2    I feel active as other people my age. 2      How fit and strong are you.   Fit and Strong Total Score 30            Past Medical  History:  Diagnosis Date   Anginal pain (HCC)    Atrial fibrillation (HCC)    Bipolar disorder (HCC)    Cataract    OU   Coronary artery disease involving native coronary artery with angina pectoris (HCC) 03/24/2015   Dysrhythmia    GERD (gastroesophageal reflux disease)    History of appendectomy 1963   Hypertension    Hypertensive retinopathy    OU   Myocardial infarction (HCC)    per pt due to Vit D deficiency   Syncope 11/2019   Vitamin D deficiency    Past Surgical History:  Procedure Laterality Date   APPENDECTOMY     CARDIAC CATHETERIZATION N/A 03/25/2015   Procedure: Left Heart Cath and Coronary Angiography;  Surgeon: Marykay Lex, MD;  Location: [facility] INVASIVE CV LAB;  Service: Cardiovascular;  Laterality: N/A;   CORONARY STENT INTERVENTION N/A  10/09/2018   Procedure: CORONARY STENT INTERVENTION;  Surgeon: Corky Crafts, MD;  Location: [facility] INVASIVE CV LAB;  Service: Cardiovascular;  Laterality: N/A;   DILATATION & CURETTAGE/HYSTEROSCOPY WITH MYOSURE N/A 05/19/2016   Procedure: DILATATION & CURETTAGE/HYSTEROSCOPY WITH MYOSURE;  Surgeon: Geryl Rankins, MD;  Location: [facility];  Service: Gynecology;  Laterality: N/A;  Possible Myosure for polyps.   EYE SURGERY     LEFT HEART CATH AND CORONARY ANGIOGRAPHY N/A 10/09/2018   Procedure: LEFT HEART CATH AND CORONARY ANGIOGRAPHY;  Surgeon: Corky Crafts, MD;  Location: [facility] INVASIVE CV LAB;  Service: Cardiovascular;  Laterality: N/A;   MOUTH SURGERY     URETHRAL DILATION     Social History   Tobacco Use  Smoking Status Never  Smokeless Tobacco Never  Assessment visit today, also administered flu shot; to begin PREP at [facility] October 18, every T/Th from 12-1:15 for 12 weeks  Sonia Baller 05/05/2021, 1:58 PM

## 2021-05-05 NOTE — Progress Notes (Signed)
   Covid-19 Vaccination Clinic  Name:  Lindsay [name]    MRN: [id number] DOB: August 26, 1955  05/05/2021  [name] was observed post Covid-19 immunization for 15 minutes without incident. She was provided with Vaccine Information Sheet and instruction to access the V-Safe system.   [name] was instructed to call 911 with any severe reactions post vaccine: Difficulty breathing  Swelling of face and throat  A fast heartbeat  A bad rash all over body  Dizziness and weakness

## 2021-05-12 ENCOUNTER — Other Ambulatory Visit: Payer: Self-pay | Admitting: Cardiovascular Disease

## 2021-05-12 NOTE — Progress Notes (Signed)
[facility] PREP Weekly Session  Patient Details  Name: Lindsay [name] MRN: [id number] Date of Birth: 1955/09/11 Age: 65 y.o. PCP: Jarrett Soho, PA-C  There were no vitals filed for this visit.   [facility] Weekly seesion - 05/12/21 1400       [facility] "PREP" Location   [facility] "PREP" Location [facility]      Weekly Session   Topic Discussed Goal setting and welcome to the program   Introductions, review of PREP notebook, weekly self reports, tour of facility            Sonia Baller 05/12/2021, 2:15 PM

## 2021-05-26 NOTE — Progress Notes (Signed)
[facility] PREP Weekly Session  Patient Details  Name: Lindsay [name] MRN: [id number] Date of Birth: 10-01-1955 Age: 65 y.o. PCP: Jarrett Soho, PA-C  Vitals:   05/26/21 1525  Weight: 193 lb 9.6 oz (87.8 kg)     [facility] Weekly seesion - 05/26/21 1500       [facility] "PREP" Location   [facility] "PREP[facility] Location [facility]      Weekly Session   Topic Discussed Healthy eating tips    Minutes exercised this week 200 minutes    Classes attended to date 5             Lindsay [name] 05/26/2021, 3:25 PM

## 2021-06-01 DIAGNOSIS — Z23 Encounter for immunization: Secondary | ICD-10-CM | POA: Diagnosis not present

## 2021-06-09 NOTE — Progress Notes (Signed)
[facility] PREP Weekly Session  Patient Details  Name: Lindsay [name] MRN: [id number] Date of Birth: Oct 21, 1955 Age: 65 y.o. PCP: Jarrett Soho, PA-C  Vitals:   06/09/21 1537  Weight: 190 lb 6.4 oz (86.4 kg)     [facility] Weekly seesion - 06/09/21 1500       [facility] "PREP" Location   [facility] "PREP[facility] Location [facility]      Weekly Session   Topic Discussed Restaurant Eating   salt demo; NA intake limited to  1500-2300mg    Minutes exercised this week 160 minutes    Classes attended to date 7             Sonia Baller 06/09/2021, 3:38 PM

## 2021-06-11 ENCOUNTER — Other Ambulatory Visit: Payer: Self-pay | Admitting: Cardiovascular Disease

## 2021-06-23 NOTE — Progress Notes (Signed)
[facility] PREP Weekly Session  Patient Details  Name: Lindsay [name] MRN: [id number] Date of Birth: 1955/12/09 Age: 65 y.o. PCP: Jarrett Soho, PA-C  Vitals:   06/23/21 1600  Weight: 192 lb 9.6 oz (87.4 kg)     [facility] Weekly seesion - 06/23/21 1600       [facility] "PREP" Location   [facility] "PREP[facility] Location [facility]      Weekly Session   Topic Discussed Expectations and non-scale victories   Halfway through program; encouraged to review, refocus, revisit, restate goals   Classes attended to date 26             Florette Thai B Baruc Tugwell 06/23/2021, 4:01 PM

## 2021-07-07 NOTE — Progress Notes (Signed)
[facility] PREP Weekly Session  Patient Details  Name: Lindsay [name] MRN: [id number] Date of Birth: 08/27/1955 Age: 65 y.o. PCP: Jarrett Soho, PA-C  There were no vitals filed for this visit.   [facility] Weekly seesion - 07/07/21 1300       [facility] "PREP" Location   [facility] "PREP[facility] Location [facility]      Weekly Session   Topic Discussed Finding support   Review of several food labels, both nutrition and ingredients.   Classes attended to date 57             Asjia Berrios B Cassiel Fernandez 07/07/2021, 1:17 PM

## 2021-07-14 NOTE — Progress Notes (Signed)
[facility] PREP Weekly Session  Patient Details  Name: Lindsay [name] MRN: [id number] Date of Birth: June 13, 1956 Age: 65 y.o. PCP: Jarrett Soho, PA-C  Vitals:   07/14/21 1330  Weight: 191 lb (86.6 kg)     [facility] Weekly seesion - 07/14/21 1300       [facility] "PREP" Location   [facility] "PREP" Location [facility]      Weekly Session   Topic Discussed Calorie breakdown   Membership talk/options with Y staff   Minutes exercised this week 300 minutes    Classes attended to date 21             Lindsay [name] 07/14/2021, 1:33 PM

## 2021-07-21 ENCOUNTER — Telehealth: Payer: Self-pay | Admitting: Internal Medicine

## 2021-07-21 NOTE — Telephone Encounter (Signed)
°*  STAT* If patient is at the pharmacy, call can be transferred to refill team.   1. Which medications need to be refilled? (please list name of each medication and dose if known) nitroGLYCERIN (NITROSTAT) 0.4 MG SL tablet  2. Which pharmacy/location (including street and city if local pharmacy) is medication to be sent to? [facility] [address] - [place], [place] - [address]  3. Do they need a 30 day or 90 day supply? 25

## 2021-07-22 MED ORDER — NITROGLYCERIN 0.4 MG SL SUBL: 0.4000 mg | SUBLINGUAL_TABLET | SUBLINGUAL | 99 refills | Status: DC | PRN

## 2021-07-30 ENCOUNTER — Telehealth: Payer: Self-pay | Admitting: Internal Medicine

## 2021-07-30 NOTE — Telephone Encounter (Signed)
PA for repatha sureclick attempted in [facility] Authorization already on file for this request.  Authorization starting on 07/26/2020 and ending on 07/25/2022.

## 2021-08-06 ENCOUNTER — Telehealth: Payer: Self-pay

## 2021-08-06 NOTE — Telephone Encounter (Signed)
Unable to make final assessment visit today for PREP, car battery dead; asked her to contact me for follow up appointment to complete final assessment.

## 2021-08-07 ENCOUNTER — Telehealth: Payer: Self-pay | Admitting: Internal Medicine

## 2021-08-07 NOTE — Telephone Encounter (Signed)
Left message for pt to call back  °

## 2021-08-07 NOTE — Telephone Encounter (Signed)
Pt wants to know if [facility] can put another lab order. Pt said she went to have her blood work done back in 03/2021 and it was really painful, she wants to know it there's another way her blood draw can be done beside using a needle.

## 2021-08-10 NOTE — Telephone Encounter (Signed)
Left message for pt to call.

## 2021-08-11 NOTE — Progress Notes (Unsigned)
[facility] PREP Evaluation  Patient Details  Name: Lindsay [name] MRN: [id number] Date of Birth: 01-31-1956 Age: 66 y.o. PCP: Jarrett Soho, PA-C  Vitals:   08/11/21 1015  BP: 130/70  Pulse: 81  SpO2: 98%  Weight: 195 lb 6.4 oz (88.6 kg)     [facility] Eval - 08/11/21 1000       [facility] "PREP" Location   [facility] "PREP" Location [facility]      Referral    Program Start Date 08/06/21   program end date     Measurement   Waist Circumference 39.5 inches    Hip Circumference 44 inches    Body fat 44.2 percent      Mobility and Daily Activities   I find it easy to walk up or down two or more flights of stairs. 3    I have no trouble taking out the trash. 3    I do housework such as vacuuming and dusting on my own without difficulty. 4    I can easily lift a gallon of milk (8lbs). 3    I can easily walk a mile. 2    I have no trouble reaching into high cupboards or reaching down to pick up something from the floor. 3    I do not have trouble doing out-door work such as Loss adjuster, chartered, raking leaves, or gardening. 2      Mobility and Daily Activities   I feel younger than my age. 2    I feel independent. 3    I feel energetic. 2    I live an active life.  1    I feel strong. 2    I feel healthy. 3    I feel active as other people my age. 3      How fit and strong are you.   Fit and Strong Total Score 36            Past Medical History:  Diagnosis Date   Anginal pain (HCC)    Atrial fibrillation (HCC)    Bipolar disorder (HCC)    Cataract    OU   Coronary artery disease involving native coronary artery with angina pectoris (HCC) 03/24/2015   Dysrhythmia    GERD (gastroesophageal reflux disease)    History of appendectomy 1963   Hypertension    Hypertensive retinopathy    OU   Myocardial infarction (HCC)    per pt due to Vit D deficiency   Syncope 11/2019   Vitamin D deficiency    Past Surgical History:  Procedure Laterality Date   APPENDECTOMY     CARDIAC  CATHETERIZATION N/A 03/25/2015   Procedure: Left Heart Cath and Coronary Angiography;  Surgeon: Marykay Lex, MD;  Location: [facility] INVASIVE CV LAB;  Service: Cardiovascular;  Laterality: N/A;   CORONARY STENT INTERVENTION N/A 10/09/2018   Procedure: CORONARY STENT INTERVENTION;  Surgeon: Corky Crafts, MD;  Location: [facility] INVASIVE CV LAB;  Service: Cardiovascular;  Laterality: N/A;   DILATATION & CURETTAGE/HYSTEROSCOPY WITH MYOSURE N/A 05/19/2016   Procedure: DILATATION & CURETTAGE/HYSTEROSCOPY WITH MYOSURE;  Surgeon: Geryl Rankins, MD;  Location: [facility];  Service: Gynecology;  Laterality: N/A;  Possible Myosure for polyps.   EYE SURGERY     LEFT HEART CATH AND CORONARY ANGIOGRAPHY N/A 10/09/2018   Procedure: LEFT HEART CATH AND CORONARY ANGIOGRAPHY;  Surgeon: Corky Crafts, MD;  Location: [facility] INVASIVE CV LAB;  Service: Cardiovascular;  Laterality: N/A;   MOUTH SURGERY  URETHRAL DILATION     Social History   Tobacco Use  Smoking Status Never  Smokeless Tobacco Never  Missed last couple of classes, returned today for final assessment visit. Education sessions attended: 7 Workout sessions completed: 7 How fit and strong survey: 05/05/21: 30 08/11/21: 36  Izak Anding B Delenn Ahn 08/11/2021, 10:18 AM

## 2021-08-12 NOTE — Telephone Encounter (Signed)
Returned call to patient who states she had difficulty having her labs drawn at a [facility] in [place]. Patient states that she was stuck a couple of times with no success. Patient states she may not have been well hydrated. Advised patient to hydrate well and return to [facility] office to have labs drawn when able. Patient aware of all instructions and address to office. Patient verbalized understanding.

## 2021-09-25 ENCOUNTER — Ambulatory Visit (INDEPENDENT_AMBULATORY_CARE_PROVIDER_SITE_OTHER): Payer: Medicare Other | Admitting: Family

## 2021-09-25 ENCOUNTER — Encounter: Payer: Self-pay | Admitting: Psychology

## 2021-09-25 ENCOUNTER — Ambulatory Visit (INDEPENDENT_AMBULATORY_CARE_PROVIDER_SITE_OTHER): Payer: Medicare Other | Admitting: Psychology

## 2021-09-25 DIAGNOSIS — R413 Other amnesia: Secondary | ICD-10-CM

## 2021-09-25 DIAGNOSIS — Z029 Encounter for administrative examinations, unspecified: Secondary | ICD-10-CM

## 2021-09-25 DIAGNOSIS — I25118 Atherosclerotic heart disease of native coronary artery with other forms of angina pectoris: Secondary | ICD-10-CM

## 2021-09-25 NOTE — Progress Notes (Signed)
Error - patient rescheduled.  ? ?Lindsay [name]   ?

## 2021-09-25 NOTE — Progress Notes (Signed)
? ?  Neuropsychology Note ?[place] ?[place] Department of Neurology ? ?  ? ?Ms. Cowgill called at 8:57am the morning of her 8:30am appointment stating that her alarm did not go off and she would be unable to attend. She was rescheduled for 05/26/2022. ? ?  ? ?

## 2021-09-28 ENCOUNTER — Emergency Department (INDEPENDENT_AMBULATORY_CARE_PROVIDER_SITE_OTHER)
Admission: EM | Admit: 2021-09-28 | Discharge: 2021-09-28 | Disposition: A | Discharge status: Home / Self Care | Payer: Medicare Other | Source: Home / Self Care

## 2021-09-28 ENCOUNTER — Other Ambulatory Visit: Payer: Self-pay

## 2021-09-28 DIAGNOSIS — R059 Cough, unspecified: Secondary | ICD-10-CM

## 2021-09-28 DIAGNOSIS — J0100 Acute maxillary sinusitis, unspecified: Secondary | ICD-10-CM

## 2021-09-28 DIAGNOSIS — J309 Allergic rhinitis, unspecified: Secondary | ICD-10-CM | POA: Diagnosis not present

## 2021-09-28 MED ORDER — FEXOFENADINE HCL 180 MG PO TABS: 180.0000 mg | ORAL_TABLET | Freq: Every day | ORAL | 0 refills | Status: DC

## 2021-09-28 MED ORDER — BENZONATATE 200 MG PO CAPS: 200.0000 mg | ORAL_CAPSULE | Freq: Three times a day (TID) | ORAL | 0 refills | Status: AC | PRN

## 2021-09-28 MED ORDER — AMOXICILLIN-POT CLAVULANATE 875-125 MG PO TABS: 1.0000 | ORAL_TABLET | Freq: Two times a day (BID) | ORAL | 0 refills | Status: AC

## 2021-09-28 NOTE — ED Triage Notes (Signed)
Pt presents to Urgent Care with c/o cough, nasal congestion, and fatigue x approx 1 week. Reports nausea and diarrhea at time of symptom onset, now subsided. Did not do a COVID test.  ?

## 2021-09-28 NOTE — Discharge Instructions (Addendum)
Advised patient to take medication as directed with food to completion.  Advised patient to take Allegra with first dose of Augmentin for the next 5 of 10 days.  Advised may use Allegra as needed afterwards for concurrent postnasal drainage/drip.  Advised patient may use Tessalon Perles daily or as needed for cough. Encouraged patient to increase daily water intake while taking these medications. ?

## 2021-09-28 NOTE — ED Provider Notes (Signed)
Lindsay [name] CARE    CSN: [id number] Arrival date & time: 09/28/21  1343      History   Chief Complaint Chief Complaint  Patient presents with   Cough   Nasal Congestion    HPI Lindsay [name] is a 66 y.o. female.   HPI 66 year old female presents with cough, nasal congestion and fatigue for approximately 1 week.  PMH significant for non-STEMI, CAD, and atrial fibrillation. Patient is currently on Plavix and denies any unusual bleeding.  Past Medical History:  Diagnosis Date   Anginal pain ([place])    Atrial fibrillation ([place])    Bipolar disorder ([place])    Cataract    OU   Coronary artery disease involving native coronary artery with angina pectoris ([place]) 03/24/2015   Dysrhythmia    GERD (gastroesophageal reflux disease)    History of appendectomy 1963   Hypertension    Hypertensive retinopathy    OU   Myocardial infarction ([place])    per pt due to Vit D deficiency   Syncope 11/2019   Vitamin D deficiency     Patient Active Problem List   Diagnosis Date Noted   Recurrent syncope    Orthostasis    Coronary artery disease involving native coronary artery of native heart 12/18/2019   Syncope 12/18/2019   Lactic acidosis 12/18/2019   Hematemesis 12/18/2019   PAF (paroxysmal atrial fibrillation) ([place]) 10/09/2018   Dorsalgia 09/22/2018   Dysphagia 09/22/2018   GERD without esophagitis 09/22/2018   Irritable bowel syndrome without diarrhea 09/22/2018   Major depressive disorder, single episode 09/22/2018   Menopausal and perimenopausal disorder 09/22/2018   Migraine without status migrainosus, not intractable 09/22/2018   Obesity 09/22/2018   Osteoarthritis of knee 09/22/2018   Polypharmacy 09/22/2018   Sciatica of right side 09/22/2018   Vitamin D deficiency 09/22/2018   Mixed hyperlipidemia 05/07/2016   Alcohol intoxication ([place]) 09/08/2015   Chronic atrial fibrillation ([place]) 09/08/2015   Mood disorder ([place]) 09/08/2015   Coronary artery disease involving native  coronary artery with angina pectoris ([place]) 03/24/2015   Essential hypertension 03/03/2015   Chest pain 02/11/2015   Non-ST elevation MI (NSTEMI) - h/o 07/26/2008    Past Surgical History:  Procedure Laterality Date   APPENDECTOMY     CARDIAC CATHETERIZATION N/A 03/25/2015   Procedure: Left Heart Cath and Coronary Angiography;  Surgeon: Leonie Man, MD;  Location: [facility];  Service: Cardiovascular;  Laterality: N/A;   CORONARY STENT INTERVENTION N/A 10/09/2018   Procedure: CORONARY STENT INTERVENTION;  Surgeon: Jettie Booze, MD;  Location: [facility];  Service: Cardiovascular;  Laterality: N/A;   DILATATION & CURETTAGE/HYSTEROSCOPY WITH MYOSURE N/A 05/19/2016   Procedure: DILATATION & CURETTAGE/HYSTEROSCOPY WITH MYOSURE;  Surgeon: Thurnell Lose, MD;  Location: [facility];  Service: Gynecology;  Laterality: N/A;  Possible Myosure for polyps.   EYE SURGERY     LEFT HEART CATH AND CORONARY ANGIOGRAPHY N/A 10/09/2018   Procedure: LEFT HEART CATH AND CORONARY ANGIOGRAPHY;  Surgeon: Jettie Booze, MD;  Location: [facility];  Service: Cardiovascular;  Laterality: N/A;   MOUTH SURGERY     URETHRAL DILATION      OB History   No obstetric history on file.      Home Medications    Prior to Admission medications   Medication Sig Start Date End Date Taking? Authorizing Provider  amoxicillin-clavulanate (AUGMENTIN) 875-125 MG tablet Take 1 tablet by mouth 2 (two) times daily for 10 days. 09/28/21 10/08/21 Yes Jakita Dutkiewicz,  Legrand Como, FNP  benzonatate (TESSALON) 200 MG capsule Take 1 capsule (200 mg total) by mouth 3 (three) times daily as needed for up to 7 days for cough. 09/28/21 10/05/21 Yes Eliezer Lofts, FNP  fexofenadine [facility] ALLERGY) 180 MG tablet Take 1 tablet (180 mg total) by mouth daily for 15 days. 09/28/21 10/13/21 Yes Eliezer Lofts, FNP  acetaminophen (TYLENOL) 325 MG tablet Take 2 tablets (650 mg total) by mouth every 6 (six) hours as needed for mild  pain (or Fever >/= 101). 12/19/19   Eugenie Filler, MD  amLODipine (NORVASC) 2.5 MG tablet Take 1 tablet (2.5 mg total) by mouth daily. Patient must attend scheduled appointment for future refills. 06/11/21   Troy Sine, MD  B Complex-C (SUPER B COMPLEX PO) Take by mouth.    [provider]  BIOTIN PO Take 1 tablet by mouth daily.    [provider]  buPROPion (WELLBUTRIN XL) 150 MG 24 hr tablet Take 1 tablet by mouth daily. 01/31/21   [provider]  calcium carbonate (OSCAL) 1500 (600 Ca) MG TABS tablet Take by mouth daily with breakfast.    [provider]  Cholecalciferol (VITAMIN D3) 125 MCG (5000 UT) CAPS Take 5,000 Units by mouth daily.    [provider]  clopidogrel (PLAVIX) 75 MG tablet TAKE ONE TABLET BY MOUTH ONE TIME DAILY 04/13/21   Skeet Latch, MD  Coenzyme Q10 (COQ-10 PO) Take 1 capsule by mouth daily.     [provider]  COVID-19 mRNA bivalent vaccine, Pfizer, (PFIZER COVID-19 VAC BIVALENT) injection Inject into the muscle. 05/05/21   Carlyle Basques, MD  Cyanocobalamin (B-12 PO) Take 1 tablet by mouth daily.    [provider]  diclofenac Sodium (VOLTAREN) 1 % GEL Apply topically as needed.    [provider]  esomeprazole (NEXIUM 24HR CLEAR MINIS) 20 MG capsule Take 1 capsule (20 mg total) by mouth daily before breakfast. 12/19/19   Eugenie Filler, MD  Evolocumab (REPATHA SURECLICK) XX123456 MG/ML SOAJ Inject 1 Dose into the skin every 14 (fourteen) days. 11/05/20   Hilty, Nadean Corwin, MD  fluticasone (FLONASE) 50 MCG/ACT nasal spray Place 1-2 sprays into both nostrils daily.    [provider]  gabapentin (NEURONTIN) 100 MG capsule Take 3 capsules by mouth at bedtime. 01/30/21   [provider]  losartan (COZAAR) 50 MG tablet Take 1 tablet (50 mg total) by mouth daily. 04/14/21   Skeet Latch, MD  metoprolol tartrate (LOPRESSOR) 25 MG tablet TAKE ONE-HALF TABLET BY MOUTH TWICE A  DAY 01/15/21   Hilty, Nadean Corwin, MD  nitroGLYCERIN (NITROSTAT) 0.4 MG SL tablet Place 1 tablet (0.4 mg total) under the tongue as needed for chest pain. 07/22/21   Troy Sine, MD  Omega-3 Fatty Acids (OMEGA 3 PO) Take 1 capsule by mouth daily.    [provider]  Plant Sterols and Stanols (CHOLESTOFF PLUS PO) Take by mouth daily.    [provider]  POTASSIUM GLUCONATE PO Take by mouth daily.    [provider]  Probiotic Product (PROBIOTIC ADVANCED PO) Take by mouth daily.    [provider]  TURMERIC PO Take by mouth daily.    [provider]    Family History Family History  Problem Relation Age of Onset   Vascular Disease Mother    Dementia Mother    Dementia Father    Heart attack Maternal Grandmother    Heart attack Maternal Grandfather    Kidney  failure Paternal Grandmother    Heart attack Paternal Grandfather     Social History Social History   Tobacco Use   Smoking status: Never   Smokeless tobacco: Never  Vaping Use   Vaping Use: Never used  Substance Use Topics   Alcohol use: Yes    Alcohol/week: 2.0 standard drinks    Types: 2 Standard drinks or equivalent per week    Comment: rarely   Drug use: No     Allergies   Sulfa antibiotics, Ciprofloxacin, Depakote [divalproex sodium], Pine, Risperidone, Seroquel [quetiapine fumarate], Statins, Tramadol, Citalopram, Lamotrigine, Other, Topiramate, and Zetia [ezetimibe]   Review of Systems Review of Systems  Constitutional:  Positive for fatigue.  HENT:  Positive for congestion.   Respiratory:  Positive for cough.   All other systems reviewed and are negative.   Physical Exam Triage Vital Signs ED Triage Vitals  Enc Vitals Group     BP 09/28/21 1419 103/61     Pulse Rate 09/28/21 1419 79     Resp 09/28/21 1419 20     Temp 09/28/21 1419 98.4 F (36.9 C)     Temp Source 09/28/21 1419 Oral     SpO2 09/28/21 1419 96 %     Weight 09/28/21 1412 190 lb (86.2 kg)      Height 09/28/21 1412 5' (1.524 m)     Head Circumference --      Peak Flow --      Pain Score 09/28/21 1411 5     Pain Loc --      Pain Edu? --      Excl. in [place]? --    No data found.  Updated Vital Signs BP 103/61 (BP Location: Right Arm)    Pulse 79    Temp 98.4 F (36.9 C) (Oral)    Resp 20    Ht 5' (1.524 m)    Wt 190 lb (86.2 kg)    SpO2 96%    BMI 37.11 kg/m    Physical Exam Vitals and nursing note reviewed.  Constitutional:      General: She is not in acute distress.    Appearance: Normal appearance. She is obese. She is not ill-appearing.  HENT:     Head: Normocephalic and atraumatic.     Right Ear: Tympanic membrane and external ear normal.     Left Ear: Tympanic membrane and external ear normal.     Ears:     Comments: Moderate eustachian tube dysfunction noted bilaterally    Nose:     Comments: Turbinates are edematous/erythematous    Mouth/Throat:     Mouth: Mucous membranes are moist.     Pharynx: Oropharynx is clear.     Comments: Moderate amount of clear drainage of posterior oropharynx noted Eyes:     Extraocular Movements: Extraocular movements intact.     Conjunctiva/sclera: Conjunctivae normal.     Pupils: Pupils are equal, round, and reactive to light.  Cardiovascular:     Rate and Rhythm: Normal rate and regular rhythm.     Pulses: Normal pulses.     Heart sounds: Normal heart sounds.  Pulmonary:     Effort: Pulmonary effort is normal.     Breath sounds: Normal breath sounds.  Musculoskeletal:     Cervical back: Normal range of motion and neck supple.  Skin:    General: Skin is warm and dry.  Neurological:     General: No focal deficit present.     Mental Status: She is  alert and oriented to person, place, and time.     UC Treatments / Results  Labs (all labs ordered are listed, but only abnormal results are displayed) Labs Reviewed - No data to display  EKG   Radiology No results found.  Procedures Procedures (including  critical care time)  Medications Ordered in UC Medications - No data to display  Initial Impression / Assessment and Plan / UC Course  I have reviewed the triage vital signs and the nursing notes.  Pertinent labs & imaging results that were available during my care of the patient were reviewed by me and considered in my medical decision making (see chart for details).    MDM: 1.  Subacute maxillary sinusitis-Augmentin; 2.  Allergic rhinitis-Rx'd Allegra; 3.  Cough-Rx'd Tessalon Perles. Advised patient to take medication as directed with food to completion.  Advised patient to take Allegra with first dose of Augmentin for the next 5 of 10 days.  Advised may use Allegra as needed afterwards for concurrent postnasal drainage/drip.  Advised patient may use Tessalon Perles daily or as needed for cough. Encouraged patient to increase daily water intake while taking these medications. Work note provided per patient request prior to discharge.  Patient discharged home, hemodynamically stable. Final Clinical Impressions(s) / UC Diagnoses   Final diagnoses:  Subacute maxillary sinusitis  Allergic rhinitis, unspecified seasonality, unspecified trigger  Cough, unspecified type     Discharge Instructions      Advised patient to take medication as directed with food to completion.  Advised patient to take Allegra with first dose of Augmentin for the next 5 of 10 days.  Advised may use Allegra as needed afterwards for concurrent postnasal drainage/drip.  Advised patient may use Tessalon Perles daily or as needed for cough. Encouraged patient to increase daily water intake while taking these medications.     ED Prescriptions     Medication Sig Dispense Auth. Provider   amoxicillin-clavulanate (AUGMENTIN) 875-125 MG tablet Take 1 tablet by mouth 2 (two) times daily for 10 days. 20 tablet Eliezer Lofts, FNP   fexofenadine [facility] ALLERGY) 180 MG tablet Take 1 tablet (180 mg total) by mouth daily for  15 days. 15 tablet Eliezer Lofts, FNP   benzonatate (TESSALON) 200 MG capsule Take 1 capsule (200 mg total) by mouth 3 (three) times daily as needed for up to 7 days for cough. 40 capsule Eliezer Lofts, FNP      PDMP not reviewed this encounter.   Eliezer Lofts, [place] 09/28/21 1537

## 2021-09-29 DIAGNOSIS — M5030 Other cervical disc degeneration, unspecified cervical region: Secondary | ICD-10-CM | POA: Diagnosis not present

## 2021-09-29 DIAGNOSIS — M542 Cervicalgia: Secondary | ICD-10-CM | POA: Diagnosis not present

## 2021-09-29 DIAGNOSIS — M5412 Radiculopathy, cervical region: Secondary | ICD-10-CM | POA: Diagnosis not present

## 2021-10-04 DIAGNOSIS — M542 Cervicalgia: Secondary | ICD-10-CM | POA: Diagnosis not present

## 2021-10-05 ENCOUNTER — Encounter: Payer: Medicare Other | Admitting: Psychology

## 2021-10-12 ENCOUNTER — Telehealth: Payer: Self-pay | Admitting: Physician Assistant

## 2021-10-12 ENCOUNTER — Telehealth (HOSPITAL_BASED_OUTPATIENT_CLINIC_OR_DEPARTMENT_OTHER): Payer: Self-pay | Admitting: *Deleted

## 2021-10-12 DIAGNOSIS — M542 Cervicalgia: Secondary | ICD-10-CM | POA: Diagnosis not present

## 2021-10-12 NOTE — Telephone Encounter (Signed)
Patient wants to know if she needs to see sara prior to her neurocognitive test. She had to resc her testing.  ?

## 2021-10-12 NOTE — Telephone Encounter (Signed)
? ?  Pre-operative Risk Assessment  ?  ?Patient Name: Lindsay [name]  ?DOB: 08-09-1955 ?MRN: [id number]  ? ?  ? ?Request for Surgical Clearance   ? ?Procedure:   CERVICAL ESI INJECTION  ? ?Date of Surgery:  Clearance 10/22/21                              ?   ?Surgeon:  Venita Lick MD ?Surgeon's Group or Practice Name: [facility]  ?Phone number:  [phone] EXT [phone] ?Fax number:  [phone] ?  ?Type of Clearance Requested:   ?- Medical  ?- Pharmacy:  Hold Clopidogrel (Plavix) 7 DAYS  ?  ?Type of Anesthesia:  Not Indicated ?{ ? ? ? ? ?

## 2021-10-12 NOTE — Telephone Encounter (Signed)
Dr. Alaura Schippers [place] ?Pt has a hx of CAD with las PCI 09/2018 with DES x 2 to D1.  ? ?We are asked to hold plavix for ESI - ok to hold? ?

## 2021-10-14 ENCOUNTER — Telehealth: Payer: Self-pay | Admitting: *Deleted

## 2021-10-14 DIAGNOSIS — M5412 Radiculopathy, cervical region: Secondary | ICD-10-CM | POA: Insufficient documentation

## 2021-10-14 DIAGNOSIS — Z20822 Contact with and (suspected) exposure to covid-19: Secondary | ICD-10-CM | POA: Diagnosis not present

## 2021-10-14 HISTORY — DX: Radiculopathy, cervical region: M54.12

## 2021-10-14 NOTE — Telephone Encounter (Signed)
Pt has tele pre op appt tomorrow 10/15/21 @ 3 pm.  ?

## 2021-10-14 NOTE — Telephone Encounter (Signed)
MED REC AND CONSENT DONE.  ? ?  ?Patient Consent for Virtual Visit  ? ? ?   ? ?Lindsay [name] has provided verbal consent on 10/14/2021 for a virtual visit (video or telephone). ? ? ?CONSENT FOR VIRTUAL VISIT FOR:  Lindsay [name]  ?By participating in this virtual visit I agree to the following: ? ?I hereby voluntarily request, consent and authorize [facility] and its employed or contracted physicians, physician assistants, nurse practitioners or other licensed health care professionals (the Practitioner), to provide me with telemedicine health care services (the ?Services") as deemed necessary by the treating Practitioner. I acknowledge and consent to receive the Services by the Practitioner via telemedicine. I understand that the telemedicine visit will involve communicating with the Practitioner through live audiovisual communication technology and the disclosure of certain medical information by electronic transmission. I acknowledge that I have been given the opportunity to request an in-person assessment or other available alternative prior to the telemedicine visit and am voluntarily participating in the telemedicine visit. ? ?I understand that I have the right to withhold or withdraw my consent to the use of telemedicine in the course of my care at any time, without affecting my right to future care or treatment, and that the Practitioner or I may terminate the telemedicine visit at any time. I understand that I have the right to inspect all information obtained and/or recorded in the course of the telemedicine visit and may receive copies of available information for a reasonable fee.  I understand that some of the potential risks of receiving the Services via telemedicine include:  ?Delay or interruption in medical evaluation due to technological equipment failure or disruption; ?Information transmitted may not be sufficient (e.g. poor resolution of images) to allow for appropriate medical decision making by the  Practitioner; and/or  ?In rare instances, security protocols could fail, causing a breach of personal health information. ? ?Furthermore, I acknowledge that it is my responsibility to provide information about my medical history, conditions and care that is complete and accurate to the best of my ability. I acknowledge that Practitioner's advice, recommendations, and/or decision may be based on factors not within their control, such as incomplete or inaccurate data provided by me or distortions of diagnostic images or specimens that may result from electronic transmissions. I understand that the practice of medicine is not an exact science and that Practitioner makes no warranties or guarantees regarding treatment outcomes. I acknowledge that a copy of this consent can be made available to me via my patient portal ([facility]), or I can request a printed copy by calling the office of [facility].   ? ?I understand that my insurance will be billed for this visit.  ? ?I have read or had this consent read to me. ?I understand the contents of this consent, which adequately explains the benefits and risks of the Services being provided via telemedicine.  ?I have been provided ample opportunity to ask questions regarding this consent and the Services and have had my questions answered to my satisfaction. ?I give my informed consent for the services to be provided through the use of telemedicine in my medical care ? ? ? ?

## 2021-10-15 ENCOUNTER — Other Ambulatory Visit: Payer: Self-pay

## 2021-10-15 ENCOUNTER — Telehealth: Payer: Self-pay | Admitting: Internal Medicine

## 2021-10-15 ENCOUNTER — Ambulatory Visit (INDEPENDENT_AMBULATORY_CARE_PROVIDER_SITE_OTHER): Payer: Medicare Other | Admitting: General Practice

## 2021-10-15 DIAGNOSIS — Z91199 Patient's noncompliance with other medical treatment and regimen due to unspecified reason: Secondary | ICD-10-CM

## 2021-10-15 NOTE — Progress Notes (Signed)
Preoperative team, attempted to contact patient for preoperative phone visit/cardiac evaluation.  She was unable to be reached x3.  Please contact patient to reschedule her phone appointment.  Thank you for your help. ? ? ?Thomasene Ripple. Johathon Overturf NP-C ? ?  ?10/15/2021, 3:13 PM ?[facility] ?[address] ?Office [phone] Fax [phone] ? ?

## 2021-10-15 NOTE — Telephone Encounter (Signed)
Patient was returning a missed call for her clearance ?

## 2021-10-15 NOTE — Telephone Encounter (Signed)
Pt c/o medication issue: ? ?1. Name of Medication:  ?Evolocumab (REPATHA SURECLICK) [id number] MG/ML SOAJ ? ?2. How are you currently taking this medication (dosage and times per day)?  ? ?3. Are you having a reaction (difficulty breathing--STAT)?  ? ?4. What is your medication issue?  ? ?Patient states she would like to discuss a pending grant for [place].  ? ?

## 2021-10-15 NOTE — Telephone Encounter (Signed)
Patient states that she missed her virtual appointment for Pre Op clearance with Verdon Cummins today- rescheduled patient for tomorrow at 9:20 for pre op phone visit.  ? ?Patient also states that her [place] is now up to be renewed either via prior auth or grant. Patient wanted to know if Dr. Blanchie Dessert nurse could look into this. Advised I would forward message over to Herb Grays, RN. Patient verbalized understanding.  ? ? ?

## 2021-10-16 ENCOUNTER — Other Ambulatory Visit: Payer: Self-pay

## 2021-10-16 ENCOUNTER — Ambulatory Visit: Payer: Medicare Other | Admitting: Physician Assistant

## 2021-10-16 ENCOUNTER — Ambulatory Visit (INDEPENDENT_AMBULATORY_CARE_PROVIDER_SITE_OTHER): Payer: Medicare Other | Admitting: General Practice

## 2021-10-16 DIAGNOSIS — Z01810 Encounter for preprocedural cardiovascular examination: Secondary | ICD-10-CM | POA: Diagnosis not present

## 2021-10-16 NOTE — Telephone Encounter (Signed)
Spoke with patient of Dr. Rennis Golden ?Explained she will need to complete a fasting lipid panel, as last was 10/2020 ?She states she has tried various locations for labs and no one has been able to get blood ?She plans to come to [place] ? ?Advised re-auth will be done once labs have resulted ? ?She also needs renewal of [employer] grant. Advised will check on this for her ? ? ?

## 2021-10-16 NOTE — Progress Notes (Signed)
? ?Virtual Visit via Telephone Note  ? ?This visit type was conducted due to national recommendations for restrictions regarding the COVID-19 Pandemic (e.g. social distancing) in an effort to limit this patient's exposure and mitigate transmission in our community.  Due to her co-morbid illnesses, this patient is at least at moderate risk for complications without adequate follow up.  This format is felt to be most appropriate for this patient at this time.  The patient did not have access to video technology/had technical difficulties with video requiring transitioning to audio format only (telephone).  All issues noted in this document were discussed and addressed.  No physical exam could be performed with this format.  Please refer to the patient's chart for her  consent to telehealth for [facility]. ?Evaluation Performed:  Preoperative cardiovascular risk assessment ? ?This visit type was conducted due to national recommendations for restrictions regarding the COVID-19 Pandemic (e.g. social distancing).  This format is felt to be most appropriate for this patient at this time.  All issues noted in this document were discussed and addressed.  No physical exam was performed (except for noted visual exam findings with Video Visits).  Please refer to the patient's chart (MyChart message for video visits and phone note for telephone visits) for the patient's consent to telehealth for [facility]. ?_____________  ? ?Date:  10/16/2021  ? ?Patient ID:  Lindsay [name], DOB [birth year], MRN [id number] ?Patient Location:  ?Home ?Provider location:   ?Office ? ?Primary Care Provider:  Marda Stalker, PA-C ?Primary Cardiologist:  Skeet Latch, MD ? ?Chief Complaint  ?  ?66 y.o. y/o female with a h/o hypertension, memory loss, chronic atrial fibrillation, coronary artery disease, who is pending ESI, and presents today for telephonic preoperative cardiovascular risk assessment. ? ?Past Medical History  ?  ?Past Medical  History:  ?Diagnosis Date  ? Anginal pain ([place])   ? Atrial fibrillation ([place])   ? Bipolar disorder ([place])   ? Cataract   ? OU  ? Coronary artery disease involving native coronary artery with angina pectoris ([place]) 03/24/2015  ? Dysrhythmia   ? GERD (gastroesophageal reflux disease)   ? History of appendectomy 1963  ? Hypertension   ? Hypertensive retinopathy   ? OU  ? Myocardial infarction [facility])   ? per pt due to Vit D deficiency  ? Syncope 11/2019  ? Vitamin D deficiency   ? ?Past Surgical History:  ?Procedure Laterality Date  ? APPENDECTOMY    ? CARDIAC CATHETERIZATION N/A 03/25/2015  ? Procedure: Left Heart Cath and Coronary Angiography;  Surgeon: Leonie Man, MD;  Location: [place] CV LAB;  Service: Cardiovascular;  Laterality: N/A;  ? CORONARY STENT INTERVENTION N/A 10/09/2018  ? Procedure: CORONARY STENT INTERVENTION;  Surgeon: Jettie Booze, MD;  Location: [place] CV LAB;  Service: Cardiovascular;  Laterality: N/A;  ? DILATATION & CURETTAGE/HYSTEROSCOPY WITH MYOSURE N/A 05/19/2016  ? Procedure: [place];  Surgeon: Thurnell Lose, MD;  Location: [place] [facility];  Service: Gynecology;  Laterality: N/A;  Possible Myosure for polyps.  ? EYE SURGERY    ? LEFT HEART CATH AND CORONARY ANGIOGRAPHY N/A 10/09/2018  ? Procedure: LEFT HEART CATH AND CORONARY ANGIOGRAPHY;  Surgeon: Jettie Booze, MD;  Location: [place] CV LAB;  Service: Cardiovascular;  Laterality: N/A;  ? MOUTH SURGERY    ? URETHRAL DILATION    ? ? ?Allergies ? ?Allergies  ?Allergen Reactions  ? Sulfa Antibiotics Swelling and Rash  ? Ciprofloxacin Nausea And  Vomiting  ? Depakote [Divalproex Sodium] Other (See Comments)  ?  Reaction not recalled- "it was 20 years ago"  ? Pine Itching  ? Risperidone Other (See Comments)  ?  Unknown reaction  ? Seroquel [Quetiapine Fumarate] Other (See Comments)  ?  VIVID nightmares  ? Statins Other (See Comments)  ?  joint pain, weakness  ? Tramadol Other (See  Comments)  ?  Serotonin syndrome  ? Citalopram Rash and Other (See Comments)  ?  Insomnia and agitation also  ? Lamotrigine Itching, Rash and Other (See Comments)  ?  Stevens-Johnson syndrome and Brown urine, also  ? Other Other (See Comments) and Cough  ?  Cat dander  ? Topiramate Rash and Other (See Comments)  ?  Nightmares, brown urine also  ? Zetia [Ezetimibe]   ?  Joint pain, weakness   ? ? ?History of Present Illness  ?  ?Lindsay [name] is a 66 y.o. female who presents via audio/video conferencing for a telehealth visit today.  Pt was last seen in cardiology clinic on 04/06/21, by Dr. Oval Linsey.  At that time Lindsay [name] was doing well .  she is now pending cervical ESI.  Since his last visit, she remained stable from a cardiac standpoint. ? ?Today she denies chest pain, shortness of breath, lower extremity edema, fatigue, palpitations, melena, hematuria, hemoptysis, diaphoresis, weakness, presyncope, syncope, orthopnea, and PND. ? ? ?Home Medications  ?  ?Prior to Admission medications   ?Medication Sig Start Date End Date Taking? Authorizing Provider  ?acetaminophen (TYLENOL) 325 MG tablet Take 2 tablets (650 mg total) by mouth every 6 (six) hours as needed for mild pain (or Fever >/= 101). 12/19/19   Eugenie Filler, MD  ?amLODipine (NORVASC) 2.5 MG tablet Take 1 tablet (2.5 mg total) by mouth daily. Patient must attend scheduled appointment for future refills. ?Patient taking differently: Take 2.5 mg by mouth every evening. Patient must attend scheduled appointment for future refills. 06/11/21   Troy Sine, MD  ?B Complex-C (SUPER B COMPLEX PO) Take by mouth. ?Patient not taking: Reported on 10/14/2021    [provider]  ?BIOTIN PO Take 1 tablet by mouth daily. ?Patient not taking: Reported on 10/14/2021    [provider]  ?buPROPion (WELLBUTRIN XL) 150 MG 24 hr tablet Take 1 tablet by mouth daily. 01/31/21   [provider]  ?calcium carbonate (OSCAL) 1500 (600 Ca) MG TABS tablet  Take by mouth daily with breakfast. ?Patient not taking: Reported on 10/14/2021    [provider]  ?Cholecalciferol (VITAMIN D3) 125 MCG (5000 UT) CAPS Take 5,000 Units by mouth daily. ?Patient not taking: Reported on 10/14/2021    [provider]  ?clopidogrel (PLAVIX) 75 MG tablet TAKE ONE TABLET BY MOUTH ONE TIME DAILY 04/13/21   Skeet Latch, MD  ?Coenzyme Q10 (COQ-10 PO) Take 1 capsule by mouth daily.  ?Patient not taking: Reported on 10/14/2021    [provider]  ?COVID-19 mRNA bivalent vaccine, Pfizer, (PFIZER COVID-19 VAC BIVALENT) injection Inject into the muscle. 05/05/21   Carlyle Basques, MD  ?Cyanocobalamin (B-12 PO) Take 1 tablet by mouth daily. ?Patient not taking: Reported on 10/14/2021    [provider]  ?diclofenac Sodium (VOLTAREN) 1 % GEL Apply topically as needed.    [provider]  ?esomeprazole (NEXIUM 24HR CLEAR MINIS) 20 MG capsule Take 1 capsule (20 mg total) by mouth daily before breakfast. 12/19/19   Eugenie Filler, MD  ?Evolocumab (REPATHA SURECLICK) [id number] MG/ML Darden Palmer  Inject 1 Dose into the skin every 14 (fourteen) days. 11/05/20   Hilty, Nadean Corwin, MD  ?fexofenadine [facility] ALLERGY) 180 MG tablet Take 1 tablet (180 mg total) by mouth daily for 15 days. 09/28/21 10/14/21  Eliezer Lofts, FNP  ?fluticasone (FLONASE) 50 MCG/ACT nasal spray Place 1-2 sprays into both nostrils daily.    [provider]  ?gabapentin (NEURONTIN) 100 MG capsule Take 3 capsules by mouth at bedtime. 01/30/21   [provider]  ?losartan (COZAAR) 50 MG tablet Take 1 tablet (50 mg total) by mouth daily. 04/14/21   Skeet Latch, MD  ?metoprolol tartrate (LOPRESSOR) 25 MG tablet TAKE ONE-HALF TABLET BY MOUTH TWICE A DAY 01/15/21   Hilty, Nadean Corwin, MD  ?nitroGLYCERIN (NITROSTAT) 0.4 MG SL tablet Place 1 tablet (0.4 mg total) under the tongue as needed for chest pain. 07/22/21   Troy Sine, MD  ?Omega-3 Fatty Acids (OMEGA 3 PO) Take 1 capsule by  mouth daily. ?Patient not taking: Reported on 10/14/2021    [provider]  ?Plant Sterols and Stanols (CHOLESTOFF PLUS PO) Take by mouth daily. ?Patient not taking: Reported on 10/14/2021    Provider, His

## 2021-10-16 NOTE — Telephone Encounter (Signed)
Looks like the pt has been rescheduled for tele pre op appt today @ 9:20.  ?

## 2021-10-16 NOTE — Telephone Encounter (Signed)
Renewed healthwell foundation grant - hypercholesterolemia medicare access ?Left message for patient with this info ?MyChart message sent ? ?Pharmacy Card ?CARD NO. ?[id number] ?  ?CARD STATUS ?Active ?  ?BIN ?[id number] ?  ?PCN ?[id number] ?  ?PC GROUP ?[id number] ?  ?HELP DESK ?[phone] ?  ?PROVIDER ?PDMI ?  ?PROCESSOR ?PDMI ?

## 2021-10-20 DIAGNOSIS — Z20822 Contact with and (suspected) exposure to covid-19: Secondary | ICD-10-CM | POA: Diagnosis not present

## 2021-10-20 DIAGNOSIS — Z1152 Encounter for screening for COVID-19: Secondary | ICD-10-CM | POA: Diagnosis not present

## 2021-10-20 NOTE — Telephone Encounter (Signed)
Another clinical team member completed repatha PA in CMM portal. This was approved. Dates unknown  ?

## 2021-10-22 DIAGNOSIS — M5412 Radiculopathy, cervical region: Secondary | ICD-10-CM | POA: Diagnosis not present

## 2021-10-22 DIAGNOSIS — Z20822 Contact with and (suspected) exposure to covid-19: Secondary | ICD-10-CM | POA: Diagnosis not present

## 2021-10-28 DIAGNOSIS — Z20822 Contact with and (suspected) exposure to covid-19: Secondary | ICD-10-CM | POA: Diagnosis not present

## 2021-11-12 ENCOUNTER — Encounter: Payer: Self-pay | Admitting: Internal Medicine

## 2021-11-12 ENCOUNTER — Ambulatory Visit (INDEPENDENT_AMBULATORY_CARE_PROVIDER_SITE_OTHER): Payer: Medicare Other | Admitting: Internal Medicine

## 2021-11-12 VITALS — BP 151/92 | HR 72 | Ht 63.0 in | Wt 190.2 lb

## 2021-11-12 DIAGNOSIS — I25118 Atherosclerotic heart disease of native coronary artery with other forms of angina pectoris: Secondary | ICD-10-CM

## 2021-11-12 DIAGNOSIS — R079 Chest pain, unspecified: Secondary | ICD-10-CM

## 2021-11-12 DIAGNOSIS — I447 Left bundle-branch block, unspecified: Secondary | ICD-10-CM

## 2021-11-12 DIAGNOSIS — E785 Hyperlipidemia, unspecified: Secondary | ICD-10-CM

## 2021-11-12 NOTE — Patient Instructions (Addendum)
Medication Instructions:  ?RESUME repatha ? ?*If you need a refill on your cardiac medications before your next appointment, please call your pharmacy* ? ? ?Lab Work: ?FASTING lipid panel in about 2 months ? ?If you have labs (blood work) drawn today and your tests are completely normal, you will receive your results only by: ?MyChart Message (if you have MyChart) OR ?A paper copy in the mail ?If you have any lab test that is abnormal or we need to change your treatment, we will call you to review the results. ? ?Follow-Up: ?At [facility], you and your health needs are our priority.  As part of our continuing mission to provide you with exceptional heart care, we have created designated Provider Care Teams.  These Care Teams include your primary Cardiologist (physician) and Advanced Practice Providers (APPs -  Physician Assistants and Nurse Practitioners) who all work together to provide you with the care you need, when you need it. ? ?We recommend signing up for the patient portal called "MyChart".  Sign up information is provided on this After Visit Summary.  MyChart is used to connect with patients for Virtual Visits (Telemedicine).  Patients are able to view lab/test results, encounter notes, upcoming appointments, etc.  Non-urgent messages can be sent to your provider as well.   ?To learn more about what you can do with MyChart, go to [email or web address].   ? ?Your next appointment:   ? ?6 months with Dr. Rennis Golden  ? ? ?Healthwell Grant info: ? ?CARD NO. ?[id number] ?  ?CARD STATUS ?Active ?  ?BIN ?[id number] ?  ?PCN ?[id number] ?  ?PC GROUP ?[id number] ?  ?HELP DESK ?[phone] ?  ?PROVIDER ?PDMI ?  ?PROCESSOR ?PDMI ? ?

## 2021-11-12 NOTE — Progress Notes (Signed)
? ? ?LIPID CLINIC CONSULT NOTE ? ?Chief Complaint:  ?Follow-up dyslipidemia, chest pain ? ?Primary Care Physician: ?Jarrett Soho, PA-C ? ?Primary Cardiologist:  ?Chilton Si, MD ? ?HPI:  ?Lindsay [name] is a 66 y.o. female who is being seen today for the evaluation of dyslipidemia at the request of Jarrett Soho, [place].  This is a pleasant 66 year old female who presents for evaluation and management of dyslipidemia.  Past medical history significant for atrial fibrillation, coronary artery disease with prior intervention, myocardial infarction, hypertension and bipolar disorder.  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  She is followed closely by Dr. Duke Salvia and noted to have a dyslipidemia above target LDL less than 70 but has been intolerant to statins and ezetimibe causing significant myalgias.  Her last lipid profile is actually quite old from March 2020 showing total cholesterol 287, HDL 59, LDL of 204 and triglycerides 121.  She reports a varied diet not necessarily low in saturated fats.  There is a strong family history of heart disease and heart attacks mostly on the mother side of the family including several aunts and uncles which is highly suggestive of familial hyperlipidemia. ? ?11/12/2021 ? ?[name] returns today for follow-up.  She recently got back from a trip to [place].  She reports that she has not recently taken her Repatha for the past month and a half due to not being able to for the medication.  We were able to get her health well grant approved, however she apparently lost the envelope which contain the co-pay card.  We will provide her with that information today.  She has not had recent lipid testing however since she has been off the medication it would be of little value to do that testing today.  She also reported she had chest pain a few days ago, but has not had any since. ? ?PMHx:  ?Past Medical History:   ?Diagnosis Date  ? Anginal pain ([facility])   ? Atrial fibrillation ([facility])   ? Bipolar disorder ([facility])   ? Cataract   ? OU  ? Coronary artery disease involving native coronary artery with angina pectoris ([facility]) 03/24/2015  ? Dysrhythmia   ? GERD (gastroesophageal reflux disease)   ? History of appendectomy 1963  ? Hypertension   ? Hypertensive retinopathy   ? OU  ? Myocardial infarction [facility])   ? per pt due to Vit D deficiency  ? Syncope 11/2019  ? Vitamin D deficiency   ? ? ?Past Surgical History:  ?Procedure Laterality Date  ? APPENDECTOMY    ? CARDIAC CATHETERIZATION N/A 03/25/2015  ? Procedure: Left Heart Cath and Coronary Angiography;  Surgeon: Marykay Lex, MD;  Location: [facility] INVASIVE [facility] LAB;  Service: Cardiovascular;  Laterality: N/A;  ? CORONARY STENT INTERVENTION N/A 10/09/2018  ? Procedure: CORONARY STENT INTERVENTION;  Surgeon: Corky Crafts, MD;  Location: [facility] INVASIVE CV LAB;  Service: Cardiovascular;  Laterality: N/A;  ? DILATATION & CURETTAGE/HYSTEROSCOPY WITH MYOSURE N/A 05/19/2016  ? Procedure: DILATATION & CURETTAGE/HYSTEROSCOPY WITH MYOSURE;  Surgeon: Geryl Rankins, MD;  Location: [facility];  Service: Gynecology;  Laterality: N/A;  Possible Myosure for polyps.  ? EYE SURGERY    ? LEFT HEART CATH AND CORONARY ANGIOGRAPHY N/A 10/09/2018  ? Procedure: LEFT HEART CATH AND CORONARY ANGIOGRAPHY;  Surgeon: Corky Crafts, MD;  Location: [facility] INVASIVE CV LAB;  Service: Cardiovascular;  Laterality: N/A;  ? MOUTH SURGERY    ? URETHRAL  DILATION    ? ? ?FAMHx:  ?Family History  ?Problem Relation Age of Onset  ? Vascular Disease Mother   ? Dementia Mother   ? Dementia Father   ? Heart attack Maternal Grandmother   ? Heart attack Maternal Grandfather   ? Kidney failure Paternal Grandmother   ? Heart attack Paternal Grandfather   ? ? ?SOCHx:  ? reports that she has never smoked. She has never used smokeless tobacco. She reports current alcohol use of about 2.0 standard drinks per week. She reports that she does  not use drugs. ? ?ALLERGIES:  ?Allergies  ?Allergen Reactions  ? Sulfa Antibiotics Swelling and Rash  ? Ciprofloxacin Nausea And Vomiting  ? Depakote [Divalproex Sodium] Other (See Comments)  ?  Reaction not recalled- "it was 20 years ago"  ? Pine Itching  ? Risperidone Other (See Comments)  ?  Unknown reaction  ? Seroquel [Quetiapine Fumarate] Other (See Comments)  ?  VIVID nightmares  ? Statins Other (See Comments)  ?  joint pain, weakness  ? Tramadol Other (See Comments)  ?  Serotonin syndrome  ? Citalopram Rash and Other (See Comments)  ?  Insomnia and agitation also  ? Lamotrigine Itching, Rash and Other (See Comments)  ?  Stevens-Johnson syndrome and Brown urine, also  ? Other Other (See Comments) and Cough  ?  Cat dander  ? Topiramate Rash and Other (See Comments)  ?  Nightmares, brown urine also  ? Zetia [Ezetimibe]   ?  Joint pain, weakness   ? ? ?ROS: ?Pertinent items noted in HPI and remainder of comprehensive ROS otherwise negative. ? ?HOME MEDS: ?Current Outpatient Medications on File Prior to Visit  ?Medication Sig Dispense Refill  ? acetaminophen (TYLENOL) 325 MG tablet Take 2 tablets (650 mg total) by mouth every 6 (six) hours as needed for mild pain (or Fever >/= 101).    ? amLODipine (NORVASC) 2.5 MG tablet Take 1 tablet (2.5 mg total) by mouth daily. Patient must attend scheduled appointment for future refills. (Patient taking differently: Take 2.5 mg by mouth every evening. Patient must attend scheduled appointment for future refills.) 90 tablet 1  ? buPROPion (WELLBUTRIN XL) 150 MG 24 hr tablet Take 1 tablet by mouth daily.    ? clopidogrel (PLAVIX) 75 MG tablet TAKE ONE TABLET BY MOUTH ONE TIME DAILY 90 tablet 3  ? COVID-19 mRNA bivalent vaccine, Pfizer, (PFIZER COVID-19 VAC BIVALENT) injection Inject into the muscle. 0.3 mL 0  ? diclofenac Sodium (VOLTAREN) 1 % GEL Apply topically as needed.    ? esomeprazole (NEXIUM 24HR CLEAR MINIS) 20 MG capsule Take 1 capsule (20 mg total) by mouth daily  before breakfast. 30 capsule 1  ? fexofenadine (ALLEGRA ALLERGY) 180 MG tablet Take 1 tablet (180 mg total) by mouth daily for 15 days. 15 tablet 0  ? fluticasone (FLONASE) 50 MCG/ACT nasal spray Place 1-2 sprays into both nostrils daily.    ? gabapentin (NEURONTIN) 100 MG capsule Take 3 capsules by mouth at bedtime.    ? losartan (COZAAR) 50 MG tablet Take 1 tablet (50 mg total) by mouth daily. 90 tablet 2  ? metoprolol tartrate (LOPRESSOR) 25 MG tablet TAKE ONE-HALF TABLET BY MOUTH TWICE A DAY 90 tablet 3  ? nitroGLYCERIN (NITROSTAT) 0.4 MG SL tablet Place 1 tablet (0.4 mg total) under the tongue as needed for chest pain. 25 tablet PRN  ? Plant Sterols and Stanols (CHOLESTOFF PLUS PO) Take by mouth daily.    ? POTASSIUM  GLUCONATE PO Take by mouth daily.    ? B Complex-C (SUPER B COMPLEX PO) Take by mouth. (Patient not taking: Reported on 11/12/2021)    ? BIOTIN PO Take 1 tablet by mouth daily. (Patient not taking: Reported on 11/12/2021)    ? calcium carbonate (OSCAL) 1500 (600 Ca) MG TABS tablet Take by mouth daily with breakfast. (Patient not taking: Reported on 11/12/2021)    ? Cholecalciferol (VITAMIN D3) 125 MCG (5000 UT) CAPS Take 5,000 Units by mouth daily. (Patient not taking: Reported on 11/12/2021)    ? Coenzyme Q10 (COQ-10 PO) Take 1 capsule by mouth daily.  (Patient not taking: Reported on 11/12/2021)    ? Cyanocobalamin (B-12 PO) Take 1 tablet by mouth daily. (Patient not taking: Reported on 11/12/2021)    ? Evolocumab (REPATHA SURECLICK) 140 MG/ML SOAJ Inject 1 Dose into the skin every 14 (fourteen) days. (Patient not taking: Reported on 11/12/2021) 2 mL 11  ? Omega-3 Fatty Acids (OMEGA 3 PO) Take 1 capsule by mouth daily. (Patient not taking: Reported on 11/12/2021)    ? Probiotic Product (PROBIOTIC ADVANCED PO) Take by mouth daily. (Patient not taking: Reported on 11/12/2021)    ? TURMERIC PO Take by mouth daily. (Patient not taking: Reported on 11/12/2021)    ? ?No current facility-administered medications  on file prior to visit.  ? ? ?LABS/IMAGING: ?No results found for this or any previous visit (from the past 48 hour(s)). ?No results found. ? ?LIPID PANEL: ?   ?Component Value Date/Time  ? CHOL 285 (H) 04/25/202

## 2021-11-13 ENCOUNTER — Other Ambulatory Visit: Payer: Self-pay | Admitting: Internal Medicine

## 2021-11-13 ENCOUNTER — Telehealth: Payer: Self-pay | Admitting: Internal Medicine

## 2021-11-13 NOTE — Telephone Encounter (Signed)
?*  STAT* If patient is at the pharmacy, call can be transferred to refill team. ? ? ?1. Which medications need to be refilled? (please list name of each medication and dose if known)  ?Evolocumab (REPATHA SURECLICK) 140 MG/ML SOAJ ? ?2. Which pharmacy/location (including street and city if local pharmacy) is medication to be sent to? ?[facility] [address] - [place], [place] - [address] ? ?3. Do they need a 30 day or 90 day supply?  ? 90 day supply ? ?Patient states she is completely out of medication. ? ?

## 2021-11-16 DIAGNOSIS — Z20822 Contact with and (suspected) exposure to covid-19: Secondary | ICD-10-CM | POA: Diagnosis not present

## 2021-11-16 MED ORDER — REPATHA SURECLICK 140 MG/ML ~~LOC~~ SOAJ: 140.0000 mg | SUBCUTANEOUS | 3 refills | Status: DC

## 2021-11-16 NOTE — Telephone Encounter (Signed)
Repatha sent for 90 day supply to [facility] [place] as requested ?

## 2021-11-17 ENCOUNTER — Ambulatory Visit (INDEPENDENT_AMBULATORY_CARE_PROVIDER_SITE_OTHER): Payer: Medicare Other | Admitting: Family

## 2021-11-17 ENCOUNTER — Encounter (HOSPITAL_BASED_OUTPATIENT_CLINIC_OR_DEPARTMENT_OTHER): Payer: Self-pay | Admitting: Family

## 2021-11-17 VITALS — BP 90/60 | HR 77 | Ht 63.0 in | Wt 188.0 lb

## 2021-11-17 DIAGNOSIS — I25118 Atherosclerotic heart disease of native coronary artery with other forms of angina pectoris: Secondary | ICD-10-CM | POA: Diagnosis not present

## 2021-11-17 DIAGNOSIS — E785 Hyperlipidemia, unspecified: Secondary | ICD-10-CM

## 2021-11-17 DIAGNOSIS — R42 Dizziness and giddiness: Secondary | ICD-10-CM | POA: Diagnosis not present

## 2021-11-17 DIAGNOSIS — I447 Left bundle-branch block, unspecified: Secondary | ICD-10-CM

## 2021-11-17 DIAGNOSIS — R2681 Unsteadiness on feet: Secondary | ICD-10-CM

## 2021-11-17 DIAGNOSIS — I10 Essential (primary) hypertension: Secondary | ICD-10-CM | POA: Diagnosis not present

## 2021-11-17 MED ORDER — AMLODIPINE BESYLATE 2.5 MG PO TABS: 2.5000 mg | ORAL_TABLET | Freq: Every day | ORAL | 3 refills | Status: DC

## 2021-11-17 MED ORDER — METOPROLOL TARTRATE 25 MG PO TABS: ORAL_TABLET | ORAL | 3 refills | Status: DC

## 2021-11-17 MED ORDER — NITROGLYCERIN 0.4 MG SL SUBL: 0.4000 mg | SUBLINGUAL_TABLET | SUBLINGUAL | 5 refills | Status: AC | PRN

## 2021-11-17 MED ORDER — CLOPIDOGREL BISULFATE 75 MG PO TABS: 75.0000 mg | ORAL_TABLET | Freq: Every day | ORAL | 3 refills | Status: DC

## 2021-11-17 MED ORDER — LOSARTAN POTASSIUM 50 MG PO TABS: 50.0000 mg | ORAL_TABLET | Freq: Every day | ORAL | 3 refills | Status: DC

## 2021-11-17 NOTE — Progress Notes (Signed)
? ?Office Visit  ?  ?Patient Name: Lindsay [name] ?Date of Encounter: 11/17/2021 ? ?PCP:  Marda Stalker, PA-C ?  ?[place]  ?Cardiologist:  Skeet Latch, MD  ?Advanced Practice Provider:  No care team member to display ?Electrophysiologist:  None  ?   ? ?Chief Complaint  ?  ?Lindsay [name] is a 66 y.o. female with a hx of hypertension, coronary artery disease s/p MI in 2010 and PCI 2020, paroxysmal atrial fibrillation (not on anticoagulation due to hx of GI bleed), depression presents today for follow-up of coronary artery disease ? ?Past Medical History  ?  ?Past Medical History:  ?Diagnosis Date  ? Anginal pain ([place])   ? Atrial fibrillation ([place])   ? Bipolar disorder ([place])   ? Cataract   ? OU  ? Coronary artery disease involving native coronary artery with angina pectoris ([place]) 03/24/2015  ? Dysrhythmia   ? GERD (gastroesophageal reflux disease)   ? History of appendectomy 1963  ? Hypertension   ? Hypertensive retinopathy   ? OU  ? Myocardial infarction [facility])   ? per pt due to Vit D deficiency  ? Syncope 11/2019  ? Vitamin D deficiency   ? ?Past Surgical History:  ?Procedure Laterality Date  ? APPENDECTOMY    ? CARDIAC CATHETERIZATION N/A 03/25/2015  ? Procedure: Left Heart Cath and Coronary Angiography;  Surgeon: Leonie Man, MD;  Location: [place] [facility];  Service: Cardiovascular;  Laterality: N/A;  ? CORONARY STENT INTERVENTION N/A 10/09/2018  ? Procedure: CORONARY STENT INTERVENTION;  Surgeon: Jettie Booze, MD;  Location: [place] [facility];  Service: Cardiovascular;  Laterality: N/A;  ? DILATATION & CURETTAGE/HYSTEROSCOPY WITH MYOSURE N/A 05/19/2016  ? Procedure: [place];  Surgeon: Thurnell Lose, MD;  Location: [place] [facility];  Service: Gynecology;  Laterality: N/A;  Possible Myosure for polyps.  ? EYE SURGERY    ? LEFT HEART CATH AND CORONARY ANGIOGRAPHY N/A 10/09/2018  ? Procedure: LEFT HEART CATH AND CORONARY ANGIOGRAPHY;  Surgeon:  Jettie Booze, MD;  Location: [place] [facility];  Service: Cardiovascular;  Laterality: N/A;  ? MOUTH SURGERY    ? URETHRAL DILATION    ? ? ?Allergies ? ?Allergies  ?Allergen Reactions  ? Sulfa Antibiotics Swelling and Rash  ? Ciprofloxacin Nausea And Vomiting  ? Depakote [Divalproex Sodium] Other (See Comments)  ?  Reaction not recalled- "it was 20 years ago"  ? Pine Itching  ? Risperidone Other (See Comments)  ?  Unknown reaction  ? Seroquel [Quetiapine Fumarate] Other (See Comments)  ?  VIVID nightmares  ? Statins Other (See Comments)  ?  joint pain, weakness  ? Tramadol Other (See Comments)  ?  Serotonin syndrome  ? Citalopram Rash and Other (See Comments)  ?  Insomnia and agitation also  ? Lamotrigine Itching, Rash and Other (See Comments)  ?  Stevens-Johnson syndrome and Brown urine, also  ? Other Other (See Comments) and Cough  ?  Cat dander  ? Topiramate Rash and Other (See Comments)  ?  Nightmares, brown urine also  ? Zetia [Ezetimibe]   ?  Joint pain, weakness   ? ? ?History of Present Illness  ?  ?Lindsay [name] is a 66 y.o. female with a hx of hypertension, coronary artery disease s/p MI in 2010 and PCI 2020, paroxysmal atrial fibrillation (not on anticoagulation due to hx of GI bleed), depression  last seen 11/12/21 by Dr. Debara Pickett. ? ?She was evaluated in clinic  August 2016 with recurrent chest pain.  She was started on metoprolol and referred for cardiac catheterization.  To note cardiac catheterization August 2016 revealing 50% RCA lesion but otherwise minimal CAD.  Imdur was discontinued due to headache.  She had repeat angina and cath March 2020 found of 75% and 90% D1 lesions with successful PCI.  She has had infrequent PAC/PVC which are overall not bothersome.  She has had lower extremity edema presumed due to venous insufficiency. ? ?When last seen 04/06/21 she endorsed "a little bit "of atrial fibrillation and worsening memory loss.  Her mother had vascular dementia. She was referred to  neurology.  ? ?She was seen 11/12/2021 by Dr. Debara Pickett.  Recently had returned from a trip to [place].  She had not taken Repatha for the past month and a half due to cost.  She had previously been approved for helpful grant though apparently lost the old lobe with a co-pay card.  It was provided for her again.  She was recommended to resume the medication and repeat lipids in 2 to 3 months.  EKG performed due to her report of chest pain a few days prior which revealed sinus rhythm with a left bundle branch block which was unchanged from September 2022. ? ?Presents today for follow up. Tells me that her episode of chest pain occurred in her left chest as well as around her "wingbone" in her back. It occurred while laying in her bed. She could not find her nitroglycerin at the time. Has had some recent social stressors which she thinks may be contributory. Her previous anginal equivalent was a much different sharp, hard pain. BP has been 120s/70s. She is working part time at [employer] but only getting minimal hours. Tells me she has poor balance but this is not new. No near syncope nor dizziness. She is interested in gait training with physical therapy.  ? ?EKGs/Labs/Other Studies Reviewed:  ? ?The following studies were reviewed today: ? ?E[place] 12/18/2019: ? 1. Left ventricular ejection fraction, by estimation, is 50%. The left  ?ventricle has mildly decreased function. The left ventricle demonstrates  ?regional wall motion abnormalities, septal-lateral dyssynchrony consistent  ?with LBBB. Left ventricular  ?diastolic parameters are consistent with Grade I diastolic dysfunction  ?(impaired relaxation).  ? 2. Right ventricular systolic function is normal. The right ventricular  ?size is normal. Tricuspid regurgitation signal is inadequate for assessing  ?PA pressure.  ? 3. The mitral valve is normal in structure. No evidence of mitral valve  ?regurgitation. No evidence of mitral stenosis.  ? 4. The aortic valve is  tricuspid. Aortic valve regurgitation is not  ?visualized. Mild aortic valve sclerosis is present, with no evidence of  ?aortic valve stenosis.  ? 5. The inferior vena cava is normal in size with <50% respiratory  ?variability, suggesting right atrial pressure of 8 mmHg.  ?  ?[place] 10/09/18: ?Ost 2nd Diag to 2nd Diag lesion is 30% stenosed. ?Prox RCA lesion is 20% stenosed. ?Dist RCA lesion is 50% stenosed. ?Ost 1st Diag lesion is 90% stenosed. ?Prox Cx lesion is 10% stenosed. ?1st Diag lesion is 75% stenosed. ?A drug-eluting stent was successfully placed using a STENT SYNERGY DES 2.25X16. A drug-eluting stent was successfully placed using a STENT SYNERGY DES 2.25X8 for distal edge dissection. ?Post intervention, there is a 0% residual stenosis. ?The left ventricular systolic function is normal. ?LV end diastolic pressure is normal. ?The left ventricular ejection fraction is 55-65% by visual estimate. ?There is no aortic valve  stenosis. ?  ?DAPT for 6 months along with aggressive secondary prevention.   ?  ?Cardiac catheterization 03/25/15: ?Prox RCA lesion, 20% stenosed. ?Dist RCA lesion, 50% stenosed. ?Ost 2nd Diag to 2nd Diag lesion, 30% stenosed. ?The left ventricular systolic function is normal. ?  ?TTE 12/16/08: LVEF 55-60%.  Mild LVH.  RV and RA  mildly dilated. ? ?EKG:  No EKG today. ? ?Recent Labs: ?No results found for requested labs within last 8760 hours.  ?Recent Lipid Panel ?   ?Component Value Date/Time  ? CHOL 285 (H) 11/17/2020 1416  ? TRIG 103 11/17/2020 1416  ? HDL 57 11/17/2020 1416  ? CHOLHDL 5.0 (H) 11/17/2020 1416  ? CHOLHDL 4.3 04/02/2016 1554  ? VLDL 44 (H) 04/02/2016 1554  ? LDLCALC 210 (H) 11/17/2020 1416  ? ? ?Risk Assessment/Calculations:  ? ?CHA2DS2-VASc Score = 3  ? This indicates a 3.2% annual risk of stroke. ?The patient's score is based upon: ?CHF History: 0 ?HTN History: 1 ?Diabetes History: 0 ?Stroke History: 0 ?Vascular Disease History: 0 ?Age Score: 1 ?Gender Score: 1 ?  ? ?Home  Medications  ? ?Current Meds  ?Medication Sig  ? acetaminophen (TYLENOL) 325 MG tablet Take 2 tablets (650 mg total) by mouth every 6 (six) hours as needed for mild pain (or Fever >/= 101).  ? amLODipine (NORVASC) 2.5 MG tab

## 2021-11-17 NOTE — Patient Instructions (Addendum)
Medication Instructions:  ?Continue your current medications.  ? ?*If you need a refill on your cardiac medications before your next appointment, please call your pharmacy* ? ?Lab Work: ?None ordered today.  ? ?Testing/Procedures: ?None ordered today.  ? ?Follow-Up: ?At [facility], you and your health needs are our priority.  As part of our continuing mission to provide you with exceptional heart care, we have created designated Provider Care Teams.  These Care Teams include your primary Cardiologist (physician) and Advanced Practice Providers (APPs -  Physician Assistants and Nurse Practitioners) who all work together to provide you with the care you need, when you need it. ? ?We recommend signing up for the patient portal called "MyChart".  Sign up information is provided on this After Visit Summary.  MyChart is used to connect with patients for Virtual Visits (Telemedicine).  Patients are able to view lab/test results, encounter notes, upcoming appointments, etc.  Non-urgent messages can be sent to your provider as well.   ?To learn more about what you can do with MyChart, go to [email or web address].   ? ?Your next appointment:   ?6 month(s) ? ?The format for your next appointment:   ?In Person or Virtual Visit ? ?Provider:   ?Chilton Si, MD  ? ? ?Other Instructions ? ?Heart Healthy Diet Recommendations: ?A low-salt diet is recommended. Meats should be grilled, baked, or boiled. Avoid fried foods. Focus on lean protein sources like fish or chicken with vegetables and fruits. The American Heart Association is a Chief Technology Officer!  American Heart Association Diet and Lifeystyle Recommendations   ? ?Exercise recommendations: ?The American Heart Association recommends 150 minutes of moderate intensity exercise weekly. ?Try 30 minutes of moderate intensity exercise 4-5 times per week. ?This could include walking, jogging, or swimming. ? ? ?Important Information About Sugar ? ? ? ? ?  ?

## 2021-11-24 DIAGNOSIS — F319 Bipolar disorder, unspecified: Secondary | ICD-10-CM | POA: Insufficient documentation

## 2021-11-25 ENCOUNTER — Ambulatory Visit (INDEPENDENT_AMBULATORY_CARE_PROVIDER_SITE_OTHER): Payer: Self-pay | Admitting: Medical-Surgical

## 2021-11-25 DIAGNOSIS — Z91199 Patient's noncompliance with other medical treatment and regimen due to unspecified reason: Secondary | ICD-10-CM

## 2021-11-25 NOTE — Progress Notes (Signed)
   Complete physical exam  Patient: Lindsay [name]   DOB: 05/15/2001   66 y.o. Female  MRN: [id number]  Subjective:    No chief complaint on file.   Lindsay [name] is a 66 y.o. female who presents today for a complete physical exam. She reports consuming a {diet types:17450} diet. {types:19826} She generally feels {DESC; WELL/FAIRLY WELL/POORLY:18703}. She reports sleeping {DESC; WELL/FAIRLY WELL/POORLY:18703}. She {does/does not:200015} have additional problems to discuss today.    Most recent fall risk assessment:    01/20/2022   10:42 AM  Fall Risk   Falls in the past year? 0  Number falls in past yr: 0  Injury with Fall? 0  Risk for fall due to : No Fall Risks  Follow up Falls evaluation completed     Most recent depression screenings:    01/20/2022   10:42 AM 12/11/2020   10:46 AM  PHQ 2/9 Scores  PHQ - 2 Score 0 0  PHQ- 9 Score 5     {VISON DENTAL STD PSA (Optional):27386}  {History (Optional):23778}  Patient Care Team: Leanda Padmore, NP as PCP - General (Nurse Practitioner)   Outpatient Medications Prior to Visit  Medication Sig   fluticasone (FLONASE) 50 MCG/ACT nasal spray Place 2 sprays into both nostrils in the morning and at bedtime. After 7 days, reduce to once daily.   norgestimate-ethinyl estradiol (SPRINTEC 28) 0.25-35 MG-MCG tablet Take 1 tablet by mouth daily.   Nystatin POWD Apply liberally to affected area 2 times per day   spironolactone (ALDACTONE) 100 MG tablet Take 1 tablet (100 mg total) by mouth daily.   No facility-administered medications prior to visit.    ROS        Objective:     There were no vitals taken for this visit. {Vitals History (Optional):23777}  Physical Exam   No results found for any visits on 02/25/22. {Show previous labs (optional):23779}    Assessment & Plan:    Routine Health Maintenance and Physical Exam  Immunization History  Administered Date(s) Administered   DTaP 07/29/1999, 09/24/1999,  12/03/1999, 08/18/2000, 03/03/2004   Hepatitis A 12/29/2007, 01/03/2009   Hepatitis B 05/16/1999, 06/23/1999, 12/03/1999   HiB (PRP-OMP) 07/29/1999, 09/24/1999, 12/03/1999, 08/18/2000   IPV 07/29/1999, 09/24/1999, 05/23/2000, 03/03/2004   Influenza,inj,Quad PF,6+ Mos 04/05/2014   Influenza-Unspecified 07/05/2012   MMR 05/23/2001, 03/03/2004   Meningococcal Polysaccharide 01/03/2012   Pneumococcal Conjugate-13 08/18/2000   Pneumococcal-Unspecified 12/03/1999, 02/16/2000   Tdap 01/03/2012   Varicella 05/23/2000, 12/29/2007    Health Maintenance  Topic Date Due   HIV Screening  Never done   Hepatitis C Screening  Never done   INFLUENZA VACCINE  02/23/2022   PAP-Cervical Cytology Screening  02/25/2022 (Originally 05/14/2020)   PAP SMEAR-Modifier  02/25/2022 (Originally 05/14/2020)   TETANUS/TDAP  02/25/2022 (Originally 01/02/2022)   HPV VACCINES  Discontinued   COVID-19 Vaccine  Discontinued    Discussed health benefits of physical activity, and encouraged her to engage in regular exercise appropriate for her age and condition.  Problem List Items Addressed This Visit   None Visit Diagnoses     Annual physical exam    -  Primary   Cervical cancer screening       Need for Tdap vaccination          No follow-ups on file.     Landis Dowdy, NP   

## 2021-12-08 ENCOUNTER — Other Ambulatory Visit: Payer: Self-pay | Admitting: Cardiovascular Disease

## 2021-12-08 DIAGNOSIS — I10 Essential (primary) hypertension: Secondary | ICD-10-CM

## 2021-12-22 DIAGNOSIS — I10 Essential (primary) hypertension: Secondary | ICD-10-CM | POA: Diagnosis not present

## 2021-12-22 DIAGNOSIS — R457 State of emotional shock and stress, unspecified: Secondary | ICD-10-CM | POA: Diagnosis not present

## 2021-12-22 DIAGNOSIS — F32A Depression, unspecified: Secondary | ICD-10-CM | POA: Diagnosis not present

## 2022-01-07 ENCOUNTER — Other Ambulatory Visit: Payer: Self-pay | Admitting: Internal Medicine

## 2022-01-07 ENCOUNTER — Other Ambulatory Visit: Payer: Self-pay | Admitting: Cardiovascular Disease

## 2022-01-07 DIAGNOSIS — I10 Essential (primary) hypertension: Secondary | ICD-10-CM

## 2022-01-07 NOTE — Telephone Encounter (Signed)
Rx request sent to pharmacy.  

## 2022-01-19 DIAGNOSIS — I10 Essential (primary) hypertension: Secondary | ICD-10-CM | POA: Diagnosis not present

## 2022-01-19 DIAGNOSIS — R45851 Suicidal ideations: Secondary | ICD-10-CM | POA: Diagnosis not present

## 2022-02-26 ENCOUNTER — Telehealth: Payer: Self-pay | Admitting: Pharmacist

## 2022-02-26 NOTE — Telephone Encounter (Addendum)
Received key for PA renewal for Repatha Dr. Rennis Golden pt  KEY Lindsay [name]

## 2022-02-26 NOTE — Telephone Encounter (Signed)
Message sent to Primary RN.

## 2022-03-01 DIAGNOSIS — Z20828 Contact with and (suspected) exposure to other viral communicable diseases: Secondary | ICD-10-CM | POA: Diagnosis not present

## 2022-03-01 NOTE — Telephone Encounter (Signed)
PA attempted in [facility] Message received:  Authorization already on file for this request. Authorization starting on 07/26/2020 and ending on 07/25/2022

## 2022-03-08 ENCOUNTER — Other Ambulatory Visit: Payer: Self-pay

## 2022-03-08 DIAGNOSIS — I10 Essential (primary) hypertension: Secondary | ICD-10-CM

## 2022-03-08 MED ORDER — AMLODIPINE BESYLATE 2.5 MG PO TABS: 2.5000 mg | ORAL_TABLET | Freq: Every day | ORAL | 1 refills | Status: DC

## 2022-03-17 ENCOUNTER — Encounter: Payer: Self-pay | Admitting: Emergency Medicine

## 2022-03-17 ENCOUNTER — Ambulatory Visit
Admission: EM | Admit: 2022-03-17 | Discharge: 2022-03-17 | Disposition: A | Discharge status: Home / Self Care | Payer: Medicare Other | Attending: Family Medicine | Admitting: Family Medicine

## 2022-03-17 DIAGNOSIS — J069 Acute upper respiratory infection, unspecified: Secondary | ICD-10-CM | POA: Diagnosis not present

## 2022-03-17 DIAGNOSIS — R062 Wheezing: Secondary | ICD-10-CM | POA: Diagnosis not present

## 2022-03-17 DIAGNOSIS — Z8659 Personal history of other mental and behavioral disorders: Secondary | ICD-10-CM

## 2022-03-17 DIAGNOSIS — F1020 Alcohol dependence, uncomplicated: Secondary | ICD-10-CM | POA: Insufficient documentation

## 2022-03-17 MED ORDER — PREDNISONE 20 MG PO TABS: 40.0000 mg | ORAL_TABLET | Freq: Every day | ORAL | 0 refills | Status: DC

## 2022-03-17 MED ORDER — AZITHROMYCIN 250 MG PO TABS: ORAL_TABLET | ORAL | 0 refills | Status: DC

## 2022-03-17 NOTE — ED Provider Notes (Signed)
[name]    CSN: [id number] Arrival date & time: 03/17/22  1458      History   Chief Complaint Chief Complaint  Patient presents with   Sore Throat    HPI Lindsay [name] is a 66 y.o. female.   HPI  Patient has a sore throat, congestion, fatigue, and "mental fog" for the last week.  She has been trying over-the-counter remedies and products without help.  She tried going to work today but was sent for medical evaluation because they could tell she was not well.  She states she does have some shortness of breath when she tries to lie flat at night.  Does not have an underlying history of lung disease, asthma, emphysema.  Never cigarette smoker. Patient states that she had severe fatigue after an Epstein-Barr virus infection.  She feels almost as tired now. Patient also has a lot of trauma in her past, increased rest, bipolar illness and depression.  She states she is feeling more depressed since she has been sick. Past medical history reviewed Medication list reviewed Past Medical History:  Diagnosis Date   Anginal pain (HCC)    Atrial fibrillation (HCC)    Bipolar disorder (HCC)    Cataract    OU   Coronary artery disease involving native coronary artery with angina pectoris (HCC) 03/24/2015   Dysrhythmia    GERD (gastroesophageal reflux disease)    History of appendectomy 1963   Hypertension    Hypertensive retinopathy    OU   Myocardial infarction (HCC)    per pt due to Vit D deficiency   Syncope 11/2019   Vitamin D deficiency     Patient Active Problem List   Diagnosis Date Noted   Cervical radiculopathy 10/14/2021   Bipolar 1 disorder (HCC) 01/28/2021   Pain in joint of right hip 07/03/2020   Rib pain 07/03/2020   Recurrent syncope    Orthostasis    Coronary artery disease involving native coronary artery of native heart 12/18/2019   Syncope 12/18/2019   Lactic acidosis 12/18/2019   Hematemesis 12/18/2019   Pain in right knee 12/14/2018   PAF  (paroxysmal atrial fibrillation) (HCC) 10/09/2018   Dorsalgia 09/22/2018   Dysphagia 09/22/2018   GERD without esophagitis 09/22/2018   Irritable bowel syndrome without diarrhea 09/22/2018   Major depressive disorder, single episode 09/22/2018   Menopausal and perimenopausal disorder 09/22/2018   Migraine without status migrainosus, not intractable 09/22/2018   Obesity 09/22/2018   Osteoarthritis of knee 09/22/2018   Polypharmacy 09/22/2018   Sciatica of right side 09/22/2018   Vitamin D deficiency 09/22/2018   Mixed hyperlipidemia 05/07/2016   Alcohol intoxication (HCC) 09/08/2015   Chronic atrial fibrillation (HCC) 09/08/2015   Mood disorder (HCC) 09/08/2015   Coronary artery disease involving native coronary artery with angina pectoris (HCC) 03/24/2015   Essential hypertension 03/03/2015   Chest pain 02/11/2015   Non-ST elevation MI (NSTEMI) - h/o 07/26/2008    Past Surgical History:  Procedure Laterality Date   APPENDECTOMY     CARDIAC CATHETERIZATION N/A 03/25/2015   Procedure: Left Heart Cath and Coronary Angiography;  Surgeon: Marykay Lex, MD;  Location: [facility] INVASIVE CV LAB;  Service: Cardiovascular;  Laterality: N/A;   CORONARY STENT INTERVENTION N/A 10/09/2018   Procedure: CORONARY STENT INTERVENTION;  Surgeon: Corky Crafts, MD;  Location: [facility] INVASIVE CV LAB;  Service: Cardiovascular;  Laterality: N/A;   DILATATION & CURETTAGE/HYSTEROSCOPY WITH MYOSURE N/A 05/19/2016   Procedure: DILATATION & CURETTAGE/HYSTEROSCOPY WITH MYOSURE;  Surgeon: Geryl Rankins, MD;  Location: [facility];  Service: Gynecology;  Laterality: N/A;  Possible Myosure for polyps.   EYE SURGERY     LEFT HEART CATH AND CORONARY ANGIOGRAPHY N/A 10/09/2018   Procedure: LEFT HEART CATH AND CORONARY ANGIOGRAPHY;  Surgeon: Corky Crafts, MD;  Location: [facility] INVASIVE CV LAB;  Service: Cardiovascular;  Laterality: N/A;   MOUTH SURGERY     URETHRAL DILATION      OB History   No obstetric history on  file.      Home Medications    Prior to Admission medications   Medication Sig Start Date End Date Taking? Authorizing Provider  azithromycin (ZITHROMAX Z-PAK) 250 MG tablet Take two pills today followed by one a day until gone 03/17/22  Yes Eustace Moore, MD  fluticasone [facility]) 50 MCG/ACT nasal spray Place 1-2 sprays into both nostrils daily.   Yes [provider]  predniSONE (DELTASONE) 20 MG tablet Take 2 tablets (40 mg total) by mouth daily with breakfast. 03/17/22  Yes Eustace Moore, MD  acetaminophen (TYLENOL) 325 MG tablet Take 2 tablets (650 mg total) by mouth every 6 (six) hours as needed for mild pain (or Fever >/= 101). 12/19/19   Rodolph Bong, MD  amLODipine (NORVASC) 2.5 MG tablet Take 1 tablet (2.5 mg total) by mouth daily. 03/08/22   Ronney Asters, NP  B Complex-C (SUPER B COMPLEX PO) Take by mouth.    [provider]  BIOTIN PO Take 1 tablet by mouth daily.    [provider]  buPROPion (WELLBUTRIN XL) 150 MG 24 hr tablet Take 1 tablet by mouth daily. 01/31/21   [provider]  Cholecalciferol (VITAMIN D3) 125 MCG (5000 UT) CAPS Take 5,000 Units by mouth daily.    [provider]  clopidogrel (PLAVIX) 75 MG tablet Take 1 tablet (75 mg total) by mouth daily. 11/17/21   Alver Sorrow, NP  Cyanocobalamin (B-12 PO) Take 1 tablet by mouth daily.    [provider]  diclofenac Sodium (VOLTAREN) 1 % GEL Apply topically as needed.    [provider]  esomeprazole (NEXIUM 24HR CLEAR MINIS) 20 MG capsule Take 1 capsule (20 mg total) by mouth daily before breakfast. 12/19/19   Rodolph Bong, MD  Evolocumab (REPATHA SURECLICK) 140 MG/ML SOAJ Inject 140 mg into the skin every 14 (fourteen) days. 11/16/21   Hilty, Lisette Abu, MD  fexofenadine (ALLEGRA ALLERGY) 180 MG tablet Take 1 tablet (180 mg total) by mouth daily for 15 days. 09/28/21 11/17/21  Trevor Iha, FNP  gabapentin (NEURONTIN) 100 MG capsule  Take 3 capsules by mouth at bedtime. 01/30/21   [provider]  losartan (COZAAR) 50 MG tablet TAKE ONE TABLET BY MOUTH ONE TIME DAILY 01/07/22   Chilton Si, MD  metoprolol tartrate (LOPRESSOR) 25 MG tablet TAKE ONE-HALF TABLET BY MOUTH TWICE A DAY . 01/07/22   Hilty, Lisette Abu, MD  nitroGLYCERIN (NITROSTAT) 0.4 MG SL tablet Place 1 tablet (0.4 mg total) under the tongue as needed for chest pain. 11/17/21   Alver Sorrow, NP  Omega-3 Fatty Acids (OMEGA 3 PO) Take 1 capsule by mouth daily.    [provider]  Plant Sterols and Stanols (CHOLESTOFF PLUS PO) Take by mouth daily.    [provider]  Probiotic Product (PROBIOTIC ADVANCED PO) Take by mouth daily.    [provider]    Family History Family History  Problem Relation Age of Onset  Vascular Disease Mother    Dementia Mother    Dementia Father    Heart attack Maternal Grandmother    Heart attack Maternal Grandfather    Kidney failure Paternal Grandmother    Heart attack Paternal Grandfather     Social History Social History   Tobacco Use   Smoking status: Never   Smokeless tobacco: Never  Vaping Use   Vaping Use: Never used  Substance Use Topics   Alcohol use: Yes    Alcohol/week: 60.0 standard drinks of alcohol    Types: 60 Shots of liquor per week    Comment: vodka - 1500 ml every 4 days   Drug use: No     Allergies   Sulfa antibiotics, Ciprofloxacin, Depakote [divalproex sodium], Pine, Risperidone, Seroquel [quetiapine fumarate], Statins, Tramadol, Citalopram, Lamotrigine, Other, Topiramate, and Zetia [ezetimibe]   Review of Systems Review of Systems See HPI  Physical Exam Triage Vital Signs ED Triage Vitals  Enc Vitals Group     BP 03/17/22 1505 105/72     Pulse Rate 03/17/22 1505 76     Resp 03/17/22 1505 18     Temp 03/17/22 1505 98.9 F (37.2 C)     Temp Source 03/17/22 1505 Oral     SpO2 03/17/22 1505 97 %     Weight 03/17/22 1511 185 lb (83.9 kg)      Height 03/17/22 1511 5\' 3"  (1.6 m)     Head Circumference --      Peak Flow --      Pain Score 03/17/22 1509 4     Pain Loc --      Pain Edu? --      Excl. in GC? --    No data found.  Updated Vital Signs BP 105/72 (BP Location: Left Arm)   Pulse 76   Temp 98.9 F (37.2 C) (Oral)   Resp 18   Ht 5\' 3"  (1.6 m)   Wt 83.9 kg   SpO2 97%   BMI 32.77 kg/m      Physical Exam Constitutional:      General: She is not in acute distress.    Appearance: She is well-developed.     Comments: Pleasant.  Overweight.  Somewhat tearful  HENT:     Head: Normocephalic and atraumatic.     Right Ear: Tympanic membrane and ear canal normal.     Left Ear: Tympanic membrane and ear canal normal.     Nose: Congestion and rhinorrhea present.     Mouth/Throat:     Pharynx: Uvula midline. Posterior oropharyngeal erythema present.     Tonsils: No tonsillar exudate. 0 on the right. 0 on the left.  Eyes:     Conjunctiva/sclera: Conjunctivae normal.     Pupils: Pupils are equal, round, and reactive to light.  Cardiovascular:     Rate and Rhythm: Normal rate and regular rhythm.     Heart sounds: Normal heart sounds.  Pulmonary:     Effort: Pulmonary effort is normal. No respiratory distress.     Breath sounds: Wheezing present.     Comments: Scattered inspiratory wheeze Abdominal:     General: There is no distension.     Palpations: Abdomen is soft.  Musculoskeletal:        General: Normal range of motion.     Cervical back: Normal range of motion.  Lymphadenopathy:     Cervical: Cervical adenopathy present.  Skin:    General: Skin is warm and dry.  Neurological:  General: No focal deficit present.     Mental Status: She is alert.      UC Treatments / Results  Labs (all labs ordered are listed, but only abnormal results are displayed) Labs Reviewed - No data to display  EKG   Radiology No results found.  Procedures Procedures (including critical care time)  Medications  Ordered in UC Medications - No data to display  Initial Impression / Assessment and Plan / UC Course  I have reviewed the triage vital signs and the nursing notes.  Pertinent labs & imaging results that were available during my care of the patient were reviewed by me and considered in my medical decision making (see chart for details).    Discussed treatment Importance of ongoing primary care is emphasized Return as needed Final Clinical Impressions(s) / UC Diagnoses   Final diagnoses:  Viral upper respiratory tract infection  Wheezing     Discharge Instructions      Take the antibiotic as directed.  Take 2 pills today then 1 a day until gone Take prednisone once a day Drink lots of fluids Call your doctor if not improving by next week   ED Prescriptions     Medication Sig Dispense Auth. Provider   predniSONE (DELTASONE) 20 MG tablet Take 2 tablets (40 mg total) by mouth daily with breakfast. 10 tablet Eustace Moore, MD   azithromycin (ZITHROMAX Z-PAK) 250 MG tablet Take two pills today followed by one a day until gone 6 tablet Delton See Letta Pate, MD      PDMP not reviewed this encounter.   Eustace Moore, MD 03/17/22 [phone]

## 2022-03-17 NOTE — ED Notes (Signed)
RN walked pt to Primary Care office to see if she could establish a PCP  appointment

## 2022-03-17 NOTE — ED Triage Notes (Addendum)
Sinus drainage & sore throat x 1 week  Fatigue & brain fog  Gargles with  hot water & salt & straight hydrogen peroxide  Normally takes allegra &  flonase - none x 1 week - no monies for meds per pt  No thermometer at home  No home COVID test  Malachi Carl Virus history

## 2022-03-17 NOTE — Discharge Instructions (Signed)
Take the antibiotic as directed.  Take 2 pills today then 1 a day until gone Take prednisone once a day Drink lots of fluids Call your doctor if not improving by next week

## 2022-03-18 DIAGNOSIS — M25531 Pain in right wrist: Secondary | ICD-10-CM | POA: Diagnosis not present

## 2022-03-18 DIAGNOSIS — F32A Depression, unspecified: Secondary | ICD-10-CM | POA: Diagnosis not present

## 2022-03-18 DIAGNOSIS — M25539 Pain in unspecified wrist: Secondary | ICD-10-CM | POA: Diagnosis not present

## 2022-03-18 DIAGNOSIS — N390 Urinary tract infection, site not specified: Secondary | ICD-10-CM | POA: Diagnosis not present

## 2022-03-18 DIAGNOSIS — M19041 Primary osteoarthritis, right hand: Secondary | ICD-10-CM | POA: Diagnosis not present

## 2022-03-18 DIAGNOSIS — R609 Edema, unspecified: Secondary | ICD-10-CM | POA: Diagnosis not present

## 2022-03-18 DIAGNOSIS — M7989 Other specified soft tissue disorders: Secondary | ICD-10-CM | POA: Diagnosis not present

## 2022-03-18 DIAGNOSIS — R45851 Suicidal ideations: Secondary | ICD-10-CM | POA: Diagnosis not present

## 2022-03-19 DIAGNOSIS — F314 Bipolar disorder, current episode depressed, severe, without psychotic features: Secondary | ICD-10-CM | POA: Diagnosis not present

## 2022-03-19 DIAGNOSIS — R45851 Suicidal ideations: Secondary | ICD-10-CM | POA: Diagnosis not present

## 2022-03-25 ENCOUNTER — Telehealth: Payer: Self-pay | Admitting: Cardiovascular Disease

## 2022-03-25 NOTE — Telephone Encounter (Signed)
Pt c/o medication issue:  1. Name of Medication:   Evolocumab (REPATHA SURECLICK) 140 MG/ML SOAJ    2. How are you currently taking this medication (dosage and times per day)?  Inject 140 mg into the skin every 14 (fourteen) days. 3. Are you having a reaction (difficulty breathing--STAT)? No  4. What is your medication issue? Pt calling to see if grant for medication is able to be renewed. Pt states that she has been without medication. Please advise

## 2022-03-25 NOTE — Telephone Encounter (Signed)
Will forward to Pharm D for review  

## 2022-03-31 ENCOUNTER — Telehealth: Payer: Self-pay | Admitting: Family Medicine

## 2022-03-31 MED ORDER — REPATHA SURECLICK 140 MG/ML ~~LOC~~ SOAJ: 140.0000 mg | SUBCUTANEOUS | 3 refills | Status: DC

## 2022-03-31 NOTE — Telephone Encounter (Signed)
PA was renewed to 07/25/22.  Advised patient to get 3 month supply in Dec, to help avoid any delays in getting year end PA's completed.

## 2022-03-31 NOTE — Telephone Encounter (Signed)
Patient arrived at practice and requested paperwork to be filled out and signed by PCP. Patient has yet to establish with Dr.Wachs but would like a signature to return to work earlier than planned, prior to her visit establishing care. Patient was notified that it may or may not be approved by PCP and that it does have a 3-5 day wait time, as well as a potential fee. Paperwork and records were left in future PCP (Dr. Tamera Punt) box. Katha Hamming

## 2022-04-14 NOTE — Progress Notes (Unsigned)
New Patient Office Visit  Subjective    Patient ID: Lindsay [name], female    DOB: [birth year]  Age: 66 y.o. MRN: [id number]  CC: No chief complaint on file.   HPI Lindsay [name] presents to establish care ***  Outpatient Encounter Medications as of 04/15/2022  Medication Sig   acetaminophen (TYLENOL) 325 MG tablet Take 2 tablets (650 mg total) by mouth every 6 (six) hours as needed for mild pain (or Fever >/= 101).   amLODipine (NORVASC) 2.5 MG tablet Take 1 tablet (2.5 mg total) by mouth daily.   azithromycin (ZITHROMAX Z-PAK) 250 MG tablet Take two pills today followed by one a day until gone   B Complex-C (SUPER B COMPLEX PO) Take by mouth.   BIOTIN PO Take 1 tablet by mouth daily.   buPROPion (WELLBUTRIN XL) 150 MG 24 hr tablet Take 1 tablet by mouth daily.   Cholecalciferol (VITAMIN D3) 125 MCG (5000 UT) CAPS Take 5,000 Units by mouth daily.   clopidogrel (PLAVIX) 75 MG tablet Take 1 tablet (75 mg total) by mouth daily.   Cyanocobalamin (B-12 PO) Take 1 tablet by mouth daily.   diclofenac Sodium (VOLTAREN) 1 % GEL Apply topically as needed.   esomeprazole (NEXIUM 24HR CLEAR MINIS) 20 MG capsule Take 1 capsule (20 mg total) by mouth daily before breakfast.   Evolocumab (REPATHA SURECLICK) 833 MG/ML SOAJ Inject 140 mg into the skin every 14 (fourteen) days.   fexofenadine (ALLEGRA ALLERGY) 180 MG tablet Take 1 tablet (180 mg total) by mouth daily for 15 days.   fluticasone (FLONASE) 50 MCG/ACT nasal spray Place 1-2 sprays into both nostrils daily.   gabapentin (NEURONTIN) 100 MG capsule Take 3 capsules by mouth at bedtime.   losartan (COZAAR) 50 MG tablet TAKE ONE TABLET BY MOUTH ONE TIME DAILY   metoprolol tartrate (LOPRESSOR) 25 MG tablet TAKE ONE-HALF TABLET BY MOUTH TWICE A DAY .   nitroGLYCERIN (NITROSTAT) 0.4 MG SL tablet Place 1 tablet (0.4 mg total) under the tongue as needed for chest pain.   Omega-3 Fatty Acids (OMEGA 3 PO) Take 1 capsule by mouth daily.   Plant Sterols and  Stanols (CHOLESTOFF PLUS PO) Take by mouth daily.   predniSONE (DELTASONE) 20 MG tablet Take 2 tablets (40 mg total) by mouth daily with breakfast.   Probiotic Product (PROBIOTIC ADVANCED PO) Take by mouth daily.   No facility-administered encounter medications on file as of 04/15/2022.    Past Medical History:  Diagnosis Date   Anginal pain ([place])    Atrial fibrillation ([place])    Bipolar disorder ([place])    Cataract    OU   Coronary artery disease involving native coronary artery with angina pectoris ([place]) 03/24/2015   Dysrhythmia    GERD (gastroesophageal reflux disease)    History of appendectomy 1963   Hypertension    Hypertensive retinopathy    OU   Myocardial infarction ([place])    per pt due to Vit D deficiency   Syncope 11/2019   Vitamin D deficiency     Past Surgical History:  Procedure Laterality Date   APPENDECTOMY     CARDIAC CATHETERIZATION N/A 03/25/2015   Procedure: Left Heart Cath and Coronary Angiography;  Surgeon: Leonie Man, MD;  Location: [place] CV LAB;  Service: Cardiovascular;  Laterality: N/A;   CORONARY STENT INTERVENTION N/A 10/09/2018   Procedure: CORONARY STENT INTERVENTION;  Surgeon: Jettie Booze, MD;  Location: [place] CV LAB;  Service: Cardiovascular;  Laterality: N/A;  DILATATION & CURETTAGE/HYSTEROSCOPY WITH MYOSURE N/A 05/19/2016   Procedure: DILATATION & CURETTAGE/HYSTEROSCOPY WITH MYOSURE;  Surgeon: Geryl Rankins, MD;  Location: [facility];  Service: Gynecology;  Laterality: N/A;  Possible Myosure for polyps.   EYE SURGERY     LEFT HEART CATH AND CORONARY ANGIOGRAPHY N/A 10/09/2018   Procedure: LEFT HEART CATH AND CORONARY ANGIOGRAPHY;  Surgeon: Corky Crafts, MD;  Location[facility] INVASIVE CV LAB;  Service: Cardiovascular;  Laterality: N/A;   MOUTH SURGERY     URETHRAL DILATION      Family History  Problem Relation Age of Onset   Vascular Disease Mother    Dementia Mother    Dementia Father    Heart attack Maternal  Grandmother    Heart attack Maternal Grandfather    Kidney failure Paternal Grandmother    Heart attack Paternal Grandfather     Social History   Socioeconomic History   Marital status: Single    Spouse name: Not on file   Number of children: 0   Years of education: 16   Highest education level: Not on file  Occupational History   Not on file  Tobacco Use   Smoking status: Never   Smokeless tobacco: Never  Vaping Use   Vaping Use: Never used  Substance and Sexual Activity   Alcohol use: Yes    Alcohol/week: 60.0 standard drinks of alcohol    Types: 60 Shots of liquor per week    Comment: vodka - 1500 ml every 4 days   Drug use: No   Sexual activity: Not on file  Other Topics Concern   Not on file  Social History Narrative   Right handed   Drinks caffeine   Split level   Social Determinants of Health   Financial Resource Strain: High Risk (11/06/2020)   Overall Financial Resource Strain (CARDIA)    Difficulty of Paying Living Expenses: Hard  Food Insecurity: No Food Insecurity (11/06/2020)   Hunger Vital Sign    Worried About Running Out of Food in the Last Year: Never true    Ran Out of Food in the Last Year: Never true  Transportation Needs: Unmet Transportation Needs (11/06/2020)   PRAPARE - Administrator, Civil Service (Medical): Yes    Lack of Transportation (Non-Medical): Yes  Physical Activity: Not on file  Stress: Not on file  Social Connections: Not on file  Intimate Partner Violence: Not on file    ROS      Objective    There were no vitals taken for this visit.  Physical Exam  {Labs (Optional):23779}    Assessment & Plan:   Problem List Items Addressed This Visit   None   No follow-ups on file.   Charlton Amor, DO

## 2022-04-15 ENCOUNTER — Ambulatory Visit (INDEPENDENT_AMBULATORY_CARE_PROVIDER_SITE_OTHER): Payer: Medicare Other | Admitting: Family Medicine

## 2022-04-15 ENCOUNTER — Encounter: Payer: Self-pay | Admitting: Family Medicine

## 2022-04-15 VITALS — BP 121/80 | HR 73 | Ht 62.0 in | Wt 189.0 lb

## 2022-04-15 DIAGNOSIS — R1032 Left lower quadrant pain: Secondary | ICD-10-CM | POA: Diagnosis not present

## 2022-04-15 DIAGNOSIS — F319 Bipolar disorder, unspecified: Secondary | ICD-10-CM | POA: Diagnosis not present

## 2022-04-15 DIAGNOSIS — H0015 Chalazion left lower eyelid: Secondary | ICD-10-CM | POA: Insufficient documentation

## 2022-04-15 DIAGNOSIS — Z23 Encounter for immunization: Secondary | ICD-10-CM | POA: Diagnosis not present

## 2022-04-15 DIAGNOSIS — M25551 Pain in right hip: Secondary | ICD-10-CM | POA: Diagnosis not present

## 2022-04-15 DIAGNOSIS — R1900 Intra-abdominal and pelvic swelling, mass and lump, unspecified site: Secondary | ICD-10-CM

## 2022-04-15 DIAGNOSIS — M25532 Pain in left wrist: Secondary | ICD-10-CM | POA: Diagnosis not present

## 2022-04-15 DIAGNOSIS — M25531 Pain in right wrist: Secondary | ICD-10-CM | POA: Diagnosis not present

## 2022-04-15 HISTORY — DX: Chalazion left lower eyelid: H00.15

## 2022-04-15 HISTORY — DX: Left lower quadrant pain: R10.32

## 2022-04-15 NOTE — Assessment & Plan Note (Signed)
-   pt says mass was found on imaging incidentally when done by [facility]  - have ordered CT abd/pelvis to assess further mass - pt had significant LLQ pain on exam

## 2022-04-15 NOTE — Addendum Note (Signed)
Addended by: Tarri Glenn A on: 04/15/2022 03:19 PM   Modules accepted: Orders

## 2022-04-15 NOTE — Assessment & Plan Note (Signed)
-   pt due for flu and pna vaccine - flu and pcv 20 given today

## 2022-04-15 NOTE — Assessment & Plan Note (Signed)
-   have sent referral to psychiatrist  - she is doing well with this

## 2022-04-15 NOTE — Assessment & Plan Note (Signed)
-   ref to ophthalmology unclear if this is a chalazion or what this is

## 2022-04-19 ENCOUNTER — Ambulatory Visit (INDEPENDENT_AMBULATORY_CARE_PROVIDER_SITE_OTHER): Payer: Medicare Other

## 2022-04-19 DIAGNOSIS — R1900 Intra-abdominal and pelvic swelling, mass and lump, unspecified site: Secondary | ICD-10-CM

## 2022-04-19 DIAGNOSIS — R109 Unspecified abdominal pain: Secondary | ICD-10-CM | POA: Diagnosis not present

## 2022-04-19 DIAGNOSIS — R1032 Left lower quadrant pain: Secondary | ICD-10-CM

## 2022-04-19 DIAGNOSIS — K760 Fatty (change of) liver, not elsewhere classified: Secondary | ICD-10-CM | POA: Diagnosis not present

## 2022-04-19 MED ORDER — IOHEXOL 300 MG/ML  SOLN
100.0000 mL | Freq: Once | INTRAMUSCULAR | Status: AC | PRN
  Administered 2022-04-19: 100 mL via INTRAVENOUS

## 2022-04-20 DIAGNOSIS — M65341 Trigger finger, right ring finger: Secondary | ICD-10-CM | POA: Diagnosis not present

## 2022-04-20 DIAGNOSIS — M189 Osteoarthritis of first carpometacarpal joint, unspecified: Secondary | ICD-10-CM

## 2022-04-20 DIAGNOSIS — M25531 Pain in right wrist: Secondary | ICD-10-CM | POA: Diagnosis not present

## 2022-04-20 DIAGNOSIS — M25532 Pain in left wrist: Secondary | ICD-10-CM | POA: Diagnosis not present

## 2022-04-20 HISTORY — DX: Trigger finger, right ring finger: M65.341

## 2022-04-20 HISTORY — DX: Osteoarthritis of first carpometacarpal joint, unspecified: M18.9

## 2022-04-20 HISTORY — DX: Pain in right wrist: M25.531

## 2022-04-22 ENCOUNTER — Ambulatory Visit (INDEPENDENT_AMBULATORY_CARE_PROVIDER_SITE_OTHER): Payer: Medicare Other | Admitting: Family Medicine

## 2022-04-22 ENCOUNTER — Encounter: Payer: Self-pay | Admitting: Family Medicine

## 2022-04-22 VITALS — BP 139/81 | HR 79 | Ht 62.0 in | Wt 183.0 lb

## 2022-04-22 DIAGNOSIS — K760 Fatty (change of) liver, not elsewhere classified: Secondary | ICD-10-CM

## 2022-04-22 DIAGNOSIS — Z712 Person consulting for explanation of examination or test findings: Secondary | ICD-10-CM

## 2022-04-22 HISTORY — DX: Fatty (change of) liver, not elsewhere classified: K76.0

## 2022-04-22 NOTE — Progress Notes (Signed)
Established patient visit   Patient: Lindsay [name]   DOB: 10-01-1955   66 y.o. Female  MRN: [id number] Visit Date: 04/22/2022  Today's healthcare provider: Charlton Amor, DO   Chief Complaint  Patient presents with   Follow-up    SUBJECTIVE    Chief Complaint  Patient presents with   Follow-up   HPI  Pt presents today to discuss CT abdomen results.   Review of Systems  Constitutional:  Negative for activity change, fatigue and fever.  Respiratory:  Negative for cough and shortness of breath.   Cardiovascular:  Negative for chest pain.  Gastrointestinal:  Negative for abdominal pain.  Genitourinary:  Negative for difficulty urinating.       Current Meds  Medication Sig   acetaminophen (TYLENOL) 325 MG tablet Take 2 tablets (650 mg total) by mouth every 6 (six) hours as needed for mild pain (or Fever >/= 101).   amLODipine (NORVASC) 2.5 MG tablet Take 1 tablet (2.5 mg total) by mouth daily.   azithromycin (ZITHROMAX Z-PAK) 250 MG tablet Take two pills today followed by one a day until gone   B Complex-C (SUPER B COMPLEX PO) Take by mouth.   BIOTIN PO Take 1 tablet by mouth daily.   buPROPion (WELLBUTRIN XL) 150 MG 24 hr tablet Take 1 tablet by mouth daily.   Cholecalciferol (VITAMIN D3) 125 MCG (5000 UT) CAPS Take 5,000 Units by mouth daily.   clopidogrel (PLAVIX) 75 MG tablet Take 1 tablet (75 mg total) by mouth daily.   Cyanocobalamin (B-12 PO) Take 1 tablet by mouth daily.   diclofenac Sodium (VOLTAREN) 1 % GEL Apply topically as needed.   esomeprazole (NEXIUM 24HR CLEAR MINIS) 20 MG capsule Take 1 capsule (20 mg total) by mouth daily before breakfast.   Evolocumab (REPATHA SURECLICK) 140 MG/ML SOAJ Inject 140 mg into the skin every 14 (fourteen) days.   fluticasone (FLONASE) 50 MCG/ACT nasal spray Place 1-2 sprays into both nostrils daily.   gabapentin (NEURONTIN) 100 MG capsule Take 3 capsules by mouth at bedtime.   losartan (COZAAR) 50 MG tablet TAKE ONE TABLET BY  MOUTH ONE TIME DAILY   metoprolol tartrate (LOPRESSOR) 25 MG tablet TAKE ONE-HALF TABLET BY MOUTH TWICE A DAY .   nitroGLYCERIN (NITROSTAT) 0.4 MG SL tablet Place 1 tablet (0.4 mg total) under the tongue as needed for chest pain.   Omega-3 Fatty Acids (OMEGA 3 PO) Take 1 capsule by mouth daily.   Plant Sterols and Stanols (CHOLESTOFF PLUS PO) Take by mouth daily.   predniSONE (DELTASONE) 20 MG tablet Take 2 tablets (40 mg total) by mouth daily with breakfast.   Probiotic Product (PROBIOTIC ADVANCED PO) Take by mouth daily.    OBJECTIVE    BP 139/81   Pulse 79   Ht 5\' 2"  (1.575 m)   Wt 183 lb (83 kg)   SpO2 99%   BMI 33.47 kg/m   Physical Exam Vitals reviewed.  Constitutional:      Appearance: She is well-developed.  HENT:     Head: Normocephalic and atraumatic.  Eyes:     Conjunctiva/sclera: Conjunctivae normal.  Cardiovascular:     Rate and Rhythm: Normal rate.  Pulmonary:     Effort: Pulmonary effort is normal.  Skin:    General: Skin is dry.     Coloration: Skin is not pale.  Neurological:     Mental Status: She is alert and oriented to person, place, and time.  Psychiatric:  Behavior: Behavior normal.       ASSESSMENT & PLAN    Problem List Items Addressed This Visit       Digestive   Hepatic steatosis    - discussed diet and exercise  - will follow up in 2-3 months to do weight check  - she plans to go [place] [employer] and get in their exercise program        Other   Encounter to discuss test results - Primary    CT abdomen did not show any masses that she had mentioned was seen by [facility]. Her CT did show some liver changes so she will need to continue to stay away from alcohol and try and lose some weight. They saw a chronic mass which they called a calcified fibroid that has been chronic and seen on other exams. Overall nothing new seen on CT scan.        Return in about 3 months (around 07/22/2022) for weight check.      Owens Loffler, DO   [facility] [phone] (phone) [phone] (fax)  [place]

## 2022-04-22 NOTE — Assessment & Plan Note (Signed)
CT abdomen did not show any masses that she had mentioned was seen by [facility]. Her CT did show some liver changes so she will need to continue to stay away from alcohol and try and lose some weight. They saw a chronic mass which they called a calcified fibroid that has been chronic and seen on other exams. Overall nothing new seen on CT scan.

## 2022-04-22 NOTE — Assessment & Plan Note (Signed)
-   discussed diet and exercise  - will follow up in 2-3 months to do weight check  - she plans to go to [employer] and get in their exercise program

## 2022-04-29 ENCOUNTER — Ambulatory Visit (INDEPENDENT_AMBULATORY_CARE_PROVIDER_SITE_OTHER): Payer: Medicare Other | Admitting: Physician Assistant

## 2022-04-29 ENCOUNTER — Encounter: Payer: Self-pay | Admitting: Physician Assistant

## 2022-04-29 VITALS — BP 121/75 | HR 96 | Ht 62.0 in | Wt 180.1 lb

## 2022-04-29 DIAGNOSIS — J309 Allergic rhinitis, unspecified: Secondary | ICD-10-CM

## 2022-04-29 DIAGNOSIS — H1013 Acute atopic conjunctivitis, bilateral: Secondary | ICD-10-CM

## 2022-04-29 DIAGNOSIS — H539 Unspecified visual disturbance: Secondary | ICD-10-CM

## 2022-04-29 DIAGNOSIS — I25118 Atherosclerotic heart disease of native coronary artery with other forms of angina pectoris: Secondary | ICD-10-CM

## 2022-04-29 MED ORDER — AZELASTINE HCL 0.05 % OP SOLN: 2.0000 [drp] | Freq: Two times a day (BID) | OPHTHALMIC | 5 refills | Status: DC

## 2022-04-29 NOTE — Patient Instructions (Signed)
Will make referral to eye doctor Note for work given [employer] for allergic conjunctivitis to try  Allergic Conjunctivitis, Adult Allergic conjunctivitis is inflammation of the conjunctiva. The conjunctiva is the thin, clear membrane that covers the white part of the eye and the inner surface of the eyelid. Allergies can affect this layer of the eye. In this condition: The blood vessels in the conjunctiva swell and become irritated. The eyes become red or pink and feel itchy. There is often a watery discharge from the eyes. Allergic conjunctivitis is not contagious. This means it cannot be spread from person to person. The condition can develop at any age and may be outgrown. What are the causes? This condition is caused by allergens. These are things that can cause an allergic reaction in some people. Common allergens include: Outdoor allergens, such as: Pollen, including pollen from grass and weeds. Mold spores. Car fumes. Pollution. Indoor allergens, such as: Dust. Smoke. Mold spores. Proteins in a pet's urine, saliva, or dander. Protein buildup on contact lenses. What increases the risk? You may be more likely to develop this condition if you have a family history of these things: Allergies. Conditions caused by being exposed to allergens, such as: Allergic rhinitis. This is an allergic reaction that affects the nose. Bronchial asthma. This condition affects the large airways in the lungs and makes breathing difficult. Atopic dermatitis (eczema). This is inflammation of the skin that is long-term (chronic). What are the signs or symptoms? Symptoms of this condition include eyes that are itchy, red, watery, or puffy. Your eyes may also: Sting or burn. Have clear fluid draining from them. Have thick mucus discharge and pain (vernal conjunctivitis). This happens in severe cases. How is this diagnosed? This condition may be diagnosed based on: Your medical history. A physical  exam, including an eye exam. Tests of the fluid draining from your eyes to rule out other causes. Other tests to confirm the diagnosis, including: Testing for allergies. The skin may be pricked with a tiny needle. The pricked area is then exposed to small amounts of allergens. Testing for other eye conditions. Tests may include: Blood tests. Tissue scrapings from your eyelid. The tissue is then checked under a microscope. How is this treated? Treatment for this condition may include: Using cold, wet cloths (cold compresses) to soothe itching and swelling. Washing your face and hair. Also, washing your clothes often to remove allergens. Using eye drops. These may be prescription or over-the-counter. You may need to try different types to see which one works best for you. Examples include: Eye drops that wash allergens out of the eyes (preservative-free artificial tears). Eye drops that block the allergic reaction (antihistamine). Eye drops that reduce swelling and irritation (anti-inflammatory). Steroid eye drops, which may be given if other treatments have not worked. Oral antihistamine medicines. These are medicines taken by mouth to lessen your allergic reaction. You may need these if eye drops do not help or are difficult to use. An air purifier at home and work. Wraparound sunglasses. This may help to decrease the amount of allergens reaching the eye. Not wearing contact lenses until symptoms improve, if the condition was caused by contact lenses. Change to daily wear disposable contact lenses, if possible. Follow these instructions at home: Eye care Apply a clean, cold compress to your eyes for 10-20 minutes, 3-4 times a day. Do not touch or rub your eyes. Do not wear contact lenses until the inflammation is gone. Wear glasses instead. Do not wear  eye makeup until the inflammation is gone. General instructions Avoid known allergens whenever possible. Take or apply over-the-counter  and prescription medicines only as told by your health care provider. These include any eye drops. Drink enough fluid to keep your urine pale yellow. Keep all follow-up visits. Contact a health care provider if: Your symptoms get worse or do not get better with treatment. You have mild eye pain. You become sensitive to light. You have spots or blisters on your eyes. You have a fever. Get help right away if: You have redness, swelling, or other symptoms in only one eye. Your vision is blurred or you have other vision changes. You have pus draining from your eyes. You have severe eye pain. Summary Allergic conjunctivitis is inflammation of the eye that is caused by allergens. It affects the clear membrane that covers the white part of the eye and the inner surface of the eyelid. It often causes eye itching, redness, and a watery discharge. Take or apply over-the-counter and prescription medicines only as told by your health care provider. These include eye drops. Do not touch or rub your eyes. Contact a health care provider if your symptoms get worse or do not get better with treatment. This information is not intended to replace advice given to you by your health care provider. Make sure you discuss any questions you have with your health care provider. Document Revised: 09/21/2021 Document Reviewed: 09/21/2021 Elsevier Patient Education  2023 [employer].

## 2022-04-29 NOTE — Progress Notes (Signed)
Acute Office Visit  Subjective:     Patient ID: Lindsay [name], female    DOB: [birth year], 66 y.o.   MRN: [id number]  Chief Complaint  Patient presents with   changes in visual field    C/o dot in bilateral vision field  and Left eye blurriness that has been coming and going  x around 2 years     HPI Patient is in today for vision blurriness that started this morning. It is an intermittent problem that she has but has not happened in a while. She does not have an eye doctor right now and has been a while since she has been seen by anyone. Both eyes are blurry but she has a red dot in her left eye in central vision. It went away before her appt today. She does have bilateral watery itchy eyes due to her cats and allergies. She does not use anything to help with this. No eye pain or sensitivity. No eye trauma or injury. No sinus pressure, body aches, ST, ear pain. She does admit to some "white gel like stuff that comes out of hair follicles of her eyes from time to time". She was not able to work today.   ROS  See HPI.     Objective:    BP 121/75   Pulse 96   Ht 5\' 2"  (1.575 m)   Wt 180 lb 1.9 oz (81.7 kg)   SpO2 100%   BMI 32.94 kg/m  BP Readings from Last 3 Encounters:  04/29/22 121/75  04/22/22 139/81  04/15/22 121/80   Wt Readings from Last 3 Encounters:  04/29/22 180 lb 1.9 oz (81.7 kg)  04/22/22 183 lb (83 kg)  04/15/22 189 lb (85.7 kg)      Physical Exam Constitutional:      Appearance: Normal appearance.  HENT:     Head: Normocephalic.     Right Ear: Tympanic membrane, ear canal and external ear normal. There is no impacted cerumen.     Left Ear: Tympanic membrane, ear canal and external ear normal. There is no impacted cerumen.     Nose: Nose normal. No congestion.     Mouth/Throat:     Mouth: Mucous membranes are moist.     Pharynx: No oropharyngeal exudate or posterior oropharyngeal erythema.  Eyes:     General:        Right eye: Discharge present.         Left eye: Discharge present.    Extraocular Movements: Extraocular movements intact.     Pupils: Pupils are equal, round, and reactive to light.     Comments: Bilateral mild injected conjunctivitis with watery discharge  Neurological:     Mental Status: She is alert.     .. Vision Screening   Right eye Left eye Both eyes  Without correction 20/15 20/15 20/20   With correction           Assessment & Plan:  04/17/22 Mccall was seen today for changes in visual field.  Diagnoses and all orders for this visit:  Vision changes -     Ambulatory referral to Ophthalmology  Allergic conjunctivitis and rhinitis, bilateral -     Ambulatory referral to Ophthalmology -     azelastine (OPTIVAR) 0.05 % ophthalmic solution; Place 2 drops into both eyes 2 (two) times daily.   Vision was great, no concerns No red flag on exam Vitals and BP look great She does have what appears to be allergic conjunctivitis that is  causing some injection and perhaps some vision blurriness Trial of optivar eye drops Consider flonase and oral anti-histamine like claritin Does not explain the "red dot" in central vision field.  Referral to eye doctor made Follow up as needed or if symptoms change or worsen  Iran Planas, PA-C

## 2022-04-30 ENCOUNTER — Ambulatory Visit
Admission: EM | Admit: 2022-04-30 | Discharge: 2022-04-30 | Disposition: A | Discharge status: Home / Self Care | Payer: Medicare Other | Attending: Family Medicine | Admitting: Family Medicine

## 2022-04-30 ENCOUNTER — Telehealth: Payer: Self-pay | Admitting: Cardiovascular Disease

## 2022-04-30 DIAGNOSIS — Z1152 Encounter for screening for COVID-19: Secondary | ICD-10-CM | POA: Insufficient documentation

## 2022-04-30 DIAGNOSIS — R509 Fever, unspecified: Secondary | ICD-10-CM | POA: Diagnosis not present

## 2022-04-30 LAB — POCT INFLUENZA A/B
Influenza A, POC: NEGATIVE
Influenza B, POC: NEGATIVE

## 2022-04-30 LAB — RESP PANEL BY RT-PCR (FLU A&B, COVID) ARPGX2
Influenza A by PCR: NEGATIVE
Influenza B by PCR: NEGATIVE
SARS Coronavirus 2 by RT PCR: NEGATIVE

## 2022-04-30 LAB — POC SARS CORONAVIRUS 2 AG -  ED: SARS Coronavirus 2 Ag: NEGATIVE

## 2022-04-30 MED ORDER — ACETAMINOPHEN 500 MG PO TABS
1000.0000 mg | ORAL_TABLET | Freq: Once | ORAL | Status: AC
  Administered 2022-04-30: 1000 mg via ORAL

## 2022-04-30 MED ORDER — NIRMATRELVIR/RITONAVIR (PAXLOVID)TABLET: 3.0000 | ORAL_TABLET | Freq: Two times a day (BID) | ORAL | 0 refills | Status: AC

## 2022-04-30 NOTE — Telephone Encounter (Signed)
  Pt c/o medication issue:  1. Name of Medication: Aleve  2. How are you currently taking this medication (dosage and times per day)?   3. Are you having a reaction (difficulty breathing--STAT)?   4. What is your medication issue? Pt said, she's been sick with fever, nausea and vomiting. She would like to ask if its ok if she take aleve

## 2022-04-30 NOTE — Telephone Encounter (Signed)
Would be reasonable to take Aleve as needed. Would use sparingly due to her history of GI bleed. May also use Tylenol for fever. Recommend she follow up with primary care provider regarding symptoms.  Loel Dubonnet, NP

## 2022-04-30 NOTE — ED Triage Notes (Signed)
Pt presents to Urgent Care with c/o fatigue x 3 days; developed fever, cough, emesis, abdominal pain, chest pain, back pain, headache, and dizziness yesterday. Has not done COVID test.

## 2022-04-30 NOTE — Telephone Encounter (Signed)
Advised patient, verbalized understanding  

## 2022-04-30 NOTE — ED Provider Notes (Signed)
[name]    CSN: [id number] Arrival date & time: 04/30/22  1336      History   Chief Complaint Chief Complaint  Patient presents with   Fever   Emesis   Cough    HPI Lindsay [name] is a 66 y.o. female.   HPI Pleasant 66 year old female presents with fever, cough, vomiting, and fatigue x 3 days.  Additionally patient reports intermittent abdominal pain, chest pain, back pain, headache and dizziness that occurred infrequently yesterday.  Patient reports has not performed COVID-19 test.  PMH significant for CAD, MI, chronic A-fib, and mood disorder.  Patient was just evaluated by her PCP for visual changes yesterday, 04/29/2022.  Past Medical History:  Diagnosis Date   Abnormal mammogram    Thick Tissue   Alcohol use    Anginal pain (HCC)    Anxiety    Arthritis    Atrial fibrillation (HCC)    Bipolar disorder (HCC)    Cataract    OU   Colon polyp    Compressed cervical disc    Coronary artery disease involving native coronary artery with angina pectoris ([place]) 03/24/2015   Depression    Dysrhythmia    GERD (gastroesophageal reflux disease)    History of appendectomy 1963   Hyperlipidemia    Hypertension    Hypertensive retinopathy    OU   Myocardial infarction ([place])    per pt due to Vit D deficiency   Syncope 11/2019   Vitamin D deficiency     Patient Active Problem List   Diagnosis Date Noted   Encounter to discuss test results 04/22/2022   Hepatic steatosis 04/22/2022   Immunization due 04/15/2022   Chalazion of left lower eyelid 04/15/2022   LLQ pain 04/15/2022   Chronic alcoholism ([place]) 03/17/2022   Cervical radiculopathy 10/14/2021   Bipolar 1 disorder ([place]) 01/28/2021   Pain in joint of right hip 07/03/2020   Rib pain 07/03/2020   Recurrent syncope    Orthostasis    Coronary artery disease involving native coronary artery of native heart 12/18/2019   Syncope 12/18/2019   Lactic acidosis 12/18/2019   Hematemesis 12/18/2019   Pain in  right knee 12/14/2018   PAF (paroxysmal atrial fibrillation) ([place]) 10/09/2018   Dorsalgia 09/22/2018   Dysphagia 09/22/2018   GERD without esophagitis 09/22/2018   Irritable bowel syndrome without diarrhea 09/22/2018   Major depressive disorder, single episode 09/22/2018   Menopausal and perimenopausal disorder 09/22/2018   Migraine without status migrainosus, not intractable 09/22/2018   Obesity 09/22/2018   Osteoarthritis of knee 09/22/2018   Polypharmacy 09/22/2018   Sciatica of right side 09/22/2018   Vitamin D deficiency 09/22/2018   Mixed hyperlipidemia 05/07/2016   Alcohol intoxication ([place]) 09/08/2015   Chronic atrial fibrillation ([place]) 09/08/2015   Mood disorder ([place]) 09/08/2015   Coronary artery disease involving native coronary artery with angina pectoris ([place]) 03/24/2015   Essential hypertension 03/03/2015   Chest pain 02/11/2015   Non-ST elevation MI (NSTEMI) - h/o 07/26/2008    Past Surgical History:  Procedure Laterality Date   APPENDECTOMY     CARDIAC CATHETERIZATION N/A 03/25/2015   Procedure: Left Heart Cath and Coronary Angiography;  Surgeon: Leonie Man, MD;  Location: [facility];  Service: Cardiovascular;  Laterality: N/A;   CORONARY STENT INTERVENTION N/A 10/09/2018   Procedure: CORONARY STENT INTERVENTION;  Surgeon: Jettie Booze, MD;  Location: [facility];  Service: Cardiovascular;  Laterality: N/A;   DILATATION & CURETTAGE/HYSTEROSCOPY WITH MYOSURE  N/A 05/19/2016   Procedure: DILATATION & CURETTAGE/HYSTEROSCOPY WITH MYOSURE;  Surgeon: Geryl Rankins, MD;  Location: [facility];  Service: Gynecology;  Laterality: N/A;  Possible Myosure for polyps.   EYE SURGERY     LEFT HEART CATH AND CORONARY ANGIOGRAPHY N/A 10/09/2018   Procedure: LEFT HEART CATH AND CORONARY ANGIOGRAPHY;  Surgeon: Corky Crafts, MD;  [facility];  Service: Cardiovascular;  Laterality: N/A;   MOUTH SURGERY     URETHRAL DILATION      OB  History   No obstetric history on file.      Home Medications    Prior to Admission medications   Medication Sig Start Date End Date Taking? Authorizing Provider  gabapentin (NEURONTIN) 100 MG capsule Take 3 capsules by mouth at bedtime. 01/30/21  Yes [provider]  nirmatrelvir/ritonavir EUA (PAXLOVID) 20 x 150 MG & 10 x 100MG  TABS Take 3 tablets by mouth 2 (two) times daily for 5 days. Patient GFR is 99. Take nirmatrelvir (150 mg) two tablets twice daily for 5 days and ritonavir (100 mg) one tablet twice daily for 5 days. 04/30/22 05/05/22 Yes 07/05/22, FNP  acetaminophen (TYLENOL) 325 MG tablet Take 2 tablets (650 mg total) by mouth every 6 (six) hours as needed for mild pain (or Fever >/= 101). 12/19/19   12/21/19, MD  amLODipine (NORVASC) 2.5 MG tablet Take 1 tablet (2.5 mg total) by mouth daily. 03/08/22   03/10/22, NP  azelastine (OPTIVAR) 0.05 % ophthalmic solution Place 2 drops into both eyes 2 (two) times daily. 04/29/22   Breeback, Jade L, PA-C  B Complex-C (SUPER B COMPLEX PO) Take by mouth.    [provider]  BIOTIN PO Take 1 tablet by mouth daily.    [provider]  buPROPion (WELLBUTRIN XL) 150 MG 24 hr tablet Take 1 tablet by mouth daily. 01/31/21   [provider]  Cholecalciferol (VITAMIN D3) 125 MCG (5000 UT) CAPS Take 5,000 Units by mouth daily.    [provider]  clopidogrel (PLAVIX) 75 MG tablet Take 1 tablet (75 mg total) by mouth daily. 11/17/21   11/19/21, NP  Cyanocobalamin (B-12 PO) Take 1 tablet by mouth daily.    [provider]  diclofenac Sodium (VOLTAREN) 1 % GEL Apply topically as needed.    [provider]  esomeprazole (NEXIUM 24HR CLEAR MINIS) 20 MG capsule Take 1 capsule (20 mg total) by mouth daily before breakfast. 12/19/19   12/21/19, MD  Evolocumab (REPATHA SURECLICK) 140 MG/ML SOAJ Inject 140 mg into the skin every 14 (fourteen) days. 03/31/22    05/31/22, MD  losartan (COZAAR) 50 MG tablet TAKE ONE TABLET BY MOUTH ONE TIME DAILY 01/07/22   01/09/22, MD  metoprolol tartrate (LOPRESSOR) 25 MG tablet TAKE ONE-HALF TABLET BY MOUTH TWICE A DAY . 01/07/22   Hilty, 01/09/22, MD  nitroGLYCERIN (NITROSTAT) 0.4 MG SL tablet Place 1 tablet (0.4 mg total) under the tongue as needed for chest pain. 11/17/21   11/19/21, NP  Omega-3 Fatty Acids (OMEGA 3 PO) Take 1 capsule by mouth daily.    [provider]  Plant Sterols and Stanols (CHOLESTOFF PLUS PO) Take by mouth daily.    [provider]  Probiotic Product (PROBIOTIC ADVANCED PO) Take by mouth daily.    [provider]  traZODone (DESYREL) 50 MG tablet Take 25 mg by mouth at bedtime.    [provider]    Family History Family History  Problem Relation Age of Onset   Vascular Disease Mother    Dementia Mother    Hypertension Mother    Stroke Mother    Dementia Father    Heart attack Father    Stroke Father    Heart attack Maternal Grandmother    Heart attack Maternal Grandfather    Kidney failure Paternal Grandmother    Heart attack Paternal Grandfather     Social History Social History   Tobacco Use   Smoking status: Never   Smokeless tobacco: Never  Vaping Use   Vaping Use: Never used  Substance Use Topics   Alcohol use: Not Currently    Comment: hx of alcohol abuse   Drug use: Never     Allergies   Sulfa antibiotics, Ciprofloxacin, Depakote [divalproex sodium], Pine, Risperidone, Seroquel [quetiapine fumarate], Statins, Tramadol, Citalopram, Lamotrigine, Other, Topiramate, Wasp venom, and Zetia [ezetimibe]   Review of Systems Review of Systems  Constitutional:  Positive for chills, fatigue and fever.  Respiratory:  Positive for cough.   Cardiovascular:  Positive for chest pain.  Gastrointestinal:  Positive for abdominal pain.  Musculoskeletal:  Positive for back pain.  All other systems reviewed and are  negative.    Physical Exam Triage Vital Signs ED Triage Vitals  Enc Vitals Group     BP 04/30/22 1353 124/79     Pulse Rate 04/30/22 1353 (!) 105     Resp 04/30/22 1353 (!) 24     Temp 04/30/22 1353 100 F (37.8 C)     Temp Source 04/30/22 1353 Oral     SpO2 04/30/22 1353 94 %     Weight 04/30/22 1350 180 lb (81.6 kg)     Height 04/30/22 1350 5' 3.5" (1.613 m)     Head Circumference --      Peak Flow --      Pain Score 04/30/22 1350 6     Pain Loc --      Pain Edu? --      Excl. in GC? --    No data found.  Updated Vital Signs BP 124/79 (BP Location: Right Arm)   Pulse (!) 105   Temp 100 F (37.8 C) (Oral)   Resp (!) 24   Ht 5' 3.5" (1.613 m)   Wt 180 lb (81.6 kg)   SpO2 94%   BMI 31.39 kg/m      Physical Exam Vitals and nursing note reviewed.  Constitutional:      General: She is not in acute distress.    Appearance: Normal appearance. She is normal weight. She is ill-appearing.  HENT:     Head: Normocephalic and atraumatic.     Right Ear: Tympanic membrane, ear canal and external ear normal.     Left Ear: Tympanic membrane, ear canal and external ear normal.     Nose: Nose normal.     Mouth/Throat:     Mouth: Mucous membranes are moist.     Pharynx: Oropharynx is clear.  Eyes:     Extraocular Movements: Extraocular movements intact.     Conjunctiva/sclera: Conjunctivae normal.     Pupils: Pupils are equal, round, and reactive to light.  Cardiovascular:     Rate and Rhythm: Normal rate and regular rhythm.     Pulses: Normal pulses.     Heart sounds: Normal heart sounds. No murmur heard. Pulmonary:     Effort: Pulmonary effort is normal.     Breath sounds:  Normal breath sounds. No wheezing, rhonchi or rales.     Comments: Infrequent nonproductive cough noted on exam Abdominal:     General: Abdomen is flat. Bowel sounds are normal. There is no distension.     Palpations: Abdomen is soft. There is no mass.     Tenderness: There is no abdominal  tenderness. There is no right CVA tenderness, left CVA tenderness, guarding or rebound.     Hernia: No hernia is present.  Musculoskeletal:        General: Normal range of motion.     Cervical back: Normal range of motion and neck supple.  Skin:    General: Skin is warm and dry.  Neurological:     General: No focal deficit present.     Mental Status: She is alert and oriented to person, place, and time.      UC Treatments / Results  Labs (all labs ordered are listed, but only abnormal results are displayed) Labs Reviewed  RESP PANEL BY RT-PCR (FLU A&B, COVID) [id number]  POCT INFLUENZA A/B  POC SARS CORONAVIRUS 2 AG -  ED    EKG   Radiology No results found.  Procedures Procedures (including critical care time)  Medications Ordered in UC Medications  acetaminophen (TYLENOL) tablet 1,000 mg (1,000 mg Oral Given 04/30/22 1424)    Initial Impression / Assessment and Plan / UC Course  I have reviewed the triage vital signs and the nursing notes.  Pertinent labs & imaging results that were available during my care of the patient were reviewed by me and considered in my medical decision making (see chart for details).     MDM: 1.  Fever, unspecified-Tylenol 1000 mg given once in clinic prior to discharge; rapid COVID and influenza were negative, lab [id number] ordered. Advised patient we will follow-up in the next 2 to 3 hours with COVID-19 results advised patient to hold Paxlovid until we contact her.  Advised patient may take OTC Tylenol 1000 to 4000 mg daily, as needed for fever.  Advised if symptoms worsen and/or unresolved please go to [facility] ED for further evaluation of her current symptoms.  Patient discharged home, hemodynamically stable. Final Clinical Impressions(s) / UC Diagnoses   Final diagnoses:  Fever, unspecified     Discharge Instructions      Advised patient we will follow-up in the next 2 to 3 hours with COVID-19 results advised  patient to hold Paxlovid until we contact her.  Advised patient may take OTC Tylenol 1000 to 4000 mg daily, as needed for fever.  Advised if symptoms worsen and/or unresolved please go to [facility] ED for further evaluation of her current symptoms.     ED Prescriptions     Medication Sig Dispense Auth. Provider   nirmatrelvir/ritonavir EUA (PAXLOVID) 20 x 150 MG & 10 x 100MG  TABS Take 3 tablets by mouth 2 (two) times daily for 5 days. Patient GFR is 99. Take nirmatrelvir (150 mg) two tablets twice daily for 5 days and ritonavir (100 mg) one tablet twice daily for 5 days. 30 tablet , FNP      PDMP not reviewed this encounter.   Trevor Iha, FNP 04/30/22 1459

## 2022-04-30 NOTE — Discharge Instructions (Addendum)
Advised patient we will follow-up in the next 2 to 3 hours with COVID-19 results advised patient to hold Paxlovid until we contact her.  Advised patient may take OTC Tylenol 1000 to 4000 mg daily, as needed for fever.  Advised if symptoms worsen and/or unresolved please go to [facility] ED for further evaluation of her current symptoms.

## 2022-05-05 DIAGNOSIS — H2513 Age-related nuclear cataract, bilateral: Secondary | ICD-10-CM | POA: Diagnosis not present

## 2022-05-11 DIAGNOSIS — M545 Low back pain, unspecified: Secondary | ICD-10-CM | POA: Diagnosis not present

## 2022-05-20 DIAGNOSIS — M545 Low back pain, unspecified: Secondary | ICD-10-CM | POA: Diagnosis not present

## 2022-05-20 DIAGNOSIS — M5412 Radiculopathy, cervical region: Secondary | ICD-10-CM | POA: Diagnosis not present

## 2022-05-23 ENCOUNTER — Other Ambulatory Visit: Payer: Self-pay | Admitting: Family Medicine

## 2022-05-25 ENCOUNTER — Other Ambulatory Visit: Payer: Self-pay

## 2022-05-26 ENCOUNTER — Encounter: Payer: Self-pay | Admitting: Psychology

## 2022-05-26 ENCOUNTER — Ambulatory Visit: Payer: Medicare Other

## 2022-05-26 ENCOUNTER — Ambulatory Visit (INDEPENDENT_AMBULATORY_CARE_PROVIDER_SITE_OTHER): Payer: Medicare Other | Admitting: Psychology

## 2022-05-26 DIAGNOSIS — F331 Major depressive disorder, recurrent, moderate: Secondary | ICD-10-CM | POA: Diagnosis not present

## 2022-05-26 DIAGNOSIS — F329 Major depressive disorder, single episode, unspecified: Secondary | ICD-10-CM | POA: Insufficient documentation

## 2022-05-26 DIAGNOSIS — M5136 Other intervertebral disc degeneration, lumbar region: Secondary | ICD-10-CM | POA: Diagnosis not present

## 2022-05-26 DIAGNOSIS — F411 Generalized anxiety disorder: Secondary | ICD-10-CM | POA: Diagnosis not present

## 2022-05-26 DIAGNOSIS — M5416 Radiculopathy, lumbar region: Secondary | ICD-10-CM | POA: Diagnosis not present

## 2022-05-26 DIAGNOSIS — R4189 Other symptoms and signs involving cognitive functions and awareness: Secondary | ICD-10-CM

## 2022-05-26 DIAGNOSIS — F1020 Alcohol dependence, uncomplicated: Secondary | ICD-10-CM | POA: Insufficient documentation

## 2022-05-26 DIAGNOSIS — M5412 Radiculopathy, cervical region: Secondary | ICD-10-CM | POA: Diagnosis not present

## 2022-05-26 DIAGNOSIS — G4733 Obstructive sleep apnea (adult) (pediatric): Secondary | ICD-10-CM | POA: Insufficient documentation

## 2022-05-26 NOTE — Progress Notes (Signed)
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 were noted. Her affect was generally relaxed and positive, but did range appropriately given the subject being discussed during the clinical interview. Spontaneous speech was fluent and word finding difficulties were not observed during the clinical interview. Thought processes were coherent, organized, and normal in content. Insight into her cognitive difficulties appeared adequate.   During testing, engagement was variable. Ms. Koehler commonly perseverated on fatigue and reported poor sleep during the previous night(s). She also described hunger and discomfort while seated throughout testing. At one point, she abruptly stopped testing in the midst of a verbal memory task to request a snack from the psychometrist. She attempted to discontinue the evaluation at several time points during testing, particularly throughout memory testing. She was also noted in stating that she simply was "not paying attention" during memory tasks. She was noted to be  impulsive at times and often started tests before the psychometrist had completed instructions. Sustained attention was otherwise adequate. Overall, Ms. Fineberg was cooperative with the clinical interview and generally cooperative with subsequent testing procedures when provided encouragement to persist by the psychometrist.   Adequacy of Effort: The validity of neuropsychological testing is limited by the extent to which the individual being tested may be assumed to have exerted adequate effort during testing. Ms. Dubroc expressed her intention to perform to the best of her abilities and exhibited adequate task engagement and persistence. Scores across stand-alone and embedded performance validity measures were within expectation. However, given behavioral observations during testing (see above), it is prudent to interpret test scores with a mild degree of caution.  Test Results: Ms. Ancona was fully oriented at the time of the current evaluation.  Intellectual abilities based upon educational and vocational attainment were estimated to be in the average range. Premorbid abilities were estimated to be within the above average range based upon a single-word reading test.   Processing speed was average. Basic attention was average. More complex attention (e.g., working memory) was also average. Executive functioning was mildly variable but overall adequate, ranging from the below average to above average normative ranges. She performed in the exceptionally high range across a task assessing safety and judgment.  While not directly assessed, receptive  language abilities were believed to be intact. Likewise, Ms. Turan did not exhibit any difficulties comprehending task instructions and answered all questions asked of her appropriately. Assessed expressive language was variable. Phonemic fluency was well below average to below average, semantic fluency was average, and confrontation naming was average.      Assessed visuospatial/visuoconstructional abilities were average to above average.    Learning (i.e., encoding) of novel verbal and visual information was variable, ranging from the well below average to average normative ranges. Spontaneous delayed recall (i.e., retrieval) of previously learned information was also variable, ranging from the exceptionally low to average normative ranges. Retention rates were 93% across a story learning task, 0% across a list learning task, and 83% across a shape learning task. Performance across recognition tasks was well below average across a daily living task but average across two other tasks, suggesting evidence for information consolidation.   Results of emotional screening instruments suggested that recent symptoms of generalized anxiety were in the moderate range, while symptoms of depression were also within the moderate range. A screening instrument assessing recent sleep quality suggested the presence of moderate sleep dysfunction.  Tables of Scores:   Note: This summary of test scores accompanies the interpretive report and should not be considered in isolation without reference to the appropriate sections in the text. Descriptors are based on appropriate normative data and may be adjusted based on clinical judgment. Terms such as "Within Normal Limits" and "Outside Normal Limits" are used when a more specific description of the test score cannot be determined.       Percentile - Normative Descriptor > 98 - Exceptionally High 91-97 - Well Above Average 75-90 - Above Average 25-74 - Average 9-24 - Below Average 2-8 - Well Below Average < 2 - Exceptionally Low       Validity:   DESCRIPTOR       ACS Word Choice: --- --- Within Normal Limits  Dot Counting Test: --- --- Within Normal Limits  NAB EVI: --- --- Within Normal Limits       Orientation:      Raw Score Percentile   NAB Orientation, Form 1 29/29 --- ---       Cognitive Screening:       Raw Score Percentile   SLUMS: 20/30 --- ---       Intellectual Functioning:      Standard Score Percentile   Test of Premorbid Functioning: 119 90 Above Average       Memory:     NAB Memory Module, Form 1: Standard Score/ T Score Percentile   Total Memory Index 70 2 Well Below Average  List Learning       Total Trials 1-3 19/36 (37) 9 Below Average    List B 1/12 (23) <1 Exceptionally Low    Short Delay Free Recall 6/12 (39) 14 Below Average    Long Delay Free Recall 0/12 (19) <1 Exceptionally Low    Retention Percentage 0 (15) <1 Exceptionally Low    Recognition Discriminability 9 (54) 66 Average  Shape Learning       Total Trials 1-3 16/27 (49) 46 Average    Delayed Recall 5/9 (43) 25 Average    Retention Percentage 83 (45) 31 Average    Recognition Discriminability 6 (47) 38 Average  Story Learning       Immediate Recall 45/80 (30) 2 Well Below Average    Delayed Recall 26/40 (35) 7 Well Below Average    Retention Percentage 93 (  52) 58 Average  Daily Living Memory       Immediate Recall 37/51 (36) 8 Well Below Average    Delayed Recall 9/17 (26) 1 Exceptionally Low    Retention Percentage 60 (31) 3 Well Below Average    Recognition Hits 7/10 (36) 8 Well Below Average       Attention/Executive Function:     Trail Making Test (TMT): Raw Score (T Score) Percentile     Part A 25 secs.,  0 errors (55) 69 Average    Part B 68 secs.,  2 errors (51) 54 Average         Scaled Score Percentile   WAIS-IV Coding: 11 63 Average       NAB Attention Module, Form 1: T Score Percentile     Digits Forward 50 50 Average    Digits Backwards 50 50 Average        Scaled Score Percentile   WAIS-IV Similarities: 10 50 Average       D-KEFS Color-Word Interference Test: Raw Score (Scaled Score) Percentile     Color Naming 34 secs. (9) 37 Average    Word Reading 23 secs. (11) 63 Average    Inhibition 56 secs. (12) 75 Above Average      Total Errors 1 error (11) 63 Average     Inhibition/Switching 62 secs. (12) 75 Above Average      Total Errors 2 errors (11) 63 Average       D-KEFS Verbal Fluency Test: Raw Score (Scaled Score) Percentile     Letter Total Correct 25 (7) 16 Below Average    Category Total Correct 37 (11) 63 Average    Category Switching Total Correct 9 (6) 9 Below Average    Category Switching Accuracy 7 (6) 9 Below Average      Total Set Loss Errors 0 (13) 84 Above Average      Total Repetition Errors 5 (8) 25 Average       NAB Executive Functions Module, Form 1: T Score Percentile     Judgment 72 99 Exceptionally High       Language:     Verbal Fluency Test: Raw Score (T Score) Percentile     Phonemic Fluency (FAS) 25 (32) 4 Well Below Average    Animal Fluency 21 (53) 62 Average        NAB Language Module, Form 1: T Score Percentile     Naming 31/31 (56) 73 Average       Visuospatial/Visuoconstruction:      Raw Score Percentile   Clock Drawing: 10/10 --- Within Normal Limits       NAB Spatial Module, Form 1: T Score Percentile     Figure Drawing Copy 60 84 Above Average        Scaled Score Percentile   WAIS-IV Block Design: 9 37 Average       Mood and Personality:      Raw Score Percentile   Beck Depression Inventory - II: 24 --- Moderate  PROMIS Anxiety Questionnaire: 26 --- Moderate       Additional Questionnaires:      Raw Score Percentile   PROMIS Sleep Disturbance Questionnaire: 30 --- Moderate   Informed Consent and Coding/Compliance:   The current evaluation represents a clinical evaluation for the purposes previously outlined by the referral source and is in no way reflective of a forensic evaluation.   Ms. Stoen was provided with a verbal description of the nature and purpose of the  present neuropsychological evaluation. Also reviewed were the foreseeable risks and/or discomforts and benefits of the procedure, limits of confidentiality, and mandatory reporting requirements of this provider. The patient was given the  opportunity to ask questions and receive answers about the evaluation. Oral consent to participate was provided by the patient.   This evaluation was conducted by Newman Nickels, Ph.D., ABPP-CN, board certified clinical neuropsychologist. Ms. Reeg completed a clinical interview with Dr. Milbert Coulter, billed as one unit [phone], and 145 minutes of cognitive testing and scoring, billed as one unit [phone] and four additional units [id number]. Psychometrist Shan Levans, B.S., assisted Dr. Milbert Coulter with test administration and scoring procedures. As a separate and discrete service, Dr. Milbert Coulter spent a total of 160 minutes in interpretation and report writing billed as one unit [phone] and two units [id number].

## 2022-05-26 NOTE — Progress Notes (Signed)
   Psychometrician Note   Cognitive testing was administered to Lindsay [name] by Cruzita Lederer, B.S. (psychometrist) under the supervision of Dr. Christia Reading, Ph.D., licensed psychologist on 05/26/2022. Lindsay [name] did not appear overtly distressed by the testing session per behavioral observation or responses across self-report questionnaires. Rest breaks were offered.    The battery of tests administered was selected by Dr. Christia Reading, Ph.D. with consideration to Lindsay [name]'s current level of functioning, the nature of her symptoms, emotional and behavioral responses during interview, level of literacy, observed level of motivation/effort, and the nature of the referral question. This battery was communicated to the psychometrist. Communication between Dr. Christia Reading, Ph.D. and the psychometrist was ongoing throughout the evaluation and Dr. Christia Reading, Ph.D. was immediately accessible at all times. Dr. Christia Reading, Ph.D. provided supervision to the psychometrist on the date of this service to the extent necessary to assure the quality of all services provided.    Lindsay [name] will return within approximately 1-2 weeks for an interactive feedback session with Dr. Melvyn Novas at which time her test performances, clinical impressions, and treatment recommendations will be reviewed in detail. Lindsay [name] understands she can contact our office should she require our assistance before this time.  A total of 145 minutes of billable time were spent face-to-face with Lindsay [name] by the psychometrist. This includes both test administration and scoring time. Billing for these services is reflected in the clinical report generated by Dr. Christia Reading, Ph.D.  This note reflects time spent with the psychometrician and does not include test scores or any clinical interpretations made by Dr. Melvyn Novas. The full report will follow in a separate note.

## 2022-06-02 ENCOUNTER — Ambulatory Visit (INDEPENDENT_AMBULATORY_CARE_PROVIDER_SITE_OTHER): Payer: Medicare Other | Admitting: Psychology

## 2022-06-02 DIAGNOSIS — F411 Generalized anxiety disorder: Secondary | ICD-10-CM

## 2022-06-02 DIAGNOSIS — R4189 Other symptoms and signs involving cognitive functions and awareness: Secondary | ICD-10-CM | POA: Diagnosis not present

## 2022-06-02 DIAGNOSIS — F1020 Alcohol dependence, uncomplicated: Secondary | ICD-10-CM | POA: Diagnosis not present

## 2022-06-02 DIAGNOSIS — F331 Major depressive disorder, recurrent, moderate: Secondary | ICD-10-CM | POA: Diagnosis not present

## 2022-06-02 NOTE — Progress Notes (Signed)
   Neuropsychology Feedback Session Lindsay [name]. [facility] [place] Department of Neurology  Reason for Referral:   Lindsay [name] is a 66 y.o. right-handed Caucasian female referred by Marlowe Kays, PA-C, to characterize her current cognitive functioning and assist with diagnostic clarity and treatment planning in the context of subjective cognitive decline, severe psychiatric distress, and chronic alcohol abuse.   Feedback:   [name] completed a comprehensive neuropsychological evaluation on 05/26/2022. Please refer to that encounter for the full report and recommendations. Briefly, the most likely culprit for weakness across testing and reported dysfunction in her day-to-day life is a combination of significant psychiatric distress (i.e., bipolar disorder, anxiety, depression) and severe alcohol abuse and dependence. Ongoing sleep dysfunction likely exacerbates deficits stemming from these two factors. All of what [name] described during interview surrounding subjective decline would be reasonably seen within these causes. Her testing patterns also align well with what could be expected given her medical and psychiatric history. There are no current concerns for an alcohol-related dementia presentation or Korsakoff's syndrome (recent thiamine levels were actually mildly elevated rather than deficient). However, continued alcohol abuse/dependence will certainly increase her risk for this in the future. Current testing is not suggestive of Alzheimer's disease, Lewy body disease, frontotemporal dementia, or another neurodegenerative condition presently.   [name] was accompanied by her close friend [name] during the current feedback session. Content of the current session focused on the results of her neuropsychological evaluation. [name] was given the opportunity to ask questions and her questions were answered. She was encouraged to reach out should additional questions arise. A copy of her  report was provided at the conclusion of the visit.      25 minutes were spent conducting the current feedback session with Ms. Para, billed as one unit [phone].

## 2022-06-10 ENCOUNTER — Ambulatory Visit
Admission: EM | Admit: 2022-06-10 | Discharge: 2022-06-10 | Disposition: A | Discharge status: Home / Self Care | Payer: Medicare Other | Attending: Family Medicine | Admitting: Family Medicine

## 2022-06-10 DIAGNOSIS — M25461 Effusion, right knee: Secondary | ICD-10-CM | POA: Diagnosis not present

## 2022-06-10 DIAGNOSIS — R051 Acute cough: Secondary | ICD-10-CM | POA: Diagnosis not present

## 2022-06-10 DIAGNOSIS — R079 Chest pain, unspecified: Secondary | ICD-10-CM

## 2022-06-10 DIAGNOSIS — M25561 Pain in right knee: Secondary | ICD-10-CM | POA: Diagnosis not present

## 2022-06-10 DIAGNOSIS — J1189 Influenza due to unidentified influenza virus with other manifestations: Secondary | ICD-10-CM | POA: Diagnosis not present

## 2022-06-10 MED ORDER — KETOROLAC TROMETHAMINE 30 MG/ML IJ SOLN
30.0000 mg | Freq: Once | INTRAMUSCULAR | Status: AC
  Administered 2022-06-10: 30 mg via INTRAMUSCULAR

## 2022-06-10 MED ORDER — PREDNISONE 20 MG PO TABS: 20.0000 mg | ORAL_TABLET | Freq: Two times a day (BID) | ORAL | 0 refills | Status: DC

## 2022-06-10 NOTE — ED Triage Notes (Addendum)
Pt c/o RT hip and knee pain x 2 weeks.. Denies injury. Recently had hip injection. Pain is radiating from hip to knee. Ice/heat and voltaren gel prn. Takes oxycodone-acetaminophen prn.   Also states she has been having intermittent chest pain recently. Has had to take her nitro 3x in last week. Episodes only last a couple mins at a time.

## 2022-06-10 NOTE — ED Provider Notes (Addendum)
[name]    CSN: [id number] Arrival date & time: 06/10/22  1731      History   Chief Complaint Chief Complaint  Patient presents with   Knee Pain    RT   Hip Pain    RT    HPI Lindsay [name] is a 66 y.o. female.   HPI   Comes in in a wheelchair.  States she is having severe right knee pain.  Its radiating and up to her right hip and low back.  Has a long history of orthopedic problems and degenerative problems in her hip and back.  She states her right knee has been swollen and painful a couple days.  He is tearful and upset about her swollen knee.  States she will not be able to work her shift tomorrow because of the pain.  Has been taking hydrocodone given to her from her her PCP.  Takes a half of a 5 mg pill when the pain is severe. States that she has not been able to sleep.  She is having a lot of pain.  She has had chest pain several times in the last couple of days.  It is fleeting.  No associated symptoms.  No radiation.  She has taken nitro 3 times in the last 2 days.  She does have a history of coronary artery disease.  She has had a STEMI.  She does not feel like this is cardiac chest pain  Past Medical History:  Diagnosis Date   Abnormal mammogram    Thick Tissue   Acquired trigger finger of right ring finger 04/20/2022   Arthritis    Bilateral wrist pain 04/20/2022   Bipolar 1 disorder    Cataract    OU   Cervical radiculopathy 10/14/2021   Chalazion of left lower eyelid 04/15/2022   Chest pain 02/11/2015   Chronic atrial fibrillation 09/08/2015   Colon polyp    Compressed cervical disc    Coronary artery disease involving native coronary artery with angina pectoris 03/24/2015   Dorsalgia 09/22/2018   Dysphagia 09/22/2018   Essential hypertension 03/03/2015   Generalized anxiety disorder    GERD without esophagitis 09/22/2018   Hematemesis 12/18/2019   Hepatic steatosis 04/22/2022   History of appendectomy 1963   Hypertensive retinopathy     OU   Irritable bowel syndrome without diarrhea 09/22/2018   Lactic acidosis 12/18/2019   LLQ pain 04/15/2022   Major depressive disorder    Migraine without status migrainosus, not intractable 09/22/2018   Mixed hyperlipidemia 05/07/2016   Non-ST elevation MI (NSTEMI) 07/26/2008   No cardiac catheterization at the time.   Obstructive sleep apnea    no CPAP use   Orthostasis    Osteoarthritis of carpometacarpal (CMC) joint of thumb 04/20/2022   Osteoarthritis of knee 09/22/2018   PAF (paroxysmal atrial fibrillation) 10/09/2018   Pain in joint of right hip 07/03/2020   Pain in right knee 12/14/2018   Recurrent syncope    Rib pain 07/03/2020   Sciatica of right side 09/22/2018   Severe alcohol use disorder    Vitamin D deficiency     Patient Active Problem List   Diagnosis Date Noted   Lumbar radiculopathy 05/26/2022   Degeneration of lumbar intervertebral disc 05/26/2022   Major depressive disorder    Severe alcohol use disorder    Generalized anxiety disorder    Obstructive sleep apnea    Hepatic steatosis 04/22/2022   Osteoarthritis of carpometacarpal (CMC) joint of thumb 04/20/2022  Acquired trigger finger of right ring finger 04/20/2022   Bilateral wrist pain 04/20/2022   Chalazion of left lower eyelid 04/15/2022   LLQ pain 04/15/2022   Bipolar 1 disorder    Cervical radiculopathy 10/14/2021   Pain in joint of right hip 07/03/2020   Rib pain 07/03/2020   Recurrent syncope    Orthostasis    Lactic acidosis 12/18/2019   Hematemesis 12/18/2019   Pain in right knee 12/14/2018   PAF (paroxysmal atrial fibrillation) 10/09/2018   Dorsalgia 09/22/2018   Dysphagia 09/22/2018   GERD without esophagitis 09/22/2018   Irritable bowel syndrome without diarrhea 09/22/2018   Osteoarthritis of knee 09/22/2018   Polypharmacy 09/22/2018   Sciatica of right side 09/22/2018   Vitamin D deficiency 09/22/2018   Mixed hyperlipidemia 05/07/2016   Chronic atrial fibrillation  (HCC) 09/08/2015   Coronary artery disease involving native coronary artery with angina pectoris 03/24/2015   Essential hypertension 03/03/2015   Chest pain 02/11/2015   Non-ST elevation MI (NSTEMI) (HCC) 07/26/2008    Past Surgical History:  Procedure Laterality Date   APPENDECTOMY     CARDIAC CATHETERIZATION N/A 03/25/2015   Procedure: Left Heart Cath and Coronary Angiography;  Surgeon: Marykay Lex, MD;  Location: [facility] INVASIVE CV LAB;  Service: Cardiovascular;  Laterality: N/A;   CORONARY STENT INTERVENTION N/A 10/09/2018   Procedure: CORONARY STENT INTERVENTION;  Surgeon: Corky Crafts, MD;  Location: [facility] INVASIVE CV LAB;  Service: Cardiovascular;  Laterality: N/A;   DILATATION & CURETTAGE/HYSTEROSCOPY WITH MYOSURE N/A 05/19/2016   Procedure: DILATATION & CURETTAGE/HYSTEROSCOPY WITH MYOSURE;  Surgeon: Geryl Rankins, MD;  Location: [facility] ORS;  Service: Gynecology;  Laterality: N/A;  Possible Myosure for polyps.   EYE SURGERY     LEFT HEART CATH AND CORONARY ANGIOGRAPHY N/A 10/09/2018   Procedure: LEFT HEART CATH AND CORONARY ANGIOGRAPHY;  Surgeon: Corky Crafts, MD;  Location: [facility] INVASIVE CV LAB;  Service: Cardiovascular;  Laterality: N/A;   MOUTH SURGERY     URETHRAL DILATION      OB History   No obstetric history on file.      Home Medications    Prior to Admission medications   Medication Sig Start Date End Date Taking? Authorizing Provider  predniSONE (DELTASONE) 20 MG tablet Take 1 tablet (20 mg total) by mouth 2 (two) times daily with a meal. 06/10/22  Yes Eustace Moore, MD  acetaminophen (TYLENOL) 325 MG tablet Take 2 tablets (650 mg total) by mouth every 6 (six) hours as needed for mild pain (or Fever >/= 101). 12/19/19   Rodolph Bong, MD  amLODipine (NORVASC) 2.5 MG tablet Take 1 tablet (2.5 mg total) by mouth daily. 03/08/22   Ronney Asters, NP  azelastine (OPTIVAR) 0.05 % ophthalmic solution Place 2 drops into both eyes 2 (two) times daily.  04/29/22   Breeback, Jade L, PA-C  B Complex-C (SUPER B COMPLEX PO) Take by mouth.    [provider]  BIOTIN PO Take 1 tablet by mouth daily.    [provider]  buPROPion (WELLBUTRIN XL) 150 MG 24 hr tablet Take 1 tablet by mouth daily. 01/31/21   [provider]  Cholecalciferol (VITAMIN D3) 125 MCG (5000 UT) CAPS Take 5,000 Units by mouth daily.    [provider]  clopidogrel (PLAVIX) 75 MG tablet Take 1 tablet (75 mg total) by mouth daily. 11/17/21   Alver Sorrow, NP  Cyanocobalamin (B-12 PO) Take 1 tablet by mouth daily.    [provider]  diclofenac Sodium (VOLTAREN) 1 % GEL Apply topically as needed.    [provider]  esomeprazole (NEXIUM 24HR CLEAR MINIS) 20 MG capsule Take 1 capsule (20 mg total) by mouth daily before breakfast. 12/19/19   Rodolph Bong, MD  Evolocumab (REPATHA SURECLICK) 140 MG/ML SOAJ Inject 140 mg into the skin every 14 (fourteen) days. 03/31/22   Chilton Si, MD  gabapentin (NEURONTIN) 100 MG capsule Take 3 capsules by mouth at bedtime. 01/30/21   [provider]  losartan (COZAAR) 50 MG tablet TAKE ONE TABLET BY MOUTH ONE TIME DAILY 01/07/22   Chilton Si, MD  metoprolol tartrate (LOPRESSOR) 25 MG tablet TAKE ONE-HALF TABLET BY MOUTH TWICE A DAY . 01/07/22   Hilty, Lisette Abu, MD  nitroGLYCERIN (NITROSTAT) 0.4 MG SL tablet Place 1 tablet (0.4 mg total) under the tongue as needed for chest pain. 11/17/21   Alver Sorrow, NP  Omega-3 Fatty Acids (OMEGA 3 PO) Take 1 capsule by mouth daily.    [provider]  Plant Sterols and Stanols (CHOLESTOFF PLUS PO) Take by mouth daily.    [provider]  Probiotic Product (PROBIOTIC ADVANCED PO) Take by mouth daily.    [provider]  traZODone (DESYREL) 50 MG tablet Take 25 mg by mouth at bedtime.    [provider]    Family History Family History  Problem Relation Age of Onset   Vascular Disease Mother     Dementia Mother    Hypertension Mother    Stroke Mother    Depression Mother    Dementia Father    Heart attack Father    Stroke Father    Heart attack Maternal Grandmother    Heart attack Maternal Grandfather    Kidney failure Paternal Grandmother    Heart attack Paternal Grandfather     Social History Social History   Tobacco Use   Smoking status: Never   Smokeless tobacco: Never  Vaping Use   Vaping Use: Never used  Substance Use Topics   Alcohol use: Not Currently    Comment: hx of severe alcohol abuse; most recent admission 02/2022   Drug use: Never     Allergies   Sulfa antibiotics, Ciprofloxacin, Depakote [divalproex sodium], Pine, Risperidone, Seroquel [quetiapine fumarate], Statins, Tramadol, Citalopram, Lamotrigine, Other, Topiramate, Wasp venom, and Zetia [ezetimibe]   Review of Systems Review of Systems See HPI  Physical Exam Triage Vital Signs ED Triage Vitals  Enc Vitals Group     BP 06/10/22 1745 (!) 88/60     Pulse Rate 06/10/22 1745 88     Resp 06/10/22 1745 18     Temp 06/10/22 1745 97.8 F (36.6 C)     Temp Source 06/10/22 1745 Oral     SpO2 06/10/22 1745 96 %     Weight --      Height --      Head Circumference --      Peak Flow --      Pain Score 06/10/22 1747 10     Pain Loc --      Pain Edu? --      Excl. in GC? --    No data found.  Updated Vital Signs BP 114/75 (BP Location: Left Arm)   Pulse 88   Temp 97.8 F (36.6 C) (Oral)   Resp 18   SpO2 96%       Physical Exam Constitutional:      General: She is not in acute distress.  Appearance: She is well-developed.     Comments: Overweight.  She is in a wheelchair secondary to knee pain.  Little tearful  HENT:     Head: Normocephalic and atraumatic.  Eyes:     Conjunctiva/sclera: Conjunctivae normal.     Pupils: Pupils are equal, round, and reactive to light.  Cardiovascular:     Rate and Rhythm: Normal rate and regular rhythm.     Heart sounds: Normal heart sounds.   Pulmonary:     Effort: Pulmonary effort is normal. No respiratory distress.     Breath sounds: Normal breath sounds.  Abdominal:     General: There is no distension.     Palpations: Abdomen is soft.  Musculoskeletal:        General: Tenderness present. No swelling or deformity. Normal range of motion.     Cervical back: Normal range of motion.     Comments: Tenderness medial joint line.  Limited flexion secondary to pain.  No instability.  Examination done in wheelchair  Skin:    General: Skin is warm and dry.  Neurological:     Mental Status: She is alert.      UC Treatments / Results  Labs (all labs ordered are listed, but only abnormal results are displayed) Labs Reviewed - No data to display  EKG   Radiology No results found.  Procedures ED EKG  Date/Time: 06/10/2022 8:08 PM  Performed by: Eustace Moore, MD Authorized by: Eustace Moore, MD   ECG reviewed by ED Physician in the absence of a cardiologist: yes   Previous ECG:    Previous ECG:  Compared to current   Similarity:  Changes noted   Comparison ECG info:  Bundle branch block absent.  LVH present Interpretation:    Interpretation: abnormal   Rate:    ECG rate:  89 Rhythm:    Rhythm: sinus rhythm   Ectopy:    Ectopy: none   QRS:    QRS axis:  Left   QRS intervals:  Normal   QRS conduction: normal   ST segments:    ST segments:  Normal T waves:    T waves: normal   Q waves:    Abnormal Q-waves: not present   Other findings:    Other findings: LVH    (including critical care time)  Medications Ordered in UC Medications  ketorolac (TORADOL) 30 MG/ML injection 30 mg (30 mg Intramuscular Given 06/10/22 1846)    Initial Impression / Assessment and Plan / UC Course  I have reviewed the triage vital signs and the nursing notes.  Pertinent labs & imaging results that were available during my care of the patient were reviewed by me and considered in my medical decision making (see  chart for details).     Patient is a recovering alcoholic.  I do not feel comfortable giving her any other pain medication.  She still has a few of the hydrocodone.  She states she cannot take a lot of anti-inflammatories because of her stomach disease.  We will give her a shot of Toradol followed by prednisone for 3 days.  Follow-up with PCP Final Clinical Impressions(s) / UC Diagnoses   Final diagnoses:  Acute pain of right knee  Effusion of right knee  Chest pain, unspecified type     Discharge Instructions      The Toradol shot will help with the pain now Take the prednisone daily for the next 3 days Call your doctor if not improving  by Monday     ED Prescriptions     Medication Sig Dispense Auth. Provider   predniSONE (DELTASONE) 20 MG tablet Take 1 tablet (20 mg total) by mouth 2 (two) times daily with a meal. 6 tablet Eustace MooreNelson, Stephfon Bovey Sue, MD      PDMP not reviewed this encounter.   Eustace MooreNelson, Chariah Bailey Sue, MD 06/10/22 1901    Eustace MooreNelson, Annalycia Done Sue, MD 06/10/22 2009

## 2022-06-10 NOTE — Discharge Instructions (Signed)
The Toradol shot will help with the pain now Take the prednisone daily for the next 3 days Call your doctor if not improving by Monday

## 2022-06-15 DIAGNOSIS — M545 Low back pain, unspecified: Secondary | ICD-10-CM | POA: Diagnosis not present

## 2022-06-16 ENCOUNTER — Ambulatory Visit: Payer: Medicare Other | Admitting: Physician Assistant

## 2022-06-22 DIAGNOSIS — M5412 Radiculopathy, cervical region: Secondary | ICD-10-CM | POA: Diagnosis not present

## 2022-06-22 DIAGNOSIS — M545 Low back pain, unspecified: Secondary | ICD-10-CM | POA: Diagnosis not present

## 2022-06-22 DIAGNOSIS — M5416 Radiculopathy, lumbar region: Secondary | ICD-10-CM | POA: Diagnosis not present

## 2022-06-22 DIAGNOSIS — M5136 Other intervertebral disc degeneration, lumbar region: Secondary | ICD-10-CM | POA: Diagnosis not present

## 2022-06-22 DIAGNOSIS — M25561 Pain in right knee: Secondary | ICD-10-CM | POA: Diagnosis not present

## 2022-06-23 DIAGNOSIS — G459 Transient cerebral ischemic attack, unspecified: Secondary | ICD-10-CM | POA: Diagnosis not present

## 2022-06-23 DIAGNOSIS — R4781 Slurred speech: Secondary | ICD-10-CM | POA: Diagnosis not present

## 2022-06-29 ENCOUNTER — Ambulatory Visit (INDEPENDENT_AMBULATORY_CARE_PROVIDER_SITE_OTHER): Payer: Medicare Other | Admitting: Family Medicine

## 2022-06-29 VITALS — BP 120/77 | HR 79 | Ht 62.0 in | Wt 181.0 lb

## 2022-06-29 DIAGNOSIS — F1020 Alcohol dependence, uncomplicated: Secondary | ICD-10-CM

## 2022-06-29 DIAGNOSIS — F331 Major depressive disorder, recurrent, moderate: Secondary | ICD-10-CM

## 2022-06-29 MED ORDER — BUPROPION HCL ER (XL) 150 MG PO TB24: 150.0000 mg | ORAL_TABLET | Freq: Every day | ORAL | 4 refills | Status: DC

## 2022-06-29 NOTE — Assessment & Plan Note (Signed)
-   PHQ9 elevated to 21 today. Pt says wellbutrin worked very well for her depression in the past and she is wanting to go back on this. Have sent in a refill of her wellbutrin and gave her strict precautions of not drinking alcohol. - pt denies suicidal and homicidal ideation

## 2022-06-29 NOTE — Patient Instructions (Signed)
Resources:  [email or web address] for resources to apply for

## 2022-06-29 NOTE — Progress Notes (Signed)
Established patient visit   Patient: Lindsay [name]   DOB: 18-Feb-1956   66 y.o. Female  MRN: [id number] Visit Date: 06/29/2022  Today's healthcare provider: Owens Loffler, DO   Chief Complaint  Patient presents with   Follow-up    SUBJECTIVE    Chief Complaint  Patient presents with   Follow-up   HPI  Pt presents for weight check. Since last visit she was hospitalized for acute alcohol intoxication. This has been very upsetting for her. She was also experiencing suicidal ideation at the time. She has since started going back to [place] she is doing better mentally. She is still at her job at [employer] and has been doing well with this. She says her house is a mess and she is tearful when talking about the issues she has had with house.   She has a pmh of MDD and is wanting to go back on her wellbutrin. She says when she was on this medication it worked very well for her. She denies homicidal/suicidal ideation today.  Review of Systems  Constitutional:  Negative for activity change, fatigue and fever.  Respiratory:  Negative for cough and shortness of breath.   Cardiovascular:  Negative for chest pain.  Gastrointestinal:  Negative for abdominal pain.  Genitourinary:  Negative for difficulty urinating.  Psychiatric/Behavioral:         Tearful on exam       Current Meds  Medication Sig   acetaminophen (TYLENOL) 325 MG tablet Take 2 tablets (650 mg total) by mouth every 6 (six) hours as needed for mild pain (or Fever >/= 101).   amLODipine (NORVASC) 2.5 MG tablet Take 1 tablet (2.5 mg total) by mouth daily.   azelastine (OPTIVAR) 0.05 % ophthalmic solution Place 2 drops into both eyes 2 (two) times daily.   B Complex-C (SUPER B COMPLEX PO) Take by mouth.   BIOTIN PO Take 1 tablet by mouth daily.   Cholecalciferol (VITAMIN D3) 125 MCG (5000 UT) CAPS Take 5,000 Units by mouth daily.   clopidogrel (PLAVIX) 75 MG tablet Take 1 tablet (75 mg total) by mouth daily.   Cyanocobalamin  (B-12 PO) Take 1 tablet by mouth daily.   diclofenac Sodium (VOLTAREN) 1 % GEL Apply topically as needed.   esomeprazole (NEXIUM 24HR CLEAR MINIS) 20 MG capsule Take 1 capsule (20 mg total) by mouth daily before breakfast.   Evolocumab (REPATHA SURECLICK) [id number] MG/ML SOAJ Inject 140 mg into the skin every 14 (fourteen) days.   gabapentin (NEURONTIN) 100 MG capsule Take 3 capsules by mouth at bedtime.   losartan (COZAAR) 50 MG tablet TAKE ONE TABLET BY MOUTH ONE TIME DAILY   metoprolol tartrate (LOPRESSOR) 25 MG tablet TAKE ONE-HALF TABLET BY MOUTH TWICE A DAY .   nitroGLYCERIN (NITROSTAT) 0.4 MG SL tablet Place 1 tablet (0.4 mg total) under the tongue as needed for chest pain.   Omega-3 Fatty Acids (OMEGA 3 PO) Take 1 capsule by mouth daily.   Plant Sterols and Stanols (CHOLESTOFF PLUS PO) Take by mouth daily.   predniSONE (DELTASONE) 20 MG tablet Take 1 tablet (20 mg total) by mouth 2 (two) times daily with a meal.   Probiotic Product (PROBIOTIC ADVANCED PO) Take by mouth daily.   traZODone (DESYREL) 50 MG tablet Take 25 mg by mouth at bedtime.   [DISCONTINUED] buPROPion (WELLBUTRIN XL) 150 MG 24 hr tablet Take 1 tablet by mouth daily.    OBJECTIVE    BP 120/77  Pulse 79   Ht 5\' 2"  (1.575 m)   Wt 181 lb (82.1 kg)   SpO2 99%   BMI 33.11 kg/m   Physical Exam Vitals and nursing note reviewed.  Constitutional:      General: She is not in acute distress.    Appearance: Normal appearance.  HENT:     Head: Normocephalic and atraumatic.     Right Ear: External ear normal.     Left Ear: External ear normal.     Nose: Nose normal.  Eyes:     Conjunctiva/sclera: Conjunctivae normal.  Cardiovascular:     Rate and Rhythm: Normal rate and regular rhythm.  Pulmonary:     Effort: Pulmonary effort is normal.     Breath sounds: Normal breath sounds.  Neurological:     General: No focal deficit present.     Mental Status: She is alert and oriented to person, place, and time.  Psychiatric:         Mood and Affect: Mood normal.        Behavior: Behavior normal.        Thought Content: Thought content normal.        Judgment: Judgment normal.          ASSESSMENT & PLAN    Problem List Items Addressed This Visit       Other   Major depressive disorder - Primary    - PHQ9 elevated to 21 today. Pt says wellbutrin worked very well for her depression in the past and she is wanting to go back on this. Have sent in a refill of her wellbutrin and gave her strict precautions of not drinking alcohol. - pt denies suicidal and homicidal ideation       Relevant Medications   buPROPion (WELLBUTRIN XL) 150 MG 24 hr tablet   Severe alcohol use disorder    - a great amount of time was spent with patient counseling on alcohol use and creating a plan for her. She is currently going to AA (but not everyday) and has a sponsor that she feels like has been helpful. We created a plan to try and have her go to AA daily  - I believe a lot of her alcohol use is related to her depression and feeling like a failure. I have provided pt with a website called for patient to look at for resources for how to help with the issues with her house to see if she qualifies       At the end of visit patient says she has not been able to make her PT appointments for her back pain. I have provided her with some at home back exercises to try. She is currently managed by orthopedics for this.   Return in about 4 months (around 10/29/2022).  For wellbutrin follow up.     Meds ordered this encounter  Medications   buPROPion (WELLBUTRIN XL) 150 MG 24 hr tablet    Sig: Take 1 tablet (150 mg total) by mouth daily.    Dispense:  30 tablet    Refill:  4    No orders of the defined types were placed in this encounter.    12/29/2022, DO  [facility] [phone] (phone) [phone] (fax)  [facility]

## 2022-06-29 NOTE — Assessment & Plan Note (Signed)
-   a great amount of time was spent with patient counseling on alcohol use and creating a plan for her. She is currently going to AA (but not everyday) and has a sponsor that she feels like has been helpful. We created a plan to try and have her go to AA daily  - I believe a lot of her alcohol use is related to her depression and feeling like a failure. I have provided pt with a website called http://harris-[employer].info/ for patient to look at for resources for how to help with the issues with her house to see if she qualifies

## 2022-07-01 ENCOUNTER — Encounter (HOSPITAL_BASED_OUTPATIENT_CLINIC_OR_DEPARTMENT_OTHER): Payer: Self-pay | Admitting: Cardiovascular Disease

## 2022-07-01 ENCOUNTER — Ambulatory Visit (INDEPENDENT_AMBULATORY_CARE_PROVIDER_SITE_OTHER): Payer: Medicare Other | Admitting: Cardiovascular Disease

## 2022-07-01 VITALS — BP 110/72 | HR 77 | Ht 62.0 in | Wt 183.9 lb

## 2022-07-01 DIAGNOSIS — I480 Paroxysmal atrial fibrillation: Secondary | ICD-10-CM

## 2022-07-01 DIAGNOSIS — I10 Essential (primary) hypertension: Secondary | ICD-10-CM | POA: Diagnosis not present

## 2022-07-01 DIAGNOSIS — I25119 Atherosclerotic heart disease of native coronary artery with unspecified angina pectoris: Secondary | ICD-10-CM | POA: Diagnosis not present

## 2022-07-01 NOTE — Assessment & Plan Note (Signed)
Blood pressure is well-controlled.  Continue amlodipine, losartan and metoprolol. We will refer her to the exercise and nutrition program at [facility].

## 2022-07-01 NOTE — Assessment & Plan Note (Signed)
S/p PCI.  She isn't having any angina.  Refer to exercise and PT.  Continue clopidogrel, metoprolol, and Repatha.  She will come back for fasting lipids and CMP, her LDL goal is less than 70.

## 2022-07-01 NOTE — Assessment & Plan Note (Signed)
Currently maintaining sinus rhythm.  Continue metoprolol.  She is not on anticoagulation due to history of GI bleeding.  She is also had several admissions with alcohol intoxication which increases her risk of recurrent bleeding on anticoagulation.

## 2022-07-01 NOTE — Patient Instructions (Addendum)
Medication Instructions:  Your physician recommends that you continue on your current medications as directed. Please refer to the Current Medication list given to you today.   *If you need a refill on your cardiac medications before your next appointment, please call your pharmacy*  Lab Work: FASTING LP/CMET SOON   If you have labs (blood work) drawn today and your tests are completely normal, you will receive your results only by: MyChart Message (if you have MyChart) OR A paper copy in the mail If you have any lab test that is abnormal or we need to change your treatment, we will call you to review the results.  Testing/Procedures: NONE  Follow-Up: At [facility], you and your health needs are our priority.  As part of our continuing mission to provide you with exceptional heart care, we have created designated Provider Care Teams.  These Care Teams include your primary Cardiologist (physician) and Advanced Practice Providers (APPs -  Physician Assistants and Nurse Practitioners) who all work together to provide you with the care you need, when you need it.  We recommend signing up for the patient portal called "MyChart".  Sign up information is provided on this After Visit Summary.  MyChart is used to connect with patients for Virtual Visits (Telemedicine).  Patients are able to view lab/test results, encounter notes, upcoming appointments, etc.  Non-urgent messages can be sent to your provider as well.   To learn more about what you can do with MyChart, go to [email or web address].    Your next appointment:   6 month(s)  The format for your next appointment:   In Person  Provider:   Gillian Shields, NP   DR [facility] IN 1 YEAR   Other Instructions SEARCH [place] RIGHT START, FIRST LINK OR SCAN BAR CODE

## 2022-07-01 NOTE — Progress Notes (Signed)
Cardiology Office Note   Date:  07/24/2022   ID:  Lamica, Mccart 1955-09-12, MRN [id number]  PCP:  Charlton Amor, DO  Cardiologist:   Chilton Si, MD   No chief complaint on file.   History of Present Illness: Lindsay [name] is a 66 y.o. female with hypertension, CAD s/p MI in 2010 and PCI 2020, paroxysmal atrial fibrillation, and depression who presents for follow up.  Lindsay [name] was evaluated in clinic on 03/24/15  with recurrent chest pain.  She was started on metoprolol and referred for cardiac catheterization.  She underwent cardiac catheterization on 02/2015 that revealed a 50% RCA lesion but otherwise minimal CAD. She stopped taking Imdur due to headaches.    Lindsay [name] had angina and was referred for cath 09/2018. She was found to have 75% and 90% D1 lesions.  She had successful PCI.  She saw Edd Fabian 12/2019 after an episode of dizziness and syncope.  She received IV fluids in the ED and was recommended to wear compression stockings. She saw Dr. Rennis Golden 10/2021 and had been offered Repatha due to cost and travel. She saw Gillian Shields 10/2021 and continued to report atypical chest pain and social stressors. Her symptoms were thought to be musculoskeletal. She was seen at [facility] 05/2022 brought in by EMS for SI and alcohol intoxication. She was hospitalized 12/2021 for a similar issue, she was also noted to be depressed and she was referred with behavior health and encouraged to start back with AA.  Today, the patient states that she has been feeling poorly recently. She has been very depressed and has been having a rough time, she has been off of her Wellbutrin for the past 4 months which she is planning on getting soon. She states she has knee and back pain which makes her struggle to keep her job as she has to stand for work. She has been going to orthopedics, and has missed two appointments to PT due to her ED admissions, she has been doing PT exercises at home. She went to the [facility]  [place] for 12 weeks last year which she states changed her life in a very positive way and she wanted to continue but couldn't due to monetary constraints. She has degenerative disk disease which she is unsure of her treatment options, she plans to have another appointment with her orthopedics doctor. She had lifted something too heavy which injured her back after she had gotten a shot treatment. She had severe swelling of her right leg after her injury. She has been able to get on Repatha and has been compliant since her last visit. She had to put down her 39 year old cat recently in August which has been tough on her mental state. She denies any palpitations, chest pain, shortness of breath, or peripheral edema. No lightheadedness, headaches, syncope, orthopnea, or PND.  Past Medical History:  Diagnosis Date   Abnormal mammogram    Thick Tissue   Acquired trigger finger of right ring finger 04/20/2022   Arthritis    Bilateral wrist pain 04/20/2022   Bipolar 1 disorder    Cataract    OU   Cervical radiculopathy 10/14/2021   Chalazion of left lower eyelid 04/15/2022   Chest pain 02/11/2015   Chronic atrial fibrillation 09/08/2015   Colon polyp    Compressed cervical disc    Coronary artery disease involving native coronary artery with angina pectoris 03/24/2015   Dorsalgia 09/22/2018   Dysphagia 09/22/2018  Essential hypertension 03/03/2015   Generalized anxiety disorder    GERD without esophagitis 09/22/2018   Hematemesis 12/18/2019   Hepatic steatosis 04/22/2022   History of appendectomy 1963   Hypertensive retinopathy    OU   Irritable bowel syndrome without diarrhea 09/22/2018   Lactic acidosis 12/18/2019   LLQ pain 04/15/2022   Major depressive disorder    Migraine without status migrainosus, not intractable 09/22/2018   Mixed hyperlipidemia 05/07/2016   Non-ST elevation MI (NSTEMI) 07/26/2008   No cardiac catheterization at the time.   Obstructive sleep apnea    no CPAP  use   Orthostasis    Osteoarthritis of carpometacarpal (CMC) joint of thumb 04/20/2022   Osteoarthritis of knee 09/22/2018   PAF (paroxysmal atrial fibrillation) 10/09/2018   Pain in joint of right hip 07/03/2020   Pain in right knee 12/14/2018   Recurrent syncope    Rib pain 07/03/2020   Sciatica of right side 09/22/2018   Severe alcohol use disorder    Vitamin D deficiency     Past Surgical History:  Procedure Laterality Date   APPENDECTOMY     CARDIAC CATHETERIZATION N/A 03/25/2015   Procedure: Left Heart Cath and Coronary Angiography;  Surgeon: Marykay Lex, MD;  Location: [facility] INVASIVE CV LAB;  Service: Cardiovascular;  Laterality: N/A;   CORONARY STENT INTERVENTION N/A 10/09/2018   Procedure: CORONARY STENT INTERVENTION;  Surgeon: Corky Crafts, MD;  Location: [facility] INVASIVE CV LAB;  Service: Cardiovascular;  Laterality: N/A;   DILATATION & CURETTAGE/HYSTEROSCOPY WITH MYOSURE N/A 05/19/2016   Procedure: DILATATION & CURETTAGE/HYSTEROSCOPY WITH MYOSURE;  Surgeon: Geryl Rankins, MD;  Location: [facility] ORS;  Service: Gynecology;  Laterality: N/A;  Possible Myosure for polyps.   EYE SURGERY     LEFT HEART CATH AND CORONARY ANGIOGRAPHY N/A 10/09/2018   Procedure: LEFT HEART CATH AND CORONARY ANGIOGRAPHY;  Surgeon: Corky Crafts, MD;  Location: [facility] INVASIVE CV LAB;  Service: Cardiovascular;  Laterality: N/A;   MOUTH SURGERY     URETHRAL DILATION       Current Outpatient Medications  Medication Sig Dispense Refill   acetaminophen (TYLENOL) 325 MG tablet Take 2 tablets (650 mg total) by mouth every 6 (six) hours as needed for mild pain (or Fever >/= 101).     amLODipine (NORVASC) 2.5 MG tablet Take 1 tablet (2.5 mg total) by mouth daily. 90 tablet 1   azelastine (OPTIVAR) 0.05 % ophthalmic solution Place 2 drops into both eyes 2 (two) times daily. 6 mL 5   B Complex-C (SUPER B COMPLEX PO) Take by mouth.     BIOTIN PO Take 1 tablet by mouth daily.     buPROPion (WELLBUTRIN XL)  150 MG 24 hr tablet Take 1 tablet (150 mg total) by mouth daily. 30 tablet 4   Cholecalciferol (VITAMIN D3) 125 MCG (5000 UT) CAPS Take 5,000 Units by mouth daily.     clopidogrel (PLAVIX) 75 MG tablet Take 1 tablet (75 mg total) by mouth daily. 90 tablet 3   Cyanocobalamin (B-12 PO) Take 1 tablet by mouth daily.     diclofenac Sodium (VOLTAREN) 1 % GEL Apply topically as needed.     esomeprazole (NEXIUM 24HR CLEAR MINIS) 20 MG capsule Take 1 capsule (20 mg total) by mouth daily before breakfast. 30 capsule 1   Evolocumab (REPATHA SURECLICK) 140 MG/ML SOAJ Inject 140 mg into the skin every 14 (fourteen) days. 6 mL 3   gabapentin (NEURONTIN) 100 MG capsule Take 3 capsules by mouth at bedtime.  losartan (COZAAR) 50 MG tablet TAKE ONE TABLET BY MOUTH ONE TIME DAILY 90 tablet 2   metoprolol tartrate (LOPRESSOR) 25 MG tablet TAKE ONE-HALF TABLET BY MOUTH TWICE A DAY . 90 tablet 3   nitroGLYCERIN (NITROSTAT) 0.4 MG SL tablet Place 1 tablet (0.4 mg total) under the tongue as needed for chest pain. 25 tablet 5   Omega-3 Fatty Acids (OMEGA 3 PO) Take 1 capsule by mouth daily.     Plant Sterols and Stanols (CHOLESTOFF PLUS PO) Take by mouth daily.     predniSONE (DELTASONE) 20 MG tablet Take 1 tablet (20 mg total) by mouth 2 (two) times daily with a meal. 6 tablet 0   Probiotic Product (PROBIOTIC ADVANCED PO) Take by mouth daily.     traZODone (DESYREL) 50 MG tablet Take 25 mg by mouth at bedtime.     No current facility-administered medications for this visit.    Allergies:   Sulfa antibiotics, Ciprofloxacin, Depakote [divalproex sodium], Pine, Risperidone, Seroquel [quetiapine fumarate], Statins, Tramadol, Citalopram, Lamotrigine, Other, Topiramate, Wasp venom, and Zetia [ezetimibe]    Social History:  The patient  reports that she has never smoked. She has never used smokeless tobacco. She reports that she does not currently use alcohol. She reports that she does not use drugs.   Family History:   The patient's family history includes Dementia in her father and mother; Depression in her mother; Heart attack in her father, maternal grandfather, maternal grandmother, and paternal grandfather; Hypertension in her mother; Kidney failure in her paternal grandmother; Stroke in her father and mother; Vascular Disease in her mother.    ROS:   Please see the history of present illness. (+)Knee pain (+)Back pain (+)Depression (+)Tiredness All other systems are reviewed and negative.    PHYSICAL EXAM: VS:  BP 110/72 (BP Location: Left Arm, Patient Position: Sitting, Cuff Size: Large)   Pulse 77   Ht 5\' 2"  (1.575 m)   Wt 183 lb 14.4 oz (83.4 kg)   SpO2 99%   BMI 33.64 kg/m  , BMI Body mass index is 33.64 kg/m. GENERAL:  Well appearing HEENT: Pupils equal round and reactive, fundi not visualized, oral mucosa unremarkable NECK:  No jugular venous distention, waveform within normal limits, carotid upstroke brisk and symmetric, no bruits LUNGS:  Clear to auscultation bilaterally HEART:  RRR.  PMI not displaced or sustained,S1 and S2 within normal limits, no S3, no S4, no clicks, no rubs, no murmurs ABD:  Flat, positive bowel sounds normal in frequency in pitch, no bruits, no rebound, no guarding, no midline pulsatile mass, no hepatomegaly, no splenomegaly EXT:  2 plus pulses throughout, no edema, no cyanosis no clubbing SKIN:  No rashes no nodules NEURO:  Cranial nerves II through XII grossly intact, motor grossly intact throughout PSYCH:  Cognitively intact, oriented to person place and time  EKG:  The EKG is personally reviewed 07/01/2022: The EKG is not ordered. 04/06/2021: Sinus rhythm. Rate 78 bpm. LBBB. 04/11/2020: EKG was not ordered. 09/22/2018: Sinus rhythm.  Rate 62 bpm.  Diffuse TWI. 03/24/2015: sinus rhythm at 87 bpm.  Anterolateral TWI concerning for ischemia.    Echo 12/18/2019:  1. Left ventricular ejection fraction, by estimation, is 50%. The left  ventricle has mildly  decreased function. The left ventricle demonstrates  regional wall motion abnormalities, septal-lateral dyssynchrony consistent  with LBBB. Left ventricular  diastolic parameters are consistent with Grade I diastolic dysfunction  (impaired relaxation).   2. Right ventricular systolic function is normal. The right  ventricular  size is normal. Tricuspid regurgitation signal is inadequate for assessing  PA pressure.   3. The mitral valve is normal in structure. No evidence of mitral valve  regurgitation. No evidence of mitral stenosis.   4. The aortic valve is tricuspid. Aortic valve regurgitation is not  visualized. Mild aortic valve sclerosis is present, with no evidence of  aortic valve stenosis.   5. The inferior vena cava is normal in size with <50% respiratory  variability, suggesting right atrial pressure of 8 mmHg.   LHC 10/09/18: Ost 2nd Diag to 2nd Diag lesion is 30% stenosed. Prox RCA lesion is 20% stenosed. Dist RCA lesion is 50% stenosed. Ost 1st Diag lesion is 90% stenosed. Prox Cx lesion is 10% stenosed. 1st Diag lesion is 75% stenosed. A drug-eluting stent was successfully placed using a STENT SYNERGY DES 2.25X16. A drug-eluting stent was successfully placed using a STENT SYNERGY DES 2.25X8 for distal edge dissection. Post intervention, there is a 0% residual stenosis. The left ventricular systolic function is normal. LV end diastolic pressure is normal. The left ventricular ejection fraction is 55-65% by visual estimate. There is no aortic valve stenosis.   DAPT for 6 months along with aggressive secondary prevention.    Cardiac catheterization 03/25/15: Prox RCA lesion, 20% stenosed. Dist RCA lesion, 50% stenosed. Ost 2nd Diag to 2nd Diag lesion, 30% stenosed. The left ventricular systolic function is normal.  TTE 12/16/08: LVEF 55-60%.  Mild LVH.  RV and RA  mildly dilated.  Recent Labs: No results found for requested labs within last 365 days.    Lipid  Panel    Component Value Date/Time   CHOL 285 (H) 11/17/2020 1416   TRIG 103 11/17/2020 1416   HDL 57 11/17/2020 1416   CHOLHDL 5.0 (H) 11/17/2020 1416   CHOLHDL 4.3 04/02/2016 1554   VLDL 44 (H) 04/02/2016 1554   LDLCALC 210 (H) 11/17/2020 1416      Wt Readings from Last 3 Encounters:  07/01/22 183 lb 14.4 oz (83.4 kg)  06/29/22 181 lb (82.1 kg)  04/30/22 180 lb (81.6 kg)     ASSESSMENT AND PLAN: Essential hypertension Blood pressure is well-controlled.  Continue amlodipine, losartan and metoprolol. We will refer her to the exercise and nutrition program at [facility].  Coronary artery disease involving native coronary artery with angina pectoris S/p PCI.  She isn't having any angina.  Refer to exercise and PT.  Continue clopidogrel, metoprolol, and Repatha.  She will come back for fasting lipids and CMP, her LDL goal is less than 70.  PAF (paroxysmal atrial fibrillation) Currently maintaining sinus rhythm.  Continue metoprolol.  She is not on anticoagulation due to history of GI bleeding.  She is also had several admissions with alcohol intoxication which increases her risk of recurrent bleeding on anticoagulation.  Current medicines are reviewed at length with the patient today.  The patient does not have concerns regarding medicines.  The following changes have been made:  None  Labs/ tests ordered today include:  Orders Placed This Encounter  Procedures   Lipid panel   Comprehensive metabolic panel    Disposition:    FU with APP in 6 months. FU with Christiane Sistare C. Duke Salvia, MD, [facility] in 1 year.  I,Coren O'Brien,acting as a Neurosurgeon for [employer], MD.,have documented all relevant documentation on the behalf of Chilton Si, MD,as directed by  Chilton Si, MD while in the presence of Chilton Si, MD.  I, Benicio Manna C. Duke Salvia, MD have reviewed all documentation  for this visit.  The documentation of the exam, diagnosis, procedures, and orders on 07/24/2022  are all accurate and complete.  Signed, Chilton Si, MD  07/24/2022 5:49 PM    [facility]

## 2022-07-08 DIAGNOSIS — M25561 Pain in right knee: Secondary | ICD-10-CM | POA: Diagnosis not present

## 2022-07-08 DIAGNOSIS — M170 Bilateral primary osteoarthritis of knee: Secondary | ICD-10-CM | POA: Diagnosis not present

## 2022-07-08 DIAGNOSIS — M25562 Pain in left knee: Secondary | ICD-10-CM | POA: Diagnosis not present

## 2022-07-10 DIAGNOSIS — I10 Essential (primary) hypertension: Secondary | ICD-10-CM | POA: Diagnosis not present

## 2022-07-10 DIAGNOSIS — Z888 Allergy status to other drugs, medicaments and biological substances status: Secondary | ICD-10-CM | POA: Diagnosis not present

## 2022-07-10 DIAGNOSIS — R5383 Other fatigue: Secondary | ICD-10-CM | POA: Diagnosis not present

## 2022-07-10 DIAGNOSIS — I4891 Unspecified atrial fibrillation: Secondary | ICD-10-CM | POA: Diagnosis not present

## 2022-07-10 DIAGNOSIS — Z881 Allergy status to other antibiotic agents status: Secondary | ICD-10-CM | POA: Diagnosis not present

## 2022-07-10 DIAGNOSIS — Z7902 Long term (current) use of antithrombotics/antiplatelets: Secondary | ICD-10-CM | POA: Diagnosis not present

## 2022-07-10 DIAGNOSIS — Z1152 Encounter for screening for COVID-19: Secondary | ICD-10-CM | POA: Diagnosis not present

## 2022-07-10 DIAGNOSIS — Z79899 Other long term (current) drug therapy: Secondary | ICD-10-CM | POA: Diagnosis not present

## 2022-07-10 DIAGNOSIS — B349 Viral infection, unspecified: Secondary | ICD-10-CM | POA: Diagnosis not present

## 2022-07-10 DIAGNOSIS — Z91030 Bee allergy status: Secondary | ICD-10-CM | POA: Diagnosis not present

## 2022-07-10 DIAGNOSIS — Z882 Allergy status to sulfonamides status: Secondary | ICD-10-CM | POA: Diagnosis not present

## 2022-07-13 ENCOUNTER — Ambulatory Visit: Payer: Medicare Other | Admitting: Physician Assistant

## 2022-07-13 NOTE — Progress Notes (Incomplete)
No show

## 2022-07-15 ENCOUNTER — Ambulatory Visit: Payer: Medicare Other | Admitting: Physician Assistant

## 2022-07-15 ENCOUNTER — Encounter: Payer: Self-pay | Admitting: Physician Assistant

## 2022-07-15 DIAGNOSIS — Z029 Encounter for administrative examinations, unspecified: Secondary | ICD-10-CM
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
## 2022-07-24 ENCOUNTER — Encounter (HOSPITAL_BASED_OUTPATIENT_CLINIC_OR_DEPARTMENT_OTHER): Payer: Self-pay | Admitting: Cardiovascular Disease

## 2022-07-28 ENCOUNTER — Ambulatory Visit
Admission: EM | Admit: 2022-07-28 | Discharge: 2022-07-28 | Disposition: A | Discharge status: Home / Self Care | Payer: Medicare Other | Attending: Family Medicine | Admitting: Family Medicine

## 2022-07-28 DIAGNOSIS — Z888 Allergy status to other drugs, medicaments and biological substances status: Secondary | ICD-10-CM | POA: Diagnosis not present

## 2022-07-28 DIAGNOSIS — Z91030 Bee allergy status: Secondary | ICD-10-CM | POA: Diagnosis not present

## 2022-07-28 DIAGNOSIS — I10 Essential (primary) hypertension: Secondary | ICD-10-CM | POA: Diagnosis not present

## 2022-07-28 DIAGNOSIS — Z79899 Other long term (current) drug therapy: Secondary | ICD-10-CM | POA: Diagnosis not present

## 2022-07-28 DIAGNOSIS — R112 Nausea with vomiting, unspecified: Secondary | ICD-10-CM

## 2022-07-28 DIAGNOSIS — Z7901 Long term (current) use of anticoagulants: Secondary | ICD-10-CM | POA: Diagnosis not present

## 2022-07-28 DIAGNOSIS — R1012 Left upper quadrant pain: Secondary | ICD-10-CM | POA: Diagnosis not present

## 2022-07-28 DIAGNOSIS — K449 Diaphragmatic hernia without obstruction or gangrene: Secondary | ICD-10-CM | POA: Diagnosis not present

## 2022-07-28 DIAGNOSIS — Z1152 Encounter for screening for COVID-19: Secondary | ICD-10-CM | POA: Diagnosis not present

## 2022-07-28 DIAGNOSIS — Z882 Allergy status to sulfonamides status: Secondary | ICD-10-CM | POA: Diagnosis not present

## 2022-07-28 DIAGNOSIS — K5730 Diverticulosis of large intestine without perforation or abscess without bleeding: Secondary | ICD-10-CM | POA: Diagnosis not present

## 2022-07-28 DIAGNOSIS — K760 Fatty (change of) liver, not elsewhere classified: Secondary | ICD-10-CM | POA: Diagnosis not present

## 2022-07-28 DIAGNOSIS — Z885 Allergy status to narcotic agent status: Secondary | ICD-10-CM | POA: Diagnosis not present

## 2022-07-28 DIAGNOSIS — R10819 Abdominal tenderness, unspecified site: Secondary | ICD-10-CM | POA: Diagnosis not present

## 2022-07-28 DIAGNOSIS — N838 Other noninflammatory disorders of ovary, fallopian tube and broad ligament: Secondary | ICD-10-CM | POA: Diagnosis not present

## 2022-07-28 DIAGNOSIS — Z883 Allergy status to other anti-infective agents status: Secondary | ICD-10-CM | POA: Diagnosis not present

## 2022-07-28 MED ORDER — ONDANSETRON HCL 4 MG/2ML IJ SOLN: 4.0000 mg | Freq: Once | INTRAMUSCULAR | Status: DC

## 2022-07-28 MED ORDER — ONDANSETRON HCL 4 MG/2ML IJ SOLN
4.0000 mg | Freq: Once | INTRAMUSCULAR | Status: AC
  Administered 2022-07-28: 4 mg via INTRAMUSCULAR

## 2022-07-28 MED ORDER — ONDANSETRON 4 MG PO TBDP
4.0000 mg | ORAL_TABLET | Freq: Once | ORAL | Status: AC
  Administered 2022-07-28: 4 mg via ORAL

## 2022-07-28 NOTE — ED Provider Notes (Signed)
[name]    CSN: [id number] Arrival date & time: 07/28/22  1328      History   Chief Complaint Chief Complaint  Patient presents with   Emesis   Chills    HPI Maybelline Kolarik is a 67 y.o. female.   HPI 67 year old female presents with urgent care with nausea and vomiting for 3 days reports that she is unable to keep anything down except a cup of water today.  Patient reports recent history of GERD and fecal impaction.  PMH significant for bipolar 1 disorder, chronic atrial fibrillation, severe alcoholic abuse disorder, and HTN.  Patient is accompanied by her friend who drove her here this morning.  Patient reports today is the first day she has been able to keep her heart medication down.  Past Medical History:  Diagnosis Date   Abnormal mammogram    Thick Tissue   Acquired trigger finger of right ring finger 04/20/2022   Arthritis    Bilateral wrist pain 04/20/2022   Bipolar 1 disorder    Cataract    OU   Cervical radiculopathy 10/14/2021   Chalazion of left lower eyelid 04/15/2022   Chest pain 02/11/2015   Chronic atrial fibrillation 09/08/2015   Colon polyp    Compressed cervical disc    Coronary artery disease involving native coronary artery with angina pectoris 03/24/2015   Dorsalgia 09/22/2018   Dysphagia 09/22/2018   Essential hypertension 03/03/2015   Generalized anxiety disorder    GERD without esophagitis 09/22/2018   Hematemesis 12/18/2019   Hepatic steatosis 04/22/2022   History of appendectomy 1963   Hypertensive retinopathy    OU   Irritable bowel syndrome without diarrhea 09/22/2018   Lactic acidosis 12/18/2019   LLQ pain 04/15/2022   Major depressive disorder    Migraine without status migrainosus, not intractable 09/22/2018   Mixed hyperlipidemia 05/07/2016   Non-ST elevation MI (NSTEMI) 07/26/2008   No cardiac catheterization at the time.   Obstructive sleep apnea    no CPAP use   Orthostasis    Osteoarthritis of carpometacarpal  (CMC) joint of thumb 04/20/2022   Osteoarthritis of knee 09/22/2018   PAF (paroxysmal atrial fibrillation) 10/09/2018   Pain in joint of right hip 07/03/2020   Pain in right knee 12/14/2018   Recurrent syncope    Rib pain 07/03/2020   Sciatica of right side 09/22/2018   Severe alcohol use disorder    Vitamin D deficiency     Patient Active Problem List   Diagnosis Date Noted   Lumbar radiculopathy 05/26/2022   Degeneration of lumbar intervertebral disc 05/26/2022   Major depressive disorder    Severe alcohol use disorder    Generalized anxiety disorder    Obstructive sleep apnea    Hepatic steatosis 04/22/2022   Osteoarthritis of carpometacarpal (CMC) joint of thumb 04/20/2022   Acquired trigger finger of right ring finger 04/20/2022   Bilateral wrist pain 04/20/2022   Chalazion of left lower eyelid 04/15/2022   LLQ pain 04/15/2022   Bipolar 1 disorder    Cervical radiculopathy 10/14/2021   Pain in joint of right hip 07/03/2020   Rib pain 07/03/2020   Recurrent syncope    Orthostasis    Lactic acidosis 12/18/2019   Hematemesis 12/18/2019   Pain in right knee 12/14/2018   PAF (paroxysmal atrial fibrillation) 10/09/2018   Dorsalgia 09/22/2018   Dysphagia 09/22/2018   GERD without esophagitis 09/22/2018   Irritable bowel syndrome without diarrhea 09/22/2018   Osteoarthritis of knee 09/22/2018  Polypharmacy 09/22/2018   Sciatica of right side 09/22/2018   Vitamin D deficiency 09/22/2018   Mixed hyperlipidemia 05/07/2016   Chronic atrial fibrillation (HCC) 09/08/2015   Coronary artery disease involving native coronary artery with angina pectoris 03/24/2015   Essential hypertension 03/03/2015   Chest pain 02/11/2015   Non-ST elevation MI (NSTEMI) (HCC) 07/26/2008    Past Surgical History:  Procedure Laterality Date   APPENDECTOMY     CARDIAC CATHETERIZATION N/A 03/25/2015   Procedure: Left Heart Cath and Coronary Angiography;  Surgeon: Marykay Lex, MD;  Location:  [facility] INVASIVE CV LAB;  Service: Cardiovascular;  Laterality: N/A;   CORONARY STENT INTERVENTION N/A 10/09/2018   Procedure: CORONARY STENT INTERVENTION;  Surgeon: Corky Crafts, MD;  Location: [facility] INVASIVE CV LAB;  Service: Cardiovascular;  Laterality: N/A;   DILATATION & CURETTAGE/HYSTEROSCOPY WITH MYOSURE N/A 05/19/2016   Procedure: DILATATION & CURETTAGE/HYSTEROSCOPY WITH MYOSURE;  Surgeon: Geryl Rankins, MD;  Location: [facility]S;  Service: Gynecology;  Laterality: N/A;  Possible Myosure for polyps.   EYE SURGERY     LEFT HEART CATH AND CORONARY ANGIOGRAPHY N/A 10/09/2018   Procedure: LEFT HEART CATH AND CORONARY ANGIOGRAPHY;  Surgeon: Corky Crafts, MD;  Location: [facility] INVASIVE CV LAB;  Service: Cardiovascular;  Laterality: N/A;   MOUTH SURGERY     URETHRAL DILATION      OB History   No obstetric history on file.      Home Medications    Prior to Admission medications   Medication Sig Start Date End Date Taking? Authorizing Provider  acetaminophen (TYLENOL) 325 MG tablet Take 2 tablets (650 mg total) by mouth every 6 (six) hours as needed for mild pain (or Fever >/= 101). 12/19/19   Rodolph Bong, MD  amLODipine (NORVASC) 2.5 MG tablet Take 1 tablet (2.5 mg total) by mouth daily. 03/08/22   Ronney Asters, NP  azelastine (OPTIVAR) 0.05 % ophthalmic solution Place 2 drops into both eyes 2 (two) times daily. 04/29/22   Breeback, Jade L, PA-C  B Complex-C (SUPER B COMPLEX PO) Take by mouth.    [provider]  BIOTIN PO Take 1 tablet by mouth daily.    [provider]  buPROPion (WELLBUTRIN XL) 150 MG 24 hr tablet Take 1 tablet (150 mg total) by mouth daily. 06/29/22   Charlton Amor, DO  Cholecalciferol (VITAMIN D3) 125 MCG (5000 UT) CAPS Take 5,000 Units by mouth daily.    [provider]  clopidogrel (PLAVIX) 75 MG tablet Take 1 tablet (75 mg total) by mouth daily. 11/17/21   Alver Sorrow, NP  Cyanocobalamin (B-12 PO) Take 1 tablet by mouth  daily.    [provider]  diclofenac Sodium (VOLTAREN) 1 % GEL Apply topically as needed.    [provider]  esomeprazole (NEXIUM 24HR CLEAR MINIS) 20 MG capsule Take 1 capsule (20 mg total) by mouth daily before breakfast. 12/19/19   Rodolph Bong, MD  Evolocumab (REPATHA SURECLICK) 140 MG/ML SOAJ Inject 140 mg into the skin every 14 (fourteen) days. 03/31/22   Chilton Si, MD  gabapentin (NEURONTIN) 100 MG capsule Take 3 capsules by mouth at bedtime. 01/30/21   [provider]  losartan (COZAAR) 50 MG tablet TAKE ONE TABLET BY MOUTH ONE TIME DAILY 01/07/22   Chilton Si, MD  metoprolol tartrate (LOPRESSOR) 25 MG tablet TAKE ONE-HALF TABLET BY MOUTH TWICE A DAY . 01/07/22   Hilty, Lisette Abu, MD  nitroGLYCERIN (NITROSTAT) 0.4 MG SL tablet Place  1 tablet (0.4 mg total) under the tongue as needed for chest pain. 11/17/21   Loel Dubonnet, NP  Omega-3 Fatty Acids (OMEGA 3 PO) Take 1 capsule by mouth daily.    [provider]  Plant Sterols and Stanols (CHOLESTOFF PLUS PO) Take by mouth daily.    [provider]  predniSONE (DELTASONE) 20 MG tablet Take 1 tablet (20 mg total) by mouth 2 (two) times daily with a meal. 06/10/22   Raylene Everts, MD  Probiotic Product (PROBIOTIC ADVANCED PO) Take by mouth daily.    [provider]  traZODone (DESYREL) 50 MG tablet Take 25 mg by mouth at bedtime.    [provider]    Family History Family History  Problem Relation Age of Onset   Vascular Disease Mother    Dementia Mother    Hypertension Mother    Stroke Mother    Depression Mother    Dementia Father    Heart attack Father    Stroke Father    Heart attack Maternal Grandmother    Heart attack Maternal Grandfather    Kidney failure Paternal Grandmother    Heart attack Paternal Grandfather     Social History Social History   Tobacco Use   Smoking status: Never   Smokeless tobacco: Never  Vaping Use   Vaping  Use: Never used  Substance Use Topics   Alcohol use: Not Currently    Comment: hx of severe alcohol abuse; most recent admission 02/2022   Drug use: Never     Allergies   Sulfa antibiotics, Ciprofloxacin, Depakote [divalproex sodium], Pine, Risperidone, Seroquel [quetiapine fumarate], Statins, Tramadol, Citalopram, Lamotrigine, Other, Topiramate, Wasp venom, and Zetia [ezetimibe]   Review of Systems Review of Systems  Gastrointestinal:  Positive for nausea and vomiting.  All other systems reviewed and are negative.    Physical Exam Triage Vital Signs ED Triage Vitals  Enc Vitals Group     BP      Pulse      Resp      Temp      Temp src      SpO2      Weight      Height      Head Circumference      Peak Flow      Pain Score      Pain Loc      Pain Edu?      Excl. in [place]?    No data found.  Updated Vital Signs BP (!) 138/94   Pulse 68   Temp 98.4 F (36.9 C) (Oral)   Resp 20   Ht 5\' 2"  (1.575 m)   Wt 180 lb (81.6 kg)   SpO2 96%   BMI 32.92 kg/m   Visual Acuity Right Eye Distance:   Left Eye Distance:   Bilateral Distance:    Right Eye Near:   Left Eye Near:    Bilateral Near:     Physical Exam Vitals and nursing note reviewed.  Constitutional:      General: She is not in acute distress.    Appearance: Normal appearance. She is normal weight. She is ill-appearing.  HENT:     Head: Normocephalic and atraumatic.     Right Ear: Tympanic membrane, ear canal and external ear normal.     Left Ear: Tympanic membrane, ear canal and external ear normal.     Mouth/Throat:     Mouth: Mucous membranes are moist.     Pharynx:  Oropharynx is clear.  Eyes:     Extraocular Movements: Extraocular movements intact.     Conjunctiva/sclera: Conjunctivae normal.     Pupils: Pupils are equal, round, and reactive to light.  Cardiovascular:     Rate and Rhythm: Normal rate and regular rhythm.     Pulses: Normal pulses.     Heart sounds: Normal heart sounds. No murmur  heard. Pulmonary:     Effort: Pulmonary effort is normal.     Breath sounds: Normal breath sounds. No wheezing, rhonchi or rales.  Musculoskeletal:        General: Normal range of motion.     Cervical back: Normal range of motion and neck supple.  Skin:    General: Skin is warm and dry.  Neurological:     General: No focal deficit present.     Mental Status: She is alert and oriented to person, place, and time. Mental status is at baseline.      UC Treatments / Results  Labs (all labs ordered are listed, but only abnormal results are displayed) Labs Reviewed - No data to display  EKG   Radiology No results found.  Procedures Procedures (including critical care time)  Medications Ordered in UC Medications  ondansetron (ZOFRAN-ODT) disintegrating tablet 4 mg (4 mg Oral Given 07/28/22 1453)  ondansetron (ZOFRAN) injection 4 mg (4 mg Intramuscular Given 07/28/22 1520)    Initial Impression / Assessment and Plan / UC Course  I have reviewed the triage vital signs and the nursing notes.  Pertinent labs & imaging results that were available during my care of the patient were reviewed by me and considered in my medical decision making (see chart for details).     MDM: 1.  Nausea and vomiting, unspecified vomiting type-IM Zofran 4 mg and p.o. ODT Zofran 4 mg given once in clinic prior to discharge, Asked patient to go to [facility] ED for further evaluation to include serological testing and IV fluids.  Patient agreed and verbalized understanding of these instructions and this plan of care today.  Urged home, hemodynamically stable. Final Clinical Impressions(s) / UC Diagnoses   Final diagnoses:  Nausea and vomiting, unspecified vomiting type     Discharge Instructions      Asked patient to go to [facility] ED for further evaluation to include serological testing and IV fluids.     ED Prescriptions   None     PDMP not reviewed this encounter.   Trevor Iha, FNP 07/28/22 1605

## 2022-07-28 NOTE — ED Triage Notes (Addendum)
Pt presents to Urgent Care with c/o N/V x 3 days. Reports she has been unable to keep anything down except a cup of water today. Also reports chills; afebrile. Dark brown emesis reported. Reports Reports recent hx of GERD and fecal impaction.

## 2022-07-28 NOTE — ED Notes (Signed)
Patient is being discharged from the Urgent Care and sent to the Emergency Department via pov . Per M. Ragan, FNP, patient is in need of higher level of care due to assessment of labs and rehydration. Patient is aware and verbalizes understanding of plan of care.  Vitals:   07/28/22 1444  BP: (!) 138/94  Pulse: 68  Resp: 20  Temp: 98.4 F (36.9 C)  SpO2: 96%

## 2022-07-28 NOTE — Discharge Instructions (Addendum)
Asked patient to go to [facility] ED for further evaluation to include serological testing and IV fluids.

## 2022-09-07 ENCOUNTER — Ambulatory Visit (HOSPITAL_BASED_OUTPATIENT_CLINIC_OR_DEPARTMENT_OTHER): Payer: Medicare Other | Admitting: Cardiovascular Disease

## 2022-10-26 ENCOUNTER — Telehealth: Payer: Self-pay | Admitting: Cardiovascular Disease

## 2022-10-26 NOTE — Telephone Encounter (Signed)
  Pt is calling, to get an assistance to re-enroll for pt assistance for her repatha. She also wanted to know if she needs to get lab work to check cholesterol level

## 2022-10-26 NOTE — Telephone Encounter (Signed)
Hey love, I was looking through her last Hilty not and looks like she does the [employer] grant, I am not sure how to help her with this!

## 2022-10-27 ENCOUNTER — Other Ambulatory Visit: Payer: Self-pay | Admitting: Family Medicine

## 2022-10-27 ENCOUNTER — Encounter: Payer: Self-pay | Admitting: Family Medicine

## 2022-10-27 ENCOUNTER — Ambulatory Visit (INDEPENDENT_AMBULATORY_CARE_PROVIDER_SITE_OTHER): Payer: Medicare Other | Admitting: Family Medicine

## 2022-10-27 VITALS — BP 94/62 | HR 69 | Ht 62.0 in | Wt 181.0 lb

## 2022-10-27 DIAGNOSIS — R5383 Other fatigue: Secondary | ICD-10-CM | POA: Diagnosis not present

## 2022-10-27 DIAGNOSIS — F319 Bipolar disorder, unspecified: Secondary | ICD-10-CM

## 2022-10-27 DIAGNOSIS — E559 Vitamin D deficiency, unspecified: Secondary | ICD-10-CM | POA: Diagnosis not present

## 2022-10-27 DIAGNOSIS — M542 Cervicalgia: Secondary | ICD-10-CM | POA: Diagnosis not present

## 2022-10-27 DIAGNOSIS — F1020 Alcohol dependence, uncomplicated: Secondary | ICD-10-CM

## 2022-10-27 DIAGNOSIS — F331 Major depressive disorder, recurrent, moderate: Secondary | ICD-10-CM

## 2022-10-27 DIAGNOSIS — M5431 Sciatica, right side: Secondary | ICD-10-CM | POA: Diagnosis not present

## 2022-10-27 DIAGNOSIS — Z13220 Encounter for screening for lipoid disorders: Secondary | ICD-10-CM

## 2022-10-27 DIAGNOSIS — M546 Pain in thoracic spine: Secondary | ICD-10-CM

## 2022-10-27 DIAGNOSIS — E785 Hyperlipidemia, unspecified: Secondary | ICD-10-CM | POA: Diagnosis not present

## 2022-10-27 DIAGNOSIS — D649 Anemia, unspecified: Secondary | ICD-10-CM | POA: Diagnosis not present

## 2022-10-27 MED ORDER — CYCLOBENZAPRINE HCL 5 MG PO TABS: 5.0000 mg | ORAL_TABLET | Freq: Every day | ORAL | 0 refills | Status: DC

## 2022-10-27 MED ORDER — METHYLPREDNISOLONE 4 MG PO TBPK: ORAL_TABLET | ORAL | 0 refills | Status: DC

## 2022-10-27 NOTE — Assessment & Plan Note (Signed)
-   will give steroid course to help knock down inflammation.

## 2022-10-27 NOTE — Assessment & Plan Note (Signed)
-   wellbutrin is doing well for her, will continue

## 2022-10-27 NOTE — Assessment & Plan Note (Addendum)
-   chronic per patient report, will refer to neurospine for further eval - on physical exam muscles of trapezius are tight most likely due to compensation mechanism from neck pain, will do a trial of flexeril 5mg  nightly to help relieve muscle spasm - also believe PT referral will help

## 2022-10-27 NOTE — Progress Notes (Unsigned)
Established patient visit   Patient: Lindsay [name]   DOB: September 09, 1955   67 y.o. Female  MRN: [id number] Visit Date: 10/27/2022  Today's healthcare provider: Owens Loffler, DO   Chief Complaint  Patient presents with   Neck Pain   Dizziness   Hypotension    SUBJECTIVE    Chief Complaint  Patient presents with   Neck Pain   Dizziness   Hypotension   HPI  Pt presents for wellbutrin follow up. She notes it has improved her mood and is working well.   Pain has been around for a year with coming and going. She wonders if it is related to the weather. She presents with a cane today. She is having R knee pain and has pain from her coccyx from her spine to hip, thigh, leg, and foot. She has a history of sciatica.   She is very tearful today. She has financial difficulties, but does not want me to contact social services. She tells me her house is a mess with dust and dirt and it is the worst it has been in years. She is very worried about losing her job due to her pain and would like me to fill out FMLA paperwork.   Review of Systems  Constitutional:  Negative for activity change, fatigue and fever.  Respiratory:  Negative for cough and shortness of breath.   Cardiovascular:  Negative for chest pain.  Gastrointestinal:  Negative for abdominal pain.  Genitourinary:  Negative for difficulty urinating.  Musculoskeletal:  Positive for back pain and neck pain.      Current Meds  Medication Sig   acetaminophen (TYLENOL) 325 MG tablet Take 2 tablets (650 mg total) by mouth every 6 (six) hours as needed for mild pain (or Fever >/= 101).   amLODipine (NORVASC) 2.5 MG tablet Take 1 tablet (2.5 mg total) by mouth daily.   B Complex-C (SUPER B COMPLEX PO) Take by mouth.   BIOTIN PO Take 1 tablet by mouth daily.   buPROPion (WELLBUTRIN XL) 150 MG 24 hr tablet Take 1 tablet (150 mg total) by mouth daily.   Cholecalciferol (VITAMIN D3) 125 MCG (5000 UT) CAPS Take 5,000 Units by mouth daily.    clopidogrel (PLAVIX) 75 MG tablet Take 1 tablet (75 mg total) by mouth daily.   Cyanocobalamin (B-12 PO) Take 1 tablet by mouth daily.   cyclobenzaprine (FLEXERIL) 5 MG tablet Take 1 tablet (5 mg total) by mouth at bedtime.   diclofenac Sodium (VOLTAREN) 1 % GEL Apply topically as needed.   esomeprazole (NEXIUM 24HR CLEAR MINIS) 20 MG capsule Take 1 capsule (20 mg total) by mouth daily before breakfast.   Evolocumab (REPATHA SURECLICK) [id number] MG/ML SOAJ Inject 140 mg into the skin every 14 (fourteen) days.   gabapentin (NEURONTIN) 100 MG capsule Take 3 capsules by mouth at bedtime.   losartan (COZAAR) 50 MG tablet TAKE ONE TABLET BY MOUTH ONE TIME DAILY   metoprolol tartrate (LOPRESSOR) 25 MG tablet TAKE ONE-HALF TABLET BY MOUTH TWICE A DAY .   nitroGLYCERIN (NITROSTAT) 0.4 MG SL tablet Place 1 tablet (0.4 mg total) under the tongue as needed for chest pain.   Omega-3 Fatty Acids (OMEGA 3 PO) Take 1 capsule by mouth daily.   Plant Sterols and Stanols (CHOLESTOFF PLUS PO) Take by mouth daily.   Probiotic Product (PROBIOTIC ADVANCED PO) Take by mouth daily.   traZODone (DESYREL) 50 MG tablet Take 25 mg by mouth at bedtime.   [DISCONTINUED]  methylPREDNISolone (MEDROL DOSEPAK) 4 MG TBPK tablet Take as directed on packet    OBJECTIVE    BP 94/62 (BP Location: Left Arm, Patient Position: Sitting, Cuff Size: Normal)   Pulse 69   Ht 5\' 2"  (1.575 m)   Wt 181 lb (82.1 kg)   SpO2 96%   BMI 33.11 kg/m   Physical Exam Vitals and nursing note reviewed.  Constitutional:      General: She is not in acute distress.    Appearance: Normal appearance.  HENT:     Head: Normocephalic and atraumatic.     Right Ear: External ear normal.     Left Ear: External ear normal.     Nose: Nose normal.  Eyes:     Conjunctiva/sclera: Conjunctivae normal.  Cardiovascular:     Rate and Rhythm: Normal rate and regular rhythm.  Pulmonary:     Effort: Pulmonary effort is normal.     Breath sounds: Normal breath  sounds.  Neurological:     General: No focal deficit present.     Mental Status: She is alert and oriented to person, place, and time.  Psychiatric:        Mood and Affect: Mood normal.        Behavior: Behavior normal.        Thought Content: Thought content normal.        Judgment: Judgment normal.       ASSESSMENT & PLAN    Problem List Items Addressed This Visit       Nervous and Auditory   Sciatica of right side    - will give steroid course to help knock down inflammation.       Relevant Medications   cyclobenzaprine (FLEXERIL) 5 MG tablet     Other   Vitamin D deficiency    - will order Vit D levels and see if this could be the cause of her fatigue      Relevant Orders   Vitamin D (25 hydroxy)   Bipolar 1 disorder    - pt is very tearful today and currently not on therapy for Bipolar 1. We discussed how beneficial this medication would be for her and she says she has been on it in the past and took herself off this medication. I encouraged her to consider going back on this to help with mood.       Major depressive disorder    - wellbutrin is doing well for her, will continue      Severe alcohol use disorder   Neck pain    - chronic per patient report, will refer to neurospine for further eval - on physical exam muscles of trapezius are tight most likely due to compensation mechanism from neck pain, will do a trial of flexeril 5mg  nightly to help relieve muscle spasm - also believe PT referral will help      Relevant Medications   cyclobenzaprine (FLEXERIL) 5 MG tablet   Other Relevant Orders   Ambulatory referral to Spine Surgery   Acute thoracic back pain - Primary   Relevant Medications   cyclobenzaprine (FLEXERIL) 5 MG tablet   Other Relevant Orders   Ambulatory referral to Physical Therapy   Ambulatory referral to Spine Surgery   Other Visit Diagnoses     Other fatigue       Relevant Orders   TSH + free T4 (Completed)   Vitamin B12  (Completed)   Fe+TIBC+Fer (Completed)   Screening for lipid disorders  Relevant Orders   Lipid panel (Completed)       Return in about 3 months (around 01/26/2023).      Meds ordered this encounter  Medications   DISCONTD: methylPREDNISolone (MEDROL DOSEPAK) 4 MG TBPK tablet    Sig: Take as directed on packet    Dispense:  28 tablet    Refill:  0   cyclobenzaprine (FLEXERIL) 5 MG tablet    Sig: Take 1 tablet (5 mg total) by mouth at bedtime.    Dispense:  30 tablet    Refill:  0    Orders Placed This Encounter  Procedures   TSH + free T4   Vitamin B12   Fe+TIBC+Fer   Lipid panel    Order Specific Question:   Has the patient fasted?    Answer:   No    Order Specific Question:   Release to patient    Answer:   Immediate   Vitamin D (25 hydroxy)   VITAMIN D 25 Hydroxy (Vit-D Deficiency, Fractures)   Ambulatory referral to Physical Therapy    Referral Priority:   Routine    Referral Type:   Physical Medicine    Referral Reason:   Specialty Services Required    Requested Specialty:   Physical Therapy    Number of Visits Requested:   1   Ambulatory referral to Spine Surgery    Referral Priority:   Routine    Referral Type:   Surgical    Referral Reason:   Specialty Services Required    Requested Specialty:   Neurosurgery    Number of Visits Requested:   [place] at [facility] [phone] (phone) [phone] (fax)  [place]

## 2022-10-27 NOTE — Telephone Encounter (Signed)
Patient re-approved for [employer] grant from 10/07/23 -- 10/06/23  [employer] ID [id number]  START DATE 10/07/2022   END DATE 10/06/2023   ASSISTANCE TYPE Co-pay   PAID $0.00   PENDING $0.00   GRANT BALANCE $2500.00  Pharmacy Card CARD NO. [id number]   BIN [id number]   PCN [id number]   PC GROUP [id number]  PROVIDER PDMI   PROCESSOR PDMI

## 2022-10-27 NOTE — Assessment & Plan Note (Signed)
-   will order Vit D levels and see if this could be the cause of her fatigue

## 2022-10-28 ENCOUNTER — Ambulatory Visit: Payer: Medicare Other | Admitting: Family Medicine

## 2022-10-28 ENCOUNTER — Other Ambulatory Visit: Payer: Self-pay | Admitting: Family Medicine

## 2022-10-28 DIAGNOSIS — D689 Coagulation defect, unspecified: Secondary | ICD-10-CM | POA: Insufficient documentation

## 2022-10-28 DIAGNOSIS — E559 Vitamin D deficiency, unspecified: Secondary | ICD-10-CM

## 2022-10-28 LAB — LIPID PANEL
Cholesterol: 216 mg/dL — ABNORMAL HIGH (ref <200)
HDL: 77 mg/dL (ref >=50)
LDL Cholesterol (Calc): 116 mg/dL (calc) — ABNORMAL HIGH
Non-HDL Cholesterol (Calc): 139 mg/dL (calc) — ABNORMAL HIGH (ref <130)
Total CHOL/HDL Ratio: 2.8 (calc) (ref <5.0)
Triglycerides: 119 mg/dL (ref <150)

## 2022-10-28 LAB — IRON,TIBC AND FERRITIN PANEL
%SAT: 33 % (calc) (ref 16–45)
Ferritin: 195 ng/mL (ref 16–288)
Iron: 95 ug/dL (ref 45–160)
TIBC: 289 mcg/dL (calc) (ref 250–450)

## 2022-10-28 LAB — TSH+FREE T4: TSH W/REFLEX TO FT4: 1.71 mIU/L (ref 0.40–4.50)

## 2022-10-28 LAB — VITAMIN B12: Vitamin B-12: 429 pg/mL (ref 200–1100)

## 2022-10-28 LAB — VITAMIN D 25 HYDROXY (VIT D DEFICIENCY, FRACTURES): Vit D, 25-Hydroxy: 27 ng/mL — ABNORMAL LOW (ref 30–100)

## 2022-10-28 MED ORDER — CHOLECALCIFEROL 20 MCG (800 UNIT) PO TABS: 1.0000 | ORAL_TABLET | Freq: Every day | ORAL | 0 refills | Status: DC

## 2022-10-28 NOTE — Assessment & Plan Note (Addendum)
-   pt is very tearful today and currently not on therapy for Bipolar 1. We discussed how beneficial this medication would be for her and she says she has been on it in the past and took herself off this medication. I encouraged her to consider going back on this to help with mood.

## 2022-11-04 ENCOUNTER — Encounter: Payer: Self-pay | Admitting: Family Medicine

## 2022-11-04 ENCOUNTER — Ambulatory Visit (INDEPENDENT_AMBULATORY_CARE_PROVIDER_SITE_OTHER): Payer: Medicare Other | Admitting: Family Medicine

## 2022-11-04 VITALS — BP 123/79 | HR 75 | Temp 99.7°F | Ht 62.0 in | Wt 180.4 lb

## 2022-11-04 DIAGNOSIS — M546 Pain in thoracic spine: Secondary | ICD-10-CM

## 2022-11-04 DIAGNOSIS — F319 Bipolar disorder, unspecified: Secondary | ICD-10-CM

## 2022-11-04 DIAGNOSIS — F331 Major depressive disorder, recurrent, moderate: Secondary | ICD-10-CM

## 2022-11-04 NOTE — Assessment & Plan Note (Signed)
-   discussed with patient we have not gotten FMLA paperwork yet and I advised her to reach back out to employer - discussed patient call physical therapy to schedule an apt for back pain

## 2022-11-04 NOTE — Assessment & Plan Note (Signed)
-   pt not on therapy discussed possible vraylar; discussed that she needs to see psych and she is open to it and knows she needs to be treated at this time.

## 2022-11-04 NOTE — Progress Notes (Signed)
Established patient visit   Patient: Lindsay [name]   DOB: [birth year]   67 y.o. Female  MRN: [id number] Visit Date: 11/04/2022  Today's healthcare provider: Charlton Amor, DO   Chief Complaint  Patient presents with   Form Completion    She is requesting FMLA for PT due to knee and hip pain. She has not started progressive Prednisone regimen because of concerns of effects from the medicine. She would like to be treated by [facility].    SUBJECTIVE    Chief Complaint  Patient presents with   Form Completion    She is requesting FMLA for PT due to knee and hip pain. She has not started progressive Prednisone regimen because of concerns of effects from the medicine. She would like to be treated by [facility].   HPI HPI     Form Completion    Additional comments: She is requesting FMLA for PT due to knee and hip pain. She has not started progressive Prednisone regimen because of concerns of effects from the medicine. She would like to be treated by [facility].      Last edited by Yolanda Manges, CMA on 11/04/2022  1:32 PM.      Pt presents for discussion about FMLA paperwork. She saw me last week for chronic back pain and sciatica. 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   Pt is requesting physical therapy   Review of Systems  Constitutional:  Negative for activity change, fatigue and fever.  Respiratory:  Negative for cough and shortness of breath.   Cardiovascular:  Negative for chest pain.  Gastrointestinal:  Negative for abdominal pain.  Genitourinary:  Negative for difficulty urinating.  Musculoskeletal:  Positive for back pain.       No outpatient medications have been marked as taking for the 11/04/22 encounter (Office Visit) with Charlton Amor, DO.    OBJECTIVE    BP 123/79   Pulse 75   Temp 99.7 F (37.6 C) (Oral)   Ht 5\' 2"  (1.575 m)   Wt 180 lb 6.4 oz (81.8 kg)   SpO2 96%   BMI 33.00 kg/m    Physical Exam Vitals and nursing note reviewed.  Constitutional:      General: She is not in acute distress.    Appearance: Normal appearance.  HENT:     Head: Normocephalic and atraumatic.     Right Ear: External ear normal.     Left Ear: External ear normal.     Nose: Nose normal.  Eyes:     Conjunctiva/sclera: Conjunctivae normal.  Cardiovascular:     Rate and Rhythm: Normal rate and regular rhythm.  Pulmonary:     Effort: Pulmonary effort is normal.     Breath sounds: Normal breath sounds.  Neurological:     General: No focal deficit present.     Mental Status: She is alert and oriented to person, place, and time.  Psychiatric:        Mood and Affect: Mood normal.        Behavior: Behavior normal.        Thought Content: Thought content normal.        Judgment: Judgment normal.          ASSESSMENT & PLAN    Problem List Items Addressed This Visit       Other   Bipolar 1 disorder - Primary    -  pt not on therapy discussed possible vraylar; discussed that she needs to see psych and she is open to it and knows she needs to be treated at this time.       Relevant Orders   Ambulatory referral to Psychiatry   Major depressive disorder   Relevant Orders   Ambulatory referral to Psychiatry   Acute thoracic back pain    - discussed with patient we have not gotten FMLA paperwork yet and I advised her to reach back out to employer - discussed patient call physical therapy to schedule an apt for back pain        Return if symptoms worsen or fail to improve.      No orders of the defined types were placed in this encounter.   Orders Placed This Encounter  Procedures   Ambulatory referral to Psychiatry    Referral Priority:   Urgent    Referral Type:   Psychiatric    Referral Reason:   Specialty Services Required    Requested Specialty:   Psychiatry    Number of Visits Requested:   1     Charlton Amor, DO  [facility] at  [facility] [phone] (phone) [phone] (fax)  [facility]

## 2022-11-04 NOTE — Patient Instructions (Addendum)
Call [facility] ASAP for pain   Call physical therapy for an apt   Look up Vraylar

## 2022-11-09 ENCOUNTER — Other Ambulatory Visit: Payer: Self-pay | Admitting: *Deleted

## 2022-11-09 DIAGNOSIS — I10 Essential (primary) hypertension: Secondary | ICD-10-CM

## 2022-11-09 MED ORDER — AMLODIPINE BESYLATE 2.5 MG PO TABS: 2.5000 mg | ORAL_TABLET | Freq: Every day | ORAL | 1 refills | Status: DC

## 2022-11-25 ENCOUNTER — Telehealth: Payer: Self-pay

## 2022-11-25 NOTE — Telephone Encounter (Signed)
Hi Dr. Tamera Punt, I looked for the paperwork, and I do not see anything, would the paperwork be upfront?

## 2022-11-25 NOTE — Telephone Encounter (Signed)
Patient came into office to drop off FMLA paperwork, forms placed in Dr.Wach's box, thanks.

## 2022-11-25 NOTE — Telephone Encounter (Signed)
Patient called to check on the status of her FMLA paperwork. Patient was told it could take 3-5 business days to complete.

## 2022-11-26 NOTE — Telephone Encounter (Signed)
Printed the [employer] paperwork from chart.

## 2022-12-06 ENCOUNTER — Telehealth (HOSPITAL_COMMUNITY): Payer: Self-pay | Admitting: Family Medicine

## 2022-12-08 DIAGNOSIS — M542 Cervicalgia: Secondary | ICD-10-CM | POA: Diagnosis not present

## 2022-12-08 DIAGNOSIS — G8929 Other chronic pain: Secondary | ICD-10-CM | POA: Diagnosis not present

## 2022-12-08 DIAGNOSIS — M546 Pain in thoracic spine: Secondary | ICD-10-CM | POA: Diagnosis not present

## 2022-12-08 DIAGNOSIS — M545 Low back pain, unspecified: Secondary | ICD-10-CM | POA: Diagnosis not present

## 2022-12-10 ENCOUNTER — Other Ambulatory Visit: Payer: Self-pay | Admitting: Family Medicine

## 2022-12-10 DIAGNOSIS — F331 Major depressive disorder, recurrent, moderate: Secondary | ICD-10-CM

## 2022-12-15 ENCOUNTER — Ambulatory Visit: Payer: Medicare Other | Attending: Family Medicine

## 2022-12-16 ENCOUNTER — Ambulatory Visit: Payer: Medicare Other

## 2022-12-21 ENCOUNTER — Ambulatory Visit: Payer: Medicare Other

## 2022-12-23 DIAGNOSIS — G4700 Insomnia, unspecified: Secondary | ICD-10-CM | POA: Diagnosis not present

## 2022-12-23 DIAGNOSIS — M5136 Other intervertebral disc degeneration, lumbar region: Secondary | ICD-10-CM | POA: Diagnosis not present

## 2022-12-23 DIAGNOSIS — M5416 Radiculopathy, lumbar region: Secondary | ICD-10-CM | POA: Diagnosis not present

## 2022-12-23 DIAGNOSIS — M5412 Radiculopathy, cervical region: Secondary | ICD-10-CM | POA: Diagnosis not present

## 2022-12-29 ENCOUNTER — Encounter: Payer: Self-pay | Admitting: Physical Therapy

## 2022-12-29 ENCOUNTER — Ambulatory Visit: Payer: Medicare Other | Attending: Family Medicine | Admitting: Physical Therapy

## 2022-12-29 ENCOUNTER — Other Ambulatory Visit: Payer: Self-pay

## 2022-12-29 DIAGNOSIS — M6281 Muscle weakness (generalized): Secondary | ICD-10-CM | POA: Diagnosis not present

## 2022-12-29 DIAGNOSIS — M25562 Pain in left knee: Secondary | ICD-10-CM | POA: Insufficient documentation

## 2022-12-29 DIAGNOSIS — G8929 Other chronic pain: Secondary | ICD-10-CM | POA: Insufficient documentation

## 2022-12-29 DIAGNOSIS — R262 Difficulty in walking, not elsewhere classified: Secondary | ICD-10-CM | POA: Diagnosis not present

## 2022-12-29 DIAGNOSIS — M25561 Pain in right knee: Secondary | ICD-10-CM | POA: Diagnosis not present

## 2022-12-29 DIAGNOSIS — M546 Pain in thoracic spine: Secondary | ICD-10-CM | POA: Insufficient documentation

## 2022-12-29 NOTE — Therapy (Signed)
OUTPATIENT PHYSICAL THERAPY LOWER EXTREMITY EVALUATION   Patient Name: Lindsay [name] MRN: [id number] DOB:1956/02/06, 67 y.o., female Today's Date: 12/29/2022  END OF SESSION:  PT End of Session - 12/29/22 0940     Visit Number 1    Number of Visits 12    Date for PT Re-Evaluation 02/09/23    Authorization Type Medicare    Authorization - Visit Number 1    Progress Note Due on Visit 10    PT Start Time 0845    PT Stop Time 0930    PT Time Calculation (min) 45 min    Activity Tolerance Patient tolerated treatment well    Behavior During Therapy [facility] for tasks assessed/performed             Past Medical History:  Diagnosis Date   Abnormal mammogram    Thick Tissue   Acquired trigger finger of right ring finger 04/20/2022   Arthritis    Bilateral wrist pain 04/20/2022   Bipolar 1 disorder    Cataract    OU   Cervical radiculopathy 10/14/2021   Chalazion of left lower eyelid 04/15/2022   Chest pain 02/11/2015   Chronic atrial fibrillation 09/08/2015   Colon polyp    Compressed cervical disc    Coronary artery disease involving native coronary artery with angina pectoris 03/24/2015   Dorsalgia 09/22/2018   Dysphagia 09/22/2018   Essential hypertension 03/03/2015   Generalized anxiety disorder    GERD without esophagitis 09/22/2018   Hematemesis 12/18/2019   Hepatic steatosis 04/22/2022   History of appendectomy 1963   Hypertensive retinopathy    OU   Irritable bowel syndrome without diarrhea 09/22/2018   Lactic acidosis 12/18/2019   LLQ pain 04/15/2022   Major depressive disorder    Migraine without status migrainosus, not intractable 09/22/2018   Mixed hyperlipidemia 05/07/2016   Non-ST elevation MI (NSTEMI) 07/26/2008   No cardiac catheterization at the time.   Obstructive sleep apnea    no CPAP use   Orthostasis    Osteoarthritis of carpometacarpal (CMC) joint of thumb 04/20/2022   Osteoarthritis of knee 09/22/2018   PAF (paroxysmal atrial fibrillation)  10/09/2018   Pain in joint of right hip 07/03/2020   Pain in right knee 12/14/2018   Recurrent syncope    Rib pain 07/03/2020   Sciatica of right side 09/22/2018   Severe alcohol use disorder    Vitamin D deficiency    Past Surgical History:  Procedure Laterality Date   APPENDECTOMY     CARDIAC CATHETERIZATION N/A 03/25/2015   Procedure: Left Heart Cath and Coronary Angiography;  Surgeon: Marykay Lex, MD;  Location: [facility] INVASIVE CV LAB;  Service: Cardiovascular;  Laterality: N/A;   CORONARY STENT INTERVENTION N/A 10/09/2018   Procedure: CORONARY STENT INTERVENTION;  Surgeon: Corky Crafts, MD;  Location: [facility] INVASIVE CV LAB;  Service: Cardiovascular;  Laterality: N/A;   DILATATION & CURETTAGE/HYSTEROSCOPY WITH MYOSURE N/A 05/19/2016   Procedure: DILATATION & CURETTAGE/HYSTEROSCOPY WITH MYOSURE;  Surgeon: Geryl Rankins, MD;  Location: [facility];  Service: Gynecology;  Laterality: N/A;  Possible Myosure for polyps.   EYE SURGERY     LEFT HEART CATH AND CORONARY ANGIOGRAPHY N/A 10/09/2018   Procedure: LEFT HEART CATH AND CORONARY ANGIOGRAPHY;  Surgeon: Corky Crafts, MD;  Location: [facility] INVASIVE CV LAB;  Service: Cardiovascular;  Laterality: N/A;   MOUTH SURGERY     URETHRAL DILATION     Patient Active Problem List   Diagnosis Date Noted   Blood clotting disorder (  HCC) 10/28/2022   Neck pain 10/27/2022   Acute thoracic back pain 10/27/2022   Lumbar radiculopathy 05/26/2022   Degeneration of lumbar intervertebral disc 05/26/2022   Major depressive disorder    Severe alcohol use disorder    Generalized anxiety disorder    Obstructive sleep apnea    Hepatic steatosis 04/22/2022   Osteoarthritis of carpometacarpal (CMC) joint of thumb 04/20/2022   Acquired trigger finger of right ring finger 04/20/2022   Bilateral wrist pain 04/20/2022   Chalazion of left lower eyelid 04/15/2022   LLQ pain 04/15/2022   Bipolar 1 disorder (HCC)    Cervical radiculopathy 10/14/2021   Pain  in joint of right hip 07/03/2020   Rib pain 07/03/2020   Recurrent syncope    Orthostasis    Lactic acidosis 12/18/2019   Hematemesis 12/18/2019   Pain in right knee 12/14/2018   PAF (paroxysmal atrial fibrillation) 10/09/2018   Dorsalgia 09/22/2018   Dysphagia 09/22/2018   GERD without esophagitis 09/22/2018   Irritable bowel syndrome without diarrhea 09/22/2018   Osteoarthritis of knee 09/22/2018   Polypharmacy 09/22/2018   Sciatica of right side 09/22/2018   Vitamin D deficiency 09/22/2018   Mixed hyperlipidemia 05/07/2016   Chronic atrial fibrillation (HCC) 09/08/2015   Coronary artery disease involving native coronary artery with angina pectoris 03/24/2015   Essential hypertension 03/03/2015   Chest pain 02/11/2015   Non-ST elevation MI (NSTEMI) (HCC) 07/26/2008    PCP: Tamera Punt  REFERRING PROVIDER: Tamera Punt  REFERRING DIAG: acute thoracic back pain  THERAPY DIAG:  Chronic pain of both knees  Muscle weakness (generalized)  Difficulty in walking, not elsewhere classified  Rationale for Evaluation and Treatment: Rehabilitation  ONSET DATE: 11/2022  SUBJECTIVE:   SUBJECTIVE STATEMENT: Pt states her knees have bothered her for years but that they have been worsening lately. She states that 2-3 weeks ago her Lt knee "went out" on her while she was standing in a line. She states she can also hear "crunching" in her knees. Pt also has sciatica down Rt LE due to a fall 3 years ago where she broke her coccyx.  She saw MD who recommended physical therapy. Knee pain increases with prolonged standing and walking, back pain increases with lifting. Pain decreases with use of voltaren gel or meds  PERTINENT HISTORY: Cervical DDD Coccyx fracture PAIN:  Are you having pain? Yes: NPRS scale: 3/10 currently, 7/10 after work/10 Pain location: Rt LE, bilat knees Pain description: burning, aching, sore Aggravating factors: prolonged standing and walking, lifting Relieving factors:  meds  PRECAUTIONS: None  WEIGHT BEARING RESTRICTIONS: No  FALLS:  Has patient fallen in last 6 months? No   OCCUPATION: Conservation officer, nature at [employer], lots of walking, lifting and standing  PLOF: Independent  PATIENT GOALS: be able to walk for exercise, return fully to work with decreased pain  NEXT MD VISIT: July 2024  OBJECTIVE:    MUSCLE LENGTH: Hamstrings: Right WFL deg; Left WFL deg  POSTURE: decreased lumbar lordosis  PALPATION: Pain with lateral patellar mobs Lt knee Crepitus with patellar mobs bilat Pain with palpation Rt glutes near sacrum TTP with UPAs Rt SIJ  Lumbar ROM: flex WFL   Ext 75%   Rt lateral flexion 50% pain   Lt lateral flexion WFL   Rt rotation WFL pain end range   Lt rotation WFL pain end range LOWER EXTREMITY ROM: WFL bilat  LOWER EXTREMITY MMT:  MMT Right eval Left eval  Hip flexion 4- 4 SLR with ER 3/5  Hip  extension 3+ 3+  Hip abduction 4- 4  Hip adduction    Hip internal rotation    Hip external rotation 4 4  Knee flexion 4 4  Knee extension 4 4  Ankle dorsiflexion    Ankle plantarflexion    Ankle inversion    Ankle eversion     (Blank rows = not tested)  LOWER EXTREMITY SPECIAL TESTS:  SLR (-) bilat  GAIT: Distance walked: 100' Assistive device utilized: Single point cane Level of assistance: Modified independence Comments: antalgic gait, decreased cadence   TODAY'S TREATMENT:                                                                                                                              DATE: 12/29/22  See HEP    PATIENT EDUCATION:  Education details: PT POC and goals, HEP Person educated: Patient Education method: Explanation, Demonstration, and Handouts Education comprehension: verbalized understanding and returned demonstration  HOME EXERCISE PROGRAM: Access Code: [email or web address] URL: [email or web address] Date: 12/29/2022 Prepared by: Reggy Eye  Exercises - Figure 4 Bridge  -  1 x daily - 7 x weekly - 2 sets - 10 reps - Hip Extension with Resistance Loop  - 1 x daily - 7 x weekly - 3 sets - 10 reps - Diagonal Hip Extension with Resistance  - 1 x daily - 7 x weekly - 3 sets - 10 reps - Straight Leg Raise with External Rotation  - 1 x daily - 7 x weekly - 2 sets - 10 reps  ASSESSMENT:  CLINICAL IMPRESSION: Patient is a 67 y.o. female who was seen today for physical therapy evaluation and treatment for back pain and knee pain. Pt presents with increased pain, impaired gait and balance, decreased strength and ROM and decreased functional activity tolerance. Pt will benefit from skilled PT to address deficits and improve functional mobility with decreased pain.   OBJECTIVE IMPAIRMENTS: decreased activity tolerance, difficulty walking, decreased strength, increased muscle spasms, and pain.   ACTIVITY LIMITATIONS: carrying, lifting, bending, and locomotion level  PARTICIPATION LIMITATIONS: community activity and occupation  PERSONAL FACTORS: Past/current experiences, Profession, and Time since onset of injury/illness/exacerbation are also affecting patient's functional outcome.   REHAB POTENTIAL: Good  CLINICAL DECISION MAKING: Evolving/moderate complexity  EVALUATION COMPLEXITY: Moderate   GOALS: Goals reviewed with patient? Yes  SHORT TERM GOALS: Target date: 01/12/2023   Pt will be independent with initial HEP Baseline: Goal status: INITIAL   LONG TERM GOALS: Target date: 02/09/2023    Pt will be independent with advanced HEP Baseline:  Goal status: INITIAL  2.  Pt will improve bilat LE strength to 4+/5 to improve standing and walking tolerance Baseline:  Goal status: INITIAL  3.  Pt will tolerate lifting 25# floor to chest with good mechanics and pain <= 2/10 Baseline:  Goal status: INITIAL  4.  Pt will tolerate working full shift with knee pain <= 2/10 Baseline:  Goal status: INITIAL  PLAN:  PT FREQUENCY: 2x/week  PT DURATION: 6  weeks  PLANNED INTERVENTIONS: Therapeutic exercises, Therapeutic activity, Neuromuscular re-education, Balance training, Gait training, Patient/Family education, Self Care, Joint mobilization, Aquatic Therapy, Dry Needling, Electrical stimulation, Cryotherapy, Moist heat, Taping, Vasopneumatic device, Traction, Ultrasound, Ionotophoresis 4mg /ml Dexamethasone, Manual therapy, and Re-evaluation  PLAN FOR NEXT SESSION: assess response to HEP, LE and core strength, lifting mechanics   Annielee Jemmott, PT 12/29/2022, 9:40 AM

## 2022-12-30 ENCOUNTER — Ambulatory Visit (INDEPENDENT_AMBULATORY_CARE_PROVIDER_SITE_OTHER): Payer: Medicare Other | Admitting: Psychiatry

## 2022-12-30 ENCOUNTER — Encounter (HOSPITAL_COMMUNITY): Payer: Self-pay | Admitting: Psychiatry

## 2022-12-30 VITALS — BP 122/81 | HR 72 | Ht 62.0 in | Wt 178.0 lb

## 2022-12-30 DIAGNOSIS — F1020 Alcohol dependence, uncomplicated: Secondary | ICD-10-CM | POA: Diagnosis not present

## 2022-12-30 DIAGNOSIS — F411 Generalized anxiety disorder: Secondary | ICD-10-CM | POA: Diagnosis not present

## 2022-12-30 MED ORDER — BUPROPION HCL ER (SR) 100 MG PO TB12: 100.0000 mg | ORAL_TABLET | Freq: Two times a day (BID) | ORAL | 0 refills | Status: DC

## 2022-12-30 NOTE — Progress Notes (Signed)
Psychiatric Initial Adult Assessment   Patient Identification: Lindsay [name] MRN:  [id number] Date of Evaluation:  12/30/2022 Referral Source: primary care Chief Complaint:   Chief Complaint  Patient presents with   Establish Care   Depression   New Patient (Initial Visit)   Visit Diagnosis:    ICD-10-CM   1. Severe alcohol use disorder  F10.20     2. Generalized anxiety disorder  F41.1     3. Alcohol use disorder, moderate, dependence (HCC)  F10.20       History of Present Illness: Patient is a 67 years old currently single Caucasian female referred by primary care physician to establish care for history of bipolar, depression and anxiety.  Patient has been in treatment many years ago but has not been taking any medication for more than 10 years and regarding to her bipolar or depression  Patient gives a long history of episodes of depression which are described as withdrawn decreased energy feeling of hopelessness despair and crying spells which she is experiencing of now including crying spells and feeling sad and withdrawn.  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  She does endorse episodes of increased energy in the past with elevated mood hyperactivity including getting projects done but then she would crash into depression.  She does worry she worries excessively regarding her finances about her future she gets easily distracted and has difficulty sleeping she takes trazodone at times for night sleep  She has had multiple admission in the past for alcohol-related detox and depression.  Her friend was here who helps her financially and for transport and mention 2 years ago she was admitted 2 or 3 times for alcohol-related depression and for detox.  Family states that patient has been mostly unclear or in denial or would not tell whether she is  drinking.  Patient states that she is not drinking for a couple of weeks or more than a month she is not keeping her care any alcohol.  There has been a heavy use and recurrent use of alcohol with increased recurrence or relapse.  She states she will follow-up with AA when she has transport available  As of now she does not endorse craving but says when she gets depressed she has a tendency to drink.  She has been on multiple medication tried around 2004 by Dr. Jennelle Human she states that she does not want to be on medication but then she said probably she was taking medication and drinking and it was not working.  She believes it has not been allergies to medication but she probably did not tolerate or did not want to continue multiple medication allergies are listed here  She agrees to start on some medication and friend mentioned that 2 years ago a combination of Wellbutrin and gabapentin was the best and she did good for 6 months till she started drinking or was not clear about her habits.  She still takes gabapentin 3 mg at night for pain.  She is following with primary care physician regarding her pain condition knee injury and has been on physical rehab with therapy  Aggravating factor: difficult childhood and conflicts with  dad, financial, alcohol use recurrent Modifying factor: friend, cats  Duration adult life  Severity depression  Hospital admission multiple more so related with alcohol use ;  detox and depression  Denies suicide attempt  Past Psychiatric History: depression, bipolar   Previous Psychotropic Medications: Yes  Multiple, including risperdal, lamictal  Substance Abuse History in the last 12 months:  Yes.    Consequences of Substance Abuse: Medical Consequences:  discussed effect of alcohol to depression, judjement  Past Medical History:  Past Medical History:  Diagnosis Date   Abnormal mammogram    Thick Tissue   Acquired trigger finger of right ring  finger 04/20/2022   Arthritis    Bilateral wrist pain 04/20/2022   Bipolar 1 disorder    Cataract    OU   Cervical radiculopathy 10/14/2021   Chalazion of left lower eyelid 04/15/2022   Chest pain 02/11/2015   Chronic atrial fibrillation 09/08/2015   Colon polyp    Compressed cervical disc    Coronary artery disease involving native coronary artery with angina pectoris 03/24/2015   Dorsalgia 09/22/2018   Dysphagia 09/22/2018   Essential hypertension 03/03/2015   Generalized anxiety disorder    GERD without esophagitis 09/22/2018   Hematemesis 12/18/2019   Hepatic steatosis 04/22/2022   History of appendectomy 1963   Hypertensive retinopathy    OU   Irritable bowel syndrome without diarrhea 09/22/2018   Lactic acidosis 12/18/2019   LLQ pain 04/15/2022   Major depressive disorder    Migraine without status migrainosus, not intractable 09/22/2018   Mixed hyperlipidemia 05/07/2016   Non-ST elevation MI (NSTEMI) 07/26/2008   No cardiac catheterization at the time.   Obstructive sleep apnea    no CPAP use   Orthostasis    Osteoarthritis of carpometacarpal (CMC) joint of thumb 04/20/2022   Osteoarthritis of knee 09/22/2018   PAF (paroxysmal atrial fibrillation) 10/09/2018   Pain in joint of right hip 07/03/2020   Pain in right knee 12/14/2018   Recurrent syncope    Rib pain 07/03/2020   Sciatica of right side 09/22/2018   Severe alcohol use disorder    Vitamin D deficiency     Past Surgical History:  Procedure Laterality Date   APPENDECTOMY     CARDIAC CATHETERIZATION N/A 03/25/2015   Procedure: Left Heart Cath and Coronary Angiography;  Surgeon: Marykay Lex, MD;  Location: [facility] INVASIVE CV LAB;  Service: Cardiovascular;  Laterality: N/A;   CORONARY STENT INTERVENTION N/A 10/09/2018   Procedure: CORONARY STENT INTERVENTION;  Surgeon: Corky Crafts, MD;  Location: [facility] INVASIVE CV LAB;  Service: Cardiovascular;  Laterality: N/A;   DILATATION & CURETTAGE/HYSTEROSCOPY  WITH MYOSURE N/A 05/19/2016   Procedure: DILATATION & CURETTAGE/HYSTEROSCOPY WITH MYOSURE;  Surgeon: Geryl Rankins, MD;  Location: [facility] OR[facility];  Service: Gynecology;  Laterality: N/A;  Possible Myosure for polyps.   EYE SURGERY     LEFT HEART CATH AND CORONARY ANGIOGRAPHY N/A 10/09/2018   Procedure: LEFT HEART CATH AND CORONARY ANGIOGRAPHY;  Surgeon: Corky Crafts, MD;  [facility];  Service: Cardiovascular;  Laterality: N/A;   MOUTH SURGERY     URETHRAL DILATION      Family Psychiatric History: cousin some mental illness, father possible depression   Family History:  Family History  Problem Relation Age of Onset   Vascular Disease Mother    Dementia Mother    Hypertension Mother    Stroke Mother    Depression Mother    Dementia Father    Heart attack Father    Stroke Father    Heart attack Maternal Grandmother    Heart attack Maternal Grandfather    Kidney failure Paternal Grandmother  Heart attack Paternal Grandfather     Social History:   Social History   Socioeconomic History   Marital status: Single    Spouse name: Not on file   Number of children: 0   Years of education: 16   Highest education level: Bachelor's degree (e.g., BA, AB, BS)  Occupational History   Not on file  Tobacco Use   Smoking status: Never   Smokeless tobacco: Never  Vaping Use   Vaping Use: Never used  Substance and Sexual Activity   Alcohol use: Not Currently    Comment: hx of severe alcohol abuse; most recent admission 02/2022   Drug use: Never   Sexual activity: Not on file  Other Topics Concern   Not on file  Social History Narrative   Right handed   Drinks caffeine   Split level   Social Determinants of Health   Financial Resource Strain: High Risk (10/26/2022)   Overall Financial Resource Strain (CARDIA)    Difficulty of Paying Living Expenses: Hard  Food Insecurity: Food Insecurity Present (10/26/2022)   Hunger Vital Sign    Worried About Running Out of  Food in the Last Year: Often true    Ran Out of Food in the Last Year: Sometimes true  Transportation Needs: Unmet Transportation Needs (10/26/2022)   PRAPARE - Administrator, Civil Service (Medical): Yes    Lack of Transportation (Non-Medical): Yes  Physical Activity: Unknown (10/26/2022)   Exercise Vital Sign    Days of Exercise per Week: 0 days    Minutes of Exercise per Session: Not on file  Stress: Stress Concern Present (10/26/2022)   [employer] of Occupational Health - Occupational Stress Questionnaire    Feeling of Stress : Very much  Social Connections: Socially Isolated (10/26/2022)   Social Connection and Isolation Panel [NHANES]    Frequency of Communication with Friends and Family: More than three times a week    Frequency of Social Gatherings with Friends and Family: Once a week    Attends Religious Services: Never    Database administrator or Organizations: No    Attends Engineer, structural: Not on file    Marital Status: Never married    Additional Social History: grew up with parents, rough growing up or challenging with some conflicts with parents  Allergies:   Allergies  Allergen Reactions   Sulfa Antibiotics Swelling and Rash   Ciprofloxacin Nausea And Vomiting   Depakote [Divalproex Sodium] Other (See Comments)    Reaction not recalled- "it was 20 years ago"   Pine Itching   Risperidone Other (See Comments)    Unknown reaction   Seroquel [Quetiapine Fumarate] Other (See Comments)    VIVID nightmares   Statins Other (See Comments)    joint pain, weakness   Tramadol Other (See Comments)    Serotonin syndrome   Citalopram Rash and Other (See Comments)    Insomnia and agitation also   Lamotrigine Itching, Rash and Other (See Comments)    Stevens-Johnson syndrome and Brown urine, also   Other Other (See Comments) and Cough    Cat dander   Topiramate Rash and Other (See Comments)    Nightmares, brown urine also   Wasp Venom  Dermatitis    Edema and redness in area of wasp sting   Zetia [Ezetimibe]     Joint pain, weakness     Metabolic Disorder Labs: No results found for: "HGBA1C", "MPG" No results found for: "PROLACTIN" Lab Results  Component Value Date   CHOL 216 (H) 10/27/2022   TRIG 119 10/27/2022   HDL 77 10/27/2022   CHOLHDL 2.8 10/27/2022   VLDL 44 (H) 04/02/2016   LDLCALC 116 (H) 10/27/2022   LDLCALC 210 (H) 11/17/2020   Lab Results  Component Value Date   TSH 1.134 12/18/2019    Therapeutic Level Labs: No results found for: "LITHIUM" No results found for: "CBMZ" No results found for: "VALPROATE"  Current Medications: Current Outpatient Medications  Medication Sig Dispense Refill   acetaminophen (TYLENOL) 325 MG tablet Take 2 tablets (650 mg total) by mouth every 6 (six) hours as needed for mild pain (or Fever >/= 101).     amLODipine (NORVASC) 2.5 MG tablet Take 1 tablet (2.5 mg total) by mouth daily. 90 tablet 1   Cholecalciferol 20 MCG (800 UNIT) TABS Take 1 tablet by mouth daily. 75 tablet 0   clopidogrel (PLAVIX) 75 MG tablet Take 1 tablet (75 mg total) by mouth daily. 90 tablet 3   Cyanocobalamin (B-12 PO) Take 1 tablet by mouth daily.     cyclobenzaprine (FLEXERIL) 5 MG tablet Take 1 tablet (5 mg total) by mouth at bedtime. 30 tablet 0   diclofenac Sodium (VOLTAREN) 1 % GEL Apply topically as needed.     esomeprazole (NEXIUM 24HR CLEAR MINIS) 20 MG capsule Take 1 capsule (20 mg total) by mouth daily before breakfast. 30 capsule 1   Evolocumab (REPATHA SURECLICK) 140 MG/ML SOAJ Inject 140 mg into the skin every 14 (fourteen) days. 6 mL 3   gabapentin (NEURONTIN) 100 MG capsule Take 3 capsules by mouth at bedtime.     HYDROcodone-acetaminophen (NORCO/VICODIN) 5-325 MG tablet Take 2 tablets by mouth as needed for moderate pain.     losartan (COZAAR) 50 MG tablet TAKE ONE TABLET BY MOUTH ONE TIME DAILY 90 tablet 2   methylPREDNISolone (MEDROL DOSEPAK) 4 MG TBPK tablet Take as  instructed on pill pack 21 tablet 0   metoprolol tartrate (LOPRESSOR) 25 MG tablet TAKE ONE-HALF TABLET BY MOUTH TWICE A DAY . 90 tablet 3   nitroGLYCERIN (NITROSTAT) 0.4 MG SL tablet Place 1 tablet (0.4 mg total) under the tongue as needed for chest pain. 25 tablet 5   Omega-3 Fatty Acids (OMEGA 3 PO) Take 1 capsule by mouth daily.     Plant Sterols and Stanols (CHOLESTOFF PLUS PO) Take by mouth daily.     tiZANidine (ZANAFLEX) 4 MG capsule Take 4 mg by mouth 3 (three) times daily.     traZODone (DESYREL) 50 MG tablet Take 25 mg by mouth at bedtime.     B Complex-C (SUPER B COMPLEX PO) Take by mouth. (Patient not taking: Reported on 12/30/2022)     BIOTIN PO Take 1 tablet by mouth daily. (Patient not taking: Reported on 12/30/2022)     buPROPion (WELLBUTRIN XL) 150 MG 24 hr tablet TAKE ONE TABLET BY MOUTH ONE TIME DAILY (Patient not taking: Reported on 12/30/2022) 30 tablet 4   Probiotic Product (PROBIOTIC ADVANCED PO) Take by mouth daily. (Patient not taking: Reported on 12/30/2022)     No current facility-administered medications for this visit.      Psychiatric Specialty Exam: Review of Systems  Cardiovascular:  Negative for chest pain.  Neurological:  Negative for tremors.  Psychiatric/Behavioral:  Negative for agitation and self-injury.     Blood pressure 122/81, pulse 72, height 5\' 2"  (1.575 m), weight 178 lb (80.7 kg).Body mass index is 32.56 kg/m.  General Appearance: Casual  Eye  Contact:  Fair  Speech:  Clear and Coherent  Volume:  Normal  Mood:  Dysphoric  Affect:  Constricted  Thought Process:  Goal Directed  Orientation:  oriented fully  Thought Content:  Rumination  Suicidal Thoughts:  No  Homicidal Thoughts:  No  Memory:  Immediate;   Fair  Judgement:  Fair  Insight:  Shallow  Psychomotor Activity:  Normal  Concentration:  Concentration: Fair  Recall:  Fair  Fund of Knowledge:Fair  Language: Fair  Akathisia:  No  Handed:    AIMS (if indicated):  no involuntary  movements  Assets:  Desire for Improvement Social Support  ADL's:  Intact  Cognition: WNL  Sleep:  Fair   Screenings: GAD-7    Flowsheet Row Office Visit from 10/27/2022 in [facility] Primary Care & Sports Medicine at [facility] Office Visit from 06/29/2022 in [facility] Primary Care & Sports Medicine at [facility]  Total GAD-7 Score 5 19      PHQ2-9    Flowsheet Row Office Visit from 12/30/2022 in [facility] at [facility] Office Visit from 11/04/2022 in [facility] Primary Care & Sports Medicine at [facility] Office Visit from 10/27/2022 in [facility] Primary Care & Sports Medicine at [facility] Office Visit from 06/29/2022 in [facility] Primary Care & Sports Medicine at [facility] Total Score 2 5 3 6   PHQ-9 Total Score 9 -- 15 21      Flowsheet Row Office Visit from 12/30/2022 in [facility] at [facility] ED from 07/28/2022 in [facility] at [facility] ED from 06/10/2022 in [facility] at [facility] RISK CATEGORY Error: Question 1 not populated Error: Question 6 not populated No Risk       Assessment and Plan: as follows  Bipolar disorder ; current episode depression:  Friend states wellbutrin combing with gaba has helped tremendously 2 years ago but then she got unclear of alcohol use or was using and it didn't work. Will restart wellbutrin 100mg  and continue gaba for bipolar coverage  GAD: is on gaba, will continue and refer to therapy to work on coping skills  Alcohol use disorder;  discussed abstinence and effect on meds, judjement depression. Has been unclear of use to friend and in denial in the past, discussed meds would work when there is abstinence, consider AA but says does not have transport Patient is to note that she has been in denial or sometimes not clear about her habit she agrees to be  straightforward and will citalopram in case there is any relapse of alcohol use.  She again understands the risk of using alcohol recurrence of depression and medication would not be effective  Continue gabapentin from her provider as that would be helpful for her possible underlying bipolar disorder  There is no clear of manic symptoms in the past but there has been some symptoms suggestive of hypomania but no psychosis or clear manic symptoms  We did discuss alcohol effect of memory and can cause disturbance in sleep to work on sleep hygiene can now continue as of now for trazodone to   FU 31m or earlier if needed, recommend therapy Direct care time 60 min including chart review, face to face, documentation   Collaboration of Care: Primary Care Provider AEB reviewed notes and referral  Patient/Guardian was advised Release of Information must be obtained prior to any record release in order to collaborate their care  with an outside provider. Patient/Guardian was advised if they have not already done so to contact the registration department to sign all necessary forms in order for [place] to release information regarding their care.   Consent: Patient/Guardian gives verbal consent for treatment and assignment of benefits for services provided during this visit. Patient/Guardian expressed understanding and agreed to proceed.   Thresa Ross, MD 6/6/20249:16 AM

## 2023-01-04 ENCOUNTER — Telehealth: Payer: Self-pay

## 2023-01-04 ENCOUNTER — Ambulatory Visit: Payer: Medicare Other | Admitting: Physical Therapy

## 2023-01-04 ENCOUNTER — Telehealth: Payer: Self-pay | Admitting: *Deleted

## 2023-01-04 ENCOUNTER — Encounter: Payer: Self-pay | Admitting: Physical Therapy

## 2023-01-04 DIAGNOSIS — M546 Pain in thoracic spine: Secondary | ICD-10-CM | POA: Diagnosis not present

## 2023-01-04 DIAGNOSIS — M25561 Pain in right knee: Secondary | ICD-10-CM | POA: Diagnosis not present

## 2023-01-04 DIAGNOSIS — M6281 Muscle weakness (generalized): Secondary | ICD-10-CM | POA: Diagnosis not present

## 2023-01-04 DIAGNOSIS — R262 Difficulty in walking, not elsewhere classified: Secondary | ICD-10-CM | POA: Diagnosis not present

## 2023-01-04 DIAGNOSIS — M25562 Pain in left knee: Secondary | ICD-10-CM | POA: Diagnosis not present

## 2023-01-04 DIAGNOSIS — G8929 Other chronic pain: Secondary | ICD-10-CM | POA: Diagnosis not present

## 2023-01-04 NOTE — Telephone Encounter (Signed)
Pt has been scheduled for tele pre op appt 01/12/23 @ 2:40. Med rec and consent are done.     Patient Consent for Virtual Visit        Lindsay [name] has provided verbal consent on 01/04/2023 for a virtual visit (video or telephone).   CONSENT FOR VIRTUAL VISIT FOR:  Lindsay [name]  By participating in this virtual visit I agree to the following:  I hereby voluntarily request, consent and authorize [facility] and its employed or contracted physicians, physician assistants, nurse practitioners or other licensed health care professionals (the Practitioner), to provide me with telemedicine health care services (the "Services") as deemed necessary by the treating Practitioner. I acknowledge and consent to receive the Services by the Practitioner via telemedicine. I understand that the telemedicine visit will involve communicating with the Practitioner through live audiovisual communication technology and the disclosure of certain medical information by electronic transmission. I acknowledge that I have been given the opportunity to request an in-person assessment or other available alternative prior to the telemedicine visit and am voluntarily participating in the telemedicine visit.  I understand that I have the right to withhold or withdraw my consent to the use of telemedicine in the course of my care at any time, without affecting my right to future care or treatment, and that the Practitioner or I may terminate the telemedicine visit at any time. I understand that I have the right to inspect all information obtained and/or recorded in the course of the telemedicine visit and may receive copies of available information for a reasonable fee.  I understand that some of the potential risks of receiving the Services via telemedicine include:  Delay or interruption in medical evaluation due to technological equipment failure or disruption; Information transmitted may not be sufficient (e.g. poor  resolution of images) to allow for appropriate medical decision making by the Practitioner; and/or  In rare instances, security protocols could fail, causing a breach of personal health information.  Furthermore, I acknowledge that it is my responsibility to provide information about my medical history, conditions and care that is complete and accurate to the best of my ability. I acknowledge that Practitioner's advice, recommendations, and/or decision may be based on factors not within their control, such as incomplete or inaccurate data provided by me or distortions of diagnostic images or specimens that may result from electronic transmissions. I understand that the practice of medicine is not an exact science and that Practitioner makes no warranties or guarantees regarding treatment outcomes. I acknowledge that a copy of this consent can be made available to me via my patient portal [facility] MyChart), or I can request a printed copy by calling the office of [facility].    I understand that my insurance will be billed for this visit.   I have read or had this consent read to me. I understand the contents of this consent, which adequately explains the benefits and risks of the Services being provided via telemedicine.  I have been provided ample opportunity to ask questions regarding this consent and the Services and have had my questions answered to my satisfaction. I give my informed consent for the services to be provided through the use of telemedicine in my medical care

## 2023-01-04 NOTE — Telephone Encounter (Signed)
   Name: Lindsay [name]  DOB: Sep 13, 1955  MRN: [id number]  Primary Cardiologist: Chilton Si, MD  Chart reviewed as part of pre-operative protocol coverage. Because of [name] past medical history and time since last visit, she will require a follow-up telephone visit in order to better assess preoperative cardiovascular risk.  Pre-op covering staff: - Please schedule appointment and call patient to inform them. If patient already had an upcoming appointment within acceptable timeframe, please add "pre-op clearance" to the appointment notes so provider is aware. - Please contact requesting surgeon's office via preferred method (i.e, phone, fax) to inform them of need for appointment prior to surgery.  Should be okay to hold Plavix 7 days prior to injection as long as no cardiac related symptoms at the time of phone call  Sharlene Dory, PA-C  01/04/2023, 11:41 AM

## 2023-01-04 NOTE — Telephone Encounter (Signed)
   Pre-operative Risk Assessment    Patient Name: Lindsay [name]  DOB: 12/19/1955 MRN: [id number]      Request for Surgical Clearance    Procedure:   CERVICAL EPIDURAL STEROID INJECTION  Date of Surgery:  Clearance 01/18/23                                 Surgeon:  Brent General LISTED Surgeon's Group or Practice Name:  [facility] Phone number:  [phone] Fax number:  [phone]   Type of Clearance Requested:   - Pharmacy:  Hold Clopidogrel (Plavix) 7 DAYS PRIOR   Type of Anesthesia:  Not Indicated   Additional requests/questions:    SignedMichaelle Copas   01/04/2023, 9:09 AM

## 2023-01-04 NOTE — Telephone Encounter (Signed)
Pt has been scheduled for tele pre op appt 01/12/23 @ 2:40. Med rec and consent are done.

## 2023-01-04 NOTE — Therapy (Signed)
OUTPATIENT PHYSICAL THERAPY LOWER EXTREMITY EVALUATION   Patient Name: Lindsay [name] MRN: [id number] DOB:Nov 17, 1955, 67 y.o., female Today's Date: 01/04/2023  END OF SESSION:  PT End of Session - 01/04/23 1533     Visit Number 2    Number of Visits 12    Date for PT Re-Evaluation 02/09/23    Authorization Type Medicare    Progress Note Due on Visit 10    PT Start Time 1533    PT Stop Time 1615    PT Time Calculation (min) 42 min    Activity Tolerance Patient tolerated treatment well    Behavior During Therapy [facility] for tasks assessed/performed             Past Medical History:  Diagnosis Date   Abnormal mammogram    Thick Tissue   Acquired trigger finger of right ring finger 04/20/2022   Arthritis    Bilateral wrist pain 04/20/2022   Bipolar 1 disorder    Cataract    OU   Cervical radiculopathy 10/14/2021   Chalazion of left lower eyelid 04/15/2022   Chest pain 02/11/2015   Chronic atrial fibrillation 09/08/2015   Colon polyp    Compressed cervical disc    Coronary artery disease involving native coronary artery with angina pectoris 03/24/2015   Dorsalgia 09/22/2018   Dysphagia 09/22/2018   Essential hypertension 03/03/2015   Generalized anxiety disorder    GERD without esophagitis 09/22/2018   Hematemesis 12/18/2019   Hepatic steatosis 04/22/2022   History of appendectomy 1963   Hypertensive retinopathy    OU   Irritable bowel syndrome without diarrhea 09/22/2018   Lactic acidosis 12/18/2019   LLQ pain 04/15/2022   Major depressive disorder    Migraine without status migrainosus, not intractable 09/22/2018   Mixed hyperlipidemia 05/07/2016   Non-ST elevation MI (NSTEMI) 07/26/2008   No cardiac catheterization at the time.   Obstructive sleep apnea    no CPAP use   Orthostasis    Osteoarthritis of carpometacarpal (CMC) joint of thumb 04/20/2022   Osteoarthritis of knee 09/22/2018   PAF (paroxysmal atrial fibrillation) 10/09/2018   Pain in joint of right  hip 07/03/2020   Pain in right knee 12/14/2018   Recurrent syncope    Rib pain 07/03/2020   Sciatica of right side 09/22/2018   Severe alcohol use disorder    Vitamin D deficiency    Past Surgical History:  Procedure Laterality Date   APPENDECTOMY     CARDIAC CATHETERIZATION N/A 03/25/2015   Procedure: Left Heart Cath and Coronary Angiography;  Surgeon: Marykay Lex, MD;  Location: [facility] INVASIVE CV LAB;  Service: Cardiovascular;  Laterality: N/A;   CORONARY STENT INTERVENTION N/A 10/09/2018   Procedure: CORONARY STENT INTERVENTION;  Surgeon: Corky Crafts, MD;  Location: [facility] INVASIVE CV LAB;  Service: Cardiovascular;  Laterality: N/A;   DILATATION & CURETTAGE/HYSTEROSCOPY WITH MYOSURE N/A 05/19/2016   Procedure: DILATATION & CURETTAGE/HYSTEROSCOPY WITH MYOSURE;  Surgeon: Geryl Rankins, MD;  Location: [facility];  Service: Gynecology;  Laterality: N/A;  Possible Myosure for polyps.   EYE SURGERY     LEFT HEART CATH AND CORONARY ANGIOGRAPHY N/A 10/09/2018   Procedure: LEFT HEART CATH AND CORONARY ANGIOGRAPHY;  Surgeon: Corky Crafts, MD;  Location: [facility] INVASIVE CV LAB;  Service: Cardiovascular;  Laterality: N/A;   MOUTH SURGERY     URETHRAL DILATION     Patient Active Problem List   Diagnosis Date Noted   Blood clotting disorder (HCC) 10/28/2022   Neck pain 10/27/2022  Acute thoracic back pain 10/27/2022   Lumbar radiculopathy 05/26/2022   Degeneration of lumbar intervertebral disc 05/26/2022   Major depressive disorder    Severe alcohol use disorder    Generalized anxiety disorder    Obstructive sleep apnea    Hepatic steatosis 04/22/2022   Osteoarthritis of carpometacarpal (CMC) joint of thumb 04/20/2022   Acquired trigger finger of right ring finger 04/20/2022   Bilateral wrist pain 04/20/2022   Chalazion of left lower eyelid 04/15/2022   LLQ pain 04/15/2022   Bipolar 1 disorder (HCC)    Cervical radiculopathy 10/14/2021   Pain in joint of right hip 07/03/2020    Rib pain 07/03/2020   Recurrent syncope    Orthostasis    Lactic acidosis 12/18/2019   Hematemesis 12/18/2019   Pain in right knee 12/14/2018   PAF (paroxysmal atrial fibrillation) 10/09/2018   Dorsalgia 09/22/2018   Dysphagia 09/22/2018   GERD without esophagitis 09/22/2018   Irritable bowel syndrome without diarrhea 09/22/2018   Osteoarthritis of knee 09/22/2018   Polypharmacy 09/22/2018   Sciatica of right side 09/22/2018   Vitamin D deficiency 09/22/2018   Mixed hyperlipidemia 05/07/2016   Chronic atrial fibrillation (HCC) 09/08/2015   Coronary artery disease involving native coronary artery with angina pectoris 03/24/2015   Essential hypertension 03/03/2015   Chest pain 02/11/2015   Non-ST elevation MI (NSTEMI) (HCC) 07/26/2008    PCP: Tamera Punt  REFERRING PROVIDER: Tamera Punt  REFERRING DIAG: acute thoracic back pain  THERAPY DIAG:  Chronic pain of both knees  Muscle weakness (generalized)  Difficulty in walking, not elsewhere classified  Rationale for Evaluation and Treatment: Rehabilitation  ONSET DATE: 11/2022  SUBJECTIVE:   SUBJECTIVE STATEMENT: Pt states increased R medial lower leg pain lately. Pt has not been able to do the exercises.   PERTINENT HISTORY: Cervical DDD Coccyx fracture From eval: Pt states her knees have bothered her for years but that they have been worsening lately. She states that 2-3 weeks ago her Lt knee "went out" on her while she was standing in a line. She states she can also hear "crunching" in her knees. Pt also has sciatica down Rt LE due to a fall 3 years ago where she broke her coccyx.  She saw MD who recommended physical therapy. Knee pain increases with prolonged standing and walking, back pain increases with lifting. Pain decreases with use of voltaren gel or meds  PAIN:  Are you having pain? Yes: NPRS scale: 2/10 currently, 7/10 after work/10 Pain location: Rt LE, bilat knees Pain description: burning, aching, sore Aggravating  factors: prolonged standing and walking, lifting Relieving factors: meds  PRECAUTIONS: None  WEIGHT BEARING RESTRICTIONS: No  FALLS:  Has patient fallen in last 6 months? No  OCCUPATION: Conservation officer, nature at [employer], lots of walking, lifting and standing  PLOF: Independent  PATIENT GOALS: be able to walk for exercise, return fully to work with decreased pain  NEXT MD VISIT: July 2024  OBJECTIVE: (Measures in this section from initial evaluation unless otherwise noted)   MUSCLE LENGTH: Hamstrings: Right WFL deg; Left WFL deg  POSTURE: decreased lumbar lordosis  PALPATION: Pain with lateral patellar mobs Lt knee Crepitus with patellar mobs bilat Pain with palpation Rt glutes near sacrum TTP with UPAs Rt SIJ  Lumbar ROM: flex WFL   Ext 75%   Rt lateral flexion 50% pain   Lt lateral flexion WFL   Rt rotation WFL pain end range   Lt rotation WFL pain end range  LOWER EXTREMITY ROM: [facility]  bilat  LOWER EXTREMITY MMT:  MMT Right eval Left eval  Hip flexion 4- 4 SLR with ER 3/5  Hip extension 3+ 3+  Hip abduction 4- 4  Hip adduction    Hip internal rotation    Hip external rotation 4 4  Knee flexion 4 4  Knee extension 4 4  Ankle dorsiflexion    Ankle plantarflexion    Ankle inversion    Ankle eversion     (Blank rows = not tested)  LOWER EXTREMITY SPECIAL TESTS:  SLR (-) bilat  GAIT: Distance walked: 100' Assistive device utilized: Single point cane Level of assistance: Modified independence Comments: antalgic gait, decreased cadence   TODAY'S TREATMENT:         [facility] Adult PT Treatment:                                                DATE: 01/04/23 Therapeutic Exercise: Nustep L5 x 5 min UEs/LEs Figure 4 bridge 2x10 SLR + ER 2x10 PPT + marching 2x10 Standing hip abduction red TB 2x10 Standing hip ext red TB 2x10 Manual Therapy: STM & TPR post tib Self Care: Standing without keeping knees locked, work ergonomics                                                                                                                          DATE: 12/29/22  See HEP    PATIENT EDUCATION:  Education details: PT POC and goals, HEP Person educated: Patient Education method: Explanation, Demonstration, and Handouts Education comprehension: verbalized understanding and returned demonstration  HOME EXERCISE PROGRAM: Access Code: [email or web address] URL: [email or web address] Date: 12/29/2022 Prepared by: Reggy Eye  Exercises - Figure 4 Bridge  - 1 x daily - 7 x weekly - 2 sets - 10 reps - Hip Extension with Resistance Loop  - 1 x daily - 7 x weekly - 3 sets - 10 reps - Diagonal Hip Extension with Resistance  - 1 x daily - 7 x weekly - 3 sets - 10 reps - Straight Leg Raise with External Rotation  - 1 x daily - 7 x weekly - 2 sets - 10 reps  ASSESSMENT:  CLINICAL IMPRESSION: Reviewed HEP. Added core exercise. Pt tolerated hip strengthening well. Discussed keeping from locking knees to decrease pressure.   OBJECTIVE IMPAIRMENTS: decreased activity tolerance, difficulty walking, decreased strength, increased muscle spasms, and pain.   ACTIVITY LIMITATIONS: carrying, lifting, bending, and locomotion level  PARTICIPATION LIMITATIONS: community activity and occupation  PERSONAL FACTORS: Past/current experiences, Profession, and Time since onset of injury/illness/exacerbation are also affecting patient's functional outcome.   REHAB POTENTIAL: Good  CLINICAL DECISION MAKING: Evolving/moderate complexity  EVALUATION COMPLEXITY: Moderate   GOALS: Goals reviewed with patient? Yes  SHORT TERM GOALS: Target date: 01/12/2023   Pt will be independent with initial HEP Baseline: Goal status: INITIAL  LONG TERM GOALS: Target date: 02/09/2023    Pt will be independent with advanced HEP Baseline:  Goal status: INITIAL  2.  Pt will improve bilat LE strength to 4+/5 to improve standing and walking tolerance Baseline:  Goal status:  INITIAL  3.  Pt will tolerate lifting 25# floor to chest with good mechanics and pain <= 2/10 Baseline:  Goal status: INITIAL  4.  Pt will tolerate working full shift with knee pain <= 2/10 Baseline:  Goal status: INITIAL    PLAN:  PT FREQUENCY: 2x/week  PT DURATION: 6 weeks  PLANNED INTERVENTIONS: Therapeutic exercises, Therapeutic activity, Neuromuscular re-education, Balance training, Gait training, Patient/Family education, Self Care, Joint mobilization, Aquatic Therapy, Dry Needling, Electrical stimulation, Cryotherapy, Moist heat, Taping, Vasopneumatic device, Traction, Ultrasound, Ionotophoresis 4mg /ml Dexamethasone, Manual therapy, and Re-evaluation  PLAN FOR NEXT SESSION: assess response to HEP, LE and core strength, lifting mechanics   Ayven Glasco April Ma L Saron Tweed, PT 01/04/2023, 3:33 PM

## 2023-01-06 ENCOUNTER — Ambulatory Visit (HOSPITAL_COMMUNITY): Payer: Self-pay | Admitting: Student in an Organized Health Care Education/Training Program

## 2023-01-06 ENCOUNTER — Encounter: Payer: Self-pay | Admitting: Physical Therapy

## 2023-01-06 ENCOUNTER — Ambulatory Visit: Payer: Medicare Other | Admitting: Physical Therapy

## 2023-01-06 DIAGNOSIS — M25562 Pain in left knee: Secondary | ICD-10-CM | POA: Diagnosis not present

## 2023-01-06 DIAGNOSIS — M6281 Muscle weakness (generalized): Secondary | ICD-10-CM | POA: Diagnosis not present

## 2023-01-06 DIAGNOSIS — G8929 Other chronic pain: Secondary | ICD-10-CM | POA: Diagnosis not present

## 2023-01-06 DIAGNOSIS — M546 Pain in thoracic spine: Secondary | ICD-10-CM | POA: Diagnosis not present

## 2023-01-06 DIAGNOSIS — M25561 Pain in right knee: Secondary | ICD-10-CM | POA: Diagnosis not present

## 2023-01-06 DIAGNOSIS — R262 Difficulty in walking, not elsewhere classified: Secondary | ICD-10-CM

## 2023-01-06 NOTE — Therapy (Signed)
OUTPATIENT PHYSICAL THERAPY LOWER EXTREMITY TREATMENT   Patient Name: Lindsay [name] MRN: [id number] DOB:04-11-1956, 67 y.o., female Today's Date: 01/06/2023  END OF SESSION:  PT End of Session - 01/06/23 1539     Visit Number 3    Number of Visits 12    Date for PT Re-Evaluation 02/09/23    Authorization Type Medicare    Progress Note Due on Visit 10    PT Start Time 1535    PT Stop Time 1615    PT Time Calculation (min) 40 min    Activity Tolerance Patient tolerated treatment well    Behavior During Therapy [facility] for tasks assessed/performed             Past Medical History:  Diagnosis Date   Abnormal mammogram    Thick Tissue   Acquired trigger finger of right ring finger 04/20/2022   Arthritis    Bilateral wrist pain 04/20/2022   Bipolar 1 disorder    Cataract    OU   Cervical radiculopathy 10/14/2021   Chalazion of left lower eyelid 04/15/2022   Chest pain 02/11/2015   Chronic atrial fibrillation 09/08/2015   Colon polyp    Compressed cervical disc    Coronary artery disease involving native coronary artery with angina pectoris 03/24/2015   Dorsalgia 09/22/2018   Dysphagia 09/22/2018   Essential hypertension 03/03/2015   Generalized anxiety disorder    GERD without esophagitis 09/22/2018   Hematemesis 12/18/2019   Hepatic steatosis 04/22/2022   History of appendectomy 1963   Hypertensive retinopathy    OU   Irritable bowel syndrome without diarrhea 09/22/2018   Lactic acidosis 12/18/2019   LLQ pain 04/15/2022   Major depressive disorder    Migraine without status migrainosus, not intractable 09/22/2018   Mixed hyperlipidemia 05/07/2016   Non-ST elevation MI (NSTEMI) 07/26/2008   No cardiac catheterization at the time.   Obstructive sleep apnea    no CPAP use   Orthostasis    Osteoarthritis of carpometacarpal (CMC) joint of thumb 04/20/2022   Osteoarthritis of knee 09/22/2018   PAF (paroxysmal atrial fibrillation) 10/09/2018   Pain in joint of right  hip 07/03/2020   Pain in right knee 12/14/2018   Recurrent syncope    Rib pain 07/03/2020   Sciatica of right side 09/22/2018   Severe alcohol use disorder    Vitamin D deficiency    Past Surgical History:  Procedure Laterality Date   APPENDECTOMY     CARDIAC CATHETERIZATION N/A 03/25/2015   Procedure: Left Heart Cath and Coronary Angiography;  Surgeon: Marykay Lex, MD;  Location: [facility] INVASIVE CV LAB;  Service: Cardiovascular;  Laterality: N/A;   CORONARY STENT INTERVENTION N/A 10/09/2018   Procedure: CORONARY STENT INTERVENTION;  Surgeon: Corky Crafts, MD;  Location: [facility] INVASIVE CV LAB;  Service: Cardiovascular;  Laterality: N/A;   DILATATION & CURETTAGE/HYSTEROSCOPY WITH MYOSURE N/A 05/19/2016   Procedure: DILATATION & CURETTAGE/HYSTEROSCOPY WITH MYOSURE;  Surgeon: Geryl Rankins, MD;  Location: [facility];  Service: Gynecology;  Laterality: N/A;  Possible Myosure for polyps.   EYE SURGERY     LEFT HEART CATH AND CORONARY ANGIOGRAPHY N/A 10/09/2018   Procedure: LEFT HEART CATH AND CORONARY ANGIOGRAPHY;  Surgeon: Corky Crafts, MD;  Location: [facility] INVASIVE CV LAB;  Service: Cardiovascular;  Laterality: N/A;   MOUTH SURGERY     URETHRAL DILATION     Patient Active Problem List   Diagnosis Date Noted   Blood clotting disorder (HCC) 10/28/2022   Neck pain 10/27/2022  Acute thoracic back pain 10/27/2022   Lumbar radiculopathy 05/26/2022   Degeneration of lumbar intervertebral disc 05/26/2022   Major depressive disorder    Severe alcohol use disorder    Generalized anxiety disorder    Obstructive sleep apnea    Hepatic steatosis 04/22/2022   Osteoarthritis of carpometacarpal (CMC) joint of thumb 04/20/2022   Acquired trigger finger of right ring finger 04/20/2022   Bilateral wrist pain 04/20/2022   Chalazion of left lower eyelid 04/15/2022   LLQ pain 04/15/2022   Bipolar 1 disorder (HCC)    Cervical radiculopathy 10/14/2021   Pain in joint of right hip 07/03/2020    Rib pain 07/03/2020   Recurrent syncope    Orthostasis    Lactic acidosis 12/18/2019   Hematemesis 12/18/2019   Pain in right knee 12/14/2018   PAF (paroxysmal atrial fibrillation) 10/09/2018   Dorsalgia 09/22/2018   Dysphagia 09/22/2018   GERD without esophagitis 09/22/2018   Irritable bowel syndrome without diarrhea 09/22/2018   Osteoarthritis of knee 09/22/2018   Polypharmacy 09/22/2018   Sciatica of right side 09/22/2018   Vitamin D deficiency 09/22/2018   Mixed hyperlipidemia 05/07/2016   Chronic atrial fibrillation (HCC) 09/08/2015   Coronary artery disease involving native coronary artery with angina pectoris 03/24/2015   Essential hypertension 03/03/2015   Chest pain 02/11/2015   Non-ST elevation MI (NSTEMI) (HCC) 07/26/2008    PCP: Tamera Punt  REFERRING PROVIDER: Tamera Punt  REFERRING DIAG: acute thoracic back pain  THERAPY DIAG:  Chronic pain of both knees  Muscle weakness (generalized)  Difficulty in walking, not elsewhere classified  Rationale for Evaluation and Treatment: Rehabilitation  ONSET DATE: 11/2022  SUBJECTIVE:   SUBJECTIVE STATEMENT: Pt states she had a bad flare up of her knees and back. Reports it is feeling better now.   PERTINENT HISTORY: Cervical DDD Coccyx fracture From eval: Pt states her knees have bothered her for years but that they have been worsening lately. She states that 2-3 weeks ago her Lt knee "went out" on her while she was standing in a line. She states she can also hear "crunching" in her knees. Pt also has sciatica down Rt LE due to a fall 3 years ago where she broke her coccyx.  She saw MD who recommended physical therapy. Knee pain increases with prolonged standing and walking, back pain increases with lifting. Pain decreases with use of voltaren gel or meds  PAIN:  Are you having pain? Yes: NPRS scale: 2/10 currently, 7/10 after work/10 Pain location: Rt LE, bilat knees Pain description: burning, aching, sore Aggravating  factors: prolonged standing and walking, lifting Relieving factors: meds  PRECAUTIONS: None  WEIGHT BEARING RESTRICTIONS: No  FALLS:  Has patient fallen in last 6 months? No  OCCUPATION: Conservation officer, nature at [employer], lots of walking, lifting and standing  PLOF: Independent  PATIENT GOALS: be able to walk for exercise, return fully to work with decreased pain  NEXT MD VISIT: July 2024  OBJECTIVE: (Measures in this section from initial evaluation unless otherwise noted)   MUSCLE LENGTH: Hamstrings: Right WFL deg; Left WFL deg  POSTURE: decreased lumbar lordosis  PALPATION: Pain with lateral patellar mobs Lt knee Crepitus with patellar mobs bilat Pain with palpation Rt glutes near sacrum TTP with UPAs Rt SIJ  Lumbar ROM: flex WFL   Ext 75%   Rt lateral flexion 50% pain   Lt lateral flexion WFL   Rt rotation WFL pain end range   Lt rotation WFL pain end range  LOWER EXTREMITY ROM:  WFL bilat  LOWER EXTREMITY MMT:  MMT Right eval Left eval  Hip flexion 4- 4 SLR with ER 3/5  Hip extension 3+ 3+  Hip abduction 4- 4  Hip adduction    Hip internal rotation    Hip external rotation 4 4  Knee flexion 4 4  Knee extension 4 4  Ankle dorsiflexion    Ankle plantarflexion    Ankle inversion    Ankle eversion     (Blank rows = not tested)  LOWER EXTREMITY SPECIAL TESTS:  SLR (-) bilat  GAIT: Distance walked: 100' Assistive device utilized: Single point cane Level of assistance: Modified independence Comments: antalgic gait, decreased cadence   TODAY'S TREATMENT:         [facility] Adult PT Treatment:                                                DATE: 01/06/23 Therapeutic Exercise: Supine hamstring stretch with strap x 30 sec Supine hip flexor/quad stretch with strap x 30 sec (2 sets on R) Supine piriformis stretch x 30 sec Supine figure 4 bridge 2x10 PPT + marching 2x10 Sidelying clamshell red TB 2x10 Sidelying reverse clam 2x10   [facility] Adult PT Treatment:                                                 DATE: 01/04/23 Therapeutic Exercise: Nustep L5 x 5 min UEs/LEs Figure 4 bridge 2x10 SLR + ER 2x10 PPT + marching 2x10 Standing hip abduction red TB 2x10 Standing hip ext red TB 2x10 Manual Therapy: STM & TPR post tib Self Care: Standing without keeping knees locked, work ergonomics                                                                                                                         DATE: 12/29/22  See HEP    PATIENT EDUCATION:  Education details: PT POC and goals, HEP Person educated: Patient Education method: Explanation, Demonstration, and Handouts Education comprehension: verbalized understanding and returned demonstration  HOME EXERCISE PROGRAM: Access Code: [email or web address] URL: [email or web address] Date: 12/29/2022 Prepared by: Reggy Eye  Exercises - Figure 4 Bridge  - 1 x daily - 7 x weekly - 2 sets - 10 reps - Hip Extension with Resistance Loop  - 1 x daily - 7 x weekly - 3 sets - 10 reps - Diagonal Hip Extension with Resistance  - 1 x daily - 7 x weekly - 3 sets - 10 reps - Straight Leg Raise with External Rotation  - 1 x daily - 7 x weekly - 2 sets - 10 reps  ASSESSMENT:  CLINICAL IMPRESSION: Continued to work on core and hip strengthening. Primarily performed  in non weightbearing position to keep from re-flaring up her pain from last night.   OBJECTIVE IMPAIRMENTS: decreased activity tolerance, difficulty walking, decreased strength, increased muscle spasms, and pain.   ACTIVITY LIMITATIONS: carrying, lifting, bending, and locomotion level  PARTICIPATION LIMITATIONS: community activity and occupation  PERSONAL FACTORS: Past/current experiences, Profession, and Time since onset of injury/illness/exacerbation are also affecting patient's functional outcome.   REHAB POTENTIAL: Good  CLINICAL DECISION MAKING: Evolving/moderate complexity  EVALUATION COMPLEXITY: Moderate   GOALS: Goals  reviewed with patient? Yes  SHORT TERM GOALS: Target date: 01/12/2023   Pt will be independent with initial HEP Baseline: Goal status: MET   LONG TERM GOALS: Target date: 02/09/2023    Pt will be independent with advanced HEP Baseline:  Goal status: INITIAL  2.  Pt will improve bilat LE strength to 4+/5 to improve standing and walking tolerance Baseline:  Goal status: INITIAL  3.  Pt will tolerate lifting 25# floor to chest with good mechanics and pain <= 2/10 Baseline:  Goal status: INITIAL  4.  Pt will tolerate working full shift with knee pain <= 2/10 Baseline:  Goal status: INITIAL    PLAN:  PT FREQUENCY: 2x/week  PT DURATION: 6 weeks  PLANNED INTERVENTIONS: Therapeutic exercises, Therapeutic activity, Neuromuscular re-education, Balance training, Gait training, Patient/Family education, Self Care, Joint mobilization, Aquatic Therapy, Dry Needling, Electrical stimulation, Cryotherapy, Moist heat, Taping, Vasopneumatic device, Traction, Ultrasound, Ionotophoresis 4mg /ml Dexamethasone, Manual therapy, and Re-evaluation  PLAN FOR NEXT SESSION: assess response to HEP, LE and core strength, lifting mechanics   Jada Kuhnert April Ma L Shaquella Stamant, PT 01/06/2023, 3:39 PM

## 2023-01-11 ENCOUNTER — Ambulatory Visit: Payer: Medicare Other | Admitting: Physical Therapy

## 2023-01-11 ENCOUNTER — Encounter: Payer: Self-pay | Admitting: Physical Therapy

## 2023-01-11 DIAGNOSIS — G8929 Other chronic pain: Secondary | ICD-10-CM | POA: Diagnosis not present

## 2023-01-11 DIAGNOSIS — M546 Pain in thoracic spine: Secondary | ICD-10-CM | POA: Diagnosis not present

## 2023-01-11 DIAGNOSIS — R262 Difficulty in walking, not elsewhere classified: Secondary | ICD-10-CM

## 2023-01-11 DIAGNOSIS — M25561 Pain in right knee: Secondary | ICD-10-CM | POA: Diagnosis not present

## 2023-01-11 DIAGNOSIS — M6281 Muscle weakness (generalized): Secondary | ICD-10-CM | POA: Diagnosis not present

## 2023-01-11 DIAGNOSIS — M25562 Pain in left knee: Secondary | ICD-10-CM | POA: Diagnosis not present

## 2023-01-11 NOTE — Therapy (Signed)
OUTPATIENT PHYSICAL THERAPY LOWER EXTREMITY TREATMENT   Patient Name: Lindsay [name] MRN: [id number] DOB:May 06, 1956, 67 y.o., female Today's Date: 01/11/2023  END OF SESSION:  PT End of Session - 01/11/23 1521     Visit Number 4    Number of Visits 12    Date for PT Re-Evaluation 02/09/23    Authorization Type Medicare    Authorization - Visit Number 4    Progress Note Due on Visit 10    PT Start Time 1445    PT Stop Time 1525    PT Time Calculation (min) 40 min    Activity Tolerance Patient tolerated treatment well    Behavior During Therapy [facility] for tasks assessed/performed              Past Medical History:  Diagnosis Date   Abnormal mammogram    Thick Tissue   Acquired trigger finger of right ring finger 04/20/2022   Arthritis    Bilateral wrist pain 04/20/2022   Bipolar 1 disorder    Cataract    OU   Cervical radiculopathy 10/14/2021   Chalazion of left lower eyelid 04/15/2022   Chest pain 02/11/2015   Chronic atrial fibrillation 09/08/2015   Colon polyp    Compressed cervical disc    Coronary artery disease involving native coronary artery with angina pectoris 03/24/2015   Dorsalgia 09/22/2018   Dysphagia 09/22/2018   Essential hypertension 03/03/2015   Generalized anxiety disorder    GERD without esophagitis 09/22/2018   Hematemesis 12/18/2019   Hepatic steatosis 04/22/2022   History of appendectomy 1963   Hypertensive retinopathy    OU   Irritable bowel syndrome without diarrhea 09/22/2018   Lactic acidosis 12/18/2019   LLQ pain 04/15/2022   Major depressive disorder    Migraine without status migrainosus, not intractable 09/22/2018   Mixed hyperlipidemia 05/07/2016   Non-ST elevation MI (NSTEMI) 07/26/2008   No cardiac catheterization at the time.   Obstructive sleep apnea    no CPAP use   Orthostasis    Osteoarthritis of carpometacarpal (CMC) joint of thumb 04/20/2022   Osteoarthritis of knee 09/22/2018   PAF (paroxysmal atrial fibrillation)  10/09/2018   Pain in joint of right hip 07/03/2020   Pain in right knee 12/14/2018   Recurrent syncope    Rib pain 07/03/2020   Sciatica of right side 09/22/2018   Severe alcohol use disorder    Vitamin D deficiency    Past Surgical History:  Procedure Laterality Date   APPENDECTOMY     CARDIAC CATHETERIZATION N/A 03/25/2015   Procedure: Left Heart Cath and Coronary Angiography;  Surgeon: Marykay Lex, MD;  Location: [facility] INVASIVE CV LAB;  Service: Cardiovascular;  Laterality: N/A;   CORONARY STENT INTERVENTION N/A 10/09/2018   Procedure: CORONARY STENT INTERVENTION;  Surgeon: Corky Crafts, MD;  Location: [facility] INVASIVE CV LAB;  Service: Cardiovascular;  Laterality: N/A;   DILATATION & CURETTAGE/HYSTEROSCOPY WITH MYOSURE N/A 05/19/2016   Procedure: DILATATION & CURETTAGE/HYSTEROSCOPY WITH MYOSURE;  Surgeon: Geryl Rankins, MD;  Location: [facility];  Service: Gynecology;  Laterality: N/A;  Possible Myosure for polyps.   EYE SURGERY     LEFT HEART CATH AND CORONARY ANGIOGRAPHY N/A 10/09/2018   Procedure: LEFT HEART CATH AND CORONARY ANGIOGRAPHY;  Surgeon: Corky Crafts, MD;  Location: [facility] INVASIVE CV LAB;  Service: Cardiovascular;  Laterality: N/A;   MOUTH SURGERY     URETHRAL DILATION     Patient Active Problem List   Diagnosis Date Noted   Blood clotting  disorder (HCC) 10/28/2022   Neck pain 10/27/2022   Acute thoracic back pain 10/27/2022   Lumbar radiculopathy 05/26/2022   Degeneration of lumbar intervertebral disc 05/26/2022   Major depressive disorder    Severe alcohol use disorder    Generalized anxiety disorder    Obstructive sleep apnea    Hepatic steatosis 04/22/2022   Osteoarthritis of carpometacarpal (CMC) joint of thumb 04/20/2022   Acquired trigger finger of right ring finger 04/20/2022   Bilateral wrist pain 04/20/2022   Chalazion of left lower eyelid 04/15/2022   LLQ pain 04/15/2022   Bipolar 1 disorder (HCC)    Cervical radiculopathy 10/14/2021   Pain  in joint of right hip 07/03/2020   Rib pain 07/03/2020   Recurrent syncope    Orthostasis    Lactic acidosis 12/18/2019   Hematemesis 12/18/2019   Pain in right knee 12/14/2018   PAF (paroxysmal atrial fibrillation) 10/09/2018   Dorsalgia 09/22/2018   Dysphagia 09/22/2018   GERD without esophagitis 09/22/2018   Irritable bowel syndrome without diarrhea 09/22/2018   Osteoarthritis of knee 09/22/2018   Polypharmacy 09/22/2018   Sciatica of right side 09/22/2018   Vitamin D deficiency 09/22/2018   Mixed hyperlipidemia 05/07/2016   Chronic atrial fibrillation (HCC) 09/08/2015   Coronary artery disease involving native coronary artery with angina pectoris 03/24/2015   Essential hypertension 03/03/2015   Chest pain 02/11/2015   Non-ST elevation MI (NSTEMI) (HCC) 07/26/2008    PCP: Tamera Punt  REFERRING PROVIDER: Tamera Punt  REFERRING DIAG: acute thoracic back pain  THERAPY DIAG:  Chronic pain of both knees  Difficulty in walking, not elsewhere classified  Muscle weakness (generalized)  Rationale for Evaluation and Treatment: Rehabilitation  ONSET DATE: 11/2022  SUBJECTIVE:   SUBJECTIVE STATEMENT: Pt states that she has been working cleaning her house and she can "feel it" but is glad that she can do it. She states she has been doing ok at work with her 6 hour shifts  PERTINENT HISTORY: Cervical DDD Coccyx fracture From eval: Pt states her knees have bothered her for years but that they have been worsening lately. She states that 2-3 weeks ago her Lt knee "went out" on her while she was standing in a line. She states she can also hear "crunching" in her knees. Pt also has sciatica down Rt LE due to a fall 3 years ago where she broke her coccyx.  She saw MD who recommended physical therapy. Knee pain increases with prolonged standing and walking, back pain increases with lifting. Pain decreases with use of voltaren gel or meds  PAIN:  Are you having pain? Yes: NPRS scale: 2/10  currently, 7/10 after work/10 Pain location: Rt LE, bilat knees Pain description: burning, aching, sore Aggravating factors: prolonged standing and walking, lifting Relieving factors: meds  PRECAUTIONS: None  WEIGHT BEARING RESTRICTIONS: No  FALLS:  Has patient fallen in last 6 months? No  OCCUPATION: Conservation officer, nature at [employer], lots of walking, lifting and standing  PLOF: Independent  PATIENT GOALS: be able to walk for exercise, return fully to work with decreased pain  NEXT MD VISIT: July 2024  OBJECTIVE: (Measures in this section from initial evaluation unless otherwise noted)   MUSCLE LENGTH: Hamstrings: Right WFL deg; Left WFL deg  POSTURE: decreased lumbar lordosis  PALPATION: Pain with lateral patellar mobs Lt knee Crepitus with patellar mobs bilat Pain with palpation Rt glutes near sacrum TTP with UPAs Rt SIJ  Lumbar ROM: flex WFL   Ext 75%   Rt lateral flexion 50%  pain   Lt lateral flexion WFL   Rt rotation WFL pain end range   Lt rotation WFL pain end range  LOWER EXTREMITY ROM: WFL bilat  LOWER EXTREMITY MMT:  MMT Right eval Left eval  Hip flexion 4- 4 SLR with ER 3/5  Hip extension 3+ 3+  Hip abduction 4- 4  Hip adduction    Hip internal rotation    Hip external rotation 4 4  Knee flexion 4 4  Knee extension 4 4  Ankle dorsiflexion    Ankle plantarflexion    Ankle inversion    Ankle eversion     (Blank rows = not tested)  LOWER EXTREMITY SPECIAL TESTS:  SLR (-) bilat  GAIT: Distance walked: 100' Assistive device utilized: Single point cane Level of assistance: Modified independence Comments: antalgic gait, decreased cadence   TODAY'S TREATMENT:         [facility] Adult PT Treatment:                                                DATE: 01/11/23 Therapeutic Exercise: Nustep L6 x 5 min for warm up Supine hamstring stretch with strap x 30 sec Supine piriformis stretch x 30 sec Supine figure 4 bridge 2x10 PPT + marching 2x10 Clam red TB  x 20 bilat Reverse clam x 20 bilat (ball between knees on RT) Sit to stand red TB around thighs x 20   [facility] Adult PT Treatment:                                                DATE: 01/06/23 Therapeutic Exercise: Supine hamstring stretch with strap x 30 sec Supine hip flexor/quad stretch with strap x 30 sec (2 sets on R) Supine piriformis stretch x 30 sec Supine figure 4 bridge 2x10 PPT + marching 2x10 Sidelying clamshell red TB 2x10 Sidelying reverse clam 2x10   [facility] Adult PT Treatment:                                                DATE: 01/04/23 Therapeutic Exercise: Nustep L5 x 5 min UEs/LEs Figure 4 bridge 2x10 SLR + ER 2x10 PPT + marching 2x10 Standing hip abduction red TB 2x10 Standing hip ext red TB 2x10 Manual Therapy: STM & TPR post tib Self Care: Standing without keeping knees locked, work Financial controller      PATIENT EDUCATION:  Education details: PT POC and goals, HEP Person educated: Patient Education method: Programmer, multimedia, Facilities manager, and Handouts Education comprehension: verbalized understanding and returned demonstration  HOME EXERCISE PROGRAM: Access Code: [email or web address] URL: [email or web address] Date: 12/29/2022 Prepared by: Reggy Eye  Exercises - Figure 4 Bridge  - 1 x daily - 7 x weekly - 2 sets - 10 reps - Hip Extension with Resistance Loop  - 1 x daily - 7 x weekly - 3 sets - 10 reps - Diagonal Hip Extension with Resistance  - 1 x daily - 7 x weekly - 3 sets - 10 reps - Straight Leg Raise with External Rotation  - 1 x daily - 7 x weekly - 2 sets -  10 reps  ASSESSMENT:  CLINICAL IMPRESSION: Pt continues with good response to exercises. Feels better at end of session  OBJECTIVE IMPAIRMENTS: decreased activity tolerance, difficulty walking, decreased strength, increased muscle spasms, and pain.      GOALS: Goals reviewed with patient? Yes  SHORT TERM GOALS: Target date: 01/12/2023   Pt will be independent with initial  HEP Baseline: Goal status: MET   LONG TERM GOALS: Target date: 02/09/2023    Pt will be independent with advanced HEP Baseline:  Goal status: INITIAL  2.  Pt will improve bilat LE strength to 4+/5 to improve standing and walking tolerance Baseline:  Goal status: INITIAL  3.  Pt will tolerate lifting 25# floor to chest with good mechanics and pain <= 2/10 Baseline:  Goal status: INITIAL  4.  Pt will tolerate working full shift with knee pain <= 2/10 Baseline:  Goal status: INITIAL    PLAN:  PT FREQUENCY: 2x/week  PT DURATION: 6 weeks  PLANNED INTERVENTIONS: Therapeutic exercises, Therapeutic activity, Neuromuscular re-education, Balance training, Gait training, Patient/Family education, Self Care, Joint mobilization, Aquatic Therapy, Dry Needling, Electrical stimulation, Cryotherapy, Moist heat, Taping, Vasopneumatic device, Traction, Ultrasound, Ionotophoresis 4mg /ml Dexamethasone, Manual therapy, and Re-evaluation  PLAN FOR NEXT SESSION: standing LE and core strength, lifting mechanics   Naraly Fritcher, PT 01/11/2023, 3:22 PM

## 2023-01-12 ENCOUNTER — Ambulatory Visit: Payer: Medicare Other | Attending: Internal Medicine | Admitting: Nurse Practitioner

## 2023-01-12 DIAGNOSIS — Z01810 Encounter for preprocedural cardiovascular examination: Secondary | ICD-10-CM

## 2023-01-12 NOTE — Progress Notes (Addendum)
Virtual Visit via Telephone Note   Because of Khadra Rutt co-morbid illnesses, she is at least at moderate risk for complications without adequate follow up.  This format is felt to be most appropriate for this patient at this time.  The patient did not have access to video technology/had technical difficulties with video requiring transitioning to audio format only (telephone).  All issues noted in this document were discussed and addressed.  No physical exam could be performed with this format.  Please refer to the patient's chart for her consent to telehealth for [facility].  Evaluation Performed:  Preoperative cardiovascular risk assessment _____________   Date:  01/12/2023   Patient ID:  Lindsay [name], DOB [birth year], MRN [id number] Patient Location:  Home Provider location:   Office  Primary Care Provider:  Charlton Amor, DO Primary Cardiologist:  Chilton Si, MD  Chief Complaint / Patient Profile   67 y.o. y/o female with a h/o CAD s/p PCI in 2020, paroxysmal atrial fibrillation (not on anticoagulation due to history of GI bleed), hypertension, hyperlipidemia, and depression who is pending cervical ESI on 01/18/2023 with [facility] and presents today for telephonic preoperative cardiovascular risk assessment.  History of Present Illness    Lindsay [name] is a 67 y.o. female who presents via audio/video conferencing for a telehealth visit today.  Pt was last seen in cardiology clinic on 07/01/2022 by Dr. Duke Salvia.  At that time Lindsay [name] was doing well.  The patient is now pending procedure as outlined above. Since her last visit, she has done well from a cardiac standpoint.  She notes stable intermittent dizziness/lightheadedness, denies any associated symptoms.  She denies chest pain, palpitations, dyspnea, pnd, orthopnea, n, v, syncope, edema, weight gain, or early satiety. All other systems reviewed and are otherwise negative except as noted above.   Past Medical  History    Past Medical History:  Diagnosis Date   Abnormal mammogram    Thick Tissue   Acquired trigger finger of right ring finger 04/20/2022   Arthritis    Bilateral wrist pain 04/20/2022   Bipolar 1 disorder    Cataract    OU   Cervical radiculopathy 10/14/2021   Chalazion of left lower eyelid 04/15/2022   Chest pain 02/11/2015   Chronic atrial fibrillation 09/08/2015   Colon polyp    Compressed cervical disc    Coronary artery disease involving native coronary artery with angina pectoris 03/24/2015   Dorsalgia 09/22/2018   Dysphagia 09/22/2018   Essential hypertension 03/03/2015   Generalized anxiety disorder    GERD without esophagitis 09/22/2018   Hematemesis 12/18/2019   Hepatic steatosis 04/22/2022   History of appendectomy 1963   Hypertensive retinopathy    OU   Irritable bowel syndrome without diarrhea 09/22/2018   Lactic acidosis 12/18/2019   LLQ pain 04/15/2022   Major depressive disorder    Migraine without status migrainosus, not intractable 09/22/2018   Mixed hyperlipidemia 05/07/2016   Non-ST elevation MI (NSTEMI) 07/26/2008   No cardiac catheterization at the time.   Obstructive sleep apnea    no CPAP use   Orthostasis    Osteoarthritis of carpometacarpal (CMC) joint of thumb 04/20/2022   Osteoarthritis of knee 09/22/2018   PAF (paroxysmal atrial fibrillation) 10/09/2018   Pain in joint of right hip 07/03/2020   Pain in right knee 12/14/2018   Recurrent syncope    Rib pain 07/03/2020   Sciatica of right side 09/22/2018   Severe alcohol use disorder    Vitamin D  deficiency    Past Surgical History:  Procedure Laterality Date   APPENDECTOMY     CARDIAC CATHETERIZATION N/A 03/25/2015   Procedure: Left Heart Cath and Coronary Angiography;  Surgeon: Marykay Lex, MD;  Location: [facility] INVASIVE CV LAB;  Service: Cardiovascular;  Laterality: N/A;   CORONARY STENT INTERVENTION N/A 10/09/2018   Procedure: CORONARY STENT INTERVENTION;  Surgeon: Corky Crafts, MD;  Location: [facility] INVASIVE CV LAB;  Service: Cardiovascular;  Laterality: N/A;   DILATATION & CURETTAGE/HYSTEROSCOPY WITH MYOSURE N/A 05/19/2016   Procedure: DILATATION & CURETTAGE/HYSTEROSCOPY WITH MYOSURE;  Surgeon: Geryl Rankins, MD;  Location: [facility];  Service: Gynecology;  Laterality: N/A;  Possible Myosure for polyps.   EYE SURGERY     LEFT HEART CATH AND CORONARY ANGIOGRAPHY N/A 10/09/2018   Procedure: LEFT HEART CATH AND CORONARY ANGIOGRAPHY;  Surgeon: Corky Crafts, MD;  Location: [facility] INVASIVE CV LAB;  Service: Cardiovascular;  Laterality: N/A;   MOUTH SURGERY     URETHRAL DILATION      Allergies  Allergies  Allergen Reactions   Sulfa Antibiotics Swelling and Rash   Ciprofloxacin Nausea And Vomiting   Depakote [Divalproex Sodium] Other (See Comments)    Reaction not recalled- "it was 20 years ago"   Pine Itching   Risperidone Other (See Comments)    Unknown reaction   Seroquel [Quetiapine Fumarate] Other (See Comments)    VIVID nightmares   Statins Other (See Comments)    joint pain, weakness   Tramadol Other (See Comments)    Serotonin syndrome   Citalopram Rash and Other (See Comments)    Insomnia and agitation also   Lamotrigine Itching, Rash and Other (See Comments)    Stevens-Johnson syndrome and Brown urine, also   Other Other (See Comments) and Cough    Cat dander   Topiramate Rash and Other (See Comments)    Nightmares, brown urine also   Wasp Venom Dermatitis    Edema and redness in area of wasp sting   Zetia [Ezetimibe]     Joint pain, weakness     Home Medications    Prior to Admission medications   Medication Sig Start Date End Date Taking? Authorizing Provider  acetaminophen (TYLENOL) 325 MG tablet Take 2 tablets (650 mg total) by mouth every 6 (six) hours as needed for mild pain (or Fever >/= 101). Patient not taking: Reported on 01/04/2023 12/19/19   Rodolph Bong, MD  amLODipine (NORVASC) 2.5 MG tablet Take 1 tablet (2.5 mg  total) by mouth daily. 11/09/22   Chilton Si, MD  azelastine (OPTIVAR) 0.05 % ophthalmic solution 1 drop 2 (two) times daily.    [provider]  B Complex-C (SUPER B COMPLEX PO) Take by mouth. Patient not taking: Reported on 12/30/2022    [provider]  BIOTIN PO Take 1 tablet by mouth daily.    [provider]  buPROPion (WELLBUTRIN XL) 150 MG 24 hr tablet TAKE ONE TABLET BY MOUTH ONE TIME DAILY Patient not taking: Reported on 01/04/2023 12/12/22   Charlton Amor, DO  buPROPion ER [facility] SR) 100 MG 12 hr tablet Take 1 tablet (100 mg total) by mouth 2 (two) times daily. 12/30/22   Thresa Ross, MD  Cholecalciferol 20 MCG (800 UNIT) TABS Take 1 tablet by mouth daily. 10/28/22   Charlton Amor, DO  clopidogrel (PLAVIX) 75 MG tablet Take 1 tablet (75 mg total) by mouth daily. 11/17/21   Alver Sorrow, NP  Cyanocobalamin (B-12 PO) Take  1 tablet by mouth daily.    [provider]  cyclobenzaprine (FLEXERIL) 5 MG tablet Take 1 tablet (5 mg total) by mouth at bedtime. 10/27/22   Charlton Amor, DO  diclofenac Sodium (VOLTAREN) 1 % GEL Apply topically as needed.    [provider]  esomeprazole (NEXIUM 24HR CLEAR MINIS) 20 MG capsule Take 1 capsule (20 mg total) by mouth daily before breakfast. 12/19/19   Rodolph Bong, MD  Evolocumab (REPATHA SURECLICK) 140 MG/ML SOAJ Inject 140 mg into the skin every 14 (fourteen) days. 03/31/22   Chilton Si, MD  gabapentin (NEURONTIN) 100 MG capsule Take 3 capsules by mouth at bedtime. 01/30/21   [provider]  HYDROcodone-acetaminophen (NORCO/VICODIN) 5-325 MG tablet Take 2 tablets by mouth as needed for moderate pain.    [provider]  losartan (COZAAR) 50 MG tablet TAKE ONE TABLET BY MOUTH ONE TIME DAILY 01/07/22   Chilton Si, MD  methylPREDNISolone (MEDROL DOSEPAK) 4 MG TBPK tablet Take as instructed on pill pack Patient not taking: Reported on 01/04/2023 10/28/22   Charlton Amor, DO  metoprolol tartrate (LOPRESSOR) 25 MG tablet TAKE ONE-HALF TABLET BY MOUTH TWICE A DAY . 01/07/22   Hilty, Lisette Abu, MD  nitroGLYCERIN (NITROSTAT) 0.4 MG SL tablet Place 1 tablet (0.4 mg total) under the tongue as needed for chest pain. Patient not taking: Reported on 01/04/2023 11/17/21   Alver Sorrow, NP  Omega-3 Fatty Acids (OMEGA 3 PO) Take 1 capsule by mouth daily.    [provider]  Plant Sterols and Stanols (CHOLESTOFF PLUS PO) Take by mouth daily.    [provider]  Probiotic Product (PROBIOTIC ADVANCED PO) Take by mouth daily. Patient not taking: Reported on 12/30/2022    [provider]  tiZANidine (ZANAFLEX) 4 MG capsule Take 4 mg by mouth 3 (three) times daily.    [provider]  traZODone (DESYREL) 50 MG tablet Take 25 mg by mouth at bedtime.    [provider]    Physical Exam    Vital Signs:  Lindsay [name] does not have vital signs available for review today.  Given telephonic nature of communication, physical exam is limited. AAOx3. NAD. Normal affect.  Speech and respirations are unlabored.  Accessory Clinical Findings    None  Assessment & Plan    1.  Preoperative Cardiovascular Risk Assessment:  According to the Revised Cardiac Risk Index (RCRI), her Perioperative Risk of Major Cardiac Event is (%): 0.9. Her Functional Capacity in METs is: 6.61 according to the [facility] Activity Status Index (DASI).Therefore, based on ACC/AHA guidelines, patient would be at acceptable risk for the planned procedure without further cardiovascular testing.   The patient was advised that if she develops new symptoms prior to surgery to contact our office to arrange for a follow-up visit, and she verbalized understanding.  Per office protocol, she may hold Plavix for 5-7 days prior to procedure. Please resume Plavix as soon as possible postprocedure, at the discretion of the surgeon.   A copy of this note will be routed to requesting  surgeon.  Time:   Today, I have spent 7 minutes with the patient with telehealth technology discussing medical history, symptoms, and management plan.     Joylene Grapes, NP  01/12/2023, 2:50 PM

## 2023-01-13 ENCOUNTER — Ambulatory Visit: Payer: Medicare Other | Admitting: Physical Therapy

## 2023-01-13 ENCOUNTER — Encounter: Payer: Self-pay | Admitting: Physical Therapy

## 2023-01-13 DIAGNOSIS — G8929 Other chronic pain: Secondary | ICD-10-CM

## 2023-01-13 DIAGNOSIS — M25561 Pain in right knee: Secondary | ICD-10-CM | POA: Diagnosis not present

## 2023-01-13 DIAGNOSIS — M6281 Muscle weakness (generalized): Secondary | ICD-10-CM

## 2023-01-13 DIAGNOSIS — R262 Difficulty in walking, not elsewhere classified: Secondary | ICD-10-CM | POA: Diagnosis not present

## 2023-01-13 DIAGNOSIS — M546 Pain in thoracic spine: Secondary | ICD-10-CM | POA: Diagnosis not present

## 2023-01-13 DIAGNOSIS — M25562 Pain in left knee: Secondary | ICD-10-CM | POA: Diagnosis not present

## 2023-01-13 NOTE — Therapy (Signed)
OUTPATIENT PHYSICAL THERAPY LOWER EXTREMITY TREATMENT   Patient Name: Dmia Bierl MRN: [id number] DOB:10-17-1955, 67 y.o., female Today's Date: 01/13/2023  END OF SESSION:  PT End of Session - 01/13/23 1453     Visit Number 5    Number of Visits 12    Date for PT Re-Evaluation 02/09/23    Authorization Type Medicare    Progress Note Due on Visit 10    PT Start Time 1450    PT Stop Time 1530    PT Time Calculation (min) 40 min    Activity Tolerance Patient tolerated treatment well    Behavior During Therapy [facility] for tasks assessed/performed               Past Medical History:  Diagnosis Date   Abnormal mammogram    Thick Tissue   Acquired trigger finger of right ring finger 04/20/2022   Arthritis    Bilateral wrist pain 04/20/2022   Bipolar 1 disorder    Cataract    OU   Cervical radiculopathy 10/14/2021   Chalazion of left lower eyelid 04/15/2022   Chest pain 02/11/2015   Chronic atrial fibrillation 09/08/2015   Colon polyp    Compressed cervical disc    Coronary artery disease involving native coronary artery with angina pectoris 03/24/2015   Dorsalgia 09/22/2018   Dysphagia 09/22/2018   Essential hypertension 03/03/2015   Generalized anxiety disorder    GERD without esophagitis 09/22/2018   Hematemesis 12/18/2019   Hepatic steatosis 04/22/2022   History of appendectomy 1963   Hypertensive retinopathy    OU   Irritable bowel syndrome without diarrhea 09/22/2018   Lactic acidosis 12/18/2019   LLQ pain 04/15/2022   Major depressive disorder    Migraine without status migrainosus, not intractable 09/22/2018   Mixed hyperlipidemia 05/07/2016   Non-ST elevation MI (NSTEMI) 07/26/2008   No cardiac catheterization at the time.   Obstructive sleep apnea    no CPAP use   Orthostasis    Osteoarthritis of carpometacarpal (CMC) joint of thumb 04/20/2022   Osteoarthritis of knee 09/22/2018   PAF (paroxysmal atrial fibrillation) 10/09/2018   Pain in joint of  right hip 07/03/2020   Pain in right knee 12/14/2018   Recurrent syncope    Rib pain 07/03/2020   Sciatica of right side 09/22/2018   Severe alcohol use disorder    Vitamin D deficiency    Past Surgical History:  Procedure Laterality Date   APPENDECTOMY     CARDIAC CATHETERIZATION N/A 03/25/2015   Procedure: Left Heart Cath and Coronary Angiography;  Surgeon: Marykay Lex, MD;  Location: [facility] INVASIVE CV LAB;  Service: Cardiovascular;  Laterality: N/A;   CORONARY STENT INTERVENTION N/A 10/09/2018   Procedure: CORONARY STENT INTERVENTION;  Surgeon: Corky Crafts, MD;  Location: [facility] INVASIVE CV LAB;  Service: Cardiovascular;  Laterality: N/A;   DILATATION & CURETTAGE/HYSTEROSCOPY WITH MYOSURE N/A 05/19/2016   Procedure: DILATATION & CURETTAGE/HYSTEROSCOPY WITH MYOSURE;  Surgeon: Geryl Rankins, MD;  Location: [facility];  Service: Gynecology;  Laterality: N/A;  Possible Myosure for polyps.   EYE SURGERY     LEFT HEART CATH AND CORONARY ANGIOGRAPHY N/A 10/09/2018   Procedure: LEFT HEART CATH AND CORONARY ANGIOGRAPHY;  Surgeon: Corky Crafts, MD;  Location: [facility] INVASIVE CV LAB;  Service: Cardiovascular;  Laterality: N/A;   MOUTH SURGERY     URETHRAL DILATION     Patient Active Problem List   Diagnosis Date Noted   Blood clotting disorder (HCC) 10/28/2022   Neck pain  10/27/2022   Acute thoracic back pain 10/27/2022   Lumbar radiculopathy 05/26/2022   Degeneration of lumbar intervertebral disc 05/26/2022   Major depressive disorder    Severe alcohol use disorder    Generalized anxiety disorder    Obstructive sleep apnea    Hepatic steatosis 04/22/2022   Osteoarthritis of carpometacarpal (CMC) joint of thumb 04/20/2022   Acquired trigger finger of right ring finger 04/20/2022   Bilateral wrist pain 04/20/2022   Chalazion of left lower eyelid 04/15/2022   LLQ pain 04/15/2022   Bipolar 1 disorder (HCC)    Cervical radiculopathy 10/14/2021   Pain in joint of right hip 07/03/2020    Rib pain 07/03/2020   Recurrent syncope    Orthostasis    Lactic acidosis 12/18/2019   Hematemesis 12/18/2019   Pain in right knee 12/14/2018   PAF (paroxysmal atrial fibrillation) 10/09/2018   Dorsalgia 09/22/2018   Dysphagia 09/22/2018   GERD without esophagitis 09/22/2018   Irritable bowel syndrome without diarrhea 09/22/2018   Osteoarthritis of knee 09/22/2018   Polypharmacy 09/22/2018   Sciatica of right side 09/22/2018   Vitamin D deficiency 09/22/2018   Mixed hyperlipidemia 05/07/2016   Chronic atrial fibrillation (HCC) 09/08/2015   Coronary artery disease involving native coronary artery with angina pectoris 03/24/2015   Essential hypertension 03/03/2015   Chest pain 02/11/2015   Non-ST elevation MI (NSTEMI) (HCC) 07/26/2008    PCP: Tamera Punt  REFERRING PROVIDER: Tamera Punt  REFERRING DIAG: acute thoracic back pain  THERAPY DIAG:  Chronic pain of both knees  Difficulty in walking, not elsewhere classified  Muscle weakness (generalized)  Rationale for Evaluation and Treatment: Rehabilitation  ONSET DATE: 11/2022  SUBJECTIVE:   SUBJECTIVE STATEMENT: Pt states her knees were really bad yesterday but the day before she notes she was on the floor  PERTINENT HISTORY: Cervical DDD Coccyx fracture From eval: Pt states her knees have bothered her for years but that they have been worsening lately. She states that 2-3 weeks ago her Lt knee "went out" on her while she was standing in a line. She states she can also hear "crunching" in her knees. Pt also has sciatica down Rt LE due to a fall 3 years ago where she broke her coccyx.  She saw MD who recommended physical therapy. Knee pain increases with prolonged standing and walking, back pain increases with lifting. Pain decreases with use of voltaren gel or meds  PAIN:  Are you having pain? Yes: NPRS scale: 2/10 currently, 7/10 after work/10 Pain location: Rt LE, bilat knees Pain description: burning, aching,  sore Aggravating factors: prolonged standing and walking, lifting Relieving factors: meds  PRECAUTIONS: None  WEIGHT BEARING RESTRICTIONS: No  FALLS:  Has patient fallen in last 6 months? No  OCCUPATION: Conservation officer, nature at [employer], lots of walking, lifting and standing  PLOF: Independent  PATIENT GOALS: be able to walk for exercise, return fully to work with decreased pain  NEXT MD VISIT: July 2024  OBJECTIVE: (Measures in this section from initial evaluation unless otherwise noted)   MUSCLE LENGTH: Hamstrings: Right WFL deg; Left WFL deg  POSTURE: decreased lumbar lordosis  PALPATION: Pain with lateral patellar mobs Lt knee Crepitus with patellar mobs bilat Pain with palpation Rt glutes near sacrum TTP with UPAs Rt SIJ  Lumbar ROM: flex WFL   Ext 75%   Rt lateral flexion 50% pain   Lt lateral flexion WFL   Rt rotation WFL pain end range   Lt rotation WFL pain end range  LOWER  EXTREMITY ROM: WFL bilat  LOWER EXTREMITY MMT:  MMT Right eval Left eval  Hip flexion 4- 4 SLR with ER 3/5  Hip extension 3+ 3+  Hip abduction 4- 4  Hip adduction    Hip internal rotation    Hip external rotation 4 4  Knee flexion 4 4  Knee extension 4 4  Ankle dorsiflexion    Ankle plantarflexion    Ankle inversion    Ankle eversion     (Blank rows = not tested)  LOWER EXTREMITY SPECIAL TESTS:  SLR (-) bilat  GAIT: Distance walked: 100' Assistive device utilized: Single point cane Level of assistance: Modified independence Comments: antalgic gait, decreased cadence   TODAY'S TREATMENT:     [facility] Adult PT Treatment:                                                DATE: 01/13/23 Therapeutic Exercise: Seated hamstring stretch 2 x 30 sec Seated figure 4 stretch 2 x 30 sec Supine figure 4 bridge 2x10 Deadbug 2x10 Standing hip ext green TB 2x10 Standing side step green TB 2x10       [facility] Adult PT Treatment:                                                DATE:  01/11/23 Therapeutic Exercise: Nustep L6 x 5 min for warm up Supine hamstring stretch with strap x 30 sec Supine piriformis stretch x 30 sec Supine figure 4 bridge 2x10 PPT + marching 2x10 Clam red TB x 20 bilat Reverse clam x 20 bilat (ball between knees on RT) Sit to stand red TB around thighs x 20   [facility] Adult PT Treatment:                                                DATE: 01/06/23 Therapeutic Exercise: Supine hamstring stretch with strap x 30 sec Supine hip flexor/quad stretch with strap x 30 sec (2 sets on R) Supine piriformis stretch x 30 sec Supine figure 4 bridge 2x10 PPT + marching 2x10 Sidelying clamshell red TB 2x10 Sidelying reverse clam 2x10   PATIENT EDUCATION:  Education details: PT POC and goals, HEP Person educated: Patient Education method: Explanation, Demonstration, and Handouts Education comprehension: verbalized understanding and returned demonstration  HOME EXERCISE PROGRAM: Access Code: [email or web address] URL: [email or web address] Date: 12/29/2022 Prepared by: Reggy Eye  Exercises - Figure 4 Bridge  - 1 x daily - 7 x weekly - 2 sets - 10 reps - Hip Extension with Resistance Loop  - 1 x daily - 7 x weekly - 3 sets - 10 reps - Diagonal Hip Extension with Resistance  - 1 x daily - 7 x weekly - 3 sets - 10 reps - Straight Leg Raise with External Rotation  - 1 x daily - 7 x weekly - 2 sets - 10 reps  ASSESSMENT:  CLINICAL IMPRESSION: Able to progress pt's exercises. Tolerated green TB this session. Pt feels she is able to do more at home now since starting her exercises.   OBJECTIVE IMPAIRMENTS: decreased activity tolerance, difficulty  walking, decreased strength, increased muscle spasms, and pain.     GOALS: Goals reviewed with patient? Yes  SHORT TERM GOALS: Target date: 01/12/2023   Pt will be independent with initial HEP Baseline: Goal status: MET   LONG TERM GOALS: Target date: 02/09/2023    Pt will be independent with  advanced HEP Baseline:  Goal status: INITIAL  2.  Pt will improve bilat LE strength to 4+/5 to improve standing and walking tolerance Baseline:  Goal status: INITIAL  3.  Pt will tolerate lifting 25# floor to chest with good mechanics and pain <= 2/10 Baseline:  Goal status: INITIAL  4.  Pt will tolerate working full shift with knee pain <= 2/10 Baseline:  Goal status: INITIAL    PLAN:  PT FREQUENCY: 2x/week  PT DURATION: 6 weeks  PLANNED INTERVENTIONS: Therapeutic exercises, Therapeutic activity, Neuromuscular re-education, Balance training, Gait training, Patient/Family education, Self Care, Joint mobilization, Aquatic Therapy, Dry Needling, Electrical stimulation, Cryotherapy, Moist heat, Taping, Vasopneumatic device, Traction, Ultrasound, Ionotophoresis 4mg /ml Dexamethasone, Manual therapy, and Re-evaluation  PLAN FOR NEXT SESSION: standing LE and core strength, lifting mechanics   Fartun Paradiso April Ma L Hermenia Fritcher, PT 01/13/2023, 3:24 PM

## 2023-01-17 DIAGNOSIS — M25561 Pain in right knee: Secondary | ICD-10-CM | POA: Diagnosis not present

## 2023-01-17 DIAGNOSIS — M25562 Pain in left knee: Secondary | ICD-10-CM | POA: Diagnosis not present

## 2023-01-18 DIAGNOSIS — M25531 Pain in right wrist: Secondary | ICD-10-CM | POA: Diagnosis not present

## 2023-01-18 DIAGNOSIS — M65341 Trigger finger, right ring finger: Secondary | ICD-10-CM | POA: Diagnosis not present

## 2023-01-18 DIAGNOSIS — M1811 Unilateral primary osteoarthritis of first carpometacarpal joint, right hand: Secondary | ICD-10-CM | POA: Diagnosis not present

## 2023-01-18 DIAGNOSIS — M25532 Pain in left wrist: Secondary | ICD-10-CM | POA: Diagnosis not present

## 2023-01-18 DIAGNOSIS — M5412 Radiculopathy, cervical region: Secondary | ICD-10-CM | POA: Diagnosis not present

## 2023-01-19 ENCOUNTER — Other Ambulatory Visit (HOSPITAL_COMMUNITY): Payer: Self-pay | Admitting: *Deleted

## 2023-01-19 ENCOUNTER — Other Ambulatory Visit (HOSPITAL_COMMUNITY): Payer: Self-pay | Admitting: Psychiatry

## 2023-01-19 MED ORDER — BUPROPION HCL ER (SR) 100 MG PO TB12: 100.0000 mg | ORAL_TABLET | Freq: Two times a day (BID) | ORAL | 0 refills | Status: DC

## 2023-01-19 NOTE — Telephone Encounter (Signed)
DONE -- PLEASE SIGN

## 2023-01-19 NOTE — Telephone Encounter (Signed)
buPROPion ER (WELLBUTRIN SR) 100 MG 12 hr tablet  **Rx Says No Refills on File/Hold  [facility] [address] - [place], [place] - [address]   **Patient Request Says She's Out  Last appt  12/30/22 Next appt  02/01/23

## 2023-01-25 ENCOUNTER — Ambulatory Visit: Payer: Medicare Other | Attending: Family Medicine | Admitting: Physical Therapy

## 2023-01-25 ENCOUNTER — Encounter: Payer: Self-pay | Admitting: Physical Therapy

## 2023-01-25 DIAGNOSIS — R262 Difficulty in walking, not elsewhere classified: Secondary | ICD-10-CM | POA: Insufficient documentation

## 2023-01-25 DIAGNOSIS — M25561 Pain in right knee: Secondary | ICD-10-CM | POA: Diagnosis not present

## 2023-01-25 DIAGNOSIS — M25562 Pain in left knee: Secondary | ICD-10-CM | POA: Diagnosis not present

## 2023-01-25 DIAGNOSIS — G8929 Other chronic pain: Secondary | ICD-10-CM | POA: Insufficient documentation

## 2023-01-25 DIAGNOSIS — M6281 Muscle weakness (generalized): Secondary | ICD-10-CM | POA: Diagnosis not present

## 2023-01-25 NOTE — Therapy (Signed)
OUTPATIENT PHYSICAL THERAPY LOWER EXTREMITY TREATMENT   Patient Name: Shella Mckibben MRN: [id number] DOB:1956/07/06, 67 y.o., female Today's Date: 01/25/2023  END OF SESSION:  PT End of Session - 01/25/23 1140     Visit Number 6    Number of Visits 12    Date for PT Re-Evaluation 02/09/23    Authorization Type Medicare    Authorization - Visit Number 6    Progress Note Due on Visit 10    PT Start Time 1107   pt arrived late   PT Stop Time 1145    PT Time Calculation (min) 38 min    Activity Tolerance Patient tolerated treatment well    Behavior During Therapy [facility] for tasks assessed/performed                Past Medical History:  Diagnosis Date   Abnormal mammogram    Thick Tissue   Acquired trigger finger of right ring finger 04/20/2022   Arthritis    Bilateral wrist pain 04/20/2022   Bipolar 1 disorder    Cataract    OU   Cervical radiculopathy 10/14/2021   Chalazion of left lower eyelid 04/15/2022   Chest pain 02/11/2015   Chronic atrial fibrillation 09/08/2015   Colon polyp    Compressed cervical disc    Coronary artery disease involving native coronary artery with angina pectoris 03/24/2015   Dorsalgia 09/22/2018   Dysphagia 09/22/2018   Essential hypertension 03/03/2015   Generalized anxiety disorder    GERD without esophagitis 09/22/2018   Hematemesis 12/18/2019   Hepatic steatosis 04/22/2022   History of appendectomy 1963   Hypertensive retinopathy    OU   Irritable bowel syndrome without diarrhea 09/22/2018   Lactic acidosis 12/18/2019   LLQ pain 04/15/2022   Major depressive disorder    Migraine without status migrainosus, not intractable 09/22/2018   Mixed hyperlipidemia 05/07/2016   Non-ST elevation MI (NSTEMI) 07/26/2008   No cardiac catheterization at the time.   Obstructive sleep apnea    no CPAP use   Orthostasis    Osteoarthritis of carpometacarpal (CMC) joint of thumb 04/20/2022   Osteoarthritis of knee 09/22/2018   PAF (paroxysmal  atrial fibrillation) 10/09/2018   Pain in joint of right hip 07/03/2020   Pain in right knee 12/14/2018   Recurrent syncope    Rib pain 07/03/2020   Sciatica of right side 09/22/2018   Severe alcohol use disorder    Vitamin D deficiency    Past Surgical History:  Procedure Laterality Date   APPENDECTOMY     CARDIAC CATHETERIZATION N/A 03/25/2015   Procedure: Left Heart Cath and Coronary Angiography;  Surgeon: Marykay Lex, MD;  Location: [facility] INVASIVE CV LAB;  Service: Cardiovascular;  Laterality: N/A;   CORONARY STENT INTERVENTION N/A 10/09/2018   Procedure: CORONARY STENT INTERVENTION;  Surgeon: Corky Crafts, MD;  Location: [facility] INVASIVE CV LAB;  Service: Cardiovascular;  Laterality: N/A;   DILATATION & CURETTAGE/HYSTEROSCOPY WITH MYOSURE N/A 05/19/2016   Procedure: DILATATION & CURETTAGE/HYSTEROSCOPY WITH MYOSURE;  Surgeon: Geryl Rankins, MD;  Location: [facility];  Service: Gynecology;  Laterality: N/A;  Possible Myosure for polyps.   EYE SURGERY     LEFT HEART CATH AND CORONARY ANGIOGRAPHY N/A 10/09/2018   Procedure: LEFT HEART CATH AND CORONARY ANGIOGRAPHY;  Surgeon: Corky Crafts, MD;  Location: [facility] INVASIVE CV LAB;  Service: Cardiovascular;  Laterality: N/A;   MOUTH SURGERY     URETHRAL DILATION     Patient Active Problem List   Diagnosis  Date Noted   Blood clotting disorder (HCC) 10/28/2022   Neck pain 10/27/2022   Acute thoracic back pain 10/27/2022   Lumbar radiculopathy 05/26/2022   Degeneration of lumbar intervertebral disc 05/26/2022   Major depressive disorder    Severe alcohol use disorder    Generalized anxiety disorder    Obstructive sleep apnea    Hepatic steatosis 04/22/2022   Osteoarthritis of carpometacarpal (CMC) joint of thumb 04/20/2022   Acquired trigger finger of right ring finger 04/20/2022   Bilateral wrist pain 04/20/2022   Chalazion of left lower eyelid 04/15/2022   LLQ pain 04/15/2022   Bipolar 1 disorder (HCC)    Cervical radiculopathy  10/14/2021   Pain in joint of right hip 07/03/2020   Rib pain 07/03/2020   Recurrent syncope    Orthostasis    Lactic acidosis 12/18/2019   Hematemesis 12/18/2019   Pain in right knee 12/14/2018   PAF (paroxysmal atrial fibrillation) 10/09/2018   Dorsalgia 09/22/2018   Dysphagia 09/22/2018   GERD without esophagitis 09/22/2018   Irritable bowel syndrome without diarrhea 09/22/2018   Osteoarthritis of knee 09/22/2018   Polypharmacy 09/22/2018   Sciatica of right side 09/22/2018   Vitamin D deficiency 09/22/2018   Mixed hyperlipidemia 05/07/2016   Chronic atrial fibrillation (HCC) 09/08/2015   Coronary artery disease involving native coronary artery with angina pectoris 03/24/2015   Essential hypertension 03/03/2015   Chest pain 02/11/2015   Non-ST elevation MI (NSTEMI) (HCC) 07/26/2008    PCP: Tamera Punt  REFERRING PROVIDER: Tamera Punt  REFERRING DIAG: acute thoracic back pain  THERAPY DIAG:  Chronic pain of both knees  Difficulty in walking, not elsewhere classified  Muscle weakness (generalized)  Rationale for Evaluation and Treatment: Rehabilitation  ONSET DATE: 11/2022  SUBJECTIVE:   SUBJECTIVE STATEMENT: Pt states she had a shot in her neck but it did not provide any relief. She states she had a few bad days and was off of work for 2 days and her legs did feel better after that. She was able to work yesterday. She states that overall she feels like PT is helping and she is able to do much more work around the house than before  PERTINENT HISTORY: Cervical DDD Coccyx fracture From eval: Pt states her knees have bothered her for years but that they have been worsening lately. She states that 2-3 weeks ago her Lt knee "went out" on her while she was standing in a line. She states she can also hear "crunching" in her knees. Pt also has sciatica down Rt LE due to a fall 3 years ago where she broke her coccyx.  She saw MD who recommended physical therapy. Knee pain increases with  prolonged standing and walking, back pain increases with lifting. Pain decreases with use of voltaren gel or meds  PAIN:  Are you having pain? Yes: NPRS scale: 2/10 currently, 7/10 after work/10 Pain location: Rt LE, bilat knees Pain description: burning, aching, sore Aggravating factors: prolonged standing and walking, lifting Relieving factors: meds  PRECAUTIONS: None  WEIGHT BEARING RESTRICTIONS: No  FALLS:  Has patient fallen in last 6 months? No  OCCUPATION: Conservation officer, nature at [employer], lots of walking, lifting and standing  PLOF: Independent  PATIENT GOALS: be able to walk for exercise, return fully to work with decreased pain  NEXT MD VISIT: July 2024  OBJECTIVE: (Measures in this section from initial evaluation unless otherwise noted)   MUSCLE LENGTH: Hamstrings: Right WFL deg; Left WFL deg  POSTURE: decreased lumbar lordosis  PALPATION:  Pain with lateral patellar mobs Lt knee Crepitus with patellar mobs bilat Pain with palpation Rt glutes near sacrum TTP with UPAs Rt SIJ  Lumbar ROM: flex WFL   Ext 75%   Rt lateral flexion 50% pain   Lt lateral flexion WFL   Rt rotation WFL pain end range   Lt rotation WFL pain end range  LOWER EXTREMITY ROM: WFL bilat  LOWER EXTREMITY MMT:  MMT Right eval Left eval  Hip flexion 4- 4 SLR with ER 3/5  Hip extension 3+ 3+  Hip abduction 4- 4  Hip adduction    Hip internal rotation    Hip external rotation 4 4  Knee flexion 4 4  Knee extension 4 4  Ankle dorsiflexion    Ankle plantarflexion    Ankle inversion    Ankle eversion     (Blank rows = not tested)  LOWER EXTREMITY SPECIAL TESTS:  SLR (-) bilat  GAIT: Distance walked: 100' Assistive device utilized: Single point cane Level of assistance: Modified independence Comments: antalgic gait, decreased cadence   TODAY'S TREATMENT:     [facility] Adult PT Treatment:                                                DATE: 01/25/23 Therapeutic Exercise: Seated HS  stretch 2 x 30 sec Seated piriformis stretch 2 x 30 sec Supine figure 4 bridge 2 x 10 Dead bug with physioball x 10 STS 10# KB x 10 Modified dead lift 10# KB 2 x 10 Resisted walking laterally 10#  bilat Bent over hip ext with knee flexion 2 x 10 bilat    [facility] Adult PT Treatment:                                                DATE: 01/13/23 Therapeutic Exercise: Seated hamstring stretch 2 x 30 sec Seated figure 4 stretch 2 x 30 sec Supine figure 4 bridge 2x10 Deadbug 2x10 Standing hip ext green TB 2x10 Standing side step green TB 2x10       [facility] Adult PT Treatment:                                                DATE: 01/11/23 Therapeutic Exercise: Nustep L6 x 5 min for warm up Supine hamstring stretch with strap x 30 sec Supine piriformis stretch x 30 sec Supine figure 4 bridge 2x10 PPT + marching 2x10 Clam red TB x 20 bilat Reverse clam x 20 bilat (ball between knees on RT) Sit to stand red TB around thighs x 20   [facility] Adult PT Treatment:                                                DATE: 01/06/23 Therapeutic Exercise: Supine hamstring stretch with strap x 30 sec Supine hip flexor/quad stretch with strap x 30 sec (2 sets on R) Supine piriformis stretch x 30 sec Supine figure 4 bridge 2x10  PPT + marching 2x10 Sidelying clamshell red TB 2x10 Sidelying reverse clam 2x10   PATIENT EDUCATION:  Education details: PT POC and goals, HEP Person educated: Patient Education method: Explanation, Demonstration, and Handouts Education comprehension: verbalized understanding and returned demonstration  HOME EXERCISE PROGRAM: Access Code: [email or web address] URL: [email or web address] Date: 01/25/2023 Prepared by: Reggy Eye  Exercises - Figure 4 Bridge  - 1 x daily - 7 x weekly - 2 sets - 10 reps - Straight Leg Raise with External Rotation  - 1 x daily - 7 x weekly - 2 sets - 10 reps - Hip Extension with Resistance Loop  - 1 x daily - 7 x weekly - 3 sets - 10  reps - Diagonal Hip Extension with Resistance  - 1 x daily - 7 x weekly - 3 sets - 10 reps - Supine Hamstring Stretch with Strap  - 1 x daily - 7 x weekly - 2 sets - 30 sec hold - Supine Quadriceps Stretch with Strap on Table  - 1 x daily - 7 x weekly - 2 sets - 30 sec hold - Supine Piriformis Stretch with Foot on Ground  - 1 x daily - 7 x weekly - 2 sets - 30 sec hold - Clamshell with Resistance  - 1 x daily - 7 x weekly - 2 sets - 10 reps - Seated Piriformis Stretch with Trunk Bend  - 1 x daily - 7 x weekly - 2 sets - 30 sec hold - Seated Hamstring Stretch  - 1 x daily - 7 x weekly - 2 sets - 30 sec hold - Dead Bug  - 1 x daily - 7 x weekly - 2 sets - 10 reps - Side Stepping with Resistance at Ankles and Counter Support  - 1 x daily - 7 x weekly - 2 sets - 10 reps - Hip Extension with Single Leg Support Prone on Table Edge  - 1 x daily - 7 x weekly - 3 sets - 10 reps - Half Deadlift with Kettlebell  - 1 x daily - 7 x weekly - 3 sets - 10 reps - Sit to Stand  - 1 x daily - 7 x weekly - 3 sets - 10 reps  ASSESSMENT:  CLINICAL IMPRESSION: Pt with several LOB with resisted walking laterally - able to self correct. Pt with good response to progression of exercises today, working towards goals. updated  OBJECTIVE IMPAIRMENTS: decreased activity tolerance, difficulty walking, decreased strength, increased muscle spasms, and pain.     GOALS: Goals reviewed with patient? Yes  SHORT TERM GOALS: Target date: 01/12/2023   Pt will be independent with initial HEP Baseline: Goal status: MET   LONG TERM GOALS: Target date: 02/09/2023    Pt will be independent with advanced HEP Baseline:  Goal status: INITIAL  2.  Pt will improve bilat LE strength to 4+/5 to improve standing and walking tolerance Baseline:  Goal status: INITIAL  3.  Pt will tolerate lifting 25# floor to chest with good mechanics and pain <= 2/10 Baseline:  Goal status: INITIAL  4.  Pt will tolerate working full shift  with knee pain <= 2/10 Baseline:  Goal status: INITIAL    PLAN:  PT FREQUENCY: 2x/week  PT DURATION: 6 weeks  PLANNED INTERVENTIONS: Therapeutic exercises, Therapeutic activity, Neuromuscular re-education, Balance training, Gait training, Patient/Family education, Self Care, Joint mobilization, Aquatic Therapy, Dry Needling, Electrical stimulation, Cryotherapy, Moist heat, Taping, Vasopneumatic device, Traction, Ultrasound, Ionotophoresis 4mg /ml Dexamethasone, Manual therapy, and Re-evaluation  PLAN FOR NEXT SESSION: standing LE and core strength, lifting mechanics   Chad Tiznado, PT 01/25/2023, 11:41 AM

## 2023-01-28 ENCOUNTER — Encounter: Payer: Self-pay | Admitting: Emergency Medicine

## 2023-01-28 ENCOUNTER — Ambulatory Visit: Payer: Medicare Other | Admitting: Physical Therapy

## 2023-01-28 ENCOUNTER — Ambulatory Visit
Admission: EM | Admit: 2023-01-28 | Discharge: 2023-01-28 | Disposition: A | Discharge status: Home / Self Care | Payer: Medicare Other | Attending: Urgent Care | Admitting: Urgent Care

## 2023-01-28 ENCOUNTER — Encounter: Payer: Self-pay | Admitting: Internal Medicine

## 2023-01-28 ENCOUNTER — Ambulatory Visit (INDEPENDENT_AMBULATORY_CARE_PROVIDER_SITE_OTHER): Payer: Medicare Other | Admitting: Internal Medicine

## 2023-01-28 VITALS — BP 124/66 | HR 93 | Ht 62.0 in | Wt 174.0 lb

## 2023-01-28 DIAGNOSIS — I447 Left bundle-branch block, unspecified: Secondary | ICD-10-CM

## 2023-01-28 DIAGNOSIS — I25119 Atherosclerotic heart disease of native coronary artery with unspecified angina pectoris: Secondary | ICD-10-CM

## 2023-01-28 DIAGNOSIS — E782 Mixed hyperlipidemia: Secondary | ICD-10-CM

## 2023-01-28 DIAGNOSIS — J029 Acute pharyngitis, unspecified: Secondary | ICD-10-CM | POA: Insufficient documentation

## 2023-01-28 DIAGNOSIS — E785 Hyperlipidemia, unspecified: Secondary | ICD-10-CM

## 2023-01-28 DIAGNOSIS — B370 Candidal stomatitis: Secondary | ICD-10-CM | POA: Insufficient documentation

## 2023-01-28 DIAGNOSIS — B3781 Candidal esophagitis: Secondary | ICD-10-CM | POA: Diagnosis not present

## 2023-01-28 LAB — POC SARS CORONAVIRUS 2 AG -  ED: SARS Coronavirus 2 Ag: NEGATIVE

## 2023-01-28 LAB — POCT MONO SCREEN (KUC): Mono, POC: NEGATIVE

## 2023-01-28 LAB — POCT RAPID STREP A (OFFICE): Rapid Strep A Screen: NEGATIVE

## 2023-01-28 MED ORDER — PENICILLIN V POTASSIUM 500 MG PO TABS: 500.0000 mg | ORAL_TABLET | Freq: Two times a day (BID) | ORAL | 0 refills | Status: AC

## 2023-01-28 MED ORDER — NYSTATIN 100000 UNIT/ML MT SUSP: 500000.0000 [IU] | Freq: Four times a day (QID) | OROMUCOSAL | 0 refills | Status: DC

## 2023-01-28 NOTE — Progress Notes (Signed)
LIPID CLINIC CONSULT NOTE  Chief Complaint:  Follow-up dyslipidemia, chest pain  Primary Care Physician: Charlton Amor, DO  Primary Cardiologist:  Chilton Si, MD  HPI:  Lindsay [name] is a 67 y.o. female who is being seen today for the evaluation of dyslipidemia at the request of Wachs, Erika S, DO.  This is a pleasant 67 year old female who presents for evaluation and management of dyslipidemia.  Past medical history significant for atrial fibrillation, coronary artery disease with prior intervention, myocardial infarction, hypertension and bipolar disorder.  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  She is followed closely by Dr. Duke Salvia and noted to have a dyslipidemia above target LDL less than 70 but has been intolerant to statins and ezetimibe causing significant myalgias.  Her last lipid profile is actually quite old from March 2020 showing total cholesterol 287, HDL 59, LDL of 204 and triglycerides 121.  She reports a varied diet not necessarily low in saturated fats.  There is a strong family history of heart disease and heart attacks mostly on the mother side of the family including several aunts and uncles which is highly suggestive of familial hyperlipidemia.  11/12/2021  [name] returns today for follow-up.  She recently got back from a trip to [place].  She reports that she has not recently taken her Repatha for the past month and a half due to not being able to for the medication.  We were able to get her health well grant approved, however she apparently lost the envelope which contain the co-pay card.  We will provide her with that information today.  She has not had recent lipid testing however since she has been off the medication it would be of little value to do that testing today.  She also reported she had chest pain a few days ago, but has not had any since.  01/28/2023  Lindsay [name] is seen today in follow-up.   Fortunately she had some acute pharyngitis which she was seen for earlier this morning, believed to be bacterial.  Otherwise she says she has been doing well.  She did take a trip to [place].  She had repeat lipid testing in April which showed an LDL of 116.  It was unclear if she was on her Repatha at that time but she believes she was.  She was able to get a grant for the medication and has been paying nothing for the medicine.  I would like to get repeat lipid testing.  I do not believe she has ever had an LP(a).  PMHx:  Past Medical History:  Diagnosis Date   Abnormal mammogram    Thick Tissue   Acquired trigger finger of right ring finger 04/20/2022   Arthritis    Bilateral wrist pain 04/20/2022   Bipolar 1 disorder    Cataract    OU   Cervical radiculopathy 10/14/2021   Chalazion of left lower eyelid 04/15/2022   Chest pain 02/11/2015   Chronic atrial fibrillation 09/08/2015   Colon polyp    Compressed cervical disc    Coronary artery disease involving native coronary artery with angina pectoris 03/24/2015   Dorsalgia 09/22/2018   Dysphagia 09/22/2018   Essential hypertension 03/03/2015   Generalized anxiety disorder    GERD without esophagitis 09/22/2018   Hematemesis 12/18/2019   Hepatic steatosis 04/22/2022   History of appendectomy 1963   Hypertensive retinopathy    OU   Irritable bowel  syndrome without diarrhea 09/22/2018   Lactic acidosis 12/18/2019   LLQ pain 04/15/2022   Major depressive disorder    Migraine without status migrainosus, not intractable 09/22/2018   Mixed hyperlipidemia 05/07/2016   Non-ST elevation MI (NSTEMI) 07/26/2008   No cardiac catheterization at the time.   Obstructive sleep apnea    no CPAP use   Orthostasis    Osteoarthritis of carpometacarpal (CMC) joint of thumb 04/20/2022   Osteoarthritis of knee 09/22/2018   PAF (paroxysmal atrial fibrillation) 10/09/2018   Pain in joint of right hip 07/03/2020   Pain in right knee 12/14/2018    Recurrent syncope    Rib pain 07/03/2020   Sciatica of right side 09/22/2018   Severe alcohol use disorder    Vitamin D deficiency     Past Surgical History:  Procedure Laterality Date   APPENDECTOMY     CARDIAC CATHETERIZATION N/A 03/25/2015   Procedure: Left Heart Cath and Coronary Angiography;  Surgeon: Marykay Lex, MD;  Location: [facility] INVASIVE CV LAB;  Service: Cardiovascular;  Laterality: N/A;   CORONARY STENT INTERVENTION N/A 10/09/2018   Procedure: CORONARY STENT INTERVENTION;  Surgeon: Corky Crafts, MD;  Location: [facility] INVASIVE CV LAB;  Service: Cardiovascular;  Laterality: N/A;   DILATATION & CURETTAGE/HYSTEROSCOPY WITH MYOSURE N/A 05/19/2016   Procedure: DILATATION & CURETTAGE/HYSTEROSCOPY WITH MYOSURE;  Surgeon: Geryl Rankins, MD;  Location: [facility];  Service: Gynecology;  Laterality: N/A;  Possible Myosure for polyps.   EYE SURGERY     LEFT HEART CATH AND CORONARY ANGIOGRAPHY N/A 10/09/2018   Procedure: LEFT HEART CATH AND CORONARY ANGIOGRAPHY;  Surgeon: Corky Crafts, MD;  Location: [facility] INVASIVE CV LAB;  Service: Cardiovascular;  Laterality: N/A;   MOUTH SURGERY     URETHRAL DILATION      FAMHx:  Family History  Problem Relation Age of Onset   Vascular Disease Mother    Dementia Mother    Hypertension Mother    Stroke Mother    Depression Mother    Dementia Father    Heart attack Father    Stroke Father    Heart attack Maternal Grandmother    Heart attack Maternal Grandfather    Kidney failure Paternal Grandmother    Heart attack Paternal Grandfather     SOCHx:   reports that she has never smoked. She has never used smokeless tobacco. She reports that she does not currently use alcohol. She reports that she does not use drugs.  ALLERGIES:  Allergies  Allergen Reactions   Sulfa Antibiotics Swelling and Rash   Ciprofloxacin Nausea And Vomiting   Depakote [Divalproex Sodium] Other (See Comments)    Reaction not recalled- "it was 20 years ago"    Pine Itching   Risperidone Other (See Comments)    Unknown reaction   Seroquel [Quetiapine Fumarate] Other (See Comments)    VIVID nightmares   Statins Other (See Comments)    joint pain, weakness   Tramadol Other (See Comments)    Serotonin syndrome   Citalopram Rash and Other (See Comments)    Insomnia and agitation also   Lamotrigine Itching, Rash and Other (See Comments)    Stevens-Johnson syndrome and Brown urine, also   Other Other (See Comments) and Cough    Cat dander   Topiramate Rash and Other (See Comments)    Nightmares, brown urine also   Wasp Venom Dermatitis    Edema and redness in area of wasp sting   Zetia [Ezetimibe]     Joint  pain, weakness     ROS: Pertinent items noted in HPI and remainder of comprehensive ROS otherwise negative.  HOME MEDS: Current Outpatient Medications on File Prior to Visit  Medication Sig Dispense Refill   acetaminophen (TYLENOL) 325 MG tablet Take 2 tablets (650 mg total) by mouth every 6 (six) hours as needed for mild pain (or Fever >/= 101).     amLODipine (NORVASC) 2.5 MG tablet Take 1 tablet (2.5 mg total) by mouth daily. 90 tablet 1   azelastine (OPTIVAR) 0.05 % ophthalmic solution 1 drop 2 (two) times daily.     B Complex-C (SUPER B COMPLEX PO) Take by mouth.     BIOTIN PO Take 1 tablet by mouth daily.     buPROPion ER (WELLBUTRIN SR) 100 MG 12 hr tablet Take 1 tablet (100 mg total) by mouth 2 (two) times daily. 30 tablet 0   Cholecalciferol 20 MCG (800 UNIT) TABS Take 1 tablet by mouth daily. 75 tablet 0   clopidogrel (PLAVIX) 75 MG tablet Take 1 tablet (75 mg total) by mouth daily. 90 tablet 3   Cyanocobalamin (B-12 PO) Take 1 tablet by mouth daily.     diclofenac Sodium (VOLTAREN) 1 % GEL Apply topically as needed.     esomeprazole (NEXIUM 24HR CLEAR MINIS) 20 MG capsule Take 1 capsule (20 mg total) by mouth daily before breakfast. 30 capsule 1   Evolocumab (REPATHA SURECLICK) 140 MG/ML SOAJ Inject 140 mg into the skin every  14 (fourteen) days. 6 mL 3   gabapentin (NEURONTIN) 100 MG capsule Take 3 capsules by mouth at bedtime.     HYDROcodone-acetaminophen (NORCO/VICODIN) 5-325 MG tablet Take 2 tablets by mouth as needed for moderate pain.     losartan (COZAAR) 50 MG tablet TAKE ONE TABLET BY MOUTH ONE TIME DAILY 90 tablet 2   methylPREDNISolone (MEDROL DOSEPAK) 4 MG TBPK tablet Take as instructed on pill pack 21 tablet 0   metoprolol tartrate (LOPRESSOR) 25 MG tablet TAKE ONE-HALF TABLET BY MOUTH TWICE A DAY . 90 tablet 3   nitroGLYCERIN (NITROSTAT) 0.4 MG SL tablet Place 1 tablet (0.4 mg total) under the tongue as needed for chest pain. 25 tablet 5   Omega-3 Fatty Acids (OMEGA 3 PO) Take 1 capsule by mouth daily.     Plant Sterols and Stanols (CHOLESTOFF PLUS PO) Take by mouth daily.     Probiotic Product (PROBIOTIC ADVANCED PO) Take by mouth daily.     tiZANidine (ZANAFLEX) 4 MG capsule Take 4 mg by mouth 3 (three) times daily.     traZODone (DESYREL) 50 MG tablet Take 25 mg by mouth at bedtime.     buPROPion (WELLBUTRIN XL) 150 MG 24 hr tablet TAKE ONE TABLET BY MOUTH ONE TIME DAILY (Patient not taking: Reported on 01/28/2023) 30 tablet 4   No current facility-administered medications on file prior to visit.    LABS/IMAGING: Results for orders placed or performed during the hospital encounter of 01/28/23 (from the past 48 hour(s))  POCT rapid strep A     Status: None   Collection Time: 01/28/23  9:24 AM  Result Value Ref Range   Rapid Strep A Screen Negative Negative  POCT mono screen     Status: None   Collection Time: 01/28/23  9:47 AM  Result Value Ref Range   Mono, POC Negative Negative  POC SARS Coronavirus 2 Ag-ED -     Status: None   Collection Time: 01/28/23  9:47 AM  Result Value Ref Range  SARS Coronavirus 2 Ag Negative Negative   No results found.  LIPID PANEL:    Component Value Date/Time   CHOL 216 (H) 10/27/2022 1402   CHOL 285 (H) 11/17/2020 1416   TRIG 119 10/27/2022 1402   HDL  77 10/27/2022 1402   HDL 57 11/17/2020 1416   CHOLHDL 2.8 10/27/2022 1402   VLDL 44 (H) 04/02/2016 1554   LDLCALC 116 (H) 10/27/2022 1402    WEIGHTS: Wt Readings from Last 3 Encounters:  01/28/23 174 lb (78.9 kg)  01/28/23 174 lb (78.9 kg)  11/04/22 180 lb 6.4 oz (81.8 kg)    VITALS: BP 124/66 (BP Location: Left Arm, Patient Position: Sitting, Cuff Size: Normal)   Pulse 93   Ht 5\' 2"  (1.575 m)   Wt 174 lb (78.9 kg)   SpO2 99%   BMI 31.83 kg/m   EXAM: Deferred  EKG: Deferred  ASSESSMENT: Probable familial hyperlipidemia Coronary artery disease with prior PCI History of myocardial infarction Statin and ezetimibe intolerance-myalgias  PLAN: 1.   Ms. Speare had an LDL of 116 back in April.  I believe she was on Repatha at that time but is definitely been compliant with it and now receiving the medication at no cost.  I will repeat her lipids and LP(a) and if necessary we may need to investigate other options to try to target her LDL to a target less than 70.  Follow-up with me in 6 months or sooner as necessary.  Chrystie Nose, MD, [facility], FACP  [place]  [facility]  Medical Director of the Advanced Lipid Disorders &  Cardiovascular Risk Reduction Clinic Diplomate of the American Board of Clinical Lipidology Attending Cardiologist  Direct Dial: [phone]  Fax: [phone]  Website:  [email or web address] Nicely Teondre Jarosz 01/28/2023, 3:05 PM

## 2023-01-28 NOTE — Patient Instructions (Signed)
Medication Instructions:  No changes   *If you need a refill on your cardiac medications before your next appointment, please call your pharmacy*   Lab Work: Lab work can be done when you are feeling well.  You have the orders and can come into office or to any labcoprp if you so choose.   If you have labs (blood work) drawn today and your tests are completely normal, you will receive your results only by: MyChart Message (if you have MyChart) OR A paper copy in the mail If you have any lab test that is abnormal or we need to change your treatment, we will call you to review the results.    Follow-Up: At [facility], you and your health needs are our priority.  As part of our continuing mission to provide you with exceptional heart care, we have created designated Provider Care Teams.  These Care Teams include your primary Cardiologist (physician) and Advanced Practice Providers (APPs -  Physician Assistants and Nurse Practitioners) who all work together to provide you with the care you need, when you need it.  We recommend signing up for the patient portal called "MyChart".  Sign up information is provided on this After Visit Summary.  MyChart is used to connect with patients for Virtual Visits (Telemedicine).  Patients are able to view lab/test results, encounter notes, upcoming appointments, etc.  Non-urgent messages can be sent to your provider as well.   To learn more about what you can do with MyChart, go to [email or web address].    Your next appointment:   6 month(s)  Provider:   Dr. Rennis Golden

## 2023-01-28 NOTE — Discharge Instructions (Addendum)
Your COVID, strep, mono test are all negative. I suspect you have bacterial pharyngitis therefore please start taking the antibiotic twice daily for the next 10 days. After the third day of your antibiotics, please change out your toothbrush and get a new 1.  Swish the nystatin mouthwash in your mouth for 5 minutes, and then swallow.  Do this 4 times daily until gone.  If any new or worsening symptoms, please return for recheck.

## 2023-01-28 NOTE — ED Triage Notes (Signed)
Patient c/o sore throat, feels like her throat is swollen x 1 week.  Unable to swallow food, been doing a lot of fluids.  Just hasn't felt well.  Denies any OTC cold meds.

## 2023-01-28 NOTE — ED Provider Notes (Signed)
[name]    CSN: [id number] Arrival date & time: 01/28/23  0856      History   Chief Complaint Chief Complaint  Patient presents with   Sore Throat    HPI Lindsay [name] is a 67 y.o. female.   Pleasant 67 year old female presents today due to concerns of a sore throat.  She states over the past 1 week her throat has been hurting, to the point where she feels like her pills are getting stuck.  She also reports for the past several days and unrelenting headache, decrease in taste and smell, and fatigue.  She does have a history of mono would like to be tested for this.  Does report chills, unknown if fever.  Thinks she may have been exposed to other people that were sick.  Has been trying over-the-counter medication without significant relief.   Sore Throat    Past Medical History:  Diagnosis Date   Abnormal mammogram    Thick Tissue   Acquired trigger finger of right ring finger 04/20/2022   Arthritis    Bilateral wrist pain 04/20/2022   Bipolar 1 disorder    Cataract    OU   Cervical radiculopathy 10/14/2021   Chalazion of left lower eyelid 04/15/2022   Chest pain 02/11/2015   Chronic atrial fibrillation 09/08/2015   Colon polyp    Compressed cervical disc    Coronary artery disease involving native coronary artery with angina pectoris 03/24/2015   Dorsalgia 09/22/2018   Dysphagia 09/22/2018   Essential hypertension 03/03/2015   Generalized anxiety disorder    GERD without esophagitis 09/22/2018   Hematemesis 12/18/2019   Hepatic steatosis 04/22/2022   History of appendectomy 1963   Hypertensive retinopathy    OU   Irritable bowel syndrome without diarrhea 09/22/2018   Lactic acidosis 12/18/2019   LLQ pain 04/15/2022   Major depressive disorder    Migraine without status migrainosus, not intractable 09/22/2018   Mixed hyperlipidemia 05/07/2016   Non-ST elevation MI (NSTEMI) 07/26/2008   No cardiac catheterization at the time.   Obstructive sleep  apnea    no CPAP use   Orthostasis    Osteoarthritis of carpometacarpal (CMC) joint of thumb 04/20/2022   Osteoarthritis of knee 09/22/2018   PAF (paroxysmal atrial fibrillation) 10/09/2018   Pain in joint of right hip 07/03/2020   Pain in right knee 12/14/2018   Recurrent syncope    Rib pain 07/03/2020   Sciatica of right side 09/22/2018   Severe alcohol use disorder    Vitamin D deficiency     Patient Active Problem List   Diagnosis Date Noted   Blood clotting disorder (HCC) 10/28/2022   Neck pain 10/27/2022   Acute thoracic back pain 10/27/2022   Lumbar radiculopathy 05/26/2022   Degeneration of lumbar intervertebral disc 05/26/2022   Major depressive disorder    Severe alcohol use disorder    Generalized anxiety disorder    Obstructive sleep apnea    Hepatic steatosis 04/22/2022   Osteoarthritis of carpometacarpal (CMC) joint of thumb 04/20/2022   Acquired trigger finger of right ring finger 04/20/2022   Bilateral wrist pain 04/20/2022   Chalazion of left lower eyelid 04/15/2022   LLQ pain 04/15/2022   Bipolar 1 disorder (HCC)    Cervical radiculopathy 10/14/2021   Pain in joint of right hip 07/03/2020   Rib pain 07/03/2020   Recurrent syncope    Orthostasis    Lactic acidosis 12/18/2019   Hematemesis 12/18/2019   Pain in right knee  12/14/2018   PAF (paroxysmal atrial fibrillation) 10/09/2018   Dorsalgia 09/22/2018   Dysphagia 09/22/2018   GERD without esophagitis 09/22/2018   Irritable bowel syndrome without diarrhea 09/22/2018   Osteoarthritis of knee 09/22/2018   Polypharmacy 09/22/2018   Sciatica of right side 09/22/2018   Vitamin D deficiency 09/22/2018   Mixed hyperlipidemia 05/07/2016   Chronic atrial fibrillation (HCC) 09/08/2015   Coronary artery disease involving native coronary artery with angina pectoris 03/24/2015   Essential hypertension 03/03/2015   Chest pain 02/11/2015   Non-ST elevation MI (NSTEMI) (HCC) 07/26/2008    Past Surgical  History:  Procedure Laterality Date   APPENDECTOMY     CARDIAC CATHETERIZATION N/A 03/25/2015   Procedure: Left Heart Cath and Coronary Angiography;  Surgeon: Marykay Lex, MD;  Location: [facility] INVASIVE CV LAB;  Service: Cardiovascular;  Laterality: N/A;   CORONARY STENT INTERVENTION N/A 10/09/2018   Procedure: CORONARY STENT INTERVENTION;  Surgeon: Corky Crafts, MD;  Location: [facility] INVASIVE CV LAB;  Service: Cardiovascular;  Laterality: N/A;   DILATATION & CURETTAGE/HYSTEROSCOPY WITH MYOSURE N/A 05/19/2016   Procedure: DILATATION & CURETTAGE/HYSTEROSCOPY WITH MYOSURE;  Surgeon: Geryl Rankins, MD;  Location: [facility];  Service: Gynecology;  Laterality: N/A;  Possible Myosure for polyps.   EYE SURGERY     LEFT HEART CATH AND CORONARY ANGIOGRAPHY N/A 10/09/2018   Procedure: LEFT HEART CATH AND CORONARY ANGIOGRAPHY;  Surgeon: Corky Crafts, MD;  Location: [facility] INVASIVE CV LAB;  Service: Cardiovascular;  Laterality: N/A;   MOUTH SURGERY     URETHRAL DILATION      OB History   No obstetric history on file.      Home Medications    Prior to Admission medications   Medication Sig Start Date End Date Taking? Authorizing Provider  amLODipine (NORVASC) 2.5 MG tablet Take 1 tablet (2.5 mg total) by mouth daily. 11/09/22  Yes Chilton Si, MD  azelastine (OPTIVAR) 0.05 % ophthalmic solution 1 drop 2 (two) times daily.   Yes [provider]  BIOTIN PO Take 1 tablet by mouth daily.   Yes [provider]  buPROPion ER (WELLBUTRIN SR) 100 MG 12 hr tablet Take 1 tablet (100 mg total) by mouth 2 (two) times daily. 01/19/23  Yes Thresa Ross, MD  Cholecalciferol 20 MCG (800 UNIT) TABS Take 1 tablet by mouth daily. 10/28/22  Yes Morey Hummingbird S, DO  clopidogrel (PLAVIX) 75 MG tablet Take 1 tablet (75 mg total) by mouth daily. 11/17/21  Yes Alver Sorrow, NP  Cyanocobalamin (B-12 PO) Take 1 tablet by mouth daily.   Yes [provider]  diclofenac Sodium (VOLTAREN) 1  % GEL Apply topically as needed.   Yes [provider]  esomeprazole (NEXIUM 24HR CLEAR MINIS) 20 MG capsule Take 1 capsule (20 mg total) by mouth daily before breakfast. 12/19/19  Yes Rodolph Bong, MD  Evolocumab (REPATHA SURECLICK) 140 MG/ML SOAJ Inject 140 mg into the skin every 14 (fourteen) days. 03/31/22  Yes Chilton Si, MD  gabapentin (NEURONTIN) 100 MG capsule Take 3 capsules by mouth at bedtime. 01/30/21  Yes [provider]  HYDROcodone-acetaminophen (NORCO/VICODIN) 5-325 MG tablet Take 2 tablets by mouth as needed for moderate pain.   Yes [provider]  losartan (COZAAR) 50 MG tablet TAKE ONE TABLET BY MOUTH ONE TIME DAILY 01/07/22  Yes Chilton Si, MD  metoprolol tartrate (LOPRESSOR) 25 MG tablet TAKE ONE-HALF TABLET BY MOUTH TWICE A DAY . 01/07/22  Yes Hilty, Lisette Abu, MD  nystatin (  MYCOSTATIN) 100000 UNIT/ML suspension Take 5 mLs (500,000 Units total) by mouth 4 (four) times daily. 01/28/23  Yes Bentley Haralson L, PA  Omega-3 Fatty Acids (OMEGA 3 PO) Take 1 capsule by mouth daily.   Yes [provider]  penicillin v potassium (VEETID) 500 MG tablet Take 1 tablet (500 mg total) by mouth in the morning and at bedtime for 10 days. 01/28/23 02/07/23 Yes Beckham Capistran L, PA  Plant Sterols and Stanols (CHOLESTOFF PLUS PO) Take by mouth daily.   Yes [provider]  tiZANidine (ZANAFLEX) 4 MG capsule Take 4 mg by mouth 3 (three) times daily.   Yes [provider]  traZODone (DESYREL) 50 MG tablet Take 25 mg by mouth at bedtime.   Yes [provider]  acetaminophen (TYLENOL) 325 MG tablet Take 2 tablets (650 mg total) by mouth every 6 (six) hours as needed for mild pain (or Fever >/= 101). Patient not taking: Reported on 01/04/2023 12/19/19   Rodolph Bong, MD  B Complex-C (SUPER B COMPLEX PO) Take by mouth. Patient not taking: Reported on 12/30/2022    [provider]  buPROPion (WELLBUTRIN XL) 150 MG 24 hr  tablet TAKE ONE TABLET BY MOUTH ONE TIME DAILY Patient not taking: Reported on 01/04/2023 12/12/22   Charlton Amor, DO  methylPREDNISolone (MEDROL DOSEPAK) 4 MG TBPK tablet Take as instructed on pill pack Patient not taking: Reported on 01/04/2023 10/28/22   Charlton Amor, DO  nitroGLYCERIN (NITROSTAT) 0.4 MG SL tablet Place 1 tablet (0.4 mg total) under the tongue as needed for chest pain. Patient not taking: Reported on 01/04/2023 11/17/21   Alver Sorrow, NP  Probiotic Product (PROBIOTIC ADVANCED PO) Take by mouth daily. Patient not taking: Reported on 12/30/2022    [provider]    Family History Family History  Problem Relation Age of Onset   Vascular Disease Mother    Dementia Mother    Hypertension Mother    Stroke Mother    Depression Mother    Dementia Father    Heart attack Father    Stroke Father    Heart attack Maternal Grandmother    Heart attack Maternal Grandfather    Kidney failure Paternal Grandmother    Heart attack Paternal Grandfather     Social History Social History   Tobacco Use   Smoking status: Never   Smokeless tobacco: Never  Vaping Use   Vaping Use: Never used  Substance Use Topics   Alcohol use: Not Currently    Comment: hx of severe alcohol abuse; most recent admission 02/2022   Drug use: Never     Allergies   Sulfa antibiotics, Ciprofloxacin, Depakote [divalproex sodium], Pine, Risperidone, Seroquel [quetiapine fumarate], Statins, Tramadol, Citalopram, Lamotrigine, Other, Topiramate, Wasp venom, and Zetia [ezetimibe]   Review of Systems Review of Systems As per HPI  Physical Exam Triage Vital Signs ED Triage Vitals  Enc Vitals Group     BP 01/28/23 0917 105/71     Pulse Rate 01/28/23 0917 77     Resp 01/28/23 0917 18     Temp 01/28/23 0917 99.3 F (37.4 C)     Temp Source 01/28/23 0917 Oral     SpO2 01/28/23 0917 95 %     Weight 01/28/23 0920 174 lb (78.9 kg)     Height 01/28/23 0920 5\' 2"  (1.575 m)     Head  Circumference --      Peak Flow --      Pain  Score 01/28/23 0919 5     Pain Loc --      Pain Edu? --      Excl. in GC? --    No data found.  Updated Vital Signs BP 105/71 (BP Location: Right Arm)   Pulse 77   Temp 99.3 F (37.4 C) (Oral)   Resp 18   Ht 5\' 2"  (1.575 m)   Wt 174 lb (78.9 kg)   SpO2 95%   BMI 31.83 kg/m   Visual Acuity Right Eye Distance:   Left Eye Distance:   Bilateral Distance:    Right Eye Near:   Left Eye Near:    Bilateral Near:     Physical Exam Vitals and nursing note reviewed.  Constitutional:      General: She is not in acute distress.    Appearance: She is well-developed and normal weight. She is not ill-appearing, toxic-appearing or diaphoretic.  HENT:     Head: Normocephalic and atraumatic.     Right Ear: Tympanic membrane and ear canal normal. No drainage, swelling or tenderness. No middle ear effusion. Tympanic membrane is not erythematous.     Left Ear: Tympanic membrane and ear canal normal. No drainage, swelling or tenderness.  No middle ear effusion. Tympanic membrane is not erythematous.     Nose: No congestion or rhinorrhea.     Mouth/Throat:     Mouth: Mucous membranes are dry.     Pharynx: Oropharyngeal exudate and posterior oropharyngeal erythema present. No pharyngeal swelling or uvula swelling.     Tonsils: Tonsillar exudate present.     Comments: Thick white patches to posterior pharynx and soft palate Eyes:     Conjunctiva/sclera: Conjunctivae normal.     Pupils: Pupils are equal, round, and reactive to light.  Neck:     Thyroid: No thyromegaly.  Cardiovascular:     Rate and Rhythm: Normal rate and regular rhythm.     Heart sounds: Normal heart sounds. No murmur heard.    No friction rub. No gallop.  Pulmonary:     Effort: Pulmonary effort is normal. No respiratory distress.     Breath sounds: Normal breath sounds. No stridor. No wheezing, rhonchi or rales.  Chest:     Chest wall: No tenderness.  Abdominal:      General: There is no distension.     Palpations: Abdomen is soft.     Tenderness: There is no abdominal tenderness. There is no rebound.  Musculoskeletal:     Cervical back: Normal range of motion and neck supple.  Lymphadenopathy:     Cervical: No cervical adenopathy.  Skin:    General: Skin is warm.     Capillary Refill: Capillary refill takes less than 2 seconds.  Neurological:     General: No focal deficit present.     Mental Status: She is alert and oriented to person, place, and time.  Psychiatric:        Mood and Affect: Mood normal.      UC Treatments / Results  Labs (all labs ordered are listed, but only abnormal results are displayed) Labs Reviewed  CULTURE, GROUP A STREP [facility])  POCT RAPID STREP A (OFFICE)  POCT MONO SCREEN ([facility])  POC SARS CORONAVIRUS 2 AG -  ED    EKG   Radiology No results found.  Procedures Procedures (including critical care time)  Medications Ordered in UC Medications - No data to display  Initial Impression / Assessment and Plan / UC Course  I have  reviewed the triage vital signs and the nursing notes.  Pertinent labs & imaging results that were available during my care of the patient were reviewed by me and considered in my medical decision making (see chart for details).     Acute pharyngitis -rapid strep, COVID, Monospot all negative.  Given clinical presentation however I suspect this to be bacterial.  Will start penicillin VK twice daily for the next 10 days. Thrush of mouth -concern for esophageal involvement as well given patient's complaint of pill sticking in her throat.  Will recommend nystatin swish and swallow every 6hrs for the next week.   Final Clinical Impressions(s) / UC Diagnoses   Final diagnoses:  Acute pharyngitis, unspecified etiology  Thrush of mouth and esophagus (HCC)     Discharge Instructions      Your COVID, strep, mono test are all negative. I suspect you have bacterial pharyngitis therefore  please start taking the antibiotic twice daily for the next 10 days. After the third day of your antibiotics, please change out your toothbrush and get a new 1.  Swish the nystatin mouthwash in your mouth for 5 minutes, and then swallow.  Do this 4 times daily until gone.  If any new or worsening symptoms, please return for recheck.     ED Prescriptions     Medication Sig Dispense Auth. Provider   penicillin v potassium (VEETID) 500 MG tablet Take 1 tablet (500 mg total) by mouth in the morning and at bedtime for 10 days. 20 tablet Hanan Mcwilliams L, PA   nystatin (MYCOSTATIN) 100000 UNIT/ML suspension Take 5 mLs (500,000 Units total) by mouth 4 (four) times daily. 120 mL Dystany Duffy L, PA      PDMP not reviewed this encounter.   Maretta Bees, [place] 01/28/23 1036

## 2023-01-29 LAB — CULTURE, GROUP A STREP (THRC)

## 2023-01-30 LAB — CULTURE, GROUP A STREP (THRC)

## 2023-02-01 ENCOUNTER — Ambulatory Visit (INDEPENDENT_AMBULATORY_CARE_PROVIDER_SITE_OTHER): Payer: Self-pay | Admitting: Family Medicine

## 2023-02-01 ENCOUNTER — Encounter (HOSPITAL_COMMUNITY): Payer: Self-pay | Admitting: Psychiatry

## 2023-02-01 ENCOUNTER — Encounter: Payer: Self-pay | Admitting: Physical Therapy

## 2023-02-01 ENCOUNTER — Ambulatory Visit (INDEPENDENT_AMBULATORY_CARE_PROVIDER_SITE_OTHER): Payer: Medicare Other | Admitting: Psychiatry

## 2023-02-01 ENCOUNTER — Ambulatory Visit: Payer: Medicare Other | Admitting: Physical Therapy

## 2023-02-01 VITALS — BP 160/93 | HR 92 | Ht 62.0 in | Wt 167.0 lb

## 2023-02-01 DIAGNOSIS — F0630 Mood disorder due to known physiological condition, unspecified: Secondary | ICD-10-CM | POA: Diagnosis not present

## 2023-02-01 DIAGNOSIS — F1020 Alcohol dependence, uncomplicated: Secondary | ICD-10-CM

## 2023-02-01 DIAGNOSIS — Z91199 Patient's noncompliance with other medical treatment and regimen due to unspecified reason: Secondary | ICD-10-CM

## 2023-02-01 DIAGNOSIS — G8929 Other chronic pain: Secondary | ICD-10-CM

## 2023-02-01 DIAGNOSIS — R262 Difficulty in walking, not elsewhere classified: Secondary | ICD-10-CM | POA: Diagnosis not present

## 2023-02-01 DIAGNOSIS — M5416 Radiculopathy, lumbar region: Secondary | ICD-10-CM | POA: Diagnosis not present

## 2023-02-01 DIAGNOSIS — M25561 Pain in right knee: Secondary | ICD-10-CM | POA: Diagnosis not present

## 2023-02-01 DIAGNOSIS — M25562 Pain in left knee: Secondary | ICD-10-CM | POA: Diagnosis not present

## 2023-02-01 DIAGNOSIS — M6281 Muscle weakness (generalized): Secondary | ICD-10-CM

## 2023-02-01 DIAGNOSIS — F411 Generalized anxiety disorder: Secondary | ICD-10-CM

## 2023-02-01 MED ORDER — BUPROPION HCL ER (SR) 100 MG PO TB12: 100.0000 mg | ORAL_TABLET | Freq: Two times a day (BID) | ORAL | 1 refills | Status: DC

## 2023-02-01 NOTE — Progress Notes (Signed)
No show

## 2023-02-01 NOTE — Therapy (Signed)
OUTPATIENT PHYSICAL THERAPY LOWER EXTREMITY TREATMENT   Patient Name: Lindsay [name] MRN: [id number] DOB:August 06, 1955, 67 y.o., female Today's Date: 02/01/2023  END OF SESSION:  PT End of Session - 02/01/23 1219     Visit Number 7    Number of Visits 12    Date for PT Re-Evaluation 02/09/23    Authorization - Visit Number 7    Progress Note Due on Visit 10    PT Start Time 1145    PT Stop Time 1224    PT Time Calculation (min) 39 min    Activity Tolerance Patient tolerated treatment well    Behavior During Therapy [facility] for tasks assessed/performed                 Past Medical History:  Diagnosis Date   Abnormal mammogram    Thick Tissue   Acquired trigger finger of right ring finger 04/20/2022   Arthritis    Bilateral wrist pain 04/20/2022   Bipolar 1 disorder    Cataract    OU   Cervical radiculopathy 10/14/2021   Chalazion of left lower eyelid 04/15/2022   Chest pain 02/11/2015   Chronic atrial fibrillation 09/08/2015   Colon polyp    Compressed cervical disc    Coronary artery disease involving native coronary artery with angina pectoris 03/24/2015   Dorsalgia 09/22/2018   Dysphagia 09/22/2018   Essential hypertension 03/03/2015   Generalized anxiety disorder    GERD without esophagitis 09/22/2018   Hematemesis 12/18/2019   Hepatic steatosis 04/22/2022   History of appendectomy 1963   Hypertensive retinopathy    OU   Irritable bowel syndrome without diarrhea 09/22/2018   Lactic acidosis 12/18/2019   LLQ pain 04/15/2022   Major depressive disorder    Migraine without status migrainosus, not intractable 09/22/2018   Mixed hyperlipidemia 05/07/2016   Non-ST elevation MI (NSTEMI) 07/26/2008   No cardiac catheterization at the time.   Obstructive sleep apnea    no CPAP use   Orthostasis    Osteoarthritis of carpometacarpal (CMC) joint of thumb 04/20/2022   Osteoarthritis of knee 09/22/2018   PAF (paroxysmal atrial fibrillation) 10/09/2018   Pain in joint  of right hip 07/03/2020   Pain in right knee 12/14/2018   Recurrent syncope    Rib pain 07/03/2020   Sciatica of right side 09/22/2018   Severe alcohol use disorder    Vitamin D deficiency    Past Surgical History:  Procedure Laterality Date   APPENDECTOMY     CARDIAC CATHETERIZATION N/A 03/25/2015   Procedure: Left Heart Cath and Coronary Angiography;  Surgeon: Marykay Lex, MD;  Location[facility] INVASIVE CV LAB;  Service: Cardiovascular;  Laterality: N/A;   CORONARY STENT INTERVENTION N/A 10/09/2018   Procedure: CORONARY STENT INTERVENTION;  Surgeon: Corky Crafts, MD;  Location: [facility] INVASIVE CV LAB;  Service: Cardiovascular;  Laterality: N/A;   DILATATION & CURETTAGE/HYSTEROSCOPY WITH MYOSURE N/A 05/19/2016   Procedure: DILATATION & CURETTAGE/HYSTEROSCOPY WITH MYOSURE;  Surgeon: Geryl Rankins, MD;  Location: [facility];  Service: Gynecology;  Laterality: N/A;  Possible Myosure for polyps.   EYE SURGERY     LEFT HEART CATH AND CORONARY ANGIOGRAPHY N/A 10/09/2018   Procedure: LEFT HEART CATH AND CORONARY ANGIOGRAPHY;  Surgeon: Corky Crafts, MD;  Location: [facility] INVASIVE CV LAB;  Service: Cardiovascular;  Laterality: N/A;   MOUTH SURGERY     URETHRAL DILATION     Patient Active Problem List   Diagnosis Date Noted   Blood clotting disorder (HCC) 10/28/2022  Neck pain 10/27/2022   Acute thoracic back pain 10/27/2022   Lumbar radiculopathy 05/26/2022   Degeneration of lumbar intervertebral disc 05/26/2022   Major depressive disorder    Severe alcohol use disorder    Generalized anxiety disorder    Obstructive sleep apnea    Hepatic steatosis 04/22/2022   Osteoarthritis of carpometacarpal (CMC) joint of thumb 04/20/2022   Acquired trigger finger of right ring finger 04/20/2022   Bilateral wrist pain 04/20/2022   Chalazion of left lower eyelid 04/15/2022   LLQ pain 04/15/2022   Bipolar 1 disorder (HCC)    Cervical radiculopathy 10/14/2021   Pain in joint of right hip  07/03/2020   Rib pain 07/03/2020   Recurrent syncope    Orthostasis    Lactic acidosis 12/18/2019   Hematemesis 12/18/2019   Pain in right knee 12/14/2018   PAF (paroxysmal atrial fibrillation) 10/09/2018   Dorsalgia 09/22/2018   Dysphagia 09/22/2018   GERD without esophagitis 09/22/2018   Irritable bowel syndrome without diarrhea 09/22/2018   Osteoarthritis of knee 09/22/2018   Polypharmacy 09/22/2018   Sciatica of right side 09/22/2018   Vitamin D deficiency 09/22/2018   Mixed hyperlipidemia 05/07/2016   Chronic atrial fibrillation (HCC) 09/08/2015   Coronary artery disease involving native coronary artery with angina pectoris 03/24/2015   Essential hypertension 03/03/2015   Chest pain 02/11/2015   Non-ST elevation MI (NSTEMI) (HCC) 07/26/2008    PCP: Tamera Punt  REFERRING PROVIDER: Tamera Punt  REFERRING DIAG: acute thoracic back pain  THERAPY DIAG:  Chronic pain of both knees  Difficulty in walking, not elsewhere classified  Muscle weakness (generalized)  Rationale for Evaluation and Treatment: Rehabilitation  ONSET DATE: 11/2022  SUBJECTIVE:   SUBJECTIVE STATEMENT: Pt states she had a shot in her back this morning so she is hoping that she will feel better soon. She also states MD plans to address knees with shots in the future. She states her back continues to feel better. She is hoping to increase hours at work  PERTINENT HISTORY: Cervical DDD Coccyx fracture From eval: Pt states her knees have bothered her for years but that they have been worsening lately. She states that 2-3 weeks ago her Lt knee "went out" on her while she was standing in a line. She states she can also hear "crunching" in her knees. Pt also has sciatica down Rt LE due to a fall 3 years ago where she broke her coccyx.  She saw MD who recommended physical therapy. Knee pain increases with prolonged standing and walking, back pain increases with lifting. Pain decreases with use of voltaren gel or  meds  PAIN:  Are you having pain? Yes: NPRS scale: 2/10 currently, 7/10 after work/10 Pain location: Rt LE, bilat knees Pain description: burning, aching, sore Aggravating factors: prolonged standing and walking, lifting Relieving factors: meds  PRECAUTIONS: None  WEIGHT BEARING RESTRICTIONS: No  FALLS:  Has patient fallen in last 6 months? No  OCCUPATION: Conservation officer, nature at [employer], lots of walking, lifting and standing  PLOF: Independent  PATIENT GOALS: be able to walk for exercise, return fully to work with decreased pain  NEXT MD VISIT: July 2024  OBJECTIVE: (Measures in this section from initial evaluation unless otherwise noted)   MUSCLE LENGTH: Hamstrings: Right WFL deg; Left WFL deg  POSTURE: decreased lumbar lordosis  PALPATION: Pain with lateral patellar mobs Lt knee Crepitus with patellar mobs bilat Pain with palpation Rt glutes near sacrum TTP with UPAs Rt SIJ  Lumbar ROM: flex [facility]  Ext 75%   Rt lateral flexion 50% pain   Lt lateral flexion WFL   Rt rotation WFL pain end range   Lt rotation WFL pain end range  LOWER EXTREMITY ROM: WFL bilat  LOWER EXTREMITY MMT:  MMT Right eval Left eval  Hip flexion 4- 4 SLR with ER 3/5  Hip extension 3+ 3+  Hip abduction 4- 4  Hip adduction    Hip internal rotation    Hip external rotation 4 4  Knee flexion 4 4  Knee extension 4 4  Ankle dorsiflexion    Ankle plantarflexion    Ankle inversion    Ankle eversion     (Blank rows = not tested)  LOWER EXTREMITY SPECIAL TESTS:  SLR (-) bilat  GAIT: Distance walked: 100' Assistive device utilized: Single point cane Level of assistance: Modified independence Comments: antalgic gait, decreased cadence   TODAY'S TREATMENT:     [facility] Adult PT Treatment:                                                DATE: 02/01/23 Therapeutic Exercise: Nustep L6 x 5 min Sit <> stand 10# KB 2 x 10 Modified dead lift 10# KB x 15 Runners step up 6'' x 10 bilat Bent over  hip ext with knee bent 2 x 10 Resisted walking backward 10# (2 handle row position) x 10 Suitcase carry 10# x 2 laps Farmers carry 10# x 2 laps Seated HS stretch 2 x 30 sec Seated piriformis stretch 2 x 30 sec   [facility] Adult PT Treatment:                                                DATE: 01/25/23 Therapeutic Exercise: Seated HS stretch 2 x 30 sec Seated piriformis stretch 2 x 30 sec Supine figure 4 bridge 2 x 10 Dead bug with physioball x 10 STS 10# KB x 10 Modified dead lift 10# KB 2 x 10 Resisted walking laterally 10#  bilat Bent over hip ext with knee flexion 2 x 10 bilat    [facility] Adult PT Treatment:                                                DATE: 01/13/23 Therapeutic Exercise: Seated hamstring stretch 2 x 30 sec Seated figure 4 stretch 2 x 30 sec Supine figure 4 bridge 2x10 Deadbug 2x10 Standing hip ext green TB 2x10 Standing side step green TB 2x10       [facility] Adult PT Treatment:                                                DATE: 01/11/23 Therapeutic Exercise: Nustep L6 x 5 min for warm up Supine hamstring stretch with strap x 30 sec Supine piriformis stretch x 30 sec Supine figure 4 bridge 2x10 PPT + marching 2x10 Clam red TB x 20 bilat Reverse clam x 20 bilat (ball between knees on  RT) Sit to stand red TB around thighs x 20   PATIENT EDUCATION:  Education details: PT POC and goals, HEP Person educated: Patient Education method: Explanation, Demonstration, and Handouts Education comprehension: verbalized understanding and returned demonstration  HOME EXERCISE PROGRAM: Access Code: [email or web address] URL: [email or web address] Date: 01/25/2023 Prepared by: Reggy Eye  Exercises - Figure 4 Bridge  - 1 x daily - 7 x weekly - 2 sets - 10 reps - Straight Leg Raise with External Rotation  - 1 x daily - 7 x weekly - 2 sets - 10 reps - Hip Extension with Resistance Loop  - 1 x daily - 7 x weekly - 3 sets - 10 reps - Diagonal Hip Extension with  Resistance  - 1 x daily - 7 x weekly - 3 sets - 10 reps - Supine Hamstring Stretch with Strap  - 1 x daily - 7 x weekly - 2 sets - 30 sec hold - Supine Quadriceps Stretch with Strap on Table  - 1 x daily - 7 x weekly - 2 sets - 30 sec hold - Supine Piriformis Stretch with Foot on Ground  - 1 x daily - 7 x weekly - 2 sets - 30 sec hold - Clamshell with Resistance  - 1 x daily - 7 x weekly - 2 sets - 10 reps - Seated Piriformis Stretch with Trunk Bend  - 1 x daily - 7 x weekly - 2 sets - 30 sec hold - Seated Hamstring Stretch  - 1 x daily - 7 x weekly - 2 sets - 30 sec hold - Dead Bug  - 1 x daily - 7 x weekly - 2 sets - 10 reps - Side Stepping with Resistance at Ankles and Counter Support  - 1 x daily - 7 x weekly - 2 sets - 10 reps - Hip Extension with Single Leg Support Prone on Table Edge  - 1 x daily - 7 x weekly - 3 sets - 10 reps - Half Deadlift with Kettlebell  - 1 x daily - 7 x weekly - 3 sets - 10 reps - Sit to Stand  - 1 x daily - 7 x weekly - 3 sets - 10 reps  ASSESSMENT:  CLINICAL IMPRESSION: Pt with good tolerance to progression of exercises and addition of step ups and carries. She needs 1 UE assist with step ups for balance. She is progressing well towards goals  OBJECTIVE IMPAIRMENTS: decreased activity tolerance, difficulty walking, decreased strength, increased muscle spasms, and pain.     GOALS: Goals reviewed with patient? Yes  SHORT TERM GOALS: Target date: 01/12/2023   Pt will be independent with initial HEP Baseline: Goal status: MET   LONG TERM GOALS: Target date: 02/09/2023    Pt will be independent with advanced HEP Baseline:  Goal status: INITIAL  2.  Pt will improve bilat LE strength to 4+/5 to improve standing and walking tolerance Baseline:  Goal status: INITIAL  3.  Pt will tolerate lifting 25# floor to chest with good mechanics and pain <= 2/10 Baseline:  Goal status: INITIAL  4.  Pt will tolerate working full shift with knee pain <=  2/10 Baseline:  Goal status: INITIAL    PLAN:  PT FREQUENCY: 2x/week  PT DURATION: 6 weeks  PLANNED INTERVENTIONS: Therapeutic exercises, Therapeutic activity, Neuromuscular re-education, Balance training, Gait training, Patient/Family education, Self Care, Joint mobilization, Aquatic Therapy, Dry Needling, Electrical stimulation, Cryotherapy, Moist heat, Taping, Vasopneumatic device, Traction, Ultrasound, Ionotophoresis 4mg /ml Dexamethasone, Manual therapy, and  Re-evaluation  PLAN FOR NEXT SESSION: check goals, standing LE and core strength, lifting mechanics   Sherrill Mckamie, PT 02/01/2023, 12:19 PM

## 2023-02-01 NOTE — Progress Notes (Signed)
[facility] Follow up visit  Patient Identification: Lindsay [name] MRN:  [id number] Date of Evaluation:  02/01/2023 Referral Source: primary care Chief Complaint:   Chief Complaint  Patient presents with   Follow-up  Depression, anxiety , alcohol use Visit Diagnosis:    ICD-10-CM   1. Generalized anxiety disorder  F41.1     2. Severe alcohol use disorder  F10.20       History of Present Illness: Patient is a 67 years old currently single Caucasian female initailly  referred by primary care physician to establish care for history of bipolar, depression and anxiety.  Patient has been in treatment many years ago but has not been taking any medication for more than 10 years and regarding to her bipolar or depression  Has had history of depression, worries. Not used meds for last 10 years and was started on wellbutrin last visit as it has helped before  Doing much better, less depressed, more motivated and not sad, has avoided alcohol   Sleep fair on trazadone   She has had multiple admission in the past for alcohol-related detox and depression.   Has started with AA but not regularly. 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    Aggravating factor: difficult childhood and conflicts with  dad, financial  alcohol use recurrent Modifying factor: friend, cats  Duration adult life  Severity: improved depression   Hospital admission multiple more so related with alcohol use ; detox and depression    Past Psychiatric History: depression, bipolar   Previous Psychotropic Medications: Yes  Multiple, including risperdal, lamictal  Substance Abuse History in the last 12 months:  Yes.    Consequences of Substance Abuse: Medical Consequences:  discussed effect of alcohol to depression, judjement  Past Medical History:  Past Medical History:   Diagnosis Date   Abnormal mammogram    Thick Tissue   Acquired trigger finger of right ring finger 04/20/2022   Arthritis    Bilateral wrist pain 04/20/2022   Bipolar 1 disorder    Cataract    OU   Cervical radiculopathy 10/14/2021   Chalazion of left lower eyelid 04/15/2022   Chest pain 02/11/2015   Chronic atrial fibrillation 09/08/2015   Colon polyp    Compressed cervical disc    Coronary artery disease involving native coronary artery with angina pectoris 03/24/2015   Dorsalgia 09/22/2018   Dysphagia 09/22/2018   Essential hypertension 03/03/2015   Generalized anxiety disorder    GERD without esophagitis 09/22/2018   Hematemesis 12/18/2019   Hepatic steatosis 04/22/2022   History of appendectomy 1963   Hypertensive retinopathy    OU   Irritable bowel syndrome without diarrhea 09/22/2018   Lactic acidosis 12/18/2019   LLQ pain 04/15/2022   Major depressive disorder    Migraine without status migrainosus, not intractable 09/22/2018   Mixed hyperlipidemia 05/07/2016   Non-ST elevation MI (NSTEMI) 07/26/2008   No cardiac catheterization at the time.   Obstructive sleep apnea    no CPAP use   Orthostasis    Osteoarthritis of carpometacarpal (CMC) joint of thumb 04/20/2022   Osteoarthritis of knee 09/22/2018   PAF (paroxysmal atrial fibrillation) 10/09/2018   Pain in joint of right hip 07/03/2020   Pain in right knee 12/14/2018   Recurrent syncope    Rib pain 07/03/2020   Sciatica  of right side 09/22/2018   Severe alcohol use disorder    Vitamin D deficiency     Past Surgical History:  Procedure Laterality Date   APPENDECTOMY     CARDIAC CATHETERIZATION N/A 03/25/2015   Procedure: Left Heart Cath and Coronary Angiography;  Surgeon: Marykay Lex, MD;  Location: [facility] INVASIVE CV LAB;  Service: Cardiovascular;  Laterality: N/A;   CORONARY STENT INTERVENTION N/A 10/09/2018   Procedure: CORONARY STENT INTERVENTION;  Surgeon: Corky Crafts, MD;  Location: [facility]  INVASIVE CV LAB;  Service: Cardiovascular;  Laterality: N/A;   DILATATION & CURETTAGE/HYSTEROSCOPY WITH MYOSURE N/A 05/19/2016   Procedure: DILATATION & CURETTAGE/HYSTEROSCOPY WITH MYOSURE;  Surgeon: Geryl Rankins, MD;  Location: [facility];  Service: Gynecology;  Laterality: N/A;  Possible Myosure for polyps.   EYE SURGERY     LEFT HEART CATH AND CORONARY ANGIOGRAPHY N/A 10/09/2018   Procedure: LEFT HEART CATH AND CORONARY ANGIOGRAPHY;  Surgeon: Corky Crafts, MD;  Location: [facility] INVASIVE CV LAB;  Service: Cardiovascular;  Laterality: N/A;   MOUTH SURGERY     URETHRAL DILATION      Family Psychiatric History: cousin some mental illness, father possible depression   Family History:  Family History  Problem Relation Age of Onset   Vascular Disease Mother    Dementia Mother    Hypertension Mother    Stroke Mother    Depression Mother    Dementia Father    Heart attack Father    Stroke Father    Heart attack Maternal Grandmother    Heart attack Maternal Grandfather    Kidney failure Paternal Grandmother    Heart attack Paternal Grandfather     Social History:   Social History   Socioeconomic History   Marital status: Single    Spouse name: Not on file   Number of children: 0   Years of education: 16   Highest education level: Bachelor's degree (e.g., BA, AB, BS)  Occupational History   Not on file  Tobacco Use   Smoking status: Never   Smokeless tobacco: Never  Vaping Use   Vaping Use: Never used  Substance and Sexual Activity   Alcohol use: Not Currently    Comment: hx of severe alcohol abuse; most recent admission 02/2022   Drug use: Never   Sexual activity: Not Currently  Other Topics Concern   Not on file  Social History Narrative   Right handed   Drinks caffeine   Split level   Social Determinants of Health   Financial Resource Strain: High Risk (10/26/2022)   Overall Financial Resource Strain (CARDIA)    Difficulty of Paying Living Expenses: Hard  Food  Insecurity: Food Insecurity Present (10/26/2022)   Hunger Vital Sign    Worried About Running Out of Food in the Last Year: Often true    Ran Out of Food in the Last Year: Sometimes true  Transportation Needs: Unmet Transportation Needs (10/26/2022)   PRAPARE - Transportation    Lack of Transportation (Medical): Yes    Lack of Transportation (Non-Medical): Yes  Physical Activity: Unknown (10/26/2022)   Exercise Vital Sign    Days of Exercise per Week: 0 days    Minutes of Exercise per Session: Not on file  Stress: Stress Concern Present (10/26/2022)   [employer] of Occupational Health - Occupational Stress Questionnaire    Feeling of Stress : Very much  Social Connections: Socially Isolated (10/26/2022)   Social Connection and Isolation Panel [NHANES]    Frequency of Communication  with Friends and Family: More than three times a week    Frequency of Social Gatherings with Friends and Family: Once a week    Attends Religious Services: Never    Database administrator or Organizations: No    Attends Engineer, structural: Not on file    Marital Status: Never married    Additional Social History: grew up with parents, rough growing up or challenging with some conflicts with parents  Allergies:   Allergies  Allergen Reactions   Sulfa Antibiotics Swelling and Rash   Ciprofloxacin Nausea And Vomiting   Depakote [Divalproex Sodium] Other (See Comments)    Reaction not recalled- "it was 20 years ago"   Pine Itching   Risperidone Other (See Comments)    Unknown reaction   Seroquel [Quetiapine Fumarate] Other (See Comments)    VIVID nightmares   Statins Other (See Comments)    joint pain, weakness   Tramadol Other (See Comments)    Serotonin syndrome   Citalopram Rash and Other (See Comments)    Insomnia and agitation also   Lamotrigine Itching, Rash and Other (See Comments)    Stevens-Johnson syndrome and Brown urine, also   Other Other (See Comments) and Cough    Cat  dander   Topiramate Rash and Other (See Comments)    Nightmares, brown urine also   Wasp Venom Dermatitis    Edema and redness in area of wasp sting   Zetia [Ezetimibe]     Joint pain, weakness     Metabolic Disorder Labs: No results found for: "HGBA1C", "MPG" No results found for: "PROLACTIN" Lab Results  Component Value Date   CHOL 216 (H) 10/27/2022   TRIG 119 10/27/2022   HDL 77 10/27/2022   CHOLHDL 2.8 10/27/2022   VLDL 44 (H) 04/02/2016   LDLCALC 116 (H) 10/27/2022   LDLCALC 210 (H) 11/17/2020   Lab Results  Component Value Date   TSH 1.134 12/18/2019    Therapeutic Level Labs: No results found for: "LITHIUM" No results found for: "CBMZ" No results found for: "VALPROATE"  Current Medications: Current Outpatient Medications  Medication Sig Dispense Refill   acetaminophen (TYLENOL) 325 MG tablet Take 2 tablets (650 mg total) by mouth every 6 (six) hours as needed for mild pain (or Fever >/= 101).     amLODipine (NORVASC) 2.5 MG tablet Take 1 tablet (2.5 mg total) by mouth daily. 90 tablet 1   azelastine (OPTIVAR) 0.05 % ophthalmic solution 1 drop 2 (two) times daily.     B Complex-C (SUPER B COMPLEX PO) Take by mouth.     BIOTIN PO Take 1 tablet by mouth daily.     buPROPion ER (WELLBUTRIN SR) 100 MG 12 hr tablet Take 1 tablet (100 mg total) by mouth 2 (two) times daily. 30 tablet 0   Cholecalciferol 20 MCG (800 UNIT) TABS Take 1 tablet by mouth daily. 75 tablet 0   clopidogrel (PLAVIX) 75 MG tablet Take 1 tablet (75 mg total) by mouth daily. 90 tablet 3   Cyanocobalamin (B-12 PO) Take 1 tablet by mouth daily.     diclofenac Sodium (VOLTAREN) 1 % GEL Apply topically as needed.     esomeprazole (NEXIUM 24HR CLEAR MINIS) 20 MG capsule Take 1 capsule (20 mg total) by mouth daily before breakfast. 30 capsule 1   Evolocumab (REPATHA SURECLICK) 140 MG/ML SOAJ Inject 140 mg into the skin every 14 (fourteen) days. 6 mL 3   gabapentin (NEURONTIN) 100 MG capsule Take 3  capsules by mouth at bedtime.     HYDROcodone-acetaminophen (NORCO/VICODIN) 5-325 MG tablet Take 2 tablets by mouth as needed for moderate pain.     losartan (COZAAR) 50 MG tablet TAKE ONE TABLET BY MOUTH ONE TIME DAILY 90 tablet 2   methylPREDNISolone (MEDROL DOSEPAK) 4 MG TBPK tablet Take as instructed on pill pack 21 tablet 0   metoprolol tartrate (LOPRESSOR) 25 MG tablet TAKE ONE-HALF TABLET BY MOUTH TWICE A DAY . 90 tablet 3   nitroGLYCERIN (NITROSTAT) 0.4 MG SL tablet Place 1 tablet (0.4 mg total) under the tongue as needed for chest pain. 25 tablet 5   nystatin (MYCOSTATIN) 100000 UNIT/ML suspension Take 5 mLs (500,000 Units total) by mouth 4 (four) times daily. 120 mL 0   Omega-3 Fatty Acids (OMEGA 3 PO) Take 1 capsule by mouth daily.     penicillin v potassium (VEETID) 500 MG tablet Take 1 tablet (500 mg total) by mouth in the morning and at bedtime for 10 days. 20 tablet 0   Plant Sterols and Stanols (CHOLESTOFF PLUS PO) Take by mouth daily.     Probiotic Product (PROBIOTIC ADVANCED PO) Take by mouth daily.     tiZANidine (ZANAFLEX) 4 MG capsule Take 4 mg by mouth 3 (three) times daily.     traZODone (DESYREL) 50 MG tablet Take 25 mg by mouth at bedtime.     buPROPion (WELLBUTRIN XL) 150 MG 24 hr tablet TAKE ONE TABLET BY MOUTH ONE TIME DAILY (Patient not taking: Reported on 01/28/2023) 30 tablet 4   No current facility-administered medications for this visit.      Psychiatric Specialty Exam: Review of Systems  Cardiovascular:  Negative for chest pain.  Neurological:  Negative for tremors.  Psychiatric/Behavioral:  Negative for agitation and self-injury.     Blood pressure (!) 160/93, pulse 92, height 5\' 2"  (1.575 m), weight 167 lb (75.8 kg).Body mass index is 30.54 kg/m.  General Appearance: Casual  Eye Contact:  Fair  Speech:  Clear and Coherent  Volume:  Normal  Mood:  better  Affect:  Constricted  Thought Process:  Goal Directed  Orientation:  oriented fully  Thought  Content:  Rumination  Suicidal Thoughts:  No  Homicidal Thoughts:  No  Memory:  Immediate;   Fair  Judgement:  Fair  Insight:  Shallow  Psychomotor Activity:  Normal  Concentration:  Concentration: Fair  Recall:  Fair  Fund of Knowledge:Fair  Language: Fair  Akathisia:  No  Handed:    AIMS (if indicated):  no involuntary movements  Assets:  Desire for Improvement Social Support  ADL's:  Intact  Cognition: WNL  Sleep:  Fair   Screenings: GAD-7    Flowsheet Row Office Visit from 10/27/2022 in [facility] Primary Care & Sports Medicine at [facility] Office Visit from 06/29/2022 in [facility] Primary Care & Sports Medicine at [facility]  Total GAD-7 Score 5 19      PHQ2-9    Flowsheet Row Office Visit from 12/30/2022 in [facility] at [facility] Office Visit from 11/04/2022 in [facility] Primary Care & Sports Medicine at [facility] Office Visit from 10/27/2022 in [facility] Primary Care & Sports Medicine at [facility] Office Visit from 06/29/2022 in [facility] Primary Care & Sports Medicine at [facility] Total Score 2 5 3 6   PHQ-9 Total Score 9 -- 15 21      Flowsheet Row ED from 01/28/2023 in [facility] at  [facility] Office Visit from 12/30/2022 in [facility] at [facility] ED from 07/28/2022 in [facility] at [facility] RISK CATEGORY No Risk Error: Question 6 not populated Error: Question 6 not populated       Assessment and Plan: as follows Prior documentation reviewed  Bipolar disorder ; current episode depression:  Improved and mood better, continue gaba and wellbutrin, discussed sobriety  GAD: improved, continue gaba   Alcohol use disorder;  sober now more then 4 weeks, continue AA and abstinence. Gaba helping with anxiety will continue . Has gabapentin prescription prescribed by PCP.      We did discuss alcohol effect of memory and can cause disturbance in sleep to work on sleep hygiene can now continue as of now for trazodone to   FU 2 m or earlier if needed, recommend therapy Direct care time 20 minutes  including chart review, face to face, documentation   Collaboration of Care: Primary Care Provider AEB reviewed notes and referral  Patient/Guardian was advised Release of Information must be obtained prior to any record release in order to collaborate their care with an outside provider. Patient/Guardian was advised if they have not already done so to contact the registration department to sign all necessary forms in order for [place] to release information regarding their care.   Consent: Patient/Guardian gives verbal consent for treatment and assignment of benefits for services provided during this visit. Patient/Guardian expressed understanding and agreed to proceed.   Thresa Ross, MD 7/9/20242:58 PM

## 2023-02-02 ENCOUNTER — Ambulatory Visit (INDEPENDENT_AMBULATORY_CARE_PROVIDER_SITE_OTHER): Payer: Medicare Other | Admitting: Family Medicine

## 2023-02-02 ENCOUNTER — Encounter: Payer: Self-pay | Admitting: Family Medicine

## 2023-02-02 ENCOUNTER — Telehealth (HOSPITAL_COMMUNITY): Payer: Self-pay | Admitting: Psychiatry

## 2023-02-02 VITALS — BP 124/88 | HR 74 | Resp 20 | Ht 62.0 in | Wt 168.8 lb

## 2023-02-02 DIAGNOSIS — J069 Acute upper respiratory infection, unspecified: Secondary | ICD-10-CM | POA: Insufficient documentation

## 2023-02-02 DIAGNOSIS — F439 Reaction to severe stress, unspecified: Secondary | ICD-10-CM

## 2023-02-02 DIAGNOSIS — M546 Pain in thoracic spine: Secondary | ICD-10-CM

## 2023-02-02 NOTE — Assessment & Plan Note (Signed)
Patient says her back pain has greatly improved since doing physical therapy she did also have a back injection that helped as well.

## 2023-02-02 NOTE — Progress Notes (Signed)
Established patient visit   Patient: Lindsay [name]   DOB: November 06, 1955   67 y.o. Female  MRN: [id number] Visit Date: 02/02/2023  Today's healthcare provider: Charlton Amor, DO   Chief Complaint  Patient presents with   Follow-up    Pt comes in to f/u on her MDD, back pain    SUBJECTIVE    Chief Complaint  Patient presents with   Follow-up    Pt comes in to f/u on her MDD, back pain   HPI  Pt presents for follow up on back pain. It is doing much better with back injections and physical therapy.  Pt is having a cough. She was seen in UC and started on antibiotics.   Review of Systems  Constitutional:  Negative for activity change, fatigue and fever.  Respiratory:  Negative for cough and shortness of breath.   Cardiovascular:  Negative for chest pain.  Gastrointestinal:  Negative for abdominal pain.  Genitourinary:  Negative for difficulty urinating.       Current Meds  Medication Sig   acetaminophen (TYLENOL) 325 MG tablet Take 2 tablets (650 mg total) by mouth every 6 (six) hours as needed for mild pain (or Fever >/= 101).   amLODipine (NORVASC) 2.5 MG tablet Take 1 tablet (2.5 mg total) by mouth daily.   azelastine (OPTIVAR) 0.05 % ophthalmic solution 1 drop 2 (two) times daily.   B Complex-C (SUPER B COMPLEX PO) Take by mouth.   BIOTIN PO Take 1 tablet by mouth daily.   buPROPion ER (WELLBUTRIN SR) 100 MG 12 hr tablet Take 1 tablet (100 mg total) by mouth 2 (two) times daily.   Cholecalciferol 20 MCG (800 UNIT) TABS Take 1 tablet by mouth daily.   clopidogrel (PLAVIX) 75 MG tablet Take 1 tablet (75 mg total) by mouth daily.   Cyanocobalamin (B-12 PO) Take 1 tablet by mouth daily.   diclofenac Sodium (VOLTAREN) 1 % GEL Apply topically as needed.   esomeprazole (NEXIUM 24HR CLEAR MINIS) 20 MG capsule Take 1 capsule (20 mg total) by mouth daily before breakfast.   Evolocumab (REPATHA SURECLICK) 140 MG/ML SOAJ Inject 140 mg into the skin every 14 (fourteen) days.    gabapentin (NEURONTIN) 100 MG capsule Take 3 capsules by mouth at bedtime.   HYDROcodone-acetaminophen (NORCO/VICODIN) 5-325 MG tablet Take 2 tablets by mouth as needed for moderate pain.   losartan (COZAAR) 50 MG tablet TAKE ONE TABLET BY MOUTH ONE TIME DAILY   methylPREDNISolone (MEDROL DOSEPAK) 4 MG TBPK tablet Take as instructed on pill pack   metoprolol tartrate (LOPRESSOR) 25 MG tablet TAKE ONE-HALF TABLET BY MOUTH TWICE A DAY .   nitroGLYCERIN (NITROSTAT) 0.4 MG SL tablet Place 1 tablet (0.4 mg total) under the tongue as needed for chest pain.   nystatin (MYCOSTATIN) 100000 UNIT/ML suspension Take 5 mLs (500,000 Units total) by mouth 4 (four) times daily.   Omega-3 Fatty Acids (OMEGA 3 PO) Take 1 capsule by mouth daily.   penicillin v potassium (VEETID) 500 MG tablet Take 1 tablet (500 mg total) by mouth in the morning and at bedtime for 10 days.   Plant Sterols and Stanols (CHOLESTOFF PLUS PO) Take by mouth daily.   Probiotic Product (PROBIOTIC ADVANCED PO) Take by mouth daily.   tiZANidine (ZANAFLEX) 4 MG capsule Take 4 mg by mouth 3 (three) times daily.   traZODone (DESYREL) 50 MG tablet Take 25 mg by mouth at bedtime.    OBJECTIVE    BP 124/88 (  BP Location: Left Arm, Patient Position: Sitting, Cuff Size: Normal)   Pulse 74   Resp 20   Ht 5\' 2"  (1.575 m)   Wt 168 lb 12 oz (76.5 kg)   SpO2 98%   BMI 30.86 kg/m   Physical Exam Vitals and nursing note reviewed.  Constitutional:      General: She is not in acute distress.    Appearance: Normal appearance.  HENT:     Head: Normocephalic and atraumatic.     Right Ear: External ear normal.     Left Ear: External ear normal.     Nose: Nose normal.  Eyes:     Conjunctiva/sclera: Conjunctivae normal.  Cardiovascular:     Rate and Rhythm: Normal rate and regular rhythm.  Pulmonary:     Effort: Pulmonary effort is normal.     Breath sounds: Normal breath sounds.  Neurological:     General: No focal deficit present.      Mental Status: She is alert and oriented to person, place, and time.  Psychiatric:        Mood and Affect: Mood normal.        Behavior: Behavior normal.        Thought Content: Thought content normal.        Judgment: Judgment normal.          ASSESSMENT & PLAN    Problem List Items Addressed This Visit       Respiratory   Viral upper respiratory tract infection - Primary    Pleasant 67 year old presents today for follow-up.  She also is having some residual cough like symptoms.  This sounds viral in nature.  She has currently been on antibiotics and has not been feeling any better with her antibiotics        Other   Acute thoracic back pain    Patient says her back pain has greatly improved since doing physical therapy she did also have a back injection that helped as well.      Stress    Very pleasant 67 year old female presents today we had a long discussion about stress in her life.  Her back pain has been limiting her to her shifts at the [employer].  She is currently only doing 1 or 2 shifts a week right now.  This has caused some financial stress because her paycheck has been much decreased.  She has cats that have been sick that require vet visits.  She is also trying to get her house in order and says that it is a mess.  She has just been dealing with a lot.  However, she is hopeful.  She is currently on Wellbutrin and feels like her SR tablets have been working well for her.      40 min was spent with the patient   Return in about 6 months (around 08/05/2023).      No orders of the defined types were placed in this encounter.   No orders of the defined types were placed in this encounter.    Charlton Amor, DO  [facility] at [facility] [phone] (phone) [phone] (fax)  [facility]

## 2023-02-02 NOTE — Assessment & Plan Note (Signed)
Very pleasant 67 year old female presents today we had a long discussion about stress in her life.  Her back pain has been limiting her to her shifts at the [employer].  She is currently only doing 1 or 2 shifts a week right now.  This has caused some financial stress because her paycheck has been much decreased.  She has cats that have been sick that require vet visits.  She is also trying to get her house in order and says that it is a mess.  She has just been dealing with a lot.  However, she is hopeful.  She is currently on Wellbutrin and feels like her SR tablets have been working well for her.

## 2023-02-02 NOTE — Telephone Encounter (Signed)
Patient called stating provider requested information during appointment yesterday. States she has 120 gabapentin (NEURONTIN) 100 MG capsules remaining in her supply which should last about 2 months. Stated the prescriber is Deneise Lever, FNP with [facility].

## 2023-02-02 NOTE — Assessment & Plan Note (Signed)
Pleasant 67 year old presents today for follow-up.  She also is having some residual cough like symptoms.  This sounds viral in nature.  She has currently been on antibiotics and has not been feeling any better with her antibiotics

## 2023-02-03 ENCOUNTER — Ambulatory Visit: Payer: Medicare Other | Admitting: Physical Therapy

## 2023-02-03 DIAGNOSIS — M25561 Pain in right knee: Secondary | ICD-10-CM | POA: Diagnosis not present

## 2023-02-03 DIAGNOSIS — G8929 Other chronic pain: Secondary | ICD-10-CM

## 2023-02-03 DIAGNOSIS — M25562 Pain in left knee: Secondary | ICD-10-CM | POA: Diagnosis not present

## 2023-02-03 DIAGNOSIS — R262 Difficulty in walking, not elsewhere classified: Secondary | ICD-10-CM

## 2023-02-03 DIAGNOSIS — M6281 Muscle weakness (generalized): Secondary | ICD-10-CM | POA: Diagnosis not present

## 2023-02-03 NOTE — Therapy (Signed)
OUTPATIENT PHYSICAL THERAPY LOWER EXTREMITY TREATMENT   Patient Name: Lindsay [name] MRN: [id number] DOB:31-May-1956, 67 y.o., female Today's Date: 02/03/2023  END OF SESSION:  PT End of Session - 02/03/23 1315     Visit Number 8    Number of Visits 12    Date for PT Re-Evaluation 02/09/23    Authorization - Visit Number 8    Progress Note Due on Visit 10    PT Start Time 1315    PT Stop Time 1400    PT Time Calculation (min) 45 min    Activity Tolerance Patient tolerated treatment well    Behavior During Therapy [facility] for tasks assessed/performed                 Past Medical History:  Diagnosis Date   Abnormal mammogram    Thick Tissue   Acquired trigger finger of right ring finger 04/20/2022   Arthritis    Bilateral wrist pain 04/20/2022   Bipolar 1 disorder    Cataract    OU   Cervical radiculopathy 10/14/2021   Chalazion of left lower eyelid 04/15/2022   Chest pain 02/11/2015   Chronic atrial fibrillation 09/08/2015   Colon polyp    Compressed cervical disc    Coronary artery disease involving native coronary artery with angina pectoris 03/24/2015   Dorsalgia 09/22/2018   Dysphagia 09/22/2018   Essential hypertension 03/03/2015   Generalized anxiety disorder    GERD without esophagitis 09/22/2018   Hematemesis 12/18/2019   Hepatic steatosis 04/22/2022   History of appendectomy 1963   Hypertensive retinopathy    OU   Irritable bowel syndrome without diarrhea 09/22/2018   Lactic acidosis 12/18/2019   LLQ pain 04/15/2022   Major depressive disorder    Migraine without status migrainosus, not intractable 09/22/2018   Mixed hyperlipidemia 05/07/2016   Non-ST elevation MI (NSTEMI) 07/26/2008   No cardiac catheterization at the time.   Obstructive sleep apnea    no CPAP use   Orthostasis    Osteoarthritis of carpometacarpal (CMC) joint of thumb 04/20/2022   Osteoarthritis of knee 09/22/2018   PAF (paroxysmal atrial fibrillation) 10/09/2018   Pain in joint  of right hip 07/03/2020   Pain in right knee 12/14/2018   Recurrent syncope    Rib pain 07/03/2020   Sciatica of right side 09/22/2018   Severe alcohol use disorder    Vitamin D deficiency    Past Surgical History:  Procedure Laterality Date   APPENDECTOMY     CARDIAC CATHETERIZATION N/A 03/25/2015   Procedure: Left Heart Cath and Coronary Angiography;  Surgeon: Marykay Lex, MD;  Location: [facility] INVASIVE CV LAB;  Service: Cardiovascular;  Laterality: N/A;   CORONARY STENT INTERVENTION N/A 10/09/2018   Procedure: CORONARY STENT INTERVENTION;  Surgeon: Corky Crafts, MD;  Location: [facility] INVASIVE CV LAB;  Service: Cardiovascular;  Laterality: N/A;   DILATATION & CURETTAGE/HYSTEROSCOPY WITH MYOSURE N/A 05/19/2016   Procedure: DILATATION & CURETTAGE/HYSTEROSCOPY WITH MYOSURE;  Surgeon: Geryl Rankins, MD;  Location: [facility];  Service: Gynecology;  Laterality: N/A;  Possible Myosure for polyps.   EYE SURGERY     LEFT HEART CATH AND CORONARY ANGIOGRAPHY N/A 10/09/2018   Procedure: LEFT HEART CATH AND CORONARY ANGIOGRAPHY;  Surgeon: Corky Crafts, MD;  Location: [facility] INVASIVE CV LAB;  Service: Cardiovascular;  Laterality: N/A;   MOUTH SURGERY     URETHRAL DILATION     Patient Active Problem List   Diagnosis Date Noted   Viral upper respiratory tract infection  02/02/2023   Stress 02/02/2023   Insomnia 12/23/2022   Blood clotting disorder (HCC) 10/28/2022   Neck pain 10/27/2022   Acute thoracic back pain 10/27/2022   Lumbar radiculopathy 05/26/2022   Degeneration of lumbar intervertebral disc 05/26/2022   Major depressive disorder    Severe alcohol use disorder    Generalized anxiety disorder    Obstructive sleep apnea    Hepatic steatosis 04/22/2022   Osteoarthritis of carpometacarpal (CMC) joint of thumb 04/20/2022   Acquired trigger finger of right ring finger 04/20/2022   Bilateral wrist pain 04/20/2022   Chalazion of left lower eyelid 04/15/2022   LLQ pain 04/15/2022    Bipolar 1 disorder (HCC)    Cervical radiculopathy 10/14/2021   Pain in joint of right hip 07/03/2020   Rib pain 07/03/2020   Recurrent syncope    Orthostasis    Lactic acidosis 12/18/2019   Hematemesis 12/18/2019   Pain in right knee 12/14/2018   PAF (paroxysmal atrial fibrillation) 10/09/2018   Dorsalgia 09/22/2018   Dysphagia 09/22/2018   GERD without esophagitis 09/22/2018   Irritable bowel syndrome without diarrhea 09/22/2018   Osteoarthritis of knee 09/22/2018   Polypharmacy 09/22/2018   Sciatica of right side 09/22/2018   Vitamin D deficiency 09/22/2018   Mixed hyperlipidemia 05/07/2016   Chronic atrial fibrillation (HCC) 09/08/2015   Coronary artery disease involving native coronary artery with angina pectoris 03/24/2015   Essential hypertension 03/03/2015   Chest pain 02/11/2015   Non-ST elevation MI (NSTEMI) (HCC) 07/26/2008    PCP: Tamera Punt  REFERRING PROVIDER: Tamera Punt  REFERRING DIAG: acute thoracic back pain  THERAPY DIAG:  Chronic pain of both knees  Difficulty in walking, not elsewhere classified  Muscle weakness (generalized)  Rationale for Evaluation and Treatment: Rehabilitation  ONSET DATE: 11/2022  SUBJECTIVE:   SUBJECTIVE STATEMENT: "I'm improving every time I come in." Pt is currently only doing 5 hours of work this week.   PERTINENT HISTORY: Cervical DDD Coccyx fracture From eval: Pt states her knees have bothered her for years but that they have been worsening lately. She states that 2-3 weeks ago her Lt knee "went out" on her while she was standing in a line. She states she can also hear "crunching" in her knees. Pt also has sciatica down Rt LE due to a fall 3 years ago where she broke her coccyx.  She saw MD who recommended physical therapy. Knee pain increases with prolonged standing and walking, back pain increases with lifting. Pain decreases with use of voltaren gel or meds  PAIN:  Are you having pain? Yes: NPRS scale: 1-2/10 currently,  7/10 after work/10 Pain location: Rt LE, bilat knees Pain description: burning, aching, sore Aggravating factors: prolonged standing and walking, lifting Relieving factors: meds  PRECAUTIONS: None  WEIGHT BEARING RESTRICTIONS: No  FALLS:  Has patient fallen in last 6 months? No  OCCUPATION: Conservation officer, nature at [employer], lots of walking, lifting and standing  PLOF: Independent  PATIENT GOALS: be able to walk for exercise, return fully to work with decreased pain  NEXT MD VISIT: July 2024  OBJECTIVE: (Measures in this section from initial evaluation unless otherwise noted)   MUSCLE LENGTH: Hamstrings: Right WFL deg; Left WFL deg  POSTURE: decreased lumbar lordosis  PALPATION: Pain with lateral patellar mobs Lt knee Crepitus with patellar mobs bilat Pain with palpation Rt glutes near sacrum TTP with UPAs Rt SIJ  Lumbar ROM: flex WFL   Ext 75%   Rt lateral flexion 50% pain   Lt lateral  flexion WFL   Rt rotation WFL pain end range   Lt rotation WFL pain end range  LOWER EXTREMITY ROM: WFL bilat  LOWER EXTREMITY MMT:  MMT Right eval Left eval Right 02/03/23 Left 02/03/23  Hip flexion 4- 4 SLR with ER 3/5 5 5   Hip extension 3+ 3+ 4+ 4+  Hip abduction 4- 4 4 4+  Hip adduction      Hip internal rotation      Hip external rotation 4 4 4+ 4+  Knee flexion 4 4 5  4+  Knee extension 4 4 5 5   Ankle dorsiflexion      Ankle plantarflexion      Ankle inversion      Ankle eversion       (Blank rows = not tested)  LOWER EXTREMITY SPECIAL TESTS:  SLR (-) bilat  GAIT: Distance walked: 100' Assistive device utilized: Single point cane Level of assistance: Modified independence Comments: antalgic gait, decreased cadence   TODAY'S TREATMENT:     [facility] Adult PT Treatment:                                                DATE: 02/03/23 Therapeutic Exercise: Recumbent bike L4 x 5 min Adductor stretch x 30 sec Sit <> stand 15# KB 2 x 10 Modified dead lift 15# KB 2 x 10 Row  reactive iso at cables 15# 2x10 Shoulder ext reactive iso at cables 5# 2x10 Suitcase carry 10# x 2 laps Farmers carry 10# x 2 laps Staggered stance deadlift 10# x10 Therapeutic Activity: Rechecked goals   [facility] Adult PT Treatment:                                                DATE: 02/01/23 Therapeutic Exercise: Nustep L6 x 5 min Sit <> stand 10# KB 2 x 10 Modified dead lift 10# KB x 15 Runners step up 6'' x 10 bilat Bent over hip ext with knee bent 2 x 10 Resisted walking backward 10# (2 handle row position) x 10 Suitcase carry 10# x 2 laps Farmers carry 10# x 2 laps Seated HS stretch 2 x 30 sec Seated piriformis stretch 2 x 30 sec   [facility] Adult PT Treatment:                                                DATE: 01/25/23 Therapeutic Exercise: Seated HS stretch 2 x 30 sec Seated piriformis stretch 2 x 30 sec Supine figure 4 bridge 2 x 10 Dead bug with physioball x 10 STS 10# KB x 10 Modified dead lift 10# KB 2 x 10 Resisted walking laterally 10#  bilat Bent over hip ext with knee flexion 2 x 10 bilat    [facility] Adult PT Treatment:                                                DATE: 01/13/23 Therapeutic Exercise: Seated hamstring stretch 2 x 30  sec Seated figure 4 stretch 2 x 30 sec Supine figure 4 bridge 2x10 Deadbug 2x10 Standing hip ext green TB 2x10 Standing side step green TB 2x10    PATIENT EDUCATION:  Education details: PT POC and goals, HEP Person educated: Patient Education method: Explanation, Demonstration, and Handouts Education comprehension: verbalized understanding and returned demonstration  HOME EXERCISE PROGRAM: Access Code: [email or web address] URL: [email or web address] Date: 01/25/2023 Prepared by: Reggy Eye  Exercises - Figure 4 Bridge  - 1 x daily - 7 x weekly - 2 sets - 10 reps - Straight Leg Raise with External Rotation  - 1 x daily - 7 x weekly - 2 sets - 10 reps - Hip Extension with Resistance Loop  - 1 x daily - 7 x weekly - 3  sets - 10 reps - Diagonal Hip Extension with Resistance  - 1 x daily - 7 x weekly - 3 sets - 10 reps - Supine Hamstring Stretch with Strap  - 1 x daily - 7 x weekly - 2 sets - 30 sec hold - Supine Quadriceps Stretch with Strap on Table  - 1 x daily - 7 x weekly - 2 sets - 30 sec hold - Supine Piriformis Stretch with Foot on Ground  - 1 x daily - 7 x weekly - 2 sets - 30 sec hold - Clamshell with Resistance  - 1 x daily - 7 x weekly - 2 sets - 10 reps - Seated Piriformis Stretch with Trunk Bend  - 1 x daily - 7 x weekly - 2 sets - 30 sec hold - Seated Hamstring Stretch  - 1 x daily - 7 x weekly - 2 sets - 30 sec hold - Dead Bug  - 1 x daily - 7 x weekly - 2 sets - 10 reps - Side Stepping with Resistance at Ankles and Counter Support  - 1 x daily - 7 x weekly - 2 sets - 10 reps - Hip Extension with Single Leg Support Prone on Table Edge  - 1 x daily - 7 x weekly - 3 sets - 10 reps - Half Deadlift with Kettlebell  - 1 x daily - 7 x weekly - 3 sets - 10 reps - Sit to Stand  - 1 x daily - 7 x weekly - 3 sets - 10 reps  ASSESSMENT:  CLINICAL IMPRESSION: Continued to work on progressive strengthening. Increased weight using cables and KBs with good tolerance. She continues to progress well towards her goals.   OBJECTIVE IMPAIRMENTS: decreased activity tolerance, difficulty walking, decreased strength, increased muscle spasms, and pain.     GOALS: Goals reviewed with patient? Yes  SHORT TERM GOALS: Target date: 01/12/2023   Pt will be independent with initial HEP Baseline: Goal status: MET   LONG TERM GOALS: Target date: 02/09/2023   Pt will be independent with advanced HEP Baseline:  Goal status: IN PROGRESS  2.  Pt will improve bilat LE strength to 4+/5 to improve standing and walking tolerance Baseline:  02/03/23: See MMT chart above Goal status: IN PROGRESS  3.  Pt will tolerate lifting 25# floor to chest with good mechanics and pain <= 2/10 Baseline:  02/03/23: 15# sit to  stand and deadlifts Goal status: IN PROGRESS  4.  Pt will tolerate working full shift with knee pain <= 2/10 Baseline:  02/03/23: Has not been assigned more than 5 hours of work still Goal status: IN PROGRESS    PLAN:  PT FREQUENCY: 2x/week  PT DURATION: 6 weeks  PLANNED INTERVENTIONS: Therapeutic exercises, Therapeutic activity, Neuromuscular re-education, Balance training, Gait training, Patient/Family education, Self Care, Joint mobilization, Aquatic Therapy, Dry Needling, Electrical stimulation, Cryotherapy, Moist heat, Taping, Vasopneumatic device, Traction, Ultrasound, Ionotophoresis 4mg /ml Dexamethasone, Manual therapy, and Re-evaluation  PLAN FOR NEXT SESSION: check goals, standing LE and core strength, lifting mechanics   Shena Vinluan April Ma L Kayin Osment, PT 02/03/2023, 1:16 PM

## 2023-02-06 ENCOUNTER — Other Ambulatory Visit: Payer: Self-pay | Admitting: Internal Medicine

## 2023-02-06 DIAGNOSIS — I10 Essential (primary) hypertension: Secondary | ICD-10-CM

## 2023-02-08 ENCOUNTER — Ambulatory Visit: Payer: Medicare Other | Admitting: Physical Therapy

## 2023-02-08 ENCOUNTER — Encounter: Payer: Self-pay | Admitting: Physical Therapy

## 2023-02-08 DIAGNOSIS — M25561 Pain in right knee: Secondary | ICD-10-CM | POA: Diagnosis not present

## 2023-02-08 DIAGNOSIS — M6281 Muscle weakness (generalized): Secondary | ICD-10-CM | POA: Diagnosis not present

## 2023-02-08 DIAGNOSIS — R262 Difficulty in walking, not elsewhere classified: Secondary | ICD-10-CM

## 2023-02-08 DIAGNOSIS — G8929 Other chronic pain: Secondary | ICD-10-CM

## 2023-02-08 DIAGNOSIS — M25562 Pain in left knee: Secondary | ICD-10-CM | POA: Diagnosis not present

## 2023-02-08 NOTE — Therapy (Signed)
OUTPATIENT PHYSICAL THERAPY LOWER EXTREMITY TREATMENT AND RECERTIFICATION   Patient Name: Lindsay [name] MRN: [id number] DOB:Nov 08, 1955, 67 y.o., female Today's Date: 02/08/2023  END OF SESSION:  PT End of Session - 02/08/23 1435     Visit Number 9    Number of Visits 12    Date for PT Re-Evaluation 03/22/23    Authorization Type Medicare    Authorization - Visit Number 9    Progress Note Due on Visit 10    PT Start Time 1357    PT Stop Time 1436    PT Time Calculation (min) 39 min    Activity Tolerance Patient tolerated treatment well    Behavior During Therapy [facility] for tasks assessed/performed                  Past Medical History:  Diagnosis Date   Abnormal mammogram    Thick Tissue   Acquired trigger finger of right ring finger 04/20/2022   Arthritis    Bilateral wrist pain 04/20/2022   Bipolar 1 disorder    Cataract    OU   Cervical radiculopathy 10/14/2021   Chalazion of left lower eyelid 04/15/2022   Chest pain 02/11/2015   Chronic atrial fibrillation 09/08/2015   Colon polyp    Compressed cervical disc    Coronary artery disease involving native coronary artery with angina pectoris 03/24/2015   Dorsalgia 09/22/2018   Dysphagia 09/22/2018   Essential hypertension 03/03/2015   Generalized anxiety disorder    GERD without esophagitis 09/22/2018   Hematemesis 12/18/2019   Hepatic steatosis 04/22/2022   History of appendectomy 1963   Hypertensive retinopathy    OU   Irritable bowel syndrome without diarrhea 09/22/2018   Lactic acidosis 12/18/2019   LLQ pain 04/15/2022   Major depressive disorder    Migraine without status migrainosus, not intractable 09/22/2018   Mixed hyperlipidemia 05/07/2016   Non-ST elevation MI (NSTEMI) 07/26/2008   No cardiac catheterization at the time.   Obstructive sleep apnea    no CPAP use   Orthostasis    Osteoarthritis of carpometacarpal (CMC) joint of thumb 04/20/2022   Osteoarthritis of knee 09/22/2018   PAF  (paroxysmal atrial fibrillation) 10/09/2018   Pain in joint of right hip 07/03/2020   Pain in right knee 12/14/2018   Recurrent syncope    Rib pain 07/03/2020   Sciatica of right side 09/22/2018   Severe alcohol use disorder    Vitamin D deficiency    Past Surgical History:  Procedure Laterality Date   APPENDECTOMY     CARDIAC CATHETERIZATION N/A 03/25/2015   Procedure: Left Heart Cath and Coronary Angiography;  Surgeon: Marykay Lex, MD;  Location: [facility] INVASIVE CV LAB;  Service: Cardiovascular;  Laterality: N/A;   CORONARY STENT INTERVENTION N/A 10/09/2018   Procedure: CORONARY STENT INTERVENTION;  Surgeon: Corky Crafts, MD;  Location: [facility] INVASIVE CV LAB;  Service: Cardiovascular;  Laterality: N/A;   DILATATION & CURETTAGE/HYSTEROSCOPY WITH MYOSURE N/A 05/19/2016   Procedure: DILATATION & CURETTAGE/HYSTEROSCOPY WITH MYOSURE;  Surgeon: Geryl Rankins, MD;  Location: [facility];  Service: Gynecology;  Laterality: N/A;  Possible Myosure for polyps.   EYE SURGERY     LEFT HEART CATH AND CORONARY ANGIOGRAPHY N/A 10/09/2018   Procedure: LEFT HEART CATH AND CORONARY ANGIOGRAPHY;  Surgeon: Corky Crafts, MD;  Location: [facility] INVASIVE CV LAB;  Service: Cardiovascular;  Laterality: N/A;   MOUTH SURGERY     URETHRAL DILATION     Patient Active Problem List   Diagnosis  Date Noted   Viral upper respiratory tract infection 02/02/2023   Stress 02/02/2023   Insomnia 12/23/2022   Blood clotting disorder (HCC) 10/28/2022   Neck pain 10/27/2022   Acute thoracic back pain 10/27/2022   Lumbar radiculopathy 05/26/2022   Degeneration of lumbar intervertebral disc 05/26/2022   Major depressive disorder    Severe alcohol use disorder    Generalized anxiety disorder    Obstructive sleep apnea    Hepatic steatosis 04/22/2022   Osteoarthritis of carpometacarpal (CMC) joint of thumb 04/20/2022   Acquired trigger finger of right ring finger 04/20/2022   Bilateral wrist pain 04/20/2022   Chalazion  of left lower eyelid 04/15/2022   LLQ pain 04/15/2022   Bipolar 1 disorder (HCC)    Cervical radiculopathy 10/14/2021   Pain in joint of right hip 07/03/2020   Rib pain 07/03/2020   Recurrent syncope    Orthostasis    Lactic acidosis 12/18/2019   Hematemesis 12/18/2019   Pain in right knee 12/14/2018   PAF (paroxysmal atrial fibrillation) 10/09/2018   Dorsalgia 09/22/2018   Dysphagia 09/22/2018   GERD without esophagitis 09/22/2018   Irritable bowel syndrome without diarrhea 09/22/2018   Osteoarthritis of knee 09/22/2018   Polypharmacy 09/22/2018   Sciatica of right side 09/22/2018   Vitamin D deficiency 09/22/2018   Mixed hyperlipidemia 05/07/2016   Chronic atrial fibrillation (HCC) 09/08/2015   Coronary artery disease involving native coronary artery with angina pectoris 03/24/2015   Essential hypertension 03/03/2015   Chest pain 02/11/2015   Non-ST elevation MI (NSTEMI) (HCC) 07/26/2008    PCP: Tamera Punt  REFERRING PROVIDER: Tamera Punt  REFERRING DIAG: acute thoracic back pain  THERAPY DIAG:  Chronic pain of both knees  Difficulty in walking, not elsewhere classified  Muscle weakness (generalized)  Rationale for Evaluation and Treatment: Rehabilitation  ONSET DATE: 11/2022  SUBJECTIVE:   SUBJECTIVE STATEMENT: Pt states she felt "great" over the weekend. She states her Rt knee started hurting yesterday, she thinks maybe she overdid it on the weekend  PERTINENT HISTORY: Cervical DDD Coccyx fracture From eval: Pt states her knees have bothered her for years but that they have been worsening lately. She states that 2-3 weeks ago her Lt knee "went out" on her while she was standing in a line. She states she can also hear "crunching" in her knees. Pt also has sciatica down Rt LE due to a fall 3 years ago where she broke her coccyx.  She saw MD who recommended physical therapy. Knee pain increases with prolonged standing and walking, back pain increases with lifting. Pain  decreases with use of voltaren gel or meds  PAIN:  Are you having pain? Yes: NPRS scale: 1-2/10 currently, 7/10 after work/10 Pain location: Rt LE, bilat knees Pain description: burning, aching, sore Aggravating factors: prolonged standing and walking, lifting Relieving factors: meds  PRECAUTIONS: None  WEIGHT BEARING RESTRICTIONS: No  FALLS:  Has patient fallen in last 6 months? No  OCCUPATION: Conservation officer, nature at [employer], lots of walking, lifting and standing  PLOF: Independent  PATIENT GOALS: be able to walk for exercise, return fully to work with decreased pain  NEXT MD VISIT: July 2024  OBJECTIVE: (Measures in this section from initial evaluation unless otherwise noted)   MUSCLE LENGTH: Hamstrings: Right WFL deg; Left WFL deg  POSTURE: decreased lumbar lordosis  PALPATION: Pain with lateral patellar mobs Lt knee Crepitus with patellar mobs bilat Pain with palpation Rt glutes near sacrum TTP with UPAs Rt SIJ  Lumbar ROM: flex [facility]  Ext 75%   Rt lateral flexion 50% pain   Lt lateral flexion WFL   Rt rotation WFL pain end range   Lt rotation WFL pain end range  LOWER EXTREMITY ROM: WFL bilat  LOWER EXTREMITY MMT:  MMT Right eval Left eval Right 02/03/23 Left 02/03/23  Hip flexion 4- 4 SLR with ER 3/5 5 5   Hip extension 3+ 3+ 4+ 4+  Hip abduction 4- 4 4 4+  Hip adduction      Hip internal rotation      Hip external rotation 4 4 4+ 4+  Knee flexion 4 4 5  4+  Knee extension 4 4 5 5   Ankle dorsiflexion      Ankle plantarflexion      Ankle inversion      Ankle eversion       (Blank rows = not tested)  LOWER EXTREMITY SPECIAL TESTS:  SLR (-) bilat  GAIT: Distance walked: 100' Assistive device utilized: Single point cane Level of assistance: Modified independence Comments: antalgic gait, decreased cadence   TODAY'S TREATMENT:     [facility] Adult PT Treatment:                                                DATE: 02/08/23 Therapeutic Exercise: Recumbent  L4 x 5 min Sit <> stand 15# KB 2 x 10 Modified dead lift 15# KB 2 x 10 Suitcase carry 15# KB Farmers carry 15# KB Step up 6'' 2 x 10 with 10# KB Row reactive iso at cables 15# 2x10 Reactive iso shoulder ext 10# x 15 Hip abd/add on slider 2 x 10 bilat Seated HS stretch 2 x 30 sec Seated piriformis stretch 2 x 30 sec Seated HS stretch 2 x 30 sec   [facility] Adult PT Treatment:                                                DATE: 02/03/23 Therapeutic Exercise: Recumbent bike L4 x 5 min Adductor stretch x 30 sec Sit <> stand 15# KB 2 x 10 Modified dead lift 15# KB 2 x 10 Row reactive iso at cables 15# 2x10 Shoulder ext reactive iso at cables 5# 2x10 Suitcase carry 10# x 2 laps Farmers carry 10# x 2 laps Staggered stance deadlift 10# x10 Therapeutic Activity: Rechecked goals   [facility] Adult PT Treatment:                                                DATE: 02/01/23 Therapeutic Exercise: Nustep L6 x 5 min Sit <> stand 10# KB 2 x 10 Modified dead lift 10# KB x 15 Runners step up 6'' x 10 bilat Bent over hip ext with knee bent 2 x 10 Resisted walking backward 10# (2 handle row position) x 10 Suitcase carry 10# x 2 laps Farmers carry 10# x 2 laps Seated HS stretch 2 x 30 sec Seated piriformis stretch 2 x 30 sec   [facility] Adult PT Treatment:  DATE: 01/25/23 Therapeutic Exercise: Seated HS stretch 2 x 30 sec Seated piriformis stretch 2 x 30 sec Supine figure 4 bridge 2 x 10 Dead bug with physioball x 10 STS 10# KB x 10 Modified dead lift 10# KB 2 x 10 Resisted walking laterally 10#  bilat Bent over hip ext with knee flexion 2 x 10 bilat   PATIENT EDUCATION:  Education details: PT POC and goals, HEP Person educated: Patient Education method: Explanation, Demonstration, and Handouts Education comprehension: verbalized understanding and returned demonstration  HOME EXERCISE PROGRAM: Access Code: [email or web address] URL:  [email or web address] Date: 01/25/2023 Prepared by: Reggy Eye  Exercises - Figure 4 Bridge  - 1 x daily - 7 x weekly - 2 sets - 10 reps - Straight Leg Raise with External Rotation  - 1 x daily - 7 x weekly - 2 sets - 10 reps - Hip Extension with Resistance Loop  - 1 x daily - 7 x weekly - 3 sets - 10 reps - Diagonal Hip Extension with Resistance  - 1 x daily - 7 x weekly - 3 sets - 10 reps - Supine Hamstring Stretch with Strap  - 1 x daily - 7 x weekly - 2 sets - 30 sec hold - Supine Quadriceps Stretch with Strap on Table  - 1 x daily - 7 x weekly - 2 sets - 30 sec hold - Supine Piriformis Stretch with Foot on Ground  - 1 x daily - 7 x weekly - 2 sets - 30 sec hold - Clamshell with Resistance  - 1 x daily - 7 x weekly - 2 sets - 10 reps - Seated Piriformis Stretch with Trunk Bend  - 1 x daily - 7 x weekly - 2 sets - 30 sec hold - Seated Hamstring Stretch  - 1 x daily - 7 x weekly - 2 sets - 30 sec hold - Dead Bug  - 1 x daily - 7 x weekly - 2 sets - 10 reps - Side Stepping with Resistance at Ankles and Counter Support  - 1 x daily - 7 x weekly - 2 sets - 10 reps - Hip Extension with Single Leg Support Prone on Table Edge  - 1 x daily - 7 x weekly - 3 sets - 10 reps - Half Deadlift with Kettlebell  - 1 x daily - 7 x weekly - 3 sets - 10 reps - Sit to Stand  - 1 x daily - 7 x weekly - 3 sets - 10 reps  ASSESSMENT:  CLINICAL IMPRESSION: Pt is progressing well towards strength and pain goals. She still is unable to work full work shifts due to pain. She will continue to benefit from skilled PT to work towards remaining goals.  OBJECTIVE IMPAIRMENTS: decreased activity tolerance, difficulty walking, decreased strength, increased muscle spasms, and pain.     GOALS: Goals reviewed with patient? Yes  SHORT TERM GOALS: Target date: 01/12/2023   Pt will be independent with initial HEP Baseline: Goal status: MET   LONG TERM GOALS: Target date: 03/22/2023     Pt  will be independent with advanced HEP Baseline:  Goal status: IN PROGRESS  2.  Pt will improve bilat LE strength to 4+/5 to improve standing and walking tolerance Baseline:  02/03/23: See MMT chart above Goal status: IN PROGRESS  3.  Pt will tolerate lifting 25# floor to chest with good mechanics and pain <= 2/10 Baseline:  02/03/23: 15# sit to stand and deadlifts Goal status: IN PROGRESS  4.  Pt will tolerate working full shift with knee pain <= 2/10 Baseline:  02/03/23: Has not been assigned more than 5 hours of work still Goal status: IN PROGRESS    PLAN:  PT FREQUENCY: 2x/week  PT DURATION: 6 weeks  PLANNED INTERVENTIONS: Therapeutic exercises, Therapeutic activity, Neuromuscular re-education, Balance training, Gait training, Patient/Family education, Self Care, Joint mobilization, Aquatic Therapy, Dry Needling, Electrical stimulation, Cryotherapy, Moist heat, Taping, Vasopneumatic device, Traction, Ultrasound, Ionotophoresis 4mg /ml Dexamethasone, Manual therapy, and Re-evaluation  PLAN FOR NEXT SESSION: standing LE and core strength, lifting mechanics   Kiril Hippe, PT 02/08/2023, 2:36 PM

## 2023-02-15 ENCOUNTER — Other Ambulatory Visit: Payer: Self-pay

## 2023-02-15 ENCOUNTER — Encounter: Payer: Self-pay | Admitting: Physical Therapy

## 2023-02-15 ENCOUNTER — Ambulatory Visit: Payer: Medicare Other | Admitting: Physical Therapy

## 2023-02-15 DIAGNOSIS — G8929 Other chronic pain: Secondary | ICD-10-CM

## 2023-02-15 DIAGNOSIS — R262 Difficulty in walking, not elsewhere classified: Secondary | ICD-10-CM | POA: Diagnosis not present

## 2023-02-15 DIAGNOSIS — M25561 Pain in right knee: Secondary | ICD-10-CM | POA: Diagnosis not present

## 2023-02-15 DIAGNOSIS — M25562 Pain in left knee: Secondary | ICD-10-CM | POA: Diagnosis not present

## 2023-02-15 DIAGNOSIS — I25118 Atherosclerotic heart disease of native coronary artery with other forms of angina pectoris: Secondary | ICD-10-CM

## 2023-02-15 DIAGNOSIS — M6281 Muscle weakness (generalized): Secondary | ICD-10-CM

## 2023-02-15 MED ORDER — CLOPIDOGREL BISULFATE 75 MG PO TABS
75.0000 mg | ORAL_TABLET | Freq: Every day | ORAL | 3 refills | Status: DC
Start: 2023-02-15 — End: 2024-05-01

## 2023-02-15 NOTE — Therapy (Signed)
OUTPATIENT PHYSICAL THERAPY LOWER EXTREMITY TREATMENT and 10th visit note   Patient Name: Lindsay [name] MRN: [id number] DOB:[birth year], 67 y.o., female Today's Date: 02/15/2023 DATES OF SERVICE: 12/29/22-02/15/23 END OF SESSION:  PT End of Session - 02/15/23 1355     Visit Number 10    Number of Visits 24    Date for PT Re-Evaluation 03/22/23    Authorization Type Medicare    Authorization - Visit Number 10    Progress Note Due on Visit 20    PT Start Time 1315    PT Stop Time 1356    PT Time Calculation (min) 41 min    Activity Tolerance Patient tolerated treatment well    Behavior During Therapy [facility] for tasks assessed/performed                   Past Medical History:  Diagnosis Date   Abnormal mammogram    Thick Tissue   Acquired trigger finger of right ring finger 04/20/2022   Arthritis    Bilateral wrist pain 04/20/2022   Bipolar 1 disorder    Cataract    OU   Cervical radiculopathy 10/14/2021   Chalazion of left lower eyelid 04/15/2022   Chest pain 02/11/2015   Chronic atrial fibrillation 09/08/2015   Colon polyp    Compressed cervical disc    Coronary artery disease involving native coronary artery with angina pectoris 03/24/2015   Dorsalgia 09/22/2018   Dysphagia 09/22/2018   Essential hypertension 03/03/2015   Generalized anxiety disorder    GERD without esophagitis 09/22/2018   Hematemesis 12/18/2019   Hepatic steatosis 04/22/2022   History of appendectomy 1963   Hypertensive retinopathy    OU   Irritable bowel syndrome without diarrhea 09/22/2018   Lactic acidosis 12/18/2019   LLQ pain 04/15/2022   Major depressive disorder    Migraine without status migrainosus, not intractable 09/22/2018   Mixed hyperlipidemia 05/07/2016   Non-ST elevation MI (NSTEMI) 07/26/2008   [name] cardiac catheterization at the time.   Obstructive sleep apnea    [name] CPAP use   Orthostasis    Osteoarthritis of carpometacarpal (CMC) joint of thumb 04/20/2022    Osteoarthritis of knee 09/22/2018   PAF (paroxysmal atrial fibrillation) 10/09/2018   Pain in joint of right hip 07/03/2020   Pain in right knee 12/14/2018   Recurrent syncope    Rib pain 07/03/2020   Sciatica of right side 09/22/2018   Severe alcohol use disorder    Vitamin D deficiency    Past Surgical History:  Procedure Laterality Date   APPENDECTOMY     CARDIAC CATHETERIZATION N/A 03/25/2015   Procedure: Left Heart Cath and Coronary Angiography;  Surgeon: Marykay Lex, MD;  Location: [facility] INVASIVE CV LAB;  Service: Cardiovascular;  Laterality: N/A;   CORONARY STENT INTERVENTION N/A 10/09/2018   Procedure: CORONARY STENT INTERVENTION;  Surgeon: Corky Crafts, MD;  Location: [facility] INVASIVE CV LAB;  Service: Cardiovascular;  Laterality: N/A;   DILATATION & CURETTAGE/HYSTEROSCOPY WITH MYOSURE N/A 05/19/2016   Procedure: DILATATION & CURETTAGE/HYSTEROSCOPY WITH MYOSURE;  Surgeon: Geryl Rankins, MD;  Location: [facility];  Service: Gynecology;  Laterality: N/A;  Possible Myosure for polyps.   EYE SURGERY     LEFT HEART CATH AND CORONARY ANGIOGRAPHY N/A 10/09/2018   Procedure: LEFT HEART CATH AND CORONARY ANGIOGRAPHY;  Surgeon: Corky Crafts, MD;  Location: [facility] INVASIVE CV LAB;  Service: Cardiovascular;  Laterality: N/A;   MOUTH SURGERY     URETHRAL DILATION     Patient  Active Problem List   Diagnosis Date Noted   Viral upper respiratory tract infection 02/02/2023   Stress 02/02/2023   Insomnia 12/23/2022   Blood clotting disorder (HCC) 10/28/2022   Neck pain 10/27/2022   Acute thoracic back pain 10/27/2022   Lumbar radiculopathy 05/26/2022   Degeneration of lumbar intervertebral disc 05/26/2022   Major depressive disorder    Severe alcohol use disorder    Generalized anxiety disorder    Obstructive sleep apnea    Hepatic steatosis 04/22/2022   Osteoarthritis of carpometacarpal (CMC) joint of thumb 04/20/2022   Acquired trigger finger of right ring finger 04/20/2022    Bilateral wrist pain 04/20/2022   Chalazion of left lower eyelid 04/15/2022   LLQ pain 04/15/2022   Bipolar 1 disorder (HCC)    Cervical radiculopathy 10/14/2021   Pain in joint of right hip 07/03/2020   Rib pain 07/03/2020   Recurrent syncope    Orthostasis    Lactic acidosis 12/18/2019   Hematemesis 12/18/2019   Pain in right knee 12/14/2018   PAF (paroxysmal atrial fibrillation) 10/09/2018   Dorsalgia 09/22/2018   Dysphagia 09/22/2018   GERD without esophagitis 09/22/2018   Irritable bowel syndrome without diarrhea 09/22/2018   Osteoarthritis of knee 09/22/2018   Polypharmacy 09/22/2018   Sciatica of right side 09/22/2018   Vitamin D deficiency 09/22/2018   Mixed hyperlipidemia 05/07/2016   Chronic atrial fibrillation (HCC) 09/08/2015   Coronary artery disease involving native coronary artery with angina pectoris 03/24/2015   Essential hypertension 03/03/2015   Chest pain 02/11/2015   Non-ST elevation MI (NSTEMI) (HCC) 07/26/2008    PCP: Tamera Punt  REFERRING PROVIDER: Tamera Punt  REFERRING DIAG: acute thoracic back pain  THERAPY DIAG:  Chronic pain of both knees  Difficulty in walking, not elsewhere classified  Muscle weakness (generalized)  Rationale for Evaluation and Treatment: Rehabilitation  ONSET DATE: 11/2022  SUBJECTIVE:   SUBJECTIVE STATEMENT: Pt states she felt good after last workout. Her Lt knee feels like it will "give out" from time to time  PERTINENT HISTORY: Cervical DDD Coccyx fracture From eval: Pt states her knees have bothered her for years but that they have been worsening lately. She states that 2-3 weeks ago her Lt knee "went out" on her while she was standing in a line. She states she can also hear "crunching" in her knees. Pt also has sciatica down Rt LE due to a fall 3 years ago where she broke her coccyx.  She saw MD who recommended physical therapy. Knee pain increases with prolonged standing and walking, back pain increases with lifting.  Pain decreases with use of voltaren gel or meds  PAIN:  Are you having pain? Yes: NPRS scale: 1-2/10 currently, 7/10 after work/10 Pain location: Rt LE, bilat knees Pain description: burning, aching, sore Aggravating factors: prolonged standing and walking, lifting Relieving factors: meds  PRECAUTIONS: None  WEIGHT BEARING RESTRICTIONS: [name]  FALLS:  Has patient fallen in last 6 months? [name]  OCCUPATION: Conservation officer, nature at [employer], lots of walking, lifting and standing  PLOF: Independent  PATIENT GOALS: be able to walk for exercise, return fully to work with decreased pain  NEXT MD VISIT: July 2024  OBJECTIVE: (Measures in this section from initial evaluation unless otherwise noted)   MUSCLE LENGTH: Hamstrings: Right WFL deg; Left WFL deg  POSTURE: decreased lumbar lordosis  PALPATION: Pain with lateral patellar mobs Lt knee Crepitus with patellar mobs bilat Pain with palpation Rt glutes near sacrum TTP with UPAs Rt SIJ  Lumbar ROM:  flex WFL   Ext 75%   Rt lateral flexion 50% pain   Lt lateral flexion WFL   Rt rotation WFL pain end range   Lt rotation WFL pain end range  LOWER EXTREMITY ROM: WFL bilat  LOWER EXTREMITY MMT:  MMT Right eval Left eval Right 02/03/23 Left 02/03/23  Hip flexion 4- 4 SLR with ER 3/5 5 5   Hip extension 3+ 3+ 4+ 4+  Hip abduction 4- 4 4 4+  Hip adduction      Hip internal rotation      Hip external rotation 4 4 4+ 4+  Knee flexion 4 4 5  4+  Knee extension 4 4 5 5   Ankle dorsiflexion      Ankle plantarflexion      Ankle inversion      Ankle eversion       (Blank rows = not tested)  LOWER EXTREMITY SPECIAL TESTS:  SLR (-) bilat  GAIT: Distance walked: 100' Assistive device utilized: Single point cane Level of assistance: Modified independence Comments: antalgic gait, decreased cadence   TODAY'S TREATMENT:     [facility] Adult PT Treatment:                                                DATE: 02/15/23 Therapeutic  Exercise: Nustep L6 x 5 min for warm up Sit <> stand 20# KB 2 x 10 Modified dead lift 20# KB 2 x 10 Sled push/pull x 2 Step up 6'' 2 x 10 with 10# KB Lateral medball toss 3kg onto rebounder 2 x 10 bilat Hip abd/add on slider 2 x 10 bilat Chops 10# x 15 bilat Lifts 5# x 15 bilat Reactive isometric 20# x 15   [facility] Adult PT Treatment:                                                DATE: 02/08/23 Therapeutic Exercise: Recumbent L4 x 5 min Sit <> stand 15# KB 2 x 10 Modified dead lift 15# KB 2 x 10 Suitcase carry 15# KB Farmers carry 15# KB Step up 6'' 2 x 10 with 10# KB Row reactive iso at cables 15# 2x10 Reactive iso shoulder ext 10# x 15 Hip abd/add on slider 2 x 10 bilat Seated HS stretch 2 x 30 sec Seated piriformis stretch 2 x 30 sec Seated HS stretch 2 x 30 sec   [facility] Adult PT Treatment:                                                DATE: 02/03/23 Therapeutic Exercise: Recumbent bike L4 x 5 min Adductor stretch x 30 sec Sit <> stand 15# KB 2 x 10 Modified dead lift 15# KB 2 x 10 Row reactive iso at cables 15# 2x10 Shoulder ext reactive iso at cables 5# 2x10 Suitcase carry 10# x 2 laps Farmers carry 10# x 2 laps Staggered stance deadlift 10# x10 Therapeutic Activity: Rechecked goals   PATIENT EDUCATION:  Education details: PT POC and goals, HEP Person educated: Patient Education method: Explanation, Demonstration, and Handouts Education comprehension: verbalized understanding and  returned demonstration  HOME EXERCISE PROGRAM: Access Code: [email or web address] URL: [email or web address] Date: 01/25/2023 Prepared by: Reggy Eye  Exercises - Figure 4 Bridge  - 1 x daily - 7 x weekly - 2 sets - 10 reps - Straight Leg Raise with External Rotation  - 1 x daily - 7 x weekly - 2 sets - 10 reps - Hip Extension with Resistance Loop  - 1 x daily - 7 x weekly - 3 sets - 10 reps - Diagonal Hip Extension with Resistance  - 1 x daily - 7 x weekly - 3 sets - 10  reps - Supine Hamstring Stretch with Strap  - 1 x daily - 7 x weekly - 2 sets - 30 sec hold - Supine Quadriceps Stretch with Strap on Table  - 1 x daily - 7 x weekly - 2 sets - 30 sec hold - Supine Piriformis Stretch with Foot on Ground  - 1 x daily - 7 x weekly - 2 sets - 30 sec hold - Clamshell with Resistance  - 1 x daily - 7 x weekly - 2 sets - 10 reps - Seated Piriformis Stretch with Trunk Bend  - 1 x daily - 7 x weekly - 2 sets - 30 sec hold - Seated Hamstring Stretch  - 1 x daily - 7 x weekly - 2 sets - 30 sec hold - Dead Bug  - 1 x daily - 7 x weekly - 2 sets - 10 reps - Side Stepping with Resistance at Ankles and Counter Support  - 1 x daily - 7 x weekly - 2 sets - 10 reps - Hip Extension with Single Leg Support Prone on Table Edge  - 1 x daily - 7 x weekly - 3 sets - 10 reps - Half Deadlift with Kettlebell  - 1 x daily - 7 x weekly - 3 sets - 10 reps - Sit to Stand  - 1 x daily - 7 x weekly - 3 sets - 10 reps  ASSESSMENT:  CLINICAL IMPRESSION: Continued to functional and work simulation movements with pt with improving tolerance to all activity. She is increasing hours at work in the next week so we will assess her response to more time on her feet  OBJECTIVE IMPAIRMENTS: decreased activity tolerance, difficulty walking, decreased strength, increased muscle spasms, and pain.     GOALS: Goals reviewed with patient? Yes  SHORT TERM GOALS: Target date: 01/12/2023   Pt will be independent with initial HEP Baseline: Goal status: MET   LONG TERM GOALS: Target date: 03/22/2023     Pt will be independent with advanced HEP Baseline:  Goal status: IN PROGRESS  2.  Pt will improve bilat LE strength to 4+/5 to improve standing and walking tolerance Baseline:  02/03/23: See MMT chart above Goal status: IN PROGRESS  3.  Pt will tolerate lifting 25# floor to chest with good mechanics and pain <= 2/10 Baseline:  02/03/23: 15# sit to stand and deadlifts Goal status: IN  PROGRESS  4.  Pt will tolerate working full shift with knee pain <= 2/10 Baseline:  02/03/23: Has not been assigned more than 5 hours of work still Goal status: IN PROGRESS    PLAN:  PT FREQUENCY: 2x/week  PT DURATION: 6 weeks  PLANNED INTERVENTIONS: Therapeutic exercises, Therapeutic activity, Neuromuscular re-education, Balance training, Gait training, Patient/Family education, Self Care, Joint mobilization, Aquatic Therapy, Dry Needling, Electrical stimulation, Cryotherapy, Moist heat, Taping, Vasopneumatic device, Traction, Ultrasound, Ionotophoresis 4mg /ml Dexamethasone, Manual therapy, and Re-evaluation  PLAN FOR NEXT SESSION: standing LE and core strength, lifting mechanics   Elantra Caprara, PT 02/15/2023, 1:56 PM

## 2023-02-17 ENCOUNTER — Ambulatory Visit: Payer: Medicare Other | Admitting: Physical Therapy

## 2023-02-17 ENCOUNTER — Encounter: Payer: Self-pay | Admitting: Physical Therapy

## 2023-02-17 DIAGNOSIS — M25561 Pain in right knee: Secondary | ICD-10-CM | POA: Diagnosis not present

## 2023-02-17 DIAGNOSIS — M6281 Muscle weakness (generalized): Secondary | ICD-10-CM

## 2023-02-17 DIAGNOSIS — G8929 Other chronic pain: Secondary | ICD-10-CM

## 2023-02-17 DIAGNOSIS — R262 Difficulty in walking, not elsewhere classified: Secondary | ICD-10-CM | POA: Diagnosis not present

## 2023-02-17 DIAGNOSIS — M25562 Pain in left knee: Secondary | ICD-10-CM | POA: Diagnosis not present

## 2023-02-17 NOTE — Therapy (Signed)
OUTPATIENT PHYSICAL THERAPY LOWER EXTREMITY TREATMENT    Patient Name: Lindsay [name] MRN: [id number] DOB:04-06-1956, 67 y.o., female Today's Date: 02/17/2023 DATES OF SERVICE: 12/29/22-02/15/23 END OF SESSION:  PT End of Session - 02/17/23 0806     Visit Number 11    Number of Visits 24    Date for PT Re-Evaluation 03/22/23    Authorization Type Medicare    Authorization - Visit Number 11    Progress Note Due on Visit 20    PT Start Time 0806    PT Stop Time 0845    PT Time Calculation (min) 39 min    Activity Tolerance Patient tolerated treatment well    Behavior During Therapy [facility] for tasks assessed/performed                    Past Medical History:  Diagnosis Date   Abnormal mammogram    Thick Tissue   Acquired trigger finger of right ring finger 04/20/2022   Arthritis    Bilateral wrist pain 04/20/2022   Bipolar 1 disorder    Cataract    OU   Cervical radiculopathy 10/14/2021   Chalazion of left lower eyelid 04/15/2022   Chest pain 02/11/2015   Chronic atrial fibrillation 09/08/2015   Colon polyp    Compressed cervical disc    Coronary artery disease involving native coronary artery with angina pectoris 03/24/2015   Dorsalgia 09/22/2018   Dysphagia 09/22/2018   Essential hypertension 03/03/2015   Generalized anxiety disorder    GERD without esophagitis 09/22/2018   Hematemesis 12/18/2019   Hepatic steatosis 04/22/2022   History of appendectomy 1963   Hypertensive retinopathy    OU   Irritable bowel syndrome without diarrhea 09/22/2018   Lactic acidosis 12/18/2019   LLQ pain 04/15/2022   Major depressive disorder    Migraine without status migrainosus, not intractable 09/22/2018   Mixed hyperlipidemia 05/07/2016   Non-ST elevation MI (NSTEMI) 07/26/2008   No cardiac catheterization at the time.   Obstructive sleep apnea    no CPAP use   Orthostasis    Osteoarthritis of carpometacarpal (CMC) joint of thumb 04/20/2022   Osteoarthritis of knee  09/22/2018   PAF (paroxysmal atrial fibrillation) 10/09/2018   Pain in joint of right hip 07/03/2020   Pain in right knee 12/14/2018   Recurrent syncope    Rib pain 07/03/2020   Sciatica of right side 09/22/2018   Severe alcohol use disorder    Vitamin D deficiency    Past Surgical History:  Procedure Laterality Date   APPENDECTOMY     CARDIAC CATHETERIZATION N/A 03/25/2015   Procedure: Left Heart Cath and Coronary Angiography;  Surgeon: Marykay Lex, MD;  Location: [facility] INVASIVE CV LAB;  Service: Cardiovascular;  Laterality: N/A;   CORONARY STENT INTERVENTION N/A 10/09/2018   Procedure: CORONARY STENT INTERVENTION;  Surgeon: Corky Crafts, MD;  Location: [facility] INVASIVE CV LAB;  Service: Cardiovascular;  Laterality: N/A;   DILATATION & CURETTAGE/HYSTEROSCOPY WITH MYOSURE N/A 05/19/2016   Procedure: DILATATION & CURETTAGE/HYSTEROSCOPY WITH MYOSURE;  Surgeon: Geryl Rankins, MD;  Location: [facility];  Service: Gynecology;  Laterality: N/A;  Possible Myosure for polyps.   EYE SURGERY     LEFT HEART CATH AND CORONARY ANGIOGRAPHY N/A 10/09/2018   Procedure: LEFT HEART CATH AND CORONARY ANGIOGRAPHY;  Surgeon: Corky Crafts, MD;  Location: [facility] INVASIVE CV LAB;  Service: Cardiovascular;  Laterality: N/A;   MOUTH SURGERY     URETHRAL DILATION     Patient Active Problem  List   Diagnosis Date Noted   Viral upper respiratory tract infection 02/02/2023   Stress 02/02/2023   Insomnia 12/23/2022   Blood clotting disorder (HCC) 10/28/2022   Neck pain 10/27/2022   Acute thoracic back pain 10/27/2022   Lumbar radiculopathy 05/26/2022   Degeneration of lumbar intervertebral disc 05/26/2022   Major depressive disorder    Severe alcohol use disorder    Generalized anxiety disorder    Obstructive sleep apnea    Hepatic steatosis 04/22/2022   Osteoarthritis of carpometacarpal (CMC) joint of thumb 04/20/2022   Acquired trigger finger of right ring finger 04/20/2022   Bilateral wrist pain  04/20/2022   Chalazion of left lower eyelid 04/15/2022   LLQ pain 04/15/2022   Bipolar 1 disorder (HCC)    Cervical radiculopathy 10/14/2021   Pain in joint of right hip 07/03/2020   Rib pain 07/03/2020   Recurrent syncope    Orthostasis    Lactic acidosis 12/18/2019   Hematemesis 12/18/2019   Pain in right knee 12/14/2018   PAF (paroxysmal atrial fibrillation) 10/09/2018   Dorsalgia 09/22/2018   Dysphagia 09/22/2018   GERD without esophagitis 09/22/2018   Irritable bowel syndrome without diarrhea 09/22/2018   Osteoarthritis of knee 09/22/2018   Polypharmacy 09/22/2018   Sciatica of right side 09/22/2018   Vitamin D deficiency 09/22/2018   Mixed hyperlipidemia 05/07/2016   Chronic atrial fibrillation (HCC) 09/08/2015   Coronary artery disease involving native coronary artery with angina pectoris 03/24/2015   Essential hypertension 03/03/2015   Chest pain 02/11/2015   Non-ST elevation MI (NSTEMI) (HCC) 07/26/2008    PCP: Tamera Punt  REFERRING PROVIDER: Tamera Punt  REFERRING DIAG: acute thoracic back pain  THERAPY DIAG:  Chronic pain of both knees  Difficulty in walking, not elsewhere classified  Muscle weakness (generalized)  Rationale for Evaluation and Treatment: Rehabilitation  ONSET DATE: 11/2022  SUBJECTIVE:   SUBJECTIVE STATEMENT: Pt reports an electrician is coming to her house today -- her alarm didn't go off. Pt states she has been on her knees hand cleaning her floors.  PERTINENT HISTORY: Cervical DDD Coccyx fracture From eval: Pt states her knees have bothered her for years but that they have been worsening lately. She states that 2-3 weeks ago her Lt knee "went out" on her while she was standing in a line. She states she can also hear "crunching" in her knees. Pt also has sciatica down Rt LE due to a fall 3 years ago where she broke her coccyx.  She saw MD who recommended physical therapy. Knee pain increases with prolonged standing and walking, back pain  increases with lifting. Pain decreases with use of voltaren gel or meds  PAIN:  Are you having pain? Yes: NPRS scale: 0/10 currently, 7/10 after work/10 Pain location: Rt LE, bilat knees Pain description: burning, aching, sore Aggravating factors: prolonged standing and walking, lifting Relieving factors: meds  PRECAUTIONS: None  WEIGHT BEARING RESTRICTIONS: No  FALLS:  Has patient fallen in last 6 months? No  OCCUPATION: Conservation officer, nature at [employer], lots of walking, lifting and standing  PLOF: Independent  PATIENT GOALS: be able to walk for exercise, return fully to work with decreased pain  NEXT MD VISIT: July 2024  OBJECTIVE: (Measures in this section from initial evaluation unless otherwise noted)  MUSCLE LENGTH: Hamstrings: Right WFL deg; Left WFL deg  POSTURE: decreased lumbar lordosis  PALPATION: Pain with lateral patellar mobs Lt knee Crepitus with patellar mobs bilat Pain with palpation Rt glutes near sacrum TTP with UPAs Rt  SIJ  Lumbar ROM: flex WFL   Ext 75%   Rt lateral flexion 50% pain   Lt lateral flexion WFL   Rt rotation WFL pain end range   Lt rotation WFL pain end range  LOWER EXTREMITY ROM: WFL bilat  LOWER EXTREMITY MMT:  MMT Right eval Left eval Right 02/03/23 Left 02/03/23  Hip flexion 4- 4 SLR with ER 3/5 5 5   Hip extension 3+ 3+ 4+ 4+  Hip abduction 4- 4 4 4+  Hip adduction      Hip internal rotation      Hip external rotation 4 4 4+ 4+  Knee flexion 4 4 5  4+  Knee extension 4 4 5 5   Ankle dorsiflexion      Ankle plantarflexion      Ankle inversion      Ankle eversion       (Blank rows = not tested)  LOWER EXTREMITY SPECIAL TESTS:  SLR (-) bilat  GAIT: Distance walked: 100' Assistive device utilized: Single point cane Level of assistance: Modified independence Comments: antalgic gait, decreased cadence   [facility] Adult PT Treatment:                                                DATE: 02/17/23 Therapeutic Exercise: Nustep  L6 x 5 min for warm up Sit <> stand 20# cables x 10, 25# x10 Dead lift 15# cables 2 x 10 Xband walk red TB 2x16' Farmer's carry 2 10# KB x 2 laps around gym Sled push/pull x20' no weight, 2x20' 50# Leg press 90# double leg 3x8    [facility] Adult PT Treatment:                                                DATE: 02/15/23 Therapeutic Exercise: Nustep L6 x 5 min for warm up Sit <> stand 20# KB 2 x 10 Modified dead lift 20# KB 2 x 10 Sled push/pull x 2 Step up 6'' 2 x 10 with 10# KB Lateral medball toss 3kg onto rebounder 2 x 10 bilat Hip abd/add on slider 2 x 10 bilat Chops 10# x 15 bilat Lifts 5# x 15 bilat Reactive isometric 20# x 15   [facility] Adult PT Treatment:                                                DATE: 02/08/23 Therapeutic Exercise: Recumbent L4 x 5 min Sit <> stand 15# KB 2 x 10 Modified dead lift 15# KB 2 x 10 Suitcase carry 15# KB Farmers carry 15# KB Step up 6'' 2 x 10 with 10# KB Row reactive iso at cables 15# 2x10 Reactive iso shoulder ext 10# x 15 Hip abd/add on slider 2 x 10 bilat Seated HS stretch 2 x 30 sec Seated piriformis stretch 2 x 30 sec Seated HS stretch 2 x 30 sec    PATIENT EDUCATION:  Education details: PT POC and goals, HEP Person educated: Patient Education method: Explanation, Demonstration, and Handouts Education comprehension: verbalized understanding and returned demonstration  HOME EXERCISE PROGRAM: Access Code: [email or web address] URL: [email or web address]  Date: 01/25/2023 Prepared by: Reggy Eye  Exercises - Figure 4 Bridge  - 1 x daily - 7 x weekly - 2 sets - 10 reps - Straight Leg Raise with External Rotation  - 1 x daily - 7 x weekly - 2 sets - 10 reps - Hip Extension with Resistance Loop  - 1 x daily - 7 x weekly - 3 sets - 10 reps - Diagonal Hip Extension with Resistance  - 1 x daily - 7 x weekly - 3 sets - 10 reps - Supine Hamstring Stretch with Strap  - 1 x daily - 7 x weekly - 2 sets - 30 sec hold - Supine  Quadriceps Stretch with Strap on Table  - 1 x daily - 7 x weekly - 2 sets - 30 sec hold - Supine Piriformis Stretch with Foot on Ground  - 1 x daily - 7 x weekly - 2 sets - 30 sec hold - Clamshell with Resistance  - 1 x daily - 7 x weekly - 2 sets - 10 reps - Seated Piriformis Stretch with Trunk Bend  - 1 x daily - 7 x weekly - 2 sets - 30 sec hold - Seated Hamstring Stretch  - 1 x daily - 7 x weekly - 2 sets - 30 sec hold - Dead Bug  - 1 x daily - 7 x weekly - 2 sets - 10 reps - Side Stepping with Resistance at Ankles and Counter Support  - 1 x daily - 7 x weekly - 2 sets - 10 reps - Hip Extension with Single Leg Support Prone on Table Edge  - 1 x daily - 7 x weekly - 3 sets - 10 reps - Half Deadlift with Kettlebell  - 1 x daily - 7 x weekly - 3 sets - 10 reps - Sit to Stand  - 1 x daily - 7 x weekly - 3 sets - 10 reps  ASSESSMENT:  CLINICAL IMPRESSION: Continued to work on progressive strengthening. Pt is tolerating increased weights and repetitions demonstrating good strength and endurance gains. Has continued to increase home activities with less pain.   OBJECTIVE IMPAIRMENTS: decreased activity tolerance, difficulty walking, decreased strength, increased muscle spasms, and pain.     GOALS: Goals reviewed with patient? Yes  SHORT TERM GOALS: Target date: 01/12/2023   Pt will be independent with initial HEP Baseline: Goal status: MET   LONG TERM GOALS: Target date: 03/22/2023  Pt will be independent with advanced HEP Baseline:  Goal status: IN PROGRESS  2.  Pt will improve bilat LE strength to 4+/5 to improve standing and walking tolerance Baseline:  02/03/23: See MMT chart above Goal status: IN PROGRESS  3.  Pt will tolerate lifting 25# floor to chest with good mechanics and pain <= 2/10 Baseline:  02/03/23: 15# sit to stand and deadlifts Goal status: IN PROGRESS  4.  Pt will tolerate working full shift with knee pain <= 2/10 Baseline:  02/03/23: Has not been assigned  more than 5 hours of work still Goal status: IN PROGRESS    PLAN:  PT FREQUENCY: 2x/week  PT DURATION: 6 weeks  PLANNED INTERVENTIONS: Therapeutic exercises, Therapeutic activity, Neuromuscular re-education, Balance training, Gait training, Patient/Family education, Self Care, Joint mobilization, Aquatic Therapy, Dry Needling, Electrical stimulation, Cryotherapy, Moist heat, Taping, Vasopneumatic device, Traction, Ultrasound, Ionotophoresis 4mg /ml Dexamethasone, Manual therapy, and Re-evaluation  PLAN FOR NEXT SESSION: standing LE and core strength, lifting mechanics   Wateen Varon April Ma L Ebba Goll, PT 02/17/2023, 8:06  AM

## 2023-02-22 ENCOUNTER — Encounter: Payer: Self-pay | Admitting: Physical Therapy

## 2023-02-22 ENCOUNTER — Ambulatory Visit: Payer: Medicare Other | Admitting: Physical Therapy

## 2023-02-22 DIAGNOSIS — M5136 Other intervertebral disc degeneration, lumbar region: Secondary | ICD-10-CM | POA: Diagnosis not present

## 2023-02-22 DIAGNOSIS — M6281 Muscle weakness (generalized): Secondary | ICD-10-CM | POA: Diagnosis not present

## 2023-02-22 DIAGNOSIS — M5412 Radiculopathy, cervical region: Secondary | ICD-10-CM | POA: Diagnosis not present

## 2023-02-22 DIAGNOSIS — M25561 Pain in right knee: Secondary | ICD-10-CM | POA: Diagnosis not present

## 2023-02-22 DIAGNOSIS — M545 Low back pain, unspecified: Secondary | ICD-10-CM | POA: Diagnosis not present

## 2023-02-22 DIAGNOSIS — G8929 Other chronic pain: Secondary | ICD-10-CM

## 2023-02-22 DIAGNOSIS — M25562 Pain in left knee: Secondary | ICD-10-CM | POA: Diagnosis not present

## 2023-02-22 DIAGNOSIS — M5416 Radiculopathy, lumbar region: Secondary | ICD-10-CM | POA: Diagnosis not present

## 2023-02-22 DIAGNOSIS — R262 Difficulty in walking, not elsewhere classified: Secondary | ICD-10-CM

## 2023-02-22 DIAGNOSIS — M47812 Spondylosis without myelopathy or radiculopathy, cervical region: Secondary | ICD-10-CM | POA: Diagnosis not present

## 2023-02-22 NOTE — Therapy (Signed)
OUTPATIENT PHYSICAL THERAPY LOWER EXTREMITY TREATMENT    Patient Name: Lindsay [name] MRN: [id number] DOB:Mar 17, 1956, 67 y.o., female Today's Date: 02/22/2023 DATES OF SERVICE: 12/29/22-02/15/23 END OF SESSION:  PT End of Session - 02/22/23 1313     Visit Number 12    Number of Visits 24    Date for PT Re-Evaluation 03/22/23    Authorization Type Medicare    Progress Note Due on Visit 20    PT Start Time 1315    PT Stop Time 1355    PT Time Calculation (min) 40 min    Activity Tolerance Patient tolerated treatment well    Behavior During Therapy [facility] for tasks assessed/performed                    Past Medical History:  Diagnosis Date   Abnormal mammogram    Thick Tissue   Acquired trigger finger of right ring finger 04/20/2022   Arthritis    Bilateral wrist pain 04/20/2022   Bipolar 1 disorder    Cataract    OU   Cervical radiculopathy 10/14/2021   Chalazion of left lower eyelid 04/15/2022   Chest pain 02/11/2015   Chronic atrial fibrillation 09/08/2015   Colon polyp    Compressed cervical disc    Coronary artery disease involving native coronary artery with angina pectoris 03/24/2015   Dorsalgia 09/22/2018   Dysphagia 09/22/2018   Essential hypertension 03/03/2015   Generalized anxiety disorder    GERD without esophagitis 09/22/2018   Hematemesis 12/18/2019   Hepatic steatosis 04/22/2022   History of appendectomy 1963   Hypertensive retinopathy    OU   Irritable bowel syndrome without diarrhea 09/22/2018   Lactic acidosis 12/18/2019   LLQ pain 04/15/2022   Major depressive disorder    Migraine without status migrainosus, not intractable 09/22/2018   Mixed hyperlipidemia 05/07/2016   Non-ST elevation MI (NSTEMI) 07/26/2008   No cardiac catheterization at the time.   Obstructive sleep apnea    no CPAP use   Orthostasis    Osteoarthritis of carpometacarpal (CMC) joint of thumb 04/20/2022   Osteoarthritis of knee 09/22/2018   PAF (paroxysmal atrial  fibrillation) 10/09/2018   Pain in joint of right hip 07/03/2020   Pain in right knee 12/14/2018   Recurrent syncope    Rib pain 07/03/2020   Sciatica of right side 09/22/2018   Severe alcohol use disorder    Vitamin D deficiency    Past Surgical History:  Procedure Laterality Date   APPENDECTOMY     CARDIAC CATHETERIZATION N/A 03/25/2015   Procedure: Left Heart Cath and Coronary Angiography;  Surgeon: Marykay Lex, MD;  Location: [facility] INVASIVE CV LAB;  Service: Cardiovascular;  Laterality: N/A;   CORONARY STENT INTERVENTION N/A 10/09/2018   Procedure: CORONARY STENT INTERVENTION;  Surgeon: Corky Crafts, MD;  Location: [facility] INVASIVE CV LAB;  Service: Cardiovascular;  Laterality: N/A;   DILATATION & CURETTAGE/HYSTEROSCOPY WITH MYOSURE N/A 05/19/2016   Procedure: DILATATION & CURETTAGE/HYSTEROSCOPY WITH MYOSURE;  Surgeon: Geryl Rankins, MD;  Location: [facility];  Service: Gynecology;  Laterality: N/A;  Possible Myosure for polyps.   EYE SURGERY     LEFT HEART CATH AND CORONARY ANGIOGRAPHY N/A 10/09/2018   Procedure: LEFT HEART CATH AND CORONARY ANGIOGRAPHY;  Surgeon: Corky Crafts, MD;  Location: [facility] INVASIVE CV LAB;  Service: Cardiovascular;  Laterality: N/A;   MOUTH SURGERY     URETHRAL DILATION     Patient Active Problem List   Diagnosis Date Noted  Viral upper respiratory tract infection 02/02/2023   Stress 02/02/2023   Insomnia 12/23/2022   Blood clotting disorder (HCC) 10/28/2022   Neck pain 10/27/2022   Acute thoracic back pain 10/27/2022   Lumbar radiculopathy 05/26/2022   Degeneration of lumbar intervertebral disc 05/26/2022   Major depressive disorder    Severe alcohol use disorder    Generalized anxiety disorder    Obstructive sleep apnea    Hepatic steatosis 04/22/2022   Osteoarthritis of carpometacarpal (CMC) joint of thumb 04/20/2022   Acquired trigger finger of right ring finger 04/20/2022   Bilateral wrist pain 04/20/2022   Chalazion of left lower  eyelid 04/15/2022   LLQ pain 04/15/2022   Bipolar 1 disorder (HCC)    Cervical radiculopathy 10/14/2021   Pain in joint of right hip 07/03/2020   Rib pain 07/03/2020   Recurrent syncope    Orthostasis    Lactic acidosis 12/18/2019   Hematemesis 12/18/2019   Pain in right knee 12/14/2018   PAF (paroxysmal atrial fibrillation) 10/09/2018   Dorsalgia 09/22/2018   Dysphagia 09/22/2018   GERD without esophagitis 09/22/2018   Irritable bowel syndrome without diarrhea 09/22/2018   Osteoarthritis of knee 09/22/2018   Polypharmacy 09/22/2018   Sciatica of right side 09/22/2018   Vitamin D deficiency 09/22/2018   Mixed hyperlipidemia 05/07/2016   Chronic atrial fibrillation (HCC) 09/08/2015   Coronary artery disease involving native coronary artery with angina pectoris 03/24/2015   Essential hypertension 03/03/2015   Chest pain 02/11/2015   Non-ST elevation MI (NSTEMI) (HCC) 07/26/2008    PCP: Tamera Punt  REFERRING PROVIDER: Tamera Punt  REFERRING DIAG: acute thoracic back pain  THERAPY DIAG:  Chronic pain of both knees  Difficulty in walking, not elsewhere classified  Muscle weakness (generalized)  Rationale for Evaluation and Treatment: Rehabilitation  ONSET DATE: 11/2022  SUBJECTIVE:   SUBJECTIVE STATEMENT: Pt states she felt pretty good after last session. Pt reports Sunday was tough at work. Pt states she is just tired today. Didn't sleep well. Has noticed increase in varicose veins and edema.   PERTINENT HISTORY: Cervical DDD Coccyx fracture From eval: Pt states her knees have bothered her for years but that they have been worsening lately. She states that 2-3 weeks ago her Lt knee "went out" on her while she was standing in a line. She states she can also hear "crunching" in her knees. Pt also has sciatica down Rt LE due to a fall 3 years ago where she broke her coccyx.  She saw MD who recommended physical therapy. Knee pain increases with prolonged standing and walking, back  pain increases with lifting. Pain decreases with use of voltaren gel or meds  PAIN:  Are you having pain? Yes: NPRS scale: 0/10 currently, 7/10 after work/10 Pain location: Rt LE, bilat knees Pain description: burning, aching, sore Aggravating factors: prolonged standing and walking, lifting Relieving factors: meds  PRECAUTIONS: None  WEIGHT BEARING RESTRICTIONS: No  FALLS:  Has patient fallen in last 6 months? No  OCCUPATION: Conservation officer, nature at [employer], lots of walking, lifting and standing  PLOF: Independent  PATIENT GOALS: be able to walk for exercise, return fully to work with decreased pain  NEXT MD VISIT: July 2024  OBJECTIVE: (Measures in this section from initial evaluation unless otherwise noted)  MUSCLE LENGTH: Hamstrings: Right WFL deg; Left WFL deg  POSTURE: decreased lumbar lordosis  PALPATION: Pain with lateral patellar mobs Lt knee Crepitus with patellar mobs bilat Pain with palpation Rt glutes near sacrum TTP with UPAs Rt SIJ  Lumbar ROM: flex WFL   Ext 75%   Rt lateral flexion 50% pain   Lt lateral flexion WFL   Rt rotation WFL pain end range   Lt rotation WFL pain end range  LOWER EXTREMITY ROM: WFL bilat  LOWER EXTREMITY MMT:  MMT Right eval Left eval Right 02/03/23 Left 02/03/23  Hip flexion 4- 4 SLR with ER 3/5 5 5   Hip extension 3+ 3+ 4+ 4+  Hip abduction 4- 4 4 4+  Hip adduction      Hip internal rotation      Hip external rotation 4 4 4+ 4+  Knee flexion 4 4 5  4+  Knee extension 4 4 5 5   Ankle dorsiflexion      Ankle plantarflexion      Ankle inversion      Ankle eversion       (Blank rows = not tested)  LOWER EXTREMITY SPECIAL TESTS:  SLR (-) bilat  GAIT: Distance walked: 100' Assistive device utilized: Single point cane Level of assistance: Modified independence Comments: antalgic gait, decreased cadence  [facility] Adult PT Treatment:                                                DATE: 02/22/23 Therapeutic  Exercise: Nustep L6 x 5 min for warm up Leg press 65# double leg x10, 90# 3x8 Heel raise on 4" step 3x10 Xband walk green TB 2x16' Forwards/Backwards walking green TB 2x16' Sled push/pull 3x20' 20# (attempted 50# but could feel too much in knee) Chops 5# cables x10 Lawnmower 5# cables x10   [facility] Adult PT Treatment:                                                DATE: 02/17/23 Therapeutic Exercise: Nustep L6 x 5 min for warm up Sit <> stand 20# cables x 10, 25# x10 Dead lift 15# cables 2 x 10 Xband walk red TB 2x16' Farmer's carry 2 10# KB x 2 laps around gym Sled push/pull x20' no weight, 2x20' 50# Leg press 90# double leg 3x8    [facility] Adult PT Treatment:                                                DATE: 02/15/23 Therapeutic Exercise: Nustep L6 x 5 min for warm up Sit <> stand 20# KB 2 x 10 Modified dead lift 20# KB 2 x 10 Sled push/pull x 2 Step up 6'' 2 x 10 with 10# KB Lateral medball toss 3kg onto rebounder 2 x 10 bilat Hip abd/add on slider 2 x 10 bilat Chops 10# x 15 bilat Lifts 5# x 15 bilat Reactive isometric 20# x 15   PATIENT EDUCATION:  Education details: PT POC and goals, HEP Person educated: Patient Education method: Explanation, Demonstration, and Handouts Education comprehension: verbalized understanding and returned demonstration  HOME EXERCISE PROGRAM: Access Code: [email or web address] URL: [email or web address] Date: 01/25/2023 Prepared by: Reggy Eye  Exercises - Figure 4 Bridge  - 1 x daily - 7 x weekly - 2 sets - 10 reps - Straight Leg Raise with  External Rotation  - 1 x daily - 7 x weekly - 2 sets - 10 reps - Hip Extension with Resistance Loop  - 1 x daily - 7 x weekly - 3 sets - 10 reps - Diagonal Hip Extension with Resistance  - 1 x daily - 7 x weekly - 3 sets - 10 reps - Supine Hamstring Stretch with Strap  - 1 x daily - 7 x weekly - 2 sets - 30 sec hold - Supine Quadriceps Stretch with Strap on Table  - 1 x daily - 7 x weekly - 2  sets - 30 sec hold - Supine Piriformis Stretch with Foot on Ground  - 1 x daily - 7 x weekly - 2 sets - 30 sec hold - Clamshell with Resistance  - 1 x daily - 7 x weekly - 2 sets - 10 reps - Seated Piriformis Stretch with Trunk Bend  - 1 x daily - 7 x weekly - 2 sets - 30 sec hold - Seated Hamstring Stretch  - 1 x daily - 7 x weekly - 2 sets - 30 sec hold - Dead Bug  - 1 x daily - 7 x weekly - 2 sets - 10 reps - Side Stepping with Resistance at Ankles and Counter Support  - 1 x daily - 7 x weekly - 2 sets - 10 reps - Hip Extension with Single Leg Support Prone on Table Edge  - 1 x daily - 7 x weekly - 3 sets - 10 reps - Half Deadlift with Kettlebell  - 1 x daily - 7 x weekly - 3 sets - 10 reps - Sit to Stand  - 1 x daily - 7 x weekly - 3 sets - 10 reps  ASSESSMENT:  CLINICAL IMPRESSION: Continued to work on progressive strengthening.   OBJECTIVE IMPAIRMENTS: decreased activity tolerance, difficulty walking, decreased strength, increased muscle spasms, and pain.     GOALS: Goals reviewed with patient? Yes  SHORT TERM GOALS: Target date: 01/12/2023   Pt will be independent with initial HEP Baseline: Goal status: MET   LONG TERM GOALS: Target date: 03/22/2023  Pt will be independent with advanced HEP Baseline:  Goal status: IN PROGRESS  2.  Pt will improve bilat LE strength to 4+/5 to improve standing and walking tolerance Baseline:  02/03/23: See MMT chart above Goal status: IN PROGRESS  3.  Pt will tolerate lifting 25# floor to chest with good mechanics and pain <= 2/10 Baseline:  02/03/23: 15# sit to stand and deadlifts Goal status: IN PROGRESS  4.  Pt will tolerate working full shift with knee pain <= 2/10 Baseline:  02/03/23: Has not been assigned more than 5 hours of work still Goal status: IN PROGRESS    PLAN:  PT FREQUENCY: 2x/week  PT DURATION: 6 weeks  PLANNED INTERVENTIONS: Therapeutic exercises, Therapeutic activity, Neuromuscular re-education, Balance  training, Gait training, Patient/Family education, Self Care, Joint mobilization, Aquatic Therapy, Dry Needling, Electrical stimulation, Cryotherapy, Moist heat, Taping, Vasopneumatic device, Traction, Ultrasound, Ionotophoresis 4mg /ml Dexamethasone, Manual therapy, and Re-evaluation  PLAN FOR NEXT SESSION: standing LE and core strength, lifting mechanics   Jannely Henthorn April Ma L Snyder Colavito, PT 02/22/2023, 1:14 PM

## 2023-02-24 ENCOUNTER — Ambulatory Visit: Payer: Medicare Other | Attending: Family Medicine | Admitting: Rehabilitative and Restorative Service Providers"

## 2023-02-24 ENCOUNTER — Encounter: Payer: Self-pay | Admitting: Rehabilitative and Restorative Service Providers"

## 2023-02-24 DIAGNOSIS — M25561 Pain in right knee: Secondary | ICD-10-CM | POA: Diagnosis not present

## 2023-02-24 DIAGNOSIS — M6281 Muscle weakness (generalized): Secondary | ICD-10-CM | POA: Insufficient documentation

## 2023-02-24 DIAGNOSIS — M25562 Pain in left knee: Secondary | ICD-10-CM | POA: Diagnosis not present

## 2023-02-24 DIAGNOSIS — R262 Difficulty in walking, not elsewhere classified: Secondary | ICD-10-CM | POA: Diagnosis not present

## 2023-02-24 DIAGNOSIS — G8929 Other chronic pain: Secondary | ICD-10-CM | POA: Insufficient documentation

## 2023-02-24 NOTE — Therapy (Signed)
OUTPATIENT PHYSICAL THERAPY LOWER EXTREMITY TREATMENT    Patient Name: Lindsay [name] MRN: [id number] DOB:10-09-1955, 67 y.o., female Today's Date: 02/24/2023 DATES OF SERVICE: 12/29/22-02/15/23 END OF SESSION:  PT End of Session - 02/24/23 1320     Visit Number 13    Number of Visits 24    Date for PT Re-Evaluation 03/22/23    Authorization Type Medicare    Authorization - Visit Number 12    Progress Note Due on Visit 20    PT Start Time 1319    PT Stop Time 1400    PT Time Calculation (min) 41 min    Activity Tolerance Patient tolerated treatment well                    Past Medical History:  Diagnosis Date   Abnormal mammogram    Thick Tissue   Acquired trigger finger of right ring finger 04/20/2022   Arthritis    Bilateral wrist pain 04/20/2022   Bipolar 1 disorder    Cataract    OU   Cervical radiculopathy 10/14/2021   Chalazion of left lower eyelid 04/15/2022   Chest pain 02/11/2015   Chronic atrial fibrillation 09/08/2015   Colon polyp    Compressed cervical disc    Coronary artery disease involving native coronary artery with angina pectoris 03/24/2015   Dorsalgia 09/22/2018   Dysphagia 09/22/2018   Essential hypertension 03/03/2015   Generalized anxiety disorder    GERD without esophagitis 09/22/2018   Hematemesis 12/18/2019   Hepatic steatosis 04/22/2022   History of appendectomy 1963   Hypertensive retinopathy    OU   Irritable bowel syndrome without diarrhea 09/22/2018   Lactic acidosis 12/18/2019   LLQ pain 04/15/2022   Major depressive disorder    Migraine without status migrainosus, not intractable 09/22/2018   Mixed hyperlipidemia 05/07/2016   Non-ST elevation MI (NSTEMI) 07/26/2008   No cardiac catheterization at the time.   Obstructive sleep apnea    no CPAP use   Orthostasis    Osteoarthritis of carpometacarpal (CMC) joint of thumb 04/20/2022   Osteoarthritis of knee 09/22/2018   PAF (paroxysmal atrial fibrillation) 10/09/2018    Pain in joint of right hip 07/03/2020   Pain in right knee 12/14/2018   Recurrent syncope    Rib pain 07/03/2020   Sciatica of right side 09/22/2018   Severe alcohol use disorder    Vitamin D deficiency    Past Surgical History:  Procedure Laterality Date   APPENDECTOMY     CARDIAC CATHETERIZATION N/A 03/25/2015   Procedure: Left Heart Cath and Coronary Angiography;  Surgeon: Marykay Lex, MD;  Location: [facility] INVASIVE CV LAB;  Service: Cardiovascular;  Laterality: N/A;   CORONARY STENT INTERVENTION N/A 10/09/2018   Procedure: CORONARY STENT INTERVENTION;  Surgeon: Corky Crafts, MD;  Location: [facility] INVASIVE CV LAB;  Service: Cardiovascular;  Laterality: N/A;   DILATATION & CURETTAGE/HYSTEROSCOPY WITH MYOSURE N/A 05/19/2016   Procedure: DILATATION & CURETTAGE/HYSTEROSCOPY WITH MYOSURE;  Surgeon: Geryl Rankins, MD;  Location: [facility];  Service: Gynecology;  Laterality: N/A;  Possible Myosure for polyps.   EYE SURGERY     LEFT HEART CATH AND CORONARY ANGIOGRAPHY N/A 10/09/2018   Procedure: LEFT HEART CATH AND CORONARY ANGIOGRAPHY;  Surgeon: Corky Crafts, MD;  Location: [facility] INVASIVE CV LAB;  Service: Cardiovascular;  Laterality: N/A;   MOUTH SURGERY     URETHRAL DILATION     Patient Active Problem List   Diagnosis Date Noted   Viral upper  respiratory tract infection 02/02/2023   Stress 02/02/2023   Insomnia 12/23/2022   Blood clotting disorder (HCC) 10/28/2022   Neck pain 10/27/2022   Acute thoracic back pain 10/27/2022   Lumbar radiculopathy 05/26/2022   Degeneration of lumbar intervertebral disc 05/26/2022   Major depressive disorder    Severe alcohol use disorder    Generalized anxiety disorder    Obstructive sleep apnea    Hepatic steatosis 04/22/2022   Osteoarthritis of carpometacarpal (CMC) joint of thumb 04/20/2022   Acquired trigger finger of right ring finger 04/20/2022   Bilateral wrist pain 04/20/2022   Chalazion of left lower eyelid 04/15/2022   LLQ pain  04/15/2022   Bipolar 1 disorder (HCC)    Cervical radiculopathy 10/14/2021   Pain in joint of right hip 07/03/2020   Rib pain 07/03/2020   Recurrent syncope    Orthostasis    Lactic acidosis 12/18/2019   Hematemesis 12/18/2019   Pain in right knee 12/14/2018   PAF (paroxysmal atrial fibrillation) 10/09/2018   Dorsalgia 09/22/2018   Dysphagia 09/22/2018   GERD without esophagitis 09/22/2018   Irritable bowel syndrome without diarrhea 09/22/2018   Osteoarthritis of knee 09/22/2018   Polypharmacy 09/22/2018   Sciatica of right side 09/22/2018   Vitamin D deficiency 09/22/2018   Mixed hyperlipidemia 05/07/2016   Chronic atrial fibrillation (HCC) 09/08/2015   Coronary artery disease involving native coronary artery with angina pectoris 03/24/2015   Essential hypertension 03/03/2015   Chest pain 02/11/2015   Non-ST elevation MI (NSTEMI) (HCC) 07/26/2008    PCP: Tamera Punt  REFERRING PROVIDER: Tamera Punt  REFERRING DIAG: acute thoracic back pain  THERAPY DIAG:  Chronic pain of both knees  Difficulty in walking, not elsewhere classified  Muscle weakness (generalized)  Rationale for Evaluation and Treatment: Rehabilitation  ONSET DATE: 11/2022  SUBJECTIVE:   SUBJECTIVE STATEMENT: Pt states she is having some continued pain in the R LB and having some symptoms in the R UE. She is seeing a physician for evaluation for nerve ablation.  PERTINENT HISTORY: Cervical DDD Coccyx fracture From eval: Pt states her knees have bothered her for years but that they have been worsening lately. She states that 2-3 weeks ago her Lt knee "went out" on her while she was standing in a line. She states she can also hear "crunching" in her knees. Pt also has sciatica down Rt LE due to a fall 3 years ago where she broke her coccyx.  She saw MD who recommended physical therapy. Knee pain increases with prolonged standing and walking, back pain increases with lifting. Pain decreases with use of voltaren gel or  meds  PAIN:  Are you having pain? Yes: NPRS scale: 0/10 currently, 7/10 after work/10 Pain location: Rt LE, bilat knees Pain description: burning, aching, sore Aggravating factors: prolonged standing and walking, lifting Relieving factors: meds  PRECAUTIONS: None  WEIGHT BEARING RESTRICTIONS: No  FALLS:  Has patient fallen in last 6 months? No  OCCUPATION: Conservation officer, nature at [employer], lots of walking, lifting and standing  PLOF: Independent  PATIENT GOALS: be able to walk for exercise, return fully to work with decreased pain  NEXT MD VISIT: July 2024  OBJECTIVE: (Measures in this section from initial evaluation unless otherwise noted)  MUSCLE LENGTH: Hamstrings: Right WFL deg; Left WFL deg  POSTURE: decreased lumbar lordosis  PALPATION: Pain with lateral patellar mobs Lt knee Crepitus with patellar mobs bilat Pain with palpation Rt glutes near sacrum TTP with UPAs Rt SIJ  Lumbar ROM: flex [facility]  Ext 75%   Rt lateral flexion 50% pain   Lt lateral flexion WFL   Rt rotation WFL pain end range   Lt rotation WFL pain end range  LOWER EXTREMITY ROM: WFL bilat  LOWER EXTREMITY MMT:  MMT Right eval Left eval Right 02/03/23 Left 02/03/23  Hip flexion 4- 4 SLR with ER 3/5 5 5   Hip extension 3+ 3+ 4+ 4+  Hip abduction 4- 4 4 4+  Hip adduction      Hip internal rotation      Hip external rotation 4 4 4+ 4+  Knee flexion 4 4 5  4+  Knee extension 4 4 5 5   Ankle dorsiflexion      Ankle plantarflexion      Ankle inversion      Ankle eversion       (Blank rows = not tested)  LOWER EXTREMITY SPECIAL TESTS:  SLR (-) bilat  GAIT: Distance walked: 100' Assistive device utilized: Single point cane Level of assistance: Modified independence Comments: antalgic gait, decreased cadence  [facility] Adult PT Treatment:                                                DATE: 02/24/23 Therapeutic Exercise:   Reviewed HEP offering suggestions to improve technique and alignment with  exercises Nustep L6 x 5 min Deadlift 15# from floor working on technique keeping core tight, hinging from hip 10 x 2  Sit <> stand 15# KB x 10 x2 5# KB each UE for carry 20 feet x 10  Step up 4 inch x 15 R/L  Xband walk red TB 2x16' Farmer's carry 10# KB each hand x 20 ft x 10  Overhead carry 5# KB 20 feet x 10  Sled push/pull x 20' no weight x 2  Leg press 90# double leg 10 x 3    [facility] Adult PT Treatment:                                                DATE: 02/22/23 Therapeutic Exercise: Nustep L6 x 5 min for warm up Leg press 90# double leg x 10, 3x8 Heel raise on 4" step 3x10 Xband walk green TB 2x16' Forwards/Backwards walking green TB 2x16' Sled push/pull 3x20' 20# (attempted 50# but could feel too much in knee) Chops 5# cables x10 Lawnmower 5# cables x10   [facility] Adult PT Treatment:                                                DATE: 02/17/23 Therapeutic Exercise: Nustep L6 x 5 min for warm up Sit <> stand 20# cables x 10, 25# x10 Dead lift 15# cables 2 x 10 Xband walk red TB 2x16' Farmer's carry 2 10# KB x 2 laps around gym Sled push/pull x20' no weight, 2x20' 50# Leg press 90# double leg 3x8    PATIENT EDUCATION:  Education details: PT POC and goals, HEP Person educated: Patient Education method: Explanation, Demonstration, and Handouts Education comprehension: verbalized understanding and returned demonstration  HOME EXERCISE PROGRAM: Access Code: [email or web address] URL: [email or web address] Date: 01/25/2023  Prepared by: Reggy Eye  Exercises - Figure 4 Bridge  - 1 x daily - 7 x weekly - 2 sets - 10 reps - Straight Leg Raise with External Rotation  - 1 x daily - 7 x weekly - 2 sets - 10 reps - Hip Extension with Resistance Loop  - 1 x daily - 7 x weekly - 3 sets - 10 reps - Diagonal Hip Extension with Resistance  - 1 x daily - 7 x weekly - 3 sets - 10 reps - Supine Hamstring Stretch with Strap  - 1 x daily - 7 x weekly - 2 sets - 30 sec hold -  Supine Quadriceps Stretch with Strap on Table  - 1 x daily - 7 x weekly - 2 sets - 30 sec hold - Supine Piriformis Stretch with Foot on Ground  - 1 x daily - 7 x weekly - 2 sets - 30 sec hold - Clamshell with Resistance  - 1 x daily - 7 x weekly - 2 sets - 10 reps - Seated Piriformis Stretch with Trunk Bend  - 1 x daily - 7 x weekly - 2 sets - 30 sec hold - Seated Hamstring Stretch  - 1 x daily - 7 x weekly - 2 sets - 30 sec hold - Dead Bug  - 1 x daily - 7 x weekly - 2 sets - 10 reps - Side Stepping with Resistance at Ankles and Counter Support  - 1 x daily - 7 x weekly - 2 sets - 10 reps - Hip Extension with Single Leg Support Prone on Table Edge  - 1 x daily - 7 x weekly - 3 sets - 10 reps - Half Deadlift with Kettlebell  - 1 x daily - 7 x weekly - 3 sets - 10 reps - Sit to Stand  - 1 x daily - 7 x weekly - 3 sets - 10 reps  ASSESSMENT:  CLINICAL IMPRESSION: Patient continued to work with strengthening and stabilization bilat LE's/core.  Reviewed HEP offering suggestions to improve technique and alignment with exercises  OBJECTIVE IMPAIRMENTS: decreased activity tolerance, difficulty walking, decreased strength, increased muscle spasms, and pain.     GOALS: Goals reviewed with patient? Yes  SHORT TERM GOALS: Target date: 01/12/2023   Pt will be independent with initial HEP Baseline: Goal status: MET   LONG TERM GOALS: Target date: 03/22/2023  Pt will be independent with advanced HEP Baseline:  Goal status: IN PROGRESS  2.  Pt will improve bilat LE strength to 4+/5 to improve standing and walking tolerance Baseline:  02/03/23: See MMT chart above Goal status: IN PROGRESS  3.  Pt will tolerate lifting 25# floor to chest with good mechanics and pain <= 2/10 Baseline:  02/03/23: 15# sit to stand and deadlifts Goal status: IN PROGRESS  4.  Pt will tolerate working full shift with knee pain <= 2/10 Baseline:  02/03/23: Has not been assigned more than 5 hours of work  still Goal status: IN PROGRESS    PLAN:  PT FREQUENCY: 2x/week  PT DURATION: 6 weeks  PLANNED INTERVENTIONS: Therapeutic exercises, Therapeutic activity, Neuromuscular re-education, Balance training, Gait training, Patient/Family education, Self Care, Joint mobilization, Aquatic Therapy, Dry Needling, Electrical stimulation, Cryotherapy, Moist heat, Taping, Vasopneumatic device, Traction, Ultrasound, Ionotophoresis 4mg /ml Dexamethasone, Manual therapy, and Re-evaluation  PLAN FOR NEXT SESSION: standing LE and core strength, lifting mechanics   Jo-Ann Johanning P Amandajo Gonder, PT 02/24/2023, 1:27 PM

## 2023-03-01 ENCOUNTER — Ambulatory Visit (INDEPENDENT_AMBULATORY_CARE_PROVIDER_SITE_OTHER): Payer: Medicare Other | Admitting: Licensed Clinical Social Worker

## 2023-03-01 ENCOUNTER — Ambulatory Visit: Payer: Medicare Other | Admitting: Physical Therapy

## 2023-03-01 ENCOUNTER — Encounter: Payer: Self-pay | Admitting: Physical Therapy

## 2023-03-01 DIAGNOSIS — R262 Difficulty in walking, not elsewhere classified: Secondary | ICD-10-CM

## 2023-03-01 DIAGNOSIS — G8929 Other chronic pain: Secondary | ICD-10-CM

## 2023-03-01 DIAGNOSIS — F1090 Alcohol use, unspecified, uncomplicated: Secondary | ICD-10-CM | POA: Diagnosis not present

## 2023-03-01 DIAGNOSIS — F411 Generalized anxiety disorder: Secondary | ICD-10-CM

## 2023-03-01 DIAGNOSIS — M6281 Muscle weakness (generalized): Secondary | ICD-10-CM

## 2023-03-01 DIAGNOSIS — F0630 Mood disorder due to known physiological condition, unspecified: Secondary | ICD-10-CM | POA: Diagnosis not present

## 2023-03-01 DIAGNOSIS — M25562 Pain in left knee: Secondary | ICD-10-CM | POA: Diagnosis not present

## 2023-03-01 DIAGNOSIS — Z789 Other specified health status: Secondary | ICD-10-CM

## 2023-03-01 DIAGNOSIS — M25561 Pain in right knee: Secondary | ICD-10-CM | POA: Diagnosis not present
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
## 2023-03-01 NOTE — Therapy (Signed)
OUTPATIENT PHYSICAL THERAPY LOWER EXTREMITY TREATMENT    Patient Name: Lindsay [name] MRN: [id number] DOB:March 09, 1956, 67 y.o., female Today's Date: 03/01/2023 DATES OF SERVICE: 12/29/22-02/15/23 END OF SESSION:  PT End of Session - 03/01/23 1405     Visit Number 14    Number of Visits 24    Date for PT Re-Evaluation 03/22/23    Authorization Type Medicare    Progress Note Due on Visit 20    PT Start Time 1405    PT Stop Time 1445    PT Time Calculation (min) 40 min    Activity Tolerance Patient tolerated treatment well    Behavior During Therapy [facility] for tasks assessed/performed              Past Medical History:  Diagnosis Date   Abnormal mammogram    Thick Tissue   Acquired trigger finger of right ring finger 04/20/2022   Arthritis    Bilateral wrist pain 04/20/2022   Bipolar 1 disorder    Cataract    OU   Cervical radiculopathy 10/14/2021   Chalazion of left lower eyelid 04/15/2022   Chest pain 02/11/2015   Chronic atrial fibrillation 09/08/2015   Colon polyp    Compressed cervical disc    Coronary artery disease involving native coronary artery with angina pectoris 03/24/2015   Dorsalgia 09/22/2018   Dysphagia 09/22/2018   Essential hypertension 03/03/2015   Generalized anxiety disorder    GERD without esophagitis 09/22/2018   Hematemesis 12/18/2019   Hepatic steatosis 04/22/2022   History of appendectomy 1963   Hypertensive retinopathy    OU   Irritable bowel syndrome without diarrhea 09/22/2018   Lactic acidosis 12/18/2019   LLQ pain 04/15/2022   Major depressive disorder    Migraine without status migrainosus, not intractable 09/22/2018   Mixed hyperlipidemia 05/07/2016   Non-ST elevation MI (NSTEMI) 07/26/2008   No cardiac catheterization at the time.   Obstructive sleep apnea    no CPAP use   Orthostasis    Osteoarthritis of carpometacarpal (CMC) joint of thumb 04/20/2022   Osteoarthritis of knee 09/22/2018   PAF (paroxysmal atrial fibrillation)  10/09/2018   Pain in joint of right hip 07/03/2020   Pain in right knee 12/14/2018   Recurrent syncope    Rib pain 07/03/2020   Sciatica of right side 09/22/2018   Severe alcohol use disorder    Vitamin D deficiency    Past Surgical History:  Procedure Laterality Date   APPENDECTOMY     CARDIAC CATHETERIZATION N/A 03/25/2015   Procedure: Left Heart Cath and Coronary Angiography;  Surgeon: Marykay Lex, MD;  Location: [facility] INVASIVE CV LAB;  Service: Cardiovascular;  Laterality: N/A;   CORONARY STENT INTERVENTION N/A 10/09/2018   Procedure: CORONARY STENT INTERVENTION;  Surgeon: Corky Crafts, MD;  Location: [facility] INVASIVE CV LAB;  Service: Cardiovascular;  Laterality: N/A;   DILATATION & CURETTAGE/HYSTEROSCOPY WITH MYOSURE N/A 05/19/2016   Procedure: DILATATION & CURETTAGE/HYSTEROSCOPY WITH MYOSURE;  Surgeon: Geryl Rankins, MD;  Location: [facility];  Service: Gynecology;  Laterality: N/A;  Possible Myosure for polyps.   EYE SURGERY     LEFT HEART CATH AND CORONARY ANGIOGRAPHY N/A 10/09/2018   Procedure: LEFT HEART CATH AND CORONARY ANGIOGRAPHY;  Surgeon: Corky Crafts, MD;  Location: [facility] INVASIVE CV LAB;  Service: Cardiovascular;  Laterality: N/A;   MOUTH SURGERY     URETHRAL DILATION     Patient Active Problem List   Diagnosis Date Noted   Viral upper respiratory tract infection 02/02/2023  Stress 02/02/2023   Insomnia 12/23/2022   Blood clotting disorder (HCC) 10/28/2022   Neck pain 10/27/2022   Acute thoracic back pain 10/27/2022   Lumbar radiculopathy 05/26/2022   Degeneration of lumbar intervertebral disc 05/26/2022   Major depressive disorder    Severe alcohol use disorder    Generalized anxiety disorder    Obstructive sleep apnea    Hepatic steatosis 04/22/2022   Osteoarthritis of carpometacarpal (CMC) joint of thumb 04/20/2022   Acquired trigger finger of right ring finger 04/20/2022   Bilateral wrist pain 04/20/2022   Chalazion of left lower eyelid 04/15/2022    LLQ pain 04/15/2022   Bipolar 1 disorder (HCC)    Cervical radiculopathy 10/14/2021   Pain in joint of right hip 07/03/2020   Rib pain 07/03/2020   Recurrent syncope    Orthostasis    Lactic acidosis 12/18/2019   Hematemesis 12/18/2019   Pain in right knee 12/14/2018   PAF (paroxysmal atrial fibrillation) 10/09/2018   Dorsalgia 09/22/2018   Dysphagia 09/22/2018   GERD without esophagitis 09/22/2018   Irritable bowel syndrome without diarrhea 09/22/2018   Osteoarthritis of knee 09/22/2018   Polypharmacy 09/22/2018   Sciatica of right side 09/22/2018   Vitamin D deficiency 09/22/2018   Mixed hyperlipidemia 05/07/2016   Chronic atrial fibrillation (HCC) 09/08/2015   Coronary artery disease involving native coronary artery with angina pectoris 03/24/2015   Essential hypertension 03/03/2015   Chest pain 02/11/2015   Non-ST elevation MI (NSTEMI) (HCC) 07/26/2008    PCP: Tamera Punt  REFERRING PROVIDER: Tamera Punt  REFERRING DIAG: acute thoracic back pain  THERAPY DIAG:  Chronic pain of both knees  Difficulty in walking, not elsewhere classified  Muscle weakness (generalized)  Rationale for Evaluation and Treatment: Rehabilitation  ONSET DATE: 11/2022  SUBJECTIVE:   SUBJECTIVE STATEMENT: Pt states her L knee went out on her while she was at work. Pt with plans to see Dr. Ethelene Hal to see if she's good for ablation. Pt saw a new doctor downstairs for mental health. Knees are doing good today. Pt reports she is now back to 11 hours of work/wk. Pt states she did have some increased generalized pain this weekend -- thinks may be due to weather.  PERTINENT HISTORY: Cervical DDD Coccyx fracture From eval: Pt states her knees have bothered her for years but that they have been worsening lately. She states that 2-3 weeks ago her Lt knee "went out" on her while she was standing in a line. She states she can also hear "crunching" in her knees. Pt also has sciatica down Rt LE due to a fall 3  years ago where she broke her coccyx.  She saw MD who recommended physical therapy. Knee pain increases with prolonged standing and walking, back pain increases with lifting. Pain decreases with use of voltaren gel or meds  PAIN:  Are you having pain? Yes: NPRS scale: 1/10 currently, 7/10 after work/10 Pain location: Rt LE, bilat knees Pain description: burning, aching, sore Aggravating factors: prolonged standing and walking, lifting Relieving factors: meds  PRECAUTIONS: None  WEIGHT BEARING RESTRICTIONS: No  FALLS:  Has patient fallen in last 6 months? No  OCCUPATION: Conservation officer, nature at [employer], lots of walking, lifting and standing  PLOF: Independent  PATIENT GOALS: be able to walk for exercise, return fully to work with decreased pain  NEXT MD VISIT: July 2024  OBJECTIVE: (Measures in this section from initial evaluation unless otherwise noted)  MUSCLE LENGTH: Hamstrings: Right WFL deg; Left WFL deg  POSTURE: decreased  lumbar lordosis  PALPATION: Pain with lateral patellar mobs Lt knee Crepitus with patellar mobs bilat Pain with palpation Rt glutes near sacrum TTP with UPAs Rt SIJ  Lumbar ROM: flex WFL   Ext 75%   Rt lateral flexion 50% pain   Lt lateral flexion WFL   Rt rotation WFL pain end range   Lt rotation WFL pain end range  LOWER EXTREMITY ROM: WFL bilat  LOWER EXTREMITY MMT:  MMT Right eval Left eval Right 02/03/23 Left 02/03/23  Hip flexion 4- 4 SLR with ER 3/5 5 5   Hip extension 3+ 3+ 4+ 4+  Hip abduction 4- 4 4 4+  Hip adduction      Hip internal rotation      Hip external rotation 4 4 4+ 4+  Knee flexion 4 4 5  4+  Knee extension 4 4 5 5   Ankle dorsiflexion      Ankle plantarflexion      Ankle inversion      Ankle eversion       (Blank rows = not tested)  LOWER EXTREMITY SPECIAL TESTS:  SLR (-) bilat  GAIT: Distance walked: 100' Assistive device utilized: Single point cane Level of assistance: Modified independence Comments:  antalgic gait, decreased cadence  [facility] Adult PT Treatment:                                                DATE: 03/01/23 Therapeutic Exercise: Nustep L5 x 6 min Quad stretch with strap standing x30 sec Standing hamstring stretch with strap 2x 30 sec Mat table plank with hip abd x10 Mat table forward plank 2x30 sec Palloff press cables 5# 2x10 x3 sec Leg press 70# double leg 2x10 (deferred 90# today)    [facility] Adult PT Treatment:                                                DATE: 02/24/23 Therapeutic Exercise:   Reviewed HEP offering suggestions to improve technique and alignment with exercises Nustep L6 x 5 min Deadlift 15# from floor working on technique keeping core tight, hinging from hip 10 x 2  Sit <> stand 15# KB x 10 x2 5# KB each UE for carry 20 feet x 10  Step up 4 inch x 15 R/L  Xband walk red TB 2x16' Farmer's carry 10# KB each hand x 20 ft x 10  Overhead carry 5# KB 20 feet x 10  Sled push/pull x 20' no weight x 2  Leg press 90# double leg 10 x 3   [facility] Adult PT Treatment:                                                DATE: 02/22/23 Therapeutic Exercise: Nustep L6 x 5 min for warm up Leg press 90# double leg x 10, 3x8 Heel raise on 4" step 3x10 Xband walk green TB 2x16' Forwards/Backwards walking green TB 2x16' Sled push/pull 3x20' 20# (attempted 50# but could feel too much in knee) Chops 5# cables x10 Lawnmower 5# cables x10    PATIENT EDUCATION:  Education details:  PT POC and goals, HEP Person educated: Patient Education method: Explanation, Demonstration, and Handouts Education comprehension: verbalized understanding and returned demonstration  HOME EXERCISE PROGRAM: Access Code: [email or web address] URL: [email or web address] Date: 01/25/2023 Prepared by: Reggy Eye  Exercises - Figure 4 Bridge  - 1 x daily - 7 x weekly - 2 sets - 10 reps - Straight Leg Raise with External Rotation  - 1 x daily - 7 x weekly - 2 sets - 10 reps - Hip Extension  with Resistance Loop  - 1 x daily - 7 x weekly - 3 sets - 10 reps - Diagonal Hip Extension with Resistance  - 1 x daily - 7 x weekly - 3 sets - 10 reps - Supine Hamstring Stretch with Strap  - 1 x daily - 7 x weekly - 2 sets - 30 sec hold - Supine Quadriceps Stretch with Strap on Table  - 1 x daily - 7 x weekly - 2 sets - 30 sec hold - Supine Piriformis Stretch with Foot on Ground  - 1 x daily - 7 x weekly - 2 sets - 30 sec hold - Clamshell with Resistance  - 1 x daily - 7 x weekly - 2 sets - 10 reps - Seated Piriformis Stretch with Trunk Bend  - 1 x daily - 7 x weekly - 2 sets - 30 sec hold - Seated Hamstring Stretch  - 1 x daily - 7 x weekly - 2 sets - 30 sec hold - Dead Bug  - 1 x daily - 7 x weekly - 2 sets - 10 reps - Side Stepping with Resistance at Ankles and Counter Support  - 1 x daily - 7 x weekly - 2 sets - 10 reps - Hip Extension with Single Leg Support Prone on Table Edge  - 1 x daily - 7 x weekly - 3 sets - 10 reps - Half Deadlift with Kettlebell  - 1 x daily - 7 x weekly - 3 sets - 10 reps - Sit to Stand  - 1 x daily - 7 x weekly - 3 sets - 10 reps  ASSESSMENT:  CLINICAL IMPRESSION: Worked on core and hip strengthening with good pt tolerance.   OBJECTIVE IMPAIRMENTS: decreased activity tolerance, difficulty walking, decreased strength, increased muscle spasms, and pain.     GOALS: Goals reviewed with patient? Yes  SHORT TERM GOALS: Target date: 01/12/2023   Pt will be independent with initial HEP Baseline: Goal status: MET   LONG TERM GOALS: Target date: 03/22/2023  Pt will be independent with advanced HEP Baseline:  Goal status: IN PROGRESS  2.  Pt will improve bilat LE strength to 4+/5 to improve standing and walking tolerance Baseline:  02/03/23: See MMT chart above Goal status: IN PROGRESS  3.  Pt will tolerate lifting 25# floor to chest with good mechanics and pain <= 2/10 Baseline:  02/03/23: 15# sit to stand and deadlifts Goal status: IN  PROGRESS  4.  Pt will tolerate working full shift with knee pain <= 2/10 Baseline:  02/03/23: Has not been assigned more than 5 hours of work still Goal status: IN PROGRESS    PLAN:  PT FREQUENCY: 2x/week  PT DURATION: 6 weeks  PLANNED INTERVENTIONS: Therapeutic exercises, Therapeutic activity, Neuromuscular re-education, Balance training, Gait training, Patient/Family education, Self Care, Joint mobilization, Aquatic Therapy, Dry Needling, Electrical stimulation, Cryotherapy, Moist heat, Taping, Vasopneumatic device, Traction, Ultrasound, Ionotophoresis 4mg /ml Dexamethasone, Manual therapy, and Re-evaluation  PLAN FOR NEXT SESSION: standing LE and core strength,  lifting mechanics   Camarion Weier April Ma L [place], PT 03/01/2023, 2:06 PM

## 2023-03-03 ENCOUNTER — Ambulatory Visit: Payer: Medicare Other | Admitting: Physical Therapy

## 2023-03-03 ENCOUNTER — Encounter: Payer: Self-pay | Admitting: Physical Therapy

## 2023-03-03 DIAGNOSIS — M25562 Pain in left knee: Secondary | ICD-10-CM | POA: Diagnosis not present

## 2023-03-03 DIAGNOSIS — G8929 Other chronic pain: Secondary | ICD-10-CM | POA: Diagnosis not present

## 2023-03-03 DIAGNOSIS — M6281 Muscle weakness (generalized): Secondary | ICD-10-CM

## 2023-03-03 DIAGNOSIS — M25561 Pain in right knee: Secondary | ICD-10-CM | POA: Diagnosis not present

## 2023-03-03 DIAGNOSIS — R262 Difficulty in walking, not elsewhere classified: Secondary | ICD-10-CM

## 2023-03-03 NOTE — Therapy (Signed)
OUTPATIENT PHYSICAL THERAPY LOWER EXTREMITY TREATMENT    Patient Name: Lindsay [name] MRN: [id number] DOB:Mar 09, 1956, 67 y.o., female Today's Date: 03/03/2023 DATES OF SERVICE: 12/29/22-02/15/23 END OF SESSION:  PT End of Session - 03/03/23 1328     Visit Number 15    Number of Visits 24    Date for PT Re-Evaluation 03/22/23    Authorization Type Medicare    Authorization - Visit Number 15    Progress Note Due on Visit 20    PT Start Time 1320    PT Stop Time 1400    PT Time Calculation (min) 40 min    Activity Tolerance Patient tolerated treatment well    Behavior During Therapy [facility] for tasks assessed/performed               Past Medical History:  Diagnosis Date   Abnormal mammogram    Thick Tissue   Acquired trigger finger of right ring finger 04/20/2022   Arthritis    Bilateral wrist pain 04/20/2022   Bipolar 1 disorder    Cataract    OU   Cervical radiculopathy 10/14/2021   Chalazion of left lower eyelid 04/15/2022   Chest pain 02/11/2015   Chronic atrial fibrillation 09/08/2015   Colon polyp    Compressed cervical disc    Coronary artery disease involving native coronary artery with angina pectoris 03/24/2015   Dorsalgia 09/22/2018   Dysphagia 09/22/2018   Essential hypertension 03/03/2015   Generalized anxiety disorder    GERD without esophagitis 09/22/2018   Hematemesis 12/18/2019   Hepatic steatosis 04/22/2022   History of appendectomy 1963   Hypertensive retinopathy    OU   Irritable bowel syndrome without diarrhea 09/22/2018   Lactic acidosis 12/18/2019   LLQ pain 04/15/2022   Major depressive disorder    Migraine without status migrainosus, not intractable 09/22/2018   Mixed hyperlipidemia 05/07/2016   Non-ST elevation MI (NSTEMI) 07/26/2008   No cardiac catheterization at the time.   Obstructive sleep apnea    no CPAP use   Orthostasis    Osteoarthritis of carpometacarpal (CMC) joint of thumb 04/20/2022   Osteoarthritis of knee 09/22/2018   PAF  (paroxysmal atrial fibrillation) 10/09/2018   Pain in joint of right hip 07/03/2020   Pain in right knee 12/14/2018   Recurrent syncope    Rib pain 07/03/2020   Sciatica of right side 09/22/2018   Severe alcohol use disorder    Vitamin D deficiency    Past Surgical History:  Procedure Laterality Date   APPENDECTOMY     CARDIAC CATHETERIZATION N/A 03/25/2015   Procedure: Left Heart Cath and Coronary Angiography;  Surgeon: Marykay Lex, MD;  Location: [facility] INVASIVE CV LAB;  Service: Cardiovascular;  Laterality: N/A;   CORONARY STENT INTERVENTION N/A 10/09/2018   Procedure: CORONARY STENT INTERVENTION;  Surgeon: Corky Crafts, MD;  Location: [facility] INVASIVE CV LAB;  Service: Cardiovascular;  Laterality: N/A;   DILATATION & CURETTAGE/HYSTEROSCOPY WITH MYOSURE N/A 05/19/2016   Procedure: DILATATION & CURETTAGE/HYSTEROSCOPY WITH MYOSURE;  Surgeon: Geryl Rankins, MD;  Location: [facility];  Service: Gynecology;  Laterality: N/A;  Possible Myosure for polyps.   EYE SURGERY     LEFT HEART CATH AND CORONARY ANGIOGRAPHY N/A 10/09/2018   Procedure: LEFT HEART CATH AND CORONARY ANGIOGRAPHY;  Surgeon: Corky Crafts, MD;  Location: [facility] INVASIVE CV LAB;  Service: Cardiovascular;  Laterality: N/A;   MOUTH SURGERY     URETHRAL DILATION     Patient Active Problem List   Diagnosis Date  Noted   Viral upper respiratory tract infection 02/02/2023   Stress 02/02/2023   Insomnia 12/23/2022   Blood clotting disorder (HCC) 10/28/2022   Neck pain 10/27/2022   Acute thoracic back pain 10/27/2022   Lumbar radiculopathy 05/26/2022   Degeneration of lumbar intervertebral disc 05/26/2022   Major depressive disorder    Severe alcohol use disorder    Generalized anxiety disorder    Obstructive sleep apnea    Hepatic steatosis 04/22/2022   Osteoarthritis of carpometacarpal (CMC) joint of thumb 04/20/2022   Acquired trigger finger of right ring finger 04/20/2022   Bilateral wrist pain 04/20/2022   Chalazion  of left lower eyelid 04/15/2022   LLQ pain 04/15/2022   Bipolar 1 disorder (HCC)    Cervical radiculopathy 10/14/2021   Pain in joint of right hip 07/03/2020   Rib pain 07/03/2020   Recurrent syncope    Orthostasis    Lactic acidosis 12/18/2019   Hematemesis 12/18/2019   Pain in right knee 12/14/2018   PAF (paroxysmal atrial fibrillation) 10/09/2018   Dorsalgia 09/22/2018   Dysphagia 09/22/2018   GERD without esophagitis 09/22/2018   Irritable bowel syndrome without diarrhea 09/22/2018   Osteoarthritis of knee 09/22/2018   Polypharmacy 09/22/2018   Sciatica of right side 09/22/2018   Vitamin D deficiency 09/22/2018   Mixed hyperlipidemia 05/07/2016   Chronic atrial fibrillation (HCC) 09/08/2015   Coronary artery disease involving native coronary artery with angina pectoris 03/24/2015   Essential hypertension 03/03/2015   Chest pain 02/11/2015   Non-ST elevation MI (NSTEMI) (HCC) 07/26/2008    PCP: Tamera Punt  REFERRING PROVIDER: Tamera Punt  REFERRING DIAG: acute thoracic back pain  THERAPY DIAG:  Chronic pain of both knees  Difficulty in walking, not elsewhere classified  Muscle weakness (generalized)  Rationale for Evaluation and Treatment: Rehabilitation  ONSET DATE: 11/2022  SUBJECTIVE:   SUBJECTIVE STATEMENT: Pt reports her R knee is acting up today. Has been feeling it more in the tendon in her bilat knees. PERTINENT HISTORY: Cervical DDD Coccyx fracture From eval: Pt states her knees have bothered her for years but that they have been worsening lately. She states that 2-3 weeks ago her Lt knee "went out" on her while she was standing in a line. She states she can also hear "crunching" in her knees. Pt also has sciatica down Rt LE due to a fall 3 years ago where she broke her coccyx.  She saw MD who recommended physical therapy. Knee pain increases with prolonged standing and walking, back pain increases with lifting. Pain decreases with use of voltaren gel or  meds  PAIN:  Are you having pain? Yes: NPRS scale: 1/10 currently, 7/10 after work/10 Pain location: Rt LE, bilat knees Pain description: burning, aching, sore Aggravating factors: prolonged standing and walking, lifting Relieving factors: meds  PRECAUTIONS: None  WEIGHT BEARING RESTRICTIONS: No  FALLS:  Has patient fallen in last 6 months? No  OCCUPATION: Conservation officer, nature at [employer], lots of walking, lifting and standing  PLOF: Independent  PATIENT GOALS: be able to walk for exercise, return fully to work with decreased pain  NEXT MD VISIT: July 2024  OBJECTIVE: (Measures in this section from initial evaluation unless otherwise noted)  MUSCLE LENGTH: Hamstrings: Right WFL deg; Left WFL deg  POSTURE: decreased lumbar lordosis  PALPATION: Pain with lateral patellar mobs Lt knee Crepitus with patellar mobs bilat Pain with palpation Rt glutes near sacrum TTP with UPAs Rt SIJ  Lumbar ROM: flex WFL   Ext 75%   Rt  lateral flexion 50% pain   Lt lateral flexion WFL   Rt rotation WFL pain end range   Lt rotation WFL pain end range  LOWER EXTREMITY ROM: WFL bilat  LOWER EXTREMITY MMT:  MMT Right eval Left eval Right 02/03/23 Left 02/03/23  Hip flexion 4- 4 SLR with ER 3/5 5 5   Hip extension 3+ 3+ 4+ 4+  Hip abduction 4- 4 4 4+  Hip adduction      Hip internal rotation      Hip external rotation 4 4 4+ 4+  Knee flexion 4 4 5  4+  Knee extension 4 4 5 5   Ankle dorsiflexion      Ankle plantarflexion      Ankle inversion      Ankle eversion       (Blank rows = not tested)  LOWER EXTREMITY SPECIAL TESTS:  SLR (-) bilat  GAIT: Distance walked: 100' Assistive device utilized: Single point cane Level of assistance: Modified independence Comments: antalgic gait, decreased cadence  [facility] Adult PT Treatment:                                                DATE: 03/03/23 Therapeutic Exercise: Seated hamstring stretch x30 sec Seated figure 4 stretch x 30 sec Seated hip  flexor stretch x30 sec Prone quad stretch 2x30 sec Supine SLR 2x10 Manual Therapy: IASTM, STM, & TPR bilat quads, gastroc, adductors K-tape: 2 "I" strips along medial and lateral quad Patellar mobilization Self Care: How to apply k-tape to support patellar tendon  [facility] Adult PT Treatment:                                                DATE: 03/01/23 Therapeutic Exercise: Nustep L5 x 6 min Quad stretch with strap standing x30 sec Standing hamstring stretch with strap 2x 30 sec Mat table plank with hip abd x10 Mat table forward plank 2x30 sec Palloff press cables 5# 2x10 x3 sec Leg press 70# double leg 2x10 (deferred 90# today)    [facility] Adult PT Treatment:                                                DATE: 02/24/23 Therapeutic Exercise:   Reviewed HEP offering suggestions to improve technique and alignment with exercises Nustep L6 x 5 min Deadlift 15# from floor working on technique keeping core tight, hinging from hip 10 x 2  Sit <> stand 15# KB x 10 x2 5# KB each UE for carry 20 feet x 10  Step up 4 inch x 15 R/L  Xband walk red TB 2x16' Farmer's carry 10# KB each hand x 20 ft x 10  Overhead carry 5# KB 20 feet x 10  Sled push/pull x 20' no weight x 2  Leg press 90# double leg 10 x 3    PATIENT EDUCATION:  Education details: PT POC and goals, HEP Person educated: Patient Education method: Explanation, Demonstration, and Handouts Education comprehension: verbalized understanding and returned demonstration  HOME EXERCISE PROGRAM: Access Code: [email or web address] URL: [email or web address] Date: 01/25/2023 Prepared by: Reggy Eye  Exercises - Figure 4 Bridge  - 1 x daily - 7 x weekly - 2 sets - 10 reps - Straight Leg Raise with External Rotation  - 1 x daily - 7 x weekly - 2 sets - 10 reps - Hip Extension with Resistance Loop  - 1 x daily - 7 x weekly - 3 sets - 10 reps - Diagonal Hip Extension with Resistance  - 1 x daily - 7 x weekly - 3 sets - 10 reps -  Supine Hamstring Stretch with Strap  - 1 x daily - 7 x weekly - 2 sets - 30 sec hold - Supine Quadriceps Stretch with Strap on Table  - 1 x daily - 7 x weekly - 2 sets - 30 sec hold - Supine Piriformis Stretch with Foot on Ground  - 1 x daily - 7 x weekly - 2 sets - 30 sec hold - Clamshell with Resistance  - 1 x daily - 7 x weekly - 2 sets - 10 reps - Seated Piriformis Stretch with Trunk Bend  - 1 x daily - 7 x weekly - 2 sets - 30 sec hold - Seated Hamstring Stretch  - 1 x daily - 7 x weekly - 2 sets - 30 sec hold - Dead Bug  - 1 x daily - 7 x weekly - 2 sets - 10 reps - Side Stepping with Resistance at Ankles and Counter Support  - 1 x daily - 7 x weekly - 2 sets - 10 reps - Hip Extension with Single Leg Support Prone on Table Edge  - 1 x daily - 7 x weekly - 3 sets - 10 reps - Half Deadlift with Kettlebell  - 1 x daily - 7 x weekly - 3 sets - 10 reps - Sit to Stand  - 1 x daily - 7 x weekly - 3 sets - 10 reps  ASSESSMENT:  CLINICAL IMPRESSION: Decreased intensity of strengthening today due to pt reporting increased s/s of patellar tendonitis. Performed more active recovery today. Manual work to address quad tightness and ktape to better support patellar tendons.   OBJECTIVE IMPAIRMENTS: decreased activity tolerance, difficulty walking, decreased strength, increased muscle spasms, and pain.     GOALS: Goals reviewed with patient? Yes  SHORT TERM GOALS: Target date: 01/12/2023   Pt will be independent with initial HEP Baseline: Goal status: MET   LONG TERM GOALS: Target date: 03/22/2023  Pt will be independent with advanced HEP Baseline:  Goal status: IN PROGRESS  2.  Pt will improve bilat LE strength to 4+/5 to improve standing and walking tolerance Baseline:  02/03/23: See MMT chart above Goal status: IN PROGRESS  3.  Pt will tolerate lifting 25# floor to chest with good mechanics and pain <= 2/10 Baseline:  02/03/23: 15# sit to stand and deadlifts Goal status: IN  PROGRESS  4.  Pt will tolerate working full shift with knee pain <= 2/10 Baseline:  02/03/23: Has not been assigned more than 5 hours of work still Goal status: IN PROGRESS    PLAN:  PT FREQUENCY: 2x/week  PT DURATION: 6 weeks  PLANNED INTERVENTIONS: Therapeutic exercises, Therapeutic activity, Neuromuscular re-education, Balance training, Gait training, Patient/Family education, Self Care, Joint mobilization, Aquatic Therapy, Dry Needling, Electrical stimulation, Cryotherapy, Moist heat, Taping, Vasopneumatic device, Traction, Ultrasound, Ionotophoresis 4mg /ml Dexamethasone, Manual therapy, and Re-evaluation  PLAN FOR NEXT SESSION: standing LE and core strength, lifting mechanics   Maeci Kalbfleisch April Ma L Idania Desouza, PT 03/03/2023, 2:08 PM

## 2023-03-14 NOTE — Therapy (Signed)
OUTPATIENT PHYSICAL THERAPY LOWER EXTREMITY TREATMENT    Patient Name: Lindsay [name] MRN: [id number] DOB:11-08-1955, 67 y.o., female Today's Date: 03/15/2023 DATES OF SERVICE: 12/29/22-02/15/23 END OF SESSION:  PT End of Session - 03/15/23 1151     Visit Number 16    Number of Visits 24    Date for PT Re-Evaluation 03/22/23    Authorization Type Medicare    Authorization - Visit Number 16    Progress Note Due on Visit 20    PT Start Time 1152    PT Stop Time 1230    PT Time Calculation (min) 38 min    Activity Tolerance Patient tolerated treatment well    Behavior During Therapy [facility] for tasks assessed/performed                Past Medical History:  Diagnosis Date   Abnormal mammogram    Thick Tissue   Acquired trigger finger of right ring finger 04/20/2022   Arthritis    Bilateral wrist pain 04/20/2022   Bipolar 1 disorder    Cataract    OU   Cervical radiculopathy 10/14/2021   Chalazion of left lower eyelid 04/15/2022   Chest pain 02/11/2015   Chronic atrial fibrillation 09/08/2015   Colon polyp    Compressed cervical disc    Coronary artery disease involving native coronary artery with angina pectoris 03/24/2015   Dorsalgia 09/22/2018   Dysphagia 09/22/2018   Essential hypertension 03/03/2015   Generalized anxiety disorder    GERD without esophagitis 09/22/2018   Hematemesis 12/18/2019   Hepatic steatosis 04/22/2022   History of appendectomy 1963   Hypertensive retinopathy    OU   Irritable bowel syndrome without diarrhea 09/22/2018   Lactic acidosis 12/18/2019   LLQ pain 04/15/2022   Major depressive disorder    Migraine without status migrainosus, not intractable 09/22/2018   Mixed hyperlipidemia 05/07/2016   Non-ST elevation MI (NSTEMI) 07/26/2008   No cardiac catheterization at the time.   Obstructive sleep apnea    no CPAP use   Orthostasis    Osteoarthritis of carpometacarpal (CMC) joint of thumb 04/20/2022   Osteoarthritis of knee 09/22/2018    PAF (paroxysmal atrial fibrillation) 10/09/2018   Pain in joint of right hip 07/03/2020   Pain in right knee 12/14/2018   Recurrent syncope    Rib pain 07/03/2020   Sciatica of right side 09/22/2018   Severe alcohol use disorder    Vitamin D deficiency    Past Surgical History:  Procedure Laterality Date   APPENDECTOMY     CARDIAC CATHETERIZATION N/A 03/25/2015   Procedure: Left Heart Cath and Coronary Angiography;  Surgeon: Marykay Lex, MD;  Location: [facility] INVASIVE CV LAB;  Service: Cardiovascular;  Laterality: N/A;   CORONARY STENT INTERVENTION N/A 10/09/2018   Procedure: CORONARY STENT INTERVENTION;  Surgeon: Corky Crafts, MD;  Location: [facility] INVASIVE CV LAB;  Service: Cardiovascular;  Laterality: N/A;   DILATATION & CURETTAGE/HYSTEROSCOPY WITH MYOSURE N/A 05/19/2016   Procedure: DILATATION & CURETTAGE/HYSTEROSCOPY WITH MYOSURE;  Surgeon: Geryl Rankins, MD;  Location: [facility];  Service: Gynecology;  Laterality: N/A;  Possible Myosure for polyps.   EYE SURGERY     LEFT HEART CATH AND CORONARY ANGIOGRAPHY N/A 10/09/2018   Procedure: LEFT HEART CATH AND CORONARY ANGIOGRAPHY;  Surgeon: Corky Crafts, MD;  Location: [facility] INVASIVE CV LAB;  Service: Cardiovascular;  Laterality: N/A;   MOUTH SURGERY     URETHRAL DILATION     Patient Active Problem List   Diagnosis  Date Noted   Viral upper respiratory tract infection 02/02/2023   Stress 02/02/2023   Insomnia 12/23/2022   Blood clotting disorder (HCC) 10/28/2022   Neck pain 10/27/2022   Acute thoracic back pain 10/27/2022   Lumbar radiculopathy 05/26/2022   Degeneration of lumbar intervertebral disc 05/26/2022   Major depressive disorder    Severe alcohol use disorder    Generalized anxiety disorder    Obstructive sleep apnea    Hepatic steatosis 04/22/2022   Osteoarthritis of carpometacarpal (CMC) joint of thumb 04/20/2022   Acquired trigger finger of right ring finger 04/20/2022   Bilateral wrist pain 04/20/2022    Chalazion of left lower eyelid 04/15/2022   LLQ pain 04/15/2022   Bipolar 1 disorder (HCC)    Cervical radiculopathy 10/14/2021   Pain in joint of right hip 07/03/2020   Rib pain 07/03/2020   Recurrent syncope    Orthostasis    Lactic acidosis 12/18/2019   Hematemesis 12/18/2019   Pain in right knee 12/14/2018   PAF (paroxysmal atrial fibrillation) 10/09/2018   Dorsalgia 09/22/2018   Dysphagia 09/22/2018   GERD without esophagitis 09/22/2018   Irritable bowel syndrome without diarrhea 09/22/2018   Osteoarthritis of knee 09/22/2018   Polypharmacy 09/22/2018   Sciatica of right side 09/22/2018   Vitamin D deficiency 09/22/2018   Mixed hyperlipidemia 05/07/2016   Chronic atrial fibrillation (HCC) 09/08/2015   Coronary artery disease involving native coronary artery with angina pectoris 03/24/2015   Essential hypertension 03/03/2015   Chest pain 02/11/2015   Non-ST elevation MI (NSTEMI) (HCC) 07/26/2008    PCP: Tamera Punt  REFERRING PROVIDER: Tamera Punt  REFERRING DIAG: acute thoracic back pain  THERAPY DIAG:  Chronic pain of both knees  Difficulty in walking, not elsewhere classified  Muscle weakness (generalized)  Rationale for Evaluation and Treatment: Rehabilitation  ONSET DATE: 11/2022  SUBJECTIVE:   SUBJECTIVE STATEMENT: My knees are really bad today and have been since last visit. Feeling hamstrings and patellar tendons B. I've been lifting a lot of bags at work. L knee has been giving out for the past few days.   PERTINENT HISTORY: Cervical DDD Coccyx fracture From eval: Pt states her knees have bothered her for years but that they have been worsening lately. She states that 2-3 weeks ago her Lt knee "went out" on her while she was standing in a line. She states she can also hear "crunching" in her knees. Pt also has sciatica down Rt LE due to a fall 3 years ago where she broke her coccyx.  She saw MD who recommended physical therapy. Knee pain increases with prolonged  standing and walking, back pain increases with lifting. Pain decreases with use of voltaren gel or meds  PAIN:  Are you having pain? Yes: NPRS scale: 3/10 currently, 7/10 after work/10 Pain location: Rt LE, bilat knees Pain description: burning, aching, sore Aggravating factors: prolonged standing and walking, lifting Relieving factors: meds  PRECAUTIONS: None  WEIGHT BEARING RESTRICTIONS: No  FALLS:  Has patient fallen in last 6 months? No  OCCUPATION: Conservation officer, nature at [employer], lots of walking, lifting and standing  PLOF: Independent  PATIENT GOALS: be able to walk for exercise, return fully to work with decreased pain  NEXT MD VISIT: July 2024  OBJECTIVE: (Measures in this section from initial evaluation unless otherwise noted)  MUSCLE LENGTH: Hamstrings: Right WFL deg; Left WFL deg  POSTURE: decreased lumbar lordosis  PALPATION: Pain with lateral patellar mobs Lt knee Crepitus with patellar mobs bilat Pain with palpation Rt  glutes near sacrum TTP with UPAs Rt SIJ  Lumbar ROM: flex WFL   Ext 75%   Rt lateral flexion 50% pain   Lt lateral flexion WFL   Rt rotation WFL pain end range   Lt rotation WFL pain end range  LOWER EXTREMITY ROM: WFL bilat  LOWER EXTREMITY MMT:  MMT Right eval Left eval Right 02/03/23 Left 02/03/23  Hip flexion 4- 4 SLR with ER 3/5 5 5   Hip extension 3+ 3+ 4+ 4+  Hip abduction 4- 4 4 4+  Hip adduction      Hip internal rotation      Hip external rotation 4 4 4+ 4+  Knee flexion 4 4 5  4+  Knee extension 4 4 5 5   Ankle dorsiflexion      Ankle plantarflexion      Ankle inversion      Ankle eversion       (Blank rows = not tested)  LOWER EXTREMITY SPECIAL TESTS:  SLR (-) bilat  GAIT: Distance walked: 100' Assistive device utilized: Single point cane Level of assistance: Modified independence Comments: antalgic gait, decreased cadence  [facility] Adult PT Treatment:                                                DATE:  03/15/23 Therapeutic Exercise: Bike L 2x 6 min Standing lumbar extension x 10 Supine HS stretch with strap x 1 min B Supine SLR x 20, with ER x 10 Seated fig 4 B 2x30 sec ea Quad stretch with strap standing x30 sec B standard plank x 20 sec Modified plank on elbows and knees x 30 sec Heel raises on low step with gastroc stretch on lowering x 20 Leg press 70# double leg 3x10   [facility] Adult PT Treatment:                                                DATE: 03/03/23 Therapeutic Exercise: Seated hamstring stretch x30 sec Seated figure 4 stretch x 30 sec Seated hip flexor stretch x30 sec Prone quad stretch 2x30 sec Supine SLR 2x10 Manual Therapy: IASTM, STM, & TPR bilat quads, gastroc, adductors K-tape: 2 "I" strips along medial and lateral quad Patellar mobilization Self Care: How to apply k-tape to support patellar tendon  [facility] Adult PT Treatment:                                                DATE: 03/01/23 Therapeutic Exercise: Nustep L5 x 6 min Quad stretch with strap standing x30 sec Standing hamstring stretch with strap 2x 30 sec Mat table plank with hip abd x10 Mat table forward plank 2x30 sec Palloff press cables 5# 2x10 x3 sec Leg press 70# double leg 2x10 (deferred 90# today)    [facility] Adult PT Treatment:                                                DATE: 02/24/23 Therapeutic  Exercise:   Reviewed HEP offering suggestions to improve technique and alignment with exercises Nustep L6 x 5 min Deadlift 15# from floor working on technique keeping core tight, hinging from hip 10 x 2  Sit <> stand 15# KB x 10 x2 5# KB each UE for carry 20 feet x 10  Step up 4 inch x 15 R/L  Xband walk red TB 2x16' Farmer's carry 10# KB each hand x 20 ft x 10  Overhead carry 5# KB 20 feet x 10  Sled push/pull x 20' no weight x 2  Leg press 90# double leg 10 x 3    PATIENT EDUCATION:  Education details: PT POC and goals, HEP Person educated: Patient Education method: Explanation,  Demonstration, and Handouts Education comprehension: verbalized understanding and returned demonstration  HOME EXERCISE PROGRAM: Access Code: [email or web address] URL: [email or web address] Date: 01/25/2023 Prepared by: Reggy Eye  Exercises - Figure 4 Bridge  - 1 x daily - 7 x weekly - 2 sets - 10 reps - Straight Leg Raise with External Rotation  - 1 x daily - 7 x weekly - 2 sets - 10 reps - Hip Extension with Resistance Loop  - 1 x daily - 7 x weekly - 3 sets - 10 reps - Diagonal Hip Extension with Resistance  - 1 x daily - 7 x weekly - 3 sets - 10 reps - Supine Hamstring Stretch with Strap  - 1 x daily - 7 x weekly - 2 sets - 30 sec hold - Supine Quadriceps Stretch with Strap on Table  - 1 x daily - 7 x weekly - 2 sets - 30 sec hold - Supine Piriformis Stretch with Foot on Ground  - 1 x daily - 7 x weekly - 2 sets - 30 sec hold - Clamshell with Resistance  - 1 x daily - 7 x weekly - 2 sets - 10 reps - Seated Piriformis Stretch with Trunk Bend  - 1 x daily - 7 x weekly - 2 sets - 30 sec hold - Seated Hamstring Stretch  - 1 x daily - 7 x weekly - 2 sets - 30 sec hold - Dead Bug  - 1 x daily - 7 x weekly - 2 sets - 10 reps - Side Stepping with Resistance at Ankles and Counter Support  - 1 x daily - 7 x weekly - 2 sets - 10 reps - Hip Extension with Single Leg Support Prone on Table Edge  - 1 x daily - 7 x weekly - 3 sets - 10 reps - Half Deadlift with Kettlebell  - 1 x daily - 7 x weekly - 3 sets - 10 reps - Sit to Stand  - 1 x daily - 7 x weekly - 3 sets - 10 reps  ASSESSMENT:  CLINICAL IMPRESSION: Kathalyn presents with reports of increased knee pain and says her L leg has been giving out more frequently. She tolerated TE well today and reported decreased pain at end of session. She responded well to standing lumbar extension to help with back pain as well. Plan to work on more lifting next visit.    OBJECTIVE IMPAIRMENTS: decreased activity tolerance, difficulty walking,  decreased strength, increased muscle spasms, and pain.     GOALS: Goals reviewed with patient? Yes  SHORT TERM GOALS: Target date: 01/12/2023   Pt will be independent with initial HEP Baseline: Goal status: MET   LONG TERM GOALS: Target date: 03/22/2023  Pt will be independent with advanced HEP Baseline:  Goal status: IN PROGRESS  2.  Pt will improve bilat LE strength to 4+/5 to improve standing and walking tolerance Baseline:  02/03/23: See MMT chart above Goal status: IN PROGRESS  3.  Pt will tolerate lifting 25# floor to chest with good mechanics and pain <= 2/10 Baseline:  02/03/23: 15# sit to stand and deadlifts Goal status: IN PROGRESS  4.  Pt will tolerate working full shift with knee pain <= 2/10 Baseline:  02/03/23: Has not been assigned more than 5 hours of work still Goal status: IN PROGRESS    PLAN:  PT FREQUENCY: 2x/week  PT DURATION: 6 weeks  PLANNED INTERVENTIONS: Therapeutic exercises, Therapeutic activity, Neuromuscular re-education, Balance training, Gait training, Patient/Family education, Self Care, Joint mobilization, Aquatic Therapy, Dry Needling, Electrical stimulation, Cryotherapy, Moist heat, Taping, Vasopneumatic device, Traction, Ultrasound, Ionotophoresis 4mg /ml Dexamethasone, Manual therapy, and Re-evaluation  PLAN FOR NEXT SESSION: standing LE and core strength, lifting mechanics   Solon Palm, PT  03/15/2023, 12:34 PM

## 2023-03-15 ENCOUNTER — Encounter: Payer: Self-pay | Admitting: Physical Therapy

## 2023-03-15 ENCOUNTER — Ambulatory Visit: Payer: Medicare Other | Admitting: Physical Therapy

## 2023-03-15 DIAGNOSIS — R262 Difficulty in walking, not elsewhere classified: Secondary | ICD-10-CM

## 2023-03-15 DIAGNOSIS — M6281 Muscle weakness (generalized): Secondary | ICD-10-CM

## 2023-03-15 DIAGNOSIS — M25561 Pain in right knee: Secondary | ICD-10-CM | POA: Diagnosis not present

## 2023-03-15 DIAGNOSIS — G8929 Other chronic pain: Secondary | ICD-10-CM

## 2023-03-15 DIAGNOSIS — M25562 Pain in left knee: Secondary | ICD-10-CM | POA: Diagnosis not present

## 2023-03-16 NOTE — Therapy (Signed)
OUTPATIENT PHYSICAL THERAPY LOWER EXTREMITY TREATMENT    Patient Name: Lindsay [name] MRN: [id number] DOB:Aug 28, 1955, 67 y.o., female Today's Date: 03/17/2023 DATES OF SERVICE: 12/29/22-02/15/23 END OF SESSION:  PT End of Session - 03/17/23 1151     Visit Number 17    Number of Visits 24    Date for PT Re-Evaluation 03/22/23    Authorization Type Medicare    Authorization - Visit Number 17    Progress Note Due on Visit 20    PT Start Time 1151    PT Stop Time 1233    PT Time Calculation (min) 42 min    Behavior During Therapy [facility] for tasks assessed/performed                 Past Medical History:  Diagnosis Date   Abnormal mammogram    Thick Tissue   Acquired trigger finger of right ring finger 04/20/2022   Arthritis    Bilateral wrist pain 04/20/2022   Bipolar 1 disorder    Cataract    OU   Cervical radiculopathy 10/14/2021   Chalazion of left lower eyelid 04/15/2022   Chest pain 02/11/2015   Chronic atrial fibrillation 09/08/2015   Colon polyp    Compressed cervical disc    Coronary artery disease involving native coronary artery with angina pectoris 03/24/2015   Dorsalgia 09/22/2018   Dysphagia 09/22/2018   Essential hypertension 03/03/2015   Generalized anxiety disorder    GERD without esophagitis 09/22/2018   Hematemesis 12/18/2019   Hepatic steatosis 04/22/2022   History of appendectomy 1963   Hypertensive retinopathy    OU   Irritable bowel syndrome without diarrhea 09/22/2018   Lactic acidosis 12/18/2019   LLQ pain 04/15/2022   Major depressive disorder    Migraine without status migrainosus, not intractable 09/22/2018   Mixed hyperlipidemia 05/07/2016   Non-ST elevation MI (NSTEMI) 07/26/2008   No cardiac catheterization at the time.   Obstructive sleep apnea    no CPAP use   Orthostasis    Osteoarthritis of carpometacarpal (CMC) joint of thumb 04/20/2022   Osteoarthritis of knee 09/22/2018   PAF (paroxysmal atrial fibrillation) 10/09/2018    Pain in joint of right hip 07/03/2020   Pain in right knee 12/14/2018   Recurrent syncope    Rib pain 07/03/2020   Sciatica of right side 09/22/2018   Severe alcohol use disorder    Vitamin D deficiency    Past Surgical History:  Procedure Laterality Date   APPENDECTOMY     CARDIAC CATHETERIZATION N/A 03/25/2015   Procedure: Left Heart Cath and Coronary Angiography;  Surgeon: Marykay Lex, MD;  Location: [facility] INVASIVE CV LAB;  Service: Cardiovascular;  Laterality: N/A;   CORONARY STENT INTERVENTION N/A 10/09/2018   Procedure: CORONARY STENT INTERVENTION;  Surgeon: Corky Crafts, MD;  Location: [facility] INVASIVE CV LAB;  Service: Cardiovascular;  Laterality: N/A;   DILATATION & CURETTAGE/HYSTEROSCOPY WITH MYOSURE N/A 05/19/2016   Procedure: DILATATION & CURETTAGE/HYSTEROSCOPY WITH MYOSURE;  Surgeon: Geryl Rankins, MD;  Location: [facility];  Service: Gynecology;  Laterality: N/A;  Possible Myosure for polyps.   EYE SURGERY     LEFT HEART CATH AND CORONARY ANGIOGRAPHY N/A 10/09/2018   Procedure: LEFT HEART CATH AND CORONARY ANGIOGRAPHY;  Surgeon: Corky Crafts, MD;  Location: [facility] INVASIVE CV LAB;  Service: Cardiovascular;  Laterality: N/A;   MOUTH SURGERY     URETHRAL DILATION     Patient Active Problem List   Diagnosis Date Noted   Viral upper respiratory tract  infection 02/02/2023   Stress 02/02/2023   Insomnia 12/23/2022   Blood clotting disorder (HCC) 10/28/2022   Neck pain 10/27/2022   Acute thoracic back pain 10/27/2022   Lumbar radiculopathy 05/26/2022   Degeneration of lumbar intervertebral disc 05/26/2022   Major depressive disorder    Severe alcohol use disorder    Generalized anxiety disorder    Obstructive sleep apnea    Hepatic steatosis 04/22/2022   Osteoarthritis of carpometacarpal (CMC) joint of thumb 04/20/2022   Acquired trigger finger of right ring finger 04/20/2022   Bilateral wrist pain 04/20/2022   Chalazion of left lower eyelid 04/15/2022   LLQ pain  04/15/2022   Bipolar 1 disorder (HCC)    Cervical radiculopathy 10/14/2021   Pain in joint of right hip 07/03/2020   Rib pain 07/03/2020   Recurrent syncope    Orthostasis    Lactic acidosis 12/18/2019   Hematemesis 12/18/2019   Pain in right knee 12/14/2018   PAF (paroxysmal atrial fibrillation) 10/09/2018   Dorsalgia 09/22/2018   Dysphagia 09/22/2018   GERD without esophagitis 09/22/2018   Irritable bowel syndrome without diarrhea 09/22/2018   Osteoarthritis of knee 09/22/2018   Polypharmacy 09/22/2018   Sciatica of right side 09/22/2018   Vitamin D deficiency 09/22/2018   Mixed hyperlipidemia 05/07/2016   Chronic atrial fibrillation (HCC) 09/08/2015   Coronary artery disease involving native coronary artery with angina pectoris 03/24/2015   Essential hypertension 03/03/2015   Chest pain 02/11/2015   Non-ST elevation MI (NSTEMI) (HCC) 07/26/2008    PCP: Tamera Punt  REFERRING PROVIDER: Tamera Punt  REFERRING DIAG: acute thoracic back pain  THERAPY DIAG:  Chronic pain of both knees  Difficulty in walking, not elsewhere classified  Muscle weakness (generalized)  Rationale for Evaluation and Treatment: Rehabilitation  ONSET DATE: 11/2022  SUBJECTIVE:   SUBJECTIVE STATEMENT: I'm feeling it in my hamstrings today. I worked for 1.5 hours in my yard yesterday picking up debris.  PERTINENT HISTORY: Cervical DDD Coccyx fracture From eval: Pt states her knees have bothered her for years but that they have been worsening lately. She states that 2-3 weeks ago her Lt knee "went out" on her while she was standing in a line. She states she can also hear "crunching" in her knees. Pt also has sciatica down Rt LE due to a fall 3 years ago where she broke her coccyx.  She saw MD who recommended physical therapy. Knee pain increases with prolonged standing and walking, back pain increases with lifting. Pain decreases with use of voltaren gel or meds  PAIN:  Are you having pain? Yes: NPRS  scale: 1-2/10 currently, 7/10 after work/10 Pain location: Rt LE, bilat knees Pain description: burning, aching, sore Aggravating factors: prolonged standing and walking, lifting Relieving factors: meds  PRECAUTIONS: None  WEIGHT BEARING RESTRICTIONS: No  FALLS:  Has patient fallen in last 6 months? No  OCCUPATION: Conservation officer, nature at [employer], lots of walking, lifting and standing  PLOF: Independent  PATIENT GOALS: be able to walk for exercise, return fully to work with decreased pain  NEXT MD VISIT: July 2024  OBJECTIVE: (Measures in this section from initial evaluation unless otherwise noted)  MUSCLE LENGTH: Hamstrings: Right WFL deg; Left WFL deg  POSTURE: decreased lumbar lordosis  PALPATION: Pain with lateral patellar mobs Lt knee Crepitus with patellar mobs bilat Pain with palpation Rt glutes near sacrum TTP with UPAs Rt SIJ  Lumbar ROM: flex WFL   Ext 75%   Rt lateral flexion 50% pain   Lt lateral  flexion WFL   Rt rotation WFL pain end range   Lt rotation WFL pain end range  LOWER EXTREMITY ROM: WFL bilat  LOWER EXTREMITY MMT:  MMT Right eval Left eval Right 02/03/23 Left 02/03/23  Hip flexion 4- 4 SLR with ER 3/5 5 5   Hip extension 3+ 3+ 4+ 4+  Hip abduction 4- 4 4 4+  Hip adduction      Hip internal rotation      Hip external rotation 4 4 4+ 4+  Knee flexion 4 4 5  4+  Knee extension 4 4 5 5   Ankle dorsiflexion      Ankle plantarflexion      Ankle inversion      Ankle eversion       (Blank rows = not tested)  LOWER EXTREMITY SPECIAL TESTS:  SLR (-) bilat  GAIT: Distance walked: 100' Assistive device utilized: Single point cane Level of assistance: Modified independence Comments: antalgic gait, decreased cadence  [facility] Adult PT Treatment:                                                DATE: 03/17/23 Therapeutic Exercise: Nustep L 5 x 6 min Floor to chest lifting 17# box x 3, 22# xx 2, 27# x 2 Standing lumbar extension x 3 Supine HS  stretch with strap x 1 min B Quad stretch with strap standing x30 sec B Modified plank on elbows and knees x 30 sec, then with one leg lift x 10 sec B Leg press 70# double leg 3x10   [facility] Adult PT Treatment:                                                DATE: 03/15/23 Therapeutic Exercise: Bike L 2x 6 min Standing lumbar extension x 10 Supine HS stretch with strap x 1 min B Supine SLR x 20, with ER x 10 Seated fig 4 B 2x30 sec ea Quad stretch with strap standing x30 sec B standard plank x 20 sec Modified plank on elbows and knees x 30 sec Heel raises on low step with gastroc stretch on lowering x 20 Leg press 70# double leg 3x10   [facility] Adult PT Treatment:                                                DATE: 03/03/23 Therapeutic Exercise: Seated hamstring stretch x30 sec Seated figure 4 stretch x 30 sec Seated hip flexor stretch x30 sec Prone quad stretch 2x30 sec Supine SLR 2x10 Manual Therapy: IASTM, STM, & TPR bilat quads, gastroc, adductors K-tape: 2 "I" strips along medial and lateral quad Patellar mobilization Self Care: How to apply k-tape to support patellar tendon  [facility] Adult PT Treatment:                                                DATE: 03/01/23 Therapeutic Exercise: Nustep L5 x 6 min Quad stretch with strap standing x30  sec Standing hamstring stretch with strap 2x 30 sec Mat table plank with hip abd x10 Mat table forward plank 2x30 sec Palloff press cables 5# 2x10 x3 sec Leg press 70# double leg 2x10 (deferred 90# today)    [facility] Adult PT Treatment:                                                DATE: 02/24/23 Therapeutic Exercise:   Reviewed HEP offering suggestions to improve technique and alignment with exercises Nustep L6 x 5 min Deadlift 15# from floor working on technique keeping core tight, hinging from hip 10 x 2  Sit <> stand 15# KB x 10 x2 5# KB each UE for carry 20 feet x 10  Step up 4 inch x 15 R/L  Xband walk red TB 2x16' Farmer's carry 10# KB  each hand x 20 ft x 10  Overhead carry 5# KB 20 feet x 10  Sled push/pull x 20' no weight x 2  Leg press 90# double leg 10 x 3    PATIENT EDUCATION:  Education details: PT POC and goals, HEP Person educated: Patient Education method: Explanation, Demonstration, and Handouts Education comprehension: verbalized understanding and returned demonstration  HOME EXERCISE PROGRAM: Access Code: [email or web address] URL: [email or web address] Date: 01/25/2023 Prepared by: Reggy Eye  Exercises - Figure 4 Bridge  - 1 x daily - 7 x weekly - 2 sets - 10 reps - Straight Leg Raise with External Rotation  - 1 x daily - 7 x weekly - 2 sets - 10 reps - Hip Extension with Resistance Loop  - 1 x daily - 7 x weekly - 3 sets - 10 reps - Diagonal Hip Extension with Resistance  - 1 x daily - 7 x weekly - 3 sets - 10 reps - Supine Hamstring Stretch with Strap  - 1 x daily - 7 x weekly - 2 sets - 30 sec hold - Supine Quadriceps Stretch with Strap on Table  - 1 x daily - 7 x weekly - 2 sets - 30 sec hold - Supine Piriformis Stretch with Foot on Ground  - 1 x daily - 7 x weekly - 2 sets - 30 sec hold - Clamshell with Resistance  - 1 x daily - 7 x weekly - 2 sets - 10 reps - Seated Piriformis Stretch with Trunk Bend  - 1 x daily - 7 x weekly - 2 sets - 30 sec hold - Seated Hamstring Stretch  - 1 x daily - 7 x weekly - 2 sets - 30 sec hold - Dead Bug  - 1 x daily - 7 x weekly - 2 sets - 10 reps - Side Stepping with Resistance at Ankles and Counter Support  - 1 x daily - 7 x weekly - 2 sets - 10 reps - Hip Extension with Single Leg Support Prone on Table Edge  - 1 x daily - 7 x weekly - 3 sets - 10 reps - Half Deadlift with Kettlebell  - 1 x daily - 7 x weekly - 3 sets - 10 reps - Sit to Stand  - 1 x daily - 7 x weekly - 3 sets - 10 reps  ASSESSMENT:  CLINICAL IMPRESSION: Saniyha did well with lifting, Her form is good and she was able to progress quickly from 17# box to 27# with no increase  in back pain.  She had some discomfort in her R shoulder but with scapular retraction this improved. Modified plank was progressed to single leg as well.   Addine continues to demonstrate potential for improvement and would benefit from continued skilled therapy to address impairments.    OBJECTIVE IMPAIRMENTS: decreased activity tolerance, difficulty walking, decreased strength, increased muscle spasms, and pain.     GOALS: Goals reviewed with patient? Yes  SHORT TERM GOALS: Target date: 01/12/2023   Pt will be independent with initial HEP Baseline: Goal status: MET   LONG TERM GOALS: Target date: 03/22/2023  Pt will be independent with advanced HEP Baseline:  Goal status: IN PROGRESS  2.  Pt will improve bilat LE strength to 4+/5 to improve standing and walking tolerance Baseline:  02/03/23: See MMT chart above Goal status: IN PROGRESS  3.  Pt will tolerate lifting 25# floor to chest with good mechanics and pain <= 2/10 Baseline:  02/03/23: 15# sit to stand and deadlifts Goal status: MET  4.  Pt will tolerate working full shift with knee pain <= 2/10 Baseline:  02/03/23: Has not been assigned more than 5 hours of work still Goal status: IN PROGRESS    PLAN:  PT FREQUENCY: 2x/week  PT DURATION: 6 weeks  PLANNED INTERVENTIONS: Therapeutic exercises, Therapeutic activity, Neuromuscular re-education, Balance training, Gait training, Patient/Family education, Self Care, Joint mobilization, Aquatic Therapy, Dry Needling, Electrical stimulation, Cryotherapy, Moist heat, Taping, Vasopneumatic device, Traction, Ultrasound, Ionotophoresis 4mg /ml Dexamethasone, Manual therapy, and Re-evaluation  PLAN FOR NEXT SESSION: standing LE and core strength, lifting mechanics   Solon Palm, PT  03/17/2023, 12:39 PM

## 2023-03-17 ENCOUNTER — Encounter: Payer: Self-pay | Admitting: Physical Therapy

## 2023-03-17 ENCOUNTER — Ambulatory Visit: Payer: Medicare Other | Admitting: Physical Therapy

## 2023-03-17 DIAGNOSIS — M6281 Muscle weakness (generalized): Secondary | ICD-10-CM | POA: Diagnosis not present

## 2023-03-17 DIAGNOSIS — M25562 Pain in left knee: Secondary | ICD-10-CM | POA: Diagnosis not present

## 2023-03-17 DIAGNOSIS — R262 Difficulty in walking, not elsewhere classified: Secondary | ICD-10-CM

## 2023-03-17 DIAGNOSIS — M25561 Pain in right knee: Secondary | ICD-10-CM | POA: Diagnosis not present

## 2023-03-17 DIAGNOSIS — G8929 Other chronic pain: Secondary | ICD-10-CM

## 2023-03-22 ENCOUNTER — Encounter: Payer: Self-pay | Admitting: Physical Therapy

## 2023-03-22 ENCOUNTER — Ambulatory Visit: Payer: Medicare Other | Admitting: Physical Therapy

## 2023-03-22 DIAGNOSIS — M25561 Pain in right knee: Secondary | ICD-10-CM | POA: Diagnosis not present

## 2023-03-22 DIAGNOSIS — R262 Difficulty in walking, not elsewhere classified: Secondary | ICD-10-CM

## 2023-03-22 DIAGNOSIS — M6281 Muscle weakness (generalized): Secondary | ICD-10-CM

## 2023-03-22 DIAGNOSIS — G8929 Other chronic pain: Secondary | ICD-10-CM

## 2023-03-22 DIAGNOSIS — M25562 Pain in left knee: Secondary | ICD-10-CM | POA: Diagnosis not present

## 2023-03-22 NOTE — Therapy (Signed)
OUTPATIENT PHYSICAL THERAPY LOWER EXTREMITY TREATMENT    Patient Name: Lindsay [name] MRN: [id number] DOB:1956/01/09, 67 y.o., female Today's Date: 03/22/2023 DATES OF SERVICE: 12/29/22-02/15/23 END OF SESSION:  PT End of Session - 03/22/23 1154     Visit Number 18    Number of Visits 24    Date for PT Re-Evaluation 03/22/23    Authorization - Visit Number 18    Progress Note Due on Visit 20    PT Start Time 1154   patient late   PT Stop Time 1229    PT Time Calculation (min) 35 min    Activity Tolerance Patient tolerated treatment well    Behavior During Therapy [facility] for tasks assessed/performed                  Past Medical History:  Diagnosis Date   Abnormal mammogram    Thick Tissue   Acquired trigger finger of right ring finger 04/20/2022   Arthritis    Bilateral wrist pain 04/20/2022   Bipolar 1 disorder    Cataract    OU   Cervical radiculopathy 10/14/2021   Chalazion of left lower eyelid 04/15/2022   Chest pain 02/11/2015   Chronic atrial fibrillation 09/08/2015   Colon polyp    Compressed cervical disc    Coronary artery disease involving native coronary artery with angina pectoris 03/24/2015   Dorsalgia 09/22/2018   Dysphagia 09/22/2018   Essential hypertension 03/03/2015   Generalized anxiety disorder    GERD without esophagitis 09/22/2018   Hematemesis 12/18/2019   Hepatic steatosis 04/22/2022   History of appendectomy 1963   Hypertensive retinopathy    OU   Irritable bowel syndrome without diarrhea 09/22/2018   Lactic acidosis 12/18/2019   LLQ pain 04/15/2022   Major depressive disorder    Migraine without status migrainosus, not intractable 09/22/2018   Mixed hyperlipidemia 05/07/2016   Non-ST elevation MI (NSTEMI) 07/26/2008   No cardiac catheterization at the time.   Obstructive sleep apnea    no CPAP use   Orthostasis    Osteoarthritis of carpometacarpal (CMC) joint of thumb 04/20/2022   Osteoarthritis of knee 09/22/2018   PAF  (paroxysmal atrial fibrillation) 10/09/2018   Pain in joint of right hip 07/03/2020   Pain in right knee 12/14/2018   Recurrent syncope    Rib pain 07/03/2020   Sciatica of right side 09/22/2018   Severe alcohol use disorder    Vitamin D deficiency    Past Surgical History:  Procedure Laterality Date   APPENDECTOMY     CARDIAC CATHETERIZATION N/A 03/25/2015   Procedure: Left Heart Cath and Coronary Angiography;  Surgeon: Marykay Lex, MD;  Location: [facility] INVASIVE CV LAB;  Service: Cardiovascular;  Laterality: N/A;   CORONARY STENT INTERVENTION N/A 10/09/2018   Procedure: CORONARY STENT INTERVENTION;  Surgeon: Corky Crafts, MD;  Location: [facility] INVASIVE CV LAB;  Service: Cardiovascular;  Laterality: N/A;   DILATATION & CURETTAGE/HYSTEROSCOPY WITH MYOSURE N/A 05/19/2016   Procedure: DILATATION & CURETTAGE/HYSTEROSCOPY WITH MYOSURE;  Surgeon: Geryl Rankins, MD;  Location: [facility];  Service: Gynecology;  Laterality: N/A;  Possible Myosure for polyps.   EYE SURGERY     LEFT HEART CATH AND CORONARY ANGIOGRAPHY N/A 10/09/2018   Procedure: LEFT HEART CATH AND CORONARY ANGIOGRAPHY;  Surgeon: Corky Crafts, MD;  Location: [facility] INVASIVE CV LAB;  Service: Cardiovascular;  Laterality: N/A;   MOUTH SURGERY     URETHRAL DILATION     Patient Active Problem List   Diagnosis Date  Noted   Viral upper respiratory tract infection 02/02/2023   Stress 02/02/2023   Insomnia 12/23/2022   Blood clotting disorder (HCC) 10/28/2022   Neck pain 10/27/2022   Acute thoracic back pain 10/27/2022   Lumbar radiculopathy 05/26/2022   Degeneration of lumbar intervertebral disc 05/26/2022   Major depressive disorder    Severe alcohol use disorder    Generalized anxiety disorder    Obstructive sleep apnea    Hepatic steatosis 04/22/2022   Osteoarthritis of carpometacarpal (CMC) joint of thumb 04/20/2022   Acquired trigger finger of right ring finger 04/20/2022   Bilateral wrist pain 04/20/2022   Chalazion  of left lower eyelid 04/15/2022   LLQ pain 04/15/2022   Bipolar 1 disorder (HCC)    Cervical radiculopathy 10/14/2021   Pain in joint of right hip 07/03/2020   Rib pain 07/03/2020   Recurrent syncope    Orthostasis    Lactic acidosis 12/18/2019   Hematemesis 12/18/2019   Pain in right knee 12/14/2018   PAF (paroxysmal atrial fibrillation) 10/09/2018   Dorsalgia 09/22/2018   Dysphagia 09/22/2018   GERD without esophagitis 09/22/2018   Irritable bowel syndrome without diarrhea 09/22/2018   Osteoarthritis of knee 09/22/2018   Polypharmacy 09/22/2018   Sciatica of right side 09/22/2018   Vitamin D deficiency 09/22/2018   Mixed hyperlipidemia 05/07/2016   Chronic atrial fibrillation (HCC) 09/08/2015   Coronary artery disease involving native coronary artery with angina pectoris 03/24/2015   Essential hypertension 03/03/2015   Chest pain 02/11/2015   Non-ST elevation MI (NSTEMI) (HCC) 07/26/2008    PCP: Tamera Punt  REFERRING PROVIDER: Tamera Punt  REFERRING DIAG: acute thoracic back pain  THERAPY DIAG:  Chronic pain of both knees  Difficulty in walking, not elsewhere classified  Muscle weakness (generalized)  Rationale for Evaluation and Treatment: Rehabilitation  ONSET DATE: 11/2022  SUBJECTIVE:   SUBJECTIVE STATEMENT: Patient reports she is late due to running errands. I'm having increased L knee pain and swelling in posterior knee and hurts down my leg to the ankle. Has been resting the past 2 days it has been hurting.  Also c/o R shoulder pain with  horizontal ABD and reaching to shelf.   PERTINENT HISTORY: Cervical DDD Coccyx fracture From eval: Pt states her knees have bothered her for years but that they have been worsening lately. She states that 2-3 weeks ago her Lt knee "went out" on her while she was standing in a line. She states she can also hear "crunching" in her knees. Pt also has sciatica down Rt LE due to a fall 3 years ago where she broke her coccyx.  She saw MD  who recommended physical therapy. Knee pain increases with prolonged standing and walking, back pain increases with lifting. Pain decreases with use of voltaren gel or meds  PAIN:  Are you having pain? Yes: NPRS scale: 3/10 L leg and ankle currently,/10 Pain location: Rt LE, bilat knees Pain description: burning, aching, sore Aggravating factors: prolonged standing and walking, lifting Relieving factors: meds  PRECAUTIONS: None  WEIGHT BEARING RESTRICTIONS: No  FALLS:  Has patient fallen in last 6 months? No  OCCUPATION: Conservation officer, nature at [employer], lots of walking, lifting and standing  PLOF: Independent  PATIENT GOALS: be able to walk for exercise, return fully to work with decreased pain  NEXT MD VISIT: July 2024  OBJECTIVE: (Measures in this section from initial evaluation unless otherwise noted)  MUSCLE LENGTH: Hamstrings: Right WFL deg; Left WFL deg  POSTURE: decreased lumbar lordosis  PALPATION: Pain  with lateral patellar mobs Lt knee Crepitus with patellar mobs bilat Pain with palpation Rt glutes near sacrum TTP with UPAs Rt SIJ  Lumbar ROM: flex WFL   Ext 75%   Rt lateral flexion 50% pain   Lt lateral flexion WFL   Rt rotation WFL pain end range   Lt rotation WFL pain end range  LOWER EXTREMITY ROM: WFL bilat  LOWER EXTREMITY MMT:  MMT Right eval Left eval Right 02/03/23 Left 02/03/23  Hip flexion 4- 4 SLR with ER 3/5 5 5   Hip extension 3+ 3+ 4+ 4+  Hip abduction 4- 4 4 4+  Hip adduction      Hip internal rotation      Hip external rotation 4 4 4+ 4+  Knee flexion 4 4 5  4+  Knee extension 4 4 5 5   Ankle dorsiflexion      Ankle plantarflexion      Ankle inversion      Ankle eversion       (Blank rows = not tested)  LOWER EXTREMITY SPECIAL TESTS:  SLR (-) bilat  GAIT: Distance walked: 100' Assistive device utilized: Single point cane Level of assistance: Modified independence Comments: antalgic gait, decreased cadence  [facility] Adult PT  Treatment:                                                DATE: 03/22/23 Therapeutic Exercise: Nustep L 3 x 6 min Prone lumbar ext x 10 (hurts shoulder) Prone elbows  - no pain in R LB but ankle pain still there Supine HS stretch with strap x 1 min B Supine quad set 5 sec hold x 10 SAQ 5 sec hold x 10 B SLR x 10 B HS set 5 sec x 10  [facility] Adult PT Treatment:                                                DATE: 03/17/23 Therapeutic Exercise: Nustep L 5 x 6 min Floor to chest lifting 17# box x 3, 22# xx 2, 27# x 2 Standing lumbar extension x 3 Supine HS stretch with strap x 1 min B Quad stretch with strap standing x30 sec B Modified plank on elbows and knees x 30 sec, then with one leg lift x 10 sec B Leg press 70# double leg 3x10   [facility] Adult PT Treatment:                                                DATE: 03/15/23 Therapeutic Exercise: Bike L 2x 6 min Standing lumbar extension x 10 Supine HS stretch with strap x 1 min B Supine SLR x 20, with ER x 10 Seated fig 4 B 2x30 sec ea Quad stretch with strap standing x30 sec B standard plank x 20 sec Modified plank on elbows and knees x 30 sec Heel raises on low step with gastroc stretch on lowering x 20 Leg press 70# double leg 3x10   [facility] Adult PT Treatment:  DATE: 03/03/23 Therapeutic Exercise: Seated hamstring stretch x30 sec Seated figure 4 stretch x 30 sec Seated hip flexor stretch x30 sec Prone quad stretch 2x30 sec Supine SLR 2x10 Manual Therapy: IASTM, STM, & TPR bilat quads, gastroc, adductors K-tape: 2 "I" strips along medial and lateral quad Patellar mobilization Self Care: How to apply k-tape to support patellar tendon    PATIENT EDUCATION:  Education details: HEP UPDATE Person educated: Patient Education method: Explanation, Demonstration, and Handouts Education comprehension: verbalized understanding and returned demonstration  HOME EXERCISE PROGRAM: Access Code:  [email or web address] URL: [email or web address] Date: 03/22/2023 Prepared by: Raynelle Fanning  Exercises - Figure 4 Bridge  - 1 x daily - 7 x weekly - 2 sets - 10 reps - Straight Leg Raise with External Rotation  - 1 x daily - 7 x weekly - 2 sets - 10 reps - Hip Extension with Resistance Loop  - 1 x daily - 7 x weekly - 3 sets - 10 reps - Diagonal Hip Extension with Resistance  - 1 x daily - 7 x weekly - 3 sets - 10 reps - Supine Hamstring Stretch with Strap  - 1 x daily - 7 x weekly - 2 sets - 30 sec hold - Supine Quadriceps Stretch with Strap on Table  - 1 x daily - 7 x weekly - 2 sets - 30 sec hold - Supine Piriformis Stretch with Foot on Ground  - 1 x daily - 7 x weekly - 2 sets - 30 sec hold - Clamshell with Resistance  - 1 x daily - 7 x weekly - 2 sets - 10 reps - Seated Piriformis Stretch with Trunk Bend  - 1 x daily - 7 x weekly - 2 sets - 30 sec hold - Seated Hamstring Stretch  - 1 x daily - 7 x weekly - 2 sets - 30 sec hold - Dead Bug  - 1 x daily - 7 x weekly - 2 sets - 10 reps - Side Stepping with Resistance at Ankles and Counter Support  - 1 x daily - 7 x weekly - 2 sets - 10 reps - Hip Extension with Single Leg Support Prone on Table Edge  - 1 x daily - 7 x weekly - 3 sets - 10 reps - Half Deadlift with Kettlebell  - 1 x daily - 7 x weekly - 3 sets - 10 reps - Sit to Stand  - 1 x daily - 7 x weekly - 3 sets - 10 reps - Supine Quad Set  - 1 x daily - 7 x weekly - 2 sets - 10 reps - 5 sec hold - Supine Isometric Hamstring Set  - 1 x daily - 7 x weekly - 2 sets - 10 reps - 5 hold - Supine Knee Extension Strengthening  - 1 x daily - 7 x weekly - 2 sets - 10 reps - 5 hold - Supine Active Straight Leg Raise  - 1 x daily - 3 x weekly - 2 sets - 10 reps  ASSESSMENT:  CLINICAL IMPRESSION: Patient presents with increased left knee, leg and ankle pain today and reports increased swelling in left knee. She was tender to palpation in the post left knee today. Due to pain we did low level knee  strengthening which she tolerated well. Advised her to do until next visit. Patient was late for appt so shortened treatment time. She needs recert next visit.    OBJECTIVE IMPAIRMENTS: decreased activity tolerance, difficulty walking, decreased strength, increased muscle  spasms, and pain.     GOALS: Goals reviewed with patient? Yes  SHORT TERM GOALS: Target date: 01/12/2023   Pt will be independent with initial HEP Baseline: Goal status: MET   LONG TERM GOALS: Target date: 03/22/2023  Pt will be independent with advanced HEP Baseline:  Goal status: IN PROGRESS  2.  Pt will improve bilat LE strength to 4+/5 to improve standing and walking tolerance Baseline:  02/03/23: See MMT chart above Goal status: IN PROGRESS  3.  Pt will tolerate lifting 25# floor to chest with good mechanics and pain <= 2/10 Baseline:  02/03/23: 15# sit to stand and deadlifts Goal status: MET  4.  Pt will tolerate working full shift with knee pain <= 2/10 Baseline:  02/03/23: Has not been assigned more than 5 hours of work still Goal status: IN PROGRESS    PLAN:  PT FREQUENCY: 2x/week  PT DURATION: 6 weeks  PLANNED INTERVENTIONS: Therapeutic exercises, Therapeutic activity, Neuromuscular re-education, Balance training, Gait training, Patient/Family education, Self Care, Joint mobilization, Aquatic Therapy, Dry Needling, Electrical stimulation, Cryotherapy, Moist heat, Taping, Vasopneumatic device, Traction, Ultrasound, Ionotophoresis 4mg /ml Dexamethasone, Manual therapy, and Re-evaluation  PLAN FOR NEXT SESSION: standing LE and core strength, lifting mechanics   Solon Palm, PT  03/22/2023, 12:39 PM

## 2023-03-23 NOTE — Therapy (Signed)
OUTPATIENT PHYSICAL THERAPY LOWER EXTREMITY TREATMENT AND RECERTIFICATION AND PROGRESS NOTE   Reporting Period 02/17/23 to 03/24/23   See note below for Objective Data and Assessment of Progress/Goals.    Patient Name: Lindsay [name] MRN: [id number] DOB:Jun 05, 1956, 67 y.o., female Today's Date: 03/24/2023 DATES OF SERVICE: 12/29/22-02/15/23 END OF SESSION:  PT End of Session - 03/24/23 1150     Visit Number 19    Number of Visits 24    Date for PT Re-Evaluation 03/24/23    Authorization Type Medicare    Authorization - Visit Number 19    Progress Note Due on Visit 29    PT Start Time 1148    PT Stop Time 1230    PT Time Calculation (min) 42 min    Activity Tolerance Patient tolerated treatment well    Behavior During Therapy [place] for tasks assessed/performed                   Past Medical History:  Diagnosis Date   Abnormal mammogram    Thick Tissue   Acquired trigger finger of right ring finger 04/20/2022   Arthritis    Bilateral wrist pain 04/20/2022   Bipolar 1 disorder    Cataract    OU   Cervical radiculopathy 10/14/2021   Chalazion of left lower eyelid 04/15/2022   Chest pain 02/11/2015   Chronic atrial fibrillation 09/08/2015   Colon polyp    Compressed cervical disc    Coronary artery disease involving native coronary artery with angina pectoris 03/24/2015   Dorsalgia 09/22/2018   Dysphagia 09/22/2018   Essential hypertension 03/03/2015   Generalized anxiety disorder    GERD without esophagitis 09/22/2018   Hematemesis 12/18/2019   Hepatic steatosis 04/22/2022   History of appendectomy 1963   Hypertensive retinopathy    OU   Irritable bowel syndrome without diarrhea 09/22/2018   Lactic acidosis 12/18/2019   LLQ pain 04/15/2022   Major depressive disorder    Migraine without status migrainosus, not intractable 09/22/2018   Mixed hyperlipidemia 05/07/2016   Non-ST elevation MI (NSTEMI) 07/26/2008   No cardiac catheterization at the time.    Obstructive sleep apnea    no CPAP use   Orthostasis    Osteoarthritis of carpometacarpal (CMC) joint of thumb 04/20/2022   Osteoarthritis of knee 09/22/2018   PAF (paroxysmal atrial fibrillation) 10/09/2018   Pain in joint of right hip 07/03/2020   Pain in right knee 12/14/2018   Recurrent syncope    Rib pain 07/03/2020   Sciatica of right side 09/22/2018   Severe alcohol use disorder    Vitamin D deficiency    Past Surgical History:  Procedure Laterality Date   APPENDECTOMY     CARDIAC CATHETERIZATION N/A 03/25/2015   Procedure: Left Heart Cath and Coronary Angiography;  Surgeon: Marykay Lex, MD;  Location: [facility] INVASIVE CV LAB;  Service: Cardiovascular;  Laterality: N/A;   CORONARY STENT INTERVENTION N/A 10/09/2018   Procedure: CORONARY STENT INTERVENTION;  Surgeon: Corky Crafts, MD;  Location: [facility] INVASIVE CV LAB;  Service: Cardiovascular;  Laterality: N/A;   DILATATION & CURETTAGE/HYSTEROSCOPY WITH MYOSURE N/A 05/19/2016   Procedure: DILATATION & CURETTAGE/HYSTEROSCOPY WITH MYOSURE;  Surgeon: Geryl Rankins, MD;  Location: [facility];  Service: Gynecology;  Laterality: N/A;  Possible Myosure for polyps.   EYE SURGERY     LEFT HEART CATH AND CORONARY ANGIOGRAPHY N/A 10/09/2018   Procedure: LEFT HEART CATH AND CORONARY ANGIOGRAPHY;  Surgeon: Corky Crafts, MD;  Location: [facility] INVASIVE CV LAB;  Service: Cardiovascular;  Laterality: N/A;   MOUTH SURGERY     URETHRAL DILATION     Patient Active Problem List   Diagnosis Date Noted   Viral upper respiratory tract infection 02/02/2023   Stress 02/02/2023   Insomnia 12/23/2022   Blood clotting disorder (HCC) 10/28/2022   Neck pain 10/27/2022   Acute thoracic back pain 10/27/2022   Lumbar radiculopathy 05/26/2022   Degeneration of lumbar intervertebral disc 05/26/2022   Major depressive disorder    Severe alcohol use disorder    Generalized anxiety disorder    Obstructive sleep apnea    Hepatic steatosis 04/22/2022    Osteoarthritis of carpometacarpal (CMC) joint of thumb 04/20/2022   Acquired trigger finger of right ring finger 04/20/2022   Bilateral wrist pain 04/20/2022   Chalazion of left lower eyelid 04/15/2022   LLQ pain 04/15/2022   Bipolar 1 disorder (HCC)    Cervical radiculopathy 10/14/2021   Pain in joint of right hip 07/03/2020   Rib pain 07/03/2020   Recurrent syncope    Orthostasis    Lactic acidosis 12/18/2019   Hematemesis 12/18/2019   Pain in right knee 12/14/2018   PAF (paroxysmal atrial fibrillation) 10/09/2018   Dorsalgia 09/22/2018   Dysphagia 09/22/2018   GERD without esophagitis 09/22/2018   Irritable bowel syndrome without diarrhea 09/22/2018   Osteoarthritis of knee 09/22/2018   Polypharmacy 09/22/2018   Sciatica of right side 09/22/2018   Vitamin D deficiency 09/22/2018   Mixed hyperlipidemia 05/07/2016   Chronic atrial fibrillation (HCC) 09/08/2015   Coronary artery disease involving native coronary artery with angina pectoris 03/24/2015   Essential hypertension 03/03/2015   Chest pain 02/11/2015   Non-ST elevation MI (NSTEMI) (HCC) 07/26/2008    PCP: Tamera Punt  REFERRING PROVIDER: Tamera Punt  REFERRING DIAG: acute thoracic back pain  THERAPY DIAG:  Chronic pain of both knees - Plan: PT plan of care cert/re-cert  Difficulty in walking, not elsewhere classified - Plan: PT plan of care cert/re-cert  Muscle weakness (generalized) - Plan: PT plan of care cert/re-cert  Rationale for Evaluation and Treatment: Rehabilitation  ONSET DATE: 11/2022  SUBJECTIVE:   SUBJECTIVE STATEMENT: Pain is better than last visit. Off Nexium since last week and drinking Kambucha and it has been good so far. The R knee has improved, but the L knee has increased. The R knee doesn't swell as much and if it does goes down faster.   PERTINENT HISTORY: Cervical DDD Coccyx fracture From eval: Pt states her knees have bothered her for years but that they have been worsening lately. She  states that 2-3 weeks ago her Lt knee "went out" on her while she was standing in a line. She states she can also hear "crunching" in her knees. Pt also has sciatica down Rt LE due to a fall 3 years ago where she broke her coccyx.  She saw MD who recommended physical therapy. Knee pain increases with prolonged standing and walking, back pain increases with lifting. Pain decreases with use of voltaren gel or meds  PAIN:  Are you having pain? Yes: NPRS scale: 3/10 L leg and ankle currently,/10 Pain location: Rt LE, bilat knees Pain description: burning, aching, sore Aggravating factors: prolonged standing and walking, lifting Relieving factors: meds  PRECAUTIONS: None  WEIGHT BEARING RESTRICTIONS: No  FALLS:  Has patient fallen in last 6 months? No  OCCUPATION: Conservation officer, nature at [employer], lots of walking, lifting and standing  PLOF: Independent  PATIENT GOALS: be able to walk for exercise, return fully  to work with decreased pain  NEXT MD VISIT: July 2024  OBJECTIVE: (Measures in this section from initial evaluation unless otherwise noted)  MUSCLE LENGTH: Hamstrings: Right WFL deg; Left WFL deg  POSTURE: decreased lumbar lordosis  PALPATION: Pain with lateral patellar mobs Lt knee Crepitus with patellar mobs bilat Pain with palpation Rt glutes near sacrum TTP with UPAs Rt SIJ  Lumbar ROM: flex WFL   Ext 75%   Rt lateral flexion 50% pain   Lt lateral flexion WFL   Rt rotation WFL pain end range   Lt rotation WFL pain end range  LOWER EXTREMITY ROM: [facility] bilat  LOWER EXTREMITY MMT:  MMT Right eval Left eval Right 02/03/23 Left 02/03/23 Right 8/29 Left  8/29  Hip flexion 4- 4 SLR with ER 3/5 5 5  4+ 4-  Hip extension 3+ 3+ 4+ 4+ 5 4+  Hip abduction 4- 4 4 4+ 4- 4  Hip adduction     5 5  Hip internal rotation        Hip external rotation 4 4 4+ 4+    Knee flexion 4 4 5  4+ 4 4  Knee extension 4 4 5 5 5  5-  Ankle dorsiflexion        Ankle plantarflexion         Ankle inversion        Ankle eversion         (Blank rows = not tested)  LOWER EXTREMITY SPECIAL TESTS:  SLR (-) bilat  GAIT: Distance walked: 100' Assistive device utilized: Single point cane Level of assistance: Modified independence Comments: antalgic gait, decreased cadence  [facility] Adult PT Treatment:                                                DATE: 03/24/23  Therapeutic Activities: Goals assessed/MMT  Therapeutic Exercise: Nustep L 5 x 6 min Prone hip ext  x 5 ea, then bent over mat table x 15 ea SLR with ER x 10 B Supine quad set 5 sec hold x 10 SAQ 5 sec hold x 10 B HS set 5 sec x 10 B Dead bug 2 x 5 Bil - needs cues for correct form   [facility] Adult PT Treatment:                                                DATE: 03/22/23 Therapeutic Exercise: Nustep L 3 x 6 min Prone lumbar ext x 10 (hurts shoulder) Prone elbows  - no pain in R LB but ankle pain still there Supine HS stretch with strap x 1 min B Supine quad set 5 sec hold x 10 SAQ 5 sec hold x 10 B SLR x 10 B HS set 5 sec x 10  [facility] Adult PT Treatment:                                                DATE: 03/17/23 Therapeutic Exercise: Nustep L 5 x 6 min Floor to chest lifting 17# box x 3, 22# xx 2, 27# x 2 Standing lumbar extension x  3 Supine HS stretch with strap x 1 min B Quad stretch with strap standing x30 sec B Modified plank on elbows and knees x 30 sec, then with one leg lift x 10 sec B Leg press 70# double leg 3x10   [facility] Adult PT Treatment:                                                DATE: 03/15/23 Therapeutic Exercise: Bike L 2x 6 min Standing lumbar extension x 10 Supine HS stretch with strap x 1 min B Supine SLR x 20, with ER x 10 Seated fig 4 B 2x30 sec ea Quad stretch with strap standing x30 sec B standard plank x 20 sec Modified plank on elbows and knees x 30 sec Heel raises on low step with gastroc stretch on lowering x 20 Leg press 70# double leg 3x10   [facility] Adult PT Treatment:                                                 DATE: 03/03/23 Therapeutic Exercise: Seated hamstring stretch x30 sec Seated figure 4 stretch x 30 sec Seated hip flexor stretch x30 sec Prone quad stretch 2x30 sec Supine SLR 2x10 Manual Therapy: IASTM, STM, & TPR bilat quads, gastroc, adductors K-tape: 2 "I" strips along medial and lateral quad Patellar mobilization Self Care: How to apply k-tape to support patellar tendon    PATIENT EDUCATION:  Education details: HEP UPDATE Person educated: Patient Education method: Explanation, Demonstration, and Handouts Education comprehension: verbalized understanding and returned demonstration  HOME EXERCISE PROGRAM: Access Code: [email or web address] URL: [email or web address] Date: 03/22/2023 Prepared by: Raynelle Fanning  Exercises - Figure 4 Bridge  - 1 x daily - 7 x weekly - 2 sets - 10 reps - Straight Leg Raise with External Rotation  - 1 x daily - 7 x weekly - 2 sets - 10 reps - Hip Extension with Resistance Loop  - 1 x daily - 7 x weekly - 3 sets - 10 reps - Diagonal Hip Extension with Resistance  - 1 x daily - 7 x weekly - 3 sets - 10 reps - Supine Hamstring Stretch with Strap  - 1 x daily - 7 x weekly - 2 sets - 30 sec hold - Supine Quadriceps Stretch with Strap on Table  - 1 x daily - 7 x weekly - 2 sets - 30 sec hold - Supine Piriformis Stretch with Foot on Ground  - 1 x daily - 7 x weekly - 2 sets - 30 sec hold - Clamshell with Resistance  - 1 x daily - 7 x weekly - 2 sets - 10 reps - Seated Piriformis Stretch with Trunk Bend  - 1 x daily - 7 x weekly - 2 sets - 30 sec hold - Seated Hamstring Stretch  - 1 x daily - 7 x weekly - 2 sets - 30 sec hold - Dead Bug  - 1 x daily - 7 x weekly - 2 sets - 10 reps - Side Stepping with Resistance at Ankles and Counter Support  - 1 x daily - 7 x weekly - 2 sets - 10 reps - Hip Extension with Single Leg Support Prone on Table Edge  -  1 x daily - 7 x weekly - 3 sets - 10 reps - Half Deadlift with  Kettlebell  - 1 x daily - 7 x weekly - 3 sets - 10 reps - Sit to Stand  - 1 x daily - 7 x weekly - 3 sets - 10 reps - Supine Quad Set  - 1 x daily - 7 x weekly - 2 sets - 10 reps - 5 sec hold - Supine Isometric Hamstring Set  - 1 x daily - 7 x weekly - 2 sets - 10 reps - 5 hold - Supine Knee Extension Strengthening  - 1 x daily - 7 x weekly - 2 sets - 10 reps - 5 hold - Supine Active Straight Leg Raise  - 1 x daily - 3 x weekly - 2 sets - 10 reps  ASSESSMENT:  CLINICAL IMPRESSION: Adalae presents with ongoing B knee pain. Pain persists in proximal lower leg on R. On the L it is in the patella and behind the knee. She reports increased swelling in the R knee, but says the L knee has improved with this. MMT today shows that she has actually lost strength since her last assessment. In discussion with patient, she revealed that she is not fully compliant with her HEP and that she is also limited due to R knee pain. She has a lot of exercises which we reviewed today. She wanted to keep them all as some standing TE she can do when the knees feel okay and otherwise NWB is a better option. She has met her lifting goal, but her other LTG's have not been met. At this time patient is being referred back to her MD to address her knee pain. Without this being managed it is unlikely that further PT will benefit her. At this time I will re-certify for today's visit and await MD orders.    OBJECTIVE IMPAIRMENTS: decreased activity tolerance, difficulty walking, decreased strength, increased muscle spasms, and pain.     GOALS: Goals reviewed with patient? Yes  SHORT TERM GOALS: Target date: 01/12/2023   Pt will be independent with initial HEP Baseline: Goal status: MET   LONG TERM GOALS: Target date: 03/22/2023 extended to 03/24/23  Pt will be independent with advanced HEP Baseline:  Goal status: IN PROGRESS 8/29/24Pt not fully compliant with HEP  2.  Pt will improve bilat LE strength to 4+/5 to improve  standing and walking tolerance Baseline:  02/03/23: See MMT chart above Goal status: IN PROGRESS 03/24/23  3.  Pt will tolerate lifting 25# floor to chest with good mechanics and pain <= 2/10 Baseline:  02/03/23: 15# sit to stand and deadlifts Goal status: MET  4.  Pt will tolerate working full shift with knee pain <= 2/10 Baseline:  02/03/23: Has not been assigned more than 5 hours of work still Goal status: IN PROGRESS 03/24/23 6-7/10 intermittent pain; avg pain 5-6/10     PLAN:  PT FREQUENCY: 2x/week  PT DURATION: 6 weeks  PLANNED INTERVENTIONS: Therapeutic exercises, Therapeutic activity, Neuromuscular re-education, Balance training, Gait training, Patient/Family education, Self Care, Joint mobilization, Aquatic Therapy, Dry Needling, Electrical stimulation, Cryotherapy, Moist heat, Taping, Vasopneumatic device, Traction, Ultrasound, Ionotophoresis 4mg /ml Dexamethasone, Manual therapy, and Re-evaluation  PLAN FOR NEXT SESSION: Await MD orders (see clinical impression)   Solon Palm, PT  03/24/2023, 12:57 PM

## 2023-03-24 ENCOUNTER — Ambulatory Visit: Payer: Medicare Other | Admitting: Physical Therapy

## 2023-03-24 ENCOUNTER — Encounter: Payer: Self-pay | Admitting: Physical Therapy

## 2023-03-24 DIAGNOSIS — M25561 Pain in right knee: Secondary | ICD-10-CM | POA: Diagnosis not present

## 2023-03-24 DIAGNOSIS — M6281 Muscle weakness (generalized): Secondary | ICD-10-CM

## 2023-03-24 DIAGNOSIS — M25562 Pain in left knee: Secondary | ICD-10-CM | POA: Diagnosis not present

## 2023-03-24 DIAGNOSIS — G8929 Other chronic pain: Secondary | ICD-10-CM | POA: Diagnosis not present

## 2023-03-24 DIAGNOSIS — R262 Difficulty in walking, not elsewhere classified: Secondary | ICD-10-CM | POA: Diagnosis not present

## 2023-04-01 ENCOUNTER — Other Ambulatory Visit: Payer: Self-pay

## 2023-04-01 DIAGNOSIS — I10 Essential (primary) hypertension: Secondary | ICD-10-CM

## 2023-04-01 MED ORDER — LOSARTAN POTASSIUM 50 MG PO TABS
50.0000 mg | ORAL_TABLET | Freq: Every day | ORAL | 2 refills | Status: DC
Start: 2023-04-01 — End: 2024-02-03

## 2023-04-04 ENCOUNTER — Other Ambulatory Visit: Payer: Self-pay | Admitting: Family Medicine

## 2023-04-04 DIAGNOSIS — I10 Essential (primary) hypertension: Secondary | ICD-10-CM

## 2023-04-07 DIAGNOSIS — E785 Hyperlipidemia, unspecified: Secondary | ICD-10-CM | POA: Diagnosis not present

## 2023-04-07 DIAGNOSIS — E782 Mixed hyperlipidemia: Secondary | ICD-10-CM | POA: Diagnosis not present

## 2023-04-07 DIAGNOSIS — M170 Bilateral primary osteoarthritis of knee: Secondary | ICD-10-CM | POA: Diagnosis not present

## 2023-04-09 ENCOUNTER — Encounter: Payer: Self-pay | Admitting: Emergency Medicine

## 2023-04-09 ENCOUNTER — Ambulatory Visit
Admission: EM | Admit: 2023-04-09 | Discharge: 2023-04-09 | Disposition: A | Discharge status: Home / Self Care | Payer: Medicare Other | Attending: Family Medicine | Admitting: Family Medicine

## 2023-04-09 DIAGNOSIS — R112 Nausea with vomiting, unspecified: Secondary | ICD-10-CM

## 2023-04-09 DIAGNOSIS — K2100 Gastro-esophageal reflux disease with esophagitis, without bleeding: Secondary | ICD-10-CM

## 2023-04-09 DIAGNOSIS — F1020 Alcohol dependence, uncomplicated: Secondary | ICD-10-CM

## 2023-04-09 NOTE — ED Provider Notes (Addendum)
Lindsay [name] C[facility]    CSN: [id number] Arrival date & time: 04/09/23  1453      History   Chief Complaint Chief Complaint  Patient presents with   Emesis    HPI Lindsay [name] is a 67 y.o. female.   HPI  Lindsay [name] is a pleasant 67 year old woman known to me from prior visits.  She is here today with vomiting.  She has been unable to work this weekend and needs a note excuse.  She has had some intermittent vomiting, intermittent blood in emesis. Patient openly states "I am an alcoholic" admits she has fallen off the wagon.  She has been drinking alcohol.  She previously had quit for some time, but now is actively drinking again.  She states she is attending AA.  She states she has obtained a sponsor.  She states she is trying to quit Patient states she decided to go off of her Nexium.  She decided that it could interfere with her bones and absorption of nutrients.  She quit suddenly and has rebound acid problems.  Heartburn.  Vomiting.  Decreased appetite.  We had a long discussion regarding tapering Nexium gradually over time in order to prevent the rebound, adding Pepcid to help with the symptoms, liberal use of antacids.  We discussed diet, eating at night, elevating head of bed.  Past Medical History:  Diagnosis Date   Abnormal mammogram    Thick Tissue   Acquired trigger finger of right ring finger 04/20/2022   Arthritis    Bilateral wrist pain 04/20/2022   Bipolar 1 disorder    Cataract    OU   Cervical radiculopathy 10/14/2021   Chalazion of left lower eyelid 04/15/2022   Chest pain 02/11/2015   Chronic atrial fibrillation 09/08/2015   Colon polyp    Compressed cervical disc    Coronary artery disease involving native coronary artery with angina pectoris 03/24/2015   Dorsalgia 09/22/2018   Dysphagia 09/22/2018   Essential hypertension 03/03/2015   Generalized anxiety disorder    GERD without esophagitis 09/22/2018   Hematemesis 12/18/2019   Hepatic steatosis  04/22/2022   History of appendectomy 1963   Hypertensive retinopathy    OU   Irritable bowel syndrome without diarrhea 09/22/2018   Lactic acidosis 12/18/2019   LLQ pain 04/15/2022   Major depressive disorder    Migraine without status migrainosus, not intractable 09/22/2018   Mixed hyperlipidemia 05/07/2016   Non-ST elevation MI (NSTEMI) 07/26/2008   No cardiac catheterization at the time.   Obstructive sleep apnea    no CPAP use   Orthostasis    Osteoarthritis of carpometacarpal (CMC) joint of thumb 04/20/2022   Osteoarthritis of knee 09/22/2018   PAF (paroxysmal atrial fibrillation) 10/09/2018   Pain in joint of right hip 07/03/2020   Pain in right knee 12/14/2018   Recurrent syncope    Rib pain 07/03/2020   Sciatica of right side 09/22/2018   Severe alcohol use disorder    Vitamin D deficiency     Patient Active Problem List   Diagnosis Date Noted   Stress 02/02/2023   Insomnia 12/23/2022   Blood clotting disorder (HCC) 10/28/2022   Neck pain 10/27/2022   Acute thoracic back pain 10/27/2022   Lumbar radiculopathy 05/26/2022   Degeneration of lumbar intervertebral disc 05/26/2022   Major depressive disorder    Severe alcohol use disorder    Generalized anxiety disorder    Obstructive sleep apnea    Hepatic steatosis 04/22/2022   Osteoarthritis of carpometacarpal (  CMC) joint of thumb 04/20/2022   Acquired trigger finger of right ring finger 04/20/2022   Bilateral wrist pain 04/20/2022   Chalazion of left lower eyelid 04/15/2022   Bipolar 1 disorder (HCC)    Cervical radiculopathy 10/14/2021   Pain in joint of right hip 07/03/2020   Rib pain 07/03/2020   Recurrent syncope    Orthostasis    Lactic acidosis 12/18/2019   Hematemesis 12/18/2019   Pain in right knee 12/14/2018   PAF (paroxysmal atrial fibrillation) 10/09/2018   Dorsalgia 09/22/2018   Dysphagia 09/22/2018   GERD without esophagitis 09/22/2018   Irritable bowel syndrome without diarrhea 09/22/2018    Osteoarthritis of knee 09/22/2018   Polypharmacy 09/22/2018   Sciatica of right side 09/22/2018   Vitamin D deficiency 09/22/2018   Mixed hyperlipidemia 05/07/2016   Chronic atrial fibrillation (HCC) 09/08/2015   Coronary artery disease involving native coronary artery with angina pectoris 03/24/2015   Essential hypertension 03/03/2015   Chest pain 02/11/2015   Non-ST elevation MI (NSTEMI) (HCC) 07/26/2008    Past Surgical History:  Procedure Laterality Date   APPENDECTOMY     CARDIAC CATHETERIZATION N/A 03/25/2015   Procedure: Left Heart Cath and Coronary Angiography;  Surgeon: Marykay Lex, MD;  [facility];  Service: Cardiovascular;  Laterality: N/A;   CORONARY STENT INTERVENTION N/A 10/09/2018   Procedure: CORONARY STENT INTERVENTION;  Surgeon: Corky Crafts, MD;  Location: [facility] INVASIVE CV LAB;  Service: Cardiovascular;  Laterality: N/A;   DILATATION & CURETTAGE/HYSTEROSCOPY WITH MYOSURE N/A 05/19/2016   Procedure: DILATATION & CURETTAGE/HYSTEROSCOPY WITH MYOSURE;  Surgeon: Geryl Rankins, MD;  Location: [facility] ORS;  Service: Gynecology;  Laterality: N/A;  Possible Myosure for polyps.   EYE SURGERY     LEFT HEART CATH AND CORONARY ANGIOGRAPHY N/A 10/09/2018   Procedure: LEFT HEART CATH AND CORONARY ANGIOGRAPHY;  Surgeon: Corky Crafts, MD;  Location: [facility] INVASIVE CV LAB;  Service: Cardiovascular;  Laterality: N/A;   MOUTH SURGERY     URETHRAL DILATION      OB History   No obstetric history on file.      Home Medications    Prior to Admission medications   Medication Sig Start Date End Date Taking? Authorizing Provider  acetaminophen (TYLENOL) 325 MG tablet Take 2 tablets (650 mg total) by mouth every 6 (six) hours as needed for mild pain (or Fever >/= 101). 12/19/19  Yes Rodolph Bong, MD  amLODipine (NORVASC) 2.5 MG tablet Take 1 tablet (2.5 mg total) by mouth daily. 11/09/22  Yes Chilton Si, MD  azelastine (OPTIVAR) 0.05 % ophthalmic  solution 1 drop 2 (two) times daily.   Yes [provider]  B Complex-C (SUPER B COMPLEX PO) Take by mouth.   Yes [provider]  BIOTIN PO Take 1 tablet by mouth daily.   Yes [provider]  buPROPion ER (WELLBUTRIN SR) 100 MG 12 hr tablet Take 1 tablet (100 mg total) by mouth 2 (two) times daily. 02/01/23  Yes Thresa Ross, MD  Cholecalciferol 20 MCG (800 UNIT) TABS Take 1 tablet by mouth daily. 10/28/22  Yes Morey Hummingbird S, DO  clopidogrel (PLAVIX) 75 MG tablet Take 1 tablet (75 mg total) by mouth daily. 02/15/23  Yes Chilton Si, MD  Cyanocobalamin (B-12 PO) Take 1 tablet by mouth daily.   Yes [provider]  diclofenac Sodium (VOLTAREN) 1 % GEL Apply topically as needed.   Yes [provider]  esomeprazole (NEXIUM 24HR CLEAR MINIS) 20 MG  capsule Take 1 capsule (20 mg total) by mouth daily before breakfast. 12/19/19  Yes Rodolph Bong, MD  Evolocumab (REPATHA SURECLICK) 140 MG/ML SOAJ Inject 140 mg into the skin every 14 (fourteen) days. 03/31/22  Yes Chilton Si, MD  gabapentin (NEURONTIN) 100 MG capsule Take 3 capsules by mouth at bedtime. 01/30/21  Yes [provider]  HYDROcodone-acetaminophen (NORCO/VICODIN) 5-325 MG tablet Take 2 tablets by mouth as needed for moderate pain.   Yes [provider]  losartan (COZAAR) 50 MG tablet Take 1 tablet (50 mg total) by mouth daily. 04/01/23  Yes Morey Hummingbird S, DO  metoprolol tartrate (LOPRESSOR) 25 MG tablet TAKE ONE-HALF TABLET BY MOUTH TWICE A DAY 02/07/23  Yes Hilty, Lisette Abu, MD  nitroGLYCERIN (NITROSTAT) 0.4 MG SL tablet Place 1 tablet (0.4 mg total) under the tongue as needed for chest pain. 11/17/21  Yes Alver Sorrow, NP  Omega-3 Fatty Acids (OMEGA 3 PO) Take 1 capsule by mouth daily.   Yes [provider]  Plant Sterols and Stanols (CHOLESTOFF PLUS PO) Take by mouth daily.   Yes [provider]  Probiotic Product (PROBIOTIC ADVANCED PO) Take by  mouth daily.   Yes [provider]  tiZANidine (ZANAFLEX) 4 MG capsule Take 4 mg by mouth 3 (three) times daily.    [provider]  traZODone (DESYREL) 50 MG tablet Take 25 mg by mouth at bedtime.    [provider]    Family History Family History  Problem Relation Age of Onset   Vascular Disease Mother    Dementia Mother    Hypertension Mother    Stroke Mother    Depression Mother    Dementia Father    Heart attack Father    Stroke Father    Heart attack Maternal Grandmother    Heart attack Maternal Grandfather    Kidney failure Paternal Grandmother    Heart attack Paternal Grandfather     Social History Social History   Tobacco Use   Smoking status: Never   Smokeless tobacco: Never  Vaping Use   Vaping status: Never Used  Substance Use Topics   Alcohol use: Yes    Comment: hx of severe alcohol abuse; most recent admission 02/2022   Drug use: Never     Allergies   Sulfa antibiotics, Ciprofloxacin, Depakote [divalproex sodium], Pine, Risperidone, Seroquel [quetiapine fumarate], Statins, Tramadol, Citalopram, Lamotrigine, Other, Topiramate, Wasp venom, and Zetia [ezetimibe]   Review of Systems Review of Systems See HPI  Physical Exam Triage Vital Signs ED Triage Vitals  Encounter Vitals Group     BP 04/09/23 1513 107/72     Systolic BP Percentile --      Diastolic BP Percentile --      Pulse Rate 04/09/23 1513 73     Resp 04/09/23 1513 18     Temp 04/09/23 1513 98.2 F (36.8 C)     Temp Source 04/09/23 1513 Oral     SpO2 04/09/23 1513 96 %     Weight --      Height --      Head Circumference --      Peak Flow --      Pain Score 04/09/23 1516 0     Pain Loc --      Pain Education --      Exclude from Growth Chart --    No data found.  Updated Vital Signs BP 107/72 (BP Location: Right Arm)   Pulse 73   Temp  98.2 F (36.8 C) (Oral)   Resp 18   SpO2 96%      Physical Exam Constitutional:      General: She is not in  acute distress.    Appearance: She is well-developed.  HENT:     Head: Normocephalic and atraumatic.     Mouth/Throat:     Mouth: Mucous membranes are moist.  Eyes:     Conjunctiva/sclera: Conjunctivae normal.     Pupils: Pupils are equal, round, and reactive to light.  Cardiovascular:     Rate and Rhythm: Normal rate.  Pulmonary:     Effort: Pulmonary effort is normal. No respiratory distress.  Abdominal:     General: There is no distension.     Palpations: Abdomen is soft.     Tenderness: There is no abdominal tenderness.  Musculoskeletal:        General: Normal range of motion.     Cervical back: Normal range of motion.  Skin:    General: Skin is warm and dry.  Neurological:     Mental Status: She is alert.      UC Treatments / Results  Labs (all labs ordered are listed, but only abnormal results are displayed) Labs Reviewed - No data to display  EKG   Radiology No results found.  Procedures Procedures (including critical care time)  Medications Ordered in UC Medications - No data to display  Initial Impression / Assessment and Plan / UC Course  I have reviewed the triage vital signs and the nursing notes.  Pertinent labs & imaging results that were available during my care of the patient were reviewed by me and considered in my medical decision making (see chart for details).     See HPI Final Clinical Impressions(s) / UC Diagnoses   Final diagnoses:  Nausea and vomiting, unspecified vomiting type  Gastroesophageal reflux disease with esophagitis, unspecified whether hemorrhage  Severe alcohol use disorder     Discharge Instructions      You need to gradually stop the nexium Add in pepcid while you are going through tis process.  You can stop it more easily later Take antacids as needed  You are doing great!    ED Prescriptions   None    PDMP not reviewed this encounter.   Eustace Moore, MD 04/09/23 1600    Eustace Moore,  MD 04/09/23 [phone]

## 2023-04-09 NOTE — Discharge Instructions (Signed)
You need to gradually stop the nexium Add in pepcid while you are going through tis process.  You can stop it more easily later Take antacids as needed  You are doing great!

## 2023-04-09 NOTE — ED Triage Notes (Signed)
Patient c/o vomiting blood x 2 weeks intermittently currently coming off of her Nexium.  Currently treated it with baking soda and water.  Also patient states that she is an alcoholic and she fell off this week.  She's in AA and only works weekends.  She is requesting an out of work note for missing this weekend.  Patient states she's in meeting and has a sponsor.  Has not followed up with her PCP.

## 2023-04-12 ENCOUNTER — Ambulatory Visit (INDEPENDENT_AMBULATORY_CARE_PROVIDER_SITE_OTHER): Payer: Medicare Other | Admitting: Licensed Clinical Social Worker

## 2023-04-12 DIAGNOSIS — F1090 Alcohol use, unspecified, uncomplicated: Secondary | ICD-10-CM | POA: Diagnosis not present

## 2023-04-12 DIAGNOSIS — F0630 Mood disorder due to known physiological condition, unspecified: Secondary | ICD-10-CM | POA: Diagnosis not present

## 2023-04-12 DIAGNOSIS — F411 Generalized anxiety disorder: Secondary | ICD-10-CM

## 2023-04-12 DIAGNOSIS — Z789 Other specified health status: Secondary | ICD-10-CM
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
## 2023-04-14 DIAGNOSIS — M25511 Pain in right shoulder: Secondary | ICD-10-CM | POA: Diagnosis not present

## 2023-04-14 DIAGNOSIS — M170 Bilateral primary osteoarthritis of knee: Secondary | ICD-10-CM | POA: Diagnosis not present

## 2023-04-18 ENCOUNTER — Other Ambulatory Visit: Payer: Self-pay | Admitting: Family Medicine

## 2023-04-18 DIAGNOSIS — M542 Cervicalgia: Secondary | ICD-10-CM

## 2023-04-18 DIAGNOSIS — M546 Pain in thoracic spine: Secondary | ICD-10-CM

## 2023-04-19 ENCOUNTER — Telehealth (HOSPITAL_BASED_OUTPATIENT_CLINIC_OR_DEPARTMENT_OTHER): Payer: Self-pay | Admitting: Cardiovascular Disease

## 2023-04-19 NOTE — Telephone Encounter (Signed)
*  STAT* If patient is at the pharmacy, call can be transferred to refill team.   1. Which medications need to be refilled? (please list name of each medication and dose if known)   Evolocumab (REPATHA SURECLICK) 140 MG/ML SOAJ   2. Would you like to learn more about the convenience, safety, & potential cost savings by using the [facility]?   3. Are you open to using the [facility] (Type [facility]. ).  4. Which pharmacy/location (including street and city if local pharmacy) is medication to be sent to?  [facility] [address] - [place], [place] - [address]   5. Do they need a 30 day or 90 day supply?   90 day  Patient stated she is completely out of this medication.  Patient has appointment scheduled on 12/23 and noted she had been getting this medication on a grant.

## 2023-04-19 NOTE — Telephone Encounter (Signed)
Hilty pt, please assist with grant

## 2023-04-20 NOTE — Telephone Encounter (Signed)
MyChart message sent to patient w/info on [employer] as [facility] is closed right now

## 2023-04-21 ENCOUNTER — Other Ambulatory Visit: Payer: Self-pay | Admitting: Pharmacist

## 2023-04-21 DIAGNOSIS — I25119 Atherosclerotic heart disease of native coronary artery with unspecified angina pectoris: Secondary | ICD-10-CM

## 2023-04-21 DIAGNOSIS — E785 Hyperlipidemia, unspecified: Secondary | ICD-10-CM

## 2023-04-21 DIAGNOSIS — M170 Bilateral primary osteoarthritis of knee: Secondary | ICD-10-CM | POA: Diagnosis not present

## 2023-04-21 MED ORDER — REPATHA SURECLICK 140 MG/ML ~~LOC~~ SOAJ
140.0000 mg | SUBCUTANEOUS | 5 refills | Status: DC
Start: 2023-04-21 — End: 2023-11-07

## 2023-04-26 ENCOUNTER — Ambulatory Visit (INDEPENDENT_AMBULATORY_CARE_PROVIDER_SITE_OTHER): Payer: Medicare Other | Admitting: Licensed Clinical Social Worker

## 2023-04-26 ENCOUNTER — Encounter (HOSPITAL_COMMUNITY): Payer: Self-pay

## 2023-04-26 DIAGNOSIS — F0630 Mood disorder due to known physiological condition, unspecified: Secondary | ICD-10-CM

## 2023-04-26 DIAGNOSIS — F1020 Alcohol dependence, uncomplicated: Secondary | ICD-10-CM

## 2023-04-26 DIAGNOSIS — F411 Generalized anxiety disorder: Secondary | ICD-10-CM | POA: Diagnosis not present
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
## 2023-05-03 ENCOUNTER — Encounter (HOSPITAL_COMMUNITY): Payer: Self-pay | Admitting: Psychiatry

## 2023-05-03 ENCOUNTER — Ambulatory Visit (INDEPENDENT_AMBULATORY_CARE_PROVIDER_SITE_OTHER): Payer: Medicare Other | Admitting: Psychiatry

## 2023-05-03 VITALS — BP 118/76 | HR 76 | Ht 62.0 in | Wt 170.0 lb

## 2023-05-03 DIAGNOSIS — F0630 Mood disorder due to known physiological condition, unspecified: Secondary | ICD-10-CM

## 2023-05-03 DIAGNOSIS — F411 Generalized anxiety disorder: Secondary | ICD-10-CM

## 2023-05-03 DIAGNOSIS — F1020 Alcohol dependence, uncomplicated: Secondary | ICD-10-CM

## 2023-05-03 MED ORDER — TRAZODONE HCL 50 MG PO TABS: 25.0000 mg | ORAL_TABLET | Freq: Every day | ORAL | 1 refills | Status: DC

## 2023-05-03 MED ORDER — BUPROPION HCL ER (SR) 100 MG PO TB12: 100.0000 mg | ORAL_TABLET | Freq: Two times a day (BID) | ORAL | 1 refills | Status: DC

## 2023-05-03 MED ORDER — GABAPENTIN 100 MG PO CAPS: 100.0000 mg | ORAL_CAPSULE | Freq: Two times a day (BID) | ORAL | 1 refills | Status: DC

## 2023-05-03 NOTE — Progress Notes (Signed)
[facility] Follow up visit  Patient Identification: Lindsay [name] MRN:  [id number] Date of Evaluation:  05/03/2023 Referral Source: primary care Chief Complaint:   Chief Complaint  Patient presents with   Follow-up  Depression, anxiety , alcohol use Visit Diagnosis:    ICD-10-CM   1. Mood disorder in conditions classified elsewhere  F06.30     2. Generalized anxiety disorder  F41.1     3. Severe alcohol use disorder  F10.20       History of Present Illness: Patient is a 67 years old currently single Caucasian female initailly  referred by primary care physician to establish care for history of bipolar, depression and anxiety.  Patient has been in treatment many years ago but has not been taking any medication for more than 10 years and regarding to her bipolar or depression  Doing fair with wellbutrin  Not drinking and follows with AA Anxiety manageable, motivation better  Sleep fair on trazadone   She has had multiple admission in the past for alcohol-related detox and depression.    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    Consequences of Substance Abuse: Medical Consequences:  discussed effect of alcohol to depression, judjement  Past Medical History:  Past Medical History:  Diagnosis Date   Abnormal mammogram    Thick Tissue   Acquired trigger finger of right ring finger 04/20/2022   Arthritis    Bilateral wrist pain 04/20/2022   Bipolar 1 disorder    Cataract    OU   Cervical radiculopathy 10/14/2021   Chalazion  of left lower eyelid 04/15/2022   Chest pain 02/11/2015   Chronic atrial fibrillation 09/08/2015   Colon polyp    Compressed cervical disc    Coronary artery disease involving native coronary artery with angina pectoris 03/24/2015   Dorsalgia 09/22/2018   Dysphagia 09/22/2018   Essential hypertension 03/03/2015   Generalized anxiety disorder    GERD without esophagitis 09/22/2018   Hematemesis 12/18/2019   Hepatic steatosis 04/22/2022   History of appendectomy 1963   Hypertensive retinopathy    OU   Irritable bowel syndrome without diarrhea 09/22/2018   Lactic acidosis 12/18/2019   LLQ pain 04/15/2022   Major depressive disorder    Migraine without status migrainosus, not intractable 09/22/2018   Mixed hyperlipidemia 05/07/2016   Non-ST elevation MI (NSTEMI) 07/26/2008   No cardiac catheterization at the time.   Obstructive sleep apnea    no CPAP use   Orthostasis    Osteoarthritis of carpometacarpal (CMC) joint of thumb 04/20/2022   Osteoarthritis of knee 09/22/2018   PAF (paroxysmal atrial fibrillation) 10/09/2018   Pain in joint of right hip 07/03/2020   Pain in right knee 12/14/2018   Recurrent syncope    Rib pain 07/03/2020   Sciatica of right side 09/22/2018   Severe alcohol use disorder    Vitamin D deficiency     Past Surgical History:  Procedure Laterality Date   APPENDECTOMY     CARDIAC CATHETERIZATION N/A 03/25/2015   Procedure: Left Heart Cath and Coronary Angiography;  Surgeon: Onalee Hua  Starr Lake, MD;  Location: [facility] INVASIVE CV LAB;  Service: Cardiovascular;  Laterality: N/A;   CORONARY STENT INTERVENTION N/A 10/09/2018   Procedure: CORONARY STENT INTERVENTION;  Surgeon: Corky Crafts, MD;  Location: [facility] INVASIVE CV LAB;  Service: Cardiovascular;  Laterality: N/A;   DILATATION & CURETTAGE/HYSTEROSCOPY WITH MYOSURE N/A 05/19/2016   Procedure: DILATATION & CURETTAGE/HYSTEROSCOPY WITH MYOSURE;  Surgeon: Geryl Rankins, MD;  Location: [facility] ORS;  Service:  Gynecology;  Laterality: N/A;  Possible Myosure for polyps.   EYE SURGERY     LEFT HEART CATH AND CORONARY ANGIOGRAPHY N/A 10/09/2018   Procedure: LEFT HEART CATH AND CORONARY ANGIOGRAPHY;  Surgeon: Corky Crafts, MD;  Location: [facility] INVASIVE CV LAB;  Service: Cardiovascular;  Laterality: N/A;   MOUTH SURGERY     URETHRAL DILATION      Family Psychiatric History: cousin some mental illness, father possible depression   Family History:  Family History  Problem Relation Age of Onset   Vascular Disease Mother    Dementia Mother    Hypertension Mother    Stroke Mother    Depression Mother    Dementia Father    Heart attack Father    Stroke Father    Heart attack Maternal Grandmother    Heart attack Maternal Grandfather    Kidney failure Paternal Grandmother    Heart attack Paternal Grandfather     Social History:   Social History   Socioeconomic History   Marital status: Single    Spouse name: Not on file   Number of children: 0   Years of education: 16   Highest education level: Bachelor's degree (e.g., BA, AB, BS)  Occupational History   Not on file  Tobacco Use   Smoking status: Never   Smokeless tobacco: Never  Vaping Use   Vaping status: Never Used  Substance and Sexual Activity   Alcohol use: Yes    Comment: hx of severe alcohol abuse; most recent admission 02/2022   Drug use: Never   Sexual activity: Not Currently  Other Topics Concern   Not on file  Social History Narrative   Right handed   Drinks caffeine   Split level   Social Determinants of Health   Financial Resource Strain: High Risk (10/26/2022)   Overall Financial Resource Strain (CARDIA)    Difficulty of Paying Living Expenses: Hard  Food Insecurity: Food Insecurity Present (10/26/2022)   Hunger Vital Sign    Worried About Running Out of Food in the Last Year: Often true    Ran Out of Food in the Last Year: Sometimes true  Transportation Needs: Unmet Transportation Needs (10/26/2022)    PRAPARE - Transportation    Lack of Transportation (Medical): Yes    Lack of Transportation (Non-Medical): Yes  Physical Activity: Unknown (10/26/2022)   Exercise Vital Sign    Days of Exercise per Week: 0 days    Minutes of Exercise per Session: Not on file  Stress: Stress Concern Present (10/26/2022)   [employer] of Occupational Health - Occupational Stress Questionnaire    Feeling of Stress : Very much  Social Connections: Socially Isolated (10/26/2022)   Social Connection and Isolation Panel [NHANES]    Frequency of Communication with Friends and Family: More than three times a week    Frequency of Social Gatherings with Friends and Family: Once a week    Attends Religious Services: Never    Database administrator or Organizations: No    Attends Banker Meetings: Not on  file    Marital Status: Never married    Additional Social History: grew up with parents, rough growing up or challenging with some conflicts with parents  Allergies:   Allergies  Allergen Reactions   Sulfa Antibiotics Swelling and Rash   Ciprofloxacin Nausea And Vomiting   Depakote [Divalproex Sodium] Other (See Comments)    Reaction not recalled- "it was 20 years ago"   Pine Itching   Risperidone Other (See Comments)    Unknown reaction   Seroquel [Quetiapine Fumarate] Other (See Comments)    VIVID nightmares   Statins Other (See Comments)    joint pain, weakness   Tramadol Other (See Comments)    Serotonin syndrome   Citalopram Rash and Other (See Comments)    Insomnia and agitation also   Lamotrigine Itching, Rash and Other (See Comments)    Stevens-Johnson syndrome and Brown urine, also   Other Other (See Comments) and Cough    Cat dander   Topiramate Rash and Other (See Comments)    Nightmares, brown urine also   Wasp Venom Dermatitis    Edema and redness in area of wasp sting   Zetia [Ezetimibe]     Joint pain, weakness     Metabolic Disorder Labs: No results found for:  "HGBA1C", "MPG" No results found for: "PROLACTIN" Lab Results  Component Value Date   CHOL 226 (H) 04/07/2023   TRIG 95 04/07/2023   HDL 81 04/07/2023   CHOLHDL 2.8 04/07/2023   VLDL 44 (H) 04/02/2016   LDLCALC 129 (H) 04/07/2023   LDLCALC 116 (H) 10/27/2022   Lab Results  Component Value Date   TSH 1.134 12/18/2019    Therapeutic Level Labs: No results found for: "LITHIUM" No results found for: "CBMZ" No results found for: "VALPROATE"  Current Medications: Current Outpatient Medications  Medication Sig Dispense Refill   acetaminophen (TYLENOL) 325 MG tablet Take 2 tablets (650 mg total) by mouth every 6 (six) hours as needed for mild pain (or Fever >/= 101).     amLODipine (NORVASC) 2.5 MG tablet Take 1 tablet (2.5 mg total) by mouth daily. 90 tablet 1   azelastine (OPTIVAR) 0.05 % ophthalmic solution 1 drop 2 (two) times daily.     B Complex-C (SUPER B COMPLEX PO) Take by mouth.     BIOTIN PO Take 1 tablet by mouth daily.     Cholecalciferol 20 MCG (800 UNIT) TABS Take 1 tablet by mouth daily. 75 tablet 0   clopidogrel (PLAVIX) 75 MG tablet Take 1 tablet (75 mg total) by mouth daily. 90 tablet 3   Cyanocobalamin (B-12 PO) Take 1 tablet by mouth daily.     cyclobenzaprine (FLEXERIL) 5 MG tablet TAKE ONE TABLET BY MOUTH AT BEDTIME 30 tablet 0   diclofenac Sodium (VOLTAREN) 1 % GEL Apply topically as needed.     esomeprazole (NEXIUM 24HR CLEAR MINIS) 20 MG capsule Take 1 capsule (20 mg total) by mouth daily before breakfast. 30 capsule 1   Evolocumab (REPATHA SURECLICK) 140 MG/ML SOAJ Inject 140 mg into the skin every 14 (fourteen) days. 2 mL 5   gabapentin (NEURONTIN) 100 MG capsule Take 3 capsules by mouth at bedtime.     HYDROcodone-acetaminophen (NORCO/VICODIN) 5-325 MG tablet Take 2 tablets by mouth as needed for moderate pain.     losartan (COZAAR) 50 MG tablet Take 1 tablet (50 mg total) by mouth daily. 90 tablet 2   metoprolol tartrate (LOPRESSOR) 25 MG tablet TAKE  ONE-HALF TABLET BY MOUTH TWICE  A DAY 90 tablet 3   nitroGLYCERIN (NITROSTAT) 0.4 MG SL tablet Place 1 tablet (0.4 mg total) under the tongue as needed for chest pain. 25 tablet 5   Omega-3 Fatty Acids (OMEGA 3 PO) Take 1 capsule by mouth daily.     Plant Sterols and Stanols (CHOLESTOFF PLUS PO) Take by mouth daily.     Probiotic Product (PROBIOTIC ADVANCED PO) Take by mouth daily.     buPROPion ER (WELLBUTRIN SR) 100 MG 12 hr tablet Take 1 tablet (100 mg total) by mouth 2 (two) times daily. 60 tablet 1   traZODone (DESYREL) 50 MG tablet Take 0.5 tablets (25 mg total) by mouth at bedtime. 15 tablet 1   No current facility-administered medications for this visit.      Psychiatric Specialty Exam: Review of Systems  Cardiovascular:  Negative for chest pain.  Neurological:  Negative for tremors.  Psychiatric/Behavioral:  Negative for agitation and self-injury.     Blood pressure 118/76, pulse 76, height 5\' 2"  (1.575 m), weight 170 lb (77.1 kg).Body mass index is 31.09 kg/m.  General Appearance: Casual  Eye Contact:  Fair  Speech:  Clear and Coherent  Volume:  Normal  Mood:  better  Affect:  Constricted  Thought Process:  Goal Directed  Orientation:  oriented fully  Thought Content:  Rumination  Suicidal Thoughts:  No  Homicidal Thoughts:  No  Memory:  Immediate;   Fair  Judgement:  Fair  Insight:  Shallow  Psychomotor Activity:  Normal  Concentration:  Concentration: Fair  Recall:  Fair  Fund of Knowledge:Fair  Language: Fair  Akathisia:  No  Handed:    AIMS (if indicated):  no involuntary movements  Assets:  Desire for Improvement Social Support  ADL's:  Intact  Cognition: WNL  Sleep:  Fair   Screenings: GAD-7    Flowsheet Row Office Visit from 10/27/2022 in [facility] Medicine at [facility] Office Visit from 06/29/2022 in [facility] Primary [facility] at [facility]  Total GAD-7 Score 5 19      PHQ2-9     Flowsheet Row Office Visit from 12/30/2022 in [facility] at [facility] Office Visit from 11/04/2022 in [facility] Primary Care & Sports Medicine at [facility] Office Visit from 10/27/2022 in [facility] Primary Care & Sports Medicine at [facility] Office Visit from 06/29/2022 in [facility] Primary Care & Sports Medicine at [facility]  PHQ-2 Total Score 2 5 3 6   PHQ-9 Total Score 9 -- 15 21      Flowsheet Row ED from 04/09/2023 in [facility] at [facility] Visit from 02/01/2023 in [facility] at [facility] ED from 01/28/2023 in [facility] Urgent Care at [facility] RISK CATEGORY No Risk Error: Q3, 4, or 5 should not be populated when Q2 is No No Risk       Assessment and Plan: as follows  Prior documentation reviewed  Bipolar disorder ; current episode depression:  Manageable continue gaba and wellbutrin  GAD: manageable continue gabapentin  Alcohol use disorder; remains sober, continue AA    Sleep : continue trazadone and sleep hygiene  FU 2 m or earlier if needed, recommend therapy Direct care time 20 minutes  including chart review, face to face, documentation   Collaboration of Care: Primary Care Provider AEB reviewed notes and referral  Patient/Guardian was advised Release of Information must be obtained prior to  any record release in order to collaborate their care with an outside provider. Patient/Guardian was advised if they have not already done so to contact the registration department to sign all necessary forms in order for [place] to release information regarding their care.   Consent: Patient/Guardian gives verbal consent for treatment and assignment of benefits for services provided during this visit. Patient/Guardian expressed understanding and agreed to proceed.   Thresa Ross, MD 10/8/20242:37 PM

## 2023-05-10 ENCOUNTER — Ambulatory Visit (INDEPENDENT_AMBULATORY_CARE_PROVIDER_SITE_OTHER): Payer: Medicare Other | Admitting: Licensed Clinical Social Worker

## 2023-05-10 DIAGNOSIS — F0630 Mood disorder due to known physiological condition, unspecified: Secondary | ICD-10-CM | POA: Diagnosis not present

## 2023-05-10 DIAGNOSIS — F1020 Alcohol dependence, uncomplicated: Secondary | ICD-10-CM | POA: Diagnosis not present

## 2023-05-10 DIAGNOSIS — F411 Generalized anxiety disorder: Secondary | ICD-10-CM

## 2023-05-10 NOTE — Progress Notes (Signed)
   THERAPIST PROGRESS NOTE  Session Time: 11:00 AM to 11:52 AM  Participation Level: Active  Behavioral Response: CasualAlerteuthymic in session has symptoms of depression and anxiety  Type of Therapy: Individual Therapy  Treatment Goals addressed: Work on Pharmacologist for anxiety depression to decrease symptoms, work on mood management mood stabilization, coping patient is collaterally managing substance use issues through AA, coping  ProgressTowards Goals: Progressing-patient has significant stressors developing therapeutic rapport helpful how to place to process thoughts and feelings to help with coping  Interventions: Solution Focused, Strength-based, Supportive, and Other: coping  Summary: Lindsay [name] is a 67 y.o. female who presents with talking about interest in politics and patient running for office and therapist impressed.  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 Hard to lift things hard to garden for the bending over, squatting.  Impacts her work as well imagine impacts her mental health.  Patient describes being followed by psychiatrist is helpful for her being prescribed Wellbutrin and trazodone.  Of note patient has tangential thinking lack of focus but in a more free formatted way of discussion more helpful to explore topics.  Looking at nutrition assessment patient saying losing weight in the summer not eating because she wanted to make sure cats were fed therapist can see why patient is faced with some challenging situations.  We did the PHQ depression symptoms were significantly high indications of how regular therapy will be helpful for patient.  Therapy session good for developing therapeutic relationship  helping patient to process thoughts and feelings in session.  Suicidal/Homicidal: No  Plan: Return again in 2 weeks.2.  Therapist find out more about patient's relationships dynamics of her life, work on anxiety explore past issues as part of treatment goal  Diagnosis: Mood disorder in conditions classified elsewhere, Generalized  anxiety disorder, severe alcohol use to disorder  Collaboration of Care: Other none needed  Patient/Guardian was advised Release of Information must be obtained prior to any record release in order to collaborate their care with an outside provider. Patient/Guardian was advised if they have not already done so to contact the registration department to sign all necessary forms in order for [place] to release information regarding their care.   Consent: Patient/Guardian gives verbal consent for treatment and assignment of benefits for services provided during this visit. Patient/Guardian expressed understanding and agreed to proceed.   Coolidge Breeze, LCSW 05/10/2023

## 2023-05-11 ENCOUNTER — Other Ambulatory Visit: Payer: Self-pay

## 2023-05-11 DIAGNOSIS — I10 Essential (primary) hypertension: Secondary | ICD-10-CM

## 2023-05-11 MED ORDER — AMLODIPINE BESYLATE 2.5 MG PO TABS
2.5000 mg | ORAL_TABLET | Freq: Every day | ORAL | 0 refills | Status: DC
Start: 2023-05-11 — End: 2023-11-07

## 2023-05-24 ENCOUNTER — Ambulatory Visit (INDEPENDENT_AMBULATORY_CARE_PROVIDER_SITE_OTHER): Payer: Medicare Other | Admitting: Licensed Clinical Social Worker

## 2023-05-24 DIAGNOSIS — F0630 Mood disorder due to known physiological condition, unspecified: Secondary | ICD-10-CM | POA: Diagnosis not present

## 2023-05-24 DIAGNOSIS — F1020 Alcohol dependence, uncomplicated: Secondary | ICD-10-CM | POA: Diagnosis not present

## 2023-05-24 DIAGNOSIS — F411 Generalized anxiety disorder: Secondary | ICD-10-CM | POA: Diagnosis not present

## 2023-05-24 NOTE — Progress Notes (Signed)
THERAPIST PROGRESS NOTE  Session Time: 11:00 AM to 11:55 AM  Participation Level: Active  Behavioral Response: CasualAlertEuthymic  Type of Therapy: Individual Therapy  Treatment Goals addressed: Work on Pharmacologist for anxiety depression to decrease symptoms, work on mood management mood stabilization, coping patient is collaterally managing substance use issues through AA, coping  ProgressTowards Goals: Progressing  Interventions: Solution Focused, Strength-based, Supportive, and Other: coping  Summary: Lindsay [name] is a 67 y.o. female who presents with talking about some of her interest educating therapist about it therapist impressed talked about gardening patient's extensive experience that is helped her gain a lot of knowledge something that therapist also enjoys as a subject to talk about.  Therapist noted she was educating her about some beginning strategies for good gardening and good resources. Talked about her friend Lindsay [name] known since 7th grade united though [employer]. She was roommate in her apartment in 85's similar interest. Together experimenting took awhile to be open about it. Lindsay [name] left in 97 to go to school. 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 Like sisters. Patient took her to great places [place], cruises pay for her or halfsies.   Would make a business deduction. Now she is Living in parent's house. Patient's parents going homeless 2007-2008 patient was violent drunk lot of rage. When [name]'s  mom passed parents homeless nowhere to go handicap brother mom started having dementia and dad. Found a nursing home for [place]. Lindsay [name] had a handicap ramp built invited to them to stay until change her mind when was inconvenient for patient's  parents charged them $1.00 Parents went  to live with relatives. As Lindsay [name] gotten older  dead pan face like mom who was not emotionally nurturing to her. She doesn't show affect she was more emotive  when younger, interrupts won't let finish a thought how dad was with mom when she had dementia. She is helping patient pay 2nd health insurance, bought a car paying for her insurance and up keep on that. Steps up when money falls short, helps her taking care of the cats, help make decisions. For example helping her make decision to surrender [name], her cat, because of a sore on it that wasn't going to get better. Patient says she is working on forcing herself accept Lindsay [name] as she is understand her own  need to see emotion on her face and her failure to do that. Her lack of patience with way share thoughts. Patient is a spiral thinking she is linear thinker.   Through discussion therapist able to utilize clinical interventions able to process patient's thoughts and feelings to help with coping.  Patient shared more about what she is passionate about therapist impressed with her wealth of knowledge and skills utilize this as a supportive and strength-based intervention.  Talked about her friendship with Lindsay [name] who is significant relationship for her about their history and their relationship how very supportive it is for her.  In terms of clinical work assess positive patient has this type of support managing her symptoms additionally patient talked about some of the issues she is working on coming to acceptance about to work through with her friendship therapist saying this is healthy coping working on and acceptance go noted strengthening of therapeutic relationship enjoying talking about different subjects with patient makes for very positive session  Suicidal/Homicidal: No  Plan: Return again in 2 weeks.2.  Anxiety, explore past issues as part of treatment goals,  patient brought up acceptance therapist will talk about DBT skills and how there are acceptance skills and change skills a broader context that patient is using good emotional regulation skills  Diagnosis: Mood disorder in conditions classified  elsewhere, Generalized  anxiety disorder, severe alcohol use to disorder  Collaboration of Care: Other none needed  Patient/Guardian was advised Release of Information must be obtained prior to any record release in order to collaborate their care with an outside provider. Patient/Guardian was advised if they have not already done so to contact the registration department to sign all necessary forms in order for [place] to release information regarding their care.   Consent: Patient/Guardian gives verbal consent for treatment and assignment of benefits for services provided during this visit. Patient/Guardian expressed understanding and agreed to proceed.   Lindsay Breeze, LCSW 05/24/2023

## 2023-06-06 ENCOUNTER — Other Ambulatory Visit: Payer: Self-pay | Admitting: Physician Assistant

## 2023-06-07 ENCOUNTER — Ambulatory Visit (INDEPENDENT_AMBULATORY_CARE_PROVIDER_SITE_OTHER): Payer: Medicare Other | Admitting: Licensed Clinical Social Worker

## 2023-06-07 DIAGNOSIS — F1020 Alcohol dependence, uncomplicated: Secondary | ICD-10-CM | POA: Diagnosis not present

## 2023-06-07 DIAGNOSIS — F411 Generalized anxiety disorder: Secondary | ICD-10-CM

## 2023-06-07 DIAGNOSIS — F0630 Mood disorder due to known physiological condition, unspecified: Secondary | ICD-10-CM | POA: Diagnosis not present

## 2023-06-07 NOTE — Progress Notes (Signed)
THERAPIST PROGRESS NOTE  Session Time: 11:00 AM to 11:51 AM  Participation Level: Active  Behavioral Response: CasualAlertDepressed and patient reports depression although euthymic during session tearful on a couple occasions  Type of Therapy: Individual Therapy  Treatment Goals addressed: Work on Pharmacologist for anxiety depression to decrease symptoms, work on mood management mood stabilization, coping patient is collaterally managing substance use issues through AA, coping  ProgressTowards Goals: Progressing-processed thoughts and feelings in session to help cope with depression therapist providing strength-based supportive intervention noting circle of control can be helpful to deal with how stressors outside of her control we can better manage  Interventions: Solution Focused, Strength-based, Supportive, and Other: coping  Summary: Lindsay [name] is a 67 y.o. female who presents with deeply depressed, yesterday in a black mood. Body breaking down maybe 10 years left time to fix up garden and house at least bring order to it. Not money would be on streets if not for [place]. Distant warrant owes. If don't file statue of limits can extend. Running out of money wondering what is going to do. Enid Derry pestering her what is wrong trust me nothing to do with you. Patient trying to cope as Enid Derry is helping out less has her financial concerns.   Talked about family dynamics rage issues around father not pleased with mom she is the one patient has a soft spot and Mack Guise. Dad walked from reality decades, years ago he didn't care. When young thought about shooting him. Therapist explored why she felt that way, he lied kept info from mom almost homeless not paying rent not paying for Georgie's school. He humiliated Mom in public. He was awful to people in a soft way passive aggressive. He was a [place] gentleman things feel apart like a lot of southerners in 4's. Patient shares her family for awhile  lived  in attic, baby still born thirty days overdue. Poor Velna Hatchet didn't let mom hold don't know what happened to her. With [employer] induced birth didn't want the same thing to happen to [facility], they were calling the doctor back breach birth more urgent. Gave her demerol oxygen cut off from umbilical cord. As far as patient, Mom says condom mistake. Mom mixed bag patient was unexpected burden patient high maintenance kids lots of energy probably the bipolar. Mom was a lot of fun, so creative. Aesthetic taste and Dad had.  ability.  Reviewed still in same place. Gave up cheddar, he was not healing, may lose house. Malen Gauze parent came and got him can help him heal his heel, needs a skin graft give him what he needs. Patient went on to tell therapist did not put him down instead took to a place where they can foster therapist noted good news.  Patient said as far as coping distract like crazy what she has been doing close to crying.  Therapist noted the type of thoughts patient is telling herself are impacting her mood things like her body is getting old, only having 10 more years to live thoughts where she needs to replace with something a little bit more positive and encouraging the same time recognizing cannot get rid of all the stuff eating the negative some of it cannot make disappear.  Noted distraction is one of our best go to's for all of [place] helps and can work.  Patient reviewed recent events how she was impacted noted friend [name] has helped her out when most needed.  Reviewed family history as this is one of  the reasons patient wants to do treatment to work out dynamics in the family patient going into more detail so therapist learns more about personalities, how patient impacted both positive and negative as patient wants to sort this out for her own psychological wellbeing.  Reviewed election this appointment therapist showed patient circle of control is helpful for her recognizing what we do and do not have  control over helps with coping  Suicidal/Homicidal: No  Plan: Return again in 2 weeks.2.  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 Patient/Guardian was advised if they have not already done so to contact the registration department to sign all necessary forms in order for [place] to release information regarding their care.   Consent: Patient/Guardian gives verbal consent for treatment and assignment of benefits for services provided during this visit. Patient/Guardian expressed understanding and agreed to proceed.   Coolidge Breeze, LCSW 06/07/2023

## 2023-06-09 ENCOUNTER — Telehealth: Payer: Self-pay | Admitting: Cardiovascular Disease

## 2023-06-09 NOTE — Telephone Encounter (Signed)
Pt c/o medication issue:  1. Name of Medication: Evolocumab (REPATHA SURECLICK) 140 MG/ML SOAJ   2. How are you currently taking this medication (dosage and times per day)?   3. Are you having a reaction (difficulty breathing--STAT)?   4. What is your medication issue? Patient is requesting call back to discuss financial assistance. Please advise.

## 2023-06-09 NOTE — Telephone Encounter (Signed)
Spoke with patient, she did not apply for patient assistance  Resent the information [place] sent in September  Explained to her was [place] of the [employer] but would reach out to [employer] with Dr Rennis Golden

## 2023-06-10 NOTE — Telephone Encounter (Signed)
Spoke with patient. Explained that based on provider portal of [employer] she has grant funds and her grant does not expire until 09/2023. She said [employer] has issues processing this -- unsure why. Sent her a message in MyChart with all the grant ID information for her to provide to pharmacy

## 2023-06-11 DIAGNOSIS — Z23 Encounter for immunization: Secondary | ICD-10-CM | POA: Diagnosis not present

## 2023-06-13 NOTE — Telephone Encounter (Signed)
See mychart message, patient has [place]

## 2023-06-21 ENCOUNTER — Ambulatory Visit (INDEPENDENT_AMBULATORY_CARE_PROVIDER_SITE_OTHER): Payer: Medicare Other | Admitting: Licensed Clinical Social Worker

## 2023-06-21 DIAGNOSIS — F411 Generalized anxiety disorder: Secondary | ICD-10-CM

## 2023-06-21 DIAGNOSIS — F1020 Alcohol dependence, uncomplicated: Secondary | ICD-10-CM

## 2023-06-21 DIAGNOSIS — F0630 Mood disorder due to known physiological condition, unspecified: Secondary | ICD-10-CM

## 2023-06-21 NOTE — Progress Notes (Signed)
THERAPIST PROGRESS NOTE  Session Time: 1:00 PM to 1:50 PM  Participation Level: Active  Behavioral Response: CasualAlertEuthymic  Type of Therapy: Individual Therapy  Treatment Goals addressed: Work on Pharmacologist for anxiety depression to decrease symptoms, work on mood management mood stabilization, coping patient is collaterally managing substance use issues through AA, coping  ProgressTowards Goals: Progressing-assessed therapy session processing feelings different subjects helpful for mood other positive developments good thanks for giving plans meeting a new friend also talked about stressors to help with coping  Interventions: Solution Focused, Strength-based, Supportive, and Other: Coping  Summary: Lindsay [name] is a 67 y.o. female who presents with down moments hasn't felt physically well. Lost sense of time thought knew schedule missed today but didn't mean to. Had the wrong schedule. Thinks the dementia is setting in. Two days the general rule work tomorrow cut back hours because of knees point of writing her schedule and not just photographing don't put in right. Now more careful email to [place].  Therapist encouraged her was writing down what she does to her would forget Made a friend.  Somebody you came to look at roof and struck up a conversation both interested in there conversation so got a number may meet coffee shop from [place] interesting person.   Talked about her dementia comes either from black mold, and/or on herself,  drinking didn't help. Mom had dementia and started at her age.  Patient say though why be afraid nothing to do about it. Why waste on human energy can't stop some control like don't drink, exercise get out and breath feel better after being in garden digging and doing physical things. Not walking mostly standing at work that is painful 6 hours straight 15 minute break.   Thinking about writing down things remember and categorize finds memoirs interesting  things that no longer exist. Wants to do it so doesn't forget. Patient talking interest in souther agrarians a Camera operator is Donney Rankins, Tobie Poet is early [place], another one is Geologist, engineering.  This influence the 2025 movement shares this comes from when [place] change the role of government fantasy of agriculture living, against [place], [place] conservative Catholic [place]. Patient libertarian so pulled perspective but not an ideologue. People dependent on government idea not possible not possible.  Understands where they are coming from that how economic policy affecting people. Looking forward to [facility] Thanksgiving.  Noted has good plans doing okay so positive experience she is looking forward to.    In review during session conversation covered various topics difficulty of work with physical problems, problems with memory therapist liked how she said not worry something she cannot control only control what you can.  Talked about some positive coping patient uses like getting into the garden, nose exercises good, drinking water. Patient has a lot of different interests so shared some of those with therapist very interesting conversation wide ranging that was educating to therapist assess helpful for mood for patient to talk about subjects interesting and that therapist thought were interesting.  The exchange of information was mood enhancing and enhanced by topics. This indicates patient is a person with wide interest strengths and talents. Additionally provider use empathetic listening direct questioning discussed current and underlying problem which involves patient connecting with others why positive she is going to Thanksgiving has made a friend connecting with this therapist.  Processing thoughts and feelings to help with coping. Suicidal/Homicidal: No  Plan: Return again in 2 weeks.2.Work on strategies  for depression, processed thoughts and feelings in session  educate patient on emotional regulation skills  Diagnosis: Mood disorder in conditions classified elsewhere, Generalized  anxiety disorder, severe alcohol use  disorder  Collaboration of Care: Other none needed  Patient/Guardian was advised Release of Information must be obtained prior to any record release in order to collaborate their care with an outside provider. Patient/Guardian was advised if they have not already done so to contact the registration department to sign all necessary forms in order for [place] to release information regarding their care.   Consent: Patient/Guardian gives verbal consent for treatment and assignment of benefits for services provided during this visit. Patient/Guardian expressed understanding and agreed to proceed.   Coolidge Breeze, LCSW 06/21/2023

## 2023-07-05 ENCOUNTER — Ambulatory Visit (INDEPENDENT_AMBULATORY_CARE_PROVIDER_SITE_OTHER): Payer: Medicare Other | Admitting: Psychiatry

## 2023-07-05 ENCOUNTER — Encounter (HOSPITAL_COMMUNITY): Payer: Self-pay | Admitting: Psychiatry

## 2023-07-05 VITALS — BP 123/79 | HR 84 | Ht 62.0 in | Wt 173.0 lb

## 2023-07-05 DIAGNOSIS — F0630 Mood disorder due to known physiological condition, unspecified: Secondary | ICD-10-CM | POA: Diagnosis not present

## 2023-07-05 DIAGNOSIS — F1020 Alcohol dependence, uncomplicated: Secondary | ICD-10-CM | POA: Diagnosis not present

## 2023-07-05 DIAGNOSIS — M5412 Radiculopathy, cervical region: Secondary | ICD-10-CM | POA: Diagnosis not present

## 2023-07-05 DIAGNOSIS — M25511 Pain in right shoulder: Secondary | ICD-10-CM | POA: Diagnosis not present

## 2023-07-05 DIAGNOSIS — M542 Cervicalgia: Secondary | ICD-10-CM | POA: Diagnosis not present

## 2023-07-05 DIAGNOSIS — M51369 Other intervertebral disc degeneration, lumbar region without mention of lumbar back pain or lower extremity pain: Secondary | ICD-10-CM | POA: Diagnosis not present

## 2023-07-05 DIAGNOSIS — F411 Generalized anxiety disorder: Secondary | ICD-10-CM | POA: Diagnosis not present

## 2023-07-05 MED ORDER — BUPROPION HCL ER (SR) 100 MG PO TB12: 100.0000 mg | ORAL_TABLET | Freq: Two times a day (BID) | ORAL | 1 refills | Status: DC

## 2023-07-05 MED ORDER — GABAPENTIN 100 MG PO CAPS: 100.0000 mg | ORAL_CAPSULE | Freq: Two times a day (BID) | ORAL | 1 refills | Status: DC

## 2023-07-05 NOTE — Progress Notes (Signed)
[facility] Follow up visit  Patient Identification: Lindsay [name] MRN:  [id number] Date of Evaluation:  07/05/2023 Referral Source: primary care Chief Complaint:   Chief Complaint  Patient presents with   Follow-up  Depression, anxiety , alcohol use Visit Diagnosis:    ICD-10-CM   1. Mood disorder in conditions classified elsewhere  F06.30     2. Generalized anxiety disorder  F41.1     3. Severe alcohol use disorder  F10.20       History of Present Illness: Patient is a 67 years old currently single Caucasian female initailly  referred by primary care physician to establish care for history of bipolar, depression and anxiety.  Patient has been in treatment many years ago but has not been taking any medication for more than 10 years and regarding to her bipolar or depression  Patient likes her job it helps her socialize  Overall fair but has down days she works on Pharmacologist to distract from negative thougths She feels med need not increased  Gaba helps with anxiety   Sober and not drinking that helps with not feeling fatigue  Sleep fair  seldom takes trazadone   She has had multiple admission in the past for alcohol-related detox and depression.    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    Consequences of Substance Abuse: Medical Consequences:  discussed effect of alcohol to depression, judjement  Past Medical History:  Past Medical History:  Diagnosis Date   Abnormal mammogram    Thick Tissue   Acquired trigger  finger of right ring finger 04/20/2022   Arthritis    Bilateral wrist pain 04/20/2022   Bipolar 1 disorder    Cataract    OU   Cervical radiculopathy 10/14/2021   Chalazion of left lower eyelid 04/15/2022   Chest pain 02/11/2015   Chronic atrial fibrillation 09/08/2015   Colon polyp    Compressed cervical disc    Coronary artery disease involving native coronary artery with angina pectoris 03/24/2015   Dorsalgia 09/22/2018   Dysphagia 09/22/2018   Essential hypertension 03/03/2015   Generalized anxiety disorder    GERD without esophagitis 09/22/2018   Hematemesis 12/18/2019   Hepatic steatosis 04/22/2022   History of appendectomy 1963   Hypertensive retinopathy    OU   Irritable bowel syndrome without diarrhea 09/22/2018   Lactic acidosis 12/18/2019   LLQ pain 04/15/2022   Major depressive disorder    Migraine without status migrainosus, not intractable 09/22/2018   Mixed hyperlipidemia 05/07/2016   Non-ST elevation MI (NSTEMI) 07/26/2008   No cardiac catheterization at the time.   Obstructive sleep apnea    no CPAP use   Orthostasis    Osteoarthritis of carpometacarpal (CMC) joint of thumb 04/20/2022   Osteoarthritis of knee 09/22/2018   PAF (paroxysmal atrial fibrillation) 10/09/2018   Pain in joint of right hip 07/03/2020   Pain in right knee 12/14/2018   Recurrent syncope    Rib pain 07/03/2020   Sciatica of right side 09/22/2018   Severe alcohol use disorder    Vitamin D  deficiency     Past Surgical History:  Procedure Laterality Date   APPENDECTOMY     CARDIAC CATHETERIZATION N/A 03/25/2015   Procedure: Left Heart Cath and Coronary Angiography;  Surgeon: Marykay Lex, MD;  Location: [facility] INVASIVE CV LAB;  Service: Cardiovascular;  Laterality: N/A;   CORONARY STENT INTERVENTION N/A 10/09/2018   Procedure: CORONARY STENT INTERVENTION;  Surgeon: Corky Crafts, MD;  Location: [facility] INVASIVE CV LAB;  Service: Cardiovascular;  Laterality: N/A;   DILATATION &  CURETTAGE/HYSTEROSCOPY WITH MYOSURE N/A 05/19/2016   Procedure: DILATATION & CURETTAGE/HYSTEROSCOPY WITH MYOSURE;  Surgeon: Geryl Rankins, MD;  Location: [facility] OR[facility];  Service: Gynecology;  Laterality: N/A;  Possible Myosure for polyps.   EYE SURGERY     LEFT HEART CATH AND CORONARY ANGIOGRAPHY N/A 10/09/2018   Procedure: LEFT HEART CATH AND CORONARY ANGIOGRAPHY;  Surgeon: Corky Crafts, MD;  Location: [facility] INVASIVE CV LAB;  Service: Cardiovascular;  Laterality: N/A;   MOUTH SURGERY     URETHRAL DILATION      Family Psychiatric History: cousin some mental illness, father possible depression   Family History:  Family History  Problem Relation Age of Onset   Vascular Disease Mother    Dementia Mother    Hypertension Mother    Stroke Mother    Depression Mother    Dementia Father    Heart attack Father    Stroke Father    Heart attack Maternal Grandmother    Heart attack Maternal Grandfather    Kidney failure Paternal Grandmother    Heart attack Paternal Grandfather     Social History:   Social History   Socioeconomic History   Marital status: Single    Spouse name: Not on file   Number of children: 0   Years of education: 16   Highest education level: Bachelor's degree (e.g., BA, AB, BS)  Occupational History   Not on file  Tobacco Use   Smoking status: Never   Smokeless tobacco: Never  Vaping Use   Vaping status: Never Used  Substance and Sexual Activity   Alcohol use: Yes    Comment: hx of severe alcohol abuse; most recent admission 02/2022   Drug use: Never   Sexual activity: Not Currently  Other Topics Concern   Not on file  Social History Narrative   Right handed   Drinks caffeine   Split level   Social Determinants of Health   Financial Resource Strain: High Risk (10/26/2022)   Overall Financial Resource Strain (CARDIA)    Difficulty of Paying Living Expenses: Hard  Food Insecurity: Food Insecurity Present (10/26/2022)   Hunger Vital Sign    Worried About  Running Out of Food in the Last Year: Often true    Ran Out of Food in the Last Year: Sometimes true  Transportation Needs: Unmet Transportation Needs (10/26/2022)   PRAPARE - Transportation    Lack of Transportation (Medical): Yes    Lack of Transportation (Non-Medical): Yes  Physical Activity: Unknown (10/26/2022)   Exercise Vital Sign    Days of Exercise per Week: 0 days    Minutes of Exercise per Session: Not on file  Stress: Stress Concern Present (10/26/2022)   [employer] of Occupational Health - Occupational Stress Questionnaire    Feeling of Stress : Very much  Social Connections: Socially Isolated (10/26/2022)   Social Connection and Isolation Panel [NHANES]    Frequency of Communication with Friends and Family: More than three times a week    Frequency of Social  Gatherings with Friends and Family: Once a week    Attends Religious Services: Never    Database administrator or Organizations: No    Attends Engineer, structural: Not on file    Marital Status: Never married    Additional Social History: grew up with parents, rough growing up or challenging with some conflicts with parents  Allergies:   Allergies  Allergen Reactions   Sulfa Antibiotics Swelling and Rash   Ciprofloxacin Nausea And Vomiting   Depakote [Divalproex Sodium] Other (See Comments)    Reaction not recalled- "it was 20 years ago"   Pine Itching   Risperidone Other (See Comments)    Unknown reaction   Seroquel [Quetiapine Fumarate] Other (See Comments)    VIVID nightmares   Statins Other (See Comments)    joint pain, weakness   Tramadol Other (See Comments)    Serotonin syndrome   Citalopram Rash and Other (See Comments)    Insomnia and agitation also   Lamotrigine Itching, Rash and Other (See Comments)    Stevens-Johnson syndrome and Brown urine, also   Other Other (See Comments) and Cough    Cat dander   Topiramate Rash and Other (See Comments)    Nightmares, brown urine also   Wasp  Venom Dermatitis    Edema and redness in area of wasp sting   Zetia [Ezetimibe]     Joint pain, weakness     Metabolic Disorder Labs: No results found for: "HGBA1C", "MPG" No results found for: "PROLACTIN" Lab Results  Component Value Date   CHOL 226 (H) 04/07/2023   TRIG 95 04/07/2023   HDL 81 04/07/2023   CHOLHDL 2.8 04/07/2023   VLDL 44 (H) 04/02/2016   LDLCALC 129 (H) 04/07/2023   LDLCALC 116 (H) 10/27/2022   Lab Results  Component Value Date   TSH 1.134 12/18/2019    Therapeutic Level Labs: No results found for: "LITHIUM" No results found for: "CBMZ" No results found for: "VALPROATE"  Current Medications: Current Outpatient Medications  Medication Sig Dispense Refill   acetaminophen (TYLENOL) 325 MG tablet Take 2 tablets (650 mg total) by mouth every 6 (six) hours as needed for mild pain (or Fever >/= 101).     amLODipine (NORVASC) 2.5 MG tablet Take 1 tablet (2.5 mg total) by mouth daily. 90 tablet 0   azelastine (OPTIVAR) 0.05 % ophthalmic solution INSTILL TWO DROPS IN [facility] EYE TWICE DAILY 6 mL 5   B Complex-C (SUPER B COMPLEX PO) Take by mouth.     BIOTIN PO Take 1 tablet by mouth daily.     Cholecalciferol 20 MCG (800 UNIT) TABS Take 1 tablet by mouth daily. 75 tablet 0   clopidogrel (PLAVIX) 75 MG tablet Take 1 tablet (75 mg total) by mouth daily. 90 tablet 3   Cyanocobalamin (B-12 PO) Take 1 tablet by mouth daily.     cyclobenzaprine (FLEXERIL) 5 MG tablet TAKE ONE TABLET BY MOUTH AT BEDTIME 30 tablet 0   diclofenac Sodium (VOLTAREN) 1 % GEL Apply topically as needed.     esomeprazole (NEXIUM 24HR CLEAR MINIS) 20 MG capsule Take 1 capsule (20 mg total) by mouth daily before breakfast. 30 capsule 1   Evolocumab (REPATHA SURECLICK) 140 MG/ML SOAJ Inject 140 mg into the skin every 14 (fourteen) days. 2 mL 5   HYDROcodone-acetaminophen (NORCO/VICODIN) 5-325 MG tablet Take 2 tablets by mouth as needed for moderate pain.     losartan (COZAAR) 50 MG tablet Take 1  tablet (50  mg total) by mouth daily. 90 tablet 2   metoprolol tartrate (LOPRESSOR) 25 MG tablet TAKE ONE-HALF TABLET BY MOUTH TWICE A DAY 90 tablet 3   nitroGLYCERIN (NITROSTAT) 0.4 MG SL tablet Place 1 tablet (0.4 mg total) under the tongue as needed for chest pain. 25 tablet 5   Omega-3 Fatty Acids (OMEGA 3 PO) Take 1 capsule by mouth daily.     Plant Sterols and Stanols (CHOLESTOFF PLUS PO) Take by mouth daily.     Probiotic Product (PROBIOTIC ADVANCED PO) Take by mouth daily.     buPROPion ER (WELLBUTRIN SR) 100 MG 12 hr tablet Take 1 tablet (100 mg total) by mouth 2 (two) times daily. 60 tablet 1   gabapentin (NEURONTIN) 100 MG capsule Take 1 capsule (100 mg total) by mouth 2 (two) times daily. 60 capsule 1   No current facility-administered medications for this visit.      Psychiatric Specialty Exam: Review of Systems  Cardiovascular:  Negative for chest pain.  Neurological:  Negative for tremors.  Psychiatric/Behavioral:  Negative for agitation and self-injury.     Blood pressure 123/79, pulse 84, height 5\' 2"  (1.575 m), weight 173 lb (78.5 kg).Body mass index is 31.64 kg/m.  General Appearance: Casual  Eye Contact:  Fair  Speech:  Clear and Coherent  Volume:  Normal  Mood: fair  Affect:  Constricted  Thought Process:  Goal Directed  Orientation:  oriented fully  Thought Content:  Rumination  Suicidal Thoughts:  No  Homicidal Thoughts:  No  Memory:  Immediate;   Fair  Judgement:  Fair  Insight:  Shallow  Psychomotor Activity:  Normal  Concentration:  Concentration: Fair  Recall:  Fair  Fund of Knowledge:Fair  Language: Fair  Akathisia:  No  Handed:    AIMS (if indicated):  no involuntary movements  Assets:  Desire for Improvement Social Support  ADL's:  Intact  Cognition: WNL  Sleep:  Fair   Screenings: GAD-7    Flowsheet Row Office Visit from 10/27/2022 in [facility] Primary [facility] & Sports Medicine at [facility] Office Visit from 06/29/2022 in  [facility] Primary Care & [facility] Medicine at [facility]  Total GAD-7 Score 5 19      PHQ2-9    Flowsheet Row Counselor from 05/10/2023 in [facility] at [facility] Office Visit from 12/30/2022 in [facility] at [facility] Office Visit from 11/04/2022 in [facility] Primary Care & Sports Medicine at [facility] Office Visit from 10/27/2022 in [facility] Primary Care & Sports Medicine at [facility] Office Visit from 06/29/2022 in [facility] Primary Care & Sports Medicine at [facility] Total Score 2 2 5 3 6   PHQ-9 Total Score 18 9 -- 15 21      Flowsheet Row Counselor from 05/10/2023 in [facility] at [facility] ED from 04/09/2023 in [facility] Urgent Care at [facility] Visit from 02/01/2023 in [facility] Out[facility] Behavioral [facility] at [facility]  C-SSRS RISK CATEGORY No Risk No Risk Error: Q3, 4, or 5 should not be populated when Q2 is No       Assessment and Plan: as follows  Prior documentation reviewed  Bipolar disorder ; current episode depression:  Manageable continue wellbutrin and gaba Feels not increase med needed  GAD: manageable continue gabapentin and distraction from negative thoughts  Alcohol use disorder; remains sober continue AA . Relapse prevention discussed   Sleep :doing fair  and seldom takes trazadone  FU 2 -  m or earlier if needed, recommend therapy Direct care time 20 minutes including chart review, face to face, documentation   Collaboration of Care: Primary Care Provider AEB reviewed notes and referral  Patient/Guardian was advised Release of Information must be obtained prior to any record release in order to collaborate their care with an outside provider. Patient/Guardian was advised if they have not already done so to contact the registration department to  sign all necessary forms in order for [place] to release information regarding their care.   Consent: Patient/Guardian gives verbal consent for treatment and assignment of benefits for services provided during this visit. Patient/Guardian expressed understanding and agreed to proceed.   Thresa Ross, MD 12/10/20241:39 PM

## 2023-07-12 ENCOUNTER — Ambulatory Visit (INDEPENDENT_AMBULATORY_CARE_PROVIDER_SITE_OTHER): Payer: Medicare Other | Admitting: Licensed Clinical Social Worker

## 2023-07-12 DIAGNOSIS — F0630 Mood disorder due to known physiological condition, unspecified: Secondary | ICD-10-CM | POA: Diagnosis not present

## 2023-07-12 DIAGNOSIS — F411 Generalized anxiety disorder: Secondary | ICD-10-CM

## 2023-07-12 DIAGNOSIS — F1020 Alcohol dependence, uncomplicated: Secondary | ICD-10-CM | POA: Diagnosis not present

## 2023-07-12 NOTE — Progress Notes (Signed)
THERAPIST PROGRESS NOTE  Session Time: 1:00 PM to 1:53 PM  Participation Level: Active  Behavioral Response: CasualAlertmood euthymic during session but talked as well about sadness, mind can go to a dark place  Type of Therapy: Individual Therapy  Treatment Goals addressed: Work on Pharmacologist for anxiety depression to decrease symptoms, work on mood management mood stabilization, coping patient is collaterally managing substance use issues through AA, coping  ProgressTowards Goals: Progressing-worked on coping strategies for negative thoughts also processing patient's issues with her relationships to help with coping  Interventions: Solution Focused, Strength-based, Supportive, and Other: Mood regulation  Summary: Lindsay [name] is a 67 y.o. female who presents with being self-aware, priority of being on time, has been working on this 1 thing she does is starts earlier in her day therapist noted this is a positive goal she is working on and notices it. .  Asked if therapist aware of 3rd in store positive letters from customers. Vice president quoting the letter the comment by the vice president was how extraordinary the letter from customer was.    Felt alone growing up a lot of time in head. Move constantly memories of looking out of car. Talked about childhood memories.  Good spatial sense helping mom navigate to visit family. Patient is convinced how our understanding of the world has been through creating stories with what we think we know. How religions started. Wants to start writing er memories. Write story that comes to mind let it go where it goes. Record memories while can and remember. 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 As it comes let it flow switch subject note to self come back to work. Talked about doing scholastic research and enjoying that wanted to do that for a living.  Mental health not good dark moments her way of coping is to keep busy and distraction. Called  old friend from college. Blind to himself hits home. Therapist asked if that made feel better no she worries about him. Two hour conversation with him. Just happened yesterday not getting help done with him if doesn't know better then nothing can do better not looking to fix himself focus on everything else.  Right on edge with sadness. Another helpful resource is four agreements Letting go of negative thoughts goes to [employer] busy mind knew that since young. Loved a history class in high school so much realized could make intricate connections in history more interesting in life than math was. Now realize how much more difficult it really is and how  ephemeral. Path attention to brain science even though she knows alcohol she knows that it is not good for her.  Talked about relationship with friend, [name]. Gives an example that she is directing patient at at a get together said enough and walked away.  Therapist did not think that was so bad as the standard walk away from situation that is negative.  Patient trying to get her to except her boundaries and looking for something in the car had insists finally her know meant she did not want her to help her look in the car. She has "resting bitch face" want a positive reaction not a critque feels deflated makes her feel small she is doing so much to help she is backing away from that patient is working on not asking as much. Realizing not sharing her problems just gives her ammunition. Didn't even ask about what was that about when patient said enough patient says it  tells her doesn't care about how feel how she feels when doesn't ask why and if shares how feel something then wrong. Feels she can be oblivious.  Patient says all of her life dealt with sadness and shared ways she has found to cope with that she says music but cannot listen to bad music therapist noted what she mentioned in session was intellectual pursuits.  Next time pick up with her relationship  with [name] both therapist and patient work on staying focused in session.  Certain things were helpful to bring up patient saying she felt alone growing up in her head later on she said has felt sad a lot of her life mostly because of brother this relates to therapist note from reading Dr.'s report of negative thoughts and how to work on that.  Patient stays busy distraction, patient follows intellectual pursuits therapist complemented her on her interest sharing them in session very interesting subjects.  Was noted other ways to work on breaking away from negative thoughts telling her cells the thought is untruthful and accurate not giving it the attention, can challenge her thought will get into more specifics that may be other ways to help coping but assess patient's way of coping one of the most effective.  We talked about a recent stressor for her feeling very sad we reviewed her own insight cannot control other people to help lift that weight for her.  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  Tried to set a boundary with her and was oblivious to this noted helpful to get her emotions out issues with her friend to work through in session.  Suicidal/Homicidal: No  Plan: Return again in 2 weeks.2.  Talk about relationship with [name] also talk about coping strategies that would be helpful for patient, talk about how to manage negative thoughts look at emotional regulation strategies from AI look at strategies for depression  Diagnosis: Mood disorder in conditions classified elsewhere, Generalized  anxiety disorder, severe alcohol use   disorder  Collaboration of Care: Medication Management AEB review of Dr. Gilmore Laroche note  Patient/Guardian was advised Release of Information must be obtained prior to any record release in order to collaborate their care with an outside provider. Patient/Guardian was advised if they have not already done so to contact the registration department to sign all necessary forms in order for [place] to release information regarding their care.   Consent: Patient/Guardian gives verbal consent for treatment and assignment of benefits for services provided during this visit. Patient/Guardian expressed understanding and agreed to proceed.   Coolidge Breeze, LCSW 07/12/2023

## 2023-07-18 ENCOUNTER — Encounter (HOSPITAL_BASED_OUTPATIENT_CLINIC_OR_DEPARTMENT_OTHER): Payer: Self-pay | Admitting: Family

## 2023-07-18 ENCOUNTER — Ambulatory Visit (INDEPENDENT_AMBULATORY_CARE_PROVIDER_SITE_OTHER): Payer: Medicare Other | Admitting: Family

## 2023-07-18 VITALS — BP 108/60 | HR 71 | Ht 62.0 in | Wt 173.9 lb

## 2023-07-18 DIAGNOSIS — I10 Essential (primary) hypertension: Secondary | ICD-10-CM | POA: Diagnosis not present

## 2023-07-18 DIAGNOSIS — E785 Hyperlipidemia, unspecified: Secondary | ICD-10-CM | POA: Diagnosis not present

## 2023-07-18 DIAGNOSIS — I480 Paroxysmal atrial fibrillation: Secondary | ICD-10-CM

## 2023-07-18 DIAGNOSIS — I25118 Atherosclerotic heart disease of native coronary artery with other forms of angina pectoris: Secondary | ICD-10-CM

## 2023-07-18 DIAGNOSIS — I447 Left bundle-branch block, unspecified: Secondary | ICD-10-CM | POA: Diagnosis not present

## 2023-07-18 NOTE — Patient Instructions (Addendum)
Medication Instructions:  Continue your current medications.   *If you need a refill on your cardiac medications before your next appointment, please call your pharmacy*  Testing/Procedures: Your EKG today was stable from previous which is a good result!  Follow-Up: At [facility], you and your health needs are our priority.  As part of our continuing mission to provide you with exceptional heart care, we have created designated Provider Care Teams.  These Care Teams include your primary Cardiologist (physician) and Advanced Practice Providers (APPs -  Physician Assistants and Nurse Practitioners) who all work together to provide you with the care you need, when you need it.  We recommend signing up for the patient portal called "MyChart".  Sign up information is provided on this After Visit Summary.  MyChart is used to connect with patients for Virtual Visits (Telemedicine).  Patients are able to view lab/test results, encounter notes, upcoming appointments, etc.  Non-urgent messages can be sent to your provider as well.   To learn more about what you can do with MyChart, go to [email or web address].    Your next appointment:   Recommend scheduling Lipid Clinic follow up with Dr. Rennis Golden sometime January to April 2025  Follow up in 1 year with Dr. Duke Salvia   Other Instructions  If you want to do repeat TB testing recommend discussing with primary care provider. Your lung sounds were clear on exam which is good!  Heart Healthy Diet Recommendations: A low-salt diet is recommended. Meats should be grilled, baked, or boiled. Avoid fried foods. Focus on lean protein sources like fish or chicken with vegetables and fruits. The American Heart Association is a Chief Technology Officer!  American Heart Association Diet and Lifeystyle Recommendations   Exercise recommendations: The American Heart Association recommends 150 minutes of moderate intensity exercise weekly. Try 30 minutes of moderate  intensity exercise 4-5 times per week. This could include walking, jogging, or swimming.

## 2023-07-18 NOTE — Progress Notes (Signed)
Cardiology Office Note:  .   Date:  07/18/2023  ID:  Margaretann Loveless, DOB Mar 25, 1956, MRN [id number] PCP: Charlton Amor, DO  [facility] Providers Cardiologist:  Chilton Si, MD  / Dr. Rennis Golden (lipid) Click to update primary MD,subspecialty MD or APP then REFRESH:1}   History of Present Illness: .   Pryscilla Amann is a 67 y.o. female with a hx of hypertension, coronary artery disease s/p MI in 2010 and PCI 2020, paroxysmal atrial fibrillation (not on anticoagulation due to hx of GI bleed), depression, LE edema due to venous insufficiency, LBBB.   She was evaluated in clinic August 2016 with recurrent chest pain.  She was started on metoprolol and referred for cardiac catheterization.  Cardiac catheterization August 2016 revealing 50% RCA lesion but otherwise minimal CAD.  Imdur was discontinued due to headache.  She had repeat angina and cath March 2020 found of 75% and 90% D1 lesions with successful PCI.     At visit 10/2021 BP and palpitations controlled. Referred to outpatient PT for gait instability. Saw Dr. Duke Salvia 07/01/22 with BP controlled and no angina. She saw Dr. Rennis Golden 01/28/23 for lipid follow up and Repatha funded through [employer] through 09/2023.   Presents today for follow up. Does note she had a stressful year but despite this, no recurrent chest pain. Reports occasional lightheadedness which she attributes to lack of sleep. No near syncope, syncope. Rare palpitations which are fleeting and not bothersome. Continue to work aprt time at [employer] which keeps her on her feet. Also exercises by gardening. Reports no shortness of breath nor dyspnea on exertion. No edema, orthopnea, PND.    ROS: Please see the history of present illness.    All other systems reviewed and are negative.   Studies Reviewed: [name]   EKG Interpretation Date/Time:  Monday July 18 2023 11:51:07 EST Ventricular Rate:  69 PR Interval:  178 QRS Duration:  160 QT Interval:  466 QTC  Calculation: 499 R Axis:   -18  Text Interpretation: Normal sinus rhythm Left bundle branch block No acute changes Confirmed by Gillian Shields ([id number]) on 07/18/2023 11:52:39 AM    Cardiac Studies & Procedures   CARDIAC CATHETERIZATION  CARDIAC CATHETERIZATION 10/09/2018  Narrative  Ost 2nd Diag to 2nd Diag lesion is 30% stenosed.  Prox RCA lesion is 20% stenosed.  Dist RCA lesion is 50% stenosed.  Ost 1st Diag lesion is 90% stenosed.  Prox Cx lesion is 10% stenosed.  1st Diag lesion is 75% stenosed.  A drug-eluting stent was successfully placed using a STENT SYNERGY DES 2.25X16. A drug-eluting stent was successfully placed using a STENT SYNERGY DES 2.25X8 for distal edge dissection.  Post intervention, there is a 0% residual stenosis.  The left ventricular systolic function is normal.  LV end diastolic pressure is normal.  The left ventricular ejection fraction is 55-65% by visual estimate.  There is no aortic valve stenosis.  DAPT for 6 months along with aggressive secondary prevention.  Plan for same day PCI.  Findings Coronary Findings Diagnostic  Dominance: Right  Left Main Vessel is large. Vessel is angiographically normal.  Left Anterior Descending Vessel is large. Vessel is angiographically normal. Tapers to a moderate caliber vessel as it wraps the apex.  First Diagonal Branch Vessel is moderate in size. Ost 1st Diag lesion is 90% stenosed. 1st Diag lesion is 75% stenosed.  First Septal Branch Vessel is small in size. Vessel is angiographically normal.  Second Diagonal Branch Vessel is moderate  in size. Ost 2nd Diag to 2nd Diag lesion is 30% stenosed. The lesion is tubular.  Second Septal Branch Vessel is small in size. Vessel is angiographically normal.  Left Circumflex Vessel is normal in caliber. Prox Cx lesion is 10% stenosed.  First Obtuse Marginal Branch Vessel is small in size.  Second Obtuse Marginal Branch Vessel is moderate in  size. The vessel exhibits minimal luminal irregularities.  Third Obtuse Marginal Branch Vessel is moderate in size.  Right Coronary Artery Vessel is moderate in size. Prox RCA lesion is 20% stenosed. The lesion is tubular. Dist RCA lesion is 50% stenosed. The lesion is tubular. At this point the vessel is small moderate caliber with distal flow only 2 small PDA and minimal lateral system.  Right Posterior Descending Artery Vessel is small in size. The vessel is tortuous. Very small caliber, tortuous vessel that does not reach any further than one half the way to the apex.  Inferior Septal Vessel is small in size.  Right Posterior Atrioventricular Artery Vessel is small in size.  Intervention  Ost 1st Diag lesion Stent CATHETER LAUNCHER 6FR EBU 3 guide catheter was inserted. Lesion crossed with guidewire using a WIRE ASAHI PROWATER 180CM. Pre-stent angioplasty was performed using a BALLOON SAPPHIRE 2.0X12. A drug-eluting stent was successfully placed using a STENT SYNERGY DES 2.25X16. Stent strut is well apposed. Post-stent angioplasty was performed using a BALLOON SAPPHIRE [place] 2.5X12. Likely edge dissection at distal edge. Post-Intervention Lesion Assessment The intervention was successful. Pre-interventional TIMI flow is 3. Post-intervention TIMI flow is 3. No complications occurred at this lesion. There is a 0% residual stenosis post intervention.  1st Diag lesion Stent Lesion crossed with guidewire using a WIRE HI TORQ BMW 190CM. A drug-eluting stent was successfully placed using a STENT SYNERGY DES 2.25X8. Stent strut is well apposed. Stent overlaps previously placed stent. Post-stent angioplasty was not performed. Post-Intervention Lesion Assessment The intervention was successful. Pre-interventional TIMI flow is 3. Post-intervention TIMI flow is 3. No complications occurred at this lesion. Placed for edge dissection at distal edge There is a 0% residual stenosis post  intervention.   CARDIAC CATHETERIZATION  CARDIAC CATHETERIZATION 03/25/2015  Narrative Images from the original result were not included. 1. Prox RCA lesion, 20% stenosed. 2. Dist RCA lesion, 50% stenosed. 3. Ost 2nd Diag to 2nd Diag lesion, 30% stenosed. 4. The left ventricular systolic function is normal.   Angiographically only minimal CAD in a moderate caliber RCA with very small caliber rPDA. Consider Coronary Vasospasm as a potential etiology.  Plan:  Post Radial Cath TR Band removal  D/C later today following Bed Rest  Start IMDUR 30 mg.   Patient's father notified via telephone per her request.   Marykay Lex, M.D., M.S. Interventional Cardiologist  Pager # [phone]  Findings Coronary Findings Diagnostic  Dominance: Right  Left Main The vessel was injected is large is angiographically normal. TIG 4.0 Catheter  Left Anterior Descending The vessel is large is angiographically normal. Tapers to a moderate caliber vessel as it wraps the apex.  First Diagonal Branch The vessel is moderate in size.  First Septal Branch The vessel is small in size and is angiographically normal.  Second Diagonal Branch The vessel is moderate in size. tubular .  Second Septal Branch The vessel is small in size and is angiographically normal.  Left Circumflex The vessel is normal in caliber .  First Obtuse Marginal Branch The vessel is small in size.  Second Obtuse Marginal Branch The vessel  is moderate in size and exhibits minimal luminal irregularities.  Third Obtuse Marginal Branch The vessel is moderate in size.  Right Coronary Artery The vessel was injected is moderate in size .  JR4 Catheter tubular . tubular .  At this point the vessel is small moderate caliber with distal flow only 2 small PDA and minimal lateral system.  Right Posterior Descending Artery The vessel is small in size. The vessel is tortuous. Very small caliber, tortuous vessel  that does not reach any further than one half the way to the apex.  Inferior Septal The vessel is small in size.  Right Posterior Atrioventricular Artery The vessel is small in size. Very small - AV Nodal artery  Intervention  No interventions have been documented.   STRESS TESTS  NM MYOCAR MULTI W/SPECT W 12/15/2008  Narrative Clinical Data:  Chest pain, hypertension  MYOCARDIAL IMAGING WITH SPECT (REST AND EXERCISE) GATED LEFT VENTRICULAR WALL MOTION STUDY LEFT VENTRICULAR EJECTION FRACTION  Technique:  Standard myocardial SPECT imaging was performed after resting intravenous injection of 10 mCi Tc-69m tetrofosmin. Subsequently, exercise tolerance test was performed by the patient under the supervision of the Cardiology staff.  At peak exercise, 30 mCi Tc-39m  tetrofosmin was injected intravenously and standard myocardial SPECT imaging was performed.  Quantitative gated imaging was also performed to evaluate left ventricular wall motion and estimate left ventricular ejection fraction.  Comparison:  None.  Findings:  Spect:  No definite inducible ischemia with exercise stress. Target heart rate 140.  Achieved heart rate 146.  Wall motion:  Normal wall motion and thickening  Ejection fraction:  Calculated Q G S ejection fraction is 71%.  IMPRESSION: Negative for inducible ischemia 71% ejection fraction  Provider: Jerrell Belfast  ECHOCARDIOGRAM  ECHOCARDIOGRAM COMPLETE 12/18/2019  Narrative ECHOCARDIOGRAM REPORT    Patient Name:   Margaretann Loveless  Date of Exam: 12/18/2019 Medical Rec #:  [id number]  Height:       62.0 in Accession #:    [id number] Weight:       194.9 lb Date of Birth:  07-11-1956  BSA:          1.891 m Patient Age:    64 years   BP:           126/79 mmHg Patient Gender: F          HR:           90 bpm. Exam Location:  Inpatient  Procedure: 2D Echo, Color Doppler and Cardiac Doppler  Indications:    R55 Syncope  History:        Patient  has no prior history of Echocardiogram examinations. CAD, Arrythmias:Atrial Fibrillation; Risk Factors:Hypertension and Dyslipidemia.  Sonographer:    Irving Burton Senior RDCS Referring Phys: [id number] Deno Lunger SHALHOUB  IMPRESSIONS   1. Left ventricular ejection fraction, by estimation, is 50%. The left ventricle has mildly decreased function. The left ventricle demonstrates regional wall motion abnormalities, septal-lateral dyssynchrony consistent with LBBB. Left ventricular diastolic parameters are consistent with Grade I diastolic dysfunction (impaired relaxation). 2. Right ventricular systolic function is normal. The right ventricular size is normal. Tricuspid regurgitation signal is inadequate for assessing PA pressure. 3. The mitral valve is normal in structure. No evidence of mitral valve regurgitation. No evidence of mitral stenosis. 4. The aortic valve is tricuspid. Aortic valve regurgitation is not visualized. Mild aortic valve sclerosis is present, with no evidence of aortic valve stenosis. 5. The inferior vena cava is normal in size with <50% respiratory  variability, suggesting right atrial pressure of 8 mmHg.  FINDINGS Left Ventricle: Left ventricular ejection fraction, by estimation, is 50%. The left ventricle has mildly decreased function. The left ventricle demonstrates regional wall motion abnormalities. The left ventricular internal cavity size was normal in size. There is no left ventricular hypertrophy. Left ventricular diastolic parameters are consistent with Grade I diastolic dysfunction (impaired relaxation).  Right Ventricle: The right ventricular size is normal. No increase in right ventricular wall thickness. Right ventricular systolic function is normal. Tricuspid regurgitation signal is inadequate for assessing PA pressure.  Left Atrium: Left atrial size was normal in size.  Right Atrium: Right atrial size was normal in size.  Pericardium: Trivial pericardial effusion  is present.  Mitral Valve: The mitral valve is normal in structure. No evidence of mitral valve regurgitation. No evidence of mitral valve stenosis.  Tricuspid Valve: The tricuspid valve is normal in structure. Tricuspid valve regurgitation is not demonstrated.  Aortic Valve: The aortic valve is tricuspid. Aortic valve regurgitation is not visualized. Mild aortic valve sclerosis is present, with no evidence of aortic valve stenosis.  Pulmonic Valve: The pulmonic valve was normal in structure. Pulmonic valve regurgitation is not visualized.  Aorta: The aortic root is normal in size and structure.  Venous: The inferior vena cava is normal in size with less than 50% respiratory variability, suggesting right atrial pressure of 8 mmHg.  IAS/Shunts: No atrial level shunt detected by color flow Doppler.   LEFT VENTRICLE PLAX 2D LVIDd:         4.60 cm  Diastology LVIDs:         3.20 cm  LV e' lateral:   7.72 cm/s LV PW:         1.40 cm  LV E/e' lateral: 11.1 LV IVS:        1.10 cm  LV e' medial:    7.29 cm/s LVOT diam:     1.90 cm  LV E/e' medial:  11.8 LV SV:         75 LV SV Index:   40 LVOT Area:     2.84 cm   RIGHT VENTRICLE RV S prime:     14.50 cm/s TAPSE (M-mode): 2.4 cm  LEFT ATRIUM             Index       RIGHT ATRIUM           Index LA diam:        3.70 cm 1.96 cm/m  RA Area:     13.70 cm LA Vol (A2C):   59.3 ml 31.36 ml/m RA Volume:   35.20 ml  18.62 ml/m LA Vol (A4C):   37.2 ml 19.67 ml/m LA Biplane Vol: 50.0 ml 26.44 ml/m AORTIC VALVE LVOT Vmax:   135.00 cm/s LVOT Vmean:  93.200 cm/s LVOT VTI:    0.264 m  AORTA Ao Root diam: 2.90 cm Ao Asc diam:  3.40 cm  MITRAL VALVE MV Area (PHT): 3.16 cm     SHUNTS MV Decel Time: 240 msec     Systemic VTI:  0.26 m MV E velocity: 85.70 cm/s   Systemic Diam: 1.90 cm MV A velocity: 109.00 cm/s MV E/A ratio:  0.79  Marca Ancona MD Electronically signed by Marca Ancona MD Signature Date/Time: 12/18/2019/4:04:56  PM    Final             Risk Assessment/Calculations:    CHA2DS2-VASc Score = 4   This indicates a 4.8% annual risk  of stroke. The patient's score is based upon: CHF History: 0 HTN History: 1 Diabetes History: 0 Stroke History: 0 Vascular Disease History: 1 (nonobstructive CAD) Age Score: 1 Gender Score: 1            Physical Exam:   VS:  BP 108/60   Pulse 71   Ht 5\' 2"  (1.575 m)   Wt 173 lb 14.4 oz (78.9 kg)   SpO2 95%   BMI 31.81 kg/m    Wt Readings from Last 3 Encounters:  07/18/23 173 lb 14.4 oz (78.9 kg)  02/02/23 168 lb 12 oz (76.5 kg)  01/28/23 174 lb (78.9 kg)    GEN: Well nourished, well developed in no acute distress NECK: No JVD; No carotid bruits CARDIAC:RRR, no murmurs, rubs, gallops RESPIRATORY:  Clear to auscultation without rales, wheezing or rhonchi  ABDOMEN: Soft, non-tender, non-distended EXTREMITIES:  No edema; No deformity   ASSESSMENT AND PLAN: .    HTN - BP well controlled in clinic today. Continue current antihypertensive regimen Amlodipine 2.5mg  daily, Losartan 50mg  daily.    PAF / Palpitations - Palpitations quiescent. Continue Metoprolol 12.5mg  BID. No OAC due to history of GI bleed.    CAD - Stable with no anginal symptoms. No indication for ischemic evaluation. Continue GDMT Plavix, Repatha, Metoprolol.    LBBB - Stable by EKG today. No near syncope, syncope. Continue to monitor with periodic EKG.    HLD, LDL goal <70 -Repatha per Dr. Rennis Golden.  03/2023 LDL 129 (previously 210 in 2022). Lp(a) negative. Has [employer] grant through 09/2023.  Intolerant to statin, Zetia.       Dispo: follow up in 1 year with Dr. Duke Salvia  Signed, Alver Sorrow, NP

## 2023-07-26 ENCOUNTER — Ambulatory Visit (INDEPENDENT_AMBULATORY_CARE_PROVIDER_SITE_OTHER): Payer: Medicare Other | Admitting: Licensed Clinical Social Worker

## 2023-07-26 DIAGNOSIS — F1020 Alcohol dependence, uncomplicated: Secondary | ICD-10-CM

## 2023-07-26 DIAGNOSIS — F411 Generalized anxiety disorder: Secondary | ICD-10-CM

## 2023-07-26 DIAGNOSIS — F0630 Mood disorder due to known physiological condition, unspecified: Secondary | ICD-10-CM
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
## 2023-07-28 ENCOUNTER — Telehealth (HOSPITAL_BASED_OUTPATIENT_CLINIC_OR_DEPARTMENT_OTHER): Payer: Self-pay | Admitting: Cardiovascular Disease

## 2023-07-28 NOTE — Telephone Encounter (Signed)
 Called pt to f/u on scheduling her next appointment and patient reported concerns about her TB history and recent fatigue that she connects to her history with Epstein-Barr. Patient requested that I pass this information along to the clinical team.

## 2023-08-04 DIAGNOSIS — M25511 Pain in right shoulder: Secondary | ICD-10-CM | POA: Diagnosis not present

## 2023-08-09 ENCOUNTER — Ambulatory Visit (INDEPENDENT_AMBULATORY_CARE_PROVIDER_SITE_OTHER): Payer: Medicare Other | Admitting: Licensed Clinical Social Worker

## 2023-08-09 DIAGNOSIS — F0630 Mood disorder due to known physiological condition, unspecified: Secondary | ICD-10-CM

## 2023-08-09 DIAGNOSIS — F1020 Alcohol dependence, uncomplicated: Secondary | ICD-10-CM

## 2023-08-09 DIAGNOSIS — F411 Generalized anxiety disorder: Secondary | ICD-10-CM | POA: Diagnosis not present

## 2023-08-09 NOTE — Progress Notes (Signed)
 THERAPIST PROGRESS NOTE  Session Time: 9:00 AM to 9:45 AM  Participation Level: Active  Behavioral Response: CasualAlertappropriate  Type of Therapy: Individual Therapy  Treatment Goals addressed: Work on pharmacologist for anxiety depression to decrease symptoms, work on mood management mood stabilization, coping patient is collaterally managing substance use issues through AA, coping  ProgressTowards Goals: Progressing-mood has improved patient has made some progress in implementing organizational strategies one of her goals has been reviewing history and she is starting to take steps to do that through writing about her experience and sharing them as well through stories  Interventions: Solution Focused, Biofeedback, Supportive, and Other: coping  Summary: Lindsay [name] is a 68 y.o. female who presents with has lists some days not motivated hurting don't move to later when dark can't see. Got a little bit done cleaning out closets.  When is the last time you used.this is the question she asked. Therapist like that approach. 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 Doesn't have access to art work. Slowing seeing progress cleaning downstairs feel good about that.  Some of the stories are amusing and some have interesting ending. Talked to customers sharing these stories always try to make it a surprise ending. One of the customers who she was telling the story to knows a professional story teller told this customer her wants to start writing and has this contact. The customer encouraged her to start by talking about it. Story telling teach her about organizing her thoughts to write these stories. Doesn't have anybody to talk to except customers and therapist.  Talking about customers at work who she interacts with sharing that experience with therapist.     Mores hours so feel better was very stretched for money. Doesn't go too high but around $300s.  Loves the job and the customers love her.  She has Niels had conversation what would they be doing if not doing what doing. 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 Became interested in history 10 th grade the teacher drew her in and whole subject interested her and drew her in. 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 Thought to herself live thousand lives and not read all the books struck her at 64, interested in preservation particularly restoring all books and costumes. Clothing from a different eras tapestry or needle work. 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 Always looking at restoring old buildings.  Engineering mind dad community education officer communicate through pictures he would sketch something out and patient able to see how it worked. Could be scientist, research (physical sciences) but hates math. Talking about this organizing her thoughts. Hates things now not good at likes things that she is good enough.  Reviewed session therapist noting specifics like mood better patient relates realizes forgetting to take Wellbutrin  has a system can see visually if taking.  Therapist sees the writing of stories can be beneficial to patient as well. At end of session talked about talking to pets stuffed animals we will start with that for more stories next time  Therapist checked in with patient about some of the issues we talked about last time mood is better she said helpful that getting paid more also realize she was not taking well did not consistently and has a system for that.  But patient really gets out of our sessions therapist believes is the interaction she has  limited opportunities so the engagement in conversation as she shares about herself about her past things she has also wanted to do in sessions to piece together past helps with mood.  She herself is working on that to her ideas of writing about her family therapist noted this is  what she wanted to do when she started therapy so helpful for that goal.  She even is pursuing how to do this looking into someone who can teach her more about storytelling but encouraged to just start talking about the stories.  Patient is making some progress on her organizational strategies slowly doing positive things like organizing her closet labeling things.  As always sessions are interesting with patient's various interests that can go from origin of the language.  Today was interesting to talk about what she would be doing if she did not choose her career and again showing many different interest that include research reading interest in engineering.  This to therapist helps with supportive and strength-based intervention.  Suicidal/Homicidal: No  Plan: Return again in 1 week.2.  Continue to track progress with organizational skills, mood start next session talk about more stories this time her cats and stuffed animals  Diagnosis: Mood disorder in conditions classified elsewhere, Generalized  anxiety disorder, severe alcohol use  disorder  Collaboration of Care: Other none needed  Patient/Guardian was advised Release of Information must be obtained prior to any record release in order to collaborate their care with an outside provider. Patient/Guardian was advised if they have not already done so to contact the registration department to sign all necessary forms in order for us  to release information regarding their care.   Consent: Patient/Guardian gives verbal consent for treatment and assignment of benefits for services provided during this visit. Patient/Guardian expressed understanding and agreed to proceed.   Ronal Sink, LCSW 08/09/2023

## 2023-08-16 ENCOUNTER — Ambulatory Visit (INDEPENDENT_AMBULATORY_CARE_PROVIDER_SITE_OTHER): Payer: Medicare Other | Admitting: Licensed Clinical Social Worker

## 2023-08-16 DIAGNOSIS — F0630 Mood disorder due to known physiological condition, unspecified: Secondary | ICD-10-CM | POA: Diagnosis not present

## 2023-08-16 DIAGNOSIS — F1020 Alcohol dependence, uncomplicated: Secondary | ICD-10-CM | POA: Diagnosis not present

## 2023-08-16 DIAGNOSIS — F411 Generalized anxiety disorder: Secondary | ICD-10-CM | POA: Diagnosis not present

## 2023-08-16 NOTE — Progress Notes (Signed)
THERAPIST PROGRESS NOTE  Session Time: 9:00 AM to 9:47 AM  Participation Level: Active  Behavioral Response: CasualAlertEuthymic  Type of Therapy: Individual Therapy  Treatment Goals addressed: Work on Pharmacologist for anxiety depression to decrease symptoms, work on mood management mood stabilization, coping patient is collaterally managing substance use issues through AA, coping  ProgressTowards Goals: Progressing-talked about different very interesting topics showing patient strengths also shows willingness to engage in her projects helpful for mood  Interventions: Solution Focused, Strength-based, Supportive, and Other: coping  Summary: Lindsay [name] is a 68 y.o. female who presents with Friday after work at [employer] for a few items after clocked out. Ended up talking to a lady for 45 minutes her name is Alvino Chapel enjoyed it so much decided to get together. Gave patient her number.  Coincidentally she came to work yesterday. Told patient to call her when get home.  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  Talked about her interest in art knows a lot but still new things to learn.   Talking about styling hair.  Going for hair cut today Enjoy right now a book reading. Likes science fiction how cogent their ideas are now.  Scary what is going on today in the world.  How she copes she says be in a Buddha space great to be the moment dive into beauty around her all have is the moment. Everything else is illusion. Reality but what think about it is illusion think we have control of future don't dive in past nothing we can do about it except punish ourselves. Anxious stare at the wall and breath or look out the window take in moment and stay in the moment and breath. Stare at the cat. Think about mistakes what can do just don't do it again.  Problems with  numbers brain patient believes has deteriorated like mom she believes. Makes a budget and over spend [name] has had to help don't have the money to pay back. She is getting sick of bailing her out.   Talked about helpful mindset for anxiety patient says has to remember therapist note this is normal we forget and then remind ourselves  self-awareness work on but always have to bring ourselves back with mindfulness.  Assess therapeutic session simply by different topics covered patient is an Tree surgeon the topics are inspiring, mood enhancing talking about projects she wants to do for our, talking about different books about paintings all very inspirational.  A positive she shared his meaning somebody where she works has made a friend plans to meet up will share with therapist how that goes but positive to be connecting with people.  We talked about stress of daily life now what is happening in the world patient shared some good tips about putting herself in a Buddhist space, breathing fixing her gaze on something therapist found this very helpful as can be challenging in life right now when there is things we cannot control.  Patient noted the philosophy is all we have is the moment again helpful.  Suicidal/Homicidal: No  Plan: Return again in 1 week.2.  Continue to talk about patient's very interest emphasizing her strengths therapist being supportive encouraged her with her projects and her skills that also help with mood  Diagnosis: Mood disorder in conditions classified elsewhere, Generalized  anxiety disorder, severe alcohol use  disorder   Collaboration of Care: Other none needed  Patient/Guardian was advised Release of Information must be obtained prior to any record release in order to collaborate their care with an outside provider. Patient/Guardian was advised if they have not already done so to contact the registration department to sign all necessary forms in order for [place] to release information  regarding their care.   Consent: Patient/Guardian gives verbal consent for treatment and assignment of benefits for services provided during this visit. Patient/Guardian expressed understanding and agreed to proceed.   Coolidge Breeze, LCSW 08/16/2023

## 2023-08-23 ENCOUNTER — Ambulatory Visit (INDEPENDENT_AMBULATORY_CARE_PROVIDER_SITE_OTHER): Payer: Medicare Other | Admitting: Licensed Clinical Social Worker

## 2023-08-23 DIAGNOSIS — F1020 Alcohol dependence, uncomplicated: Secondary | ICD-10-CM | POA: Diagnosis not present

## 2023-08-23 DIAGNOSIS — F0630 Mood disorder due to known physiological condition, unspecified: Secondary | ICD-10-CM

## 2023-08-23 DIAGNOSIS — F411 Generalized anxiety disorder: Secondary | ICD-10-CM
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
## 2023-08-30 ENCOUNTER — Ambulatory Visit (INDEPENDENT_AMBULATORY_CARE_PROVIDER_SITE_OTHER): Payer: Medicare Other | Admitting: Licensed Clinical Social Worker

## 2023-08-30 DIAGNOSIS — F0630 Mood disorder due to known physiological condition, unspecified: Secondary | ICD-10-CM | POA: Diagnosis not present

## 2023-08-30 DIAGNOSIS — F411 Generalized anxiety disorder: Secondary | ICD-10-CM

## 2023-08-30 DIAGNOSIS — F1020 Alcohol dependence, uncomplicated: Secondary | ICD-10-CM

## 2023-08-30 NOTE — Progress Notes (Signed)
 THERAPIST PROGRESS NOTE  Session Time: 1:00 PM to 1:50 PM  Participation Level: Active  Behavioral Response: CasualAlertappropriate  Type of Therapy: Individual Therapy  Treatment Goals addressed:  Work on pharmacologist for anxiety depression to decrease symptoms, work on mood management, mood stabilization, coping in general, patient is collaterally managing substance use issues through AA, coping, address significant stressor of state of her house and home repair is really necessary but cannot afford the repairs, severe type issues such as black mold.  ProgressTowards Goals: Progressing-continue to look at impact of patient's earlier life on current symptoms patient reflecting on that light sharing with therapist as well as insights to help with present coping  Interventions: Solution Focused, Strength-based, Supportive, and Other: Coping  Summary: Jatoria Kneeland is a 68 y.o. 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 As deep as house and 20 ' wide. 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  Thought it was a water heater replaced it still the problem found out it was septic tank that was floating the washer and in 2019 sceptic tank replaced nothing more can do. It was a catastrophe explored what she could do at this point tear her out sheet rock and remove the installation. Could have done when younger. Sitting there for many years flooring upstairs show signs of moisture damage from exterior wall roofing or guttering or both. .  After our last session patient had more good things remembers about Dad. Comes from a bright family. Didn't get along with grandmother didn't treat mom right. Favored his twin. Felt bad for mom father was offered opportunities to advance himself tap into chiropractor with engineering side.  Scholarship at [employer]. Afraid of failure. He didn't go. How impacted her?  He also could put patient down when could could not treat her mom right.  Thinks he was a coward and  overcame with JFK, personality and patient does it put on the smile work hard and be a copy and gets in the way.  One day last week money problems.  Not making any money there is not support to help her.  She did speak up on for 2 days and we brainstorm about other working possibilities.   Patient began to review past history again she relates changed in [place] . Remembers two things trip to [employer] he was doing well. Invited to party. This guy made an overture to Dad.  Patient describes [place]  in general as weight of substance another party at apartments never heard of apartments. A man was smarmy.  People got drunk patient did not like being around drunk people got annoyed with their behavior.  Disconcerting patient asked to leave they left but not too early so not rude.   Reviewed insights from session patient says reluctance and fear of failure patient always had inherited and holding her back now. Trouble cleaning space, details perfectionist.  It is about messages she got and how she interpreted them she was like mom and dad mom did not get so much of the detrimental side.   Reviewed treatment plan same goals although patient wants to add dealing with significant stressor of state of her house repair is really necessary but cannot afford them severe type issues such as black mold.  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  We also talked about her doing at homework that would be easier on her body doing 1 job she heard about at [employer].  Patient said today she is remembering more positive things about her dad not just the negative shared some of the positive how we bill different things for her.  Patient able  to see that she is doing what her dad did he was talented yet worried about being a failure she says she inherited this from him therapist also thinks things were presented to her with her own filter interpretation and that is where we have to worked to change the filter interpretation to something more helpful and accurate.  Work on what read definition that is accurate about herself.  Therapist assesses things like perfectionism probably also played a part in mindset perhaps failure was not acceptable and would be too hard if it happen as 1 possibility.  Patient also putting pieces of her life together to make sense of things noted [place]  was when things changed for the worse.  Therapist wants to present the idea that patient's perspective is complicated there are good and bad in her father and actually strengths that she sees the complexity healthy part of her personality to see the good and bad not there always been had good qualities even though could behave badly support her in understanding the complex role her father played sees psychologically protecting her memory and her role father even though at times felt short other times can recognize the positive experiences.  Therapist main goal though is helping patient change filters impacted by the past so no longer have a negative impact on her. Suicidal/Homicidal: No  Plan: Return again in 1 week.2.  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 Patient/Guardian was advised if they have not already done so to contact the registration department to sign all necessary forms in order for us  to release information regarding their care.   Consent: Patient/Guardian gives verbal consent for treatment and assignment of benefits for services provided during this visit. Patient/Guardian expressed understanding and agreed to proceed.   Ronal Sink, LCSW 08/30/2023

## 2023-09-04 ENCOUNTER — Other Ambulatory Visit (HOSPITAL_COMMUNITY): Payer: Self-pay | Admitting: Psychiatry

## 2023-09-06 ENCOUNTER — Ambulatory Visit (HOSPITAL_COMMUNITY): Payer: Medicare Other | Admitting: Licensed Clinical Social Worker

## 2023-09-06 ENCOUNTER — Ambulatory Visit (INDEPENDENT_AMBULATORY_CARE_PROVIDER_SITE_OTHER): Payer: Medicare Other | Admitting: Licensed Clinical Social Worker

## 2023-09-06 ENCOUNTER — Telehealth (HOSPITAL_COMMUNITY): Payer: Self-pay | Admitting: *Deleted

## 2023-09-06 DIAGNOSIS — M1711 Unilateral primary osteoarthritis, right knee: Secondary | ICD-10-CM | POA: Diagnosis not present

## 2023-09-06 DIAGNOSIS — F1020 Alcohol dependence, uncomplicated: Secondary | ICD-10-CM

## 2023-09-06 DIAGNOSIS — F411 Generalized anxiety disorder: Secondary | ICD-10-CM

## 2023-09-06 DIAGNOSIS — F0630 Mood disorder due to known physiological condition, unspecified: Secondary | ICD-10-CM | POA: Diagnosis not present

## 2023-09-06 MED ORDER — GABAPENTIN 100 MG PO CAPS: 100.0000 mg | ORAL_CAPSULE | Freq: Two times a day (BID) | ORAL | 1 refills | Status: DC

## 2023-09-06 NOTE — Progress Notes (Addendum)
THERAPIST PROGRESS NOTE  Session Time: 1:05 PM to 1:49 PM  Participation Level: Active  Behavioral Response: CasualAlertappropriate  Type of Therapy: Individual Therapy  Treatment Goals addressed: Work on Pharmacologist for anxiety depression to decrease symptoms, work on mood management, mood stabilization, coping in general, patient is collaterally managing substance use issues through AA, coping, address significant stressor of state of her house and home repair is really necessary but cannot afford the repairs, severe type issues such as black mold.  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 female who presents with therapist bringing up topics she want to explore such as patient having a community would be helpful ongoing difficulty just with finances and being able to get places.  [employer] is a community for her.  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 Easily shipped item.  Therapist after seeing her talent very encouraging with this. She is thinking constantly about the technique going to use. 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 Overwhelming when look at it but "clawing away" washing, disinfecting. Make food ahead so doesn't have to worry about the sink. Both knees going, lower back serious problems, luckily appointment with Dr. Ethelene Hal today, going to have MRI for right  shoulder, right arm improved in terms of pain, permanent pulled muscle in back have to support right arm with left.    We did for a moment revert to talking about past therapy session her dad the same way she does over coming fear by a front exhausting. When want a drink when come home at night, exhausted, hurt doesn't have mental energy to cook when get home. Identify weak spots. Toilet collapse don't know how to fix it. Looking at programs people of certain median income to help her with this. May take a year to get them in.  Talk about current situation anxiety provoking things. On top of toilet collapse, patient says significant financial struggles.. She says gets $1800.00 social security after take out [employer]. Out of that has to take $380 for bankruptcy. What happens after done with bankruptcy can't find out. Still owes people.  Talked about her session patient said helpful to relieve stress.  Therapist noted some positive patient cleaning brushes picking them up patient showed pictures and therapist was impressed wondered if that was connected to therapy and patient said yes helping therapist can see some positive results. She has ongoing stressors that are very challenging mainly due to financial issues and also medical issues.  Therapist validating attempting to problem solve with her she is looking into programs herself to see if she can get help on a toilet that is falling through the floor therapist noted this is dealing with difficult stressors.  Patient showed therapist pictures of family, of her art work, therapist very impressed with her talent feels that could be shown in an exhibition very creative for example art work on [employer] well thought out that  she had a figure out how to do it.  Patient showed pictures when younger assess pictures as a way to connect and communicate specially when we are talking about her history getting to know each other getting to know her  means also seeing pictures of her.  Patient working on art projects to help build money but talked about limited finances being challenging.  Also talked about physical issues being challenging.  Noted current state of things in the country the uncertainty and things falling apart is anxiety provoking and patient adds to this the stress compounded when her toilet is following through the floor where she can come up with money.  Assessed therapy sessions developing relationship very helpful for therapeutic interventions.  Patient says able to let off some steam therapist agrees laughing helping with that aspect.  Provider use empathetic listening direct questioning discussed current and underlying problems do want to continue to explore the past to help patient with current issues. Suicidal/Homicidal: No  Plan: Return again in 1 week.2.  Review current stressors have patient share pictures as helpful for therapy also continue to focus on the past ways patient is like her father and worried about being a failure how she has her own filter related to events and the past and there may be some distortions unhelpful ones also identifying patient's healthy perspective the past that is not black-and-white but more complex  Diagnosis: Mood disorder in conditions classified elsewhere, Generalized  anxiety disorder, severe alcohol use  disorder  Collaboration of Care: Other none needed  Patient/Guardian was advised Release of Information must be obtained prior to any record release in order to collaborate their care with an outside provider. Patient/Guardian was advised if they have not already done so to contact the registration department to sign all necessary forms in order for [place] to release information regarding their care.   Consent: Patient/Guardian gives verbal consent for treatment and assignment of benefits for services provided during this visit. Patient/Guardian expressed understanding and agreed to  proceed.   Coolidge Breeze, LCSW 09/06/2023

## 2023-09-06 NOTE — Addendum Note (Signed)
Addended by: Thresa Ross on: 09/06/2023 02:31 PM   Modules accepted: Orders

## 2023-09-06 NOTE — Telephone Encounter (Signed)
Only  2 Capsules  left -- next appt Thursday 09/07/22  [id number] - [place], [place] - [address]   gabapentin (NEURONTIN) 100 MG capsule

## 2023-09-12 NOTE — Therapy (Signed)
OUTPATIENT PHYSICAL THERAPY LOWER EXTREMITY EVALUATION   Patient Name: Caled[place] Zou MRN: [id number] DOB:14-Jan-1956, 68 y.o., female Today's Date: 09/13/2023  END OF SESSION:  PT End of Session - 09/13/23 1018     Visit Number 1    Number of Visits 13    Date for PT Re-Evaluation 10/25/23    Authorization Type Medicare    Progress Note Due on Visit 10    PT Start Time 1018    PT Stop Time 1059    PT Time Calculation (min) 41 min    Activity Tolerance Patient tolerated treatment well             Past Medical History:  Diagnosis Date   Abnormal mammogram    Thick Tissue   Acquired trigger finger of right ring finger 04/20/2022   Arthritis    Bilateral wrist pain 04/20/2022   Bipolar 1 disorder    Cataract    OU   Cervical radiculopathy 10/14/2021   Chalazion of left lower eyelid 04/15/2022   Chest pain 02/11/2015   Chronic atrial fibrillation 09/08/2015   Colon polyp    Compressed cervical disc    Coronary artery disease involving native coronary artery with angina pectoris 03/24/2015   Dorsalgia 09/22/2018   Dysphagia 09/22/2018   Essential hypertension 03/03/2015   Generalized anxiety disorder    GERD without esophagitis 09/22/2018   Hematemesis 12/18/2019   Hepatic steatosis 04/22/2022   History of appendectomy 1963   Hypertensive retinopathy    OU   Irritable bowel syndrome without diarrhea 09/22/2018   Lactic acidosis 12/18/2019   LLQ pain 04/15/2022   Major depressive disorder    Migraine without status migrainosus, not intractable 09/22/2018   Mixed hyperlipidemia 05/07/2016   Non-ST elevation MI (NSTEMI) 07/26/2008   No cardiac catheterization at the time.   Obstructive sleep apnea    no CPAP use   Orthostasis    Osteoarthritis of carpometacarpal (CMC) joint of thumb 04/20/2022   Osteoarthritis of knee 09/22/2018   PAF (paroxysmal atrial fibrillation) 10/09/2018   Pain in joint of right hip 07/03/2020   Pain in right knee 12/14/2018   Recurrent  syncope    Rib pain 07/03/2020   Sciatica of right side 09/22/2018   Severe alcohol use disorder    Vitamin D deficiency    Past Surgical History:  Procedure Laterality Date   APPENDECTOMY     CARDIAC CATHETERIZATION N/A 03/25/2015   Procedure: Left Heart Cath and Coronary Angiography;  Surgeon: Marykay Lex, MD;  Location: [facility] INVASIVE CV LAB;  Service: Cardiovascular;  Laterality: N/A;   CORONARY STENT INTERVENTION N/A 10/09/2018   Procedure: CORONARY STENT INTERVENTION;  Surgeon: Corky Crafts, MD;  Location: [facility] INVASIVE CV LAB;  Service: Cardiovascular;  Laterality: N/A;   DILATATION & CURETTAGE/HYSTEROSCOPY WITH MYOSURE N/A 05/19/2016   Procedure: DILATATION & CURETTAGE/HYSTEROSCOPY WITH MYOSURE;  Surgeon: Geryl Rankins, MD;  Location: [facility];  Service: Gynecology;  Laterality: N/A;  Possible Myosure for polyps.   EYE SURGERY     LEFT HEART CATH AND CORONARY ANGIOGRAPHY N/A 10/09/2018   Procedure: LEFT HEART CATH AND CORONARY ANGIOGRAPHY;  Surgeon: Corky Crafts, MD;  Location: [facility] INVASIVE CV LAB;  Service: Cardiovascular;  Laterality: N/A;   MOUTH SURGERY     URETHRAL DILATION     Patient Active Problem List   Diagnosis Date Noted   Stress 02/02/2023   Insomnia 12/23/2022   Blood clotting disorder (HCC) 10/28/2022   Neck pain 10/27/2022   Acute  thoracic back pain 10/27/2022   Lumbar radiculopathy 05/26/2022   Degeneration of lumbar intervertebral disc 05/26/2022   Major depressive disorder    Severe alcohol use disorder    Generalized anxiety disorder    Obstructive sleep apnea    Hepatic steatosis 04/22/2022   Osteoarthritis of carpometacarpal (CMC) joint of thumb 04/20/2022   Acquired trigger finger of right ring finger 04/20/2022   Bilateral wrist pain 04/20/2022   Chalazion of left lower eyelid 04/15/2022   Bipolar 1 disorder (HCC)    Cervical radiculopathy 10/14/2021   Pain in joint of right hip 07/03/2020   Rib pain 07/03/2020   Recurrent syncope     Orthostasis    Lactic acidosis 12/18/2019   Hematemesis 12/18/2019   Pain in right knee 12/14/2018   PAF (paroxysmal atrial fibrillation) 10/09/2018   Dorsalgia 09/22/2018   Dysphagia 09/22/2018   GERD without esophagitis 09/22/2018   Irritable bowel syndrome without diarrhea 09/22/2018   Osteoarthritis of knee 09/22/2018   Polypharmacy 09/22/2018   Sciatica of right side 09/22/2018   Vitamin D deficiency 09/22/2018   Mixed hyperlipidemia 05/07/2016   Chronic atrial fibrillation (HCC) 09/08/2015   Coronary artery disease involving native coronary artery with angina pectoris 03/24/2015   Essential hypertension 03/03/2015   Chest pain 02/11/2015   Non-ST elevation MI (NSTEMI) (HCC) 07/26/2008    PCP: Chilton Si, MD  REFERRING PROVIDER: Elvin So, PA-C  REFERRING DIAG: [phone] (ICD-10-CM) - Pain in right knee  THERAPY DIAG:  Chronic pain of both knees  Difficulty in walking, not elsewhere classified  Muscle weakness (generalized)  Rationale for Evaluation and Treatment: Rehabilitation  ONSET DATE: 3 years ago  SUBJECTIVE:   SUBJECTIVE STATEMENT: Pt reports ~3 year history of BIL knee pain, R>L. Does not endorse any specific injury, reports this was preceded by job with more time on feet. States symptoms slowly seem to be worsening, knee will occasionally buckle. Tends to fluctuate, oftentimes based on work activities. Has fluctuating swelling as well through lateral R knee. She also endorses history of neck/UE pain, cramping in legs and arms for which she states she is in communication w/ providers. Also mentions a R shoulder subluxation a couple months ago, states she will be getting an MRI soon. Also reports seeing counselor weekly for mental health hx.   PERTINENT HISTORY: BPD1, afib, CAD w/ chest pain, dysphagia, HTN, GAD, depression, migraine, NSTEMI, OSA, recurrent syncope  PAIN:  Are you having pain: 2-3/10 Location/description: R>L knee,  posterior and anterior Best-worst over past week: 2-7/10  - aggravating factors: walking, standing ,stairs - Easing factors: pain ointment, pain medication  PRECAUTIONS: cardiac hx     WEIGHT BEARING RESTRICTIONS: No  FALLS:  Has patient fallen in last 6 months? No  LIVING ENVIRONMENT: Uses cane PRN for balance Lives w/ cats; lives in split level, 2 sets of stairs - BIL rails, 6-8 vs 8-10 steps on each  OCCUPATION: cashier  PLOF: Independent - enjoys gardening, art  PATIENT GOALS: build strength/muscle, get back to art  NEXT MD VISIT: TBD  OBJECTIVE:  Note: Objective measures were completed at Evaluation unless otherwise noted.  DIAGNOSTIC FINDINGS:  No recent imaging in chart - pt states she has had XR, endorses some degenerative changes   PATIENT SURVEYS:  Deferred given time constraints  COGNITION: Overall cognitive status: Within functional limits for tasks assessed     SENSATION: NT  EDEMA:  Pt endorses fluctuating edema in lateral R knee    LOWER EXTREMITY ROM:  Right eval Left eval  Hip flexion    Hip extension    Hip internal rotation    Hip external rotation    Knee extension A: full   A: full  Knee flexion A: 119 deg   A: 124 deg   (Blank rows = not tested) (Key: WFL = within functional limits not formally assessed, * = concordant pain, s = stiffness/stretching sensation, NT = not tested)  Comments:    LOWER EXTREMITY MMT:    MMT Right eval Left eval  Hip flexion 4 4  Hip abduction (modified sitting) 5 5  Hip internal rotation    Hip external rotation    Knee flexion 4+ 5  Knee extension 4+ 5  Ankle dorsiflexion 4+ 5   (Blank rows = not tested) (Key: WFL = within functional limits not formally assessed, * = concordant pain, s = stiffness/stretching sensation, NT = not tested)  Comments:    LOWER EXTREMITY SPECIAL TESTS:  Deferred given time constraints  FUNCTIONAL TESTS:  5xSTS: 11.69sec w/ mild pain  TUG: 10.27sec  w/o AD, antalgic gait  GAIT: Distance walked: within clinic Assistive device utilized: None Level of assistance: Complete Independence Comments: antalgic gait R>L, reduced gait speed/cadence                                                                                                                                TREATMENT DATE:  [facility] Adult PT Treatment:                                                DATE: 09/13/23 Deferred given time constraints   PATIENT EDUCATION:  Education details: Pt education on PT impairments, prognosis, and POC. Informed consent. Rationale for interventions Person educated: Patient Education method: Explanation, Demonstration, Tactile cues, Verbal cues Education comprehension: verbalized understanding, returned demonstration, verbal cues required, tactile cues required, and needs further education    HOME EXERCISE PROGRAM: Deferred given time constraints  ASSESSMENT:  CLINICAL IMPRESSION: Patient is a pleasant 68 y.o. woman who was seen today for physical therapy evaluation and treatment for R knee pain, although she endorses bilateral issues. Ongoing for past few years, requires standing/lifting for work activities. Reports some effect on her gait mechanics, symptoms fluctuating typically around work activities. On exam she demonstrates mild R LE weakness compared to L although both knees demonstrate good ROM. TUG and 5xSTS are not within cutoff score for fall risk although she may benefit from higher level postural stability assessment (I.e FGA/DGI) given altered mechanics. No adverse events, deferring subjective outcome measure and HEP given time constraints, plan to administer as appropriate. Recommend trial of skilled PT to address aforementioned deficits with aim of improving functional tolerance and reducing pain with typical activities. Pt departs today's session in no acute distress, all voiced concerns/questions  addressed appropriately from PT  perspective.      OBJECTIVE IMPAIRMENTS: Abnormal gait, decreased activity tolerance, decreased balance, decreased endurance, decreased mobility, difficulty walking, decreased ROM, decreased strength, and pain.   ACTIVITY LIMITATIONS: carrying, lifting, bending, standing, squatting, sleeping, stairs, transfers, and locomotion level  PARTICIPATION LIMITATIONS: meal prep, cleaning, laundry, community activity, and occupation  PERSONAL FACTORS: Age, Time since onset of injury/illness/exacerbation, and 3+ comorbidities: BPD1, afib, CAD w/ chest pain, dysphagia, HTN, GAD, depression, migraine, NSTEMI, OSA, recurrent syncope  are also affecting patient's functional outcome.   REHAB POTENTIAL: Good  CLINICAL DECISION MAKING: Evolving/moderate complexity  EVALUATION COMPLEXITY: Moderate   GOALS:  SHORT TERM GOALS: Target date: 10/04/2023 Pt will demonstrate appropriate understanding and performance of initially prescribed HEP in order to facilitate improved independence with management of symptoms.  Baseline: HEP TBD  Goal status: INITIAL   2. Pt will report at least 25% improvement in overall pain levels over past week in order to facilitate improved tolerance to typical daily activities.   Baseline: 2-7/10  Goal status: INITIAL   LONG TERM GOALS: Target date: 10/25/2023 Pt will improve MCID or greater on LEFS in order to demonstrate improved perception of function due to symptoms (MCID 9 pts) Baseline: LEFS TBD Goal status: INITIAL  2.  Pt will demonstrate 5/5 MMT BIL knees/ankles for improved functional strength.  Baseline: See MMT chart above Goal status: INITIAL  3.  Pt will report no more than 4/10 knee pain on NPS with average work day in order to facilitate improved tolerance to work tasks.  Baseline: increased pain at end of work day, up to 7/10 Goal status: INITIAL  4.  Pt will be able to perform 5xSTS in less than or equal to 9sec in order to demonstrate reduced fall risk and  improved functional independence (MCID 5xSTS = 2.3 sec). Baseline: 11sec Goal status: INITIAL   5. Pt will report at least 50% decrease in overall pain levels in past week in order to facilitate improved tolerance to basic ADLs/mobility.   Baseline: 2-7/10  Goal status: INITIAL    6. Pt will demonstrate appropriate performance of final prescribed HEP in order to facilitate improved self-management of symptoms post-discharge.   Baseline: HEP TBD  Goal status: INITIAL     PLAN:  PT FREQUENCY: 1-2x/week  PT DURATION: 6 weeks  PLANNED INTERVENTIONS: 97164- PT Re-evaluation, 97110-Therapeutic exercises, 97530- Therapeutic activity, 97112- Neuromuscular re-education, 97535- Self Care, 32440- Manual therapy, [phone]- Gait training, Patient/Family education, Balance training, Stair training, Taping, Dry Needling, Joint mobilization, Cryotherapy, and Moist heat  PLAN FOR NEXT SESSION: establish HEP. Administer LEFS, could consider DGI/FGA. Focus on quad/hamstring motor control in open and closed chain, functional mechanics. Symptom modification strategies as indicated/appropriate, mindful of cardiac history.    Ashley Murrain PT, DPT 09/13/2023 12:20 PM

## 2023-09-13 ENCOUNTER — Other Ambulatory Visit: Payer: Self-pay

## 2023-09-13 ENCOUNTER — Ambulatory Visit: Payer: Medicare Other | Attending: Physician Assistant | Admitting: Physical Therapy

## 2023-09-13 ENCOUNTER — Ambulatory Visit (INDEPENDENT_AMBULATORY_CARE_PROVIDER_SITE_OTHER): Payer: Medicare Other | Admitting: Licensed Clinical Social Worker

## 2023-09-13 ENCOUNTER — Encounter: Payer: Self-pay | Admitting: Physical Therapy

## 2023-09-13 DIAGNOSIS — M25561 Pain in right knee: Secondary | ICD-10-CM | POA: Insufficient documentation

## 2023-09-13 DIAGNOSIS — F1020 Alcohol dependence, uncomplicated: Secondary | ICD-10-CM | POA: Diagnosis not present

## 2023-09-13 DIAGNOSIS — R262 Difficulty in walking, not elsewhere classified: Secondary | ICD-10-CM | POA: Diagnosis not present

## 2023-09-13 DIAGNOSIS — M6281 Muscle weakness (generalized): Secondary | ICD-10-CM | POA: Diagnosis not present

## 2023-09-13 DIAGNOSIS — M25562 Pain in left knee: Secondary | ICD-10-CM | POA: Diagnosis not present

## 2023-09-13 DIAGNOSIS — F0630 Mood disorder due to known physiological condition, unspecified: Secondary | ICD-10-CM

## 2023-09-13 DIAGNOSIS — F411 Generalized anxiety disorder: Secondary | ICD-10-CM

## 2023-09-13 DIAGNOSIS — G8929 Other chronic pain: Secondary | ICD-10-CM | POA: Insufficient documentation

## 2023-09-13 NOTE — Progress Notes (Signed)
THERAPIST PROGRESS NOTE  Session Time: 11:00 AM to 11:51 AM  Participation Level: Active  Behavioral Response: CasualAlertEuthymic  Type of Therapy: Individual Therapy  Treatment Goals addressed: work on coping skills for anxiety depression to decrease symptoms, work on mood management, mood stabilization, coping in general, patient is collaterally managing substance use issues through AA, coping, address significant stressor of state of her house and home repair is really necessary but   ProgressTowards Goals: Progressing-patient sharing updates about moving forward with working on her house the signs of progress finding ways to organize herself to help that, patient setting reasonable goals working toward assess helpful for her mood particularly her talents and art and using that in a productive way  Interventions: Solution Focused, Strength-based, Supportive, and Other: coping  Summary: Lindsay [name] is a 68 y.o. 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  Turn to different aspects of personality patient says tell people what she thinks as way of bringing job and gratitude that keeps fear away. Easily swallowed by fear and dread. End of benefiting from it.  Therapist noted this is the way she believes were supposed to be kind to people patient added that is with the Buddha said.  Talked about the state of the world. Going with the flow has been looking at reporters online. Information coming through that understands.  Patient talked in ways that seem to put herself in some of these dictator issues, sit feel to always feel need to acquire things, to be pushed into a corner, their philosophy or they are terrified empty despair, lonely.patient said where we have to be concerned is where 1 tries to get up the other person where it can get dangerous.  These religions brain wash people with no experience haven't had experience outside of a tiny life.  Patient relates has her own experience from Dad part of [employer]-their particulate one was [employer]. The book they focused on was the  "[place]".  Patient experience from her experience you know it is cult if say it and then deny it.   Looking at history also involved educating herself about politics religion.  Because of the cult her Dad was in  stimulated her learning about things. Duplicity of Bible study course answer the way they want studied the history of religion.  Therapist identified she was engaged in critical thinking about different subjects including economics, politics, religion better research who wrote where studied. Find these things out how they are influenced.  Patient describes bolts out of the blue.  Therapist likes how relates to how she has experienced life. Patient went on to explain what she meant first Lindsay [name] died in her mom's womb because of her doctor. Lindsay [name] problems because of the same doctor. Country doctor took care of her fine "was an accident".  These bolts of blue or strokes of good luck can be bad luck as well.  Patient says have to open the door and have to walk through it trembling as you walk through it.  Patient shares watching a portraiture show on [employer] motivating her.  Is ideas but has to have enough amount of energy.  Realizes she wants to do it have to do it and pursue it need a space for it. Use what go no money to buy more materials.  After coming up  with idea needs to build platform to sell.  Found to needs to find again an artist who was an inspiration very cat oriented there is style unique to her and vision carrying out with gusto.  Describes her technique she has a website as well can specify what you want. Can get back skills had it can get it back optimistic here and there during the week didn't have for years. Garden what would like and  what can do. Overreaching have to stop doing that because something doable overreach then feel as if she fails. Writing ideas down list of things to do. Things have to do clean the landing upstairs. When see things done encouraging. Do office next.  Patient talked about wanting to still working garden is much as she can describes a [place] word for this "[facility] walk".  Discussed this in terms of positive results from being out in nature there is a term for it from [facility] which means putting her hands in the ground looking up at the sky all very good for [place].  Patient shared as well good news knee starting to be addressed therapist noted this is important piece to work on to physically be feeling better as well as mentally.  Help her with positive steps she wants to take with different projects.  Reviewed session patient said therapy helpful to organize thoughts and to be positive therapist can see as she described those bursts of energy is positive signs. In session patient is very interesting lots of knowledge reviewed lots of interesting topics a lot about history about how that has a lot to tedious therapist noted that to help patient with coping she thinks about that when talked other patient patient has some coping skills other does not have that she well read knows the patterns of history deepens her understanding of what is going on today that can help.  Therapist interventions for today were reinforcing positive problem solving patient compartmentalizing organization taking 1 thing at a time as helpful also she has a to do list.  Therapist encouraging patient with projects with her art as she is very talented at talent she should pursue and is motivated to do that.  Positive in session as well to talk about enjoyable subjects, shows for example about travel unique shows that have a lot to enjoy and offer.  In our discussion we talked about past history as it comes up patient has done  research on cults think that is helpful for her to process as well in session bring her own understanding to it help in terms of working through relationship with her father part of overall goal of working through relationships of the past. Suicidal/Homicidal: No  Plan: Return again in 1 week.2.  Patient continue with breaking projects down to accomplish things have a to do list daily work on a lot of projects as a way to help her financially. 3.  Processed thoughts and feelings in session to help with coping and stressors  Diagnosis: Mood disorder in conditions classified elsewhere, Generalized  anxiety disorder, severe alcohol use  disorder  Collaboration of Care: Other none needed  Patient/Guardian was advised Release of Information must be obtained prior to any record release in order to collaborate their care with an outside provider. Patient/Guardian was advised if they have not already done so to contact the registration department to sign all necessary forms in order for [place] to release information regarding their care.   Consent: Patient/Guardian gives  verbal consent for treatment and assignment of benefits for services provided during this visit. Patient/Guardian expressed understanding and agreed to proceed.   Coolidge Breeze, LCSW 09/13/2023

## 2023-09-15 ENCOUNTER — Ambulatory Visit: Payer: Medicare Other | Admitting: Physical Therapy

## 2023-09-19 ENCOUNTER — Ambulatory Visit (HOSPITAL_COMMUNITY): Payer: Medicare Other | Admitting: Licensed Clinical Social Worker

## 2023-09-19 NOTE — Therapy (Signed)
 OUTPATIENT PHYSICAL THERAPY LOWER EXTREMITY EVALUATION   Patient Name: Lindsay [name] MRN: [id number] DOB:Nov 10, 1955, 68 y.o., female Today's Date: 09/19/2023  END OF SESSION:    Past Medical History:  Diagnosis Date   Abnormal mammogram    Thick Tissue   Acquired trigger finger of right ring finger 04/20/2022   Arthritis    Bilateral wrist pain 04/20/2022   Bipolar 1 disorder    Cataract    OU   Cervical radiculopathy 10/14/2021   Chalazion of left lower eyelid 04/15/2022   Chest pain 02/11/2015   Chronic atrial fibrillation 09/08/2015   Colon polyp    Compressed cervical disc    Coronary artery disease involving native coronary artery with angina pectoris 03/24/2015   Dorsalgia 09/22/2018   Dysphagia 09/22/2018   Essential hypertension 03/03/2015   Generalized anxiety disorder    GERD without esophagitis 09/22/2018   Hematemesis 12/18/2019   Hepatic steatosis 04/22/2022   History of appendectomy 1963   Hypertensive retinopathy    OU   Irritable bowel syndrome without diarrhea 09/22/2018   Lactic acidosis 12/18/2019   LLQ pain 04/15/2022   Major depressive disorder    Migraine without status migrainosus, not intractable 09/22/2018   Mixed hyperlipidemia 05/07/2016   Non-ST elevation MI (NSTEMI) 07/26/2008   No cardiac catheterization at the time.   Obstructive sleep apnea    no CPAP use   Orthostasis    Osteoarthritis of carpometacarpal (CMC) joint of thumb 04/20/2022   Osteoarthritis of knee 09/22/2018   PAF (paroxysmal atrial fibrillation) 10/09/2018   Pain in joint of right hip 07/03/2020   Pain in right knee 12/14/2018   Recurrent syncope    Rib pain 07/03/2020   Sciatica of right side 09/22/2018   Severe alcohol use disorder    Vitamin D deficiency    Past Surgical History:  Procedure Laterality Date   APPENDECTOMY     CARDIAC CATHETERIZATION N/A 03/25/2015   Procedure: Left Heart Cath and Coronary Angiography;  Surgeon: Marykay Lex, MD;  Location:  [facility] INVASIVE CV LAB;  Service: Cardiovascular;  Laterality: N/A;   CORONARY STENT INTERVENTION N/A 10/09/2018   Procedure: CORONARY STENT INTERVENTION;  Surgeon: Corky Crafts, MD;  Location[facility] INVASIVE CV LAB;  Service: Cardiovascular;  Laterality: N/A;   DILATATION & CURETTAGE/HYSTEROSCOPY WITH MYOSURE N/A 05/19/2016   Procedure: DILATATION & CURETTAGE/HYSTEROSCOPY WITH MYOSURE;  Surgeon: Geryl Rankins, MD;  Location: [facility];  Service: Gynecology;  Laterality: N/A;  Possible Myosure for polyps.   EYE SURGERY     LEFT HEART CATH AND CORONARY ANGIOGRAPHY N/A 10/09/2018   Procedure: LEFT HEART CATH AND CORONARY ANGIOGRAPHY;  Surgeon: Corky Crafts, MD;  Location: [facility] INVASIVE CV LAB;  Service: Cardiovascular;  Laterality: N/A;   MOUTH SURGERY     URETHRAL DILATION     Patient Active Problem List   Diagnosis Date Noted   Stress 02/02/2023   Insomnia 12/23/2022   Blood clotting disorder (HCC) 10/28/2022   Neck pain 10/27/2022   Acute thoracic back pain 10/27/2022   Lumbar radiculopathy 05/26/2022   Degeneration of lumbar intervertebral disc 05/26/2022   Major depressive disorder    Severe alcohol use disorder    Generalized anxiety disorder    Obstructive sleep apnea    Hepatic steatosis 04/22/2022   Osteoarthritis of carpometacarpal (CMC) joint of thumb 04/20/2022   Acquired trigger finger of right ring finger 04/20/2022   Bilateral wrist pain 04/20/2022   Chalazion of left lower eyelid 04/15/2022   Bipolar 1 disorder (  HCC)    Cervical radiculopathy 10/14/2021   Pain in joint of right hip 07/03/2020   Rib pain 07/03/2020   Recurrent syncope    Orthostasis    Lactic acidosis 12/18/2019   Hematemesis 12/18/2019   Pain in right knee 12/14/2018   PAF (paroxysmal atrial fibrillation) 10/09/2018   Dorsalgia 09/22/2018   Dysphagia 09/22/2018   GERD without esophagitis 09/22/2018   Irritable bowel syndrome without diarrhea 09/22/2018   Osteoarthritis of knee 09/22/2018    Polypharmacy 09/22/2018   Sciatica of right side 09/22/2018   Vitamin D deficiency 09/22/2018   Mixed hyperlipidemia 05/07/2016   Chronic atrial fibrillation (HCC) 09/08/2015   Coronary artery disease involving native coronary artery with angina pectoris 03/24/2015   Essential hypertension 03/03/2015   Chest pain 02/11/2015   Non-ST elevation MI (NSTEMI) (HCC) 07/26/2008    PCP: Chilton Si, MD  REFERRING PROVIDER: Elvin So, PA-C  REFERRING DIAG: [phone] (ICD-10-CM) - Pain in right knee  THERAPY DIAG:  No diagnosis found.  Rationale for Evaluation and Treatment: Rehabilitation  ONSET DATE: 3 years ago  SUBJECTIVE:   SUBJECTIVE STATEMENT: ***  Pt reports ~3 year history of BIL knee pain, R>L. Does not endorse any specific injury, reports this was preceded by job with more time on feet. States symptoms slowly seem to be worsening, knee will occasionally buckle. Tends to fluctuate, oftentimes based on work activities. Has fluctuating swelling as well through lateral R knee. She also endorses history of neck/UE pain, cramping in legs and arms for which she states she is in communication w/ providers. Also mentions a R shoulder subluxation a couple months ago, states she will be getting an MRI soon. Also reports seeing counselor weekly for mental health hx.   PERTINENT HISTORY: BPD1, afib, CAD w/ chest pain, dysphagia, HTN, GAD, depression, migraine, NSTEMI, OSA, recurrent syncope  PAIN:  Are you having pain: 2-3/10 Location/description: R>L knee, posterior and anterior Best-worst over past week: 2-7/10  - aggravating factors: walking, standing ,stairs - Easing factors: pain ointment, pain medication  PRECAUTIONS: cardiac hx     WEIGHT BEARING RESTRICTIONS: No  FALLS:  Has patient fallen in last 6 months? No  LIVING ENVIRONMENT: Uses cane PRN for balance Lives w/ cats; lives in split level, 2 sets of stairs - BIL rails, 6-8 vs 8-10 steps on  each  OCCUPATION: cashier  PLOF: Independent - enjoys gardening, art  PATIENT GOALS: build strength/muscle, get back to art  NEXT MD VISIT: TBD  OBJECTIVE:  Note: Objective measures were completed at Evaluation unless otherwise noted.  DIAGNOSTIC FINDINGS:  No recent imaging in chart - pt states she has had XR, endorses some degenerative changes   PATIENT SURVEYS:  Deferred given time constraints  COGNITION: Overall cognitive status: Within functional limits for tasks assessed     SENSATION: NT  EDEMA:  Pt endorses fluctuating edema in lateral R knee    LOWER EXTREMITY ROM:      Right eval Left eval  Hip flexion    Hip extension    Hip internal rotation    Hip external rotation    Knee extension A: full   A: full  Knee flexion A: 119 deg   A: 124 deg   (Blank rows = not tested) (Key: WFL = within functional limits not formally assessed, * = concordant pain, s = stiffness/stretching sensation, NT = not tested)  Comments:    LOWER EXTREMITY MMT:    MMT Right eval Left eval  Hip flexion  4 4  Hip abduction (modified sitting) 5 5  Hip internal rotation    Hip external rotation    Knee flexion 4+ 5  Knee extension 4+ 5  Ankle dorsiflexion 4+ 5   (Blank rows = not tested) (Key: WFL = within functional limits not formally assessed, * = concordant pain, s = stiffness/stretching sensation, NT = not tested)  Comments:    LOWER EXTREMITY SPECIAL TESTS:  Deferred given time constraints  FUNCTIONAL TESTS:  5xSTS: 11.69sec w/ mild pain  TUG: 10.27sec w/o AD, antalgic gait  GAIT: Distance walked: within clinic Assistive device utilized: None Level of assistance: Complete Independence Comments: antalgic gait R>L, reduced gait speed/cadence                                                                                                                                TREATMENT DATE:  LEFS ***  [facility] Adult PT Treatment:                                                 DATE: 09/20/23 Therapeutic Exercise: Seated clamshell Manual Therapy: *** Neuromuscular re-ed: Supine quad set Supine SAQ Supine SLR and with ER Standing hip ext with resistance Standing hip diagonal with resistance Therapeutic Activity: LEFS completed Sit to stand Functional squat Gait: *** Modalities: *** Self Care: ***      [facility] Adult PT Treatment:                                                DATE: 09/13/23 Deferred given time constraints   PATIENT EDUCATION:  Education details: Pt education on PT impairments, prognosis, and POC. Informed consent. Rationale for interventions Person educated: Patient Education method: Explanation, Demonstration, Tactile cues, Verbal cues Education comprehension: verbalized understanding, returned demonstration, verbal cues required, tactile cues required, and needs further education    HOME EXERCISE PROGRAM: Access Code: [email or web address] (previous) URL: [email or web address] Date: 03/22/2023 Prepared by: Raynelle Fanning   Exercises - Figure 4 Bridge  - 1 x daily - 7 x weekly - 2 sets - 10 reps - Straight Leg Raise with External Rotation  - 1 x daily - 7 x weekly - 2 sets - 10 reps - Hip Extension with Resistance Loop  - 1 x daily - 7 x weekly - 3 sets - 10 reps - Diagonal Hip Extension with Resistance  - 1 x daily - 7 x weekly - 3 sets - 10 reps - Supine Hamstring Stretch with Strap  - 1 x daily - 7 x weekly - 2 sets - 30 sec hold - Supine Quadriceps Stretch with Strap on Table  - 1 x daily - 7 x weekly -  2 sets - 30 sec hold - Supine Piriformis Stretch with Foot on Ground  - 1 x daily - 7 x weekly - 2 sets - 30 sec hold - Clamshell with Resistance  - 1 x daily - 7 x weekly - 2 sets - 10 reps - Seated Piriformis Stretch with Trunk Bend  - 1 x daily - 7 x weekly - 2 sets - 30 sec hold - Seated Hamstring Stretch  - 1 x daily - 7 x weekly - 2 sets - 30 sec hold - Dead Bug  - 1 x daily - 7 x weekly - 2 sets - 10 reps - Side  Stepping with Resistance at Ankles and Counter Support  - 1 x daily - 7 x weekly - 2 sets - 10 reps - Hip Extension with Single Leg Support Prone on Table Edge  - 1 x daily - 7 x weekly - 3 sets - 10 reps - Half Deadlift with Kettlebell  - 1 x daily - 7 x weekly - 3 sets - 10 reps - Sit to Stand  - 1 x daily - 7 x weekly - 3 sets - 10 reps - Supine Quad Set  - 1 x daily - 7 x weekly - 2 sets - 10 reps - 5 sec hold - Supine Isometric Hamstring Set  - 1 x daily - 7 x weekly - 2 sets - 10 reps - 5 hold - Supine Knee Extension Strengthening  - 1 x daily - 7 x weekly - 2 sets - 10 reps - 5 hold - Supine Active Straight Leg Raise  - 1 x daily - 3 x weekly - 2 sets - 10 reps  ASSESSMENT:  CLINICAL IMPRESSION: ***     OBJECTIVE IMPAIRMENTS: Abnormal gait, decreased activity tolerance, decreased balance, decreased endurance, decreased mobility, difficulty walking, decreased ROM, decreased strength, and pain.   ACTIVITY LIMITATIONS: carrying, lifting, bending, standing, squatting, sleeping, stairs, transfers, and locomotion level  PARTICIPATION LIMITATIONS: meal prep, cleaning, laundry, community activity, and occupation  PERSONAL FACTORS: Age, Time since onset of injury/illness/exacerbation, and 3+ comorbidities: BPD1, afib, CAD w/ chest pain, dysphagia, HTN, GAD, depression, migraine, NSTEMI, OSA, recurrent syncope  are also affecting patient's functional outcome.   REHAB POTENTIAL: Good  CLINICAL DECISION MAKING: Evolving/moderate complexity  EVALUATION COMPLEXITY: Moderate   GOALS:  SHORT TERM GOALS: Target date: 10/04/2023 Pt will demonstrate appropriate understanding and performance of initially prescribed HEP in order to facilitate improved independence with management of symptoms.  Baseline: HEP TBD  Goal status: INITIAL   2. Pt will report at least 25% improvement in overall pain levels over past week in order to facilitate improved tolerance to typical daily activities.    Baseline: 2-7/10  Goal status: INITIAL   LONG TERM GOALS: Target date: 10/25/2023 Pt will improve MCID or greater on LEFS in order to demonstrate improved perception of function due to symptoms (MCID 9 pts) Baseline: LEFS TBD Goal status: INITIAL  2.  Pt will demonstrate 5/5 MMT BIL knees/ankles for improved functional strength.  Baseline: See MMT chart above Goal status: INITIAL  3.  Pt will report no more than 4/10 knee pain on NPS with average work day in order to facilitate improved tolerance to work tasks.  Baseline: increased pain at end of work day, up to 7/10 Goal status: INITIAL  4.  Pt will be able to perform 5xSTS in less than or equal to 9sec in order to demonstrate reduced fall risk and improved functional  independence (MCID 5xSTS = 2.3 sec). Baseline: 11sec Goal status: INITIAL   5. Pt will report at least 50% decrease in overall pain levels in past week in order to facilitate improved tolerance to basic ADLs/mobility.   Baseline: 2-7/10  Goal status: INITIAL    6. Pt will demonstrate appropriate performance of final prescribed HEP in order to facilitate improved self-management of symptoms post-discharge.   Baseline: HEP TBD  Goal status: INITIAL     PLAN:  PT FREQUENCY: 1-2x/week  PT DURATION: 6 weeks  PLANNED INTERVENTIONS: 97164- PT Re-evaluation, 97110-Therapeutic exercises, 97530- Therapeutic activity, 97112- Neuromuscular re-education, 97535- Self Care, 16109- Manual therapy, [phone]- Gait training, Patient/Family education, Balance training, Stair training, Taping, Dry Needling, Joint mobilization, Cryotherapy, and Moist heat  PLAN FOR NEXT SESSION: establish HEP. Administer LEFS, could consider DGI/FGA. Focus on quad/hamstring motor control in open and closed chain, functional mechanics. Symptom modification strategies as indicated/appropriate, mindful of cardiac history.    Solon Palm, PT  09/19/2023 6:31 PM

## 2023-09-20 ENCOUNTER — Ambulatory Visit (INDEPENDENT_AMBULATORY_CARE_PROVIDER_SITE_OTHER): Payer: Medicare Other | Admitting: Licensed Clinical Social Worker

## 2023-09-20 ENCOUNTER — Ambulatory Visit: Payer: Medicare Other | Admitting: Physical Therapy

## 2023-09-20 ENCOUNTER — Encounter: Payer: Self-pay | Admitting: Physical Therapy

## 2023-09-20 DIAGNOSIS — M6281 Muscle weakness (generalized): Secondary | ICD-10-CM

## 2023-09-20 DIAGNOSIS — F0630 Mood disorder due to known physiological condition, unspecified: Secondary | ICD-10-CM

## 2023-09-20 DIAGNOSIS — M25562 Pain in left knee: Secondary | ICD-10-CM | POA: Diagnosis not present

## 2023-09-20 DIAGNOSIS — G8929 Other chronic pain: Secondary | ICD-10-CM | POA: Diagnosis not present

## 2023-09-20 DIAGNOSIS — R262 Difficulty in walking, not elsewhere classified: Secondary | ICD-10-CM

## 2023-09-20 DIAGNOSIS — F1020 Alcohol dependence, uncomplicated: Secondary | ICD-10-CM

## 2023-09-20 DIAGNOSIS — M25561 Pain in right knee: Secondary | ICD-10-CM | POA: Diagnosis not present

## 2023-09-20 DIAGNOSIS — F411 Generalized anxiety disorder: Secondary | ICD-10-CM

## 2023-09-20 NOTE — Progress Notes (Signed)
 THERAPIST PROGRESS NOTE  Session Time: 1:00 PM to 1:48 PM  Participation Level: Active  Behavioral Response: CasualAlertappropriate  Type of Therapy: Individual Therapy  Treatment Goals addressed: work on Pharmacologist for anxiety depression to decrease symptoms, work on mood management, mood stabilization, coping in general, patient is collaterally managing substance use issues through AA, coping, address significant stressor of state of her house and home repair is really necessary but   ProgressTowards Goals: Progressing-progress and working through issues the past letting go of resentments tapping into memory is not going negative noted working from a place of kindness gratitude honesty noted these principles are helpful for coping  Interventions: Solution Focused, Strength-based, Supportive, and Other: coping  Summary: Lindsay [name] is a 68 y.o. female who presents with feels good just came from the [place] and likes her hair cut therapist thinks it looks good. Has her ups and downs there was a young man who she feel in love with and loved ever since only female person would have married a Metallurgist, Gaffer, kind and gentle finally he met love of life in 33. Talked to him until 2:30 in the morning last night met at [facility] her college school for cult.  They were creative and rebels.  Were rebellious.  Shared some of what we are working on in therapy that she manages parents differently let go of resentments tapped in to her memories not go into negative like an elixir of freedom, love and gratitude. Let it go. Happiness and gratitude, kindness, honestly.  Watching it now as far as helping to focus on positives noticed that with her friend who is doing that not happy teaching.  Noted in conversation focusing on the negative focusing on the students missing patient says let them fill let them learn a lesson.  Struggles with at times lack of hope and explains reason because of money also in making  choices physical capacities limits choices. Has a good home good rooms taking up with crap with bad rooms. Electricity issues, AC humidification ruining walls  Has to have project or don't feel good whole life.  Thinking of her goals of small garden, watching portrait artists watching techniques inspiring how incredible talented and making a living. Start with pets. Looking at what people doing and she can do better if do it and money for equipment need space before can do it. That is what she is working on Psychologist, educational work and business. To do that need to research set up website spreading the word get a presence. Get to shows. Can sell things. Therapist noted to use the talent. Patient thinks make a business plan.  Life is here and right now taken from you instantly.  Talked about dreams resolving loss. The beauty of them. Brain resolving things had a dream asking God to help Georgie seeing this a way of resolving her issues struggling with.    Patient talked about key components of how she lives relies include gratitude kindness honesty.  Therapist agrees with her following these guidelines added theory that positive emotions can facilitate the healing process help with adjustment and hope such positive emotions of happiness forgiveness gratitude a sense of all can nurture coping with different things.  Positive emotions can bolster resilience such resilience building behaviors can increase affect management capacities for increasing hippocampal volume decrease amygdala activity in size increase serotonin endorphin production and activate the prefrontal cortex patient recognizing the session a friend will work to with her friend to focus more  on the positive that will be helpful.  Therapist noted as a practice to be aware of and to take steps to do this.  Noted significant clinical progress for patient her sense of managing memories of parents differently tapping of memories does not go negative like an elixir of  freedom love and gratitude.  Noted projects is helpful for patient building hope therapist sees already positive aspect for her patient realizes she has to get better organized as she works on projects we will look at [facility] next session. Suicidal/Homicidal: No  Plan: Return again in 1 week.2.  Look at hope for patient as she struggles with that at times look at [facility] look in terms of projects and is helpful for building hope  Diagnosis: Mood disorder in conditions classified elsewhere, Generalized  anxiety disorder, severe alcohol use  disorder  Collaboration of Care: Other none needed  Patient/Guardian was advised Release of Information must be obtained prior to any record release in order to collaborate their care with an outside provider. Patient/Guardian was advised if they have not already done so to contact the registration department to sign all necessary forms in order for [place] to release information regarding their care.   Consent: Patient/Guardian gives verbal consent for treatment and assignment of benefits for services provided during this visit. Patient/Guardian expressed understanding and agreed to proceed.   Coolidge Breeze, [place] 09/20/2023

## 2023-09-23 ENCOUNTER — Ambulatory Visit: Payer: Medicare Other

## 2023-09-27 ENCOUNTER — Ambulatory Visit (INDEPENDENT_AMBULATORY_CARE_PROVIDER_SITE_OTHER): Payer: Medicare Other | Admitting: Licensed Clinical Social Worker

## 2023-09-27 ENCOUNTER — Ambulatory Visit: Payer: Medicare Other | Admitting: Physical Therapy

## 2023-09-27 ENCOUNTER — Ambulatory Visit (HOSPITAL_COMMUNITY): Payer: Medicare Other | Admitting: Psychiatry

## 2023-09-27 DIAGNOSIS — F1020 Alcohol dependence, uncomplicated: Secondary | ICD-10-CM

## 2023-09-27 DIAGNOSIS — F0630 Mood disorder due to known physiological condition, unspecified: Secondary | ICD-10-CM | POA: Diagnosis not present

## 2023-09-27 DIAGNOSIS — F411 Generalized anxiety disorder: Secondary | ICD-10-CM | POA: Diagnosis not present

## 2023-09-27 NOTE — Progress Notes (Signed)
 THERAPIST PROGRESS NOTE  Session Time: 1:00 PM to 1:45 PM  Participation Level: Active  Behavioral Response: CasualAlertappropriate dysphoric talking about certain painful memories  Type of Therapy: Individual Therapy  Treatment Goals addressed: work on Pharmacologist for anxiety depression to decrease symptoms, work on mood management, mood stabilization, coping in general, patient is collaterally managing substance use issues through AA, coping, address significant stressor of state of her house and home repair is really necessary   ProgressTowards Goals: Progressing-patient opening up about painful periods her life assess therapeutic to talk about in a therapeutic relationship help her work through emotion help provide perspectives that are helpful and working through these painful emotions  Interventions: Solution Focused, Strength-based, Supportive, and Other: Coping  Summary: Lindsay [name] is a 68 y.o. female who presents with her belief that she can reconstruct who am and who want to be.  Therapist ultimately believes that this is well because doing therapy helping people with that.  Patient wants to reconstruct a way that she wants to live by being present.  Says if she had to do life again should be historical preservationist. Lindsay [name] of all trades is something that fits her. Shared about her experiences using.  She had decided she was going to die by drinking herself to death. Generally not over imbide did not like people when drinking when she was going to do it had control over it as well.  Patient describes that like she was split brain appalled and relieved.  Therapist noted drinking play a part in her decision making and patient can see that. e. Not who you are. Put herself in hospital for a week. Went home cried for two hours wrote a note Lindsay [name] found her before did anything didn't focus on anything she had and wanted to accomplish doesn't know why normally strong about future feeling a  little defeated.  Describes what she was experiencing at that time take a step ahead something set herself back financially constant zig zag taking a toll.  Patient describes throwing it all the way although was off and on drinking explored this further patient says maybe father wanted her to fail.  As something contributing to all this. 51/2 years sober sponsor was a good one.  Patient describes Gestalt as something helpful hearing yourself and remembering helps. Projecting it out useful to her. She was 17 when using this.  Patient reflects drinking she was the opposite of what is important but still has thoughts of having a drink. Trigger when get home tired coming home from work. Ice tea and rest.  She really likes ice tea so good substitute.  Patient talked about experiences drinking which are painful for her she realizes as well as therapist therapeutic to talk about with therapist.  Helpful for therapeutic relationship unconditional regard helpful working through painful experiences therapist using cognitive strategies to challenge patient's perspectives.  She is feeling bad about who she became and therapist explained that the nature of addiction you become somebody different changes who you are one of the motivating factors to stop using.  Therapist feels patient sharing of these painful emotions shows trust she has in the relationship ultimately helps therapeutic value.  Ultimately discussion is motivating type of intervention at the same time important for patient to be not judged excepted unconditional regard.  Therapist provided space and support for patient to talk about thoughts and feelings in session. Suicidal/Homicidal: No  Plan: Return again in 1 week.2.  Reviewed strategies for hope  process thoughts and feelings in session help cope with stressors continue to review history that helps with her mental health symptoms as well  Diagnosis: Mood disorder in conditions classified elsewhere,  Generalized  anxiety disorder, severe alcohol use  disorder   Collaboration of Care: Other none needed  Patient/Guardian was advised Release of Information must be obtained prior to any record release in order to collaborate their care with an outside provider. Patient/Guardian was advised if they have not already done so to contact the registration department to sign all necessary forms in order for [place] to release information regarding their care.   Consent: Patient/Guardian gives verbal consent for treatment and assignment of benefits for services provided during this visit. Patient/Guardian expressed understanding and agreed to proceed.   Coolidge Breeze, LCSW 09/27/2023

## 2023-09-29 ENCOUNTER — Ambulatory Visit: Payer: Medicare Other | Attending: Physician Assistant | Admitting: Physical Therapy

## 2023-09-29 ENCOUNTER — Ambulatory Visit (HOSPITAL_COMMUNITY): Payer: Medicare Other | Admitting: Psychiatry

## 2023-09-29 ENCOUNTER — Encounter (HOSPITAL_COMMUNITY): Payer: Self-pay | Admitting: Psychiatry

## 2023-09-29 ENCOUNTER — Encounter: Payer: Self-pay | Admitting: Physical Therapy

## 2023-09-29 VITALS — BP 130/79 | HR 70 | Ht 62.0 in | Wt 173.0 lb

## 2023-09-29 DIAGNOSIS — F0630 Mood disorder due to known physiological condition, unspecified: Secondary | ICD-10-CM | POA: Diagnosis not present

## 2023-09-29 DIAGNOSIS — G8929 Other chronic pain: Secondary | ICD-10-CM | POA: Diagnosis not present

## 2023-09-29 DIAGNOSIS — M25561 Pain in right knee: Secondary | ICD-10-CM | POA: Diagnosis not present

## 2023-09-29 DIAGNOSIS — R262 Difficulty in walking, not elsewhere classified: Secondary | ICD-10-CM | POA: Diagnosis not present

## 2023-09-29 DIAGNOSIS — F411 Generalized anxiety disorder: Secondary | ICD-10-CM | POA: Diagnosis not present

## 2023-09-29 DIAGNOSIS — M25562 Pain in left knee: Secondary | ICD-10-CM | POA: Diagnosis not present

## 2023-09-29 DIAGNOSIS — F1020 Alcohol dependence, uncomplicated: Secondary | ICD-10-CM | POA: Diagnosis not present

## 2023-09-29 DIAGNOSIS — M6281 Muscle weakness (generalized): Secondary | ICD-10-CM | POA: Insufficient documentation

## 2023-09-29 MED ORDER — GABAPENTIN 100 MG PO CAPS: 100.0000 mg | ORAL_CAPSULE | Freq: Two times a day (BID) | ORAL | 1 refills | Status: DC

## 2023-09-29 MED ORDER — BUPROPION HCL ER (SR) 100 MG PO TB12: 100.0000 mg | ORAL_TABLET | Freq: Two times a day (BID) | ORAL | 1 refills | Status: DC

## 2023-09-29 NOTE — Therapy (Signed)
 OUTPATIENT PHYSICAL THERAPY LOWER EXTREMITY TREATMENT   Patient Name: Lindsay [name] MRN: [id number] DOB:11-25-1955, 68 y.o., female Today's Date: 09/29/2023  END OF SESSION:  PT End of Session - 09/29/23 1147     Visit Number 3    Number of Visits 13    Date for PT Re-Evaluation 10/25/23    Authorization Type Medicare    Progress Note Due on Visit 10    PT Start Time 1145    PT Stop Time 1225    PT Time Calculation (min) 40 min    Activity Tolerance Patient tolerated treatment well    Behavior During Therapy [facility] for tasks assessed/performed               Past Medical History:  Diagnosis Date   Abnormal mammogram    Thick Tissue   Acquired trigger finger of right ring finger 04/20/2022   Arthritis    Bilateral wrist pain 04/20/2022   Bipolar 1 disorder    Cataract    OU   Cervical radiculopathy 10/14/2021   Chalazion of left lower eyelid 04/15/2022   Chest pain 02/11/2015   Chronic atrial fibrillation 09/08/2015   Colon polyp    Compressed cervical disc    Coronary artery disease involving native coronary artery with angina pectoris 03/24/2015   Dorsalgia 09/22/2018   Dysphagia 09/22/2018   Essential hypertension 03/03/2015   Generalized anxiety disorder    GERD without esophagitis 09/22/2018   Hematemesis 12/18/2019   Hepatic steatosis 04/22/2022   History of appendectomy 1963   Hypertensive retinopathy    OU   Irritable bowel syndrome without diarrhea 09/22/2018   Lactic acidosis 12/18/2019   LLQ pain 04/15/2022   Major depressive disorder    Migraine without status migrainosus, not intractable 09/22/2018   Mixed hyperlipidemia 05/07/2016   Non-ST elevation MI (NSTEMI) 07/26/2008   No cardiac catheterization at the time.   Obstructive sleep apnea    no CPAP use   Orthostasis    Osteoarthritis of carpometacarpal (CMC) joint of thumb 04/20/2022   Osteoarthritis of knee 09/22/2018   PAF (paroxysmal atrial fibrillation) 10/09/2018   Pain in joint of  right hip 07/03/2020   Pain in right knee 12/14/2018   Recurrent syncope    Rib pain 07/03/2020   Sciatica of right side 09/22/2018   Severe alcohol use disorder    Vitamin D deficiency    Past Surgical History:  Procedure Laterality Date   APPENDECTOMY     CARDIAC CATHETERIZATION N/A 03/25/2015   Procedure: Left Heart Cath and Coronary Angiography;  Surgeon: Marykay Lex, MD;  Location: [facility] INVASIVE CV LAB;  Service: Cardiovascular;  Laterality: N/A;   CORONARY STENT INTERVENTION N/A 10/09/2018   Procedure: CORONARY STENT INTERVENTION;  Surgeon: Corky Crafts, MD;  Location: [facility] INVASIVE CV LAB;  Service: Cardiovascular;  Laterality: N/A;   DILATATION & CURETTAGE/HYSTEROSCOPY WITH MYOSURE N/A 05/19/2016   Procedure: DILATATION & CURETTAGE/HYSTEROSCOPY WITH MYOSURE;  Surgeon: Geryl Rankins, MD;  Location: [facility];  Service: Gynecology;  Laterality: N/A;  Possible Myosure for polyps.   EYE SURGERY     LEFT HEART CATH AND CORONARY ANGIOGRAPHY N/A 10/09/2018   Procedure: LEFT HEART CATH AND CORONARY ANGIOGRAPHY;  Surgeon: Corky Crafts, MD;  Location: [facility] INVASIVE CV LAB;  Service: Cardiovascular;  Laterality: N/A;   MOUTH SURGERY     URETHRAL DILATION     Patient Active Problem List   Diagnosis Date Noted   Stress 02/02/2023   Insomnia 12/23/2022  Blood clotting disorder (HCC) 10/28/2022   Neck pain 10/27/2022   Acute thoracic back pain 10/27/2022   Lumbar radiculopathy 05/26/2022   Degeneration of lumbar intervertebral disc 05/26/2022   Major depressive disorder    Severe alcohol use disorder    Generalized anxiety disorder    Obstructive sleep apnea    Hepatic steatosis 04/22/2022   Osteoarthritis of carpometacarpal (CMC) joint of thumb 04/20/2022   Acquired trigger finger of right ring finger 04/20/2022   Bilateral wrist pain 04/20/2022   Chalazion of left lower eyelid 04/15/2022   Bipolar 1 disorder (HCC)    Cervical radiculopathy 10/14/2021   Pain in joint of  right hip 07/03/2020   Rib pain 07/03/2020   Recurrent syncope    Orthostasis    Lactic acidosis 12/18/2019   Hematemesis 12/18/2019   Pain in right knee 12/14/2018   PAF (paroxysmal atrial fibrillation) 10/09/2018   Dorsalgia 09/22/2018   Dysphagia 09/22/2018   GERD without esophagitis 09/22/2018   Irritable bowel syndrome without diarrhea 09/22/2018   Osteoarthritis of knee 09/22/2018   Polypharmacy 09/22/2018   Sciatica of right side 09/22/2018   Vitamin D deficiency 09/22/2018   Mixed hyperlipidemia 05/07/2016   Chronic atrial fibrillation (HCC) 09/08/2015   Coronary artery disease involving native coronary artery with angina pectoris 03/24/2015   Essential hypertension 03/03/2015   Chest pain 02/11/2015   Non-ST elevation MI (NSTEMI) (HCC) 07/26/2008    PCP: Chilton Si, MD  REFERRING PROVIDER: Elvin So, PA-C  REFERRING DIAG: [phone] (ICD-10-CM) - Pain in right knee  THERAPY DIAG:  Chronic pain of both knees  Difficulty in walking, not elsewhere classified  Muscle weakness (generalized)  Rationale for Evaluation and Treatment: Rehabilitation  ONSET DATE: 3 years ago  SUBJECTIVE:   SUBJECTIVE STATEMENT: My knees felt so much better the day of my last treatment. I have been sick with the Norovirus. Left knee feels tighter than R.  Pt reports ~3 year history of BIL knee pain, R>L. Does not endorse any specific injury, reports this was preceded by job with more time on feet. States symptoms slowly seem to be worsening, knee will occasionally buckle. Tends to fluctuate, oftentimes based on work activities. Has fluctuating swelling as well through lateral R knee. She also endorses history of neck/UE pain, cramping in legs and arms for which she states she is in communication w/ providers. Also mentions a R shoulder subluxation a couple months ago, states she will be getting an MRI soon. Also reports seeing counselor weekly for mental health hx.    PERTINENT HISTORY: BPD1, afib, CAD w/ chest pain, dysphagia, HTN, GAD, depression, migraine, NSTEMI, OSA, recurrent syncope  PAIN:  Are you having pain: 2-3/10 Location/description: R>L knee, posterior and anterior Best-worst over past week: 2-7/10  - aggravating factors: walking, standing ,stairs - Easing factors: pain ointment, pain medication  PRECAUTIONS: cardiac hx     WEIGHT BEARING RESTRICTIONS: No  FALLS:  Has patient fallen in last 6 months? No  LIVING ENVIRONMENT: Uses cane PRN for balance Lives w/ cats; lives in split level, 2 sets of stairs - BIL rails, 6-8 vs 8-10 steps on each  OCCUPATION: cashier  PLOF: Independent - enjoys gardening, art  PATIENT GOALS: build strength/muscle, get back to art  NEXT MD VISIT: TBD  OBJECTIVE:  Note: Objective measures were completed at Evaluation unless otherwise noted.  DIAGNOSTIC FINDINGS:  No recent imaging in chart - pt states she has had XR, endorses some degenerative changes   PATIENT SURVEYS:  09/20/23 LEFS 27 / 80 = 33.8 %  COGNITION: Overall cognitive status: Within functional limits for tasks assessed     SENSATION: NT  EDEMA:  Pt endorses fluctuating edema in lateral R knee    LOWER EXTREMITY ROM:      Right eval Left eval  Hip flexion    Hip extension    Hip internal rotation    Hip external rotation    Knee extension A: full   A: full  Knee flexion A: 119 deg   A: 124 deg   (Blank rows = not tested) (Key: WFL = within functional limits not formally assessed, * = concordant pain, s = stiffness/stretching sensation, NT = not tested)  Comments:    LOWER EXTREMITY MMT:    MMT Right eval Left eval  Hip flexion 4 4  Hip abduction (modified sitting) 5 5  Hip internal rotation    Hip external rotation    Knee flexion 4+ 5  Knee extension 4+ 5  Ankle dorsiflexion 4+ 5   (Blank rows = not tested) (Key: WFL = within functional limits not formally assessed, * = concordant pain, s =  stiffness/stretching sensation, NT = not tested)  Comments:    LOWER EXTREMITY SPECIAL TESTS:  Deferred given time constraints  FUNCTIONAL TESTS:  5xSTS: 11.69sec w/ mild pain  TUG: 10.27sec w/o AD, antalgic gait  GAIT: Distance walked: within clinic Assistive device utilized: None Level of assistance: Complete Independence Comments: antalgic gait R>L, reduced gait speed/cadence                                                                                                                                TREATMENT DATE:   [facility] Adult PT Treatment:                                                DATE: 09/29/23  Therapeutic Exercise: Nustep L5 x 5.5 min status discussed Seated clamshell x 20 Blue band Bridge 10 sec x 5, then with blue band +clam x 10 Seated LAQ B x 20 Prone knee flexion x 10 B Prone knee flexion stretch x 30 sec B  Neuromuscular re-ed: Supine quad set 5 sec hold x 10 - cues to avoid buttocks Prone quad set 5 sec hold x 10 (supine is better) Supine SAQ 5 sec hold x 20 Supine SLR and with ER - unable to do so moved to S/L SLR B x 15  Therapeutic Activity: Sit to stand x 10 with blue band, sit to stand 8# x 15     [facility] Adult PT Treatment:  DATE: 09/20/23 LEFS  27 / 80 = 33.8 % Therapeutic Exercise: Nustep L5 x 5 min status discussed Seated clamshell x 20 Blue band Bridge 10 sec x 5 Seated LAQ B x 20  Neuromuscular re-ed: Supine quad set 5 sec hold x 20 - cues to avoid buttocks Supine SAQ 5 sec hold x 20 Supine SLR and with ER - unable to do so moved to S/L S/L SLR R x 20 S/L hip ABD x 20 Standing hip ext with resistance Standing hip diagonal with resistance  Therapeutic Activity: LEFS completed while on Nustep Sit to stand x 10, sit to stand 10# x 10 - crepitis Functional squat x 10    [facility] Adult PT Treatment:                                                DATE: 09/13/23 Deferred given time  constraints   PATIENT EDUCATION:  Education details: HEP established Person educated: Patient Education method: Explanation, Demonstration, Tactile cues, Verbal cues Education comprehension: verbalized understanding, returned demonstration, verbal cues required, tactile cues required, and needs further education    HOME EXERCISE PROGRAM: Access Code: [email or web address] URL: [email or web address] Date: 09/20/2023 Prepared by: Raynelle Fanning  Exercises - Supine Quadricep Sets  - 1 x daily - 7 x weekly - 2 sets - 15 reps - 5 second hold - Supine Knee Extension Strengthening  - 1 x daily - 7 x weekly - 2 sets - 15 reps - Sidelying Hip Flexion  - 1 x daily - 3 x weekly - 2 sets - 15 reps - Sidelying Hip Abduction  - 1 x daily - 3 x weekly - 2 sets - 15 reps - Squat with Chair Touch  - 1 x daily - 3 x weekly - 1-2 sets - 10 reps - Seated Long Arc Quad  - 1 x daily - 7 x weekly - 22 sets - 15 reps - Supine Bridge  - 1 x daily - 7 x weekly - 1-3 sets - 10 reps - 10 sec hold  ASSESSMENT:  CLINICAL IMPRESSION: Decreased pain since last visit and improved tolerance to exercise today. Patient able to add resistance to many exercises and perform SLR in supine position. No crepitus reported with sit to stands today either.   OBJECTIVE IMPAIRMENTS: Abnormal gait, decreased activity tolerance, decreased balance, decreased endurance, decreased mobility, difficulty walking, decreased ROM, decreased strength, and pain.   ACTIVITY LIMITATIONS: carrying, lifting, bending, standing, squatting, sleeping, stairs, transfers, and locomotion level  PARTICIPATION LIMITATIONS: meal prep, cleaning, laundry, community activity, and occupation  PERSONAL FACTORS: Age, Time since onset of injury/illness/exacerbation, and 3+ comorbidities: BPD1, afib, CAD w/ chest pain, dysphagia, HTN, GAD, depression, migraine, NSTEMI, OSA, recurrent syncope  are also affecting patient's functional outcome.   REHAB POTENTIAL:  Good  CLINICAL DECISION MAKING: Evolving/moderate complexity  EVALUATION COMPLEXITY: Moderate   GOALS:  SHORT TERM GOALS: Target date: 10/04/2023 Pt will demonstrate appropriate understanding and performance of initially prescribed HEP in order to facilitate improved independence with management of symptoms.  Baseline: HEP TBD  Goal status: INITIAL   2. Pt will report at least 25% improvement in overall pain levels over past week in order to facilitate improved tolerance to typical daily activities.   Baseline: 2-7/10  Goal status: INITIAL   LONG TERM GOALS: Target date: 10/25/2023 Pt will improve MCID or greater  on LEFS in order to demonstrate improved perception of function due to symptoms (MCID 9 pts) Baseline: LEFS TBD Goal status: INITIAL  2.  Pt will demonstrate 5/5 MMT BIL knees/ankles for improved functional strength.  Baseline: See MMT chart above Goal status: INITIAL  3.  Pt will report no more than 4/10 knee pain on NPS with average work day in order to facilitate improved tolerance to work tasks.  Baseline: increased pain at end of work day, up to 7/10 Goal status: INITIAL  4.  Pt will be able to perform 5xSTS in less than or equal to 9sec in order to demonstrate reduced fall risk and improved functional independence (MCID 5xSTS = 2.3 sec). Baseline: 11sec Goal status: INITIAL   5. Pt will report at least 50% decrease in overall pain levels in past week in order to facilitate improved tolerance to basic ADLs/mobility.   Baseline: 2-7/10  Goal status: INITIAL    6. Pt will demonstrate appropriate performance of final prescribed HEP in order to facilitate improved self-management of symptoms post-discharge.   Baseline: HEP TBD  Goal status: INITIAL     PLAN:  PT FREQUENCY: 1-2x/week  PT DURATION: 6 weeks  PLANNED INTERVENTIONS: [id number]- PT Re-evaluation, 97110-Therapeutic exercises, 97530- Therapeutic activity, 97112- Neuromuscular re-education, 97535- Self  Care, 91478- Manual therapy, [phone]- Gait training, Patient/Family education, Balance training, Stair training, Taping, Dry Needling, Joint mobilization, Cryotherapy, and Moist heat  PLAN FOR NEXT SESSION: progress HEP. could consider DGI/FGA. Focus on quad/hamstring motor control in open and closed chain, functional mechanics. Symptom modification strategies as indicated/appropriate, mindful of cardiac history.    Solon Palm, PT  09/29/2023 12:34 PM

## 2023-09-29 NOTE — Progress Notes (Signed)
 [facility] Follow up visit  Patient Identification: Lindsay [name] MRN:  [id number] Date of Evaluation:  09/29/2023 Referral Source: primary care Chief Complaint:   Chief Complaint  Patient presents with   Follow-up  Depression, anxiety , alcohol use Visit Diagnosis:    ICD-10-CM   1. Mood disorder in conditions classified elsewhere  F06.30     2. Generalized anxiety disorder  F41.1     3. Alcohol use disorder, moderate, dependence (HCC)  F10.20       History of Present Illness: Patient is a 68 years old currently single Caucasian female initailly  referred by primary care physician to establish care for history of bipolar, depression and anxiety.  Patient has been in treatment many years ago but has not been taking any medication for more than 10 years and regarding to her bipolar or depression  On social security and working part time at [employer] it Doing fair and remains sober, therapy is helping   She has had multiple admission in the past for alcohol-related detox and depression.    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    Consequences of Substance Abuse: Medical Consequences:  discussed effect of alcohol to depression, judjement  Past Medical History:  Past Medical History:  Diagnosis Date   Abnormal mammogram    Thick Tissue   Acquired trigger finger of right ring finger 04/20/2022   Arthritis    Bilateral wrist pain 04/20/2022   Bipolar 1 disorder    Cataract    OU   Cervical radiculopathy  10/14/2021   Chalazion of left lower eyelid 04/15/2022   Chest pain 02/11/2015   Chronic atrial fibrillation 09/08/2015   Colon polyp    Compressed cervical disc    Coronary artery disease involving native coronary artery with angina pectoris 03/24/2015   Dorsalgia 09/22/2018   Dysphagia 09/22/2018   Essential hypertension 03/03/2015   Generalized anxiety disorder    GERD without esophagitis 09/22/2018   Hematemesis 12/18/2019   Hepatic steatosis 04/22/2022   History of appendectomy 1963   Hypertensive retinopathy    OU   Irritable bowel syndrome without diarrhea 09/22/2018   Lactic acidosis 12/18/2019   LLQ pain 04/15/2022   Major depressive disorder    Migraine without status migrainosus, not intractable 09/22/2018   Mixed hyperlipidemia 05/07/2016   Non-ST elevation MI (NSTEMI) 07/26/2008   No cardiac catheterization at the time.   Obstructive sleep apnea    no CPAP use   Orthostasis    Osteoarthritis of carpometacarpal (CMC) joint of thumb 04/20/2022   Osteoarthritis of knee 09/22/2018   PAF (paroxysmal atrial fibrillation) 10/09/2018   Pain in joint of right hip 07/03/2020   Pain in right knee 12/14/2018   Recurrent syncope    Rib pain 07/03/2020   Sciatica of right side 09/22/2018   Severe alcohol use disorder    Vitamin D deficiency     Past Surgical History:  Procedure Laterality Date   APPENDECTOMY     CARDIAC CATHETERIZATION N/A 03/25/2015   Procedure: Left Heart Cath and  Coronary Angiography;  Surgeon: Marykay Lex, MD;  Location: [facility] INVASIVE CV LAB;  Service: Cardiovascular;  Laterality: N/A;   CORONARY STENT INTERVENTION N/A 10/09/2018   Procedure: CORONARY STENT INTERVENTION;  Surgeon: Corky Crafts, MD;  Location: [facility] INVASIVE CV LAB;  Service: Cardiovascular;  Laterality: N/A;   DILATATION & CURETTAGE/HYSTEROSCOPY WITH MYOSURE N/A 05/19/2016   Procedure: DILATATION & CURETTAGE/HYSTEROSCOPY WITH MYOSURE;  Surgeon: Geryl Rankins, MD;  Location: [facility]  ORS;  Service: Gynecology;  Laterality: N/A;  Possible Myosure for polyps.   EYE SURGERY     LEFT HEART CATH AND CORONARY ANGIOGRAPHY N/A 10/09/2018   Procedure: LEFT HEART CATH AND CORONARY ANGIOGRAPHY;  Surgeon: Corky Crafts, MD;  Location: [facility] INVASIVE CV LAB;  Service: Cardiovascular;  Laterality: N/A;   MOUTH SURGERY     URETHRAL DILATION      Family Psychiatric History: cousin some mental illness, father possible depression   Family History:  Family History  Problem Relation Age of Onset   Vascular Disease Mother    Dementia Mother    Hypertension Mother    Stroke Mother    Depression Mother    Dementia Father    Heart attack Father    Stroke Father    Heart attack Maternal Grandmother    Heart attack Maternal Grandfather    Kidney failure Paternal Grandmother    Heart attack Paternal Grandfather     Social History:   Social History   Socioeconomic History   Marital status: Single    Spouse name: Not on file   Number of children: 0   Years of education: 16   Highest education level: Bachelor's degree (e.g., BA, AB, BS)  Occupational History   Not on file  Tobacco Use   Smoking status: Never   Smokeless tobacco: Never  Vaping Use   Vaping status: Never Used  Substance and Sexual Activity   Alcohol use: Yes    Comment: hx of severe alcohol abuse; most recent admission 02/2022   Drug use: Never   Sexual activity: Not Currently  Other Topics Concern   Not on file  Social History Narrative   Right handed   Drinks caffeine   Split level   Social Drivers of Health   Financial Resource Strain: High Risk (10/26/2022)   Overall Financial Resource Strain (CARDIA)    Difficulty of Paying Living Expenses: Hard  Food Insecurity: Food Insecurity Present (10/26/2022)   Hunger Vital Sign    Worried About Running Out of Food in the Last Year: Often true    Ran Out of Food in the Last Year: Sometimes true  Transportation Needs: Unmet Transportation Needs (10/26/2022)    PRAPARE - Transportation    Lack of Transportation (Medical): Yes    Lack of Transportation (Non-Medical): Yes  Physical Activity: Unknown (10/26/2022)   Exercise Vital Sign    Days of Exercise per Week: 0 days    Minutes of Exercise per Session: Not on file  Stress: Stress Concern Present (10/26/2022)   [employer] of Occupational Health - Occupational Stress Questionnaire    Feeling of Stress : Very much  Social Connections: Socially Isolated (10/26/2022)   Social Connection and Isolation Panel [NHANES]    Frequency of Communication with Friends and Family: More than three times a week    Frequency of Social Gatherings with Friends and Family: Once a week    Attends Religious Services: Never    Database administrator or Organizations: No    Attends [employer]  or Organization Meetings: Not on file    Marital Status: Never married    Additional Social History: grew up with parents, rough growing up or challenging with some conflicts with parents  Allergies:   Allergies  Allergen Reactions   Sulfa Antibiotics Swelling and Rash   Ciprofloxacin Nausea And Vomiting   Depakote [Divalproex Sodium] Other (See Comments)    Reaction not recalled- "it was 20 years ago"   Pine Itching   Risperidone Other (See Comments)    Unknown reaction   Seroquel [Quetiapine Fumarate] Other (See Comments)    VIVID nightmares   Statins Other (See Comments)    joint pain, weakness   Tramadol Other (See Comments)    Serotonin syndrome   Citalopram Rash and Other (See Comments)    Insomnia and agitation also   Lamotrigine Itching, Rash and Other (See Comments)    Stevens-Johnson syndrome and Brown urine, also   Other Other (See Comments) and Cough    Cat dander   Topiramate Rash and Other (See Comments)    Nightmares, brown urine also   Wasp Venom Dermatitis    Edema and redness in area of wasp sting   Zetia [Ezetimibe]     Joint pain, weakness     Metabolic Disorder Labs: No results found for:  "HGBA1C", "MPG" No results found for: "PROLACTIN" Lab Results  Component Value Date   CHOL 226 (H) 04/07/2023   TRIG 95 04/07/2023   HDL 81 04/07/2023   CHOLHDL 2.8 04/07/2023   VLDL 44 (H) 04/02/2016   LDLCALC 129 (H) 04/07/2023   LDLCALC 116 (H) 10/27/2022   Lab Results  Component Value Date   TSH 1.134 12/18/2019    Therapeutic Level Labs: No results found for: "LITHIUM" No results found for: "CBMZ" No results found for: "VALPROATE"  Current Medications: Current Outpatient Medications  Medication Sig Dispense Refill   acetaminophen (TYLENOL) 325 MG tablet Take 2 tablets (650 mg total) by mouth every 6 (six) hours as needed for mild pain (or Fever >/= 101).     amLODipine (NORVASC) 2.5 MG tablet Take 1 tablet (2.5 mg total) by mouth daily. 90 tablet 0   azelastine (OPTIVAR) 0.05 % ophthalmic solution INSTILL TWO DROPS IN [facility] EYE TWICE DAILY 6 mL 5   B Complex-C (SUPER B COMPLEX PO) Take by mouth.     BIOTIN PO Take 1 tablet by mouth daily.     Cholecalciferol 20 MCG (800 UNIT) TABS Take 1 tablet by mouth daily. 75 tablet 0   clopidogrel (PLAVIX) 75 MG tablet Take 1 tablet (75 mg total) by mouth daily. 90 tablet 3   Cyanocobalamin (B-12 PO) Take 1 tablet by mouth daily.     cyclobenzaprine (FLEXERIL) 5 MG tablet TAKE ONE TABLET BY MOUTH AT BEDTIME 30 tablet 0   diclofenac Sodium (VOLTAREN) 1 % GEL Apply topically as needed.     esomeprazole (NEXIUM 24HR CLEAR MINIS) 20 MG capsule Take 1 capsule (20 mg total) by mouth daily before breakfast. 30 capsule 1   Evolocumab (REPATHA SURECLICK) 140 MG/ML SOAJ Inject 140 mg into the skin every 14 (fourteen) days. 2 mL 5   HYDROcodone-acetaminophen (NORCO/VICODIN) 5-325 MG tablet Take 2 tablets by mouth as needed for moderate pain.     losartan (COZAAR) 50 MG tablet Take 1 tablet (50 mg total) by mouth daily. 90 tablet 2   metoprolol tartrate (LOPRESSOR) 25 MG tablet TAKE ONE-HALF TABLET BY MOUTH TWICE A DAY 90 tablet 3   nitroGLYCERIN  (  NITROSTAT) 0.4 MG SL tablet Place 1 tablet (0.4 mg total) under the tongue as needed for chest pain. 25 tablet 5   Omega-3 Fatty Acids (OMEGA 3 PO) Take 1 capsule by mouth daily.     Plant Sterols and Stanols (CHOLESTOFF PLUS PO) Take by mouth daily.     Probiotic Product (PROBIOTIC ADVANCED PO) Take by mouth daily.     buPROPion ER (WELLBUTRIN SR) 100 MG 12 hr tablet Take 1 tablet (100 mg total) by mouth 2 (two) times daily. 60 tablet 1   gabapentin (NEURONTIN) 100 MG capsule Take 1 capsule (100 mg total) by mouth 2 (two) times daily. 60 capsule 1   No current facility-administered medications for this visit.      Psychiatric Specialty Exam: Review of Systems  Cardiovascular:  Negative for chest pain.  Neurological:  Negative for tremors.  Psychiatric/Behavioral:  Negative for agitation and self-injury.     Blood pressure 130/79, pulse 70, height 5\' 2"  (1.575 m), weight 173 lb (78.5 kg).Body mass index is 31.64 kg/m.  General Appearance: Casual  Eye Contact:  Fair  Speech:  Clear and Coherent  Volume:  Normal  Mood: fair  Affect:  Constricted  Thought Process:  Goal Directed  Orientation:  oriented fully  Thought Content:  Rumination  Suicidal Thoughts:  No  Homicidal Thoughts:  No  Memory:  Immediate;   Fair  Judgement:  Fair  Insight:  Shallow  Psychomotor Activity:  Normal  Concentration:  Concentration: Fair  Recall:  Fair  Fund of Knowledge:Fair  Language: Fair  Akathisia:  No  Handed:    AIMS (if indicated):  no involuntary movements  Assets:  Desire for Improvement Social Support  ADL's:  Intact  Cognition: WNL  Sleep:  Fair   Screenings: GAD-7    Flowsheet Row Office Visit from 10/27/2022 in [facility] Primary Care & Sports Medicine at [facility] Office Visit from 06/29/2022 in [facility] Primary Care & Sports Medicine at [facility]  Total GAD-7 Score 5 19      PHQ2-9    Flowsheet Row Counselor from 05/10/2023 in [facility]  Outpatient Behavioral Health at [facility] Office Visit from 12/30/2022 in [facility] at [facility] Office Visit from 11/04/2022 in [facility] Primary Care & Sports Medicine at [facility] Office Visit from 10/27/2022 in [facility] Primary Care & Sports Medicine at [facility] Office Visit from 06/29/2022 in [facility] Primary Care & Sports Medicine at [facility] Total Score 2 2 5 3 6   PHQ-9 Total Score 18 9 -- 15 21      Flowsheet Row Counselor from 05/10/2023 in [facility] at [facility] ED from 04/09/2023 in [facility] Ur[facility] Care at [facility] Visit from 02/01/2023 in [facility] Outpatient Behavioral Health at [facility]  C-SSRS RISK CATEGORY No Risk No Risk Error: Q3, 4, or 5 should not be populated when Q2 is No       Assessment and Plan: as follows  Prior documentation reviewed  Bipolar disorder ; current episode depression:  Manageable on gaba helps with anxiety, mood and some pain aspects Wellbutrin has helped with depression  GAD improved, continue gaba  Alcohol use disorder; remains sober, continue relapse prevention  Sleep :doing fair without trazadone, continue sleep hygiene  FU  2 - 35m  earlier if needed, recommend therapy Direct care time  20 minutes including chart review, face to face, documentation   Collaboration  of Care: Primary Care Provider AEB reviewed notes and referral  Patient/Guardian was advised Release of Information must be obtained prior to any record release in order to collaborate their care with an outside provider. Patient/Guardian was advised if they have not already done so to contact the registration department to sign all necessary forms in order for [place] to release information regarding their care.   Consent: Patient/Guardian gives verbal consent for treatment and assignment of benefits for  services provided during this visit. Patient/Guardian expressed understanding and agreed to proceed.   Thresa Ross, MD 3/6/202512:38 PM

## 2023-10-04 ENCOUNTER — Ambulatory Visit (HOSPITAL_COMMUNITY): Payer: Medicare Other | Admitting: Licensed Clinical Social Worker

## 2023-10-04 ENCOUNTER — Encounter (HOSPITAL_COMMUNITY): Payer: Self-pay

## 2023-10-06 DIAGNOSIS — M25511 Pain in right shoulder: Secondary | ICD-10-CM | POA: Diagnosis not present

## 2023-10-11 ENCOUNTER — Telehealth: Payer: Self-pay | Admitting: Internal Medicine

## 2023-10-11 DIAGNOSIS — E785 Hyperlipidemia, unspecified: Secondary | ICD-10-CM

## 2023-10-11 NOTE — Telephone Encounter (Signed)
 Fasting lipid panel has been ordered. Patient notified via MyChart

## 2023-10-11 NOTE — Telephone Encounter (Signed)
 Patient has lipid appt with Dr. Rennis Golden on 3/25 she would like lab order put in so she can have her labs done before her appt.  Please send message via Mychart or call pt once orders are in.  [facility] [place]

## 2023-10-12 NOTE — Therapy (Signed)
 OUTPATIENT PHYSICAL THERAPY LOWER EXTREMITY TREATMENT   Patient Name: Paislea Hatton MRN: [id number] DOB:05-Oct-1955, 68 y.o., female Today's Date: 10/13/2023  END OF SESSION:  PT End of Session - 10/13/23 1318     Visit Number 4    Number of Visits 13    Date for PT Re-Evaluation 10/25/23    Authorization Type Medicare    Progress Note Due on Visit 10    PT Start Time 1319    PT Stop Time 1401    PT Time Calculation (min) 42 min    Activity Tolerance Patient tolerated treatment well    Behavior During Therapy [facility] for tasks assessed/performed                Past Medical History:  Diagnosis Date   Abnormal mammogram    Thick Tissue   Acquired trigger finger of right ring finger 04/20/2022   Arthritis    Bilateral wrist pain 04/20/2022   Bipolar 1 disorder    Cataract    OU   Cervical radiculopathy 10/14/2021   Chalazion of left lower eyelid 04/15/2022   Chest pain 02/11/2015   Chronic atrial fibrillation 09/08/2015   Colon polyp    Compressed cervical disc    Coronary artery disease involving native coronary artery with angina pectoris 03/24/2015   Dorsalgia 09/22/2018   Dysphagia 09/22/2018   Essential hypertension 03/03/2015   Generalized anxiety disorder    GERD without esophagitis 09/22/2018   Hematemesis 12/18/2019   Hepatic steatosis 04/22/2022   History of appendectomy 1963   Hypertensive retinopathy    OU   Irritable bowel syndrome without diarrhea 09/22/2018   Lactic acidosis 12/18/2019   LLQ pain 04/15/2022   Major depressive disorder    Migraine without status migrainosus, not intractable 09/22/2018   Mixed hyperlipidemia 05/07/2016   Non-ST elevation MI (NSTEMI) 07/26/2008   No cardiac catheterization at the time.   Obstructive sleep apnea    no CPAP use   Orthostasis    Osteoarthritis of carpometacarpal (CMC) joint of thumb 04/20/2022   Osteoarthritis of knee 09/22/2018   PAF (paroxysmal atrial fibrillation) 10/09/2018   Pain in joint of  right hip 07/03/2020   Pain in right knee 12/14/2018   Recurrent syncope    Rib pain 07/03/2020   Sciatica of right side 09/22/2018   Severe alcohol use disorder    Vitamin D deficiency    Past Surgical History:  Procedure Laterality Date   APPENDECTOMY     CARDIAC CATHETERIZATION N/A 03/25/2015   Procedure: Left Heart Cath and Coronary Angiography;  Surgeon: Marykay Lex, MD;  Location: [facility] INVASIVE CV LAB;  Service: Cardiovascular;  Laterality: N/A;   CORONARY STENT INTERVENTION N/A 10/09/2018   Procedure: CORONARY STENT INTERVENTION;  Surgeon: Corky Crafts, MD;  Location: [facility] INVASIVE CV LAB;  Service: Cardiovascular;  Laterality: N/A;   DILATATION & CURETTAGE/HYSTEROSCOPY WITH MYOSURE N/A 05/19/2016   Procedure: DILATATION & CURETTAGE/HYSTEROSCOPY WITH MYOSURE;  Surgeon: Geryl Rankins, MD;  Location: [facility];  Service: Gynecology;  Laterality: N/A;  Possible Myosure for polyps.   EYE SURGERY     LEFT HEART CATH AND CORONARY ANGIOGRAPHY N/A 10/09/2018   Procedure: LEFT HEART CATH AND CORONARY ANGIOGRAPHY;  Surgeon: Corky Crafts, MD;  Location: [facility] INVASIVE CV LAB;  Service: Cardiovascular;  Laterality: N/A;   MOUTH SURGERY     URETHRAL DILATION     Patient Active Problem List   Diagnosis Date Noted   Stress 02/02/2023   Insomnia 12/23/2022  Blood clotting disorder (HCC) 10/28/2022   Neck pain 10/27/2022   Acute thoracic back pain 10/27/2022   Lumbar radiculopathy 05/26/2022   Degeneration of lumbar intervertebral disc 05/26/2022   Major depressive disorder    Severe alcohol use disorder    Generalized anxiety disorder    Obstructive sleep apnea    Hepatic steatosis 04/22/2022   Osteoarthritis of carpometacarpal (CMC) joint of thumb 04/20/2022   Acquired trigger finger of right ring finger 04/20/2022   Bilateral wrist pain 04/20/2022   Chalazion of left lower eyelid 04/15/2022   Bipolar 1 disorder (HCC)    Cervical radiculopathy 10/14/2021   Pain in joint of  right hip 07/03/2020   Rib pain 07/03/2020   Recurrent syncope    Orthostasis    Lactic acidosis 12/18/2019   Hematemesis 12/18/2019   Pain in right knee 12/14/2018   PAF (paroxysmal atrial fibrillation) 10/09/2018   Dorsalgia 09/22/2018   Dysphagia 09/22/2018   GERD without esophagitis 09/22/2018   Irritable bowel syndrome without diarrhea 09/22/2018   Osteoarthritis of knee 09/22/2018   Polypharmacy 09/22/2018   Sciatica of right side 09/22/2018   Vitamin D deficiency 09/22/2018   Mixed hyperlipidemia 05/07/2016   Chronic atrial fibrillation (HCC) 09/08/2015   Coronary artery disease involving native coronary artery with angina pectoris 03/24/2015   Essential hypertension 03/03/2015   Chest pain 02/11/2015   Non-ST elevation MI (NSTEMI) (HCC) 07/26/2008    PCP: Chilton Si, MD  REFERRING PROVIDER: Elvin So, PA-C  REFERRING DIAG: [phone] (ICD-10-CM) - Pain in right knee  THERAPY DIAG:  Chronic pain of both knees  Difficulty in walking, not elsewhere classified  Muscle weakness (generalized)  Rationale for Evaluation and Treatment: Rehabilitation  ONSET DATE: 3 years ago  SUBJECTIVE:   SUBJECTIVE STATEMENT: My knees are terrible. I've been on my feet a lot in the kitchen. I haven't been doing my exercises.  Pt reports ~3 year history of BIL knee pain, R>L. Does not endorse any specific injury, reports this was preceded by job with more time on feet. States symptoms slowly seem to be worsening, knee will occasionally buckle. Tends to fluctuate, oftentimes based on work activities. Has fluctuating swelling as well through lateral R knee. She also endorses history of neck/UE pain, cramping in legs and arms for which she states she is in communication w/ providers. Also mentions a R shoulder subluxation a couple months ago, states she will be getting an MRI soon. Also reports seeing counselor weekly for mental health hx.   PERTINENT HISTORY: BPD1, afib,  CAD w/ chest pain, dysphagia, HTN, GAD, depression, migraine, NSTEMI, OSA, recurrent syncope  PAIN:  Are you having pain: 2-3/10 Location/description: R>L knee, posterior and anterior Best-worst over past week: 2-7/10  - aggravating factors: walking, standing ,stairs - Easing factors: pain ointment, pain medication  PRECAUTIONS: cardiac hx     WEIGHT BEARING RESTRICTIONS: No  FALLS:  Has patient fallen in last 6 months? No  LIVING ENVIRONMENT: Uses cane PRN for balance Lives w/ cats; lives in split level, 2 sets of stairs - BIL rails, 6-8 vs 8-10 steps on each  OCCUPATION: cashier  PLOF: Independent - enjoys gardening, art  PATIENT GOALS: build strength/muscle, get back to art  NEXT MD VISIT: TBD  OBJECTIVE:  Note: Objective measures were completed at Evaluation unless otherwise noted.  DIAGNOSTIC FINDINGS:  No recent imaging in chart - pt states she has had XR, endorses some degenerative changes   PATIENT SURVEYS:   09/20/23 LEFS 27 /  80 = 33.8 %  COGNITION: Overall cognitive status: Within functional limits for tasks assessed     SENSATION: NT  EDEMA:  Pt endorses fluctuating edema in lateral R knee    LOWER EXTREMITY ROM:      Right eval Left eval  Hip flexion    Hip extension    Hip internal rotation    Hip external rotation    Knee extension A: full   A: full  Knee flexion A: 119 deg   A: 124 deg   (Blank rows = not tested) (Key: WFL = within functional limits not formally assessed, * = concordant pain, s = stiffness/stretching sensation, NT = not tested)  Comments:    LOWER EXTREMITY MMT:    MMT Right eval Left eval  Hip flexion 4 4  Hip abduction (modified sitting) 5 5  Hip internal rotation    Hip external rotation    Knee flexion 4+ 5  Knee extension 4+ 5  Ankle dorsiflexion 4+ 5   (Blank rows = not tested) (Key: WFL = within functional limits not formally assessed, * = concordant pain, s = stiffness/stretching sensation, NT =  not tested)  Comments:    LOWER EXTREMITY SPECIAL TESTS:  Deferred given time constraints  FUNCTIONAL TESTS:  5xSTS: 11.69sec w/ mild pain  TUG: 10.27sec w/o AD, antalgic gait  GAIT: Distance walked: within clinic Assistive device utilized: None Level of assistance: Complete Independence Comments: antalgic gait R>L, reduced gait speed/cadence                                                                                                                                TREATMENT DATE:   [facility] Adult PT Treatment:                                                DATE: 10/13/23  Therapeutic Exercise: Nustep L5 x 7 min at end of session for increased mobility Seated clamshell x 20 Blue band Seated LAQ B x 20  Neuromuscular re-ed: Supine quad set 5 sec hold x 20 - cues to avoid buttocks SLR B x 15 Supine SAQ 5 sec hold x 20  [facility] Adult PT Treatment:                                                DATE: 09/29/23  Therapeutic Exercise: Nustep L5 x 5.5 min status discussed Seated clamshell x 20 Blue band Bridge 10 sec x 5, then with blue band +clam x 10 Seated LAQ B x 20 Prone knee flexion x 10 B Prone knee flexion stretch x 30 sec B  Neuromuscular re-ed: Supine quad set 5 sec hold  x 10 - cues to avoid buttocks Prone quad set 5 sec hold x 10 (supine is better) Supine SAQ 5 sec hold x 20 Supine SLR and with ER - unable to do so moved to S/L SLR B x 15  Therapeutic Activity: Sit to stand x 10 with blue band, sit to stand 8# x 15     [facility] Adult PT Treatment:                                                DATE: 09/20/23 LEFS  27 / 80 = 33.8 % Therapeutic Exercise: Nustep L5 x 5 min status discussed Seated clamshell x 20 Blue band Bridge 10 sec x 5 Seated LAQ B x 20  Neuromuscular re-ed: Supine quad set 5 sec hold x 20 - cues to avoid buttocks Supine SAQ 5 sec hold x 20 Supine SLR and with ER - unable to do so moved to S/L S/L SLR R x 20 S/L hip ABD x 20 Standing hip ext  with resistance Standing hip diagonal with resistance  Therapeutic Activity: LEFS completed while on Nustep Sit to stand x 10, sit to stand 10# x 10 - crepitis Functional squat x 10    [facility] Adult PT Treatment:                                                DATE: 09/13/23 Deferred given time constraints   PATIENT EDUCATION:  Education details: HEP established Person educated: Patient Education method: Explanation, Demonstration, Tactile cues, Verbal cues Education comprehension: verbalized understanding, returned demonstration, verbal cues required, tactile cues required, and needs further education    HOME EXERCISE PROGRAM: Access Code: [email or web address] URL: [email or web address] Date: 10/13/2023 Prepared by: Raynelle Fanning  Exercises - Supine Quadricep Sets  - 1 x daily - 7 x weekly - 2 sets - 15 reps - 5 second hold - Supine Knee Extension Strengthening  - 1 x daily - 7 x weekly - 2 sets - 15 reps - Sidelying Hip Flexion  - 1 x daily - 3 x weekly - 2 sets - 15 reps - Sidelying Hip Abduction  - 1 x daily - 3 x weekly - 2 sets - 15 reps - Squat with Chair Touch  - 1 x daily - 3 x weekly - 1-2 sets - 10 reps - Seated Long Arc Quad  - 1 x daily - 7 x weekly - 22 sets - 15 reps - Supine Bridge  - 1 x daily - 7 x weekly - 1-3 sets - 10 reps - 10 sec hold - Supine Active Straight Leg Raise  - 2 x daily - 7 x weekly - 3 sets - 10 reps - Seated Hip Abduction with Resistance  - 1 x daily - 3 x weekly - 2 sets - 10 reps  ASSESSMENT:  CLINICAL IMPRESSION: Talley presents today after a long break reporting increased knee pain. She did not do her exercises because of the pain. She tolerated all exercises today with no complaint of pain. HEP progressed with SLR and resisted ABD. Patient reported decreased pain at end of session. Patient continues to demonstrate potential for improvement and would benefit from continued skilled therapy to address impairments.  OBJECTIVE IMPAIRMENTS: Abnormal  gait, decreased activity tolerance, decreased balance, decreased endurance, decreased mobility, difficulty walking, decreased ROM, decreased strength, and pain.   ACTIVITY LIMITATIONS: carrying, lifting, bending, standing, squatting, sleeping, stairs, transfers, and locomotion level  PARTICIPATION LIMITATIONS: meal prep, cleaning, laundry, community activity, and occupation  PERSONAL FACTORS: Age, Time since onset of injury/illness/exacerbation, and 3+ comorbidities: BPD1, afib, CAD w/ chest pain, dysphagia, HTN, GAD, depression, migraine, NSTEMI, OSA, recurrent syncope  are also affecting patient's functional outcome.   REHAB POTENTIAL: Good  CLINICAL DECISION MAKING: Evolving/moderate complexity  EVALUATION COMPLEXITY: Moderate   GOALS:  SHORT TERM GOALS: Target date: 10/04/2023 Pt will demonstrate appropriate understanding and performance of initially prescribed HEP in order to facilitate improved independence with management of symptoms.  Baseline: HEP TBD  Goal status: IN PROGRESS  2. Pt will report at least 25% improvement in overall pain levels over past week in order to facilitate improved tolerance to typical daily activities.   Baseline: 2-7/10  Goal status: IN PROGRESS  LONG TERM GOALS: Target date: 10/25/2023 Pt will improve MCID or greater on LEFS in order to demonstrate improved perception of function due to symptoms (MCID 9 pts) Baseline: LEFS TBD Goal status: INITIAL  2.  Pt will demonstrate 5/5 MMT BIL knees/ankles for improved functional strength.  Baseline: See MMT chart above Goal status: INITIAL  3.  Pt will report no more than 4/10 knee pain on NPS with average work day in order to facilitate improved tolerance to work tasks.  Baseline: increased pain at end of work day, up to 7/10 Goal status: INITIAL  4.  Pt will be able to perform 5xSTS in less than or equal to 9sec in order to demonstrate reduced fall risk and improved functional independence (MCID 5xSTS  = 2.3 sec). Baseline: 11sec Goal status: INITIAL   5. Pt will report at least 50% decrease in overall pain levels in past week in order to facilitate improved tolerance to basic ADLs/mobility.   Baseline: 2-7/10  Goal status: INITIAL    6. Pt will demonstrate appropriate performance of final prescribed HEP in order to facilitate improved self-management of symptoms post-discharge.   Baseline: HEP TBD  Goal status: INITIAL     PLAN:  PT FREQUENCY: 1-2x/week  PT DURATION: 6 weeks  PLANNED INTERVENTIONS: 97164- PT Re-evaluation, 97110-Therapeutic exercises, 97530- Therapeutic activity, 97112- Neuromuscular re-education, 97535- Self Care, 16109- Manual therapy, [phone]- Gait training, Patient/Family education, Balance training, Stair training, Taping, Dry Needling, Joint mobilization, Cryotherapy, and Moist heat  PLAN FOR NEXT SESSION: progress HEP. could consider DGI/FGA. Focus on quad/hamstring motor control in open and closed chain, functional mechanics. Symptom modification strategies as indicated/appropriate, mindful of cardiac history.    Solon Palm, PT  10/13/2023 2:07 PM

## 2023-10-13 ENCOUNTER — Ambulatory Visit: Admitting: Physical Therapy

## 2023-10-13 ENCOUNTER — Encounter: Payer: Self-pay | Admitting: Physical Therapy

## 2023-10-13 DIAGNOSIS — G8929 Other chronic pain: Secondary | ICD-10-CM

## 2023-10-13 DIAGNOSIS — M25562 Pain in left knee: Secondary | ICD-10-CM | POA: Diagnosis not present

## 2023-10-13 DIAGNOSIS — R262 Difficulty in walking, not elsewhere classified: Secondary | ICD-10-CM

## 2023-10-13 DIAGNOSIS — M25561 Pain in right knee: Secondary | ICD-10-CM | POA: Diagnosis not present

## 2023-10-13 DIAGNOSIS — M6281 Muscle weakness (generalized): Secondary | ICD-10-CM | POA: Diagnosis not present

## 2023-10-13 DIAGNOSIS — M19111 Post-traumatic osteoarthritis, right shoulder: Secondary | ICD-10-CM | POA: Diagnosis not present

## 2023-10-14 ENCOUNTER — Telehealth: Payer: Self-pay

## 2023-10-14 DIAGNOSIS — E785 Hyperlipidemia, unspecified: Secondary | ICD-10-CM | POA: Diagnosis not present

## 2023-10-14 NOTE — Telephone Encounter (Signed)
   Pre-operative Risk Assessment    Patient Name: Lindsay [name]  DOB: 1956/06/06 MRN: [id number]   Date of last office visit: 07/18/23 Gillian Shields, NP Date of next office visit: NONE   Request for Surgical Clearance    Procedure:   RIGHT REVERSE TOTAL SHOULDER ARTHROPLASTY  Date of Surgery:  Clearance TBD                                Surgeon:  DR Malon Kindle Surgeon's Group or Practice Name:  [facility] Phone number:  [phone] ATTN: KERRI MAZE Fax number:  [phone]   Type of Clearance Requested:   - Medical  - Pharmacy:  Hold Clopidogrel (Plavix)     Type of Anesthesia:   CHOICE   Additional requests/questions:    Signed, Marlow Baars   10/14/2023, 1:17 PM

## 2023-10-15 LAB — LIPID PANEL
Chol/HDL Ratio: 2.9 ratio (ref 0.0–4.4)
Cholesterol, Total: 189 mg/dL (ref 100–199)
HDL: 66 mg/dL (ref >39)
LDL Chol Calc (NIH): 106 mg/dL — ABNORMAL HIGH (ref 0–99)
Triglycerides: 97 mg/dL (ref 0–149)
VLDL Cholesterol Cal: 17 mg/dL (ref 5–40)

## 2023-10-17 ENCOUNTER — Telehealth: Payer: Self-pay

## 2023-10-17 NOTE — Therapy (Signed)
 OUTPATIENT PHYSICAL THERAPY LOWER EXTREMITY TREATMENT   Patient Name: Lindsay [name] MRN: [id number] DOB:[birth year], 68 y.o., female Today's Date: 10/18/2023  END OF SESSION:  PT End of Session - 10/18/23 1016     Visit Number 5    Number of Visits 13    Date for PT Re-Evaluation 10/25/23    Authorization Type Medicare    Progress Note Due on Visit 10    PT Start Time 1017    PT Stop Time 1058    PT Time Calculation (min) 41 min    Activity Tolerance Patient tolerated treatment well    Behavior During Therapy [facility] for tasks assessed/performed                 Past Medical History:  Diagnosis Date   Abnormal mammogram    Thick Tissue   Acquired trigger finger of right ring finger 04/20/2022   Arthritis    Bilateral wrist pain 04/20/2022   Bipolar 1 disorder    Cataract    OU   Cervical radiculopathy 10/14/2021   Chalazion of left lower eyelid 04/15/2022   Chest pain 02/11/2015   Chronic atrial fibrillation 09/08/2015   Colon polyp    Compressed cervical disc    Coronary artery disease involving native coronary artery with angina pectoris 03/24/2015   Dorsalgia 09/22/2018   Dysphagia 09/22/2018   Essential hypertension 03/03/2015   Generalized anxiety disorder    GERD without esophagitis 09/22/2018   Hematemesis 12/18/2019   Hepatic steatosis 04/22/2022   History of appendectomy 1963   Hypertensive retinopathy    OU   Irritable bowel syndrome without diarrhea 09/22/2018   Lactic acidosis 12/18/2019   LLQ pain 04/15/2022   Major depressive disorder    Migraine without status migrainosus, not intractable 09/22/2018   Mixed hyperlipidemia 05/07/2016   Non-ST elevation MI (NSTEMI) 07/26/2008   No cardiac catheterization at the time.   Obstructive sleep apnea    no CPAP use   Orthostasis    Osteoarthritis of carpometacarpal (CMC) joint of thumb 04/20/2022   Osteoarthritis of knee 09/22/2018   PAF (paroxysmal atrial fibrillation) 10/09/2018   Pain in joint  of right hip 07/03/2020   Pain in right knee 12/14/2018   Recurrent syncope    Rib pain 07/03/2020   Sciatica of right side 09/22/2018   Severe alcohol use disorder    Vitamin D deficiency    Past Surgical History:  Procedure Laterality Date   APPENDECTOMY     CARDIAC CATHETERIZATION N/A 03/25/2015   Procedure: Left Heart Cath and Coronary Angiography;  Surgeon: Marykay Lex, MD;  Location: [facility] INVASIVE CV LAB;  Service: Cardiovascular;  Laterality: N/A;   CORONARY STENT INTERVENTION N/A 10/09/2018   Procedure: CORONARY STENT INTERVENTION;  Surgeon: Corky Crafts, MD;  Location: [facility] INVASIVE CV LAB;  Service: Cardiovascular;  Laterality: N/A;   DILATATION & CURETTAGE/HYSTEROSCOPY WITH MYOSURE N/A 05/19/2016   Procedure: DILATATION & CURETTAGE/HYSTEROSCOPY WITH MYOSURE;  Surgeon: Geryl Rankins, MD;  Location: [facility];  Service: Gynecology;  Laterality: N/A;  Possible Myosure for polyps.   EYE SURGERY     LEFT HEART CATH AND CORONARY ANGIOGRAPHY N/A 10/09/2018   Procedure: LEFT HEART CATH AND CORONARY ANGIOGRAPHY;  Surgeon: Corky Crafts, MD;  Location: [facility] INVASIVE CV LAB;  Service: Cardiovascular;  Laterality: N/A;   MOUTH SURGERY     URETHRAL DILATION     Patient Active Problem List   Diagnosis Date Noted   Stress 02/02/2023   Insomnia 12/23/2022  Blood clotting disorder (HCC) 10/28/2022   Neck pain 10/27/2022   Acute thoracic back pain 10/27/2022   Lumbar radiculopathy 05/26/2022   Degeneration of lumbar intervertebral disc 05/26/2022   Major depressive disorder    Severe alcohol use disorder    Generalized anxiety disorder    Obstructive sleep apnea    Hepatic steatosis 04/22/2022   Osteoarthritis of carpometacarpal (CMC) joint of thumb 04/20/2022   Acquired trigger finger of right ring finger 04/20/2022   Bilateral wrist pain 04/20/2022   Chalazion of left lower eyelid 04/15/2022   Bipolar 1 disorder (HCC)    Cervical radiculopathy 10/14/2021   Pain in joint  of right hip 07/03/2020   Rib pain 07/03/2020   Recurrent syncope    Orthostasis    Lactic acidosis 12/18/2019   Hematemesis 12/18/2019   Pain in right knee 12/14/2018   PAF (paroxysmal atrial fibrillation) 10/09/2018   Dorsalgia 09/22/2018   Dysphagia 09/22/2018   GERD without esophagitis 09/22/2018   Irritable bowel syndrome without diarrhea 09/22/2018   Osteoarthritis of knee 09/22/2018   Polypharmacy 09/22/2018   Sciatica of right side 09/22/2018   Vitamin D deficiency 09/22/2018   Mixed hyperlipidemia 05/07/2016   Chronic atrial fibrillation (HCC) 09/08/2015   Coronary artery disease involving native coronary artery with angina pectoris 03/24/2015   Essential hypertension 03/03/2015   Chest pain 02/11/2015   Non-ST elevation MI (NSTEMI) (HCC) 07/26/2008    PCP: Chilton Si, MD  REFERRING PROVIDER: Elvin So, PA-C  REFERRING DIAG: [phone] (ICD-10-CM) - Pain in right knee  THERAPY DIAG:  Chronic pain of both knees  Difficulty in walking, not elsewhere classified  Muscle weakness (generalized)  Rationale for Evaluation and Treatment: Rehabilitation  ONSET DATE: 3 years ago  SUBJECTIVE:   SUBJECTIVE STATEMENT: I went for a walk! No pain today. Trying not to sit or stand too long. I'm not going to have my shoulder surgery.   Pt reports ~3 year history of BIL knee pain, R>L. Does not endorse any specific injury, reports this was preceded by job with more time on feet. States symptoms slowly seem to be worsening, knee will occasionally buckle. Tends to fluctuate, oftentimes based on work activities. Has fluctuating swelling as well through lateral R knee. She also endorses history of neck/UE pain, cramping in legs and arms for which she states she is in communication w/ providers. Also mentions a R shoulder subluxation a couple months ago, states she will be getting an MRI soon. Also reports seeing counselor weekly for mental health hx.   PERTINENT  HISTORY: BPD1, afib, CAD w/ chest pain, dysphagia, HTN, GAD, depression, migraine, NSTEMI, OSA, recurrent syncope  PAIN:  Are you having pain: 010 Location/description: R>L knee, posterior and anterior Best-worst over past week: 2-7/10  - aggravating factors: walking, standing ,stairs - Easing factors: pain ointment, pain medication  PRECAUTIONS: cardiac hx     WEIGHT BEARING RESTRICTIONS: No  FALLS:  Has patient fallen in last 6 months? No  LIVING ENVIRONMENT: Uses cane PRN for balance Lives w/ cats; lives in split level, 2 sets of stairs - BIL rails, 6-8 vs 8-10 steps on each  OCCUPATION: cashier  PLOF: Independent - enjoys gardening, art  PATIENT GOALS: build strength/muscle, get back to art  NEXT MD VISIT: TBD  OBJECTIVE:  Note: Objective measures were completed at Evaluation unless otherwise noted.  DIAGNOSTIC FINDINGS:  No recent imaging in chart - pt states she has had XR, endorses some degenerative changes   PATIENT SURVEYS:  09/20/23 LEFS 27 / 80 = 33.8 %  COGNITION: Overall cognitive status: Within functional limits for tasks assessed     SENSATION: NT  EDEMA:  Pt endorses fluctuating edema in lateral R knee    LOWER EXTREMITY ROM:      Right eval Left eval  Hip flexion    Hip extension    Hip internal rotation    Hip external rotation    Knee extension A: full   A: full  Knee flexion A: 119 deg   A: 124 deg   (Blank rows = not tested) (Key: WFL = within functional limits not formally assessed, * = concordant pain, s = stiffness/stretching sensation, NT = not tested)  Comments:    LOWER EXTREMITY MMT:    MMT Right eval Left eval  Hip flexion 4 4  Hip abduction (modified sitting) 5 5  Hip internal rotation    Hip external rotation    Knee flexion 4+ 5  Knee extension 4+ 5  Ankle dorsiflexion 4+ 5   (Blank rows = not tested) (Key: WFL = within functional limits not formally assessed, * = concordant pain, s =  stiffness/stretching sensation, NT = not tested)  Comments:    LOWER EXTREMITY SPECIAL TESTS:  Deferred given time constraints  FUNCTIONAL TESTS:  5xSTS: 11.69sec w/ mild pain  TUG: 10.27sec w/o AD, antalgic gait  GAIT: Distance walked: within clinic Assistive device utilized: None Level of assistance: Complete Independence Comments: antalgic gait R>L, reduced gait speed/cadence                                                                                                                                TREATMENT DATE:  [facility] Adult PT Treatment:                                                DATE: 10/18/23  Therapeutic Exercise: Nustep L5 x 6 min at end of session for increased mobility Seated clamshell x 20 Blue band Seated LAQ B x 20 (add wt next time) Bridge with blue band +clam 2 x 10 Standing knee flexion x 20 B Standing heel raises x 20  Neuromuscular re-ed: SLR B x 15 Supine SAQ 5 sec hold x 20 Standing hip ABD x 10 B Standing hip Ext x 10 B  Therapeutic Activity: Sit to stand  8# x 20   [facility] Adult PT Treatment:                                                DATE: 10/13/23  Therapeutic Exercise: Nustep L5 x 7 min at end of session for increased mobility Seated clamshell x 20 Blue  band Seated LAQ B x 20  Neuromuscular re-ed: Supine quad set 5 sec hold x 20 - cues to avoid buttocks SLR B x 15 Supine SAQ 5 sec hold x 20  [facility] Adult PT Treatment:                                                DATE: 09/29/23  Therapeutic Exercise: Nustep L5 x 5.5 min status discussed Seated clamshell x 20 Blue band Bridge 10 sec x 5, then with blue band +clam x 10 Seated LAQ B x 20 Prone knee flexion x 10 B Prone knee flexion stretch x 30 sec B  Neuromuscular re-ed: Supine quad set 5 sec hold x 10 - cues to avoid buttocks Prone quad set 5 sec hold x 10 (supine is better) Supine SAQ 5 sec hold x 20 Supine SLR and with ER - unable to do so moved to S/L SLR B x  15  Therapeutic Activity: Sit to stand x 10 with blue band, sit to stand 8# x 15     [facility] Adult PT Treatment:                                                DATE: 09/20/23 LEFS  27 / 80 = 33.8 % Therapeutic Exercise: Nustep L5 x 5 min status discussed Seated clamshell x 20 Blue band Bridge 10 sec x 5 Seated LAQ B x 20  Neuromuscular re-ed: Supine quad set 5 sec hold x 20 - cues to avoid buttocks Supine SAQ 5 sec hold x 20 Supine SLR and with ER - unable to do so moved to S/L S/L SLR R x 20 S/L hip ABD x 20 Standing hip ext with resistance Standing hip diagonal with resistance  Therapeutic Activity: LEFS completed while on Nustep Sit to stand x 10, sit to stand 10# x 10 - crepitis Functional squat x 10    [facility] Adult PT Treatment:                                                DATE: 09/13/23 Deferred given time constraints   PATIENT EDUCATION:  Education details: HEP established Person educated: Patient Education method: Explanation, Demonstration, Tactile cues, Verbal cues Education comprehension: verbalized understanding, returned demonstration, verbal cues required, tactile cues required, and needs further education    HOME EXERCISE PROGRAM: Access Code: [email or web address] URL: [email or web address] Date: 10/13/2023 Prepared by: Raynelle Fanning  Exercises - Supine Quadricep Sets  - 1 x daily - 7 x weekly - 2 sets - 15 reps - 5 second hold - Supine Knee Extension Strengthening  - 1 x daily - 7 x weekly - 2 sets - 15 reps - Sidelying Hip Flexion  - 1 x daily - 3 x weekly - 2 sets - 15 reps - Sidelying Hip Abduction  - 1 x daily - 3 x weekly - 2 sets - 15 reps - Squat with Chair Touch  - 1 x daily - 3 x weekly - 1-2 sets - 10 reps - Seated Long Arc Quad  - 1 x daily -  7 x weekly - 22 sets - 15 reps - Supine Bridge  - 1 x daily - 7 x weekly - 1-3 sets - 10 reps - 10 sec hold - Supine Active Straight Leg Raise  - 2 x daily - 7 x weekly - 3 sets - 10 reps - Seated Hip  Abduction with Resistance  - 1 x daily - 3 x weekly - 2 sets - 10 reps  ASSESSMENT:  CLINICAL IMPRESSION: Vi presents with no pain today and was able to go for a walk yesterday. She was able to tolerate all exercises today with no increase in pain. Standing exercises are more challenging on the R. She should be able to progress with resistance on some exercises next visit. Jia continues to demonstrate potential for improvement and would benefit from continued skilled therapy to address impairments.     OBJECTIVE IMPAIRMENTS: Abnormal gait, decreased activity tolerance, decreased balance, decreased endurance, decreased mobility, difficulty walking, decreased ROM, decreased strength, and pain.   ACTIVITY LIMITATIONS: carrying, lifting, bending, standing, squatting, sleeping, stairs, transfers, and locomotion level  PARTICIPATION LIMITATIONS: meal prep, cleaning, laundry, community activity, and occupation  PERSONAL FACTORS: Age, Time since onset of injury/illness/exacerbation, and 3+ comorbidities: BPD1, afib, CAD w/ chest pain, dysphagia, HTN, GAD, depression, migraine, NSTEMI, OSA, recurrent syncope  are also affecting patient's functional outcome.   REHAB POTENTIAL: Good  CLINICAL DECISION MAKING: Evolving/moderate complexity  EVALUATION COMPLEXITY: Moderate   GOALS:  SHORT TERM GOALS: Target date: 10/04/2023 Pt will demonstrate appropriate understanding and performance of initially prescribed HEP in order to facilitate improved independence with management of symptoms.  Baseline: HEP TBD  Goal status: IN PROGRESS  2. Pt will report at least 25% improvement in overall pain levels over past week in order to facilitate improved tolerance to typical daily activities.   Baseline: 2-7/10  Goal status: IN PROGRESS  LONG TERM GOALS: Target date: 10/25/2023 Pt will improve MCID or greater on LEFS in order to demonstrate improved perception of function due to symptoms (MCID 9  pts) Baseline: LEFS TBD Goal status: INITIAL  2.  Pt will demonstrate 5/5 MMT BIL knees/ankles for improved functional strength.  Baseline: See MMT chart above Goal status: INITIAL  3.  Pt will report no more than 4/10 knee pain on NPS with average work day in order to facilitate improved tolerance to work tasks.  Baseline: increased pain at end of work day, up to 7/10 Goal status: INITIAL  4.  Pt will be able to perform 5xSTS in less than or equal to 9sec in order to demonstrate reduced fall risk and improved functional independence (MCID 5xSTS = 2.3 sec). Baseline: 11sec Goal status: INITIAL   5. Pt will report at least 50% decrease in overall pain levels in past week in order to facilitate improved tolerance to basic ADLs/mobility.   Baseline: 2-7/10  Goal status: INITIAL    6. Pt will demonstrate appropriate performance of final prescribed HEP in order to facilitate improved self-management of symptoms post-discharge.   Baseline: HEP TBD  Goal status: INITIAL     PLAN:  PT FREQUENCY: 1-2x/week  PT DURATION: 6 weeks  PLANNED INTERVENTIONS: 97164- PT Re-evaluation, 97110-Therapeutic exercises, 97530- Therapeutic activity, 97112- Neuromuscular re-education, 97535- Self Care, 16109- Manual therapy, [phone]- Gait training, Patient/Family education, Balance training, Stair training, Taping, Dry Needling, Joint mobilization, Cryotherapy, and Moist heat  PLAN FOR NEXT SESSION: progress HEP. could consider DGI/FGA. Focus on quad/hamstring motor control in open and closed chain, functional mechanics. Symptom modification  strategies as indicated/appropriate, mindful of cardiac history.    Solon Palm, PT  10/18/2023 11:02 AM

## 2023-10-17 NOTE — Telephone Encounter (Signed)
 Pre-op form placed in  in basket , pt needs an appt

## 2023-10-18 ENCOUNTER — Encounter: Payer: Self-pay | Admitting: Pharmacy Technician

## 2023-10-18 ENCOUNTER — Ambulatory Visit (INDEPENDENT_AMBULATORY_CARE_PROVIDER_SITE_OTHER): Payer: Medicare Other | Admitting: Licensed Clinical Social Worker

## 2023-10-18 ENCOUNTER — Encounter: Payer: Self-pay | Admitting: Physical Therapy

## 2023-10-18 ENCOUNTER — Ambulatory Visit: Admitting: Physical Therapy

## 2023-10-18 ENCOUNTER — Ambulatory Visit (HOSPITAL_BASED_OUTPATIENT_CLINIC_OR_DEPARTMENT_OTHER): Admitting: Internal Medicine

## 2023-10-18 ENCOUNTER — Telehealth: Payer: Self-pay | Admitting: Pharmacy Technician

## 2023-10-18 VITALS — BP 128/88 | HR 81 | Ht 62.0 in | Wt 175.2 lb

## 2023-10-18 DIAGNOSIS — F1020 Alcohol dependence, uncomplicated: Secondary | ICD-10-CM | POA: Diagnosis not present

## 2023-10-18 DIAGNOSIS — M25561 Pain in right knee: Secondary | ICD-10-CM | POA: Diagnosis not present

## 2023-10-18 DIAGNOSIS — M791 Myalgia, unspecified site: Secondary | ICD-10-CM

## 2023-10-18 DIAGNOSIS — E785 Hyperlipidemia, unspecified: Secondary | ICD-10-CM | POA: Diagnosis not present

## 2023-10-18 DIAGNOSIS — F0630 Mood disorder due to known physiological condition, unspecified: Secondary | ICD-10-CM

## 2023-10-18 DIAGNOSIS — M6281 Muscle weakness (generalized): Secondary | ICD-10-CM | POA: Diagnosis not present

## 2023-10-18 DIAGNOSIS — M25562 Pain in left knee: Secondary | ICD-10-CM | POA: Diagnosis not present

## 2023-10-18 DIAGNOSIS — I25119 Atherosclerotic heart disease of native coronary artery with unspecified angina pectoris: Secondary | ICD-10-CM | POA: Diagnosis not present

## 2023-10-18 DIAGNOSIS — R262 Difficulty in walking, not elsewhere classified: Secondary | ICD-10-CM | POA: Diagnosis not present

## 2023-10-18 DIAGNOSIS — T466X5A Adverse effect of antihyperlipidemic and antiarteriosclerotic drugs, initial encounter: Secondary | ICD-10-CM

## 2023-10-18 DIAGNOSIS — G8929 Other chronic pain: Secondary | ICD-10-CM | POA: Diagnosis not present

## 2023-10-18 DIAGNOSIS — T466X5D Adverse effect of antihyperlipidemic and antiarteriosclerotic drugs, subsequent encounter: Secondary | ICD-10-CM

## 2023-10-18 DIAGNOSIS — F411 Generalized anxiety disorder: Secondary | ICD-10-CM | POA: Diagnosis not present

## 2023-10-18 NOTE — Patient Instructions (Signed)
 Medication Instructions:  NO CHANGES  Lab Work: FASTING lab work in 1 year  Follow-Up: At [facility], you and your health needs are our priority.  As part of our continuing mission to provide you with exceptional heart care, we have created designated Provider Care Teams.  These Care Teams include your primary Cardiologist (physician) and Advanced Practice Providers (APPs -  Physician Assistants and Nurse Practitioners) who all work together to provide you with the care you need, when you need it.  We recommend signing up for the patient portal called "MyChart".  Sign up information is provided on this After Visit Summary.  MyChart is used to connect with patients for Virtual Visits (Telemedicine).  Patients are able to view lab/test results, encounter notes, upcoming appointments, etc.  Non-urgent messages can be sent to your provider as well.   To learn more about what you can do with MyChart, go to [email or web address].    Your next appointment:   12 months with [name] Bridegroom NP - lipid clinic

## 2023-10-18 NOTE — Telephone Encounter (Signed)
 Patient Advocate Encounter   The patient was approved for a [employer] grant that will help cover the cost of repatha Total amount awarded, 2500.  Effective: 10/07/23 - 10/05/24   HYQ:[id number] NGE:[id number] [id number] [id number] [employer] ID: [id number]   Pharmacy provided with approval and processing information. Patient informed via mychart    Pt just got today so this is saved for next time

## 2023-10-18 NOTE — Progress Notes (Signed)
 LIPID CLINIC CONSULT NOTE  Chief Complaint:  Follow-up dyslipidemia, chest pain  Primary Care Physician: Charlton Amor, DO  Primary Cardiologist:  Chilton Si, MD  HPI:  Lindsay [name] is a 67 y.o. female who is being seen today for the evaluation of dyslipidemia at the request of Wachs, Erika S, DO.  This is a pleasant 68 year old female who presents for evaluation and management of dyslipidemia.  Past medical history significant for atrial fibrillation, coronary artery disease with prior intervention, myocardial infarction, hypertension and bipolar disorder.  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  She is followed closely by Dr. Duke Salvia and noted to have a dyslipidemia above target LDL less than 70 but has been intolerant to statins and ezetimibe causing significant myalgias.  Her last lipid profile is actually quite old from March 2020 showing total cholesterol 287, HDL 59, LDL of 204 and triglycerides 121.  She reports a varied diet not necessarily low in saturated fats.  There is a strong family history of heart disease and heart attacks mostly on the mother side of the family including several aunts and uncles which is highly suggestive of familial hyperlipidemia.  11/12/2021  Lindsay [name] returns today for follow-up.  She recently got back from a trip to [place].  She reports that she has not recently taken her Repatha for the past month and a half due to not being able to for the medication.  We were able to get her health well grant approved, however she apparently lost the envelope which contain the co-pay card.  We will provide her with that information today.  She has not had recent lipid testing however since she has been off the medication it would be of little value to do that testing today.  She also reported she had chest pain a few days ago, but has not had any since.  01/28/2023  Lindsay [name] is seen today in follow-up.   Fortunately she had some acute pharyngitis which she was seen for earlier this morning, believed to be bacterial.  Otherwise she says she has been doing well.  She did take a trip to [place].  She had repeat lipid testing in April which showed an LDL of 116.  It was unclear if she was on her Repatha at that time but she believes she was.  She was able to get a grant for the medication and has been paying nothing for the medicine.  I would like to get repeat lipid testing.  I do not believe she has ever had an LP(a).  10/18/2023  Lindsay [name] is seen today in follow-up.  Her cholesterol is further improved on Repatha with dietary and lifestyle changes.  Total cholesterol now 189 with triglycerides 97, HDL 66 and LDL 106, substantial improvement compared to 220 about 3 years ago.  Overall she remains asymptomatic denying chest pain or shortness of breath.  Blood pressure is good today 128/88.  She does follow with Dr. Duke Salvia for this.  LP(a) was previously tested and is negative.  PMHx:  Past Medical History:  Diagnosis Date   Abnormal mammogram    Thick Tissue   Acquired trigger finger of right ring finger 04/20/2022   Arthritis    Bilateral wrist pain 04/20/2022   Bipolar 1 disorder    Cataract    OU   Cervical radiculopathy 10/14/2021   Chalazion of left lower eyelid 04/15/2022   Chest pain 02/11/2015  Chronic atrial fibrillation 09/08/2015   Colon polyp    Compressed cervical disc    Coronary artery disease involving native coronary artery with angina pectoris 03/24/2015   Dorsalgia 09/22/2018   Dysphagia 09/22/2018   Essential hypertension 03/03/2015   Generalized anxiety disorder    GERD without esophagitis 09/22/2018   Hematemesis 12/18/2019   Hepatic steatosis 04/22/2022   History of appendectomy 1963   Hypertensive retinopathy    OU   Irritable bowel syndrome without diarrhea 09/22/2018   Lactic acidosis 12/18/2019   LLQ pain 04/15/2022   Major depressive disorder    Migraine  without status migrainosus, not intractable 09/22/2018   Mixed hyperlipidemia 05/07/2016   Non-ST elevation MI (NSTEMI) 07/26/2008   No cardiac catheterization at the time.   Obstructive sleep apnea    no CPAP use   Orthostasis    Osteoarthritis of carpometacarpal (CMC) joint of thumb 04/20/2022   Osteoarthritis of knee 09/22/2018   PAF (paroxysmal atrial fibrillation) 10/09/2018   Pain in joint of right hip 07/03/2020   Pain in right knee 12/14/2018   Recurrent syncope    Rib pain 07/03/2020   Sciatica of right side 09/22/2018   Severe alcohol use disorder    Vitamin D deficiency     Past Surgical History:  Procedure Laterality Date   APPENDECTOMY     CARDIAC CATHETERIZATION N/A 03/25/2015   Procedure: Left Heart Cath and Coronary Angiography;  Surgeon: Marykay Lex, MD;  Location: [facility] INVASIVE CV LAB;  Service: Cardiovascular;  Laterality: N/A;   CORONARY STENT INTERVENTION N/A 10/09/2018   Procedure: CORONARY STENT INTERVENTION;  Surgeon: Corky Crafts, MD;  Location: [facility] INVASIVE CV LAB;  Service: Cardiovascular;  Laterality: N/A;   DILATATION & CURETTAGE/HYSTEROSCOPY WITH MYOSURE N/A 05/19/2016   Procedure: DILATATION & CURETTAGE/HYSTEROSCOPY WITH MYOSURE;  Surgeon: Geryl Rankins, MD;  Location: [facility]S;  Service: Gynecology;  Laterality: N/A;  Possible Myosure for polyps.   EYE SURGERY     LEFT HEART CATH AND CORONARY ANGIOGRAPHY N/A 10/09/2018   Procedure: LEFT HEART CATH AND CORONARY ANGIOGRAPHY;  Surgeon: Corky Crafts, MD;  Location: [facility] INVASIVE CV LAB;  Service: Cardiovascular;  Laterality: N/A;   MOUTH SURGERY     URETHRAL DILATION      FAMHx:  Family History  Problem Relation Age of Onset   Vascular Disease Mother    Dementia Mother    Hypertension Mother    Stroke Mother    Depression Mother    Dementia Father    Heart attack Father    Stroke Father    Heart attack Maternal Grandmother    Heart attack Maternal Grandfather    Kidney failure  Paternal Grandmother    Heart attack Paternal Grandfather     SOCHx:   reports that she has never smoked. She has never used smokeless tobacco. She reports current alcohol use. She reports that she does not use drugs.  ALLERGIES:  Allergies  Allergen Reactions   Sulfa Antibiotics Swelling and Rash   Ciprofloxacin Nausea And Vomiting   Depakote [Divalproex Sodium] Other (See Comments)    Reaction not recalled- "it was 20 years ago"   Pine Itching   Risperidone Other (See Comments)    Unknown reaction   Seroquel [Quetiapine Fumarate] Other (See Comments)    VIVID nightmares   Statins Other (See Comments)    joint pain, weakness   Tramadol Other (See Comments)    Serotonin syndrome   Citalopram Rash and Other (See Comments)  Insomnia and agitation also   Lamotrigine Itching, Rash and Other (See Comments)    Stevens-Johnson syndrome and Brown urine, also   Other Other (See Comments) and Cough    Cat dander   Topiramate Rash and Other (See Comments)    Nightmares, brown urine also   Wasp Venom Dermatitis    Edema and redness in area of wasp sting   Zetia [Ezetimibe]     Joint pain, weakness     ROS: Pertinent items noted in HPI and remainder of comprehensive ROS otherwise negative.  HOME MEDS: Current Outpatient Medications on File Prior to Visit  Medication Sig Dispense Refill   acetaminophen (TYLENOL) 325 MG tablet Take 2 tablets (650 mg total) by mouth every 6 (six) hours as needed for mild pain (or Fever >/= 101).     amLODipine (NORVASC) 2.5 MG tablet Take 1 tablet (2.5 mg total) by mouth daily. 90 tablet 0   azelastine (OPTIVAR) 0.05 % ophthalmic solution INSTILL TWO DROPS IN [facility] EYE TWICE DAILY 6 mL 5   B Complex-C (SUPER B COMPLEX PO) Take by mouth.     BIOTIN PO Take 1 tablet by mouth daily.     buPROPion ER (WELLBUTRIN SR) 100 MG 12 hr tablet Take 1 tablet (100 mg total) by mouth 2 (two) times daily. 60 tablet 1   Cholecalciferol 20 MCG (800 UNIT) TABS Take 1  tablet by mouth daily. 75 tablet 0   clopidogrel (PLAVIX) 75 MG tablet Take 1 tablet (75 mg total) by mouth daily. 90 tablet 3   Cyanocobalamin (B-12 PO) Take 1 tablet by mouth daily.     cyclobenzaprine (FLEXERIL) 5 MG tablet TAKE ONE TABLET BY MOUTH AT BEDTIME 30 tablet 0   diclofenac Sodium (VOLTAREN) 1 % GEL Apply topically as needed.     esomeprazole (NEXIUM 24HR CLEAR MINIS) 20 MG capsule Take 1 capsule (20 mg total) by mouth daily before breakfast. 30 capsule 1   Evolocumab (REPATHA SURECLICK) 140 MG/ML SOAJ Inject 140 mg into the skin every 14 (fourteen) days. 2 mL 5   gabapentin (NEURONTIN) 100 MG capsule Take 1 capsule (100 mg total) by mouth 2 (two) times daily. 60 capsule 1   HYDROcodone-acetaminophen (NORCO/VICODIN) 5-325 MG tablet Take 2 tablets by mouth as needed for moderate pain.     losartan (COZAAR) 50 MG tablet Take 1 tablet (50 mg total) by mouth daily. 90 tablet 2   metoprolol tartrate (LOPRESSOR) 25 MG tablet TAKE ONE-HALF TABLET BY MOUTH TWICE A DAY 90 tablet 3   nitroGLYCERIN (NITROSTAT) 0.4 MG SL tablet Place 1 tablet (0.4 mg total) under the tongue as needed for chest pain. 25 tablet 5   Omega-3 Fatty Acids (OMEGA 3 PO) Take 1 capsule by mouth daily.     Plant Sterols and Stanols (CHOLESTOFF PLUS PO) Take by mouth daily.     Probiotic Product (PROBIOTIC ADVANCED PO) Take by mouth daily.     No current facility-administered medications on file prior to visit.    LABS/IMAGING: No results found for this or any previous visit (from the past 48 hours).  No results found.  LIPID PANEL:    Component Value Date/Time   CHOL 189 10/14/2023 1451   TRIG 97 10/14/2023 1451   HDL 66 10/14/2023 1451   CHOLHDL 2.9 10/14/2023 1451   CHOLHDL 2.8 10/27/2022 1402   VLDL 44 (H) 04/02/2016 1554   LDLCALC 106 (H) 10/14/2023 1451   LDLCALC 116 (H) 10/27/2022 1402    WEIGHTS:  Wt Readings from Last 3 Encounters:  10/18/23 175 lb 3.2 oz (79.5 kg)  07/18/23 173 lb 14.4 oz (78.9  kg)  02/02/23 168 lb 12 oz (76.5 kg)    VITALS: BP 128/88   Pulse 81   Ht 5\' 2"  (1.575 m)   Wt 175 lb 3.2 oz (79.5 kg)   SpO2 98%   BMI 32.04 kg/m   EXAM: Deferred  EKG: Deferred  ASSESSMENT: Probable familial hyperlipidemia Coronary artery disease with prior PCI History of myocardial infarction Statin and ezetimibe intolerance-myalgias  PLAN: 1.   Lindsay [name] has now achieved an LDL of 106 which is substantially improved from 220 a number of years ago.  She is on Repatha.  Unfortunately she had intolerance to a number of other therapies.  Options for additional lipid-lowering are limited and might include Nexletol but at this time I would continue to work with diet and lifestyle modification.  She understands other things she can do to lower her cholesterol more.  Follow-up with repeat lipids in a year with Marcelino Duster or with Dr. Duke Salvia and I am happy to see her back as needed if they are willing to prescribe her Repatha.  Chrystie Nose, MD, [facility], FACP  [place]  [facility]  Medical Director of the [facility] Lipid Disorders &  Cardiovascular Risk Reduction Clinic Diplomate of the American Board of Clinical Lipidology Attending Cardiologist  Direct Dial: [phone]  Fax: [phone]  Website:  [email or web address] Vala [email or web address] 10/18/2023, 4:08 PM

## 2023-10-18 NOTE — Progress Notes (Signed)
 THERAPIST PROGRESS NOTE  Session Time: 11:05 AM to 11:55 PM  Participation Level: Active  Behavioral Response: CasualAlertEuthymic  Type of Therapy: Individual Therapy  Treatment Goals addressed: work on coping skills for anxiety depression to decrease symptoms, work on mood management, mood stabilization, coping in general, patient is collaterally managing substance use issues through AA, coping, address significant stressor of state of her house and home repair is really necessary   ProgressTowards Goals: Progressing-assess helpful for patient to process emotions around her own stressors around current state of affairs in country helpful and coping helpful for looking for helpful problem solving solutions to stressors  Interventions: Solution Focused, Strength-based, Supportive, and Other: coping  Summary: Lindsay [name] is a 68 y.o. female who presents with has partial hiatal hernia.  Has led to ongoing gastrointestinal issues sick last week why she did not come.  Therapist notes bright affect and patient says she is in upswing of mood which means motivated cleaned the kitchen four hours of sleep last night. Last night applied for job apply for temporary job at [employer] hit key and vanished. Going back to [employer] and apply has a lot to do revised the resume. Vomiting blood was terrible last week. Has a friend found out stage 4 anal cancer going to colon.  Taking notes of who is doing what in terms of current state of affairs. Taking names. Talked about what can do patient said call representatives.  Patient should not presented therapy that therapist things very plausible hired bozos depending on Trump a revolution doing what [place] doing. Meant as distraction. Think about death terrified about Musk. Talked about a guy expert at making decisions good health took life early to mid 63's made a decision to go early when in shape no regrets.  Therapist expressed concerns patient Lindsay [name] stop  explored see below Afraid of toilet bowl afraid of going through the floor want to outlive cats. 3 hours chatting with Lindsay [name]. Rare for them to converse told her about toilet bowl floor going and sub floor goes a lot of money.  Will have people repairing her back porch come give her quote patient asked if she pay for it therapist glad she spoke she needs the help. Patient said if not fixed would be living like a pioneer with a bucket.  Shared how in her life when she is in a pet goal stuff happens worked at [employer] and has a check needs to obtain has to send them in response to get it.   Explored where patient is where spirituality patient says her belief brain not constructed to know it. Story making creatures. Something in life, thing in history of chemicals come together by accident thing of planets, galaxies so big may be the only planet that does it. Universe expanding. Edge of universe i infrared. Black hole sucking energy patient shared her theory pushing energy someone worm hole. So much matter gravity hard boom like the first time start another universe. One universe to another.  Reviewed business plants needs to get organize. Great visual memories go back years.  Therapist likes different topics big picture able to talk about purpose and meetings helpful in terms of coping with life  Noted patient more energy patient recognizes on the upswing in mood the positive that getting things done can be an issue so using that constructively.  Patient brought up article she read about a good problem solving person who came to decision to end their life and  this was concerning to therapist we explored and patient said this is something she has been think about for decades loves her life nothing in the near future this would be 10/15 years from now.  Talked about different ways she enjoys life simple things recognizes beauty.  Session covered various topics very interesting subjects about things like the  universe identifying something bigger than [place] spirituality based on how things are put together, concerned about present state of things talking about it assess helpful for coping as well as thinking about ways to put anxiety into active steps so constructive therapist glad to hear there may be some relief from one of her main stressors toilet bowl going through roof.  Helpful to talk about current state of affairs help and release and relief of emotion.  Suicidal/Homicidal: No  Plan: Return again in 1 week.2.Reviewed strategies for hope process thoughts and feelings in session help cope with stressors continue to review history that helps with her mental health symptoms as well   Diagnosis: Mood disorder in conditions classified elsewhere, Generalized  anxiety disorder, severe alcohol use  disorder  Collaboration of Care: Other none needed  Patient/Guardian was advised Release of Information must be obtained prior to any record release in order to collaborate their care with an outside provider. Patient/Guardian was advised if they have not already done so to contact the registration department to sign all necessary forms in order for [place] to release information regarding their care.   Consent: Patient/Guardian gives verbal consent for treatment and assignment of benefits for services provided during this visit. Patient/Guardian expressed understanding and agreed to proceed.   Coolidge Breeze, LCSW 10/18/2023

## 2023-10-21 ENCOUNTER — Ambulatory Visit (INDEPENDENT_AMBULATORY_CARE_PROVIDER_SITE_OTHER): Admitting: Physician Assistant

## 2023-10-21 VITALS — BP 130/70

## 2023-10-21 DIAGNOSIS — E559 Vitamin D deficiency, unspecified: Secondary | ICD-10-CM | POA: Diagnosis not present

## 2023-10-21 DIAGNOSIS — K219 Gastro-esophageal reflux disease without esophagitis: Secondary | ICD-10-CM | POA: Diagnosis not present

## 2023-10-21 DIAGNOSIS — J302 Other seasonal allergic rhinitis: Secondary | ICD-10-CM | POA: Diagnosis not present

## 2023-10-21 DIAGNOSIS — R1013 Epigastric pain: Secondary | ICD-10-CM | POA: Insufficient documentation

## 2023-10-21 DIAGNOSIS — J301 Allergic rhinitis due to pollen: Secondary | ICD-10-CM | POA: Diagnosis not present

## 2023-10-21 DIAGNOSIS — E538 Deficiency of other specified B group vitamins: Secondary | ICD-10-CM

## 2023-10-21 MED ORDER — ESOMEPRAZOLE MAGNESIUM 40 MG PO CPDR
DELAYED_RELEASE_CAPSULE | ORAL | 1 refills | Status: DC
Start: 2023-10-21 — End: 2024-02-01

## 2023-10-21 MED ORDER — FLUTICASONE PROPIONATE 50 MCG/ACT NA SUSP
2.0000 | Freq: Every day | NASAL | 0 refills | Status: DC
Start: 2023-10-21 — End: 2024-02-01

## 2023-10-21 MED ORDER — CYANOCOBALAMIN 1000 MCG/ML IJ SOLN
1000.0000 ug | Freq: Once | INTRAMUSCULAR | Status: AC
Start: 2023-10-21 — End: 2023-10-21
  Administered 2023-10-21: 1000 ug via INTRAMUSCULAR

## 2023-10-21 NOTE — Patient Instructions (Addendum)
 Will make referral to GI B12 IM Flonase and consider allegra/clartin/zyrtec daily during allergy season Get labs today Start back on nexium daily until you see GI.   Gastritis, Adult Gastritis is inflammation of the stomach. There are two kinds of gastritis: Acute gastritis. This kind develops suddenly. Chronic gastritis. This kind is much more common. It develops slowly and lasts for a long time. Gastritis happens when the lining of the stomach becomes weak or gets damaged. Without treatment, gastritis can lead to stomach bleeding and ulcers. What are the causes? This condition may be caused by: An infection. Drinking too much alcohol. Certain medicines. These include steroids, antibiotics, and some over-the-counter medicines, such as aspirin or ibuprofen. Having too much acid in the stomach. Having a disease of the stomach. Other causes may include: An allergic reaction. Some cancer treatments (radiation). Smoking cigarettes or the use of products that contain nicotine or tobacco. In some cases, the cause of this condition is not known. What increases the risk? Having a disease of the intestines. Having a disease in which the body's immune system attacks the body (autoimmune disease), such as Crohn's disease. Using aspirin or ibuprofen and other NSAIDs to treat other conditions, such as heart disease or chronic pain. Stress. What are the signs or symptoms? Symptoms of this condition include: Pain or a burning sensation in the upper abdomen. Nausea. Vomiting. An uncomfortable feeling of fullness after eating. Weight loss. Bad breath. Blood in your vomit or stool (feces). In some cases, there are no symptoms. How is this diagnosed? This condition may be diagnosed based on your medical history, a physical exam, and tests. Tests may include: Your medical history and a description of your symptoms. A physical exam. Tests. These can include: Blood tests. Stool  tests. A test in which a thin, flexible instrument with a light and a camera is passed down the esophagus and into the stomach (upper endoscopy). A test in which a tissue sample is removed to look at it under a microscope (biopsy). How is this treated? This condition may be treated with medicines. The medicines that are used vary depending on the cause of the gastritis. If the condition is caused by a bacterial infection, you may be given antibiotic medicines. If the condition is caused by too much acid in the stomach, you may be given medicines called H2 blockers, proton pump inhibitors, or antacids. Treatment may also involve stopping the use of certain medicines such as aspirin or ibuprofen and other NSAIDs. Follow these instructions at home: Medicines Take over-the-counter and prescription medicines only as told by your health care provider. If you were prescribed an antibiotic medicine, take it as told by your health care provider. Do not stop taking the antibiotic even if you start to feel better. Alcohol use Do not drink alcohol if: Your health care provider tells you not to drink. You are pregnant, may be pregnant, or are planning to become pregnant. If you drink alcohol: Limit your use to: 0-1 drink a day for women. 0-2 drinks a day for men. Know how much alcohol is in your drink. In the [place] one drink equals one 12 oz bottle of beer (355 mL), one 5 oz glass of wine (148 mL), or one 1 oz glass of hard liquor (44 mL). General instructions  Eat small, frequent meals instead of large meals. Avoid foods and drinks that make your symptoms worse. Talk with your health care provider about ways to manage stress, such as getting regular  exercise or practicing deep breathing, meditation, or yoga. Do not use any products that contain nicotine or tobacco. These products include cigarettes, chewing tobacco, and vaping devices, such as e-cigarettes. If you need help quitting, ask your health  care provider. Drink enough fluid to keep your urine pale yellow. Keep all follow-up visits. This is important. Contact a health care provider if: Your symptoms get worse. Your abdominal pain gets worse. Your symptoms return after treatment. You have a fever. Get help right away if: You vomit blood or a substance that looks like coffee grounds. You have black or dark red stools. You are unable to keep fluids down. These symptoms may represent a serious problem that is an emergency. Do not wait to see if the symptoms will go away. Get medical help right away. Call your local emergency services (911 in the [place] Do not drive yourself to the hospital. Summary Gastritis is inflammation of the lining of the stomach that can occur suddenly (acute) or develop slowly over time (chronic). This condition is diagnosed with a medical history, a physical exam, or tests. This condition may be treated with medicines to treat infection or medicines to reduce the amount of acid in your stomach. Follow your health care provider's instructions about taking medicines, making changes to your diet, and knowing when to call for help. This information is not intended to replace advice given to you by your health care provider. Make sure you discuss any questions you have with your health care provider. Document Revised: 11/15/2020 Document Reviewed: 11/15/2020 Elsevier Patient Education  2024 [employer].

## 2023-10-22 LAB — CMP14+EGFR
ALT: 20 IU/L (ref 0–32)
AST: 27 IU/L (ref 0–40)
Albumin: 4.4 g/dL (ref 3.9–4.9)
Alkaline Phosphatase: 114 IU/L (ref 44–121)
BUN/Creatinine Ratio: 18 (ref 12–28)
BUN: 14 mg/dL (ref 8–27)
Bilirubin Total: 0.4 mg/dL (ref 0.0–1.2)
CO2: 24 mmol/L (ref 20–29)
Calcium: 10.3 mg/dL (ref 8.7–10.3)
Chloride: 103 mmol/L (ref 96–106)
Creatinine, Ser: 0.76 mg/dL (ref 0.57–1.00)
Globulin, Total: 2.3 g/dL (ref 1.5–4.5)
Glucose: 109 mg/dL — ABNORMAL HIGH (ref 70–99)
Potassium: 4.5 mmol/L (ref 3.5–5.2)
Sodium: 142 mmol/L (ref 134–144)
Total Protein: 6.7 g/dL (ref 6.0–8.5)
eGFR: 84 mL/min/{1.73_m2} (ref >59)

## 2023-10-22 LAB — CBC WITH DIFFERENTIAL/PLATELET
Basophils Absolute: 0 10*3/uL (ref 0.0–0.2)
Basos: 0 %
EOS (ABSOLUTE): 0 10*3/uL (ref 0.0–0.4)
Eos: 0 %
Hematocrit: 42.1 % (ref 34.0–46.6)
Hemoglobin: 14.4 g/dL (ref 11.1–15.9)
Immature Grans (Abs): 0 10*3/uL (ref 0.0–0.1)
Immature Granulocytes: 0 %
Lymphocytes Absolute: 1.4 10*3/uL (ref 0.7–3.1)
Lymphs: 23 %
MCH: 32.4 pg (ref 26.6–33.0)
MCHC: 34.2 g/dL (ref 31.5–35.7)
MCV: 95 fL (ref 79–97)
Monocytes Absolute: 0.8 10*3/uL (ref 0.1–0.9)
Monocytes: 14 %
Neutrophils Absolute: 3.7 10*3/uL (ref 1.4–7.0)
Neutrophils: 63 %
Platelets: 273 10*3/uL (ref 150–450)
RBC: 4.44 x10E6/uL (ref 3.77–5.28)
RDW: 12.5 % (ref 11.7–15.4)
WBC: 5.9 10*3/uL (ref 3.4–10.8)

## 2023-10-22 LAB — IRON,TIBC AND FERRITIN PANEL
Ferritin: 233 ng/mL — ABNORMAL HIGH (ref 15–150)
Iron Saturation: 51 % (ref 15–55)
Iron: 137 ug/dL (ref 27–139)
Total Iron Binding Capacity: 269 ug/dL (ref 250–450)
UIBC: 132 ug/dL (ref 118–369)

## 2023-10-22 LAB — VITAMIN D 25 HYDROXY (VIT D DEFICIENCY, FRACTURES): Vit D, 25-Hydroxy: 32.1 ng/mL (ref 30.0–100.0)

## 2023-10-22 LAB — B12 AND FOLATE PANEL
Folate: 9 ng/mL (ref >3.0)
Vitamin B-12: >2000 pg/mL — ABNORMAL HIGH (ref 232–1245)

## 2023-10-22 LAB — H. PYLORI BREATH TEST: H pylori Breath Test: NEGATIVE

## 2023-10-24 ENCOUNTER — Encounter: Payer: Self-pay | Admitting: Physician Assistant

## 2023-10-24 DIAGNOSIS — E538 Deficiency of other specified B group vitamins: Secondary | ICD-10-CM | POA: Insufficient documentation

## 2023-10-24 NOTE — Progress Notes (Signed)
 Established Patient Office Visit  Subjective   Patient ID: Lindsay [name], female    DOB: 1956-05-24  Age: 68 y.o. MRN: [id number]  Chief Complaint  Patient presents with   Abdominal Pain    HPI Pt is a 68 yo female with hx of GERD and was on nexium presents to the clinic with epigastric pain and reflux symptoms. She did well on nexium but did not want to take it because of the effects on bone density. She has been taking OTC baking soda for symptoms but not helping enough. She denies any black tarry or bright red stools. She has had years of reflux.    Pt would like to get regular B12 shots like she used to. She is willing to give them to herself. She felt better on them.   She is having nasal congestion and rhinorrhea. She request a nasal spray.   .. Active Ambulatory Problems    Diagnosis Date Noted   Essential hypertension 03/03/2015   Coronary artery disease involving native coronary artery with angina pectoris 03/24/2015   Non-ST elevation MI (NSTEMI) (HCC) 07/26/2008   Mixed hyperlipidemia 05/07/2016   Dorsalgia 09/22/2018   Chest pain 02/11/2015   Chronic atrial fibrillation (HCC) 09/08/2015   Dysphagia 09/22/2018   GERD without esophagitis 09/22/2018   Irritable bowel syndrome without diarrhea 09/22/2018   Osteoarthritis of knee 09/22/2018   Polypharmacy 09/22/2018   Sciatica of right side 09/22/2018   Vitamin D deficiency 09/22/2018   PAF (paroxysmal atrial fibrillation) 10/09/2018   Lactic acidosis 12/18/2019   Hematemesis 12/18/2019   Recurrent syncope    Orthostasis    Bipolar 1 disorder (HCC)    Pain in joint of right hip 07/03/2020   Pain in right knee 12/14/2018   Cervical radiculopathy 10/14/2021   Rib pain 07/03/2020   Chalazion of left lower eyelid 04/15/2022   Hepatic steatosis 04/22/2022   Osteoarthritis of carpometacarpal (CMC) joint of thumb 04/20/2022   Acquired trigger finger of right ring finger 04/20/2022   Bilateral wrist pain 04/20/2022    Major depressive disorder    Severe alcohol use disorder    Generalized anxiety disorder    Obstructive sleep apnea    Lumbar radiculopathy 05/26/2022   Degeneration of lumbar intervertebral disc 05/26/2022   Neck pain 10/27/2022   Acute thoracic back pain 10/27/2022   Blood clotting disorder (HCC) 10/28/2022   Insomnia 12/23/2022   Stress 02/02/2023   Seasonal allergies 10/21/2023   Gastroesophageal reflux disease without esophagitis 10/21/2023   Epigastric pain 10/21/2023   B12 deficiency 10/24/2023   Resolved Ambulatory Problems    Diagnosis Date Noted   Unstable angina (HCC) 03/25/2015   Alcohol intoxication (HCC) 09/08/2015   Nausea with vomiting 02/11/2015   Menopausal and perimenopausal disorder 09/22/2018   Migraine without status migrainosus, not intractable 09/22/2018   Obesity 09/22/2018   Pain in joint 09/22/2018   Right upper quadrant pain 09/22/2018   Immunization due 04/15/2022   LLQ pain 04/15/2022   Encounter to discuss test results 04/22/2022   Viral upper respiratory tract infection 02/02/2023   Past Medical History:  Diagnosis Date   Abnormal mammogram    Arthritis    Cataract    Colon polyp    Compressed cervical disc    History of appendectomy 1963   Hypertensive retinopathy      ROS See HPI.    Objective:     BP 130/70  BP Readings from Last 3 Encounters:  10/21/23 130/70  10/18/23 128/88  07/18/23 108/60   Wt Readings from Last 3 Encounters:  10/18/23 175 lb 3.2 oz (79.5 kg)  07/18/23 173 lb 14.4 oz (78.9 kg)  02/02/23 168 lb 12 oz (76.5 kg)      Physical Exam Constitutional:      Appearance: She is well-developed.  HENT:     Head: Normocephalic.     Mouth/Throat:     Mouth: Mucous membranes are moist.  Cardiovascular:     Rate and Rhythm: Normal rate and regular rhythm.  Pulmonary:     Effort: Pulmonary effort is normal.  Abdominal:     General: Bowel sounds are normal. There is no distension.     Palpations: Abdomen  is soft.     Tenderness: There is abdominal tenderness in the epigastric area. There is no right CVA tenderness, left CVA tenderness, guarding or rebound. Negative signs include McBurney's sign.  Neurological:     General: No focal deficit present.     Mental Status: She is alert.  Psychiatric:        Mood and Affect: Mood normal.        Assessment & Plan:  Marland KitchenMarland KitchenDavetta was seen today for abdominal pain.  Diagnoses and all orders for this visit:  Epigastric pain -     H. pylori breath test -     CMP14+EGFR -     B12 and Folate Panel -     VITAMIN D 25 Hydroxy (Vit-D Deficiency, Fractures) -     CBC w/Diff/Platelet -     Fe+TIBC+Fer -     esomeprazole (NEXIUM) 40 MG capsule; One tab by mouth at dinner time. -     Ambulatory referral to Gastroenterology  Gastroesophageal reflux disease without esophagitis -     H. pylori breath test -     CMP14+EGFR -     B12 and Folate Panel -     VITAMIN D 25 Hydroxy (Vit-D Deficiency, Fractures) -     CBC w/Diff/Platelet -     Fe+TIBC+Fer -     esomeprazole (NEXIUM) 40 MG capsule; One tab by mouth at dinner time. -     Ambulatory referral to Gastroenterology  Seasonal allergies -     fluticasone (FLONASE) 50 MCG/ACT nasal spray; Place 2 sprays into both nostrils daily.  Vitamin D deficiency -     VITAMIN D 25 Hydroxy (Vit-D Deficiency, Fractures)  B12 deficiency -     cyanocobalamin (VITAMIN B12) injection 1,000 mcg  Seasonal allergic rhinitis due to pollen -     fluticasone (FLONASE) 50 MCG/ACT nasal spray; Place 2 sprays into both nostrils daily.   B12 shot given today in office Check B12 in labs  Flonase to start for allergic rhinitis consider daily zyrtec/claritin/allegra during the pollen season  Suspect gastritis due to not being on PPI  Cbc to be checked today for any signs of acute blood loss Need endoscopy Will get h.pylori testing Restart nexium until she see GI If symptoms worsen please follow up   Tandy Gaw,  PA-C

## 2023-10-24 NOTE — Progress Notes (Signed)
 H.pylori breat test negative, great news.  Hemoglobin looks great and serum iron normal.  B12 too high so no indication for home b12 shots.  Kidney, liver look great.

## 2023-10-25 ENCOUNTER — Ambulatory Visit: Attending: Physician Assistant

## 2023-10-25 ENCOUNTER — Ambulatory Visit (INDEPENDENT_AMBULATORY_CARE_PROVIDER_SITE_OTHER): Payer: Medicare Other | Admitting: Licensed Clinical Social Worker

## 2023-10-25 DIAGNOSIS — F411 Generalized anxiety disorder: Secondary | ICD-10-CM

## 2023-10-25 DIAGNOSIS — M6281 Muscle weakness (generalized): Secondary | ICD-10-CM

## 2023-10-25 DIAGNOSIS — M25561 Pain in right knee: Secondary | ICD-10-CM | POA: Insufficient documentation

## 2023-10-25 DIAGNOSIS — M25562 Pain in left knee: Secondary | ICD-10-CM | POA: Insufficient documentation

## 2023-10-25 DIAGNOSIS — F1020 Alcohol dependence, uncomplicated: Secondary | ICD-10-CM

## 2023-10-25 DIAGNOSIS — G8929 Other chronic pain: Secondary | ICD-10-CM | POA: Diagnosis not present

## 2023-10-25 DIAGNOSIS — F0630 Mood disorder due to known physiological condition, unspecified: Secondary | ICD-10-CM

## 2023-10-25 DIAGNOSIS — R262 Difficulty in walking, not elsewhere classified: Secondary | ICD-10-CM

## 2023-10-25 NOTE — Therapy (Signed)
 OUTPATIENT PHYSICAL THERAPY LOWER EXTREMITY TREATMENT   Patient Name: Lindsay [name] MRN: [id number] DOB:[birth year], 68 y.o., female Today's Date: 10/25/2023  END OF SESSION:  PT End of Session - 10/25/23 1101     Visit Number 6    Number of Visits 13    Date for PT Re-Evaluation 10/25/23    Authorization Type Medicare    Progress Note Due on Visit 10    PT Start Time 1105    PT Stop Time 1147    PT Time Calculation (min) 42 min    Activity Tolerance Patient tolerated treatment well    Behavior During Therapy [facility] for tasks assessed/performed            Past Medical History:  Diagnosis Date   Abnormal mammogram    Thick Tissue   Acquired trigger finger of right ring finger 04/20/2022   Arthritis    Bilateral wrist pain 04/20/2022   Bipolar 1 disorder    Cataract    OU   Cervical radiculopathy 10/14/2021   Chalazion of left lower eyelid 04/15/2022   Chest pain 02/11/2015   Chronic atrial fibrillation 09/08/2015   Colon polyp    Compressed cervical disc    Coronary artery disease involving native coronary artery with angina pectoris 03/24/2015   Dorsalgia 09/22/2018   Dysphagia 09/22/2018   Essential hypertension 03/03/2015   Generalized anxiety disorder    GERD without esophagitis 09/22/2018   Hematemesis 12/18/2019   Hepatic steatosis 04/22/2022   History of appendectomy 1963   Hypertensive retinopathy    OU   Irritable bowel syndrome without diarrhea 09/22/2018   Lactic acidosis 12/18/2019   LLQ pain 04/15/2022   Major depressive disorder    Migraine without status migrainosus, not intractable 09/22/2018   Mixed hyperlipidemia 05/07/2016   Non-ST elevation MI (NSTEMI) 07/26/2008   No cardiac catheterization at the time.   Obstructive sleep apnea    no CPAP use   Orthostasis    Osteoarthritis of carpometacarpal (CMC) joint of thumb 04/20/2022   Osteoarthritis of knee 09/22/2018   PAF (paroxysmal atrial fibrillation) 10/09/2018   Pain in joint of right hip  07/03/2020   Pain in right knee 12/14/2018   Recurrent syncope    Rib pain 07/03/2020   Sciatica of right side 09/22/2018   Severe alcohol use disorder    Vitamin D deficiency    Past Surgical History:  Procedure Laterality Date   APPENDECTOMY     CARDIAC CATHETERIZATION N/A 03/25/2015   Procedure: Left Heart Cath and Coronary Angiography;  Surgeon: Marykay Lex, MD;  Location: [facility] INVASIVE CV LAB;  Service: Cardiovascular;  Laterality: N/A;   CORONARY STENT INTERVENTION N/A 10/09/2018   Procedure: CORONARY STENT INTERVENTION;  Surgeon: Corky Crafts, MD;  Location: [facility] INVASIVE CV LAB;  Service: Cardiovascular;  Laterality: N/A;   DILATATION & CURETTAGE/HYSTEROSCOPY WITH MYOSURE N/A 05/19/2016   Procedure: DILATATION & CURETTAGE/HYSTEROSCOPY WITH MYOSURE;  Surgeon: Geryl Rankins, MD;  Location: [facility];  Service: Gynecology;  Laterality: N/A;  Possible Myosure for polyps.   EYE SURGERY     LEFT HEART CATH AND CORONARY ANGIOGRAPHY N/A 10/09/2018   Procedure: LEFT HEART CATH AND CORONARY ANGIOGRAPHY;  Surgeon: Corky Crafts, MD;  Location: [facility] INVASIVE CV LAB;  Service: Cardiovascular;  Laterality: N/A;   MOUTH SURGERY     URETHRAL DILATION     Patient Active Problem List   Diagnosis Date Noted   B12 deficiency 10/24/2023   Seasonal allergies 10/21/2023   Gastroesophageal  reflux disease without esophagitis 10/21/2023   Epigastric pain 10/21/2023   Stress 02/02/2023   Insomnia 12/23/2022   Blood clotting disorder (HCC) 10/28/2022   Neck pain 10/27/2022   Acute thoracic back pain 10/27/2022   Lumbar radiculopathy 05/26/2022   Degeneration of lumbar intervertebral disc 05/26/2022   Major depressive disorder    Severe alcohol use disorder    Generalized anxiety disorder    Obstructive sleep apnea    Hepatic steatosis 04/22/2022   Osteoarthritis of carpometacarpal (CMC) joint of thumb 04/20/2022   Acquired trigger finger of right ring finger 04/20/2022   Bilateral  wrist pain 04/20/2022   Chalazion of left lower eyelid 04/15/2022   Bipolar 1 disorder (HCC)    Cervical radiculopathy 10/14/2021   Pain in joint of right hip 07/03/2020   Rib pain 07/03/2020   Recurrent syncope    Orthostasis    Lactic acidosis 12/18/2019   Hematemesis 12/18/2019   Pain in right knee 12/14/2018   PAF (paroxysmal atrial fibrillation) 10/09/2018   Dorsalgia 09/22/2018   Dysphagia 09/22/2018   GERD without esophagitis 09/22/2018   Irritable bowel syndrome without diarrhea 09/22/2018   Osteoarthritis of knee 09/22/2018   Polypharmacy 09/22/2018   Sciatica of right side 09/22/2018   Vitamin D deficiency 09/22/2018   Mixed hyperlipidemia 05/07/2016   Chronic atrial fibrillation (HCC) 09/08/2015   Coronary artery disease involving native coronary artery with angina pectoris 03/24/2015   Essential hypertension 03/03/2015   Chest pain 02/11/2015   Non-ST elevation MI (NSTEMI) (HCC) 07/26/2008    PCP: Chilton Si, MD  REFERRING PROVIDER: Elvin So, PA-C  REFERRING DIAG: [phone] (ICD-10-CM) - Pain in right knee  THERAPY DIAG:  Chronic pain of both knees  Difficulty in walking, not elsewhere classified  Muscle weakness (generalized)  Rationale for Evaluation and Treatment: Rehabilitation  ONSET DATE: 3 years ago  SUBJECTIVE:   SUBJECTIVE STATEMENT: Patient reports she had a fall since last PT visit, states the tendons behind her L knee are still very sore and tender. Patient states since the fall the pain is slightly worse on L as compared to R.   Pt reports ~3 year history of BIL knee pain, R>L. Does not endorse any specific injury, reports this was preceded by job with more time on feet. States symptoms slowly seem to be worsening, knee will occasionally buckle. Tends to fluctuate, oftentimes based on work activities. Has fluctuating swelling as well through lateral R knee. She also endorses history of neck/UE pain, cramping in legs and arms  for which she states she is in communication w/ providers. Also mentions a R shoulder subluxation a couple months ago, states she will be getting an MRI soon. Also reports seeing counselor weekly for mental health hx.   PERTINENT HISTORY: BPD1, afib, CAD w/ chest pain, dysphagia, HTN, GAD, depression, migraine, NSTEMI, OSA, recurrent syncope  PAIN:  Are you having pain: 010 Location/description: R>L knee, posterior and anterior Best-worst over past week: 2-7/10  - aggravating factors: walking, standing ,stairs - Easing factors: pain ointment, pain medication  PRECAUTIONS: cardiac hx     WEIGHT BEARING RESTRICTIONS: No  FALLS:  Has patient fallen in last 6 months? No  LIVING ENVIRONMENT: Uses cane PRN for balance Lives w/ cats; lives in split level, 2 sets of stairs - BIL rails, 6-8 vs 8-10 steps on each  OCCUPATION: cashier  PLOF: Independent - enjoys gardening, art  PATIENT GOALS: build strength/muscle, get back to art  NEXT MD VISIT: TBD  OBJECTIVE:  Note: Objective measures were completed at Evaluation unless otherwise noted.  DIAGNOSTIC FINDINGS:  No recent imaging in chart - pt states she has had XR, endorses some degenerative changes   PATIENT SURVEYS:   09/20/23 LEFS 27 / 80 = 33.8 %  COGNITION: Overall cognitive status: Within functional limits for tasks assessed     SENSATION: NT  EDEMA:  Pt endorses fluctuating edema in lateral R knee   LOWER EXTREMITY ROM:      Right eval Left eval  Hip flexion    Hip extension    Hip internal rotation    Hip external rotation    Knee extension A: full   A: full  Knee flexion A: 119 deg   A: 124 deg   (Blank rows = not tested) (Key: WFL = within functional limits not formally assessed, * = concordant pain, s = stiffness/stretching sensation, NT = not tested)  Comments:    LOWER EXTREMITY MMT:    MMT Right eval Left eval  Hip flexion 4 4  Hip abduction (modified sitting) 5 5  Hip internal rotation     Hip external rotation    Knee flexion 4+ 5  Knee extension 4+ 5  Ankle dorsiflexion 4+ 5   (Blank rows = not tested) (Key: WFL = within functional limits not formally assessed, * = concordant pain, s = stiffness/stretching sensation, NT = not tested)  Comments:    LOWER EXTREMITY SPECIAL TESTS:  Deferred given time constraints  FUNCTIONAL TESTS:  5xSTS: 11.69sec w/ mild pain  TUG: 10.27sec w/o AD, antalgic gait  GAIT: Distance walked: within clinic Assistive device utilized: None Level of assistance: Complete Independence Comments: antalgic gait R>L, reduced gait speed/cadence                                                                                                                                TREATMENT DATE:  [facility] Adult PT Treatment:                                                DATE: 10/25/2023 Therapeutic Exercise: NuStep L5 x 6 min + subjective intake Supine heel slides with orange PB  Neuromuscular re-ed: SAQ + 3#AW x20 (B) SLR small range for quad activation Therapeutic Activity: Timed STS LEFS   [facility] Adult PT Treatment:                                                DATE: 10/18/23 Therapeutic Exercise: Nustep L5 x 6 min at end of session for increased mobility Seated clamshell x 20 Blue band Seated LAQ B x 20 (add wt next time) Bridge with blue band +clam 2  x 10 Standing knee flexion x 20 B Standing heel raises x 20 Neuromuscular re-ed: SLR B x 15 Supine SAQ 5 sec hold x 20 Standing hip ABD x 10 B Standing hip Ext x 10 B Therapeutic Activity: Sit to stand  8# x 20   [facility] Adult PT Treatment:                                                DATE: 10/13/23 Therapeutic Exercise: Nustep L5 x 7 min at end of session for increased mobility Seated clamshell x 20 Blue band Seated LAQ B x 20 Neuromuscular re-ed: Supine quad set 5 sec hold x 20 - cues to avoid buttocks SLR B x 15 Supine SAQ 5 sec hold x 20   [facility] Adult PT Treatment:                                                 DATE: 09/29/23 Therapeutic Exercise: Nustep L5 x 5.5 min status discussed Seated clamshell x 20 Blue band Bridge 10 sec x 5, then with blue band +clam x 10 Seated LAQ B x 20 Prone knee flexion x 10 B Prone knee flexion stretch x 30 sec B  Neuromuscular re-ed: Supine quad set 5 sec hold x 10 - cues to avoid buttocks Prone quad set 5 sec hold x 10 (supine is better) Supine SAQ 5 sec hold x 20 Supine SLR and with ER - unable to do so moved to S/L SLR B x 15 Therapeutic Activity: Sit to stand x 10 with blue band, sit to stand 8# x 15     PATIENT EDUCATION:  Education details: HEP established Person educated: Patient Education method: Explanation, Demonstration, Tactile cues, Verbal cues Education comprehension: verbalized understanding, returned demonstration, verbal cues required, tactile cues required, and needs further education    HOME EXERCISE PROGRAM: Access Code: [email or web address] URL: [email or web address] Date: 10/13/2023 Prepared by: Raynelle Fanning  Exercises - Supine Quadricep Sets  - 1 x daily - 7 x weekly - 2 sets - 15 reps - 5 second hold - Supine Knee Extension Strengthening  - 1 x daily - 7 x weekly - 2 sets - 15 reps - Sidelying Hip Flexion  - 1 x daily - 3 x weekly - 2 sets - 15 reps - Sidelying Hip Abduction  - 1 x daily - 3 x weekly - 2 sets - 15 reps - Squat with Chair Touch  - 1 x daily - 3 x weekly - 1-2 sets - 10 reps - Seated Long Arc Quad  - 1 x daily - 7 x weekly - 22 sets - 15 reps - Supine Bridge  - 1 x daily - 7 x weekly - 1-3 sets - 10 reps - 10 sec hold - Supine Active Straight Leg Raise  - 2 x daily - 7 x weekly - 3 sets - 10 reps - Seated Hip Abduction with Resistance  - 1 x daily - 3 x weekly - 2 sets - 10 reps  ASSESSMENT:  CLINICAL IMPRESSION: Session focused on quad activation and strengthening. Increased bilateral knee pain and discomfort with knee AROM due to fall. Patient met LTG #4 with significant improvement  with timed sit to stand.  Patient will be re-evaluated next visit to determine if extending POC is appropriate.   OBJECTIVE IMPAIRMENTS: Abnormal gait, decreased activity tolerance, decreased balance, decreased endurance, decreased mobility, difficulty walking, decreased ROM, decreased strength, and pain.   ACTIVITY LIMITATIONS: carrying, lifting, bending, standing, squatting, sleeping, stairs, transfers, and locomotion level  PARTICIPATION LIMITATIONS: meal prep, cleaning, laundry, community activity, and occupation  PERSONAL FACTORS: Age, Time since onset of injury/illness/exacerbation, and 3+ comorbidities: BPD1, afib, CAD w/ chest pain, dysphagia, HTN, GAD, depression, migraine, NSTEMI, OSA, recurrent syncope  are also affecting patient's functional outcome.   REHAB POTENTIAL: Good  CLINICAL DECISION MAKING: Evolving/moderate complexity  EVALUATION COMPLEXITY: Moderate   GOALS:  SHORT TERM GOALS: Target date: 10/04/2023 Pt will demonstrate appropriate understanding and performance of initially prescribed HEP in order to facilitate improved independence with management of symptoms.  Baseline: HEP TBD  Goal status: IN PROGRESS  2. Pt will report at least 25% improvement in overall pain levels over past week in order to facilitate improved tolerance to typical daily activities.   Baseline: 2-7/10  Goal status: IN PROGRESS   LONG TERM GOALS: Target date: 10/25/2023 Pt will improve MCID or greater on LEFS in order to demonstrate improved perception of function due to symptoms (MCID 9 pts) Baseline: LEFS TBD; 10/25/23: 29/80 = 36.3% Goal status: INITIAL  2.  Pt will demonstrate 5/5 MMT BIL knees/ankles for improved functional strength.  Baseline: See MMT chart above Goal status: INITIAL  3.  Pt will report no more than 4/10 knee pain on NPS with average work day in order to facilitate improved tolerance to work tasks.  Baseline: increased pain at end of work day, up to 7/10; 10/25/23:  8/10 Goal status: IN PROGRESS  4.  Pt will be able to perform 5xSTS in less than or equal to 9sec in order to demonstrate reduced fall risk and improved functional independence (MCID 5xSTS = 2.3 sec). Baseline: 11sec; 10/25/23: 9 sec Goal status: MET  5. Pt will report at least 50% decrease in overall pain levels in past week in order to facilitate improved tolerance to basic ADLs/mobility.   Baseline: 2-7/10  Goal status: INITIAL    6. Pt will demonstrate appropriate performance of final prescribed HEP in order to facilitate improved self-management of symptoms post-discharge.   Baseline: HEP TBD  Goal status: INITIAL     PLAN:  PT FREQUENCY: 1-2x/week  PT DURATION: 6 weeks  PLANNED INTERVENTIONS: 97164- PT Re-evaluation, 97110-Therapeutic exercises, 97530- Therapeutic activity, 97112- Neuromuscular re-education, 97535- Self Care, 29562- Manual therapy, [id number]- Gait training, Patient/Family education, Balance training, Stair training, Taping, Dry Needling, Joint mobilization, Cryotherapy, and Moist heat  PLAN FOR NEXT SESSION: Re-eval next visit.    Carlynn Herald, PTA 10/25/2023 11:48 AM
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
## 2023-10-27 ENCOUNTER — Encounter: Payer: Self-pay | Admitting: Physical Therapy

## 2023-10-27 ENCOUNTER — Ambulatory Visit: Admitting: Physical Therapy

## 2023-10-27 DIAGNOSIS — M25561 Pain in right knee: Secondary | ICD-10-CM | POA: Diagnosis not present

## 2023-10-27 DIAGNOSIS — M6281 Muscle weakness (generalized): Secondary | ICD-10-CM | POA: Diagnosis not present

## 2023-10-27 DIAGNOSIS — G8929 Other chronic pain: Secondary | ICD-10-CM | POA: Diagnosis not present

## 2023-10-27 DIAGNOSIS — M25562 Pain in left knee: Secondary | ICD-10-CM | POA: Diagnosis not present

## 2023-10-27 DIAGNOSIS — R262 Difficulty in walking, not elsewhere classified: Secondary | ICD-10-CM | POA: Diagnosis not present

## 2023-10-27 NOTE — Therapy (Signed)
 OUTPATIENT PHYSICAL THERAPY LOWER EXTREMITY PROGRESS NOTE AND RE-CERTIFICATION  PROGRESS NOTE  Reporting Period 09/13/23 to 10/27/23   See note below for Objective Data and Assessment of Progress/Goals.   Patient Name: Lindsay [name] MRN: [id number] DOB:03-20-1956, 68 y.o., female Today's Date: 10/27/2023  END OF SESSION:  PT End of Session - 10/27/23 1014     Visit Number 7    Number of Visits 13    Date for PT Re-Evaluation 11/24/23    Authorization Type Medicare    Progress Note Due on Visit 17    PT Start Time 1013    PT Stop Time 1055    PT Time Calculation (min) 42 min    Activity Tolerance Patient tolerated treatment well    Behavior During Therapy [facility] for tasks assessed/performed             Past Medical History:  Diagnosis Date   Abnormal mammogram    Thick Tissue   Acquired trigger finger of right ring finger 04/20/2022   Arthritis    Bilateral wrist pain 04/20/2022   Bipolar 1 disorder    Cataract    OU   Cervical radiculopathy 10/14/2021   Chalazion of left lower eyelid 04/15/2022   Chest pain 02/11/2015   Chronic atrial fibrillation 09/08/2015   Colon polyp    Compressed cervical disc    Coronary artery disease involving native coronary artery with angina pectoris 03/24/2015   Dorsalgia 09/22/2018   Dysphagia 09/22/2018   Essential hypertension 03/03/2015   Generalized anxiety disorder    GERD without esophagitis 09/22/2018   Hematemesis 12/18/2019   Hepatic steatosis 04/22/2022   History of appendectomy 1963   Hypertensive retinopathy    OU   Irritable bowel syndrome without diarrhea 09/22/2018   Lactic acidosis 12/18/2019   LLQ pain 04/15/2022   Major depressive disorder    Migraine without status migrainosus, not intractable 09/22/2018   Mixed hyperlipidemia 05/07/2016   Non-ST elevation MI (NSTEMI) 07/26/2008   No cardiac catheterization at the time.   Obstructive sleep apnea    no CPAP use   Orthostasis    Osteoarthritis of  carpometacarpal (CMC) joint of thumb 04/20/2022   Osteoarthritis of knee 09/22/2018   PAF (paroxysmal atrial fibrillation) 10/09/2018   Pain in joint of right hip 07/03/2020   Pain in right knee 12/14/2018   Recurrent syncope    Rib pain 07/03/2020   Sciatica of right side 09/22/2018   Severe alcohol use disorder    Vitamin D deficiency    Past Surgical History:  Procedure Laterality Date   APPENDECTOMY     CARDIAC CATHETERIZATION N/A 03/25/2015   Procedure: Left Heart Cath and Coronary Angiography;  Surgeon: Marykay Lex, MD;  Location: [facility] INVASIVE CV LAB;  Service: Cardiovascular;  Laterality: N/A;   CORONARY STENT INTERVENTION N/A 10/09/2018   Procedure: CORONARY STENT INTERVENTION;  Surgeon: Corky Crafts, MD;  Location: [facility] INVASIVE CV LAB;  Service: Cardiovascular;  Laterality: N/A;   DILATATION & CURETTAGE/HYSTEROSCOPY WITH MYOSURE N/A 05/19/2016   Procedure: DILATATION & CURETTAGE/HYSTEROSCOPY WITH MYOSURE;  Surgeon: Geryl Rankins, MD;  Location: [facility];  Service: Gynecology;  Laterality: N/A;  Possible Myosure for polyps.   EYE SURGERY     LEFT HEART CATH AND CORONARY ANGIOGRAPHY N/A 10/09/2018   Procedure: LEFT HEART CATH AND CORONARY ANGIOGRAPHY;  Surgeon: Corky Crafts, MD;  Location: [facility] INVASIVE CV LAB;  Service: Cardiovascular;  Laterality: N/A;   MOUTH SURGERY     URETHRAL DILATION  Patient Active Problem List   Diagnosis Date Noted   B12 deficiency 10/24/2023   Seasonal allergies 10/21/2023   Gastroesophageal reflux disease without esophagitis 10/21/2023   Epigastric pain 10/21/2023   Stress 02/02/2023   Insomnia 12/23/2022   Blood clotting disorder (HCC) 10/28/2022   Neck pain 10/27/2022   Acute thoracic back pain 10/27/2022   Lumbar radiculopathy 05/26/2022   Degeneration of lumbar intervertebral disc 05/26/2022   Major depressive disorder    Severe alcohol use disorder    Generalized anxiety disorder    Obstructive sleep apnea    Hepatic  steatosis 04/22/2022   Osteoarthritis of carpometacarpal (CMC) joint of thumb 04/20/2022   Acquired trigger finger of right ring finger 04/20/2022   Bilateral wrist pain 04/20/2022   Chalazion of left lower eyelid 04/15/2022   Bipolar 1 disorder (HCC)    Cervical radiculopathy 10/14/2021   Pain in joint of right hip 07/03/2020   Rib pain 07/03/2020   Recurrent syncope    Orthostasis    Lactic acidosis 12/18/2019   Hematemesis 12/18/2019   Pain in right knee 12/14/2018   PAF (paroxysmal atrial fibrillation) 10/09/2018   Dorsalgia 09/22/2018   Dysphagia 09/22/2018   GERD without esophagitis 09/22/2018   Irritable bowel syndrome without diarrhea 09/22/2018   Osteoarthritis of knee 09/22/2018   Polypharmacy 09/22/2018   Sciatica of right side 09/22/2018   Vitamin D deficiency 09/22/2018   Mixed hyperlipidemia 05/07/2016   Chronic atrial fibrillation (HCC) 09/08/2015   Coronary artery disease involving native coronary artery with angina pectoris 03/24/2015   Essential hypertension 03/03/2015   Chest pain 02/11/2015   Non-ST elevation MI (NSTEMI) (HCC) 07/26/2008    PCP: Chilton Si, MD  REFERRING PROVIDER: Elvin So, PA-C  REFERRING DIAG: [phone] (ICD-10-CM) - Pain in right knee  THERAPY DIAG:  Chronic pain of both knees  Difficulty in walking, not elsewhere classified  Muscle weakness (generalized)  Rationale for Evaluation and Treatment: Rehabilitation  ONSET DATE: 3 years ago  SUBJECTIVE:   SUBJECTIVE STATEMENT: My knees are a little better.   Pt reports ~3 year history of BIL knee pain, R>L. Does not endorse any specific injury, reports this was preceded by job with more time on feet. States symptoms slowly seem to be worsening, knee will occasionally buckle. Tends to fluctuate, oftentimes based on work activities. Has fluctuating swelling as well through lateral R knee. She also endorses history of neck/UE pain, cramping in legs and arms for which  she states she is in communication w/ providers. Also mentions a R shoulder subluxation a couple months ago, states she will be getting an MRI soon. Also reports seeing counselor weekly for mental health hx.   PERTINENT HISTORY: BPD1, afib, CAD w/ chest pain, dysphagia, HTN, GAD, depression, migraine, NSTEMI, OSA, recurrent syncope  PAIN:  Are you having pain: 0/10 Location/description: R>L knee, posterior and anterior Best-worst over past week: 2-7/10  - aggravating factors: walking, standing ,stairs - Easing factors: pain ointment, pain medication  PRECAUTIONS: cardiac hx     WEIGHT BEARING RESTRICTIONS: No  FALLS:  Has patient fallen in last 6 months? No  LIVING ENVIRONMENT: Uses cane PRN for balance Lives w/ cats; lives in split level, 2 sets of stairs - BIL rails, 6-8 vs 8-10 steps on each  OCCUPATION: cashier  PLOF: Independent - enjoys gardening, art  PATIENT GOALS: build strength/muscle, get back to art  NEXT MD VISIT: TBD  OBJECTIVE:  Note: Objective measures were completed at Evaluation unless otherwise noted.  DIAGNOSTIC FINDINGS:  No recent imaging in chart - pt states she has had XR, endorses some degenerative changes   PATIENT SURVEYS:   09/20/23 LEFS 27 / 80 = 33.8 % 10/25/23 29/80  COGNITION: Overall cognitive status: Within functional limits for tasks assessed     SENSATION: NT  EDEMA:  Pt endorses fluctuating edema in lateral R knee   LOWER EXTREMITY ROM:      Right eval Left eval  Hip flexion    Hip extension    Hip internal rotation    Hip external rotation    Knee extension A: full   A: full  Knee flexion A: 119 deg   A: 124 deg   (Blank rows = not tested) (Key: WFL = within functional limits not formally assessed, * = concordant pain, s = stiffness/stretching sensation, NT = not tested)  Comments:    10/27/23: R knee flex 130 deg    L knee flex 110 deg feels tight on top of knee  LOWER EXTREMITY MMT:    MMT Right eval  Left eval  Hip flexion 4 4  Hip abduction (modified sitting) 5 5  Hip internal rotation    Hip external rotation    Knee flexion 4+ 5  Knee extension 4+ 5  Ankle dorsiflexion 4+ 5   (Blank rows = not tested) (Key: WFL = within functional limits not formally assessed, * = concordant pain, s = stiffness/stretching sensation, NT = not tested)  Comments:    10/27/23:  hip flex R 4/5, L 4+5; R knee ext 5-/5, flex 4+/5,    LOWER EXTREMITY SPECIAL TESTS:  Deferred given time constraints  FUNCTIONAL TESTS:  5xSTS: 11.69sec w/ mild pain  TUG: 10.27sec w/o AD, antalgic gait  GAIT: Distance walked: within clinic Assistive device utilized: None Level of assistance: Complete Independence Comments: antalgic gait R>L, reduced gait speed/cadence                                                                                                                                TREATMENT DATE:   [facility] Adult PT Treatment:                                                DATE: 10/27/2023 Therapeutic Exercise: NuStep L6 x 6 min + subjective intake Bridge with feet on orange PB 2x10 S/L clamshell x 10 Blue band B 5 sec hold Bridge with blue band +clam 2 x 10 Standing knee flexion 3#AW x 20 B Standing heel raises 3#AW x 20 Therapeutic Activity: Goals assessed, ROM, MMT     [facility] Adult PT Treatment:  DATE: 10/25/2023 Therapeutic Exercise: NuStep L5 x 6 min + subjective intake Supine heel slides with orange PB  Neuromuscular re-ed: SAQ + 3#AW x20 (B) SLR small range for quad activation Therapeutic Activity: Timed STS LEFS   [facility] Adult PT Treatment:                                                DATE: 10/18/23 Therapeutic Exercise: Nustep L5 x 6 min at end of session for increased mobility Seated clamshell x 20 Blue band Seated LAQ B x 20 (add wt next time) Bridge with blue band +clam 2 x 10 Standing knee flexion x 20 B Standing heel raises x  20 Neuromuscular re-ed: SLR B x 15 Supine SAQ 5 sec hold x 20 Standing hip ABD x 10 B Standing hip Ext x 10 B Therapeutic Activity: Sit to stand  8# x 20   [facility] Adult PT Treatment:                                                DATE: 10/13/23 Therapeutic Exercise: Nustep L5 x 7 min at end of session for increased mobility Seated clamshell x 20 Blue band Seated LAQ B x 20 Neuromuscular re-ed: Supine quad set 5 sec hold x 20 - cues to avoid buttocks SLR B x 15 Supine SAQ 5 sec hold x 20   [facility] Adult PT Treatment:                                                DATE: 09/29/23 Therapeutic Exercise: Nustep L5 x 5.5 min status discussed Seated clamshell x 20 Blue band Bridge 10 sec x 5, then with blue band +clam x 10 Seated LAQ B x 20 Prone knee flexion x 10 B Prone knee flexion stretch x 30 sec B  Neuromuscular re-ed: Supine quad set 5 sec hold x 10 - cues to avoid buttocks Prone quad set 5 sec hold x 10 (supine is better) Supine SAQ 5 sec hold x 20 Supine SLR and with ER - unable to do so moved to S/L SLR B x 15 Therapeutic Activity: Sit to stand x 10 with blue band, sit to stand 8# x 15     PATIENT EDUCATION:  Education details: HEP established Person educated: Patient Education method: Explanation, Demonstration, Tactile cues, Verbal cues Education comprehension: verbalized understanding, returned demonstration, verbal cues required, tactile cues required, and needs further education    HOME EXERCISE PROGRAM: Access Code: [email or web address] URL: [email or web address] Date: 10/13/2023 Prepared by: Raynelle Fanning  Exercises - Supine Quadricep Sets  - 1 x daily - 7 x weekly - 2 sets - 15 reps - 5 second hold - Supine Knee Extension Strengthening  - 1 x daily - 7 x weekly - 2 sets - 15 reps - Sidelying Hip Flexion  - 1 x daily - 3 x weekly - 2 sets - 15 reps - Sidelying Hip Abduction  - 1 x daily - 3 x weekly - 2 sets - 15 reps - Squat with Chair Touch  - 1 x daily - 3 x  weekly - 1-2 sets - 10 reps - Seated Long Arc Quad  - 1 x daily - 7 x weekly - 22 sets - 15 reps - Supine Bridge  - 1 x daily - 7 x weekly - 1-3 sets - 10 reps - 10 sec hold - Supine Active Straight Leg Raise  - 2 x daily - 7 x weekly - 3 sets - 10 reps - Seated Hip Abduction with Resistance  - 1 x daily - 3 x weekly - 2 sets - 10 reps  ASSESSMENT:  CLINICAL IMPRESSION: Pamala is progressing well with her pain control on the R knee. Her L knee has increased pain and she has lost ROM since eval secondary to a recent fall. Her R knee has improved to 130 deg from 119 deg at eval. She is making gains with strength, but still does not feel fully stable on the R. She states she has days where she does quite well with standing at work, but generally has increased pain at EOD. She has met 2/6 LTGs and shows functional improvement on her LEFS by 2 points. She continues to demonstrate potential for improvement and would benefit from continued skilled therapy to address her remaining impairments.  I recommend 2x/wk for 4 weeks.   OBJECTIVE IMPAIRMENTS: Abnormal gait, decreased activity tolerance, decreased balance, decreased endurance, decreased mobility, difficulty walking, decreased ROM, decreased strength, and pain.   ACTIVITY LIMITATIONS: carrying, lifting, bending, standing, squatting, sleeping, stairs, transfers, and locomotion level  PARTICIPATION LIMITATIONS: meal prep, cleaning, laundry, community activity, and occupation  PERSONAL FACTORS: Age, Time since onset of injury/illness/exacerbation, and 3+ comorbidities: BPD1, afib, CAD w/ chest pain, dysphagia, HTN, GAD, depression, migraine, NSTEMI, OSA, recurrent syncope  are also affecting patient's functional outcome.   REHAB POTENTIAL: Good  CLINICAL DECISION MAKING: Evolving/moderate complexity  EVALUATION COMPLEXITY: Moderate   GOALS:  SHORT TERM GOALS: Target date: 10/04/2023 Pt will demonstrate appropriate understanding and performance  of initially prescribed HEP in order to facilitate improved independence with management of symptoms.  Baseline: HEP  Goal status: IN PROGRESS  2. Pt will report at least 25% improvement in overall pain levels over past week in order to facilitate improved tolerance to typical daily activities.   Baseline: 2-7/10  Goal status: IN PROGRESS   LONG TERM GOALS: Target date: 11/24/2023 Pt will improve MCID or greater on LEFS in order to demonstrate improved perception of function due to symptoms (MCID 9 pts) Baseline: LEFS 27/80 Goal status: IN PROGRESS L 10/25/23: 29/80 = 36.3%  2.  Pt will demonstrate 5/5 MMT BIL knees/ankles for improved functional strength.  Baseline: See MMT chart above Goal status: IN PROGRESS  3.  Pt will report no more than 4/10 knee pain on NPS with average work day in order to facilitate improved tolerance to work tasks.  Baseline: increased pain at end of work day, up to 7/10; 10/25/23: 8/10 Goal status: IN PROGRESS  4.  Pt will be able to perform 5xSTS in less than or equal to 9sec in order to demonstrate reduced fall risk and improved functional independence (MCID 5xSTS = 2.3 sec). Baseline: 11sec; 10/25/23: 9 sec Goal status: MET  5. Pt will report at least 50% decrease in overall pain levels in past week in order to facilitate improved tolerance to basic ADLs/mobility.   Baseline: 2-7/10  Goal status: MET for R knee 50%; IN PROGRESS L KNEE   6. Pt will demonstrate appropriate performance of final prescribed HEP in order to facilitate improved  self-management of symptoms post-discharge.   Baseline: HEP TBD  Goal status: IN PROGRESS   PLAN:  PT FREQUENCY: 1-2x/week  PT DURATION: 4 weeks  PLANNED INTERVENTIONS: 97164- PT Re-evaluation, 97110-Therapeutic exercises, 97530- Therapeutic activity, 97112- Neuromuscular re-education, 97535- Self Care, 16109- Manual therapy, [phone]- Gait training, Patient/Family education, Balance training, Stair training, Taping, Dry  Needling, Joint mobilization, Cryotherapy, and Moist heat  PLAN FOR NEXT SESSION: continue with B LE strength and L knee ROM    Solon Palm, PT  10/27/2023 1:05 PM

## 2023-10-31 DIAGNOSIS — R112 Nausea with vomiting, unspecified: Secondary | ICD-10-CM | POA: Diagnosis not present

## 2023-10-31 DIAGNOSIS — K219 Gastro-esophageal reflux disease without esophagitis: Secondary | ICD-10-CM | POA: Diagnosis not present

## 2023-11-01 ENCOUNTER — Ambulatory Visit

## 2023-11-01 ENCOUNTER — Ambulatory Visit (INDEPENDENT_AMBULATORY_CARE_PROVIDER_SITE_OTHER): Payer: Self-pay | Admitting: Licensed Clinical Social Worker

## 2023-11-01 ENCOUNTER — Encounter (HOSPITAL_COMMUNITY): Payer: Self-pay

## 2023-11-01 DIAGNOSIS — R262 Difficulty in walking, not elsewhere classified: Secondary | ICD-10-CM

## 2023-11-01 DIAGNOSIS — Z91199 Patient's noncompliance with other medical treatment and regimen due to unspecified reason: Secondary | ICD-10-CM

## 2023-11-01 DIAGNOSIS — M25562 Pain in left knee: Secondary | ICD-10-CM | POA: Diagnosis not present

## 2023-11-01 DIAGNOSIS — M6281 Muscle weakness (generalized): Secondary | ICD-10-CM | POA: Diagnosis not present

## 2023-11-01 DIAGNOSIS — G8929 Other chronic pain: Secondary | ICD-10-CM | POA: Diagnosis not present

## 2023-11-01 DIAGNOSIS — M25561 Pain in right knee: Secondary | ICD-10-CM | POA: Diagnosis not present

## 2023-11-01 NOTE — Therapy (Signed)
 OUTPATIENT PHYSICAL THERAPY LOWER EXTREMITY TREATMENT   Patient Name: Lindsay [name] MRN: [id number] DOB:22-Dec-1955, 68 y.o., female Today's Date: 11/01/2023  END OF SESSION:  PT End of Session - 11/01/23 1100     Visit Number 8    Number of Visits 13    Date for PT Re-Evaluation 11/24/23    Authorization Type Medicare    Progress Note Due on Visit 17    PT Start Time 1100    PT Stop Time 1148    PT Time Calculation (min) 48 min    Activity Tolerance Patient tolerated treatment well    Behavior During Therapy [facility] for tasks assessed/performed            Past Medical History:  Diagnosis Date   Abnormal mammogram    Thick Tissue   Acquired trigger finger of right ring finger 04/20/2022   Arthritis    Bilateral wrist pain 04/20/2022   Bipolar 1 disorder    Cataract    OU   Cervical radiculopathy 10/14/2021   Chalazion of left lower eyelid 04/15/2022   Chest pain 02/11/2015   Chronic atrial fibrillation 09/08/2015   Colon polyp    Compressed cervical disc    Coronary artery disease involving native coronary artery with angina pectoris 03/24/2015   Dorsalgia 09/22/2018   Dysphagia 09/22/2018   Essential hypertension 03/03/2015   Generalized anxiety disorder    GERD without esophagitis 09/22/2018   Hematemesis 12/18/2019   Hepatic steatosis 04/22/2022   History of appendectomy 1963   Hypertensive retinopathy    OU   Irritable bowel syndrome without diarrhea 09/22/2018   Lactic acidosis 12/18/2019   LLQ pain 04/15/2022   Major depressive disorder    Migraine without status migrainosus, not intractable 09/22/2018   Mixed hyperlipidemia 05/07/2016   Non-ST elevation MI (NSTEMI) 07/26/2008   No cardiac catheterization at the time.   Obstructive sleep apnea    no CPAP use   Orthostasis    Osteoarthritis of carpometacarpal (CMC) joint of thumb 04/20/2022   Osteoarthritis of knee 09/22/2018   PAF (paroxysmal atrial fibrillation) 10/09/2018   Pain in joint of right hip  07/03/2020   Pain in right knee 12/14/2018   Recurrent syncope    Rib pain 07/03/2020   Sciatica of right side 09/22/2018   Severe alcohol use disorder    Vitamin D deficiency    Past Surgical History:  Procedure Laterality Date   APPENDECTOMY     CARDIAC CATHETERIZATION N/A 03/25/2015   Procedure: Left Heart Cath and Coronary Angiography;  Surgeon: Marykay Lex, MD;  Location: [facility] INVASIVE CV LAB;  Service: Cardiovascular;  Laterality: N/A;   CORONARY STENT INTERVENTION N/A 10/09/2018   Procedure: CORONARY STENT INTERVENTION;  Surgeon: Corky Crafts, MD;  Location: [facility] INVASIVE CV LAB;  Service: Cardiovascular;  Laterality: N/A;   DILATATION & CURETTAGE/HYSTEROSCOPY WITH MYOSURE N/A 05/19/2016   Procedure: DILATATION & CURETTAGE/HYSTEROSCOPY WITH MYOSURE;  Surgeon: Geryl Rankins, MD;  Location: [facility];  Service: Gynecology;  Laterality: N/A;  Possible Myosure for polyps.   EYE SURGERY     LEFT HEART CATH AND CORONARY ANGIOGRAPHY N/A 10/09/2018   Procedure: LEFT HEART CATH AND CORONARY ANGIOGRAPHY;  Surgeon: Corky Crafts, MD;  Location: [facility] INVASIVE CV LAB;  Service: Cardiovascular;  Laterality: N/A;   MOUTH SURGERY     URETHRAL DILATION     Patient Active Problem List   Diagnosis Date Noted   B12 deficiency 10/24/2023   Seasonal allergies 10/21/2023   Gastroesophageal  reflux disease without esophagitis 10/21/2023   Epigastric pain 10/21/2023   Stress 02/02/2023   Insomnia 12/23/2022   Blood clotting disorder (HCC) 10/28/2022   Neck pain 10/27/2022   Acute thoracic back pain 10/27/2022   Lumbar radiculopathy 05/26/2022   Degeneration of lumbar intervertebral disc 05/26/2022   Major depressive disorder    Severe alcohol use disorder    Generalized anxiety disorder    Obstructive sleep apnea    Hepatic steatosis 04/22/2022   Osteoarthritis of carpometacarpal (CMC) joint of thumb 04/20/2022   Acquired trigger finger of right ring finger 04/20/2022   Bilateral  wrist pain 04/20/2022   Chalazion of left lower eyelid 04/15/2022   Bipolar 1 disorder (HCC)    Cervical radiculopathy 10/14/2021   Pain in joint of right hip 07/03/2020   Rib pain 07/03/2020   Recurrent syncope    Orthostasis    Lactic acidosis 12/18/2019   Hematemesis 12/18/2019   Pain in right knee 12/14/2018   PAF (paroxysmal atrial fibrillation) 10/09/2018   Dorsalgia 09/22/2018   Dysphagia 09/22/2018   GERD without esophagitis 09/22/2018   Irritable bowel syndrome without diarrhea 09/22/2018   Osteoarthritis of knee 09/22/2018   Polypharmacy 09/22/2018   Sciatica of right side 09/22/2018   Vitamin D deficiency 09/22/2018   Mixed hyperlipidemia 05/07/2016   Chronic atrial fibrillation (HCC) 09/08/2015   Coronary artery disease involving native coronary artery with angina pectoris 03/24/2015   Essential hypertension 03/03/2015   Chest pain 02/11/2015   Non-ST elevation MI (NSTEMI) (HCC) 07/26/2008    PCP: Chilton Si, MD  REFERRING PROVIDER: Elvin So, PA-C  REFERRING DIAG: [phone] (ICD-10-CM) - Pain in right knee  THERAPY DIAG:  Chronic pain of both knees  Difficulty in walking, not elsewhere classified  Muscle weakness (generalized)  Rationale for Evaluation and Treatment: Rehabilitation  ONSET DATE: 3 years ago  SUBJECTIVE:   SUBJECTIVE STATEMENT: Patient reports her knees continue to fluctuate, states today is a good day with her knees. Patient states her knees are most bothered by squatting and standing for long periods of time.   Pt reports ~3 year history of BIL knee pain, R>L. Does not endorse any specific injury, reports this was preceded by job with more time on feet. States symptoms slowly seem to be worsening, knee will occasionally buckle. Tends to fluctuate, oftentimes based on work activities. Has fluctuating swelling as well through lateral R knee. She also endorses history of neck/UE pain, cramping in legs and arms for which she  states she is in communication w/ providers. Also mentions a R shoulder subluxation a couple months ago, states she will be getting an MRI soon. Also reports seeing counselor weekly for mental health hx.   PERTINENT HISTORY: BPD1, afib, CAD w/ chest pain, dysphagia, HTN, GAD, depression, migraine, NSTEMI, OSA, recurrent syncope  PAIN:  Are you having pain: 0/10 Location/description: R>L knee, posterior and anterior Best-worst over past week: 2-7/10  - aggravating factors: walking, standing ,stairs - Easing factors: pain ointment, pain medication  PRECAUTIONS: cardiac hx     WEIGHT BEARING RESTRICTIONS: No  FALLS:  Has patient fallen in last 6 months? No  LIVING ENVIRONMENT: Uses cane PRN for balance Lives w/ cats; lives in split level, 2 sets of stairs - BIL rails, 6-8 vs 8-10 steps on each  OCCUPATION: cashier  PLOF: Independent - enjoys gardening, art  PATIENT GOALS: build strength/muscle, get back to art  NEXT MD VISIT: TBD  OBJECTIVE:  Note: Objective measures were completed at Evaluation  unless otherwise noted.  DIAGNOSTIC FINDINGS:  No recent imaging in chart - pt states she has had XR, endorses some degenerative changes   PATIENT SURVEYS:   09/20/23 LEFS 27 / 80 = 33.8 % 10/25/23 29/80  COGNITION: Overall cognitive status: Within functional limits for tasks assessed     SENSATION: NT  EDEMA:  Pt endorses fluctuating edema in lateral R knee   LOWER EXTREMITY ROM:      Right eval Left eval  Hip flexion    Hip extension    Hip internal rotation    Hip external rotation    Knee extension A: full   A: full  Knee flexion A: 119 deg   A: 124 deg   (Blank rows = not tested) (Key: WFL = within functional limits not formally assessed, * = concordant pain, s = stiffness/stretching sensation, NT = not tested)  Comments:    10/27/23: R knee flex 130 deg    L knee flex 110 deg feels tight on top of knee  LOWER EXTREMITY MMT:    MMT Right eval  Left eval  Hip flexion 4 4  Hip abduction (modified sitting) 5 5  Hip internal rotation    Hip external rotation    Knee flexion 4+ 5  Knee extension 4+ 5  Ankle dorsiflexion 4+ 5   (Blank rows = not tested) (Key: WFL = within functional limits not formally assessed, * = concordant pain, s = stiffness/stretching sensation, NT = not tested)  Comments:    10/27/23:  hip flex R 4/5, L 4+5; R knee ext 5-/5, flex 4+/5,    LOWER EXTREMITY SPECIAL TESTS:  Deferred given time constraints  FUNCTIONAL TESTS:  5xSTS: 11.69sec w/ mild pain  TUG: 10.27sec w/o AD, antalgic gait  GAIT: Distance walked: within clinic Assistive device utilized: None Level of assistance: Complete Independence Comments: antalgic gait R>L, reduced gait speed/cadence                                                                                                                                TREATMENT DATE:  [facility] Adult PT Treatment:                                                DATE: 11/01/2023 Therapeutic Exercise: NuStep L7-6 x 5 min Bridge with feet on orange PB 3x10 Standing knee flexion 3#AW x 15 (B) Neuromuscular re-ed: Side Lying: (pelvic stabilization & postural awareness) Bent knee hip abd (hydrants) + GTB x15 (B) Clamshells + blue TB x20 (B) Therapeutic Activity: Standing squats with seat tap Staggered stance STS x10 each leg    [facility] Adult PT Treatment:  DATE: 10/27/2023 Therapeutic Exercise: NuStep L6 x 6 min + subjective intake Bridge with feet on orange PB 2x10 S/L clamshell x 10 Blue band B 5 sec hold Bridge with blue band +clam 2 x 10 Standing knee flexion 3#AW x 20 B Standing heel raises 3#AW x 20 Therapeutic Activity: Goals assessed, ROM, MMT     [facility] Adult PT Treatment:                                                DATE: 10/25/2023 Therapeutic Exercise: NuStep L5 x 6 min + subjective intake Supine heel slides with orange PB   Neuromuscular re-ed: SAQ + 3#AW x20 (B) SLR small range for quad activation Therapeutic Activity: Timed STS LEFS   PATIENT EDUCATION:  Education details: Updated HEP Person educated: Patient Education method: Explanation, Demonstration, Tactile cues, Verbal cues Education comprehension: verbalized understanding, returned demonstration, verbal cues required, tactile cues required, and needs further education    HOME EXERCISE PROGRAM: Access Code: [email or web address] URL: [email or web address] Date: 11/01/2023 Prepared by: Carlynn Herald  Exercises - Supine Knee Extension Strengthening  - 1 x daily - 7 x weekly - 2 sets - 15 reps - Sidelying Hip Flexion  - 1 x daily - 3 x weekly - 2 sets - 15 reps - Squat with Chair Touch  - 1 x daily - 3 x weekly - 1-2 sets - 10 reps - Seated Long Arc Quad  - 1 x daily - 7 x weekly - 22 sets - 15 reps - Supine Bridge  - 1 x daily - 7 x weekly - 1-3 sets - 10 reps - 10 sec hold - Seated Hip Abduction with Resistance  - 1 x daily - 3 x weekly - 2 sets - 10 reps - Small Range Straight Leg Raise  - 1 x daily - 7 x weekly - 3 sets - 10 reps - Wall Quarter Squat  - 1 x daily - 7 x weekly - 3 sets - 10 reps - 5 sec hold - Sidelying Bent Knee Lift at 45 Degrees  - 1 x daily - 7 x weekly - 3 sets - 10 reps - Clam with Resistance  - 1 x daily - 7 x weekly - 3 sets - 10 reps  ASSESSMENT:  CLINICAL IMPRESSION: Patient tolerated all exercises well with no exacerbation of pain, demonstrating improved bilateral knee flexion AROM. Functional squatting exercises incorporated to address knee discomfort with gardening activities and progress quad strengthening.    OBJECTIVE IMPAIRMENTS: Abnormal gait, decreased activity tolerance, decreased balance, decreased endurance, decreased mobility, difficulty walking, decreased ROM, decreased strength, and pain.   ACTIVITY LIMITATIONS: carrying, lifting, bending, standing, squatting, sleeping, stairs, transfers, and  locomotion level  PARTICIPATION LIMITATIONS: meal prep, cleaning, laundry, community activity, and occupation  PERSONAL FACTORS: Age, Time since onset of injury/illness/exacerbation, and 3+ comorbidities: BPD1, afib, CAD w/ chest pain, dysphagia, HTN, GAD, depression, migraine, NSTEMI, OSA, recurrent syncope  are also affecting patient's functional outcome.   REHAB POTENTIAL: Good  CLINICAL DECISION MAKING: Evolving/moderate complexity  EVALUATION COMPLEXITY: Moderate   GOALS:  SHORT TERM GOALS: Target date: 10/04/2023 Pt will demonstrate appropriate understanding and performance of initially prescribed HEP in order to facilitate improved independence with management of symptoms.  Baseline: HEP  Goal status: IN PROGRESS  2. Pt will report at least 25% improvement in overall pain levels  over past week in order to facilitate improved tolerance to typical daily activities.   Baseline: 2-7/10  Goal status: IN PROGRESS   LONG TERM GOALS: Target date: 11/24/2023 Pt will improve MCID or greater on LEFS in order to demonstrate improved perception of function due to symptoms (MCID 9 pts) Baseline: LEFS 27/80 Goal status: IN PROGRESS L 10/25/23: 29/80 = 36.3%  2.  Pt will demonstrate 5/5 MMT BIL knees/ankles for improved functional strength.  Baseline: See MMT chart above Goal status: IN PROGRESS  3.  Pt will report no more than 4/10 knee pain on NPS with average work day in order to facilitate improved tolerance to work tasks.  Baseline: increased pain at end of work day, up to 7/10; 10/25/23: 8/10 Goal status: IN PROGRESS  4.  Pt will be able to perform 5xSTS in less than or equal to 9sec in order to demonstrate reduced fall risk and improved functional independence (MCID 5xSTS = 2.3 sec). Baseline: 11sec; 10/25/23: 9 sec Goal status: MET  5. Pt will report at least 50% decrease in overall pain levels in past week in order to facilitate improved tolerance to basic ADLs/mobility.   Baseline:  2-7/10  Goal status: MET for R knee 50%; IN PROGRESS L KNEE   6. Pt will demonstrate appropriate performance of final prescribed HEP in order to facilitate improved self-management of symptoms post-discharge.   Baseline: HEP TBD  Goal status: IN PROGRESS   PLAN:  PT FREQUENCY: 1-2x/week  PT DURATION: 4 weeks  PLANNED INTERVENTIONS: 97164- PT Re-evaluation, 97110-Therapeutic exercises, 97530- Therapeutic activity, 97112- Neuromuscular re-education, 97535- Self Care, 16109- Manual therapy, [phone]- Gait training, Patient/Family education, Balance training, Stair training, Taping, Dry Needling, Joint mobilization, Cryotherapy, and Moist heat  PLAN FOR NEXT SESSION: continue with B LE strength and L knee ROM    Carlynn Herald, PTA  11/01/2023 11:56 AM

## 2023-11-01 NOTE — Progress Notes (Signed)
 Patient did not show for appointment.

## 2023-11-02 NOTE — Telephone Encounter (Signed)
 Messaged Dr. Duke Salvia via secure chat who has given permission to hold Plavix for 5 days. On 11/02/2023 at 220pm.

## 2023-11-03 ENCOUNTER — Ambulatory Visit

## 2023-11-03 DIAGNOSIS — M6281 Muscle weakness (generalized): Secondary | ICD-10-CM

## 2023-11-03 DIAGNOSIS — R262 Difficulty in walking, not elsewhere classified: Secondary | ICD-10-CM | POA: Diagnosis not present

## 2023-11-03 DIAGNOSIS — M25561 Pain in right knee: Secondary | ICD-10-CM | POA: Diagnosis not present

## 2023-11-03 DIAGNOSIS — G8929 Other chronic pain: Secondary | ICD-10-CM | POA: Diagnosis not present

## 2023-11-03 DIAGNOSIS — M25562 Pain in left knee: Secondary | ICD-10-CM | POA: Diagnosis not present

## 2023-11-03 NOTE — Therapy (Signed)
 OUTPATIENT PHYSICAL THERAPY LOWER EXTREMITY TREATMENT   Patient Name: Lindsay [name] MRN: [id number] DOB:1956/05/03, 68 y.o., female Today's Date: 11/03/2023  END OF SESSION:  PT End of Session - 11/03/23 1315     Visit Number 9    Number of Visits 13    Date for PT Re-Evaluation 11/24/23    Authorization Type Medicare    Progress Note Due on Visit 17    PT Start Time 1315    PT Stop Time 1358    PT Time Calculation (min) 43 min    Activity Tolerance Patient tolerated treatment well    Behavior During Therapy [facility] for tasks assessed/performed            Past Medical History:  Diagnosis Date   Abnormal mammogram    Thick Tissue   Acquired trigger finger of right ring finger 04/20/2022   Arthritis    Bilateral wrist pain 04/20/2022   Bipolar 1 disorder    Cataract    OU   Cervical radiculopathy 10/14/2021   Chalazion of left lower eyelid 04/15/2022   Chest pain 02/11/2015   Chronic atrial fibrillation 09/08/2015   Colon polyp    Compressed cervical disc    Coronary artery disease involving native coronary artery with angina pectoris 03/24/2015   Dorsalgia 09/22/2018   Dysphagia 09/22/2018   Essential hypertension 03/03/2015   Generalized anxiety disorder    GERD without esophagitis 09/22/2018   Hematemesis 12/18/2019   Hepatic steatosis 04/22/2022   History of appendectomy 1963   Hypertensive retinopathy    OU   Irritable bowel syndrome without diarrhea 09/22/2018   Lactic acidosis 12/18/2019   LLQ pain 04/15/2022   Major depressive disorder    Migraine without status migrainosus, not intractable 09/22/2018   Mixed hyperlipidemia 05/07/2016   Non-ST elevation MI (NSTEMI) 07/26/2008   No cardiac catheterization at the time.   Obstructive sleep apnea    no CPAP use   Orthostasis    Osteoarthritis of carpometacarpal (CMC) joint of thumb 04/20/2022   Osteoarthritis of knee 09/22/2018   PAF (paroxysmal atrial fibrillation) 10/09/2018   Pain in joint of right  hip 07/03/2020   Pain in right knee 12/14/2018   Recurrent syncope    Rib pain 07/03/2020   Sciatica of right side 09/22/2018   Severe alcohol use disorder    Vitamin D deficiency    Past Surgical History:  Procedure Laterality Date   APPENDECTOMY     CARDIAC CATHETERIZATION N/A 03/25/2015   Procedure: Left Heart Cath and Coronary Angiography;  Surgeon: Marykay Lex, MD;  Location: [facility] INVASIVE CV LAB;  Service: Cardiovascular;  Laterality: N/A;   CORONARY STENT INTERVENTION N/A 10/09/2018   Procedure: CORONARY STENT INTERVENTION;  Surgeon: Corky Crafts, MD;  Location: [facility] INVASIVE CV LAB;  Service: Cardiovascular;  Laterality: N/A;   DILATATION & CURETTAGE/HYSTEROSCOPY WITH MYOSURE N/A 05/19/2016   Procedure: DILATATION & CURETTAGE/HYSTEROSCOPY WITH MYOSURE;  Surgeon: Geryl Rankins, MD;  Location: [facility];  Service: Gynecology;  Laterality: N/A;  Possible Myosure for polyps.   EYE SURGERY     LEFT HEART CATH AND CORONARY ANGIOGRAPHY N/A 10/09/2018   Procedure: LEFT HEART CATH AND CORONARY ANGIOGRAPHY;  Surgeon: Corky Crafts, MD;  Location: [facility] INVASIVE CV LAB;  Service: Cardiovascular;  Laterality: N/A;   MOUTH SURGERY     URETHRAL DILATION     Patient Active Problem List   Diagnosis Date Noted   B12 deficiency 10/24/2023   Seasonal allergies 10/21/2023   Gastroesophageal  reflux disease without esophagitis 10/21/2023   Epigastric pain 10/21/2023   Stress 02/02/2023   Insomnia 12/23/2022   Blood clotting disorder (HCC) 10/28/2022   Neck pain 10/27/2022   Acute thoracic back pain 10/27/2022   Lumbar radiculopathy 05/26/2022   Degeneration of lumbar intervertebral disc 05/26/2022   Major depressive disorder    Severe alcohol use disorder    Generalized anxiety disorder    Obstructive sleep apnea    Hepatic steatosis 04/22/2022   Osteoarthritis of carpometacarpal (CMC) joint of thumb 04/20/2022   Acquired trigger finger of right ring finger 04/20/2022   Bilateral  wrist pain 04/20/2022   Chalazion of left lower eyelid 04/15/2022   Bipolar 1 disorder (HCC)    Cervical radiculopathy 10/14/2021   Pain in joint of right hip 07/03/2020   Rib pain 07/03/2020   Recurrent syncope    Orthostasis    Lactic acidosis 12/18/2019   Hematemesis 12/18/2019   Pain in right knee 12/14/2018   PAF (paroxysmal atrial fibrillation) 10/09/2018   Dorsalgia 09/22/2018   Dysphagia 09/22/2018   GERD without esophagitis 09/22/2018   Irritable bowel syndrome without diarrhea 09/22/2018   Osteoarthritis of knee 09/22/2018   Polypharmacy 09/22/2018   Sciatica of right side 09/22/2018   Vitamin D deficiency 09/22/2018   Mixed hyperlipidemia 05/07/2016   Chronic atrial fibrillation (HCC) 09/08/2015   Coronary artery disease involving native coronary artery with angina pectoris 03/24/2015   Essential hypertension 03/03/2015   Chest pain 02/11/2015   Non-ST elevation MI (NSTEMI) (HCC) 07/26/2008    PCP: Chilton Si, MD  REFERRING PROVIDER: Elvin So, PA-C  REFERRING DIAG: [phone] (ICD-10-CM) - Pain in right knee  THERAPY DIAG:  Chronic pain of both knees  Difficulty in walking, not elsewhere classified  Muscle weakness (generalized)  Rationale for Evaluation and Treatment: Rehabilitation  ONSET DATE: 3 years ago  SUBJECTIVE:   SUBJECTIVE STATEMENT: Patient reports she was able to do a lot more work in her yard than in the past, states she was sore afterwards but feels good today.   Pt reports ~3 year history of BIL knee pain, R>L. Does not endorse any specific injury, reports this was preceded by job with more time on feet. States symptoms slowly seem to be worsening, knee will occasionally buckle. Tends to fluctuate, oftentimes based on work activities. Has fluctuating swelling as well through lateral R knee. She also endorses history of neck/UE pain, cramping in legs and arms for which she states she is in communication w/ providers. Also  mentions a R shoulder subluxation a couple months ago, states she will be getting an MRI soon. Also reports seeing counselor weekly for mental health hx.   PERTINENT HISTORY: BPD1, afib, CAD w/ chest pain, dysphagia, HTN, GAD, depression, migraine, NSTEMI, OSA, recurrent syncope  PAIN:  Are you having pain: 0/10 Location/description: R>L knee, posterior and anterior Best-worst over past week: 2-7/10  - aggravating factors: walking, standing ,stairs - Easing factors: pain ointment, pain medication  PRECAUTIONS: cardiac hx     WEIGHT BEARING RESTRICTIONS: No  FALLS:  Has patient fallen in last 6 months? No  LIVING ENVIRONMENT: Uses cane PRN for balance Lives w/ cats; lives in split level, 2 sets of stairs - BIL rails, 6-8 vs 8-10 steps on each  OCCUPATION: cashier  PLOF: Independent - enjoys gardening, art  PATIENT GOALS: build strength/muscle, get back to art  NEXT MD VISIT: TBD  OBJECTIVE:  Note: Objective measures were completed at Evaluation unless otherwise noted.  DIAGNOSTIC  FINDINGS:  No recent imaging in chart - pt states she has had XR, endorses some degenerative changes   PATIENT SURVEYS:   09/20/23 LEFS 27 / 80 = 33.8 % 10/25/23 29/80  COGNITION: Overall cognitive status: Within functional limits for tasks assessed     SENSATION: NT  EDEMA:  Pt endorses fluctuating edema in lateral R knee   LOWER EXTREMITY ROM:      Right eval Left eval  Hip flexion    Hip extension    Hip internal rotation    Hip external rotation    Knee extension A: full   A: full  Knee flexion A: 119 deg   A: 124 deg   (Blank rows = not tested) (Key: WFL = within functional limits not formally assessed, * = concordant pain, s = stiffness/stretching sensation, NT = not tested)  Comments:    10/27/23: R knee flex 130 deg    L knee flex 110 deg feels tight on top of knee  LOWER EXTREMITY MMT:    MMT Right eval Left eval  Hip flexion 4 4  Hip abduction (modified  sitting) 5 5  Hip internal rotation    Hip external rotation    Knee flexion 4+ 5  Knee extension 4+ 5  Ankle dorsiflexion 4+ 5   (Blank rows = not tested) (Key: WFL = within functional limits not formally assessed, * = concordant pain, s = stiffness/stretching sensation, NT = not tested)  Comments:    10/27/23:  hip flex R 4/5, L 4+5; R knee ext 5-/5, flex 4+/5,    LOWER EXTREMITY SPECIAL TESTS:  Deferred given time constraints  FUNCTIONAL TESTS:  5xSTS: 11.69sec w/ mild pain  TUG: 10.27sec w/o AD, antalgic gait  GAIT: Distance walked: within clinic Assistive device utilized: None Level of assistance: Complete Independence Comments: antalgic gait R>L, reduced gait speed/cadence                                                                                                                              TREATMENT DATE:  [facility] Adult PT Treatment:                                                DATE: 11/03/2023 Therapeutic Exercise: NuStep L6 x 5 min Straight leg bridges with orange PB 2x15 Leg press: DL 02# [id number] --> 66.4# x 10 Neuromuscular re-ed: Slow single leg step up on 6" step --> fingertip touch for support (B) Front heel tap down on 4" step + HHAx2 Side Lying: (pelvic stabilization & postural awareness) Bent knee hip abd (hydrants) + GTB x20 (B) Clamshells + GTB x20 (B) Straight leg hip abd + GTB x10    [facility] Adult PT Treatment:  DATE: 11/01/2023 Therapeutic Exercise: NuStep L7-6 x 5 min Bridge with feet on orange PB 3x10 Standing knee flexion 3#AW x 15 (B) Neuromuscular re-ed: Side Lying: (pelvic stabilization & postural awareness) Bent knee hip abd (hydrants) + GTB x15 (B) Clamshells + blue TB x20 (B) Therapeutic Activity: Standing squats with seat tap Staggered stance STS x10 each leg    [facility] Adult PT Treatment:                                                DATE: 10/27/2023 Therapeutic Exercise: NuStep L6 x 6 min  + subjective intake Bridge with feet on orange PB 2x10 S/L clamshell x 10 Blue band B 5 sec hold Bridge with blue band +clam 2 x 10 Standing knee flexion 3#AW x 20 B Standing heel raises 3#AW x 20 Therapeutic Activity: Goals assessed, ROM, MMT   PATIENT EDUCATION:  Education details: Updated HEP Person educated: Patient Education method: Explanation, Demonstration, Tactile cues, Verbal cues Education comprehension: verbalized understanding, returned demonstration, verbal cues required, tactile cues required, and needs further education    HOME EXERCISE PROGRAM: Access Code: [email or web address] URL: [email or web address] Date: 11/01/2023 Prepared by: Carlynn Herald  Exercises - Supine Knee Extension Strengthening  - 1 x daily - 7 x weekly - 2 sets - 15 reps - Sidelying Hip Flexion  - 1 x daily - 3 x weekly - 2 sets - 15 reps - Squat with Chair Touch  - 1 x daily - 3 x weekly - 1-2 sets - 10 reps - Seated Long Arc Quad  - 1 x daily - 7 x weekly - 22 sets - 15 reps - Supine Bridge  - 1 x daily - 7 x weekly - 1-3 sets - 10 reps - 10 sec hold - Seated Hip Abduction with Resistance  - 1 x daily - 3 x weekly - 2 sets - 10 reps - Small Range Straight Leg Raise  - 1 x daily - 7 x weekly - 3 sets - 10 reps - Wall Quarter Squat  - 1 x daily - 7 x weekly - 3 sets - 10 reps - 5 sec hold - Sidelying Bent Knee Lift at 45 Degrees  - 1 x daily - 7 x weekly - 3 sets - 10 reps - Clam with Resistance  - 1 x daily - 7 x weekly - 3 sets - 10 reps   ASSESSMENT:  CLINICAL IMPRESSION: Single leg exercises incorporated to challenge LE stability and dynamic balance. Leg press incorporated to progress closed chain LE strengthening. Patient able to complete all exercises   OBJECTIVE IMPAIRMENTS: Abnormal gait, decreased activity tolerance, decreased balance, decreased endurance, decreased mobility, difficulty walking, decreased ROM, decreased strength, and pain.   ACTIVITY LIMITATIONS: carrying,  lifting, bending, standing, squatting, sleeping, stairs, transfers, and locomotion level  PARTICIPATION LIMITATIONS: meal prep, cleaning, laundry, community activity, and occupation  PERSONAL FACTORS: Age, Time since onset of injury/illness/exacerbation, and 3+ comorbidities: BPD1, afib, CAD w/ chest pain, dysphagia, HTN, GAD, depression, migraine, NSTEMI, OSA, recurrent syncope  are also affecting patient's functional outcome.   REHAB POTENTIAL: Good  CLINICAL DECISION MAKING: Evolving/moderate complexity  EVALUATION COMPLEXITY: Moderate   GOALS:  SHORT TERM GOALS: Target date: 10/04/2023  Pt will demonstrate appropriate understanding and performance of initially prescribed HEP in order to facilitate improved independence with management of symptoms.  Baseline: HEP  Goal status: IN PROGRESS  2. Pt will report at least 25% improvement in overall pain levels over past week in order to facilitate improved tolerance to typical daily activities.   Baseline: 2-7/10  Goal status: IN PROGRESS   LONG TERM GOALS: Target date: 11/24/2023  Pt will improve MCID or greater on LEFS in order to demonstrate improved perception of function due to symptoms (MCID 9 pts) Baseline: LEFS 27/80 Goal status: IN PROGRESS L 10/25/23: 29/80 = 36.3%  2.  Pt will demonstrate 5/5 MMT BIL knees/ankles for improved functional strength.  Baseline: See MMT chart above Goal status: IN PROGRESS  3.  Pt will report no more than 4/10 knee pain on NPS with average work day in order to facilitate improved tolerance to work tasks.  Baseline: increased pain at end of work day, up to 7/10;  10/25/23: 8/10 Goal status: IN PROGRESS  4.  Pt will be able to perform 5xSTS in less than or equal to 9sec in order to demonstrate reduced fall risk and improved functional independence (MCID 5xSTS = 2.3 sec). Baseline: 11sec; 10/25/23: 9 sec Goal status: MET  5. Pt will report at least 50% decrease in overall pain levels in past week  in order to facilitate improved tolerance to basic ADLs/mobility.   Baseline: 2-7/10  Goal status: MET for R knee 50%; IN PROGRESS L KNEE   6. Pt will demonstrate appropriate performance of final prescribed HEP in order to facilitate improved self-management of symptoms post-discharge.   Baseline: HEP TBD  Goal status: IN PROGRESS   PLAN:  PT FREQUENCY: 1-2x/week  PT DURATION: 4 weeks  PLANNED INTERVENTIONS: 97164- PT Re-evaluation, 97110-Therapeutic exercises, 97530- Therapeutic activity, 97112- Neuromuscular re-education, 97535- Self Care, 16109- Manual therapy, [phone]- Gait training, Patient/Family education, Balance training, Stair training, Taping, Dry Needling, Joint mobilization, Cryotherapy, and Moist heat  PLAN FOR NEXT SESSION: Progress B LE strength and L knee ROM; squatting and SLB activities   Carlynn Herald, PTA  11/03/2023 1:59 PM

## 2023-11-05 ENCOUNTER — Other Ambulatory Visit: Payer: Self-pay | Admitting: Internal Medicine

## 2023-11-05 DIAGNOSIS — I25119 Atherosclerotic heart disease of native coronary artery with unspecified angina pectoris: Secondary | ICD-10-CM

## 2023-11-05 DIAGNOSIS — E785 Hyperlipidemia, unspecified: Secondary | ICD-10-CM

## 2023-11-07 ENCOUNTER — Other Ambulatory Visit (HOSPITAL_BASED_OUTPATIENT_CLINIC_OR_DEPARTMENT_OTHER): Payer: Self-pay

## 2023-11-07 DIAGNOSIS — I10 Essential (primary) hypertension: Secondary | ICD-10-CM

## 2023-11-07 MED ORDER — AMLODIPINE BESYLATE 2.5 MG PO TABS: 2.5000 mg | ORAL_TABLET | Freq: Every day | ORAL | 3 refills | Status: DC

## 2023-11-08 ENCOUNTER — Ambulatory Visit

## 2023-11-08 DIAGNOSIS — R262 Difficulty in walking, not elsewhere classified: Secondary | ICD-10-CM

## 2023-11-08 DIAGNOSIS — G8929 Other chronic pain: Secondary | ICD-10-CM | POA: Diagnosis not present

## 2023-11-08 DIAGNOSIS — M6281 Muscle weakness (generalized): Secondary | ICD-10-CM

## 2023-11-08 DIAGNOSIS — M25561 Pain in right knee: Secondary | ICD-10-CM | POA: Diagnosis not present

## 2023-11-08 DIAGNOSIS — M25562 Pain in left knee: Secondary | ICD-10-CM | POA: Diagnosis not present

## 2023-11-08 NOTE — Therapy (Signed)
 OUTPATIENT PHYSICAL THERAPY LOWER EXTREMITY TREATMENT   Patient Name: Lindsay [name] MRN: [id number] DOB:1955/08/30, 68 y.o., female Today's Date: 11/08/2023  END OF SESSION:  PT End of Session - 11/08/23 1021     Visit Number 10    Number of Visits 13    Date for PT Re-Evaluation 11/24/23    Authorization Type Medicare    Progress Note Due on Visit 17    PT Start Time 1021    PT Stop Time 1104    PT Time Calculation (min) 43 min    Activity Tolerance Patient tolerated treatment well    Behavior During Therapy [facility] for tasks assessed/performed            Past Medical History:  Diagnosis Date   Abnormal mammogram    Thick Tissue   Acquired trigger finger of right ring finger 04/20/2022   Arthritis    Bilateral wrist pain 04/20/2022   Bipolar 1 disorder    Cataract    OU   Cervical radiculopathy 10/14/2021   Chalazion of left lower eyelid 04/15/2022   Chest pain 02/11/2015   Chronic atrial fibrillation 09/08/2015   Colon polyp    Compressed cervical disc    Coronary artery disease involving native coronary artery with angina pectoris 03/24/2015   Dorsalgia 09/22/2018   Dysphagia 09/22/2018   Essential hypertension 03/03/2015   Generalized anxiety disorder    GERD without esophagitis 09/22/2018   Hematemesis 12/18/2019   Hepatic steatosis 04/22/2022   History of appendectomy 1963   Hypertensive retinopathy    OU   Irritable bowel syndrome without diarrhea 09/22/2018   Lactic acidosis 12/18/2019   LLQ pain 04/15/2022   Major depressive disorder    Migraine without status migrainosus, not intractable 09/22/2018   Mixed hyperlipidemia 05/07/2016   Non-ST elevation MI (NSTEMI) 07/26/2008   No cardiac catheterization at the time.   Obstructive sleep apnea    no CPAP use   Orthostasis    Osteoarthritis of carpometacarpal (CMC) joint of thumb 04/20/2022   Osteoarthritis of knee 09/22/2018   PAF (paroxysmal atrial fibrillation) 10/09/2018   Pain in joint of right  hip 07/03/2020   Pain in right knee 12/14/2018   Recurrent syncope    Rib pain 07/03/2020   Sciatica of right side 09/22/2018   Severe alcohol use disorder    Vitamin D deficiency    Past Surgical History:  Procedure Laterality Date   APPENDECTOMY     CARDIAC CATHETERIZATION N/A 03/25/2015   Procedure: Left Heart Cath and Coronary Angiography;  Surgeon: Marykay Lex, MD;  Location: [facility] INVASIVE CV LAB;  Service: Cardiovascular;  Laterality: N/A;   CORONARY STENT INTERVENTION N/A 10/09/2018   Procedure: CORONARY STENT INTERVENTION;  Surgeon: Corky Crafts, MD;  Location: [facility] INVASIVE CV LAB;  Service: Cardiovascular;  Laterality: N/A;   DILATATION & CURETTAGE/HYSTEROSCOPY WITH MYOSURE N/A 05/19/2016   Procedure: DILATATION & CURETTAGE/HYSTEROSCOPY WITH MYOSURE;  Surgeon: Geryl Rankins, MD;  Location: [facility];  Service: Gynecology;  Laterality: N/A;  Possible Myosure for polyps.   EYE SURGERY     LEFT HEART CATH AND CORONARY ANGIOGRAPHY N/A 10/09/2018   Procedure: LEFT HEART CATH AND CORONARY ANGIOGRAPHY;  Surgeon: Corky Crafts, MD;  Location: [facility] INVASIVE CV LAB;  Service: Cardiovascular;  Laterality: N/A;   MOUTH SURGERY     URETHRAL DILATION     Patient Active Problem List   Diagnosis Date Noted   B12 deficiency 10/24/2023   Seasonal allergies 10/21/2023   Gastroesophageal  reflux disease without esophagitis 10/21/2023   Epigastric pain 10/21/2023   Stress 02/02/2023   Insomnia 12/23/2022   Blood clotting disorder (HCC) 10/28/2022   Neck pain 10/27/2022   Acute thoracic back pain 10/27/2022   Lumbar radiculopathy 05/26/2022   Degeneration of lumbar intervertebral disc 05/26/2022   Major depressive disorder    Severe alcohol use disorder    Generalized anxiety disorder    Obstructive sleep apnea    Hepatic steatosis 04/22/2022   Osteoarthritis of carpometacarpal (CMC) joint of thumb 04/20/2022   Acquired trigger finger of right ring finger 04/20/2022   Bilateral  wrist pain 04/20/2022   Chalazion of left lower eyelid 04/15/2022   Bipolar 1 disorder (HCC)    Cervical radiculopathy 10/14/2021   Pain in joint of right hip 07/03/2020   Rib pain 07/03/2020   Recurrent syncope    Orthostasis    Lactic acidosis 12/18/2019   Hematemesis 12/18/2019   Pain in right knee 12/14/2018   PAF (paroxysmal atrial fibrillation) 10/09/2018   Dorsalgia 09/22/2018   Dysphagia 09/22/2018   GERD without esophagitis 09/22/2018   Irritable bowel syndrome without diarrhea 09/22/2018   Osteoarthritis of knee 09/22/2018   Polypharmacy 09/22/2018   Sciatica of right side 09/22/2018   Vitamin D deficiency 09/22/2018   Mixed hyperlipidemia 05/07/2016   Chronic atrial fibrillation (HCC) 09/08/2015   Coronary artery disease involving native coronary artery with angina pectoris 03/24/2015   Essential hypertension 03/03/2015   Chest pain 02/11/2015   Non-ST elevation MI (NSTEMI) (HCC) 07/26/2008    PCP: Chilton Si, MD  REFERRING PROVIDER: Elvin So, PA-C  REFERRING DIAG: [phone] (ICD-10-CM) - Pain in right knee  THERAPY DIAG:  Chronic pain of both knees  Difficulty in walking, not elsewhere classified  Muscle weakness (generalized)  Rationale for Evaluation and Treatment: Rehabilitation  ONSET DATE: 3 years ago  SUBJECTIVE:   SUBJECTIVE STATEMENT: Patient reports her L knee is really bothering her today; states she was working outside and was unable to get back up due to weakness in legs. Patient states the swelling and pain in knees fluctuates and plans to reach out to MD.   Pt reports ~3 year history of BIL knee pain, R>L. Does not endorse any specific injury, reports this was preceded by job with more time on feet. States symptoms slowly seem to be worsening, knee will occasionally buckle. Tends to fluctuate, oftentimes based on work activities. Has fluctuating swelling as well through lateral R knee. She also endorses history of neck/UE  pain, cramping in legs and arms for which she states she is in communication w/ providers. Also mentions a R shoulder subluxation a couple months ago, states she will be getting an MRI soon. Also reports seeing counselor weekly for mental health hx.   PERTINENT HISTORY: BPD1, afib, CAD w/ chest pain, dysphagia, HTN, GAD, depression, migraine, NSTEMI, OSA, recurrent syncope  PAIN:  Are you having pain: 0/10 Location/description: R>L knee, posterior and anterior Best-worst over past week: 2-7/10  - aggravating factors: walking, standing ,stairs - Easing factors: pain ointment, pain medication  PRECAUTIONS: cardiac hx     WEIGHT BEARING RESTRICTIONS: No  FALLS:  Has patient fallen in last 6 months? No  LIVING ENVIRONMENT: Uses cane PRN for balance Lives w/ cats; lives in split level, 2 sets of stairs - BIL rails, 6-8 vs 8-10 steps on each  OCCUPATION: cashier  PLOF: Independent - enjoys gardening, art  PATIENT GOALS: build strength/muscle, get back to art  NEXT MD VISIT:  TBD  OBJECTIVE:  Note: Objective measures were completed at Evaluation unless otherwise noted.  DIAGNOSTIC FINDINGS:  No recent imaging in chart - pt states she has had XR, endorses some degenerative changes   PATIENT SURVEYS:   09/20/23 LEFS 27 / 80 = 33.8 % 10/25/23 29/80  COGNITION: Overall cognitive status: Within functional limits for tasks assessed     SENSATION: NT  EDEMA:  Pt endorses fluctuating edema in lateral R knee   LOWER EXTREMITY ROM:      Right eval Left eval  Hip flexion    Hip extension    Hip internal rotation    Hip external rotation    Knee extension A: full   A: full  Knee flexion A: 119 deg   A: 124 deg   (Blank rows = not tested) (Key: WFL = within functional limits not formally assessed, * = concordant pain, s = stiffness/stretching sensation, NT = not tested)  Comments:    10/27/23: R knee flex 130 deg    L knee flex 110 deg feels tight on top of knee  LOWER  EXTREMITY MMT:    MMT Right eval Left eval  Hip flexion 4 4  Hip abduction (modified sitting) 5 5  Hip internal rotation    Hip external rotation    Knee flexion 4+ 5  Knee extension 4+ 5  Ankle dorsiflexion 4+ 5   (Blank rows = not tested) (Key: WFL = within functional limits not formally assessed, * = concordant pain, s = stiffness/stretching sensation, NT = not tested)  Comments:    10/27/23:  hip flex R 4/5, L 4+5; R knee ext 5-/5, flex 4+/5,    LOWER EXTREMITY SPECIAL TESTS:  Deferred given time constraints  FUNCTIONAL TESTS:  5xSTS: 11.69sec w/ mild pain  TUG: 10.27sec w/o AD, antalgic gait  GAIT: Distance walked: within clinic Assistive device utilized: None Level of assistance: Complete Independence Comments: antalgic gait R>L, reduced gait speed/cadence                                                                                                                              TREATMENT DATE:  [facility] Adult PT Treatment:                                                DATE: 11/08/2023 Therapeutic Exercise: Supine: Dynamic HS stretch + ankle pumps ITB stretch w/strap  Heel slides with orange PB STS with no HHA 2x10 Neuromuscular re-ed: Small range SLR 10x5" S/L bent knee hip abd + blue TB 2x10x5"    [facility] Adult PT Treatment:  DATE: 11/03/2023 Therapeutic Exercise: NuStep L6 x 5 min Straight leg bridges with orange PB 2x15 Leg press: DL 16# [id number] --> 04.5# x 10 Neuromuscular re-ed: Slow single leg step up on 6" step --> fingertip touch for support (B) Front heel tap down on 4" step + HHAx2 Side Lying: (pelvic stabilization & postural awareness) Bent knee hip abd (hydrants) + GTB x20 (B) Clamshells + GTB x20 (B) Straight leg hip abd + GTB x10    [facility] Adult PT Treatment:                                                DATE: 11/01/2023 Therapeutic Exercise: NuStep L7-6 x 5 min Bridge with feet on orange PB  3x10 Standing knee flexion 3#AW x 15 (B) Neuromuscular re-ed: Side Lying: (pelvic stabilization & postural awareness) Bent knee hip abd (hydrants) + GTB x15 (B) Clamshells + blue TB x20 (B) Therapeutic Activity: Standing squats with seat tap Staggered stance STS x10 each leg    PATIENT EDUCATION:  Education details: Updated HEP Person educated: Patient Education method: Explanation, Demonstration, Tactile cues, Verbal cues Education comprehension: verbalized understanding, returned demonstration, verbal cues required, tactile cues required, and needs further education    HOME EXERCISE PROGRAM: Access Code: [email or web address] URL: [email or web address] Date: 11/01/2023 Prepared by: Carlynn Herald  Exercises - Supine Knee Extension Strengthening  - 1 x daily - 7 x weekly - 2 sets - 15 reps - Sidelying Hip Flexion  - 1 x daily - 3 x weekly - 2 sets - 15 reps - Squat with Chair Touch  - 1 x daily - 3 x weekly - 1-2 sets - 10 reps - Seated Long Arc Quad  - 1 x daily - 7 x weekly - 22 sets - 15 reps - Supine Bridge  - 1 x daily - 7 x weekly - 1-3 sets - 10 reps - 10 sec hold - Seated Hip Abduction with Resistance  - 1 x daily - 3 x weekly - 2 sets - 10 reps - Small Range Straight Leg Raise  - 1 x daily - 7 x weekly - 3 sets - 10 reps - Wall Quarter Squat  - 1 x daily - 7 x weekly - 3 sets - 10 reps - 5 sec hold - Sidelying Bent Knee Lift at 45 Degrees  - 1 x daily - 7 x weekly - 3 sets - 10 reps - Clam with Resistance  - 1 x daily - 7 x weekly - 3 sets - 10 reps   ASSESSMENT:  CLINICAL IMPRESSION: Patient continues to have fluctuating pain and edema in bilateral knees (L>R). Gentle knee ROM exercises and quad activation/isometrics improved knee mobility and flexion AROM. Glute strengthening continued to progress LE stability and functional. Patient reported decreased knee pain and improved mobility by end of session.   OBJECTIVE IMPAIRMENTS: Abnormal gait, decreased activity  tolerance, decreased balance, decreased endurance, decreased mobility, difficulty walking, decreased ROM, decreased strength, and pain.   ACTIVITY LIMITATIONS: carrying, lifting, bending, standing, squatting, sleeping, stairs, transfers, and locomotion level  PARTICIPATION LIMITATIONS: meal prep, cleaning, laundry, community activity, and occupation  PERSONAL FACTORS: Age, Time since onset of injury/illness/exacerbation, and 3+ comorbidities: BPD1, afib, CAD w/ chest pain, dysphagia, HTN, GAD, depression, migraine, NSTEMI, OSA, recurrent syncope  are also affecting patient's functional outcome.   REHAB POTENTIAL: Good  CLINICAL  DECISION MAKING: Evolving/moderate complexity  EVALUATION COMPLEXITY: Moderate   GOALS:  SHORT TERM GOALS: Target date: 10/04/2023  Pt will demonstrate appropriate understanding and performance of initially prescribed HEP in order to facilitate improved independence with management of symptoms.  Baseline: HEP  Goal status: IN PROGRESS  2. Pt will report at least 25% improvement in overall pain levels over past week in order to facilitate improved tolerance to typical daily activities.   Baseline: 2-7/10  Goal status: IN PROGRESS   LONG TERM GOALS: Target date: 11/24/2023  Pt will improve MCID or greater on LEFS in order to demonstrate improved perception of function due to symptoms (MCID 9 pts) Baseline: LEFS 27/80 10/25/23: 29/80 = 36.3% Goal status: IN PROGRESS L   2.  Pt will demonstrate 5/5 MMT BIL knees/ankles for improved functional strength.  Baseline: See MMT chart above Goal status: IN PROGRESS  3.  Pt will report no more than 4/10 knee pain on NPS with average work day in order to facilitate improved tolerance to work tasks.  Baseline: increased pain at end of work day, up to 7/10;  10/25/23: 8/10 Goal status: IN PROGRESS  4.  Pt will be able to perform 5xSTS in less than or equal to 9sec in order to demonstrate reduced fall risk and improved  functional independence (MCID 5xSTS = 2.3 sec). Baseline: 11sec; 10/25/23: 9 sec Goal status: MET  5. Pt will report at least 50% decrease in overall pain levels in past week in order to facilitate improved tolerance to basic ADLs/mobility.   Baseline: 2-7/10  Goal status: MET for R knee 50%; IN PROGRESS L KNEE   6. Pt will demonstrate appropriate performance of final prescribed HEP in order to facilitate improved self-management of symptoms post-discharge.   Baseline: HEP TBD  Goal status: IN PROGRESS   PLAN:  PT FREQUENCY: 1-2x/week  PT DURATION: 4 weeks  PLANNED INTERVENTIONS: 97164- PT Re-evaluation, 97110-Therapeutic exercises, 97530- Therapeutic activity, 97112- Neuromuscular re-education, 97535- Self Care, [id number]- Manual therapy, [id number]- Gait training, Patient/Family education, Balance training, Stair training, Taping, Dry Needling, Joint mobilization, Cryotherapy, and Moist heat  PLAN FOR NEXT SESSION: Progress B LE strength and L knee ROM; squatting and SLB activities as tolerated    Sims Duck, PTA  11/08/2023 11:05 AM

## 2023-11-10 ENCOUNTER — Ambulatory Visit

## 2023-11-10 DIAGNOSIS — M25561 Pain in right knee: Secondary | ICD-10-CM | POA: Diagnosis not present

## 2023-11-10 DIAGNOSIS — G8929 Other chronic pain: Secondary | ICD-10-CM | POA: Diagnosis not present

## 2023-11-10 DIAGNOSIS — R262 Difficulty in walking, not elsewhere classified: Secondary | ICD-10-CM

## 2023-11-10 DIAGNOSIS — M6281 Muscle weakness (generalized): Secondary | ICD-10-CM | POA: Diagnosis not present

## 2023-11-10 DIAGNOSIS — M25562 Pain in left knee: Secondary | ICD-10-CM | POA: Diagnosis not present

## 2023-11-10 NOTE — Therapy (Signed)
 OUTPATIENT PHYSICAL THERAPY LOWER EXTREMITY TREATMENT   Patient Name: Lindsay [name] MRN: [id number] DOB:Jul 24, 1956, 68 y.o., female Today's Date: 11/10/2023  END OF SESSION:  PT End of Session - 11/10/23 1018     Visit Number 11    Number of Visits 13    Date for PT Re-Evaluation 11/24/23    Authorization Type Medicare    Progress Note Due on Visit 17    PT Start Time 1016    PT Stop Time 1059    PT Time Calculation (min) 43 min    Activity Tolerance Patient tolerated treatment well    Behavior During Therapy [facility] for tasks assessed/performed            Past Medical History:  Diagnosis Date   Abnormal mammogram    Thick Tissue   Acquired trigger finger of right ring finger 04/20/2022   Arthritis    Bilateral wrist pain 04/20/2022   Bipolar 1 disorder    Cataract    OU   Cervical radiculopathy 10/14/2021   Chalazion of left lower eyelid 04/15/2022   Chest pain 02/11/2015   Chronic atrial fibrillation 09/08/2015   Colon polyp    Compressed cervical disc    Coronary artery disease involving native coronary artery with angina pectoris 03/24/2015   Dorsalgia 09/22/2018   Dysphagia 09/22/2018   Essential hypertension 03/03/2015   Generalized anxiety disorder    GERD without esophagitis 09/22/2018   Hematemesis 12/18/2019   Hepatic steatosis 04/22/2022   History of appendectomy 1963   Hypertensive retinopathy    OU   Irritable bowel syndrome without diarrhea 09/22/2018   Lactic acidosis 12/18/2019   LLQ pain 04/15/2022   Major depressive disorder    Migraine without status migrainosus, not intractable 09/22/2018   Mixed hyperlipidemia 05/07/2016   Non-ST elevation MI (NSTEMI) 07/26/2008   No cardiac catheterization at the time.   Obstructive sleep apnea    no CPAP use   Orthostasis    Osteoarthritis of carpometacarpal (CMC) joint of thumb 04/20/2022   Osteoarthritis of knee 09/22/2018   PAF (paroxysmal atrial fibrillation) 10/09/2018   Pain in joint of right  hip 07/03/2020   Pain in right knee 12/14/2018   Recurrent syncope    Rib pain 07/03/2020   Sciatica of right side 09/22/2018   Severe alcohol use disorder    Vitamin D deficiency    Past Surgical History:  Procedure Laterality Date   APPENDECTOMY     CARDIAC CATHETERIZATION N/A 03/25/2015   Procedure: Left Heart Cath and Coronary Angiography;  Surgeon: Marykay Lex, MD;  Location: [facility] INVASIVE CV LAB;  Service: Cardiovascular;  Laterality: N/A;   CORONARY STENT INTERVENTION N/A 10/09/2018   Procedure: CORONARY STENT INTERVENTION;  Surgeon: Corky Crafts, MD;  Location: [facility] INVASIVE CV LAB;  Service: Cardiovascular;  Laterality: N/A;   DILATATION & CURETTAGE/HYSTEROSCOPY WITH MYOSURE N/A 05/19/2016   Procedure: DILATATION & CURETTAGE/HYSTEROSCOPY WITH MYOSURE;  Surgeon: Geryl Rankins, MD;  Location: [facility];  Service: Gynecology;  Laterality: N/A;  Possible Myosure for polyps.   EYE SURGERY     LEFT HEART CATH AND CORONARY ANGIOGRAPHY N/A 10/09/2018   Procedure: LEFT HEART CATH AND CORONARY ANGIOGRAPHY;  Surgeon: Corky Crafts, MD;  Location: [facility] INVASIVE CV LAB;  Service: Cardiovascular;  Laterality: N/A;   MOUTH SURGERY     URETHRAL DILATION     Patient Active Problem List   Diagnosis Date Noted   B12 deficiency 10/24/2023   Seasonal allergies 10/21/2023   Gastroesophageal  reflux disease without esophagitis 10/21/2023   Epigastric pain 10/21/2023   Stress 02/02/2023   Insomnia 12/23/2022   Blood clotting disorder (HCC) 10/28/2022   Neck pain 10/27/2022   Acute thoracic back pain 10/27/2022   Lumbar radiculopathy 05/26/2022   Degeneration of lumbar intervertebral disc 05/26/2022   Major depressive disorder    Severe alcohol use disorder    Generalized anxiety disorder    Obstructive sleep apnea    Hepatic steatosis 04/22/2022   Osteoarthritis of carpometacarpal (CMC) joint of thumb 04/20/2022   Acquired trigger finger of right ring finger 04/20/2022   Bilateral  wrist pain 04/20/2022   Chalazion of left lower eyelid 04/15/2022   Bipolar 1 disorder (HCC)    Cervical radiculopathy 10/14/2021   Pain in joint of right hip 07/03/2020   Rib pain 07/03/2020   Recurrent syncope    Orthostasis    Lactic acidosis 12/18/2019   Hematemesis 12/18/2019   Pain in right knee 12/14/2018   PAF (paroxysmal atrial fibrillation) 10/09/2018   Dorsalgia 09/22/2018   Dysphagia 09/22/2018   GERD without esophagitis 09/22/2018   Irritable bowel syndrome without diarrhea 09/22/2018   Osteoarthritis of knee 09/22/2018   Polypharmacy 09/22/2018   Sciatica of right side 09/22/2018   Vitamin D deficiency 09/22/2018   Mixed hyperlipidemia 05/07/2016   Chronic atrial fibrillation (HCC) 09/08/2015   Coronary artery disease involving native coronary artery with angina pectoris 03/24/2015   Essential hypertension 03/03/2015   Chest pain 02/11/2015   Non-ST elevation MI (NSTEMI) (HCC) 07/26/2008    PCP: Chilton Si, MD  REFERRING PROVIDER: Elvin So, PA-C  REFERRING DIAG: [phone] (ICD-10-CM) - Pain in right knee  THERAPY DIAG:  Chronic pain of both knees  Difficulty in walking, not elsewhere classified  Muscle weakness (generalized)  Rationale for Evaluation and Treatment: Rehabilitation  ONSET DATE: 3 years ago  SUBJECTIVE:   SUBJECTIVE STATEMENT: Patient reports her R knee is buckling occasionally while standing at work; states R knee is more swollen than L today.  Pt reports ~3 year history of BIL knee pain, R>L. Does not endorse any specific injury, reports this was preceded by job with more time on feet. States symptoms slowly seem to be worsening, knee will occasionally buckle. Tends to fluctuate, oftentimes based on work activities. Has fluctuating swelling as well through lateral R knee. She also endorses history of neck/UE pain, cramping in legs and arms for which she states she is in communication w/ providers. Also mentions a R  shoulder subluxation a couple months ago, states she will be getting an MRI soon. Also reports seeing counselor weekly for mental health hx.   PERTINENT HISTORY: BPD1, afib, CAD w/ chest pain, dysphagia, HTN, GAD, depression, migraine, NSTEMI, OSA, recurrent syncope  PAIN:  Are you having pain: 0/10 Location/description: R>L knee, posterior and anterior Best-worst over past week: 2-7/10  - aggravating factors: walking, standing ,stairs - Easing factors: pain ointment, pain medication  PRECAUTIONS: cardiac hx     WEIGHT BEARING RESTRICTIONS: No  FALLS:  Has patient fallen in last 6 months? No  LIVING ENVIRONMENT: Uses cane PRN for balance Lives w/ cats; lives in split level, 2 sets of stairs - BIL rails, 6-8 vs 8-10 steps on each  OCCUPATION: cashier  PLOF: Independent - enjoys gardening, art  PATIENT GOALS: build strength/muscle, get back to art  NEXT MD VISIT: TBD  OBJECTIVE:  Note: Objective measures were completed at Evaluation unless otherwise noted.  DIAGNOSTIC FINDINGS:  No recent imaging in chart -  pt states she has had XR, endorses some degenerative changes   PATIENT SURVEYS:   09/20/23 LEFS 27 / 80 = 33.8 % 10/25/23 29/80  COGNITION: Overall cognitive status: Within functional limits for tasks assessed     SENSATION: NT  EDEMA:  Pt endorses fluctuating edema in lateral R knee   LOWER EXTREMITY ROM:      Right eval Left eval  Hip flexion    Hip extension    Hip internal rotation    Hip external rotation    Knee extension A: full   A: full  Knee flexion A: 119 deg   A: 124 deg   (Blank rows = not tested) (Key: WFL = within functional limits not formally assessed, * = concordant pain, s = stiffness/stretching sensation, NT = not tested)  Comments:    10/27/23: R knee flex 130 deg    L knee flex 110 deg feels tight on top of knee  LOWER EXTREMITY MMT:    MMT Right eval Left eval  Hip flexion 4 4  Hip abduction (modified sitting) 5 5   Hip internal rotation    Hip external rotation    Knee flexion 4+ 5  Knee extension 4+ 5  Ankle dorsiflexion 4+ 5   (Blank rows = not tested) (Key: WFL = within functional limits not formally assessed, * = concordant pain, s = stiffness/stretching sensation, NT = not tested)  Comments:    10/27/23:  hip flex R 4/5, L 4+5; R knee ext 5-/5, flex 4+/5,    LOWER EXTREMITY SPECIAL TESTS:  Deferred given time constraints  FUNCTIONAL TESTS:  5xSTS: 11.69sec w/ mild pain  TUG: 10.27sec w/o AD, antalgic gait  GAIT: Distance walked: within clinic Assistive device utilized: None Level of assistance: Complete Independence Comments: antalgic gait R>L, reduced gait speed/cadence                                                                                                                              TREATMENT DATE:  [facility] Adult PT Treatment:                                                DATE: 11/10/2023 Therapeutic Exercise: NuStep L6 x 6 min LAQ + 4#AW x15 Straight leg bridges on orange PB 2x15 Neuromuscular re-ed: SLR + 1#AW 15x5" Side Lying: Bent knee hip abd + GTB 15x3" Straight leg hip abd + GTB 15x3" Small leg circles in hip abd + GTB CW/CCW x5 each   [facility] Adult PT Treatment:                                                DATE: 11/08/2023 Therapeutic  Exercise: Supine: Dynamic HS stretch + ankle pumps ITB stretch w/strap  Heel slides with orange PB STS with no HHA 2x10 Neuromuscular re-ed: Small range SLR 10x5" S/L bent knee hip abd + blue TB 2x10x5"   [facility] Adult PT Treatment:                                                DATE: 11/03/2023 Therapeutic Exercise: NuStep L6 x 5 min Straight leg bridges with orange PB 2x15 Leg press: DL 16# 1W96 --> 04.5# x 10 Neuromuscular re-ed: Slow single leg step up on 6" step --> fingertip touch for support (B) Front heel tap down on 4" step + HHAx2 Side Lying: (pelvic stabilization & postural awareness) Bent knee hip abd  (hydrants) + GTB x20 (B) Clamshells + GTB x20 (B) Straight leg hip abd + GTB x10   PATIENT EDUCATION:  Education details: Updated HEP Person educated: Patient Education method: Explanation, Demonstration, Tactile cues, Verbal cues Education comprehension: verbalized understanding, returned demonstration, verbal cues required, tactile cues required, and needs further education    HOME EXERCISE PROGRAM: Access Code: [email or web address] URL: [email or web address] Date: 11/10/2023 Prepared by: Carlynn Herald  Exercises - Supine Knee Extension Strengthening  - 1 x daily - 7 x weekly - 2 sets - 15 reps - Squat with Chair Touch  - 1 x daily - 3 x weekly - 1-2 sets - 10 reps - Seated Long Arc Quad  - 1 x daily - 7 x weekly - 22 sets - 15 reps - Supine Bridge  - 1 x daily - 7 x weekly - 1-3 sets - 10 reps - 10 sec hold - Seated Hip Abduction with Resistance  - 1 x daily - 3 x weekly - 2 sets - 10 reps - Small Range Straight Leg Raise  - 1 x daily - 7 x weekly - 3 sets - 10 reps - Wall Quarter Squat  - 1 x daily - 7 x weekly - 3 sets - 10 reps - 5 sec hold - Sidelying Bent Knee Lift at 45 Degrees  - 1 x daily - 7 x weekly - 3 sets - 10 reps - Clam with Resistance  - 1 x daily - 7 x weekly - 3 sets - 10 reps - Sidelying Hip Abduction with Resistance at Thighs  - 1 x daily - 7 x weekly - 3 sets - 10 reps - Seated Knee Extension with Resistance  - 1 x daily - 7 x weekly - 3 sets - 10 reps   ASSESSMENT:  CLINICAL IMPRESSION: Initial knee pain decreased with warm-up on NuStep. Quad strengthening continued; noted fatigue greater on R as compared to L during resisted LAQ exercise. Hip strengthening challenged with resisted hip abd variations; greater fatigue noted on R as compared to L. Patient reported signficant decrease in bilateral knee pain by end session.   OBJECTIVE IMPAIRMENTS: Abnormal gait, decreased activity tolerance, decreased balance, decreased endurance, decreased mobility,  difficulty walking, decreased ROM, decreased strength, and pain.   ACTIVITY LIMITATIONS: carrying, lifting, bending, standing, squatting, sleeping, stairs, transfers, and locomotion level  PARTICIPATION LIMITATIONS: meal prep, cleaning, laundry, community activity, and occupation  PERSONAL FACTORS: Age, Time since onset of injury/illness/exacerbation, and 3+ comorbidities: BPD1, afib, CAD w/ chest pain, dysphagia, HTN, GAD, depression, migraine, NSTEMI, OSA, recurrent syncope  are also affecting patient's functional outcome.  REHAB POTENTIAL: Good  CLINICAL DECISION MAKING: Evolving/moderate complexity  EVALUATION COMPLEXITY: Moderate   GOALS:  SHORT TERM GOALS: Target date: 10/04/2023  Pt will demonstrate appropriate understanding and performance of initially prescribed HEP in order to facilitate improved independence with management of symptoms.  Baseline: HEP  Goal status: IN PROGRESS  2. Pt will report at least 25% improvement in overall pain levels over past week in order to facilitate improved tolerance to typical daily activities.   Baseline: 2-7/10  Goal status: IN PROGRESS   LONG TERM GOALS: Target date: 11/24/2023  Pt will improve MCID or greater on LEFS in order to demonstrate improved perception of function due to symptoms (MCID 9 pts) Baseline: LEFS 27/80 10/25/23: 29/80 = 36.3% Goal status: IN PROGRESS L   2.  Pt will demonstrate 5/5 MMT BIL knees/ankles for improved functional strength.  Baseline: See MMT chart above Goal status: IN PROGRESS  3.  Pt will report no more than 4/10 knee pain on NPS with average work day in order to facilitate improved tolerance to work tasks.  Baseline: increased pain at end of work day, up to 7/10;  10/25/23: 8/10 Goal status: IN PROGRESS  4.  Pt will be able to perform 5xSTS in less than or equal to 9sec in order to demonstrate reduced fall risk and improved functional independence (MCID 5xSTS = 2.3 sec). Baseline: 11sec; 10/25/23:  9 sec Goal status: MET  5. Pt will report at least 50% decrease in overall pain levels in past week in order to facilitate improved tolerance to basic ADLs/mobility.   Baseline: 2-7/10  Goal status: MET for R knee 50%; IN PROGRESS L KNEE   6. Pt will demonstrate appropriate performance of final prescribed HEP in order to facilitate improved self-management of symptoms post-discharge.   Baseline: HEP TBD  Goal status: IN PROGRESS   PLAN:  PT FREQUENCY: 1-2x/week  PT DURATION: 4 weeks  PLANNED INTERVENTIONS: 97164- PT Re-evaluation, 97110-Therapeutic exercises, 97530- Therapeutic activity, 97112- Neuromuscular re-education, 97535- Self Care, 16109- Manual therapy, [phone]- Gait training, Patient/Family education, Balance training, Stair training, Taping, Dry Needling, Joint mobilization, Cryotherapy, and Moist heat  PLAN FOR NEXT SESSION: Progress B LE strength and L knee ROM; squatting and SLB activities as tolerated    Sims Duck, PTA  11/10/2023 11:01 AM

## 2023-11-15 ENCOUNTER — Ambulatory Visit: Admitting: Physical Therapy

## 2023-11-15 ENCOUNTER — Encounter: Payer: Self-pay | Admitting: Physical Therapy

## 2023-11-15 DIAGNOSIS — M6281 Muscle weakness (generalized): Secondary | ICD-10-CM | POA: Diagnosis not present

## 2023-11-15 DIAGNOSIS — R262 Difficulty in walking, not elsewhere classified: Secondary | ICD-10-CM

## 2023-11-15 DIAGNOSIS — G8929 Other chronic pain: Secondary | ICD-10-CM | POA: Diagnosis not present

## 2023-11-15 DIAGNOSIS — M25561 Pain in right knee: Secondary | ICD-10-CM | POA: Diagnosis not present

## 2023-11-15 DIAGNOSIS — M25562 Pain in left knee: Secondary | ICD-10-CM | POA: Diagnosis not present

## 2023-11-15 NOTE — Therapy (Signed)
 OUTPATIENT PHYSICAL THERAPY LOWER EXTREMITY TREATMENT   Patient Name: Lindsay [name] MRN: [id number] DOB:06-08-1956, 68 y.o., female Today's Date: 11/15/2023  END OF SESSION:  PT End of Session - 11/15/23 0917     Visit Number 12    Number of Visits 13    Date for PT Re-Evaluation 11/24/23    Authorization Type Medicare    Progress Note Due on Visit 17    PT Start Time 0917    PT Stop Time 1002    PT Time Calculation (min) 45 min    Activity Tolerance Patient tolerated treatment well    Behavior During Therapy [facility] for tasks assessed/performed             Past Medical History:  Diagnosis Date   Abnormal mammogram    Thick Tissue   Acquired trigger finger of right ring finger 04/20/2022   Arthritis    Bilateral wrist pain 04/20/2022   Bipolar 1 disorder    Cataract    OU   Cervical radiculopathy 10/14/2021   Chalazion of left lower eyelid 04/15/2022   Chest pain 02/11/2015   Chronic atrial fibrillation 09/08/2015   Colon polyp    Compressed cervical disc    Coronary artery disease involving native coronary artery with angina pectoris 03/24/2015   Dorsalgia 09/22/2018   Dysphagia 09/22/2018   Essential hypertension 03/03/2015   Generalized anxiety disorder    GERD without esophagitis 09/22/2018   Hematemesis 12/18/2019   Hepatic steatosis 04/22/2022   History of appendectomy 1963   Hypertensive retinopathy    OU   Irritable bowel syndrome without diarrhea 09/22/2018   Lactic acidosis 12/18/2019   LLQ pain 04/15/2022   Major depressive disorder    Migraine without status migrainosus, not intractable 09/22/2018   Mixed hyperlipidemia 05/07/2016   Non-ST elevation MI (NSTEMI) 07/26/2008   No cardiac catheterization at the time.   Obstructive sleep apnea    no CPAP use   Orthostasis    Osteoarthritis of carpometacarpal (CMC) joint of thumb 04/20/2022   Osteoarthritis of knee 09/22/2018   PAF (paroxysmal atrial fibrillation) 10/09/2018   Pain in joint of right  hip 07/03/2020   Pain in right knee 12/14/2018   Recurrent syncope    Rib pain 07/03/2020   Sciatica of right side 09/22/2018   Severe alcohol use disorder    Vitamin D  deficiency    Past Surgical History:  Procedure Laterality Date   APPENDECTOMY     CARDIAC CATHETERIZATION N/A 03/25/2015   Procedure: Left Heart Cath and Coronary Angiography;  Surgeon: Arleen Lacer, MD;  Location: [facility] INVASIVE CV LAB;  Service: Cardiovascular;  Laterality: N/A;   CORONARY STENT INTERVENTION N/A 10/09/2018   Procedure: CORONARY STENT INTERVENTION;  Surgeon: Lucendia Rusk, MD;  Location: [facility] INVASIVE CV LAB;  Service: Cardiovascular;  Laterality: N/A;   DILATATION & CURETTAGE/HYSTEROSCOPY WITH MYOSURE N/A 05/19/2016   Procedure: DILATATION & CURETTAGE/HYSTEROSCOPY WITH MYOSURE;  Surgeon: Johnn Najjar, MD;  Location: [facility];  Service: Gynecology;  Laterality: N/A;  Possible Myosure for polyps.   EYE SURGERY     LEFT HEART CATH AND CORONARY ANGIOGRAPHY N/A 10/09/2018   Procedure: LEFT HEART CATH AND CORONARY ANGIOGRAPHY;  Surgeon: Lucendia Rusk, MD;  Location: [facility] INVASIVE CV LAB;  Service: Cardiovascular;  Laterality: N/A;   MOUTH SURGERY     URETHRAL DILATION     Patient Active Problem List   Diagnosis Date Noted   B12 deficiency 10/24/2023   Seasonal allergies 10/21/2023  Gastroesophageal reflux disease without esophagitis 10/21/2023   Epigastric pain 10/21/2023   Stress 02/02/2023   Insomnia 12/23/2022   Blood clotting disorder (HCC) 10/28/2022   Neck pain 10/27/2022   Acute thoracic back pain 10/27/2022   Lumbar radiculopathy 05/26/2022   Degeneration of lumbar intervertebral disc 05/26/2022   Major depressive disorder    Severe alcohol use disorder    Generalized anxiety disorder    Obstructive sleep apnea    Hepatic steatosis 04/22/2022   Osteoarthritis of carpometacarpal (CMC) joint of thumb 04/20/2022   Acquired trigger finger of right ring finger 04/20/2022   Bilateral  wrist pain 04/20/2022   Chalazion of left lower eyelid 04/15/2022   Bipolar 1 disorder (HCC)    Cervical radiculopathy 10/14/2021   Pain in joint of right hip 07/03/2020   Rib pain 07/03/2020   Recurrent syncope    Orthostasis    Lactic acidosis 12/18/2019   Hematemesis 12/18/2019   Pain in right knee 12/14/2018   PAF (paroxysmal atrial fibrillation) 10/09/2018   Dorsalgia 09/22/2018   Dysphagia 09/22/2018   GERD without esophagitis 09/22/2018   Irritable bowel syndrome without diarrhea 09/22/2018   Osteoarthritis of knee 09/22/2018   Polypharmacy 09/22/2018   Sciatica of right side 09/22/2018   Vitamin D  deficiency 09/22/2018   Mixed hyperlipidemia 05/07/2016   Chronic atrial fibrillation (HCC) 09/08/2015   Coronary artery disease involving native coronary artery with angina pectoris 03/24/2015   Essential hypertension 03/03/2015   Chest pain 02/11/2015   Non-ST elevation MI (NSTEMI) (HCC) 07/26/2008    PCP: Maudine Sos, MD  REFERRING PROVIDER: Taras Farber, PA-C  REFERRING DIAG: [phone] (ICD-10-CM) - Pain in right knee  THERAPY DIAG:  Chronic pain of both knees  Difficulty in walking, not elsewhere classified  Muscle weakness (generalized)  Rationale for Evaluation and Treatment: Rehabilitation  ONSET DATE: 3 years ago  SUBJECTIVE:   SUBJECTIVE STATEMENT: I feel like I am on an upswing overall with my knee pain. I cannot wear my knee brace and was hoping to get my knee wrapped again to help stabilize it. I can't find the wraps from before, but it really helped. Just popping and stiffness today.  Pt reports ~3 year history of BIL knee pain, R>L. Does not endorse any specific injury, reports this was preceded by job with more time on feet. States symptoms slowly seem to be worsening, knee will occasionally buckle. Tends to fluctuate, oftentimes based on work activities. Has fluctuating swelling as well through lateral R knee. She also endorses history of  neck/UE pain, cramping in legs and arms for which she states she is in communication w/ providers. Also mentions a R shoulder subluxation a couple months ago, states she will be getting an MRI soon. Also reports seeing counselor weekly for mental health hx.   PERTINENT HISTORY: BPD1, afib, CAD w/ chest pain, dysphagia, HTN, GAD, depression, migraine, NSTEMI, OSA, recurrent syncope  PAIN:  Are you having pain: 0/10 Location/description: R>L knee, posterior and anterior Best-worst over past week: 2-7/10  - aggravating factors: walking, standing ,stairs - Easing factors: pain ointment, pain medication  PRECAUTIONS: cardiac hx     WEIGHT BEARING RESTRICTIONS: No  FALLS:  Has patient fallen in last 6 months? No  LIVING ENVIRONMENT: Uses cane PRN for balance Lives w/ cats; lives in split level, 2 sets of stairs - BIL rails, 6-8 vs 8-10 steps on each  OCCUPATION: cashier  PLOF: Independent - enjoys gardening, art  PATIENT GOALS: build strength/muscle, get back to  art  NEXT MD VISIT: TBD  OBJECTIVE:  Note: Objective measures were completed at Evaluation unless otherwise noted.  DIAGNOSTIC FINDINGS:  No recent imaging in chart - pt states she has had XR, endorses some degenerative changes   PATIENT SURVEYS:   09/20/23 LEFS 27 / 80 = 33.8 % 10/25/23 29/80  COGNITION: Overall cognitive status: Within functional limits for tasks assessed     SENSATION: NT  EDEMA:  Pt endorses fluctuating edema in lateral R knee   LOWER EXTREMITY ROM:      Right eval Left eval  Hip flexion    Hip extension    Hip internal rotation    Hip external rotation    Knee extension A: full   A: full  Knee flexion A: 119 deg   A: 124 deg   (Blank rows = not tested) (Key: WFL = within functional limits not formally assessed, * = concordant pain, s = stiffness/stretching sensation, NT = not tested)  Comments:    10/27/23: R knee flex 130 deg    L knee flex 110 deg feels tight on top of  knee  LOWER EXTREMITY MMT:    MMT Right eval Left eval  Hip flexion 4 4  Hip abduction (modified sitting) 5 5  Hip internal rotation    Hip external rotation    Knee flexion 4+ 5  Knee extension 4+ 5  Ankle dorsiflexion 4+ 5   (Blank rows = not tested) (Key: WFL = within functional limits not formally assessed, * = concordant pain, s = stiffness/stretching sensation, NT = not tested)  Comments:    10/27/23:  hip flex R 4/5, L 4+5; R knee ext 5-/5, flex 4+/5,    LOWER EXTREMITY SPECIAL TESTS:  Deferred given time constraints  FUNCTIONAL TESTS:  5xSTS: 11.69sec w/ mild pain  TUG: 10.27sec w/o AD, antalgic gait  GAIT: Distance walked: within clinic Assistive device utilized: None Level of assistance: Complete Independence Comments: antalgic gait R>L, reduced gait speed/cadence                                                                                                                              TREATMENT DATE:   [facility] Adult PT Treatment:                                                DATE: 11/15/2023 Therapeutic Exercise: NuStep L6 x 7.5 min PT present to discuss status LAQ + 4#AW x15 Seated hip flexion 4# x 15 Straight leg bridges on orange PB 2x15 Neuromuscular re-ed: SLR + 1#AW 15x5" Therapeutic Activities: Worked on kneeling and standing from kneeling with and without use of a table (or object) for UE support onto and off of foam pad to improve ability to garden independently. Discussed use of KT tape for support of knee  as well as Ace Bandages which pt has used in the past.   [facility] Adult PT Treatment:                                                DATE: 11/10/2023 Therapeutic Exercise: NuStep L6 x 6 min LAQ + 4#AW x15 Straight leg bridges on orange PB 2x15 Neuromuscular re-ed: SLR + 1#AW 15x5" Side Lying: Bent knee hip abd + GTB 15x3" Straight leg hip abd + GTB 15x3" Small leg circles in hip abd + GTB CW/CCW x5 each   [facility] Adult PT Treatment:                                                 DATE: 11/08/2023 Therapeutic Exercise: Supine: Dynamic HS stretch + ankle pumps ITB stretch w/strap  Heel slides with orange PB STS with no HHA 2x10 Neuromuscular re-ed: Small range SLR 10x5" S/L bent knee hip abd + blue TB 2x10x5"   [facility] Adult PT Treatment:                                                DATE: 11/03/2023 Therapeutic Exercise: NuStep L6 x 5 min Straight leg bridges with orange PB 2x15 Leg press: DL 29# 5A21 --> 30.8# x 10 Neuromuscular re-ed: Slow single leg step up on 6" step --> fingertip touch for support (B) Front heel tap down on 4" step + HHAx2 Side Lying: (pelvic stabilization & postural awareness) Bent knee hip abd (hydrants) + GTB x20 (B) Clamshells + GTB x20 (B) Straight leg hip abd + GTB x10   PATIENT EDUCATION:  Education details: Updated HEP Person educated: Patient Education method: Explanation, Demonstration, Tactile cues, Verbal cues Education comprehension: verbalized understanding, returned demonstration, verbal cues required, tactile cues required, and needs further education    HOME EXERCISE PROGRAM: Access Code: [email or web address] URL: [email or web address] Date: 11/10/2023 Prepared by: Sims Duck  Exercises - Supine Knee Extension Strengthening  - 1 x daily - 7 x weekly - 2 sets - 15 reps - Squat with Chair Touch  - 1 x daily - 3 x weekly - 1-2 sets - 10 reps - Seated Long Arc Quad  - 1 x daily - 7 x weekly - 22 sets - 15 reps - Supine Bridge  - 1 x daily - 7 x weekly - 1-3 sets - 10 reps - 10 sec hold - Seated Hip Abduction with Resistance  - 1 x daily - 3 x weekly - 2 sets - 10 reps - Small Range Straight Leg Raise  - 1 x daily - 7 x weekly - 3 sets - 10 reps - Wall Quarter Squat  - 1 x daily - 7 x weekly - 3 sets - 10 reps - 5 sec hold - Sidelying Bent Knee Lift at 45 Degrees  - 1 x daily - 7 x weekly - 3 sets - 10 reps - Clam with Resistance  - 1 x daily - 7 x weekly - 3 sets - 10 reps -  Sidelying Hip Abduction with Resistance at Thighs  - 1 x  daily - 7 x weekly - 3 sets - 10 reps - Seated Knee Extension with Resistance  - 1 x daily - 7 x weekly - 3 sets - 10 reps   ASSESSMENT:  CLINICAL IMPRESSION:  Patient continues to report improvements in knee pain and mobility overall. She is most limited with L knee flexion and with weightbearing through the L knee. We worked on floor to stand transfers in kneeling as patient would like to start gardening and is concerned with her ability to do this without support. She also was wanting to try ACE wrapping of her knee for support which she has had in a previous episode, but this was deferred since it was the end of the visit. Patient reports she has been more compliant with her HEP and agrees that this is likely why her pain is better.   OBJECTIVE IMPAIRMENTS: Abnormal gait, decreased activity tolerance, decreased balance, decreased endurance, decreased mobility, difficulty walking, decreased ROM, decreased strength, and pain.   ACTIVITY LIMITATIONS: carrying, lifting, bending, standing, squatting, sleeping, stairs, transfers, and locomotion level  PARTICIPATION LIMITATIONS: meal prep, cleaning, laundry, community activity, and occupation  PERSONAL FACTORS: Age, Time since onset of injury/illness/exacerbation, and 3+ comorbidities: BPD1, afib, CAD w/ chest pain, dysphagia, HTN, GAD, depression, migraine, NSTEMI, OSA, recurrent syncope  are also affecting patient's functional outcome.   REHAB POTENTIAL: Good  CLINICAL DECISION MAKING: Evolving/moderate complexity  EVALUATION COMPLEXITY: Moderate   GOALS:  SHORT TERM GOALS: Target date: 10/04/2023  Pt will demonstrate appropriate understanding and performance of initially prescribed HEP in order to facilitate improved independence with management of symptoms.  Baseline: HEP  Goal status: IN PROGRESS  2. Pt will report at least 25% improvement in overall pain levels over past week  in order to facilitate improved tolerance to typical daily activities.   Baseline: 2-7/10  Goal status: IN PROGRESS   LONG TERM GOALS: Target date: 11/24/2023  Pt will improve MCID or greater on LEFS in order to demonstrate improved perception of function due to symptoms (MCID 9 pts) Baseline: LEFS 27/80 10/25/23: 29/80 = 36.3% Goal status: IN PROGRESS L   2.  Pt will demonstrate 5/5 MMT BIL knees/ankles for improved functional strength.  Baseline: See MMT chart above Goal status: IN PROGRESS  3.  Pt will report no more than 4/10 knee pain on NPS with average work day in order to facilitate improved tolerance to work tasks.  Baseline: increased pain at end of work day, up to 7/10;  10/25/23: 8/10 Goal status: IN PROGRESS  4.  Pt will be able to perform 5xSTS in less than or equal to 9sec in order to demonstrate reduced fall risk and improved functional independence (MCID 5xSTS = 2.3 sec). Baseline: 11sec; 10/25/23: 9 sec Goal status: MET  5. Pt will report at least 50% decrease in overall pain levels in past week in order to facilitate improved tolerance to basic ADLs/mobility.   Baseline: 2-7/10  Goal status: MET for R knee 50%; IN PROGRESS L KNEE   6. Pt will demonstrate appropriate performance of final prescribed HEP in order to facilitate improved self-management of symptoms post-discharge.   Baseline: HEP TBD  Goal status: IN PROGRESS   PLAN:  PT FREQUENCY: 1-2x/week  PT DURATION: 4 weeks  PLANNED INTERVENTIONS: 97164- PT Re-evaluation, 97110-Therapeutic exercises, 97530- Therapeutic activity, [id number]- Neuromuscular re-education, 97535- Self Care, 13086- Manual therapy, [phone]- Gait training, Patient/Family education, Balance training, Stair training, Taping, Dry Needling, Joint mobilization, Cryotherapy, and Moist heat  PLAN FOR  NEXT SESSION: Add prone quad stretch for L knee flexion to HEP. Possible trial of KT tape to L knee for support; Progress B LE strength and L knee ROM;  squatting and SLB activities as tolerated    Jinx Mourning, PT  11/15/2023 10:19 AM

## 2023-11-17 ENCOUNTER — Ambulatory Visit

## 2023-11-17 DIAGNOSIS — M6281 Muscle weakness (generalized): Secondary | ICD-10-CM

## 2023-11-17 DIAGNOSIS — G8929 Other chronic pain: Secondary | ICD-10-CM | POA: Diagnosis not present

## 2023-11-17 DIAGNOSIS — R262 Difficulty in walking, not elsewhere classified: Secondary | ICD-10-CM | POA: Diagnosis not present

## 2023-11-17 DIAGNOSIS — M25561 Pain in right knee: Secondary | ICD-10-CM | POA: Diagnosis not present

## 2023-11-17 DIAGNOSIS — M25562 Pain in left knee: Secondary | ICD-10-CM | POA: Diagnosis not present

## 2023-11-17 NOTE — Therapy (Addendum)
 OUTPATIENT PHYSICAL THERAPY LOWER EXTREMITY TREATMENT   Patient Name: Lindsay [name] MRN: [id number] DOB:[birth year], 68 y.o., female Today's Date: 11/17/2023  END OF SESSION:  PT End of Session - 11/17/23 1058     Visit Number 13    Number of Visits --    Date for PT Re-Evaluation 11/24/23    Authorization Type Medicare    Progress Note Due on Visit 17    PT Start Time 1058    PT Stop Time 1143    PT Time Calculation (min) 45 min    Activity Tolerance Patient tolerated treatment well    Behavior During Therapy [facility] for tasks assessed/performed            Past Medical History:  Diagnosis Date   Abnormal mammogram    Thick Tissue   Acquired trigger finger of right ring finger 04/20/2022   Arthritis    Bilateral wrist pain 04/20/2022   Bipolar 1 disorder    Cataract    OU   Cervical radiculopathy 10/14/2021   Chalazion of left lower eyelid 04/15/2022   Chest pain 02/11/2015   Chronic atrial fibrillation 09/08/2015   Colon polyp    Compressed cervical disc    Coronary artery disease involving native coronary artery with angina pectoris 03/24/2015   Dorsalgia 09/22/2018   Dysphagia 09/22/2018   Essential hypertension 03/03/2015   Generalized anxiety disorder    GERD without esophagitis 09/22/2018   Hematemesis 12/18/2019   Hepatic steatosis 04/22/2022   History of appendectomy 1963   Hypertensive retinopathy    OU   Irritable bowel syndrome without diarrhea 09/22/2018   Lactic acidosis 12/18/2019   LLQ pain 04/15/2022   Major depressive disorder    Migraine without status migrainosus, not intractable 09/22/2018   Mixed hyperlipidemia 05/07/2016   Non-ST elevation MI (NSTEMI) 07/26/2008   No cardiac catheterization at the time.   Obstructive sleep apnea    no CPAP use   Orthostasis    Osteoarthritis of carpometacarpal (CMC) joint of thumb 04/20/2022   Osteoarthritis of knee 09/22/2018   PAF (paroxysmal atrial fibrillation) 10/09/2018   Pain in joint of right  hip 07/03/2020   Pain in right knee 12/14/2018   Recurrent syncope    Rib pain 07/03/2020   Sciatica of right side 09/22/2018   Severe alcohol use disorder    Vitamin D  deficiency    Past Surgical History:  Procedure Laterality Date   APPENDECTOMY     CARDIAC CATHETERIZATION N/A 03/25/2015   Procedure: Left Heart Cath and Coronary Angiography;  Surgeon: Arleen Lacer, MD;  Location: [facility] INVASIVE CV LAB;  Service: Cardiovascular;  Laterality: N/A;   CORONARY STENT INTERVENTION N/A 10/09/2018   Procedure: CORONARY STENT INTERVENTION;  Surgeon: Lucendia Rusk, MD;  Location: [facility] INVASIVE CV LAB;  Service: Cardiovascular;  Laterality: N/A;   DILATATION & CURETTAGE/HYSTEROSCOPY WITH MYOSURE N/A 05/19/2016   Procedure: DILATATION & CURETTAGE/HYSTEROSCOPY WITH MYOSURE;  Surgeon: Johnn Najjar, MD;  Location: [facility];  Service: Gynecology;  Laterality: N/A;  Possible Myosure for polyps.   EYE SURGERY     LEFT HEART CATH AND CORONARY ANGIOGRAPHY N/A 10/09/2018   Procedure: LEFT HEART CATH AND CORONARY ANGIOGRAPHY;  Surgeon: Lucendia Rusk, MD;  Location: [facility] INVASIVE CV LAB;  Service: Cardiovascular;  Laterality: N/A;   MOUTH SURGERY     URETHRAL DILATION     Patient Active Problem List   Diagnosis Date Noted   B12 deficiency 10/24/2023   Seasonal allergies 10/21/2023   Gastroesophageal  reflux disease without esophagitis 10/21/2023   Epigastric pain 10/21/2023   Stress 02/02/2023   Insomnia 12/23/2022   Blood clotting disorder (HCC) 10/28/2022   Neck pain 10/27/2022   Acute thoracic back pain 10/27/2022   Lumbar radiculopathy 05/26/2022   Degeneration of lumbar intervertebral disc 05/26/2022   Major depressive disorder    Severe alcohol use disorder    Generalized anxiety disorder    Obstructive sleep apnea    Hepatic steatosis 04/22/2022   Osteoarthritis of carpometacarpal (CMC) joint of thumb 04/20/2022   Acquired trigger finger of right ring finger 04/20/2022   Bilateral  wrist pain 04/20/2022   Chalazion of left lower eyelid 04/15/2022   Bipolar 1 disorder (HCC)    Cervical radiculopathy 10/14/2021   Pain in joint of right hip 07/03/2020   Rib pain 07/03/2020   Recurrent syncope    Orthostasis    Lactic acidosis 12/18/2019   Hematemesis 12/18/2019   Pain in right knee 12/14/2018   PAF (paroxysmal atrial fibrillation) 10/09/2018   Dorsalgia 09/22/2018   Dysphagia 09/22/2018   GERD without esophagitis 09/22/2018   Irritable bowel syndrome without diarrhea 09/22/2018   Osteoarthritis of knee 09/22/2018   Polypharmacy 09/22/2018   Sciatica of right side 09/22/2018   Vitamin D  deficiency 09/22/2018   Mixed hyperlipidemia 05/07/2016   Chronic atrial fibrillation (HCC) 09/08/2015   Coronary artery disease involving native coronary artery with angina pectoris 03/24/2015   Essential hypertension 03/03/2015   Chest pain 02/11/2015   Non-ST elevation MI (NSTEMI) (HCC) 07/26/2008    PCP: Maudine Sos, MD  REFERRING PROVIDER: Taras Farber, PA-C  REFERRING DIAG: [phone] (ICD-10-CM) - Pain in right knee  THERAPY DIAG:  Chronic pain of both knees  Difficulty in walking, not elsewhere classified  Muscle weakness (generalized)  Rationale for Evaluation and Treatment: Rehabilitation  ONSET DATE: 3 years ago  SUBJECTIVE:   SUBJECTIVE STATEMENT: Patient reports her L knee is feeling "crunchy" today; states she worked yesterday and her knees are sore today.  Pt reports ~3 year history of BIL knee pain, R>L. Does not endorse any specific injury, reports this was preceded by job with more time on feet. States symptoms slowly seem to be worsening, knee will occasionally buckle. Tends to fluctuate, oftentimes based on work activities. Has fluctuating swelling as well through lateral R knee. She also endorses history of neck/UE pain, cramping in legs and arms for which she states she is in communication w/ providers. Also mentions a R shoulder  subluxation a couple months ago, states she will be getting an MRI soon. Also reports seeing counselor weekly for mental health hx.   PERTINENT HISTORY: BPD1, afib, CAD w/ chest pain, dysphagia, HTN, GAD, depression, migraine, NSTEMI, OSA, recurrent syncope  PAIN:  Are you having pain: 0/10 Location/description: R>L knee, posterior and anterior Best-worst over past week: 2-7/10  - aggravating factors: walking, standing ,stairs - Easing factors: pain ointment, pain medication  PRECAUTIONS: cardiac hx     WEIGHT BEARING RESTRICTIONS: No  FALLS:  Has patient fallen in last 6 months? No  LIVING ENVIRONMENT: Uses cane PRN for balance Lives w/ cats; lives in split level, 2 sets of stairs - BIL rails, 6-8 vs 8-10 steps on each  OCCUPATION: cashier  PLOF: Independent - enjoys gardening, art  PATIENT GOALS: build strength/muscle, get back to art  NEXT MD VISIT: TBD  OBJECTIVE:  Note: Objective measures were completed at Evaluation unless otherwise noted.  DIAGNOSTIC FINDINGS:  No recent imaging in chart - pt  states she has had XR, endorses some degenerative changes   PATIENT SURVEYS:   09/20/23 LEFS 27 / 80 = 33.8 % 10/25/23 29/80  COGNITION: Overall cognitive status: Within functional limits for tasks assessed     SENSATION: NT  EDEMA:  Pt endorses fluctuating edema in lateral R knee   LOWER EXTREMITY ROM:      Right eval Left eval  Hip flexion    Hip extension    Hip internal rotation    Hip external rotation    Knee extension A: full   A: full  Knee flexion A: 119 deg   A: 124 deg   (Blank rows = not tested) (Key: WFL = within functional limits not formally assessed, * = concordant pain, s = stiffness/stretching sensation, NT = not tested)  Comments:    10/27/23: R knee flex 130 deg    L knee flex 110 deg feels tight on top of knee  LOWER EXTREMITY MMT:    MMT Right eval Left eval  Hip flexion 4 4  Hip abduction (modified sitting) 5 5  Hip  internal rotation    Hip external rotation    Knee flexion 4+ 5  Knee extension 4+ 5  Ankle dorsiflexion 4+ 5   (Blank rows = not tested) (Key: WFL = within functional limits not formally assessed, * = concordant pain, s = stiffness/stretching sensation, NT = not tested)  Comments:    10/27/23:  hip flex R 4/5, L 4+5; R knee ext 5-/5, flex 4+/5,    LOWER EXTREMITY SPECIAL TESTS:  Deferred given time constraints  FUNCTIONAL TESTS:  5xSTS: 11.69sec w/ mild pain  TUG: 10.27sec w/o AD, antalgic gait  GAIT: Distance walked: within clinic Assistive device utilized: None Level of assistance: Complete Independence Comments: antalgic gait R>L, reduced gait speed/cadence                                                                                                                              TREATMENT DATE: [facility] Adult PT Treatment:                                                DATE: 11/17/2023 Therapeutic Exercise: NuStep L6 x 6 min + subjective intake Straight leg bridges on orange PB 2x15 Seated LAQ + 4#AW 15x5" Manual Therapy: Kinesiotape (L) knee for support (overlapping oval) Neuromuscular re-ed: Small range SLR + 1.5#AW 15x5" SAQ + 4#AW 15x5"   [facility] Adult PT Treatment:                                                DATE: 11/15/2023 Therapeutic Exercise: NuStep L6 x 7.5 min PT present to discuss  status LAQ + 4#AW x15 Seated hip flexion 4# x 15 Straight leg bridges on orange PB 2x15 Neuromuscular re-ed: SLR + 1#AW 15x5" Therapeutic Activities: Worked on kneeling and standing from kneeling with and without use of a table (or object) for UE support onto and off of foam pad to improve ability to garden independently. Discussed use of KT tape for support of knee as well as Ace Bandages which pt has used in the past.    [facility] Adult PT Treatment:                                                DATE: 11/10/2023 Therapeutic Exercise: NuStep L6 x 6 min LAQ + 4#AW x15 Straight leg  bridges on orange PB 2x15 Neuromuscular re-ed: SLR + 1#AW 15x5" Side Lying: Bent knee hip abd + GTB 15x3" Straight leg hip abd + GTB 15x3" Small leg circles in hip abd + GTB CW/CCW x5 each  PATIENT EDUCATION:  Education details: Updated HEP Person educated: Patient Education method: Explanation, Demonstration, Tactile cues, Verbal cues Education comprehension: verbalized understanding, returned demonstration, verbal cues required, tactile cues required, and needs further education    HOME EXERCISE PROGRAM: Access Code: [email or web address] URL: [email or web address] Date: 11/17/2023 Prepared by: Sims Duck  Exercises - Supine Knee Extension Strengthening  - 1 x daily - 7 x weekly - 2 sets - 15 reps - Squat with Chair Touch  - 1 x daily - 3 x weekly - 1-2 sets - 10 reps - Seated Long Arc Quad  - 1 x daily - 7 x weekly - 22 sets - 15 reps - Supine Bridge  - 1 x daily - 7 x weekly - 1-3 sets - 10 reps - 10 sec hold - Seated Hip Abduction with Resistance  - 1 x daily - 3 x weekly - 2 sets - 10 reps - Small Range Straight Leg Raise  - 1 x daily - 7 x weekly - 3 sets - 10 reps - Wall Quarter Squat  - 1 x daily - 7 x weekly - 3 sets - 10 reps - 5 sec hold - Sidelying Bent Knee Lift at 45 Degrees  - 1 x daily - 7 x weekly - 3 sets - 10 reps - Clam with Resistance  - 1 x daily - 7 x weekly - 3 sets - 10 reps - Sidelying Hip Abduction with Resistance at Thighs  - 1 x daily - 7 x weekly - 3 sets - 10 reps - Seated Knee Extension with Resistance  - 1 x daily - 7 x weekly - 3 sets - 10 reps - Prone Quadriceps Stretch with Strap  - 1-2 x daily - 7 x weekly - 1 sets - 3-5 reps - 30 sec hold   ASSESSMENT:  CLINICAL IMPRESSION: Kinesiotape aplied to L knee for stability and support. Patient continues to have pain in bilateral knees at start of session that decreases significantly after completing quad and glute exercises. Recommended patient add in resisted knee extension exercise to do  with resisted clamshells to activate quads and glutes before prolonged standing during work shift.   OBJECTIVE IMPAIRMENTS: Abnormal gait, decreased activity tolerance, decreased balance, decreased endurance, decreased mobility, difficulty walking, decreased ROM, decreased strength, and pain.   ACTIVITY LIMITATIONS: carrying, lifting, bending, standing, squatting, sleeping, stairs, transfers, and locomotion level  PARTICIPATION LIMITATIONS:  meal prep, cleaning, laundry, community activity, and occupation  PERSONAL FACTORS: Age, Time since onset of injury/illness/exacerbation, and 3+ comorbidities: BPD1, afib, CAD w/ chest pain, dysphagia, HTN, GAD, depression, migraine, NSTEMI, OSA, recurrent syncope  are also affecting patient's functional outcome.   REHAB POTENTIAL: Good  CLINICAL DECISION MAKING: Evolving/moderate complexity  EVALUATION COMPLEXITY: Moderate   GOALS:  SHORT TERM GOALS: Target date: 10/04/2023  Pt will demonstrate appropriate understanding and performance of initially prescribed HEP in order to facilitate improved independence with management of symptoms.  Baseline: HEP  Goal status: IN PROGRESS  2. Pt will report at least 25% improvement in overall pain levels over past week in order to facilitate improved tolerance to typical daily activities.   Baseline: 2-7/10  Goal status: IN PROGRESS   LONG TERM GOALS: Target date: 11/24/2023  Pt will improve MCID or greater on LEFS in order to demonstrate improved perception of function due to symptoms (MCID 9 pts) Baseline: LEFS 27/80 10/25/23: 29/80 = 36.3% Goal status: IN PROGRESS L   2.  Pt will demonstrate 5/5 MMT BIL knees/ankles for improved functional strength.  Baseline: See MMT chart above Goal status: IN PROGRESS  3.  Pt will report no more than 4/10 knee pain on NPS with average work day in order to facilitate improved tolerance to work tasks.  Baseline: increased pain at end of work day, up to 7/10;  10/25/23:  8/10 Goal status: IN PROGRESS  4.  Pt will be able to perform 5xSTS in less than or equal to 9sec in order to demonstrate reduced fall risk and improved functional independence (MCID 5xSTS = 2.3 sec). Baseline: 11sec; 10/25/23: 9 sec Goal status: MET  5. Pt will report at least 50% decrease in overall pain levels in past week in order to facilitate improved tolerance to basic ADLs/mobility.   Baseline: 2-7/10  Goal status: MET for R knee 50%; IN PROGRESS L KNEE   6. Pt will demonstrate appropriate performance of final prescribed HEP in order to facilitate improved self-management of symptoms post-discharge.   Baseline: HEP TBD  Goal status: IN PROGRESS   PLAN:  PT FREQUENCY: 1-2x/week  PT DURATION: 4 weeks  PLANNED INTERVENTIONS: 97164- PT Re-evaluation, 97110-Therapeutic exercises, 97530- Therapeutic activity, 97112- Neuromuscular re-education, 97535- Self Care, 98119- Manual therapy, [phone]- Gait training, Patient/Family education, Balance training, Stair training, Taping, Dry Needling, Joint mobilization, Cryotherapy, and Moist heat  PLAN FOR NEXT SESSION: Re-eval next visit. Assess response to K tape on L knee.    Sims Duck, PTA 11/17/2023 11:52 AM

## 2023-11-18 NOTE — Telephone Encounter (Signed)
     Primary Cardiologist: Maudine Sos, MD  Chart reviewed as part of pre-operative protocol coverage. Given past medical history and time since last visit, based on ACC/AHA guidelines, Lindsay [name] would be at acceptable risk for the planned procedure without further cardiovascular testing.   Her Plavix  may be held for 5 days prior to her procedure.  Please resume as soon as hemostasis is achieved.  Her RCRI is low risk, 0.9% risk of major cardiac event.  I will route this recommendation to the requesting party via Epic fax function and remove from pre-op pool.  Please call with questions.  Chet Cota. Victor Langenbach NP-C     11/18/2023, 11:42 AM [facility] [address] Office [phone] Fax [phone]

## 2023-11-22 ENCOUNTER — Ambulatory Visit: Payer: Self-pay | Admitting: Physical Therapy

## 2023-11-22 ENCOUNTER — Encounter: Payer: Self-pay | Admitting: Physical Therapy

## 2023-11-22 ENCOUNTER — Ambulatory Visit (INDEPENDENT_AMBULATORY_CARE_PROVIDER_SITE_OTHER): Admitting: Licensed Clinical Social Worker

## 2023-11-22 DIAGNOSIS — F0630 Mood disorder due to known physiological condition, unspecified: Secondary | ICD-10-CM | POA: Diagnosis not present

## 2023-11-22 DIAGNOSIS — F1020 Alcohol dependence, uncomplicated: Secondary | ICD-10-CM | POA: Diagnosis not present

## 2023-11-22 DIAGNOSIS — R262 Difficulty in walking, not elsewhere classified: Secondary | ICD-10-CM

## 2023-11-22 DIAGNOSIS — M6281 Muscle weakness (generalized): Secondary | ICD-10-CM | POA: Diagnosis not present

## 2023-11-22 DIAGNOSIS — M25561 Pain in right knee: Secondary | ICD-10-CM | POA: Diagnosis not present

## 2023-11-22 DIAGNOSIS — G8929 Other chronic pain: Secondary | ICD-10-CM

## 2023-11-22 DIAGNOSIS — M25562 Pain in left knee: Secondary | ICD-10-CM | POA: Diagnosis not present

## 2023-11-22 NOTE — Progress Notes (Signed)
 THERAPIST PROGRESS NOTE  Session Time: 2:00 PM to 2:51 PM  Participation Level: Active  Behavioral Response: CasualAlertAnxious, Depressed, and Euthymic  Type of Therapy: Individual Therapy  Treatment Goals addressed: work on Pharmacologist for anxiety depression to decrease symptoms, work on mood management, mood stabilization, coping in general, patient is collaterally managing substance use issues through AA, coping, address significant stressor of state of her house and home repair is really necessary   ProgressTowards Goals: Progressing-therapy continues to be helpful for coping noted patient experiencing depression looked at the source feeling helpless people in charge and country explored solutions including Buddhism nature gardening helping people therapist encouraged doing her art, added laughing  Interventions: Solution Focused, Strength-based, Supportive, and Other: coping  Summary: Lindsay [name] is a 68 y.o. female who presents with trying to get back to gardening only therapy besides this therapy. Shares that God brings her intentionality and making decision brings her to people she needs to know.  Met someone at [employer] conversation was very helpful for her somebody works in gardening helpful patient say been dealing with a lot of stress.  Trying to following up on things happened instead of forgetting.  Issues with Production designer, theatre/television/film.  Patient describes to therapist what happens when tired and in pain something in brain stimulated goes around the pain and being tired and talks, is awake alert, gets pain, suffering, stepping outside herself. Have to slow down. This happened with [name] not eating, not sleep tired going on and on. Problems today sad compensating for being sad. Hard to figure out sense of powerless, fear quack jobs people in power what do to Americans financially. Reasonable educated think recession by August.  Patient remembers going through Stagflation lived through and pretty much  destroyed family.  Understanding what is coming is terrifying. [place] has access to all this data. How get through laugh reviewed his Buddhist philosophy helpless don't control anything lucky if control own mind work on controlling mind if control mind lucky controlling outside impossible really what we have control over are our choices, how react, patient says the practice makes you aware of thoughts Zen buddhism notice thought being present. As a result she drives different don't react to others the way used. Step outside of herself to observe thought process and how reacting in the moment in the moment hard to do and takes practice.  Therapist said we use those practices in therapy now to noticed so we can intervene.  We talked about leaning how to meditate deeply in Zen practice.  Therapist followed up with looking for more practices to help with current situation patient said nature, more nature, planting herbs and vegetables. Pot them make food from them so don't have to buy. 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  Shared about thinks ill have a great summer connecting.. [place] neighbor. Design gardens helping people makes her feel great. Buddhism Sam Raquel Cables is key to that.  Patient described fugue as we explored haven't had in awhile joyful talking to someone and getting the number.  Therapist reframe not is negative but is flowing something that is positive and patient relates it as a positive experience.  Art also a way to cope how dealt with crazy parents organizing and cleaning her art supplies. Looking files simply life as older can handle complexity can't manage complexity.   Explored she is with patient recognizes she needs to slow down but then explains it is her way of coping  with pain when tired when hungry.  As we explored further as her mass for depression looked at what the source of depression is feeling sense of powerlessness people in  charge right now in [place] as quack jobs.  As we talked about it very therapeutic as patient began to come up with her own answers send Buddhism recognizing only control of your own mind and if that lucky control your decisions being in nature her gardening happy about her connection with other people enthusiastic about how that will impact her summer.  Discussed stressors in general stress with manager how that is evolved and seems better but patient again realizing she has to slow down.  Patient joining in session very impressive drawing therapist encouraging her with this for coping would be soothing would be good for mental health also something she has done in the past could lead to some possible project.  Talked about other issues not being able to handle complexity therapist notes this is come up in session feeling of being overwhelmed and needing to organize herself noted how she is doing this is by simplifying things.  Were able to laugh in session and noted that is helpful just to laugh about everything what else can you do.  Suicidal/Homicidal: No  Plan: Return again in 1 weeks.2.  Utilize therapeutic relationship insights from patient and therapist to work on issues work on coping  Diagnosis: Mood disorder in conditions classified elsewhere, Generalized  anxiety disorder, severe alcohol use  disorder  Collaboration of Care: Other none needed  Patient/Guardian was advised Release of Information must be obtained prior to any record release in order to collaborate their care with an outside provider. Patient/Guardian was advised if they have not already done so to contact the registration department to sign all necessary forms in order for us  to release information regarding their care.   Consent: Patient/Guardian gives verbal consent for treatment and assignment of benefits for services provided during this visit. Patient/Guardian expressed understanding and agreed to proceed.   Dallie Duel,  LCSW 11/22/2023

## 2023-11-22 NOTE — Therapy (Addendum)
 OUTPATIENT PHYSICAL THERAPY LOWER EXTREMITY TREATMENT AND DISCHARGE SUMMARY   Patient Name: Quinnetta Maceachern MRN: [id number] DOB:[birth year], 68 y.o., female Today's Date: 11/22/2023  END OF SESSION:  PT End of Session - 11/22/23 1019     Visit Number 14    Date for PT Re-Evaluation 11/24/23    Authorization Type Medicare    Progress Note Due on Visit 17    PT Start Time 1018    PT Stop Time 1100    PT Time Calculation (min) 42 min    Activity Tolerance Patient tolerated treatment well    Behavior During Therapy [facility] for tasks assessed/performed            Past Medical History:  Diagnosis Date   Abnormal mammogram    Thick Tissue   Acquired trigger finger of right ring finger 04/20/2022   Arthritis    Bilateral wrist pain 04/20/2022   Bipolar 1 disorder    Cataract    OU   Cervical radiculopathy 10/14/2021   Chalazion of left lower eyelid 04/15/2022   Chest pain 02/11/2015   Chronic atrial fibrillation 09/08/2015   Colon polyp    Compressed cervical disc    Coronary artery disease involving native coronary artery with angina pectoris 03/24/2015   Dorsalgia 09/22/2018   Dysphagia 09/22/2018   Essential hypertension 03/03/2015   Generalized anxiety disorder    GERD without esophagitis 09/22/2018   Hematemesis 12/18/2019   Hepatic steatosis 04/22/2022   History of appendectomy 1963   Hypertensive retinopathy    OU   Irritable bowel syndrome without diarrhea 09/22/2018   Lactic acidosis 12/18/2019   LLQ pain 04/15/2022   Major depressive disorder    Migraine without status migrainosus, not intractable 09/22/2018   Mixed hyperlipidemia 05/07/2016   Non-ST elevation MI (NSTEMI) 07/26/2008   No cardiac catheterization at the time.   Obstructive sleep apnea    no CPAP use   Orthostasis    Osteoarthritis of carpometacarpal (CMC) joint of thumb 04/20/2022   Osteoarthritis of knee 09/22/2018   PAF (paroxysmal atrial fibrillation) 10/09/2018   Pain in joint of right hip  07/03/2020   Pain in right knee 12/14/2018   Recurrent syncope    Rib pain 07/03/2020   Sciatica of right side 09/22/2018   Severe alcohol use disorder    Vitamin D  deficiency    Past Surgical History:  Procedure Laterality Date   APPENDECTOMY     CARDIAC CATHETERIZATION N/A 03/25/2015   Procedure: Left Heart Cath and Coronary Angiography;  Surgeon: Arleen Lacer, MD;  Location: [facility] INVASIVE CV LAB;  Service: Cardiovascular;  Laterality: N/A;   CORONARY STENT INTERVENTION N/A 10/09/2018   Procedure: CORONARY STENT INTERVENTION;  Surgeon: Lucendia Rusk, MD;  Location: [facility] INVASIVE CV LAB;  Service: Cardiovascular;  Laterality: N/A;   DILATATION & CURETTAGE/HYSTEROSCOPY WITH MYOSURE N/A 05/19/2016   Procedure: DILATATION & CURETTAGE/HYSTEROSCOPY WITH MYOSURE;  Surgeon: Johnn Najjar, MD;  Location: [facility];  Service: Gynecology;  Laterality: N/A;  Possible Myosure for polyps.   EYE SURGERY     LEFT HEART CATH AND CORONARY ANGIOGRAPHY N/A 10/09/2018   Procedure: LEFT HEART CATH AND CORONARY ANGIOGRAPHY;  Surgeon: Lucendia Rusk, MD;  Location: [facility] INVASIVE CV LAB;  Service: Cardiovascular;  Laterality: N/A;   MOUTH SURGERY     URETHRAL DILATION     Patient Active Problem List   Diagnosis Date Noted   B12 deficiency 10/24/2023   Seasonal allergies 10/21/2023   Gastroesophageal reflux disease without esophagitis  10/21/2023   Epigastric pain 10/21/2023   Stress 02/02/2023   Insomnia 12/23/2022   Blood clotting disorder (HCC) 10/28/2022   Neck pain 10/27/2022   Acute thoracic back pain 10/27/2022   Lumbar radiculopathy 05/26/2022   Degeneration of lumbar intervertebral disc 05/26/2022   Major depressive disorder    Severe alcohol use disorder    Generalized anxiety disorder    Obstructive sleep apnea    Hepatic steatosis 04/22/2022   Osteoarthritis of carpometacarpal (CMC) joint of thumb 04/20/2022   Acquired trigger finger of right ring finger 04/20/2022   Bilateral  wrist pain 04/20/2022   Chalazion of left lower eyelid 04/15/2022   Bipolar 1 disorder (HCC)    Cervical radiculopathy 10/14/2021   Pain in joint of right hip 07/03/2020   Rib pain 07/03/2020   Recurrent syncope    Orthostasis    Lactic acidosis 12/18/2019   Hematemesis 12/18/2019   Pain in right knee 12/14/2018   PAF (paroxysmal atrial fibrillation) 10/09/2018   Dorsalgia 09/22/2018   Dysphagia 09/22/2018   GERD without esophagitis 09/22/2018   Irritable bowel syndrome without diarrhea 09/22/2018   Osteoarthritis of knee 09/22/2018   Polypharmacy 09/22/2018   Sciatica of right side 09/22/2018   Vitamin D  deficiency 09/22/2018   Mixed hyperlipidemia 05/07/2016   Chronic atrial fibrillation (HCC) 09/08/2015   Coronary artery disease involving native coronary artery with angina pectoris 03/24/2015   Essential hypertension 03/03/2015   Chest pain 02/11/2015   Non-ST elevation MI (NSTEMI) (HCC) 07/26/2008    PCP: Maudine Sos, MD  REFERRING PROVIDER: Taras Farber, PA-C  REFERRING DIAG: [phone] (ICD-10-CM) - Pain in right knee  THERAPY DIAG:  Chronic pain of both knees  Difficulty in walking, not elsewhere classified  Muscle weakness (generalized)  Rationale for Evaluation and Treatment: Rehabilitation  ONSET DATE: 3 years ago  SUBJECTIVE:   SUBJECTIVE STATEMENT: I'm hurting all over today. I spend about an hour each morning stretching out. Average pain level in the knees is 5/10. Patient is experiencing more episodes of R knee giving way when stepping or walking.   Pt reports ~3 year history of BIL knee pain, R>L. Does not endorse any specific injury, reports this was preceded by job with more time on feet. States symptoms slowly seem to be worsening, knee will occasionally buckle. Tends to fluctuate, oftentimes based on work activities. Has fluctuating swelling as well through lateral R knee. She also endorses history of neck/UE pain, cramping in legs and  arms for which she states she is in communication w/ providers. Also mentions a R shoulder subluxation a couple months ago, states she will be getting an MRI soon. Also reports seeing counselor weekly for mental health hx.   PERTINENT HISTORY: BPD1, afib, CAD w/ chest pain, dysphagia, HTN, GAD, depression, migraine, NSTEMI, OSA, recurrent syncope  PAIN:  Are you having pain: 5/10 Location/description: R>L knee, posterior and anterior Best-worst over past week: 2-7/10  - aggravating factors: walking, standing ,stairs - Easing factors: pain ointment, pain medication  PRECAUTIONS: cardiac hx     WEIGHT BEARING RESTRICTIONS: No  FALLS:  Has patient fallen in last 6 months? No  LIVING ENVIRONMENT: Uses cane PRN for balance Lives w/ cats; lives in split level, 2 sets of stairs - BIL rails, 6-8 vs 8-10 steps on each  OCCUPATION: cashier  PLOF: Independent - enjoys gardening, art  PATIENT GOALS: build strength/muscle, get back to art  NEXT MD VISIT: TBD  OBJECTIVE:  Note: Objective measures were completed at Evaluation  unless otherwise noted.  DIAGNOSTIC FINDINGS:  No recent imaging in chart - pt states she has had XR, endorses some degenerative changes   PATIENT SURVEYS:   09/20/23 LEFS 27 / 80 = 33.8 % 10/25/23 29/80 11/22/23 LEFS 31 / 80 = 38.8 %  COGNITION: Overall cognitive status: Within functional limits for tasks assessed     SENSATION: NT  EDEMA:  Pt endorses fluctuating edema in lateral R knee   LOWER EXTREMITY ROM:      Right eval Left eval  Hip flexion    Hip extension    Hip internal rotation    Hip external rotation    Knee extension A: full   A: full  Knee flexion A: 119 deg   A: 124 deg   (Blank rows = not tested) (Key: WFL = within functional limits not formally assessed, * = concordant pain, s = stiffness/stretching sensation, NT = not tested)  Comments:    10/27/23: R knee flex 130 deg    L knee flex 110 deg feels tight on top of  knee  LOWER EXTREMITY MMT:    MMT Right eval Left eval  Hip flexion 4 4  Hip abduction (modified sitting) 5 5  Hip internal rotation    Hip external rotation    Knee flexion 4+ 5  Knee extension 4+ 5  Ankle dorsiflexion 4+ 5   (Blank rows = not tested) (Key: WFL = within functional limits not formally assessed, * = concordant pain, s = stiffness/stretching sensation, NT = not tested)  Comments:    10/27/23:  hip flex R 4/5, L 4+/5; R knee ext 5-/5, flex 4+/5,   11/22/23:  hip flex R 5/5, L 4+/5; R knee ext 5/5, L knee ext 5-/5, R flex 5/5, L knee flex 4+/5  LOWER EXTREMITY SPECIAL TESTS:  Deferred given time constraints  FUNCTIONAL TESTS:  5xSTS: 11.69sec w/ mild pain  TUG: 10.27sec w/o AD, antalgic gait  GAIT: Distance walked: within clinic Assistive device utilized: None Level of assistance: Complete Independence Comments: antalgic gait R>L, reduced gait speed/cadence                                                                                                                              TREATMENT DATE: [facility] Adult PT Treatment:                                                DATE: 11/22/2023 Therapeutic Exercise: NuStep L6 x 7 min + subjective intake Prone knee flexion x 20 feels some in low back as well Straight leg bridges on orange PB 2x15 Manual Therapy: Supportive wrapping with ACE bandages brought in by patient for self use at work to prevent knee giving way Neuromuscular re-ed: Small range SLR + 1.5#AW 15x5" Therapeutic Activities: Goals assessed, LEFS completed,  and POC discussed  [facility] Adult PT Treatment:                                                DATE: 11/17/2023 Therapeutic Exercise: NuStep L6 x 6 min + subjective intake Straight leg bridges on orange PB 2x15 Seated LAQ + 4#AW 15x5" Manual Therapy: Kinesiotape (L) knee for support (overlapping oval) Neuromuscular re-ed: Small range SLR + 1.5#AW 15x5" SAQ + 4#AW 15x5"   [facility] Adult PT Treatment:                                                 DATE: 11/15/2023 Therapeutic Exercise: NuStep L6 x 7.5 min PT present to discuss status LAQ + 4#AW x15 Seated hip flexion 4# x 15 Straight leg bridges on orange PB 2x15 Neuromuscular re-ed: SLR + 1#AW 15x5" Therapeutic Activities: Worked on kneeling and standing from kneeling with and without use of a table (or object) for UE support onto and off of foam pad to improve ability to garden independently. Discussed use of KT tape for support of knee as well as Ace Bandages which pt has used in the past.    [facility] Adult PT Treatment:                                                DATE: 11/10/2023 Therapeutic Exercise: NuStep L6 x 6 min LAQ + 4#AW x15 Straight leg bridges on orange PB 2x15 Neuromuscular re-ed: SLR + 1#AW 15x5" Side Lying: Bent knee hip abd + GTB 15x3" Straight leg hip abd + GTB 15x3" Small leg circles in hip abd + GTB CW/CCW x5 each  PATIENT EDUCATION:  Education details: Updated HEP Person educated: Patient Education method: Explanation, Demonstration, Tactile cues, Verbal cues Education comprehension: verbalized understanding, returned demonstration, verbal cues required, tactile cues required, and needs further education    HOME EXERCISE PROGRAM: Access Code: [email or web address] URL: [email or web address] Date: 11/17/2023 Prepared by: Sims Duck  Exercises - Supine Knee Extension Strengthening  - 1 x daily - 7 x weekly - 2 sets - 15 reps - Squat with Chair Touch  - 1 x daily - 3 x weekly - 1-2 sets - 10 reps - Seated Long Arc Quad  - 1 x daily - 7 x weekly - 22 sets - 15 reps - Supine Bridge  - 1 x daily - 7 x weekly - 1-3 sets - 10 reps - 10 sec hold - Seated Hip Abduction with Resistance  - 1 x daily - 3 x weekly - 2 sets - 10 reps - Small Range Straight Leg Raise  - 1 x daily - 7 x weekly - 3 sets - 10 reps - Wall Quarter Squat  - 1 x daily - 7 x weekly - 3 sets - 10 reps - 5 sec hold - Sidelying Bent Knee  Lift at 45 Degrees  - 1 x daily - 7 x weekly - 3 sets - 10 reps - Clam with Resistance  - 1 x daily - 7 x weekly - 3 sets - 10 reps - Sidelying Hip Abduction  with Resistance at Thighs  - 1 x daily - 7 x weekly - 3 sets - 10 reps - Seated Knee Extension with Resistance  - 1 x daily - 7 x weekly - 3 sets - 10 reps - Prone Quadriceps Stretch with Strap  - 1-2 x daily - 7 x weekly - 1 sets - 3-5 reps - 30 sec hold   ASSESSMENT:  CLINICAL IMPRESSION: Danasha Enloe presents with ongoing knee pain. She reports that the right knee has improved with PT and she demonstrates improved strength in the R knee. The right knee is giving way intermittently with standing and walking despite improved strength. The left knee has worsened following a fall a few months ago. She reports improvement at the end of her PT sessions and agrees that if she were more compliant at home her knees would feel better. We discussed having a friend hold her accountable for performing her HEP. Her LEFS indicates that she has made no functional improvements. Due to her lack of progress and lack of compliance with her HEP, pt will be discharged at this time.     OBJECTIVE IMPAIRMENTS: Abnormal gait, decreased activity tolerance, decreased balance, decreased endurance, decreased mobility, difficulty walking, decreased ROM, decreased strength, and pain.   ACTIVITY LIMITATIONS: carrying, lifting, bending, standing, squatting, sleeping, stairs, transfers, and locomotion level  PARTICIPATION LIMITATIONS: meal prep, cleaning, laundry, community activity, and occupation  PERSONAL FACTORS: Age, Time since onset of injury/illness/exacerbation, and 3+ comorbidities: BPD1, afib, CAD w/ chest pain, dysphagia, HTN, GAD, depression, migraine, NSTEMI, OSA, recurrent syncope  are also affecting patient's functional outcome.   REHAB POTENTIAL: Good  CLINICAL DECISION MAKING: Evolving/moderate complexity  EVALUATION COMPLEXITY:  Moderate   GOALS:  SHORT TERM GOALS: Target date: 10/04/2023  Pt will demonstrate appropriate understanding and performance of initially prescribed HEP in order to facilitate improved independence with management of symptoms.  Baseline: HEP  Goal status: MET  2. Pt will report at least 25% improvement in overall pain levels over past week in order to facilitate improved tolerance to typical daily activities.   Baseline: 2-7/10  Goal status: NOT MET   LONG TERM GOALS: Target date: 11/24/2023  Pt will improve MCID or greater on LEFS in order to demonstrate improved perception of function due to symptoms (MCID 9 pts) Baseline: LEFS 27/80 10/25/23: 29/80 = 36.3%, 11/22/23:  31 / 80 = 38.8 % Goal status: NOT MET  2.  Pt will demonstrate 5/5 MMT BIL knees/ankles for improved functional strength.  Baseline: See MMT chart above Goal status: PARTIALLY MET 11/22/23  3.  Pt will report no more than 4/10 knee pain on NPS with average work day in order to facilitate improved tolerance to work tasks.  Baseline: increased pain at end of work day, up to 7/10;  10/25/23: 8/10 Goal status: NOT MET  4.  Pt will be able to perform 5xSTS in less than or equal to 9sec in order to demonstrate reduced fall risk and improved functional independence (MCID 5xSTS = 2.3 sec). Baseline: 11sec; 10/25/23: 9 sec Goal status: MET  5. Pt will report at least 50% decrease in overall pain levels in past week in order to facilitate improved tolerance to basic ADLs/mobility.   Baseline: 2-7/10  Goal status: MET for R knee 65/70% 11/22/23; IN PROGRESS L KNEE -IN PROGRESS  6. Pt will demonstrate appropriate performance of final prescribed HEP in order to facilitate improved self-management of symptoms post-discharge.   Baseline: HEP TBD  Goal status: NOT  MET   PLAN:  PT FREQUENCY: 1-2x/week  PT DURATION: 4 weeks  PLANNED INTERVENTIONS: 97164- PT Re-evaluation, 97110-Therapeutic exercises, 97530- Therapeutic activity,  97112- Neuromuscular re-education, 97535- Self Care, 62130- Manual therapy, [phone]- Gait training, Patient/Family education, Balance training, Stair training, Taping, Dry Needling, Joint mobilization, Cryotherapy, and Moist heat  PLAN FOR NEXT SESSION: D/C TO HEP   Jinx Mourning, PT  11/22/2023 1:05 PM   PHYSICAL THERAPY DISCHARGE SUMMARY  Visits from Start of Care: 14  Current functional level related to goals / functional outcomes: See above   Remaining deficits: See above   Education / Equipment: HEP   Patient agrees to discharge. Patient goals were partially met. Patient is being discharged due to lack of progress.  Jinx Mourning, PT 11/22/23 1:05 PM  [facility] at [facility] [address] [place], [place] [address]  [phone] (office) [phone] (fax)

## 2023-11-24 ENCOUNTER — Encounter: Payer: Self-pay | Admitting: Physical Therapy

## 2023-11-25 ENCOUNTER — Encounter

## 2023-11-25 DIAGNOSIS — R112 Nausea with vomiting, unspecified: Secondary | ICD-10-CM | POA: Diagnosis not present

## 2023-11-25 DIAGNOSIS — K219 Gastro-esophageal reflux disease without esophagitis: Secondary | ICD-10-CM | POA: Diagnosis not present

## 2023-11-25 DIAGNOSIS — K2950 Unspecified chronic gastritis without bleeding: Secondary | ICD-10-CM | POA: Diagnosis not present

## 2023-11-29 ENCOUNTER — Encounter (HOSPITAL_BASED_OUTPATIENT_CLINIC_OR_DEPARTMENT_OTHER): Payer: Medicare Other | Admitting: Internal Medicine

## 2023-11-29 ENCOUNTER — Encounter

## 2023-11-29 ENCOUNTER — Ambulatory Visit (INDEPENDENT_AMBULATORY_CARE_PROVIDER_SITE_OTHER): Admitting: Licensed Clinical Social Worker

## 2023-11-29 DIAGNOSIS — F411 Generalized anxiety disorder: Secondary | ICD-10-CM | POA: Diagnosis not present

## 2023-11-29 DIAGNOSIS — F1020 Alcohol dependence, uncomplicated: Secondary | ICD-10-CM

## 2023-11-29 DIAGNOSIS — F0630 Mood disorder due to known physiological condition, unspecified: Secondary | ICD-10-CM | POA: Diagnosis not present

## 2023-11-29 NOTE — Progress Notes (Signed)
 THERAPIST PROGRESS NOTE  Session Time: 10:00 AM to 10:50 AM  Participation Level: Active  Behavioral Response: CasualAlertAnxious and Depressed  Type of Therapy: Individual Therapy  Treatment Goals addressed: work on Pharmacologist for anxiety depression to decrease symptoms, work on mood management, mood stabilization, coping in general, patient is collaterally managing substance use issues through AA, coping, address significant stressor of state of her house and home repair is really necessary   ProgressTowards Goals: Progressing-patient experiencing significant stressors using therapy to process thoughts and feelings help with coping as well as creating narratives that will help with coping as well  Interventions: Solution Focused, Strength-based, Supportive, and Other: Coping  Summary: Lindsay [name] is a 68 y.o. female who presents with feelings dark [name] going to [place] and not taking her this will probably be her last trip with knees. Patient couldn't participate not a irrational decision on her part but hurt..  Feel bad and accept but feeling bad is tied to other issues her world is getting smaller terribly concerned about financial future and capacity to work. "The ghoul and all minions". Patient said can't live without social security and worried that will come after her with student financial loans would be homeless, couldn't eat, feed her cats, seize income. Owe taxes so over paying on her checks to make a good faith effort to pay taxes. Feels like death waiting at the door her death and cats death. Afraid. Look at sleep tracker not getting sleep sleep cycle out of whack go to sleep earlier. 4 hours of sleep don't eat if haven't gone around to it at night. Does eat a breakfast know have to go to work. Change hours. Likes a shorter day breakfast, 15 minutes has a yoghurt. At home if time fix a dinner if in pain lay down. Has to discipline herself.  Talked about what she cooks and can cook  some simple things that are still good.  Therapist pointed out our small world has a lot to offer and we talked about that patient says will start going alone. Walk old [place]. Going to little shops. Wants to go to mountains. [place] doesn't mind walking around. Research buildings to visit. Need the history or won't impress as well.  Talked about gardening and plan for that.  Not sleeping hard to get to work due to pain taking more pain meds makes her groggy everything making it by the "skin of her teeth" struggling with paying bills.  One positive is  Zac has her back at work giving her more hours.  Also finding out why [name] has blank affect related to how her mom was helpful understanding help with being more understanding.  Patient struggling describes that as her world is getting smaller lots of fears with current state of things assess helpful for her to process getting feedback from therapist as well to help with coping.  Therapist changing narrative even the world smaller it still a world worth living does not have to be on a grams gal but a lot of worthwhile things on a small scale and we explored that.  There are fears with current state of things think helpful to have an outlet where patient receives supportive strength-based intervention therapist guiding patient on focusing on her strengths.  Patient a wealth of knowledge therapist notices these things about patient so she can celebrate all these positive qualities.  Therapist will continue to guide patient focusing on strengths and her talents to utilize in coping helping her  with her perspective with different ideas of what she could do that may help her with her situation.  Assessed at the same time helpful for validation providing space and support for patient to process thoughts and feelings Suicidal/Homicidal: No  Plan: Return again in 1 week.2.  Processed thoughts and feelings help with stressors. 3.  Work on reframing and creating  alternative narratives that help patient with coping  Diagnosis: Mood disorder in conditions classified elsewhere, Generalized  anxiety disorder, severe alcohol use  disorder  Collaboration of Care: Other none needed  Patient/Guardian was advised Release of Information must be obtained prior to any record release in order to collaborate their care with an outside provider. Patient/Guardian was advised if they have not already done so to contact the registration department to sign all necessary forms in order for us  to release information regarding their care.   Consent: Patient/Guardian gives verbal consent for treatment and assignment of benefits for services provided during this visit. Patient/Guardian expressed understanding and agreed to proceed.   Dallie Duel, LCSW 11/29/2023

## 2023-12-01 ENCOUNTER — Encounter

## 2023-12-01 ENCOUNTER — Encounter: Payer: Self-pay | Admitting: Family Medicine

## 2023-12-06 ENCOUNTER — Encounter: Admitting: Physical Therapy

## 2023-12-06 ENCOUNTER — Ambulatory Visit (INDEPENDENT_AMBULATORY_CARE_PROVIDER_SITE_OTHER): Admitting: Licensed Clinical Social Worker

## 2023-12-06 DIAGNOSIS — F1020 Alcohol dependence, uncomplicated: Secondary | ICD-10-CM

## 2023-12-06 DIAGNOSIS — F411 Generalized anxiety disorder: Secondary | ICD-10-CM

## 2023-12-06 DIAGNOSIS — F0630 Mood disorder due to known physiological condition, unspecified: Secondary | ICD-10-CM

## 2023-12-06 NOTE — Progress Notes (Signed)
 THERAPIST PROGRESS NOTE  Session Time: 2:00 PM to 2:48 PM  Participation Level: Active  Behavioral Response: CasualAlertDepressed-depressed but euthymic in session  Type of Therapy: Individual Therapy  Treatment Goals addressed: work on Pharmacologist for anxiety depression to decrease symptoms, work on mood management, mood stabilization, coping in general, patient is collaterally managing substance use issues through AA, coping, address significant stressor of state of her house and home repair is really necessary   ProgressTowards Goals: Progressing-processing through feelings related to depression therapy helpful for coping therapist providing supportive interventions highlighting patient's strengths therapy sessions become mood enhancing  Interventions: Solution Focused, Strength-based, Supportive, and Other: coping  Summary: Lindsay [name] is a 68 y.o. female who presents with been sleeping badly finally then finally went to sleep for 13 hours because exhausted. Not feeling great sleep patterns awful for 2-3 weeks got so exhausted and collapse, issue as well lay down to sleep nerves and muscles hurt and and joints tight.  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 Mom didn't get enough credit for her artistic ability. Not sleeping well think depression factors into that. Weirdest dreams. Dreams very anxiety based. Where remember where want to accomplish something want to overcome obstacle come up with something failure and doesn't like that feeling. Shared other anxiety dreams protecting something rescuing cats where gets overwhelming.  Used to have bad dreams about tornadoes ultimate thing no control had one younger.  Something encouraging more forms of  intelligence evolved on planet then understand. Apes problems solving for their way of life. Our brains helped us  to survive. [age] year old from neanderthal therapist noted that can see in us . We have learned to cooperate that is how we survived.Illene [name] to be glad for others. Gorman Laughter gave her a compliment asked her for money can't afford gasoline and cat food. $40 left more than promised.  The complement she gave make connections to people instantly and always want to help [employer] arranging flights would be perfect get therapist thinks that the genuine complement fits patient.  Thinking about why can work through physical pain at [employer] and be dedicated fully herself there. 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 Therapist noted then it is good for her if can be genuine. Like to go to AA care about people again a great quality of patients. Talked doesn't share often but talked about what she shared. Don't believe but do. Crazy things happen out of the blue. In a situation something happens because of us  or pure random luck. Universe God multiple or are connected to one limited brain able to figure out.  Shared something helpful from meeting do not believe just do thought that was good advice.  Reviewing symptoms recent events noting working with patient on her depression assess helpful for her to be in session to be open and honest as she herself reports helping her with this.  We processed through her friend going to [place] therapist thinks the processing of her thoughts and feelings has some effect on helping her cope.  We both identified trouble sleep related to depression also having crazy dreams.  Noted some highlights of session included  patient can be herself at work, [name] giving her genuine complement of her therapist that she cares about people can make connections instantly as well therapist sees that very much and patient sees how she would do well  with a job like arranging flights for an airline because patient is also sharp.  Another positive patient wanted to bring up I think helpful for us  working together with the sense of finding more intelligence on the planet able to open up discussion about how exciting that is how it relates to animals problem solving around things that they need to problem solve positive information to talk about.  Therapist shared about in perfect spirituality when patient mention does not liking to fail during one of her dreams therapist suggested it is okay for not perfect in this book may help us  okay if we get angry if we have feelings we just have them and work through them.  Assess mood enhances for both therapist and patient talking about various subjects that enhance mood can make us  laugh one of the best aspects of session.      Suicidal/Homicidal: No  Plan: Return again in 3 weeks.2.  Work on patient's depression work through issues from the past, coping  Diagnosis: Mood disorder in conditions classified elsewhere, Generalized  anxiety disorder, severe alcohol use  disorder  Collaboration of Care: Other none needed  Patient/Guardian was advised Release of Information must be obtained prior to any record release in order to collaborate their care with an outside provider. Patient/Guardian was advised if they have not already done so to contact the registration department to sign all necessary forms in order for us  to release information regarding their care.   Consent: Patient/Guardian gives verbal consent for treatment and assignment of benefits for services provided during this visit. Patient/Guardian expressed understanding and agreed to proceed.   Dallie Duel, LCSW 12/06/2023

## 2023-12-08 ENCOUNTER — Encounter: Admitting: Physical Therapy

## 2023-12-08 DIAGNOSIS — M25561 Pain in right knee: Secondary | ICD-10-CM | POA: Diagnosis not present

## 2023-12-08 DIAGNOSIS — M25562 Pain in left knee: Secondary | ICD-10-CM | POA: Diagnosis not present

## 2023-12-22 ENCOUNTER — Ambulatory Visit (HOSPITAL_COMMUNITY): Admitting: Psychiatry

## 2023-12-26 ENCOUNTER — Encounter (HOSPITAL_BASED_OUTPATIENT_CLINIC_OR_DEPARTMENT_OTHER): Payer: Self-pay | Admitting: Emergency Medicine

## 2023-12-26 ENCOUNTER — Emergency Department (HOSPITAL_BASED_OUTPATIENT_CLINIC_OR_DEPARTMENT_OTHER)

## 2023-12-26 ENCOUNTER — Emergency Department (HOSPITAL_BASED_OUTPATIENT_CLINIC_OR_DEPARTMENT_OTHER): Admission: EM | Admit: 2023-12-26 | Discharge: 2023-12-26 | Disposition: A | Discharge status: Home / Self Care

## 2023-12-26 ENCOUNTER — Other Ambulatory Visit: Payer: Self-pay

## 2023-12-26 ENCOUNTER — Ambulatory Visit: Payer: Self-pay

## 2023-12-26 ENCOUNTER — Ambulatory Visit
Admission: EM | Admit: 2023-12-26 | Discharge: 2023-12-26 | Disposition: A | Discharge status: Other Acute Inpatient Hospital

## 2023-12-26 DIAGNOSIS — N200 Calculus of kidney: Secondary | ICD-10-CM | POA: Diagnosis not present

## 2023-12-26 DIAGNOSIS — Z7901 Long term (current) use of anticoagulants: Secondary | ICD-10-CM

## 2023-12-26 DIAGNOSIS — R1084 Generalized abdominal pain: Secondary | ICD-10-CM

## 2023-12-26 DIAGNOSIS — Z7902 Long term (current) use of antithrombotics/antiplatelets: Secondary | ICD-10-CM | POA: Insufficient documentation

## 2023-12-26 DIAGNOSIS — K5730 Diverticulosis of large intestine without perforation or abscess without bleeding: Secondary | ICD-10-CM | POA: Diagnosis not present

## 2023-12-26 DIAGNOSIS — K921 Melena: Secondary | ICD-10-CM

## 2023-12-26 DIAGNOSIS — Z79899 Other long term (current) drug therapy: Secondary | ICD-10-CM | POA: Insufficient documentation

## 2023-12-26 DIAGNOSIS — K922 Gastrointestinal hemorrhage, unspecified: Secondary | ICD-10-CM | POA: Diagnosis not present

## 2023-12-26 DIAGNOSIS — I10 Essential (primary) hypertension: Secondary | ICD-10-CM | POA: Insufficient documentation

## 2023-12-26 DIAGNOSIS — K449 Diaphragmatic hernia without obstruction or gangrene: Secondary | ICD-10-CM | POA: Diagnosis not present

## 2023-12-26 DIAGNOSIS — R109 Unspecified abdominal pain: Secondary | ICD-10-CM | POA: Diagnosis not present

## 2023-12-26 DIAGNOSIS — R059 Cough, unspecified: Secondary | ICD-10-CM | POA: Insufficient documentation

## 2023-12-26 DIAGNOSIS — R051 Acute cough: Secondary | ICD-10-CM

## 2023-12-26 LAB — COMPREHENSIVE METABOLIC PANEL WITH GFR
ALT: 16 U/L (ref 0–44)
AST: 25 U/L (ref 15–41)
Albumin: 4.4 g/dL (ref 3.5–5.0)
Alkaline Phosphatase: 99 U/L (ref 38–126)
Anion gap: 14 (ref 5–15)
BUN: 13 mg/dL (ref 8–23)
CO2: 23 mmol/L (ref 22–32)
Calcium: 9.5 mg/dL (ref 8.9–10.3)
Chloride: 101 mmol/L (ref 98–111)
Creatinine, Ser: 0.67 mg/dL (ref 0.44–1.00)
GFR, Estimated: >60 mL/min (ref >60)
Glucose, Bld: 114 mg/dL — ABNORMAL HIGH (ref 70–99)
Potassium: 3.4 mmol/L — ABNORMAL LOW (ref 3.5–5.1)
Sodium: 139 mmol/L (ref 135–145)
Total Bilirubin: 0.6 mg/dL (ref 0.0–1.2)
Total Protein: 7 g/dL (ref 6.5–8.1)

## 2023-12-26 LAB — CBC
HCT: 39.3 % (ref 36.0–46.0)
Hemoglobin: 14 g/dL (ref 12.0–15.0)
MCH: 32.2 pg (ref 26.0–34.0)
MCHC: 35.6 g/dL (ref 30.0–36.0)
MCV: 90.3 fL (ref 80.0–100.0)
Platelets: 190 10*3/uL (ref 150–400)
RBC: 4.35 MIL/uL (ref 3.87–5.11)
RDW: 13 % (ref 11.5–15.5)
WBC: 6.3 10*3/uL (ref 4.0–10.5)
nRBC: 0 % (ref 0.0–0.2)

## 2023-12-26 LAB — OCCULT BLOOD X 1 CARD TO LAB, STOOL: Fecal Occult Bld: POSITIVE — AB

## 2023-12-26 LAB — RESP PANEL BY RT-PCR (RSV, FLU A&B, COVID)  RVPGX2
Influenza A by PCR: NEGATIVE
Influenza B by PCR: NEGATIVE
Resp Syncytial Virus by PCR: NEGATIVE
SARS Coronavirus 2 by RT PCR: NEGATIVE

## 2023-12-26 MED ORDER — IOHEXOL 300 MG/ML  SOLN
100.0000 mL | Freq: Once | INTRAMUSCULAR | Status: AC | PRN
  Administered 2023-12-26: 100 mL via INTRAVENOUS

## 2023-12-26 MED ORDER — PANTOPRAZOLE SODIUM 20 MG PO TBEC: 20.0000 mg | DELAYED_RELEASE_TABLET | Freq: Two times a day (BID) | ORAL | 0 refills | Status: DC

## 2023-12-26 MED ORDER — SUCRALFATE 1 G PO TABS: 1.0000 g | ORAL_TABLET | Freq: Two times a day (BID) | ORAL | 0 refills | Status: DC

## 2023-12-26 NOTE — Discharge Instructions (Addendum)
 Please go to the nearest ER immediately for further workup and evaluation.

## 2023-12-26 NOTE — ED Triage Notes (Signed)
 Pt POV shuffling gait-  reports hx of hematemesis. Reports black stool x3 days.  Abd pain x1 week. URI x2 weeks. Poor po intake x2 weeks. Generalized weakness.   Denies urinary sx.

## 2023-12-26 NOTE — ED Provider Notes (Signed)
 Patient presents to urgent care for multiple complaints.  She has had cough and congestion for the last 2.5 weeks.  Additionally reports black/tarry stools for the last 3 to 4 days.  History of irritable bowel syndrome, states she has never had bleeding to the intestinal tract.  History of hematemesis but states she has never had blood or mucus to the stools.  She has not seen any bright red blood in her stool or the toilet water.  Complains of intermittent dizziness with standing, nausea without vomiting, decreased appetite, and generalized abdominal cramping associated with tarry stools as well as constipation.  She takes Plavix  daily without missed doses for atrial fibrillation.  I would like for her to proceed to the nearest emergency room for further workup and evaluation to rule out GI bleed, intestinal obstruction, etc.  Her vital signs are hemodynamically stable, she is neurologically intact at baseline, she may proceed to the nearest emergency room by private car.  Discussed risks of deferring ER visit to which she expresses understanding and agreement with plan.  Patient discharged from urgent care in stable condition to the emergency room.   Starlene Eaton, [place] 12/26/23 1614

## 2023-12-26 NOTE — ED Triage Notes (Addendum)
 Has been sick for 2.5 weeks. Has c/o wet cough, nasal congestion, phlegm, feeling fatigued. Also needs a doctor note for work. Unsure of fever. No otc meds. Black stool for past couple of days. Has had abd pain for a while as well.

## 2023-12-26 NOTE — ED Notes (Signed)
 Patient is being discharged from the Urgent Care and sent to the Emergency Department via pov . Per stanhope np, patient is in need of higher level of care due to black tarry stools. Patient is aware and verbalizes understanding of plan of care.  Vitals:   12/26/23 1538  BP: (!) 155/92  Pulse: 100  Resp: 18  Temp: 99.9 F (37.7 C)  SpO2: 96%

## 2023-12-26 NOTE — Discharge Instructions (Addendum)
 Your workup today was reassuring.  There was blood noted in your stool, but no obvious source of bleeding on exam or your CT scan.  You did not have any drops in your blood counts.  Please follow-up with the GI doctor below within the next 2 weeks for follow-up.  You have been prescribed Protonix  and Carafate.  Please take these as prescribed.  Discontinue taking your Nexium .  Your COVID, flu, RSV testing was negative.  Return to the ER for any dizziness, weakness, worsening abdominal pain, any other new or concerning symptoms

## 2023-12-26 NOTE — Telephone Encounter (Signed)
  Chief Complaint: cough, black stool Symptoms: abd pain, cough, yellow sputum, black stool Frequency: about 3 weeks Pertinent Negatives: Patient denies fever,  Disposition: [] ED /[x] Urgent Care (no appt availability in office) / [] Appointment(In office/virtual)/ []  English Virtual Care/ [] Home Care/ [] Refused Recommended Disposition /[] [place] [facility]/ []  Follow-up with PCP Additional Notes: Pt states that she needs a work note and that she would like to be checked. Pt has been sick for 2-3 weeks with cough as well as weakness. Pt states that sputum is yellow. + head congestion. + SOB. Pt states that she has had fatigue. No appts in clinic, advised to utilize UC.  Copied from CRM [id number]. Topic: Clinical - Red Word Triage >> Dec 26, 2023  2:32 PM Brittney F wrote: [facility] that prompted transfer to Nurse Triage:    upper respiratory virus; chest infection; feeling very weak;   Current symptoms: Upper chest breathing trouble; congestion; feeling weak; stomach upset; pain in abdomen; cough  Started: 3 weeks ago  Patient has not been seen by a provider due to cost. Patient assumed it would go away but it did not. Patient main concern is that she would like a note just explaining the seriousness of her condition for her job so she doesn't lose employment Reason for Disposition  [1] MILD difficulty breathing (e.g., minimal/no SOB at rest, SOB with walking, pulse <100) AND [2] still present when not coughing  Answer Assessment - Initial Assessment Questions 1. ONSET: "When did the cough begin?"      3 weeks 2. SEVERITY: "How bad is the cough today?"      moderate 3. SPUTUM: "Describe the color of your sputum" (none, dry cough; clear, white, yellow, green)     yellow 4. HEMOPTYSIS: "Are you coughing up any blood?" If so ask: "How much?" (flecks, streaks, tablespoons, etc.)     denies 5. DIFFICULTY BREATHING: "Are you having difficulty breathing?" If Yes, ask: "How bad is it?" (e.g.,  mild, moderate, severe)    - MILD: No SOB at rest, mild SOB with walking, speaks normally in sentences, can lie down, no retractions, pulse < 100.    - MODERATE: SOB at rest, SOB with minimal exertion and prefers to sit, cannot lie down flat, speaks in phrases, mild retractions, audible wheezing, pulse 100-120.    - SEVERE: Very SOB at rest, speaks in single words, struggling to breathe, sitting hunched forward, retractions, pulse > 120      moderate 6. FEVER: "Do you have a fever?" If Yes, ask: "What is your temperature, how was it measured, and when did it start?"     Unsure, doesn't have thermometer 7. CARDIAC HISTORY: "Do you have any history of heart disease?" (e.g., heart attack, congestive heart failure)      2 stents,  8. LUNG HISTORY: "Do you have any history of lung disease?"  (e.g., pulmonary embolus, asthma, emphysema)     denies 10. OTHER SYMPTOMS: "Do you have any other symptoms?" (e.g., runny nose, wheezing, chest pain)       Head congestion, chest congestion, dark stools, abd pain 12. TRAVEL: "Have you traveled out of the country in the last month?" (e.g., travel history, exposures)       denies  Protocols used: Cough - Acute Productive-A-AH

## 2023-12-26 NOTE — Telephone Encounter (Signed)
 Patient is requesting to est care with a new provider since Dr. Dianah Fort is no longer here. Thanks in advance.

## 2023-12-26 NOTE — ED Provider Notes (Signed)
 [facility] Provider Note   CSN: [id number] Arrival date & time: 12/26/23  1638     History  Chief Complaint  Patient presents with   Melena    Lindsay [name] is a 68 y.o. female with significant past medical history of A-fib on clopidogrel , hypertension, presents with concern for change in stool color for the past 3 days.  Reports that initially her stool was a black color, then had a normal-appearing stool, and then had another black appearing stool this morning.  She denies any bright red blood in her stool.  Denies any weakness or dizziness. She does report having more constipation than normal has has strained when using the bathroom. She is on clopidogrel , but denies any NSAID use.  Also reporting some epigastric abdominal pain for the past 3 days.  Reports this is constant and does not worsen with any movement or food intake.  She denies any dysuria, hematuria, increased frequency.  Also reports a productive cough and congestion that has been ongoing for the past 2 weeks, but gradually improving.  Denies any fevers or shortness of breath.  HPI     Home Medications Prior to Admission medications   Medication Sig Start Date End Date Taking? Authorizing Provider  pantoprazole  (PROTONIX ) 20 MG tablet Take 1 tablet (20 mg total) by mouth 2 (two) times daily. 12/26/23 01/25/24 Yes Rexie Catena, PA-C  sucralfate (CARAFATE) 1 g tablet Take 1 tablet (1 g total) by mouth 2 (two) times daily. 12/26/23 01/25/24 Yes Rexie Catena, PA-C  acetaminophen  (TYLENOL ) 325 MG tablet Take 2 tablets (650 mg total) by mouth every 6 (six) hours as needed for mild pain (or Fever >/= 101). 12/19/19   Armenta Landau, MD  amLODipine  (NORVASC ) 2.5 MG tablet Take 1 tablet (2.5 mg total) by mouth daily. 11/07/23   Maudine Sos, MD  azelastine  (OPTIVAR ) 0.05 % ophthalmic solution INSTILL TWO DROPS IN EACH EYE TWICE DAILY 06/07/23   Breeback, Jade L, PA-C  B Complex-C  (SUPER B COMPLEX PO) Take by mouth.    [provider]  BIOTIN PO Take 1 tablet by mouth daily.    [provider]  buPROPion  ER (WELLBUTRIN  SR) 100 MG 12 hr tablet Take 1 tablet (100 mg total) by mouth 2 (two) times daily. 09/29/23   Wray Heady, MD  Cholecalciferol  20 MCG (800 UNIT) TABS Take 1 tablet by mouth daily. 10/28/22   Josepha Nickels, DO  clopidogrel  (PLAVIX ) 75 MG tablet Take 1 tablet (75 mg total) by mouth daily. 02/15/23   Maudine Sos, MD  Cyanocobalamin  (B-12 PO) Take 1 tablet by mouth daily.    [provider]  cyclobenzaprine  (FLEXERIL ) 5 MG tablet TAKE ONE TABLET BY MOUTH AT BEDTIME 04/19/23   Alyne Jules S, DO  diclofenac Sodium (VOLTAREN) 1 % GEL Apply topically as needed.    [provider]  esomeprazole  (NEXIUM  24HR CLEAR MINIS) 20 MG capsule Take 1 capsule (20 mg total) by mouth daily before breakfast. 12/19/19   Armenta Landau, MD  esomeprazole  (NEXIUM ) 40 MG capsule One tab by mouth at dinner time. 10/21/23   Breeback, Jade L, PA-C  Evolocumab  (REPATHA  SURECLICK) 140 MG/ML SOAJ INJECT ONE PEN INTO THE SKIN EVERY 14 DAYS 11/07/23   Hilty, Aviva Lemmings, MD  fluticasone  (FLONASE ) 50 MCG/ACT nasal spray Place 2 sprays into both nostrils daily. 10/21/23   Breeback, Jade L, PA-C  gabapentin  (NEURONTIN ) 100 MG capsule Take 1 capsule (100 mg  total) by mouth 2 (two) times daily. 09/29/23   Wray Heady, MD  HYDROcodone -acetaminophen  (NORCO/VICODIN) 5-325 MG tablet Take 2 tablets by mouth as needed for moderate pain.    [provider]  losartan  (COZAAR ) 50 MG tablet Take 1 tablet (50 mg total) by mouth daily. 04/01/23   Josepha Nickels, DO  metoprolol  tartrate (LOPRESSOR ) 25 MG tablet TAKE ONE-HALF TABLET BY MOUTH TWICE A DAY 02/07/23   Hilty, Aviva Lemmings, MD  nitroGLYCERIN  (NITROSTAT ) 0.4 MG SL tablet Place 1 tablet (0.4 mg total) under the tongue as needed for chest pain. 11/17/21   Clearnce Curia, NP  Omega-3 Fatty Acids (OMEGA 3 PO) Take  1 capsule by mouth daily.    [provider]  Plant Sterols and Stanols (CHOLESTOFF PLUS PO) Take by mouth daily.    [provider]  Probiotic Product (PROBIOTIC ADVANCED PO) Take by mouth daily.    [provider]      Allergies    Sulfa antibiotics, Ciprofloxacin , Depakote [divalproex sodium], Pine, Risperidone, Seroquel [quetiapine fumarate], Statins, Tramadol, Citalopram, Lamotrigine, Other, Topiramate, Wasp venom, and Zetia  [ezetimibe ]    Review of Systems   Review of Systems  Constitutional:  Negative for fever.  Gastrointestinal:  Positive for abdominal pain.    Physical Exam Updated Vital Signs BP 137/86 (BP Location: Left Arm)   Pulse 87   Temp 97.9 F (36.6 C)   Resp 20   Ht 5\' 2"  (1.575 m)   Wt 78 kg   SpO2 100%   BMI 31.46 kg/m  Physical Exam Vitals and nursing note reviewed. Exam conducted with a chaperone present.  Constitutional:      General: She is not in acute distress.    Appearance: She is well-developed.  HENT:     Head: Normocephalic and atraumatic.     Mouth/Throat:     Mouth: Mucous membranes are moist.     Pharynx: No oropharyngeal exudate or posterior oropharyngeal erythema.  Eyes:     Conjunctiva/sclera: Conjunctivae normal.  Cardiovascular:     Rate and Rhythm: Normal rate and regular rhythm.     Heart sounds: No murmur heard. Pulmonary:     Effort: Pulmonary effort is normal. No respiratory distress.     Breath sounds: Normal breath sounds.     Comments: Wet sounding cough on exam. Lungs clear to auscultation bilaterally.  Talking in full sentences on room air without difficulty Abdominal:     Palpations: Abdomen is soft.     Tenderness: There is abdominal tenderness.     Comments: Mild tenderness to palpation in the epigastric region and left lower quadrant.  No rebound or guarding  Genitourinary:    Comments: RN Alex present for rectal exam Patient with no external or internal hemorrhoids noted on exam.   Stool brown appearing, no gross melena or bright red blood. Musculoskeletal:        General: No swelling.     Cervical back: Neck supple.  Skin:    General: Skin is warm and dry.     Capillary Refill: Capillary refill takes less than 2 seconds.  Neurological:     Mental Status: She is alert.  Psychiatric:        Mood and Affect: Mood normal.     ED Results / Procedures / Treatments   Labs (all labs ordered are listed, but only abnormal results are displayed) Labs Reviewed  COMPREHENSIVE METABOLIC PANEL WITH GFR - Abnormal; Notable for the following components:  Result Value   Potassium 3.4 (*)    Glucose, Bld 114 (*)    All other components within normal limits  OCCULT BLOOD X 1 CARD TO LAB, STOOL - Abnormal; Notable for the following components:   Fecal Occult Bld POSITIVE (*)    All other components within normal limits  RESP PANEL BY RT-PCR (RSV, FLU A&B, COVID)  RVPGX2  CBC  POC OCCULT BLOOD, ED    EKG None  Radiology CT ABDOMEN PELVIS W CONTRAST Result Date: 12/26/2023 CLINICAL DATA:  Acute nonlocalized abdominal pain.  Melena EXAM: CT ABDOMEN AND PELVIS WITH CONTRAST TECHNIQUE: Multidetector CT imaging of the abdomen and pelvis was performed using the standard protocol following bolus administration of intravenous contrast. RADIATION DOSE REDUCTION: This exam was performed according to the departmental dose-optimization program which includes automated exposure control, adjustment of the mA and/or kV according to patient size and/or use of iterative reconstruction technique. CONTRAST:  OMNIPAQUE  IOHEXOL  300 MG/ML  SOLN COMPARISON:  04/19/2022 FINDINGS: Lower chest: No acute abnormality.  Moderate hiatal hernia Hepatobiliary: Moderate hepatic steatosis. No enhancing intrahepatic mass. No intra or extrahepatic biliary ductal dilation. Gallbladder unremarkable. Pancreas: Unremarkable Spleen: Unremarkable Adrenals/Urinary Tract: Adrenal glands are unremarkable. Kidneys  are normal in size and position. 3 mm nonobstructing calculus noted within the lower pole the left kidney. The kidneys are otherwise unremarkable. Bladder unremarkable. Stomach/Bowel: Pancolonic diverticulosis, severe within the sigmoid colon. Stomach, small bowel, and large bowel are otherwise unremarkable. Appendix absent. No evidence of obstruction or focal inflammation. No free intraperitoneal gas or fluid. Vascular/Lymphatic: No significant vascular findings are present. No enlarged abdominal or pelvic lymph nodes. Reproductive: Densely calcified involuted exophytic uterine fibroid arises from the left body of the uterus. The pelvic organs are otherwise unremarkable. Other: No abdominal wall hernia. Musculoskeletal: No acute bone abnormality. No lytic or blastic bone lesion. Osseous structures are age appropriate. IMPRESSION: 1. No acute intra-abdominal pathology identified. No definite radiographic explanation for the patient's reported symptoms. 2. Moderate hiatal hernia. 3. Moderate hepatic steatosis. 4. Minimal nonobstructing left nephrolithiasis. 5. Pancolonic diverticulosis, severe within the sigmoid colon. No superimposed active inflammatory change. Electronically Signed   By: Worthy Heads M.D.   On: 12/26/2023 21:23    Procedures Procedures    Medications Ordered in ED Medications  iohexol  (OMNIPAQUE ) 300 MG/ML solution 100 mL (100 mLs Intravenous Contrast Given 12/26/23 2057)    ED Course/ Medical Decision Making/ A&P Clinical Course as of 12/26/23 2157  Mon Dec 26, 2023  1957 Dr. Stanton Earthly with GI consulted, recommended BID PPI, Carafate, as long at CT looks ok. He recommended having her call office to schedule follow up [AF]    Clinical Course User Index [AF] Rexie Catena, PA-C                                 Medical Decision Making Amount and/or Complexity of Data Reviewed Labs: ordered. Radiology: ordered.  Risk Prescription drug management.     Differential  diagnosis includes but is not limited to COVID, flu, RSV, pneumonia, pneumothorax, hemorrhoid, diverticular bleed, upper GI bleed  ED Course:  Upon initial evaluation, patient is well-appearing, no acute distress.  Stable vitals.  Mild tenderness palpation in the left upper quadrant with exam, but no rebound or guarding.  On rectal exam performed with RN chaperone present, patient without any hemorrhoids noted.  No melanotic stool or bright red blood noted on exam.  Labs Ordered:  I Ordered, and personally interpreted labs.  The pertinent results include:   CBC within normal limits, hemoglobin within normal limits CMP within normal limits COVID, flu, RSV negative Hemoccult positive  Imaging Studies ordered: I ordered imaging studies including CT abdomen pelvis I independently visualized the imaging with scope of interpretation limited to determining acute life threatening conditions related to emergency care. Imaging showed no acute abnormalities I agree with the radiologist interpretation   Consultations Obtained: I requested consultation with the GI Dr. Veronda Goody,  and discussed lab and imaging findings as well as pertinent plan - they recommend: Having patient take a twice daily PPI, adding Carafate, and outpatient GI follow-up with their office.  He recommended patient call their office to schedule an appointment.   Upon re-evaluation, patient remains well-appearing, stable vitals.  Given CT scan unremarkable, no drop in hemoglobin, and no profuse melena or bright red blood on rectal exam, no dizziness or weakness, GI recommended outpatient follow-up for patient.  She is not currently established with a GI doctor.  Will have her follow-up with Dr. Veronda Goody outpatient.  Regarding patient's cough and congestion, her flu, COVID, and RSV testing was negative.  Lungs clear to auscultation bilaterally, no fevers, and she reports symptoms are improving.  Low concern for pneumonia at this time,  do not feel she needs chest x-ray at this time.  Will continue with symptomatic treatment at home.  Stable and appropriate for discharge home    Impression: Positive Hemoccult  Disposition:  The patient was discharged home with instructions to take Protonix  and Carafate as prescribed.  Follow-up with Dr. Veronda Goody within the next 2 weeks, his contact information was provided. Return precautions given.    Record Review: External records from outside source obtained and reviewed including medication records, patient on clopidogrel  for A-fib     This chart was dictated using voice recognition software, Dragon. Despite the best efforts of this provider to proofread and correct errors, errors may still occur which can change documentation meaning.          Final Clinical Impression(s) / ED Diagnoses Final diagnoses:  Gastrointestinal hemorrhage, unspecified gastrointestinal hemorrhage type    Rx / DC Orders ED Discharge Orders          Ordered    pantoprazole  (PROTONIX ) 20 MG tablet  2 times daily        12/26/23 2153    sucralfate (CARAFATE) 1 g tablet  2 times daily        12/26/23 2153              Rexie Catena, PA-C 12/26/23 2157    Carin Charleston, MD 12/26/23 2246

## 2023-12-27 ENCOUNTER — Ambulatory Visit (INDEPENDENT_AMBULATORY_CARE_PROVIDER_SITE_OTHER): Admitting: Licensed Clinical Social Worker

## 2023-12-27 DIAGNOSIS — F0630 Mood disorder due to known physiological condition, unspecified: Secondary | ICD-10-CM | POA: Diagnosis not present

## 2023-12-27 DIAGNOSIS — F1020 Alcohol dependence, uncomplicated: Secondary | ICD-10-CM | POA: Diagnosis not present

## 2023-12-27 DIAGNOSIS — F411 Generalized anxiety disorder: Secondary | ICD-10-CM | POA: Diagnosis not present

## 2023-12-27 NOTE — Progress Notes (Signed)
 THERAPIST PROGRESS NOTE  Session Time: 2:00 PM to 2:50 PM  Participation Level: Active  Behavioral Response: CasualAlertAnxious and Depressed  Type of Therapy: Individual Therapy  Treatment Goals addressed: work on Pharmacologist for anxiety depression to decrease symptoms, work on mood management, mood stabilization, coping in general, patient is collaterally managing substance use issues through AA, coping, address significant stressor of state of her house and home repair is really necessary   ProgressTowards Goals: Progressing-patient experiencing significant stressors helping her process emotions in session help with coping is also focusing on helpful coping  Interventions: Solution Focused, Strength-based, Supportive, and Other: Coping  Summary: Lindsay [name] is a 68 y.o. female who presents with bleeding in the upper and lower intestine concerned, hiatal hernia haven't eaten in last couple of days. Giving her something more aggressive to coat and help with irritation. Fear is the mind killer. Feeling weak, helpless and despairing, helpless. Sick body losing control helpless nobody there to help you, fear of loss of job sick for so long. Brown phlegm they think might have had pneumonia has since 14 intestinal problem due to stress. Had an endoscopy last month. Think ADHD. Gravitate to job luck and talent sit at desk draw right side and listen to books on tape for left brain calm enough.  Noted this being an aspect of ADHD reviewed how she has been processing the past sees her mom always has a hero Dad not there a lot working not exposure to every day routine didn't appreciate what she did always come home on weekends.  Therapist pointed out minimalization looking at things from different points of use that she follow that course on her own for her own healing patient also thinks more compassion and empathy has helped with her healing. Own experiences as well and failure. Compassion and empathy  forgive father for a lot of things that he did that hurt her deeply. Make her afraid, insecure, diminished, better understanding and shares memories Mom always so much more present, reliable. Dad reliable until he fell into depression. Dad's twin brother was bipolar.  Sees her dad wouldn't take risk, fear of failure, do the least challenging thing so didn't look bad a shame Contractor, Tree surgeon, scholarship [employer] of [employer]. He threw it away moved out last place residence threw everything away.  Talked about her love of Fausto Hooker as well as positive memories of dad. Showed therapist a dynamic through physical gestures that thinks of him pure joy. Georgie loved.  Patient describes in session more emotional because sick talking about these things shared when 3 rescued him.   Reviewed session therapist pointing out which she got was patient processing history of past and making some progress at the same time patient pointed out at this point despairing afraid, reminder herself fear is the mind killer. House falling apart, no money exhausted, too exhausted to organize house to bring back to what used to, trying to figure out a way to know [employer]. Thinking do something what people used to see if people interested put on Facebook.   Patient has been sick past weeks has escalated mental health symptoms describing feelings like helpless hopeless fearful.  Therapist guided patient in a perspective to help her with coping including a lot of things we do not have control of that includes her body strategy is to be guided by doctors and follow their recommendations as the best approach.  Same time validating patient on managing stress and getting sick when older.  We both emphasize importance of following up with care from doctors with significant symptoms patient following her own advice.  Looked at achievements from therapy overall something to feel good about being able to change how she feels about the past  related both to compassion and empathy open to different perspective of the past her own experience is helping with that.  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  Suicidal/Homicidal: No  Plan: Return again in 1 weeks.2. Work on patient's depression work through issues from the past, coping  Diagnosis:  Mood disorder in conditions classified elsewhere, Generalized  anxiety disorder, severe alcohol use  disorder  Collaboration of Care: Other none needed  Patient/Guardian was advised Release of Information must be obtained prior to any record release in order to collaborate their care with an outside provider. Patient/Guardian was advised if they have not already done so to contact the registration department to sign all necessary forms in order for us  to release information regarding their care.   Consent: Patient/Guardian gives verbal consent for treatment and assignment of benefits for services provided during this visit. Patient/Guardian expressed understanding and agreed to proceed.   Dallie Duel, LCSW 12/27/2023

## 2024-01-03 ENCOUNTER — Ambulatory Visit (INDEPENDENT_AMBULATORY_CARE_PROVIDER_SITE_OTHER): Admitting: Licensed Clinical Social Worker

## 2024-01-03 DIAGNOSIS — F0630 Mood disorder due to known physiological condition, unspecified: Secondary | ICD-10-CM

## 2024-01-03 DIAGNOSIS — F411 Generalized anxiety disorder: Secondary | ICD-10-CM | POA: Diagnosis not present

## 2024-01-03 DIAGNOSIS — F1020 Alcohol dependence, uncomplicated: Secondary | ICD-10-CM | POA: Diagnosis not present

## 2024-01-03 NOTE — Progress Notes (Signed)
 Virtual Visit via Video Note  I connected with Roberta Angell on 01/03/24 at  1:00 PM EDT by a video enabled telemedicine application and verified that I am speaking with the correct person using two identifiers.  Location: Patient: home Provider: office   I discussed the limitations of evaluation and management by telemedicine and the availability of in person appointments. The patient expressed understanding and agreed to proceed.   I discussed the assessment and treatment plan with the patient. The patient was provided an opportunity to ask questions and all were answered. The patient agreed with the plan and demonstrated an understanding of the instructions.   The patient was advised to call back or seek an in-person evaluation if the symptoms worsen or if the condition fails to improve as anticipated.  I provided 55 minutes of non-face-to-face time during this encounter.  THERAPIST PROGRESS NOTE  Session Time: 2:00 PM to 2:55 PM  Participation Level: Active  Behavioral Response: CasualAlertDepressed  Type of Therapy: Individual Therapy  Treatment Goals addressed: work on coping skills for anxiety depression to decrease symptoms, work on mood management, mood stabilization, coping in general, patient is collaterally managing substance use issues through AA, coping, address significant stressor of state of her house and home repair is really necessary   ProgressTowards Goals: Progressing-patient sick also had a recent episode that upset her help to process thoughts and feelings therapist used reframing to realize she made a mistake and not something for her to carry but to correct the record is all she can do if they hold it against her think about whether that says something about them  Interventions: Solution Focused, Strength-based, Supportive, Reframing, and Other: coping  Summary: Laneice Meneely is a 68 y.o. female who presents with finding out with bankruptcy ended 2027 is it good or  bad not sure. Feels like giving up. Nice to connect with somebody. (This therapist) Have been feeling suicidal did get back to work pushed herself not in jeopardy with work. Worked this week and working two more days. Good news didn't have COVID something else. Active issues discovered  intestinal issues so changing the protocol trying to heal the intestine. Whole life had issues recently vomiting blood not passing stools. More detailed regimen regarding covering intestinal lining with other drugs more focused on that rather then Nexium  on 2 other regiments that take place every 12 hours instead of 24 hours. Contact with people over the internet lost a friend misinterpreted patient. Tried to establish a relationship with neighbor making a new friend yet to meet. While sick juggling different conversations and it crossed over took comment said I hate you which didn't mean. Therapist said knows wouldn't say something like that. Was actually was saying. shifting between three conversations in the middle of the night. Met her through managers at the [employer] so a power situation. Supposed to get together but got sick. Manager a black lady love her so judgmental. Grateful to meet this new human being get together with garden shut off from her now. It was Elizabeth-friend who would like, Andrea-manager at the [employer]. These are people involved. Cain Castillo would have been friend like very much but she doesn't like patient. The black lady who talks fast doesn't listen. Wisely not scheduled with her. Cain Castillo friends with Nellie Banas. So hopeful and happy meet new friend in neighborhood. Because sick texting to somebody about something else confused 3 AM and sick.  Inputting the narrative together there different pieces patient interjected doesn't care  about Cain Castillo she is "bitch" and can't do anything about that. Aggravates her making new friend about gardening and goats and she step in and destroyed that. Up late and  mistyped you and instead of him. Misunderstanding on three levels. Don't want to let go want friendship with person who patient was valuing. Cain Castillo called her out on I hate you. She sent a text didn't finish reading text she is crazy didn't finish not when not feeling well.  Better now with being sick. Grateful to have Nellie Banas to talk to. Would never say that, what is wrong with woman expect that of patient to say I hate you . Probably let the whole thing go which is shame first time in years so right now breaking her heart at a deep level. Really sad about this. Sent a text and not sure if coming through to [place]. Breaking her heart. Don't think will be able to clear up haven't heard back from her. "That bitch woman she was mistreating me at work" couldn't make complaints a Production designer, theatre/television/film of her at work. No longer see at work think Production designer, theatre/television/film stepped in so don't see each other. Lovely man who is gay older he is Psychologist, educational he is good guy and taking care of things. Juggling things. Calming down as speak not crying had been crying through whole thing. Therapist said not her fault just to correct the record. Cared about Nellie Banas talked to her two hours. Wanted to meet goats. So patient in middle of the night "sick as a dog" three text lines and also going to protest march. How to explain patient tried. The black lady in the middle of it all and she doesn't like patient. Patient said not worth it but still bothering her. Therapist noted on them to recognize it is accident patient realizes but she can't go over it. Maybe bring a flower as a token of saying sorry up to you to accept or not. And draw something.  Test 12 out of 13 for ADHD interested and think profile that way. Wants to get tested maybe a medicine and help focus. All over the board and can't focus on anything. Couldn't do back in the day. Organize life detrimental effect ability to survive and dealt with this a long time. If doesn't resolve this don't  resolve existential problem problem with focus and organize suffering from it. Past found ways of managing it. When came menopausal brain chemistry change that led to depression more and felt overwhelmed. Still am and decades later. Therapist noted seeing the why and identifying is a step toward working on it. Menopause "fucked her over". She was suicidal all the time desperate 01-22-03. Suddenly needed to go on vacation and spend too much money. Desperately sad. This was a crucial time. Deeply said.  Patient wants more time for emergency session therapist suggest a helpline not going to do it needs someone who knows her. Identify issues 01-22-03 was on [place] thinking about killing herself and plans. Insight about life issues therapist points out will help her get out. Looking at why so sad renovated house and didn't work felt as failure.  Therapist noted the chemical aspect and patient said for 1 she was menopausal.  What got from today. Glad to share her sorrow about the loss of a potential friend. Still sick to stomach. .  Notes with manager cultural crossroads and therapist said more about her. Pick the ones that value you. Thinking about going to [employer] because  of Leo ritual patterns can inform a life. Structure loss looking at her end of life makes her sad. Talked to people about this end of life with customers therapist pointed out where thrive. Destiny a young black woman with a new baby, Field seismologist these two make sure Cain Castillo and her don't cross past. Wants to say something to [place] but think he knows appreciates him.  Patient does say feel better from talking about it at end of session       Reviewed session patient said feeling sad about loss of friendship and it helps to talk to therapist therapist use reframing to say she is not sure she is lost the friendship and she did not do anything wrong that needs to feel responsible it was an accident and therapist opinion is if people are not  forgiving about accidents then to reevaluate the friendship.  We looked at specifically person who may be interfering and recognizing probably the reason she has her own issues and again not patient's fault responsibility with somebody else.  We looked at ADHD that could help with her symptoms but was positive patient notices it as she gets disorganized overwhelmed and we both agree to continue with that therapist said this may help address for her to get better may need to happen.  Therapist looked at [employer] likes her manager did not lose her job therapist challenged patient in her thinking when sick can feel negative and depressed but not to take that is accurate therapist herself when sick does not have positive thoughts a lot so expect that and not believe those thoughts.  Therapist utilize session for patient to process thoughts and feelings in session. Suicidal/Homicidal: No  Plan: Return again in 1 week.2.  Look at ADHD look at difficult situations where patient struggles to cope continue to process the past sense of understanding help with putting pieces together will help with coping  Diagnosis: Mood disorder in conditions classified elsewhere, Generalized  anxiety disorder, severe alcohol use  disorder  Collaboration of Care: Other none needed  Patient/Guardian was advised Release of Information must be obtained prior to any record release in order to collaborate their care with an outside provider. Patient/Guardian was advised if they have not already done so to contact the registration department to sign all necessary forms in order for us  to release information regarding their care.   Consent: Patient/Guardian gives verbal consent for treatment and assignment of benefits for services provided during this visit. Patient/Guardian expressed understanding and agreed to proceed.   Dallie Duel, LCSW 01/03/2024

## 2024-01-08 ENCOUNTER — Other Ambulatory Visit: Payer: Self-pay

## 2024-01-08 ENCOUNTER — Ambulatory Visit
Admission: RE | Admit: 2024-01-08 | Discharge: 2024-01-08 | Disposition: A | Discharge status: Home / Self Care | Source: Ambulatory Visit | Attending: Internal Medicine | Admitting: Internal Medicine

## 2024-01-08 VITALS — BP 119/82 | HR 76 | Temp 98.8°F | Resp 18

## 2024-01-08 DIAGNOSIS — W5501XA Bitten by cat, initial encounter: Secondary | ICD-10-CM | POA: Diagnosis not present

## 2024-01-08 DIAGNOSIS — S61451A Open bite of right hand, initial encounter: Secondary | ICD-10-CM

## 2024-01-08 MED ORDER — AMOXICILLIN-POT CLAVULANATE 875-125 MG PO TABS: 1.0000 | ORAL_TABLET | Freq: Two times a day (BID) | ORAL | 0 refills | Status: AC

## 2024-01-08 MED ORDER — CEFTRIAXONE SODIUM 1 G IJ SOLR
1000.0000 mg | Freq: Once | INTRAMUSCULAR | Status: AC
  Administered 2024-01-08: 1000 mg via INTRAMUSCULAR

## 2024-01-08 NOTE — ED Triage Notes (Addendum)
 Patient presents to Urgent Care with complaints of cat bite of right index finger since 1 day ago. Patient reports redness and swelling of the finger. Cat is up to date with vaccinations. Has cleaned and soaked the finger.

## 2024-01-08 NOTE — ED Provider Notes (Signed)
 [facility]    CSN: [id number] Arrival date & time: 01/08/24  1442      History   Chief Complaint Chief Complaint  Patient presents with   Finger Injury    Animal bite, severe infection/ only 24 hours - Entered by patient   Animal Bite    HPI Lindsay [name] is a 68 y.o. female.   68 year old female who presents urgent care with complaints of a cat bite to her right index finger.  She reports that this happened yesterday.  This was her own personal cat who is an indoor cat and is up-to-date on vaccinations.  Initially she washed the area well and soaked it in water with salt.  She was concerned today because the area is very red and swollen and seems to be getting worse.  She has had a severe cat bite in the past that resulted in sepsis.  She is up-to-date on her tetanus with the last one being in May 2021.  She denies any fevers or chills at current.     Animal Bite Associated symptoms: no fever and no rash     Past Medical History:  Diagnosis Date   Abnormal mammogram    Thick Tissue   Acquired trigger finger of right ring finger 04/20/2022   Arthritis    Bilateral wrist pain 04/20/2022   Bipolar 1 disorder    Cataract    OU   Cervical radiculopathy 10/14/2021   Chalazion of left lower eyelid 04/15/2022   Chest pain 02/11/2015   Chronic atrial fibrillation 09/08/2015   Colon polyp    Compressed cervical disc    Coronary artery disease involving native coronary artery with angina pectoris 03/24/2015   Dorsalgia 09/22/2018   Dysphagia 09/22/2018   Essential hypertension 03/03/2015   Generalized anxiety disorder    GERD without esophagitis 09/22/2018   Hematemesis 12/18/2019   Hepatic steatosis 04/22/2022   History of appendectomy 1963   Hypertensive retinopathy    OU   Irritable bowel syndrome without diarrhea 09/22/2018   Lactic acidosis 12/18/2019   LLQ pain 04/15/2022   Major depressive disorder    Migraine without status migrainosus, not  intractable 09/22/2018   Mixed hyperlipidemia 05/07/2016   Non-ST elevation MI (NSTEMI) 07/26/2008   No cardiac catheterization at the time.   Obstructive sleep apnea    no CPAP use   Orthostasis    Osteoarthritis of carpometacarpal (CMC) joint of thumb 04/20/2022   Osteoarthritis of knee 09/22/2018   PAF (paroxysmal atrial fibrillation) 10/09/2018   Pain in joint of right hip 07/03/2020   Pain in right knee 12/14/2018   Recurrent syncope    Rib pain 07/03/2020   Sciatica of right side 09/22/2018   Severe alcohol use disorder    Vitamin D  deficiency     Patient Active Problem List   Diagnosis Date Noted   B12 deficiency 10/24/2023   Seasonal allergies 10/21/2023   Gastroesophageal reflux disease without esophagitis 10/21/2023   Epigastric pain 10/21/2023   Stress 02/02/2023   Insomnia 12/23/2022   Blood clotting disorder (HCC) 10/28/2022   Neck pain 10/27/2022   Acute thoracic back pain 10/27/2022   Lumbar radiculopathy 05/26/2022   Degeneration of lumbar intervertebral disc 05/26/2022   Major depressive disorder    Severe alcohol use disorder    Generalized anxiety disorder    Obstructive sleep apnea    Hepatic steatosis 04/22/2022   Osteoarthritis of carpometacarpal (CMC) joint of thumb 04/20/2022   Acquired trigger finger of right  ring finger 04/20/2022   Bilateral wrist pain 04/20/2022   Chalazion of left lower eyelid 04/15/2022   Bipolar 1 disorder (HCC)    Cervical radiculopathy 10/14/2021   Pain in joint of right hip 07/03/2020   Rib pain 07/03/2020   Recurrent syncope    Orthostasis    Lactic acidosis 12/18/2019   Hematemesis 12/18/2019   Pain in right knee 12/14/2018   PAF (paroxysmal atrial fibrillation) 10/09/2018   Dorsalgia 09/22/2018   Dysphagia 09/22/2018   GERD without esophagitis 09/22/2018   Irritable bowel syndrome without diarrhea 09/22/2018   Osteoarthritis of knee 09/22/2018   Polypharmacy 09/22/2018   Sciatica of right side 09/22/2018    Vitamin D  deficiency 09/22/2018   Mixed hyperlipidemia 05/07/2016   Chronic atrial fibrillation (HCC) 09/08/2015   Coronary artery disease involving native coronary artery with angina pectoris 03/24/2015   Essential hypertension 03/03/2015   Chest pain 02/11/2015   Non-ST elevation MI (NSTEMI) (HCC) 07/26/2008    Past Surgical History:  Procedure Laterality Date   APPENDECTOMY     CARDIAC CATHETERIZATION N/A 03/25/2015   Procedure: Left Heart Cath and Coronary Angiography;  Surgeon: Arleen Lacer, MD;  Location: [facility] INVASIVE CV LAB;  Service: Cardiovascular;  Laterality: N/A;   CORONARY STENT INTERVENTION N/A 10/09/2018   Procedure: CORONARY STENT INTERVENTION;  Surgeon: Lucendia Rusk, MD;  Location: [facility] INVASIVE CV LAB;  Service: Cardiovascular;  Laterality: N/A;   DILATATION & CURETTAGE/HYSTEROSCOPY WITH MYOSURE N/A 05/19/2016   Procedure: DILATATION & CURETTAGE/HYSTEROSCOPY WITH MYOSURE;  Surgeon: Johnn Najjar, MD;  Location: [facility];  Service: Gynecology;  Laterality: N/A;  Possible Myosure for polyps.   EYE SURGERY     LEFT HEART CATH AND CORONARY ANGIOGRAPHY N/A 10/09/2018   Procedure: LEFT HEART CATH AND CORONARY ANGIOGRAPHY;  Surgeon: Lucendia Rusk, MD;  Location: [facility] INVASIVE CV LAB;  Service: Cardiovascular;  Laterality: N/A;   MOUTH SURGERY     URETHRAL DILATION      OB History   No obstetric history on file.      Home Medications    Prior to Admission medications   Medication Sig Start Date End Date Taking? Authorizing Provider  acetaminophen  (TYLENOL ) 325 MG tablet Take 2 tablets (650 mg total) by mouth every 6 (six) hours as needed for mild pain (or Fever >/= 101). 12/19/19  Yes Armenta Landau, MD  amLODipine  (NORVASC ) 2.5 MG tablet Take 1 tablet (2.5 mg total) by mouth daily. 11/07/23  Yes Maudine Sos, MD  amoxicillin -clavulanate (AUGMENTIN ) 875-125 MG tablet Take 1 tablet by mouth every 12 (twelve) hours for 10 days. 01/08/24 01/18/24 Yes Erianna Jolly,  Dalissa Lovin A, PA-C  azelastine  (OPTIVAR ) 0.05 % ophthalmic solution INSTILL TWO DROPS IN EACH EYE TWICE DAILY 06/07/23  Yes Breeback, Jade L, PA-C  B Complex-C (SUPER B COMPLEX PO) Take by mouth.   Yes [provider]  BIOTIN PO Take 1 tablet by mouth daily.   Yes [provider]  buPROPion  ER (WELLBUTRIN  SR) 100 MG 12 hr tablet Take 1 tablet (100 mg total) by mouth 2 (two) times daily. 09/29/23  Yes Wray Heady, MD  Cholecalciferol  20 MCG (800 UNIT) TABS Take 1 tablet by mouth daily. 10/28/22  Yes Dianah Fort, Erika S, DO  clopidogrel  (PLAVIX ) 75 MG tablet Take 1 tablet (75 mg total) by mouth daily. 02/15/23  Yes Maudine Sos, MD  Cyanocobalamin  (B-12 PO) Take 1 tablet by mouth daily.   Yes [provider]  cyclobenzaprine  (FLEXERIL ) 5 MG tablet TAKE ONE TABLET  BY MOUTH AT BEDTIME 04/19/23  Yes Wachs, Erika S, DO  diclofenac Sodium (VOLTAREN) 1 % GEL Apply topically as needed.   Yes [provider]  esomeprazole  (NEXIUM  24HR CLEAR MINIS) 20 MG capsule Take 1 capsule (20 mg total) by mouth daily before breakfast. 12/19/19  Yes Armenta Landau, MD  esomeprazole  (NEXIUM ) 40 MG capsule One tab by mouth at dinner time. 10/21/23  Yes Breeback, Jade L, PA-C  Evolocumab  (REPATHA  SURECLICK) 140 MG/ML SOAJ INJECT ONE PEN INTO THE SKIN EVERY 14 DAYS 11/07/23  Yes Hilty, Aviva Lemmings, MD  fluticasone  (FLONASE ) 50 MCG/ACT nasal spray Place 2 sprays into both nostrils daily. 10/21/23  Yes Breeback, Jade L, PA-C  gabapentin  (NEURONTIN ) 100 MG capsule Take 1 capsule (100 mg total) by mouth 2 (two) times daily. 09/29/23  Yes Wray Heady, MD  HYDROcodone -acetaminophen  (NORCO/VICODIN) 5-325 MG tablet Take 2 tablets by mouth as needed for moderate pain.   Yes [provider]  losartan  (COZAAR ) 50 MG tablet Take 1 tablet (50 mg total) by mouth daily. 04/01/23  Yes Alyne Jules S, DO  metoprolol  tartrate (LOPRESSOR ) 25 MG tablet TAKE ONE-HALF TABLET BY MOUTH TWICE A DAY 02/07/23  Yes  Hilty, Aviva Lemmings, MD  nitroGLYCERIN  (NITROSTAT ) 0.4 MG SL tablet Place 1 tablet (0.4 mg total) under the tongue as needed for chest pain. 11/17/21  Yes Clearnce Curia, NP  Omega-3 Fatty Acids (OMEGA 3 PO) Take 1 capsule by mouth daily.   Yes [provider]  pantoprazole  (PROTONIX ) 20 MG tablet Take 1 tablet (20 mg total) by mouth 2 (two) times daily. 12/26/23 01/25/24 Yes Rexie Catena, PA-C  Plant Sterols and Stanols (CHOLESTOFF PLUS PO) Take by mouth daily.   Yes [provider]  Probiotic Product (PROBIOTIC ADVANCED PO) Take by mouth daily.   Yes [provider]  sucralfate  (CARAFATE ) 1 g tablet Take 1 tablet (1 g total) by mouth 2 (two) times daily. 12/26/23 01/25/24 Yes Rexie Catena, PA-C    Family History Family History  Problem Relation Age of Onset   Vascular Disease Mother    Dementia Mother    Hypertension Mother    Stroke Mother    Depression Mother    Dementia Father    Heart attack Father    Stroke Father    Heart attack Maternal Grandmother    Heart attack Maternal Grandfather    Kidney failure Paternal Grandmother    Heart attack Paternal Grandfather     Social History Social History   Tobacco Use   Smoking status: Never   Smokeless tobacco: Never  Vaping Use   Vaping status: Never Used  Substance Use Topics   Alcohol use: Yes    Comment: hx of severe alcohol abuse; most recent admission 02/2022   Drug use: Never     Allergies   Sulfa antibiotics, Ciprofloxacin , Depakote [divalproex sodium], Pine, Risperidone, Seroquel [quetiapine fumarate], Statins, Tramadol, Citalopram, Lamotrigine, Other, Topiramate, Wasp venom, and Zetia  [ezetimibe ]   Review of Systems Review of Systems  Constitutional:  Negative for chills and fever.  HENT:  Negative for ear pain and sore throat.   Eyes:  Negative for pain and visual disturbance.  Respiratory:  Negative for cough and shortness of breath.   Cardiovascular:  Negative for chest pain and  palpitations.  Gastrointestinal:  Negative for abdominal pain and vomiting.  Genitourinary:  Negative for dysuria and hematuria.  Musculoskeletal:  Negative for arthralgias and back pain.  Skin:  Positive for color change and wound.  Negative for rash.  Neurological:  Negative for seizures and syncope.  All other systems reviewed and are negative.    Physical Exam Triage Vital Signs ED Triage Vitals [01/08/24 1511]  Encounter Vitals Group     BP      Girls Systolic BP Percentile      Girls Diastolic BP Percentile      Boys Systolic BP Percentile      Boys Diastolic BP Percentile      Pulse      Resp      Temp      Temp src      SpO2      Weight      Height      Head Circumference      Peak Flow      Pain Score 4     Pain Loc      Pain Education      Exclude from Growth Chart    No data found.  Updated Vital Signs BP 119/82 (BP Location: Right Arm)   Pulse 76   Temp 98.8 F (37.1 C) (Oral)   Resp 18   SpO2 98%   Visual Acuity Right Eye Distance:   Left Eye Distance:   Bilateral Distance:    Right Eye Near:   Left Eye Near:    Bilateral Near:     Physical Exam Vitals and nursing note reviewed.  Constitutional:      General: She is not in acute distress.    Appearance: She is well-developed.  HENT:     Head: Normocephalic and atraumatic.   Eyes:     Conjunctiva/sclera: Conjunctivae normal.    Cardiovascular:     Rate and Rhythm: Normal rate and regular rhythm.     Heart sounds: No murmur heard. Pulmonary:     Effort: Pulmonary effort is normal. No respiratory distress.     Breath sounds: Normal breath sounds.  Abdominal:     Palpations: Abdomen is soft.     Tenderness: There is no abdominal tenderness.   Musculoskeletal:        General: No swelling.       Hands:     Cervical back: Neck supple.   Skin:    General: Skin is warm and dry.     Capillary Refill: Capillary refill takes less than 2 seconds.   Neurological:     Mental Status:  She is alert.   Psychiatric:        Mood and Affect: Mood normal.      UC Treatments / Results  Labs (all labs ordered are listed, but only abnormal results are displayed) Labs Reviewed - No data to display  EKG   Radiology No results found.  Procedures Procedures (including critical care time)  Medications Ordered in UC Medications  cefTRIAXone (ROCEPHIN) injection 1,000 mg (1,000 mg Intramuscular Given 01/08/24 1529)    Initial Impression / Assessment and Plan / UC Course  I have reviewed the triage vital signs and the nursing notes.  Pertinent labs & imaging results that were available during my care of the patient were reviewed by me and considered in my medical decision making (see chart for details).     Cat bite of right hand, initial encounter   Cat bite to the right index finger with signs of cellulitis.  We will treat this with both injectable antibiotics as well as antibiotics by mouth.  We will treat with the following: Ceftriaxone 1 g injection given today.  This is an antibiotic. Starting tomorrow 6/16: Augmentin  875/125 mg twice daily for 10 days.  This is an antibiotic.  Take this with food. Continue to wash and clean the area twice daily Watch for worsening signs of infection including fever, chills, increasing redness, increasing swelling or increasing pain. Return to urgent care or PCP if symptoms worsen or fail to resolve.    Final Clinical Impressions(s) / UC Diagnoses   Final diagnoses:  Cat bite of right hand, initial encounter     Discharge Instructions      Cat bite to the right index finger with signs of cellulitis.  We will treat this with both injectable antibiotics as well as antibiotics by mouth.  We will treat with the following: Ceftriaxone 1 g injection given today.  This is an antibiotic. Starting tomorrow 6/16: Augmentin  875/125 mg twice daily for 10 days.  This is an antibiotic.  Take this with food. Continue to wash and clean  the area twice daily Watch for worsening signs of infection including fever, chills, increasing redness, increasing swelling or increasing pain. Return to urgent care or PCP if symptoms worsen or fail to resolve.       ED Prescriptions     Medication Sig Dispense Auth. Provider   amoxicillin -clavulanate (AUGMENTIN ) 875-125 MG tablet Take 1 tablet by mouth every 12 (twelve) hours for 10 days. 20 tablet Kreg Pesa, [place]      PDMP not reviewed this encounter.   Kreg Pesa, [place] 01/08/24 1532

## 2024-01-08 NOTE — Discharge Instructions (Addendum)
 Cat bite to the right index finger with signs of cellulitis.  We will treat this with both injectable antibiotics as well as antibiotics by mouth.  We will treat with the following: Ceftriaxone 1 g injection given today.  This is an antibiotic. Starting tomorrow 6/16: Augmentin  875/125 mg twice daily for 10 days.  This is an antibiotic.  Take this with food. Continue to wash and clean the area twice daily Watch for worsening signs of infection including fever, chills, increasing redness, increasing swelling or increasing pain. Return to urgent care or PCP if symptoms worsen or fail to resolve.

## 2024-01-09 ENCOUNTER — Ambulatory Visit (INDEPENDENT_AMBULATORY_CARE_PROVIDER_SITE_OTHER): Admitting: Licensed Clinical Social Worker

## 2024-01-09 DIAGNOSIS — F411 Generalized anxiety disorder: Secondary | ICD-10-CM | POA: Diagnosis not present

## 2024-01-09 DIAGNOSIS — F1020 Alcohol dependence, uncomplicated: Secondary | ICD-10-CM

## 2024-01-09 DIAGNOSIS — F0630 Mood disorder due to known physiological condition, unspecified: Secondary | ICD-10-CM | POA: Diagnosis not present

## 2024-01-09 NOTE — Progress Notes (Signed)
 THERAPIST PROGRESS NOTE  Session Time: 1:00 PM to 1:52 PM  Participation Level: Active  Behavioral Response: CasualAlertDepressed a couple bright spots in session  Type of Therapy: Individual Therapy  Treatment Goals addressed: work on Pharmacologist for anxiety depression to decrease symptoms, work on mood management, mood stabilization, coping in general, patient is collaterally managing substance use issues through AA, coping, address significant stressor of state of her house and home repair is really necessary  ProgressTowards Goals: Progressing-assess helpful for patient to go back and process some dark times helping her with processing memory, additionally patient experiencing stressors helpful to have a supportive environment to process feelings in session and identify helpful attitudes and coping that help with stressors  Interventions: Solution Focused, Strength-based, Supportive, and Other: coping  Summary: Lindsay [name] is a 68 y.o. female who presents with inflamed intestines over a month not well. So much wrong hard to walk and bend knees. Prednisone  helped a lot with that for week and half but lowered immune system. She had upper respiratory inflection. Vomiting blood endoscopy a couple of months looked fine. Thought was an episode. Edge of crying feeling weak and that impacts depression. Dark thoughts. Lost a lot of work finally catching up financially waiting for her write up meeting at work. Recovered enough to go back to work but a development started vomiting blood. Back and spent 8 hours at ER last week, what going on the digestive track is worse now taking something to coat her stomach, doctor says do what can heal a likely tear could take 4-6 months. If doesn't clear up another endoscopy. Not well first day back only stayed an hour next day pushed her through. [name] got sick bit her finger back tooth right into joint of forefinger.  Trying to go to bed earlier ate half if good  dinner, actually could taste the food last night haven't been able to in two months. Trying to not fall into internal sucking pad of IPAD at night get bad researching and then brain doesn't go to sleep. Strategy is to look at something struggle to grasp or boring had has vibrator for bed that helps her to sleep.  Today dizzy and tired hard to get to counselor's office.  Sceptic tank still backing up every time look something else wrong.   2003-01-18 when depression to get bad started and refurbished house then fell apart. First bout 01-18-00 [name] died out for a month. Late 90's depression episodes that were serious. January 18, 2003 really going into menopause really went to a dark period. Not began to drink until 30's didn't respect people he drank, not in culture and disposition.  She does not understand deep darkness went into again 3 years later always working then house fixed then things wrong, then again things wrong not back to back. Finally things done thought enjoy art and gardening. Remember 2003-01-18 ruminating about this started haunt her ideas of dying ideas of suicide, toss thoughts out but it would go back. Remembers the experience while sitting at her drawing board. Decided to see psychiatrist. Had anxiety prescribed Xanax. By 01/18/04 doing stupid things started to worry [name]. Booking vacation all Jan 18, 2004. Testing her financially. Cool Play hobby and then obsession. Doing stupid things increased Xanax didn't know connection with alcohol put herself in hospital December banging head on floor, had a gun, suicidal notes. When it started to get bad.  One good thing ten minutes pick a shelf start to pull things what decided to  do. Making serious decisions about what matters to her.  Wants to find places that will sell nice furniture so she is getting herself connected and working on it.  Therapist processing thoughts and feelings in session with patient she is had a difficult place exacerbated by medical issues that then  impact her mental health.  Therapist provided some framing to see that the medical issues are impacting her mood understanding the source helps with coping.  Patient concerned about medical health helpful to have a place an outlet to talk about that in a supportive and strength-based environment therapist noting without her doubt her body is tired and worn out by being sick for couple months.  We are able to find some highlights during the session that made patient smile was positive that it was not all gloomy.  We talked about when her mental health got back in 2004 helpful to process that with therapist for support helpful for therapeutic processing.  Therapist feedback was it sounded like she was having a manic episode okays.  Patient was able 10 with a highlight think that would be good to do regularly a good thing has a really good suggestion for procrastination and feeling overwhelmed with things therapist like so much she wants to apply a way to approach things that will not be overwhelming. Therapist offered emotional support, active listening and validation of patient's emotional experience as appropriate during session.  Suicidal/Homicidal: No  Plan: Return again in 1 week.2.  Continue to process issues from the past continue to work on current coping for stressors  Diagnosis: Mood disorder in conditions classified elsewhere, Generalized  anxiety disorder, severe alcohol use  disorder  Collaboration of Care: Other none needed  Patient/Guardian was advised Release of Information must be obtained prior to any record release in order to collaborate their care with an outside provider. Patient/Guardian was advised if they have not already done so to contact the registration department to sign all necessary forms in order for us  to release information regarding their care.   Consent: Patient/Guardian gives verbal consent for treatment and assignment of benefits for services provided during this  visit. Patient/Guardian expressed understanding and agreed to proceed.   Dallie Duel, LCSW 01/09/2024

## 2024-01-10 ENCOUNTER — Ambulatory Visit (INDEPENDENT_AMBULATORY_CARE_PROVIDER_SITE_OTHER): Admitting: Psychiatry

## 2024-01-10 ENCOUNTER — Ambulatory Visit (HOSPITAL_COMMUNITY): Admitting: Psychiatry

## 2024-01-10 ENCOUNTER — Encounter (HOSPITAL_COMMUNITY): Payer: Self-pay | Admitting: Psychiatry

## 2024-01-10 DIAGNOSIS — F0630 Mood disorder due to known physiological condition, unspecified: Secondary | ICD-10-CM | POA: Diagnosis not present

## 2024-01-10 DIAGNOSIS — F1020 Alcohol dependence, uncomplicated: Secondary | ICD-10-CM

## 2024-01-10 DIAGNOSIS — F411 Generalized anxiety disorder: Secondary | ICD-10-CM

## 2024-01-10 NOTE — Progress Notes (Signed)
 [facility] Follow up visit  Patient Identification: Lindsay [name] MRN:  [id number] Date of Evaluation:  01/10/2024 Referral Source: primary care Chief Complaint:   No chief complaint on file. Depression, anxiety , alcohol use Visit Diagnosis:    ICD-10-CM   1. Mood disorder in conditions classified elsewhere  F06.30     2. Alcohol use disorder, moderate, dependence (HCC)  F10.20     3. Generalized anxiety disorder  F41.1       History of Present Illness: Patient is a 68 years old currently single Caucasian female initailly  referred by primary care physician to establish care for history of bipolar, depression and anxiety.  Patient has been in treatment many years ago but has not been taking any medication for more than 10 years and regarding to her bipolar or depression  On social security and working part time at [employer]  On eval had some medical co morbidities flared up including back condition and being evaluated for GI bleed  So not working as much Trying to de focus from worries and in therapy Has remains sober off alcohol  Aggravating factor: difficult childhood and conflicts with  dad, finances,  alcohol use history  Modifying factor: friend, cats  Duration adult life  Severity: manageable  Hospital admission multiple more so related with alcohol use ; detox and depression    Past Psychiatric History: depression, bipolar   Previous Psychotropic Medications: Yes  Multiple, including risperdal, lamictal  Substance Abuse History in the last 12 months:  Yes.    Consequences of Substance Abuse: Medical Consequences:  discussed effect of alcohol to depression, judjement  Past Medical History:  Past Medical History:  Diagnosis Date   Abnormal mammogram    Thick Tissue   Acquired trigger finger of right ring finger 04/20/2022   Arthritis    Bilateral wrist pain 04/20/2022   Bipolar 1 disorder    Cataract    OU   Cervical radiculopathy 10/14/2021   Chalazion of left  lower eyelid 04/15/2022   Chest pain 02/11/2015   Chronic atrial fibrillation 09/08/2015   Colon polyp    Compressed cervical disc    Coronary artery disease involving native coronary artery with angina pectoris 03/24/2015   Dorsalgia 09/22/2018   Dysphagia 09/22/2018   Essential hypertension 03/03/2015   Generalized anxiety disorder    GERD without esophagitis 09/22/2018   Hematemesis 12/18/2019   Hepatic steatosis 04/22/2022   History of appendectomy 1963   Hypertensive retinopathy    OU   Irritable bowel syndrome without diarrhea 09/22/2018   Lactic acidosis 12/18/2019   LLQ pain 04/15/2022   Major depressive disorder    Migraine without status migrainosus, not intractable 09/22/2018   Mixed hyperlipidemia 05/07/2016   Non-ST elevation MI (NSTEMI) 07/26/2008   No cardiac catheterization at the time.   Obstructive sleep apnea    no CPAP use   Orthostasis    Osteoarthritis of carpometacarpal (CMC) joint of thumb 04/20/2022   Osteoarthritis of knee 09/22/2018   PAF (paroxysmal atrial fibrillation) 10/09/2018   Pain in joint of right hip 07/03/2020   Pain in right knee 12/14/2018   Recurrent syncope    Rib pain 07/03/2020   Sciatica of right side 09/22/2018   Severe alcohol use disorder    Vitamin D  deficiency     Past Surgical History:  Procedure Laterality Date   APPENDECTOMY     CARDIAC CATHETERIZATION N/A 03/25/2015   Procedure: Left Heart Cath and Coronary Angiography;  Surgeon: Arleen Lacer,  MD;  Location: [facility] INVASIVE CV LAB;  Service: Cardiovascular;  Laterality: N/A;   CORONARY STENT INTERVENTION N/A 10/09/2018   Procedure: CORONARY STENT INTERVENTION;  Surgeon: Lucendia Rusk, MD;  Location: [facility] INVASIVE CV LAB;  Service: Cardiovascular;  Laterality: N/A;   DILATATION & CURETTAGE/HYSTEROSCOPY WITH MYOSURE N/A 05/19/2016   Procedure: DILATATION & CURETTAGE/HYSTEROSCOPY WITH MYOSURE;  Surgeon: Johnn Najjar, MD;  Location: [facility] ORS;  Service: Gynecology;   Laterality: N/A;  Possible Myosure for polyps.   EYE SURGERY     LEFT HEART CATH AND CORONARY ANGIOGRAPHY N/A 10/09/2018   Procedure: LEFT HEART CATH AND CORONARY ANGIOGRAPHY;  Surgeon: Lucendia Rusk, MD;  Location: [facility] INVASIVE CV LAB;  Service: Cardiovascular;  Laterality: N/A;   MOUTH SURGERY     URETHRAL DILATION      Family Psychiatric History: cousin some mental illness, father possible depression   Family History:  Family History  Problem Relation Age of Onset   Vascular Disease Mother    Dementia Mother    Hypertension Mother    Stroke Mother    Depression Mother    Dementia Father    Heart attack Father    Stroke Father    Heart attack Maternal Grandmother    Heart attack Maternal Grandfather    Kidney failure Paternal Grandmother    Heart attack Paternal Grandfather     Social History:   Social History   Socioeconomic History   Marital status: Single    Spouse name: Not on file   Number of children: 0   Years of education: 16   Highest education level: Bachelor's degree (e.g., BA, AB, BS)  Occupational History   Not on file  Tobacco Use   Smoking status: Never   Smokeless tobacco: Never  Vaping Use   Vaping status: Never Used  Substance and Sexual Activity   Alcohol use: Yes    Comment: hx of severe alcohol abuse; most recent admission 02/2022   Drug use: Never   Sexual activity: Not Currently  Other Topics Concern   Not on file  Social History Narrative   Right handed   Drinks caffeine   Split level   Social Drivers of Health   Financial Resource Strain: High Risk (10/26/2022)   Overall Financial Resource Strain (CARDIA)    Difficulty of Paying Living Expenses: Hard  Food Insecurity: Food Insecurity Present (10/26/2022)   Hunger Vital Sign    Worried About Running Out of Food in the Last Year: Often true    Ran Out of Food in the Last Year: Sometimes true  Transportation Needs: Unmet Transportation Needs (10/26/2022)   PRAPARE - Transportation     Lack of Transportation (Medical): Yes    Lack of Transportation (Non-Medical): Yes  Physical Activity: Unknown (10/26/2022)   Exercise Vital Sign    Days of Exercise per Week: 0 days    Minutes of Exercise per Session: Not on file  Stress: Stress Concern Present (10/26/2022)   [employer] of Occupational Health - Occupational Stress Questionnaire    Feeling of Stress : Very much  Social Connections: Socially Isolated (10/26/2022)   Social Connection and Isolation Panel    Frequency of Communication with Friends and Family: More than three times a week    Frequency of Social Gatherings with Friends and Family: Once a week    Attends Religious Services: Never    Database administrator or Organizations: No    Attends Banker Meetings: Not on file  Marital Status: Never married    Additional Social History: grew up with parents, rough growing up or challenging with some conflicts with parents  Allergies:   Allergies  Allergen Reactions   Sulfa Antibiotics Swelling and Rash   Ciprofloxacin  Nausea And Vomiting   Depakote [Divalproex Sodium] Other (See Comments)    Reaction not recalled- it was 20 years ago   Pine Itching   Risperidone Other (See Comments)    Unknown reaction   Seroquel [Quetiapine Fumarate] Other (See Comments)    VIVID nightmares   Statins Other (See Comments)    joint pain, weakness   Tramadol Other (See Comments)    Serotonin syndrome   Citalopram Rash and Other (See Comments)    Insomnia and agitation also   Lamotrigine Itching, Rash and Other (See Comments)    Stevens-Johnson syndrome and Brown urine, also   Other Other (See Comments) and Cough    Cat dander   Topiramate Rash and Other (See Comments)    Nightmares, brown urine also   Wasp Venom Dermatitis    Edema and redness in area of wasp sting   Zetia  [Ezetimibe ]     Joint pain, weakness     Metabolic Disorder Labs: No results found for: HGBA1C, MPG No results found  for: PROLACTIN Lab Results  Component Value Date   CHOL 189 10/14/2023   TRIG 97 10/14/2023   HDL 66 10/14/2023   CHOLHDL 2.9 10/14/2023   VLDL 44 (H) 04/02/2016   LDLCALC 106 (H) 10/14/2023   LDLCALC 129 (H) 04/07/2023   Lab Results  Component Value Date   TSH 1.134 12/18/2019    Therapeutic Level Labs: No results found for: LITHIUM No results found for: CBMZ No results found for: VALPROATE  Current Medications: Current Outpatient Medications  Medication Sig Dispense Refill   acetaminophen  (TYLENOL ) 325 MG tablet Take 2 tablets (650 mg total) by mouth every 6 (six) hours as needed for mild pain (or Fever >/= 101).     amLODipine  (NORVASC ) 2.5 MG tablet Take 1 tablet (2.5 mg total) by mouth daily. 90 tablet 3   amoxicillin -clavulanate (AUGMENTIN ) 875-125 MG tablet Take 1 tablet by mouth every 12 (twelve) hours for 10 days. 20 tablet 0   azelastine  (OPTIVAR ) 0.05 % ophthalmic solution INSTILL TWO DROPS IN EACH EYE TWICE DAILY 6 mL 5   B Complex-C (SUPER B COMPLEX PO) Take by mouth.     BIOTIN PO Take 1 tablet by mouth daily.     buPROPion  ER (WELLBUTRIN  SR) 100 MG 12 hr tablet Take 1 tablet (100 mg total) by mouth 2 (two) times daily. 60 tablet 1   Cholecalciferol  20 MCG (800 UNIT) TABS Take 1 tablet by mouth daily. 75 tablet 0   clopidogrel  (PLAVIX ) 75 MG tablet Take 1 tablet (75 mg total) by mouth daily. 90 tablet 3   Cyanocobalamin  (B-12 PO) Take 1 tablet by mouth daily.     cyclobenzaprine  (FLEXERIL ) 5 MG tablet TAKE ONE TABLET BY MOUTH AT BEDTIME 30 tablet 0   diclofenac Sodium (VOLTAREN) 1 % GEL Apply topically as needed.     esomeprazole  (NEXIUM  24HR CLEAR MINIS) 20 MG capsule Take 1 capsule (20 mg total) by mouth daily before breakfast. 30 capsule 1   esomeprazole  (NEXIUM ) 40 MG capsule One tab by mouth at dinner time. 90 capsule 1   Evolocumab  (REPATHA  SURECLICK) 140 MG/ML SOAJ INJECT ONE PEN INTO THE SKIN EVERY 14 DAYS 2 mL 5   fluticasone  (FLONASE ) 50 MCG/ACT  nasal spray Place 2 sprays into both nostrils daily. 16 g 0   gabapentin  (NEURONTIN ) 100 MG capsule Take 1 capsule (100 mg total) by mouth 2 (two) times daily. 60 capsule 1   HYDROcodone -acetaminophen  (NORCO/VICODIN) 5-325 MG tablet Take 2 tablets by mouth as needed for moderate pain.     losartan  (COZAAR ) 50 MG tablet Take 1 tablet (50 mg total) by mouth daily. 90 tablet 2   metoprolol  tartrate (LOPRESSOR ) 25 MG tablet TAKE ONE-HALF TABLET BY MOUTH TWICE A DAY 90 tablet 3   nitroGLYCERIN  (NITROSTAT ) 0.4 MG SL tablet Place 1 tablet (0.4 mg total) under the tongue as needed for chest pain. 25 tablet 5   Omega-3 Fatty Acids (OMEGA 3 PO) Take 1 capsule by mouth daily.     pantoprazole  (PROTONIX ) 20 MG tablet Take 1 tablet (20 mg total) by mouth 2 (two) times daily. 60 tablet 0   Plant Sterols and Stanols (CHOLESTOFF PLUS PO) Take by mouth daily.     Probiotic Product (PROBIOTIC ADVANCED PO) Take by mouth daily.     sucralfate  (CARAFATE ) 1 g tablet Take 1 tablet (1 g total) by mouth 2 (two) times daily. 60 tablet 0   No current facility-administered medications for this visit.      Psychiatric Specialty Exam: Review of Systems  Cardiovascular:  Negative for chest pain.  Neurological:  Negative for tremors.  Psychiatric/Behavioral:  Negative for agitation and self-injury.     There were no vitals taken for this visit.There is no height or weight on file to calculate BMI.  General Appearance: Casual  Eye Contact:  Fair  Speech:  Clear and Coherent  Volume:  Normal  Mood: fair  Affect:  Constricted  Thought Process:  Goal Directed  Orientation:  oriented fully  Thought Content:  Rumination  Suicidal Thoughts:  No  Homicidal Thoughts:  No  Memory:  Immediate;   Fair  Judgement:  Fair  Insight:  Shallow  Psychomotor Activity:  Normal  Concentration:  Concentration: Fair  Recall:  Fair  Fund of Knowledge:Fair  Language: Fair  Akathisia:  No  Handed:    AIMS (if indicated):  no  involuntary movements  Assets:  Desire for Improvement Social Support  ADL's:  Intact  Cognition: WNL  Sleep:  Fair   Screenings: GAD-7    Flowsheet Row Office Visit from 10/27/2022 in [facility] Primary Care & [facility] at [facility] Office Visit from 06/29/2022 in [facility] Primary Care & Sports Medicine at [facility]  Total GAD-7 Score 5 19   PHQ2-9    Flowsheet Row Counselor from 05/10/2023 in [facility] at [facility] Office Visit from 12/30/2022 in [facility] at [facility] Office Visit from 11/04/2022 in [facility] Primary Care & Sports Medicine at [facility] Office Visit from 10/27/2022 in [facility] Primary Care & Sports Medicine at [facility] Office Visit from 06/29/2022 in [facility] Primary Care & Sports Medicine at [facility] Total Score 2 2 5 3 6   PHQ-9 Total Score 18 9 -- 15 21   Flowsheet Row UC from 01/08/2024 in [facility] at [place] Most recent reading at 01/08/2024  3:13 PM ED from 12/26/2023 in [facility] Emergency Department at [facility] Most recent reading at 12/26/2023  4:59 PM UC from 12/26/2023 in [facility] Urgent Care at [place] Most recent reading at 12/26/2023  3:44 PM  C-SSRS RISK CATEGORY No Risk No Risk  No Risk    Assessment and Plan: as follows  Prior documentation reviewed   Bipolar disorder ; current episode depression:  she considers recurrent depression and not be on gabapentin . Says it was causing some spasms She just wants to take wellbutrin  for depression and feels gets distracted without it and distraction may not be part of bipolar Has wellbutrin  for now Sobreity has helped keep her mood more balanced, still worries of international and global happenings.   GAD; fluctuates, worries related to medical health, she is following with providers, continue therapy    Alcohol use disorder; remains sober, continue relapse prevention  Sleep : manageable and not on trazadone, continue sleep hygiene   FU  2 - 6m  earlier if needed, recommend therapy Direct care time  20 minutes including chart review, face to face, documentation   Collaboration of Care: Primary Care Provider AEB reviewed notes and referral  Patient/Guardian was advised Release of Information must be obtained prior to any record release in order to collaborate their care with an outside provider. Patient/Guardian was advised if they have not already done so to contact the registration department to sign all necessary forms in order for us  to release information regarding their care.   Consent: Patient/Guardian gives verbal consent for treatment and assignment of benefits for services provided during this visit. Patient/Guardian expressed understanding and agreed to proceed.   Wray Heady, MD 6/17/20259:13 AM

## 2024-01-17 ENCOUNTER — Ambulatory Visit (INDEPENDENT_AMBULATORY_CARE_PROVIDER_SITE_OTHER): Admitting: Licensed Clinical Social Worker

## 2024-01-17 DIAGNOSIS — F1020 Alcohol dependence, uncomplicated: Secondary | ICD-10-CM

## 2024-01-17 DIAGNOSIS — F411 Generalized anxiety disorder: Secondary | ICD-10-CM

## 2024-01-17 DIAGNOSIS — F0630 Mood disorder due to known physiological condition, unspecified: Secondary | ICD-10-CM

## 2024-01-17 NOTE — Progress Notes (Signed)
 THERAPIST PROGRESS NOTE  Session Time: 3:00 PM to 3:52 PM  Participation Level: Active  Behavioral Response: CasualAlertEuthymic  Type of Therapy: Individual Therapy  Treatment Goals addressed: work on coping skills for anxiety depression to decrease symptoms, work on mood management, mood stabilization, coping in general, patient is collaterally managing substance use issues through AA, coping, address significant stressor of state of her house and home repair is really necessary  ProgressTowards Goals: Progressing-noted patient's perspective of the past is more nuanced better it processing noted opens up happiness and freedom also exploring spirituality another area that we will make her  Interventions: Solution Focused, Strength-based, Supportive, and Other: coping  Summary: Lindsay [name] is a 68 y.o. female who presents with talked about episodes in the past we touched on that at times in session it is a way for her to process this 1 was a severe 1 describes manic for months working many hours and writing a book.   Switched to another subject came back from college went to [place] to get a job employment when got back. Home and not have parents around mom got her own apartment tough. 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 Aunt Camie actually a genius Psychologist, educational studied in [place] exposed corruption in Conservation officer, nature.  Discussed patient's progress and therapist presented her perspective patient looks at the past and a more nuanced way patient says happier and a lot of freedom since that.our sessions which is around to different subjects 1 thing leads to another subject patient describes how she knows she is a genius how respond to events, bother to learn respond to what they think they now, sense of humor.  Therapist does think those are ways of noting people who think in strategies that show their smart Way better felt more herself was very sick now back to herself..  Always a spiritual person ever since  started dreaming. Trusted own insights since 68 years old. Analyze dreams since 68 years old.  Talked about memories that 18 months felt more dreamlike shared the exact memories in session what that was like. Trying to open up to mystery and not looking to solutions. Wait for it to come mind and brain creating meaning whatever question mark it head have to resolve nibbles picking at scab answer always in background Met organic gardener at [facility] talked about plants. Get number going to meet up at [employer]. Gardener stopped and said what a great job, a long time gardener too. Watch your for years so admiring what done. He gave her a tour this amazing garden he created. He agrees to look at her property what can do. Text [place] and said amazing man. She said know him neighbor open to things so these things happen. Shared this four times sharing because a teaching lesson if you are open to possibility and not making judgements can make these kind of experiences happen. He is working on developing a B&B so can go back to hiking. Doesn't trust anybody therapist noted why great trusted patient. Patient says empathetic how sees him. Wants to learn to work with him because want to. Going to pay $200 in cash work together doing certain things organic gardeners strip land area know what is there strip tree branches away.  [employer] patient and therapist all think a way to re-engage with garden, he is encouraging her to do that. He will do what can't do. Physical and injuries things can't do. What calls a spiritual moment these kind of  coincidences. Don't believe in conscious God watching over what we do, manipulating us  rules at beginning that we are moving into unknown future no control create paradigms illusion of power and control think God when can't think deeper than that. Look at convergences with different religions. Human survived communicate socialize share knowledge.  Patient looking at ways to explain  spirituality synchronicity It is there and be open to it if not not able to recognize it like Buddha says takes work to be open to it not easy let go of a lot of stuff. Stuff in personal history has to let go to be open and fearless. Open and willing to be hurt. Also benevolence. Getting by giving. A willingness. Grow into muscles open mind [employer] when started meditation not practice but practices awareness. Not staring at a wall, aware in a position watching thoughts listening to thoughts realize crazy you are. Stream of thoughts coming by aware then can work to stop it. Aware of everything around and in front what is really there. Experience with Norleen immediately recognize what it was open and not barrier of thoughts and moment to moment. Music making up by something else is making up music and you are listening and paying attention without judgment can have a reaction and joy keen know something that is going to hurt you as well.   Patient doing better physically mood is improved therapist input was positive to see her in a better place back more to herself.  Talked about concepts of spirituality her concepts and like some of the concepts a sense of letting go paying attention without judgment being their movement and movement, being open to experiences noting these are the qualities to be open to spiritual experience.  Therapist liked the analogy of music listening to music but having to pay attention that it is there.  Patient talked about meditation and noticing thoughts very much connected to mindfulness we noticed your thoughts so you can have better control of your thoughts.  Noted progress in treatment therapist explained from a therapy perspective minimalization what patient is doing and around having a more nuanced perspective of the past not so close then noting there is nuances to experiences noting for example how she can relate to some of the things her parents went through helping her to  understand better.  Therapist talked about memory reconsolidation noting as we remember a memory every time we can change it a little bit how we use that in therapy for brain change in a positive way does not erase the memory but can change her relationship to the memory assess patient is probably doing some of this in session as she talks about past experiences well.  Helpful for her to review her history both look good times the funny times the difficult times assesses therapist important aspect of her therapy to work on things for patient to share history having a therapeutic value in itself therapist provided support and space for patient to talk about thoughts and feelings in session.  Suicidal/Homicidal: No  Plan: Return again in 1 week.2.  Work on [employer] of the past encouraged patient with positive coping including exploring and implementing spirituality  Diagnosis: Mood disorder in conditions classified elsewhere, Generalized  anxiety disorder, severe alcohol use  disorder  Collaboration of Care: Medication Management AEB review of Dr. Geralene note  Patient/Guardian was advised Release of Information must be obtained prior to any record release in order to collaborate their care with  an outside provider. Patient/Guardian was advised if they have not already done so to contact the registration department to sign all necessary forms in order for us  to release information regarding their care.   Consent: Patient/Guardian gives verbal consent for treatment and assignment of benefits for services provided during this visit. Patient/Guardian expressed understanding and agreed to proceed.   Ronal Sink, LCSW 01/17/2024

## 2024-01-24 ENCOUNTER — Ambulatory Visit (INDEPENDENT_AMBULATORY_CARE_PROVIDER_SITE_OTHER): Admitting: Licensed Clinical Social Worker

## 2024-01-24 ENCOUNTER — Ambulatory Visit (HOSPITAL_COMMUNITY): Admitting: Licensed Clinical Social Worker

## 2024-01-24 DIAGNOSIS — F411 Generalized anxiety disorder: Secondary | ICD-10-CM | POA: Diagnosis not present

## 2024-01-24 DIAGNOSIS — F1020 Alcohol dependence, uncomplicated: Secondary | ICD-10-CM | POA: Diagnosis not present

## 2024-01-24 DIAGNOSIS — Z79899 Other long term (current) drug therapy: Secondary | ICD-10-CM | POA: Diagnosis not present

## 2024-01-24 DIAGNOSIS — M5412 Radiculopathy, cervical region: Secondary | ICD-10-CM | POA: Diagnosis not present

## 2024-01-24 DIAGNOSIS — M545 Low back pain, unspecified: Secondary | ICD-10-CM | POA: Diagnosis not present

## 2024-01-24 DIAGNOSIS — F0630 Mood disorder due to known physiological condition, unspecified: Secondary | ICD-10-CM | POA: Diagnosis not present

## 2024-01-24 DIAGNOSIS — M47812 Spondylosis without myelopathy or radiculopathy, cervical region: Secondary | ICD-10-CM | POA: Diagnosis not present

## 2024-01-24 DIAGNOSIS — Z5181 Encounter for therapeutic drug level monitoring: Secondary | ICD-10-CM | POA: Diagnosis not present

## 2024-01-24 DIAGNOSIS — M51369 Other intervertebral disc degeneration, lumbar region without mention of lumbar back pain or lower extremity pain: Secondary | ICD-10-CM | POA: Diagnosis not present

## 2024-01-24 NOTE — Progress Notes (Signed)
 THERAPIST PROGRESS NOTE  Session Time: 1:00 PM to 1:48 PM  Participation Level: Active  Behavioral Response: CasualAlertEuthymic  Type of Therapy: Individual Therapy  Treatment Goals addressed: work on coping skills for anxiety depression to decrease symptoms, work on mood management, mood stabilization, coping in general, patient is collaterally managing substance use issues through AA, coping, address significant stressor of state of her house and home repair is really necessary  ProgressTowards Goals: Progressing-things have improved for patient as a result patient mood has improved does not feel abandoned is much feels more hopeful both of us  enjoyed where she is at in this session  Interventions: Solution Focused, Strength-based, Supportive, and Other: Coping  Summary: Lindsay [name] is a 68 y.o. female who presents with someone came and helped her move things in garden couldn't move to get her going. Can get started needed inspiration to get out here. Understood her so fast language expressed well her purpose and goal.  This fellow Lonzell is well saying you needed to get inspired to come out and what his help seems to have helped her do. Patient says that went well and hopeful.  Another positive Niels agrees to pay for demolition of bathroom very positive so now has things has to do because of that. Has to drag things out of closet.  Talked about things she is going to focus on working on that include gardening, working on kitchen always, picking up hallway, problem has been physically ill. Came home yesterday and exhausted. Couldn't think. Mental blankness didn't know what did when got home 5 hours later woke up to feed the cats.  We brought up some fun topics in session patient said fun to plan where like to see and already seen.  Other than health nothing bothering her other than disappointment what didn't get done in house, worried about losing job but not now.  Good session last 30 days  was difficult she was in a hole mental lethargy, patient says had to be brain chemistry issues.  Now does not feel so abandoned so nothing feeding the attitude cannot do it what is the point, why bother, stay in bed. Recognizable depression feeds on itself More encouraging with help.  Noted the difference from the last 30 days and patient went back to talk about difficulties from the past 2004 got really bad bipolar depression and menopause almost killed her and drinking got serious  Assess good session today patient has been really sick for last 30 days in a dark cold and coming out of it.  Talked about not feeling so abandon which felt her symptoms, shared more the thought cannot do it herself so why bother feeding her depression making it worse.  Things help get her out feeling better going to work, getting help with things her garden getting help on her bathroom helped lift her out of depression and give her hope energy more positive mood.  Service enjoyed the session where she is doing better in a good mood.  We did review 2004 when it started and how difficult it was a combination of manic depression menopause and then started drinking since then having bouts to get broader perspective of patient's course of symptoms.  Session focused on different topics could include things patient wants to work on enthusiasm about projects different things learning all create a positive session that helps with mood. Suicidal/Homicidal: No  Plan: Return again in 1 week.2.  Work on current stressors past trauma  Diagnosis: Mood disorder  in conditions classified elsewhere, Generalized  anxiety disorder, severe alcohol use  disorder  Collaboration of Care: Other none needed  Patient/Guardian was advised Release of Information must be obtained prior to any record release in order to collaborate their care with an outside provider. Patient/Guardian was advised if they have not already done so to contact the registration  department to sign all necessary forms in order for us  to release information regarding their care.   Consent: Patient/Guardian gives verbal consent for treatment and assignment of benefits for services provided during this visit. Patient/Guardian expressed understanding and agreed to proceed.   Ronal Sink, LCSW 01/24/2024

## 2024-01-31 ENCOUNTER — Ambulatory Visit (INDEPENDENT_AMBULATORY_CARE_PROVIDER_SITE_OTHER): Admitting: Licensed Clinical Social Worker

## 2024-01-31 DIAGNOSIS — F411 Generalized anxiety disorder: Secondary | ICD-10-CM

## 2024-01-31 DIAGNOSIS — F0630 Mood disorder due to known physiological condition, unspecified: Secondary | ICD-10-CM | POA: Diagnosis not present

## 2024-01-31 DIAGNOSIS — F1020 Alcohol dependence, uncomplicated: Secondary | ICD-10-CM

## 2024-01-31 NOTE — Progress Notes (Signed)
 THERAPIST PROGRESS NOTE  Session Time: 8:00 AM to 8:45 AM  Participation Level: Active  Behavioral Response: CasualAlertappropriate in session but dealing with pain issues so does not feel well  Type of Therapy: Individual Therapy  Treatment Goals addressed: work on coping skills for anxiety depression to decrease symptoms, work on mood management, mood stabilization, coping in general, patient is collaterally managing substance use issues through AA, coping, address significant stressor of state of her house and home repair is really necessary  ProgressTowards Goals: Progressing-patient dealing with various stressors physical issues assess helpful for her to come to therapy session help her with uplifting mood processing thoughts and feelings in session  Interventions: Solution Focused, Strength-based, Supportive, and Other: coping  Summary: Lindsay [name] is a 68 y.o. female who presents with knees gave out at work.  Was happy to have extra work but then after 2 days not able to work after that had a call out.  She realizes healthy for her to get to work and engage with people ask about them gets her thoughts away from thinking about herself. Met someone knew some of the people patient knew from when went to [facility].  Not sleeping last two nights. Physically shaking so tired, being weak puts her in a bad state. Works next Sunday. Niels hiring someone to [employer] and and has to clean out closet not sure what will find.  That led to sidetracked on scary things about spiders and other creatures in [place] .  Working on project since 2017 improving soil pulled shoulder out of socket popped in and crushed cartilage with it. Can't hold things has figured out how to work around.  How are you feeling? Horrible. Really bad last time in session good but it is the physical impact doesn't do pain well. Describes the experience at work standing there trying not to collapse not show pain  and just suffering.  Darlyn thinks goes out of way to look out for her could see his back out and agony and didn't come the next day probably sympathy.  Patient shared 2 incidents of falling on her back when incident nerve crushed against the bone with spinal cord a lot of pain. Doesn't have anything to help with that therapist aware from other people with back issues although cortisone does help.  Makes people laugh and makes her feel good. Some answered a survey said she was personable, helpful, funny person.  Three years there good at it and have respect for the manager. Nothing but good to say about [employer] allow employees to move up give them a chance to advance.     Noted patient was doing well last week but physical condition impacts her mental health.  After 2 days knees gave out and had a call out for work.  Therapist brainstormed with her other physicians she could supplement where she would have to move around so much.  The positive is she really likes working there likes the staff has nothing but good things to say about [employer] and realizes going to a job is good for her as well.  Noted she can get people to laugh that makes her feel good therapist noted laughing is one of the best ways to cope and we laughed about a couple things in session.  Talking about falling for example how humans beings can be even though serious glad when nobody sees as for example.  Talked about old injuries contributing to current physical situation.  About some of her positive experiences that help her feel good such as running into somebody where they had mutual people they know of these are people patient thinks fondly of her teachers when [employer].  In general therapy provides a place to engage for patient processing thoughts and feelings and help with coping.  Suicidal/Homicidal: No  Plan: Return again in 2 weeks.2. Work on current stressors past trauma  Diagnosis: Mood disorder in conditions  classified elsewhere, Generalized  anxiety disorder, severe alcohol use  disorder  Collaboration of Care: Other none needed  Patient/Guardian was advised Release of Information must be obtained prior to any record release in order to collaborate their care with an outside provider. Patient/Guardian was advised if they have not already done so to contact the registration department to sign all necessary forms in order for us  to release information regarding their care.   Consent: Patient/Guardian gives verbal consent for treatment and assignment of benefits for services provided during this visit. Patient/Guardian expressed understanding and agreed to proceed.   Ronal Sink, LCSW 01/31/2024

## 2024-02-01 ENCOUNTER — Ambulatory Visit

## 2024-02-01 ENCOUNTER — Encounter: Payer: Self-pay | Admitting: Urgent Care

## 2024-02-01 ENCOUNTER — Ambulatory Visit (INDEPENDENT_AMBULATORY_CARE_PROVIDER_SITE_OTHER): Admitting: Urgent Care

## 2024-02-01 VITALS — BP 112/78 | HR 85 | Ht 62.0 in | Wt 169.0 lb

## 2024-02-01 DIAGNOSIS — M25552 Pain in left hip: Secondary | ICD-10-CM | POA: Diagnosis not present

## 2024-02-01 DIAGNOSIS — G8929 Other chronic pain: Secondary | ICD-10-CM

## 2024-02-01 DIAGNOSIS — M25562 Pain in left knee: Secondary | ICD-10-CM

## 2024-02-01 DIAGNOSIS — M25561 Pain in right knee: Secondary | ICD-10-CM | POA: Diagnosis not present

## 2024-02-01 DIAGNOSIS — M25462 Effusion, left knee: Secondary | ICD-10-CM | POA: Diagnosis not present

## 2024-02-01 DIAGNOSIS — M170 Bilateral primary osteoarthritis of knee: Secondary | ICD-10-CM | POA: Diagnosis not present

## 2024-02-01 NOTE — Progress Notes (Signed)
 Established Patient Office Visit  Subjective:  Patient ID: Lindsay [name], female    DOB: 02-12-1956  Age: 68 y.o. MRN: [id number]  Chief Complaint  Patient presents with   Joint Swelling    HPI  Discussed the use of AI scribe software for clinical note transcription with the patient, who gave verbal consent to proceed.  History of Present Illness   The patient, with osteoarthritis, presents with knee swelling and pain.  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 The swelling is severe enough to prevent comfortable walking, requiring her to spend four days in bed with her legs elevated. This degree of swelling is atypical and exacerbated by prolonged standing. Her job typically involves standing for long periods, but her manager usually spaces out her shifts to allow for recovery time. Standing for seven hours without breaks is particularly challenging and has led to her missing work due to the inability to stand.  She manages her knee pain with topical treatments such as Voltaren and Salonpas patches, which she finds effective. She does not take oral anti-inflammatories and opts not to due to her GI condition. She has a history of osteoarthritis and joint pain, including in her knees. She has experienced falls that have contributed to her knee and back issues. She has received steroid injections in the past, which provided temporary relief, but she is concerned about the long-term effects of steroids on her joints.  Her current medications include pantoprazole  and sucralfate  for gastrointestinal issues. She has discontinued several medications, including B complex, biotin, bupropion , Flonase , omega-3s, and Tylenol , due to various reasons including side effects and financial constraints. She has a history of depression and bipolar disorder, for which she was previously prescribed Wellbutrin , but discontinued it  due to sleep disturbances and feels well controlled off the medication.     Patient Active Problem List   Diagnosis Date Noted   B12 deficiency 10/24/2023   Seasonal allergies 10/21/2023   Gastroesophageal reflux disease without esophagitis 10/21/2023   Epigastric pain 10/21/2023   Stress 02/02/2023   Insomnia 12/23/2022   Blood clotting disorder (HCC) 10/28/2022   Neck pain 10/27/2022   Acute thoracic back pain 10/27/2022   Lumbar radiculopathy 05/26/2022   Degeneration of lumbar intervertebral disc 05/26/2022   Major depressive disorder    Severe alcohol use disorder    Generalized anxiety disorder    Obstructive sleep apnea    Hepatic steatosis 04/22/2022   Osteoarthritis of carpometacarpal (CMC) joint of thumb 04/20/2022   Acquired trigger finger of right ring finger 04/20/2022   Bilateral wrist pain 04/20/2022   Chalazion of left lower eyelid 04/15/2022   Bipolar 1 disorder (HCC)    Cervical radiculopathy 10/14/2021   Pain in joint of right hip 07/03/2020   Rib pain 07/03/2020   Recurrent syncope    Orthostasis    Lactic acidosis 12/18/2019   Hematemesis 12/18/2019   Pain in right knee 12/14/2018   PAF (paroxysmal atrial fibrillation) 10/09/2018   Dorsalgia 09/22/2018   Dysphagia 09/22/2018   GERD without esophagitis 09/22/2018   Irritable bowel syndrome without diarrhea 09/22/2018   Osteoarthritis of knee 09/22/2018   Polypharmacy 09/22/2018   Sciatica of right side 09/22/2018   Vitamin D  deficiency 09/22/2018   Mixed hyperlipidemia 05/07/2016   Chronic atrial fibrillation (HCC) 09/08/2015   Coronary artery disease involving native coronary artery with angina pectoris 03/24/2015   Essential hypertension 03/03/2015   Chest  pain 02/11/2015   Non-ST elevation MI (NSTEMI) (HCC) 07/26/2008   Past Medical History:  Diagnosis Date   Abnormal mammogram    Thick Tissue   Acquired trigger finger of right ring finger 04/20/2022   Arthritis    Bilateral wrist pain  04/20/2022   Bipolar 1 disorder    Cataract    OU   Cervical radiculopathy 10/14/2021   Chalazion of left lower eyelid 04/15/2022   Chest pain 02/11/2015   Chronic atrial fibrillation 09/08/2015   Colon polyp    Compressed cervical disc    Coronary artery disease involving native coronary artery with angina pectoris 03/24/2015   Dorsalgia 09/22/2018   Dysphagia 09/22/2018   Essential hypertension 03/03/2015   Generalized anxiety disorder    GERD without esophagitis 09/22/2018   Hematemesis 12/18/2019   Hepatic steatosis 04/22/2022   History of appendectomy 1963   Hypertensive retinopathy    OU   Irritable bowel syndrome without diarrhea 09/22/2018   Lactic acidosis 12/18/2019   LLQ pain 04/15/2022   Major depressive disorder    Migraine without status migrainosus, not intractable 09/22/2018   Mixed hyperlipidemia 05/07/2016   Non-ST elevation MI (NSTEMI) 07/26/2008   No cardiac catheterization at the time.   Obstructive sleep apnea    no CPAP use   Orthostasis    Osteoarthritis of carpometacarpal (CMC) joint of thumb 04/20/2022   Osteoarthritis of knee 09/22/2018   PAF (paroxysmal atrial fibrillation) 10/09/2018   Pain in joint of right hip 07/03/2020   Pain in right knee 12/14/2018   Recurrent syncope    Rib pain 07/03/2020   Sciatica of right side 09/22/2018   Severe alcohol use disorder    Vitamin D  deficiency    Past Surgical History:  Procedure Laterality Date   APPENDECTOMY     CARDIAC CATHETERIZATION N/A 03/25/2015   Procedure: Left Heart Cath and Coronary Angiography;  Surgeon: Alm LELON Clay, MD;  Location: [facility] INVASIVE CV LAB;  Service: Cardiovascular;  Laterality: N/A;   CORONARY STENT INTERVENTION N/A 10/09/2018   Procedure: CORONARY STENT INTERVENTION;  Surgeon: Dann Candyce RAMAN, MD;  Location: [facility] INVASIVE CV LAB;  Service: Cardiovascular;  Laterality: N/A;   DILATATION & CURETTAGE/HYSTEROSCOPY WITH MYOSURE N/A 05/19/2016   Procedure: DILATATION &  CURETTAGE/HYSTEROSCOPY WITH MYOSURE;  Surgeon: Hargis Paradise, MD;  Location: [facility] ORS;  Service: Gynecology;  Laterality: N/A;  Possible Myosure for polyps.   EYE SURGERY     LEFT HEART CATH AND CORONARY ANGIOGRAPHY N/A 10/09/2018   Procedure: LEFT HEART CATH AND CORONARY ANGIOGRAPHY;  Surgeon: Dann Candyce RAMAN, MD;  Location: [facility] INVASIVE CV LAB;  Service: Cardiovascular;  Laterality: N/A;   MOUTH SURGERY     URETHRAL DILATION     Social History   Tobacco Use   Smoking status: Never   Smokeless tobacco: Never  Vaping Use   Vaping status: Never Used  Substance Use Topics   Alcohol use: Yes    Comment: hx of severe alcohol abuse; most recent admission 02/2022   Drug use: Never      ROS: as noted in HPI  Objective:     BP 112/78   Pulse 85   Ht 5' 2 (1.575 m)   Wt 169 lb (76.7 kg)   SpO2 96%   BMI 30.91 kg/m  BP Readings from Last 3 Encounters:  02/01/24 112/78  01/08/24 119/82  12/26/23 137/86   Wt Readings from Last 3 Encounters:  02/01/24 169 lb (76.7 kg)  12/26/23 172 lb (78 kg)  10/18/23 175 lb 3.2 oz (79.5 kg)      Physical Exam Vitals and nursing note reviewed.  Constitutional:      General: She is not in acute distress.    Appearance: Normal appearance. She is not ill-appearing, toxic-appearing or diaphoretic.  HENT:     Head: Normocephalic and atraumatic.     Mouth/Throat:     Mouth: Mucous membranes are moist.     Pharynx: Oropharynx is clear. No oropharyngeal exudate or posterior oropharyngeal erythema.  Eyes:     General: No scleral icterus.       Right eye: No discharge.        Left eye: No discharge.     Extraocular Movements: Extraocular movements intact.     Pupils: Pupils are equal, round, and reactive to light.  Neck:     Thyroid : No thyroid  mass, thyromegaly or thyroid  tenderness.  Cardiovascular:     Rate and Rhythm: Normal rate and regular rhythm.     Pulses: Normal pulses.     Heart sounds: No murmur heard. Pulmonary:      Effort: Pulmonary effort is normal. No respiratory distress.     Breath sounds: Normal breath sounds. No stridor. No wheezing or rhonchi.  Musculoskeletal:        General: Tenderness (distal hamstrings on L) present.     Cervical back: Normal range of motion and neck supple. No rigidity or tenderness.     Right lower leg: No edema.     Left lower leg: No edema.     Comments: B knee crepitus, mild effusion to B knees  Lymphadenopathy:     Cervical: No cervical adenopathy.  Skin:    General: Skin is warm and dry.     Findings: No erythema or rash.  Neurological:     General: No focal deficit present.     Mental Status: She is alert and oriented to person, place, and time.     Sensory: No sensory deficit.     Motor: No weakness.  Psychiatric:        Mood and Affect: Mood normal.        Behavior: Behavior normal.      No results found for any visits on 02/01/24.  Last CBC Lab Results  Component Value Date   WBC 6.3 12/26/2023   HGB 14.0 12/26/2023   HCT 39.3 12/26/2023   MCV 90.3 12/26/2023   MCH 32.2 12/26/2023   RDW 13.0 12/26/2023   PLT 190 12/26/2023   Last metabolic panel Lab Results  Component Value Date   GLUCOSE 114 (H) 12/26/2023   NA 139 12/26/2023   K 3.4 (L) 12/26/2023   CL 101 12/26/2023   CO2 23 12/26/2023   BUN 13 12/26/2023   CREATININE 0.67 12/26/2023   GFRNONAA >60 12/26/2023   CALCIUM 9.5 12/26/2023   PROT 7.0 12/26/2023   ALBUMIN 4.4 12/26/2023   LABGLOB 2.3 10/21/2023   AGRATIO 1.9 10/02/2018   BILITOT 0.6 12/26/2023   ALKPHOS 99 12/26/2023   AST 25 12/26/2023   ALT 16 12/26/2023   ANIONGAP 14 12/26/2023   Last lipids Lab Results  Component Value Date   CHOL 189 10/14/2023   HDL 66 10/14/2023   LDLCALC 106 (H) 10/14/2023   TRIG 97 10/14/2023   CHOLHDL 2.9 10/14/2023   Last hemoglobin A1c No results found for: HGBA1C Last thyroid  functions Lab Results  Component Value Date   TSH 1.134 12/18/2019   Last vitamin D  Lab  Results  Component Value Date   VD25OH 32.1 10/21/2023   Last vitamin B12 and Folate Lab Results  Component Value Date   VITAMINB12 >2000 (H) 10/21/2023   FOLATE 9.0 10/21/2023      The ASCVD Risk score (Arnett DK, et al., 2019) failed to calculate for the following reasons:   Risk score cannot be calculated because patient has a medical history suggesting prior/existing ASCVD  Assessment & Plan:  Bilateral chronic knee pain -     Ambulatory referral to Sports Medicine -     DG Knee Complete 4 Views Right; Future -     DG Knee Complete 4 Views Left; Future  Chronic left hip pain  Assessment and Plan    Knee Osteoarthritis Chronic knee osteoarthritis exacerbated by prolonged standing. Current topical treatments mildly effective. Discussed Synvisc injections as a non-steroidal option for joint lubrication and pain relief. She is interested in this option. 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 - Order knee x-rays to assess condition and facilitate injections. - Refer to sports medicine physician for possible injections.  Bipolar Disorder Previously managed with Wellbutrin , discontinued due to sleep disturbances. Under psychiatric care with Ronal Sink.  General Health Maintenance Current medications: pantoprazole  and sucralfate . Discontinued B complex, biotin, bupropion , Flonase , omega-3s, and Tylenol .  Follow-up Plans for knee management and potential injections discussed. - Obtain knee x-rays today - Follow up with sports medicine physician for evaluation for possible injections.         No follow-ups on file.   Benton LITTIE Gave, PA

## 2024-02-01 NOTE — Patient Instructions (Signed)
 Please perform the stretches attached to this paper at home to help with your left hamstring.  Please continue with the topical voltaren and salonpas.  I have written a work accommodations note.  Please go to [address] to get updated knee xrays. Please schedule a follow up with Dr. Curtis to discuss possible synvisc injections.

## 2024-02-02 ENCOUNTER — Ambulatory Visit: Payer: Self-pay | Admitting: Urgent Care

## 2024-02-03 ENCOUNTER — Other Ambulatory Visit: Payer: Self-pay | Admitting: Urgent Care

## 2024-02-03 DIAGNOSIS — I10 Essential (primary) hypertension: Secondary | ICD-10-CM

## 2024-02-03 MED ORDER — LOSARTAN POTASSIUM 50 MG PO TABS: 50.0000 mg | ORAL_TABLET | Freq: Every day | ORAL | 2 refills | Status: DC

## 2024-02-03 NOTE — Progress Notes (Signed)
 Rx request from [facility] received for Losartan  50mg . Called in refill.

## 2024-02-07 ENCOUNTER — Ambulatory Visit (INDEPENDENT_AMBULATORY_CARE_PROVIDER_SITE_OTHER): Admitting: Licensed Clinical Social Worker

## 2024-02-07 DIAGNOSIS — F411 Generalized anxiety disorder: Secondary | ICD-10-CM | POA: Diagnosis not present

## 2024-02-07 DIAGNOSIS — F1020 Alcohol dependence, uncomplicated: Secondary | ICD-10-CM

## 2024-02-07 DIAGNOSIS — F0630 Mood disorder due to known physiological condition, unspecified: Secondary | ICD-10-CM
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
## 2024-02-21 ENCOUNTER — Ambulatory Visit (INDEPENDENT_AMBULATORY_CARE_PROVIDER_SITE_OTHER): Admitting: Licensed Clinical Social Worker

## 2024-02-21 DIAGNOSIS — F0630 Mood disorder due to known physiological condition, unspecified: Secondary | ICD-10-CM

## 2024-02-21 DIAGNOSIS — F1020 Alcohol dependence, uncomplicated: Secondary | ICD-10-CM

## 2024-02-21 DIAGNOSIS — F411 Generalized anxiety disorder: Secondary | ICD-10-CM
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
## 2024-03-04 ENCOUNTER — Telehealth: Payer: Self-pay | Admitting: Student in an Organized Health Care Education/Training Program

## 2024-03-04 NOTE — Telephone Encounter (Signed)
 Attempted to contact patient about chest tightness and unusual feeling in left leg per operator, unable to reach x2, left contact number to return call on VM.

## 2024-03-04 NOTE — Telephone Encounter (Signed)
 L sided CP intermittently for several weeks, increased chronicity recently. Also reporting issues with heating at house and exposure to black mold for months. Also with SOB and nausea. Was having CP and took SLNG 15 minutes ago and now improved from pain to tenderness in chest. Not in severe distress per her report. Painful to palpation. Currently sitting still, not very active currently to tell whether it changes with activity. She has had CP for several weeks, usually at night when going to sleep. Couldn't find SLNG until recently. 2-3 nights L CP has become more severe, gripping pain. Lasts minutes at a time. Also had LLE swelling at the ankle more than the right. Has elevated legs and conservative tx doesn't help. 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   Explained that with her sx I encouraged her to go to the ED. Last cath 10/09/18 with MVD and PCI. She very likely has progressive CAD with her sx. Her sx have seemed progressive over the past month but present since February. She went on a long political rant for several minutes, possibly seemed intoxicated, rambling. Despite multiple attempts to redirect and get her to come to the ED she wanted only OP f/u. Explained that with her sx it will be difficult to adequately address her issues as an OP however she was very adamant that she has been dealing with these for a while and doesn't want to take off work or board her cats to come to the ED. Will send message as she requested to see Dr. Raford.

## 2024-03-05 NOTE — Telephone Encounter (Signed)
 Held a slot 04/02/24 at 8am for Dr. Raford. This is her soonest available. Given the description of her symptoms, would recommend sooner eval with APP at [facility] or [place] office. However, if she declines any other office visit could schedule for 04/02/24 slot.   Zayneb Baucum S Kawthar Ennen, NP

## 2024-03-06 ENCOUNTER — Ambulatory Visit (INDEPENDENT_AMBULATORY_CARE_PROVIDER_SITE_OTHER): Admitting: Licensed Clinical Social Worker

## 2024-03-06 DIAGNOSIS — F1020 Alcohol dependence, uncomplicated: Secondary | ICD-10-CM | POA: Diagnosis not present

## 2024-03-06 DIAGNOSIS — F0630 Mood disorder due to known physiological condition, unspecified: Secondary | ICD-10-CM

## 2024-03-06 DIAGNOSIS — F411 Generalized anxiety disorder: Secondary | ICD-10-CM
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
## 2024-03-07 NOTE — Telephone Encounter (Signed)
 Lvm to see if pt wants to keep held appt slot or pt needs sooner appt.

## 2024-03-15 NOTE — Telephone Encounter (Signed)
 Lvm to pt to call office or send [facility] message for a sooner appt with Dr. Raford that Caitlin Walker,NP has held a slot.

## 2024-03-19 ENCOUNTER — Ambulatory Visit (HOSPITAL_COMMUNITY): Admitting: Licensed Clinical Social Worker

## 2024-03-20 ENCOUNTER — Ambulatory Visit (INDEPENDENT_AMBULATORY_CARE_PROVIDER_SITE_OTHER): Admitting: Licensed Clinical Social Worker

## 2024-03-20 DIAGNOSIS — F1020 Alcohol dependence, uncomplicated: Secondary | ICD-10-CM | POA: Diagnosis not present

## 2024-03-20 DIAGNOSIS — F411 Generalized anxiety disorder: Secondary | ICD-10-CM | POA: Diagnosis not present

## 2024-03-20 DIAGNOSIS — F0630 Mood disorder due to known physiological condition, unspecified: Secondary | ICD-10-CM

## 2024-03-20 NOTE — Progress Notes (Signed)
 THERAPIST PROGRESS NOTE  Session Time: 1:00 PM to 1:50 PM  Participation Level: Active  Behavioral Response: CasualAlertmood more stable  Type of Therapy: Individual Therapy  Treatment Goals addressed:  work on Pharmacologist for anxiety depression to decrease symptoms, work on mood management, mood stabilization, coping in general, patient is collaterally managing substance use issues through AA, coping, address significant stressor of state of her house and home repair is really necessary  ProgressTowards Goals: Progressing-patient shared going through a month of severe depression coming out therapist reinforced perspective she has developed will go through this comes out of it when she does focus on the present her cats working Niels  Interventions: Solution Focused, Strength-based, Supportive, and Other: coping  Summary: Lindsay [name] is a 68 y.o. female who presents with just got out of month depression finally emerged out of it. August not a good month looking back. 30 days struggle against it do what can to mask it has to deal with it restlessness, sleeplessness inability to focus. When she doesn't fulfill plans that is a marker for her. Stays in bed part is pain in knees and swelling. Employer sorted out schedule between shifts can recover two day. Time to elevate legs put ice, salon patch. Can't take NSAIDS because of gastrointestinal issues. The other day woke up and got a lot done. Woke up had energy. Usually sit there and don't do it feels worse. Dealing pain leads to depression can't sleep too weak to do anything for long period. Even washing dishes can be difficulty it is when they pile up.    Niels really Shelvy Niels. Paying for all of this.  Noted doing this for patient is just part of her she does this for everybody. Things she does drive her crazy so minor. She is that way with everyone not the only one. Niels helping her with desperate situation.  Go to work agony put a face on take  half of hydrocodone  and then other half.  Does not abuse therapist gets it in agony prescribed Now that cognizant doing research, sad but not crushed by it not focused on unknown future practice her Zen and letting go. Not control over the future. Do best and work in present. Online with friends [employer] cult members at 60 didn't believe went to meeting led to de-segregating the meetings. Some deeply influenced some grew up in it. Renay Essex shared about him being a good friend therapist assess good to be in touch with him she said it is a good thing. SABRA Curtis of her life struggle, struggled something then great.  Going to camp in [place]  the struggle to get there and then once out of the airport saw a bunch of Blue Angels blue Angels one of things wanted to see in her life. Bearing her soul to this guy stuck around with her he was the one supposed to pick her up when she said had not patient thought was cool that he just stuck around and did not tell her from the camp and just hung out with her. .  Talked about love of her life. Russ.Gmirkin. In the cult how met. He was in college first time talk to boys without father hovering made best friends. Talked about experiences at [facility] patient challenging doctrine of the cult.  State of mind right now caught up on power bill current on water bill house is falling apart, cat ate a blue lizard they are poisonous to cats. The wall  and window is collapsing in bedroom. Looking at who can help ask for quotes what kind of work needs done. Two teeth rotting that or wall. Winter coming.  Thinking of something temporary plywood studs screw to that area keep out insects and rats. Got kitchen together was at point looked at it couldn't do it again all these projects involved with. Met somebody at work a Chartered loss adjuster has a really nice bicycle top of the line hoping she can sell that therapist likes that plan.  Patient says pulled out of agony felt  suicidal remind it gets better recover and gets better happens again gets better has to remind herself let go of regrets of the past concerns about the future focus on the present which include her cats Chloe and Poggi working [employer].    Patient shared coming out of a month-long depression not sure why notices August not a good month.  Feeling suicidal but hurting herself has gotten through this before and gets better will happen again but gets through it so not focusing on the future or the past but focusing on the present which includes her cats Niels and work.  Patient utilized session to talk about different areas of her life meaningful relationships fun experiences both positive interchanges with friends as well as dealing with the loss of who she describes as level of her life.  Patient describes the impact of physical issues pain to her mental health that contributes significantly why loses energy, cannot sleep part of her decline so needs to be considered in the whole picture what plays a part in her mental health.  Talked about how stressors difficulties with house which gets to the point of dire straits and then talks about Niels stepping in to really help her and having such a appreciation for her.  Talked about a current issue and figuring out how to address with limited finances describes wall of bedroom literally is crumbling literally like she is outside.  Therapist noted good she has the energy and for step to get find out what is going on and go from there. Access in general therapy helps patient helps clients process emotions and gain clarity. It's a safe outlet for reflection and emotional release   Suicidal/Homicidal: No  Plan: Return again in 2 weeks.2. 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 Patient/Guardian was advised if they have not already done so to contact the registration department to sign all necessary forms in order for us  to release information regarding their care.   Consent: Patient/Guardian gives verbal consent for treatment and assignment of benefits for services provided during this visit. Patient/Guardian expressed understanding and agreed to proceed.   Ronal Sink, [place] 03/20/2024

## 2024-04-03 ENCOUNTER — Ambulatory Visit (HOSPITAL_COMMUNITY): Admitting: Licensed Clinical Social Worker

## 2024-04-03 ENCOUNTER — Encounter (HOSPITAL_COMMUNITY): Payer: Self-pay | Admitting: Psychiatry

## 2024-04-03 ENCOUNTER — Ambulatory Visit (INDEPENDENT_AMBULATORY_CARE_PROVIDER_SITE_OTHER): Admitting: Psychiatry

## 2024-04-03 VITALS — BP 108/72 | HR 75 | Ht 62.0 in | Wt 169.8 lb

## 2024-04-03 DIAGNOSIS — F411 Generalized anxiety disorder: Secondary | ICD-10-CM

## 2024-04-03 DIAGNOSIS — F1020 Alcohol dependence, uncomplicated: Secondary | ICD-10-CM

## 2024-04-03 DIAGNOSIS — F319 Bipolar disorder, unspecified: Secondary | ICD-10-CM | POA: Diagnosis not present

## 2024-04-03 DIAGNOSIS — F0630 Mood disorder due to known physiological condition, unspecified: Secondary | ICD-10-CM

## 2024-04-03 NOTE — Progress Notes (Signed)
 [facility] Follow up visit  Patient Identification: Lindsay [name] MRN:  [id number] Date of Evaluation:  04/03/2024 Referral Source: primary care Chief Complaint:   No chief complaint on file. Depression, anxiety , alcohol use Visit Diagnosis:    ICD-10-CM   1. Mood disorder in conditions classified elsewhere  F06.30     2. Generalized anxiety disorder  F41.1     3. Alcohol use disorder, moderate, dependence (HCC)  F10.20     4. Severe alcohol use disorder  F10.20       History of Present Illness: Patient is a 68 years old currently single Caucasian female initailly  referred by primary care physician to establish care for history of bipolar, depression and anxiety.  Patient has been in treatment many years ago but has not been taking  medication for more than 10 years and regarding to her bipolar or depression  On social security and working part time at [employer]  Not on wellbutrin  or anti depressants. She wants to be off meds till she figures out her GI bleed and knee pain treatment  Work helps but she works only 1 -2 days   Aggravating factor: difficult childhood and conflicts with  dad, finances,  alcohol use history  Modifying factor: friend, cats  Duration adult life  Severity:manageable   Hospital admission multiple more so related with alcohol use ; detox and depression    Past Psychiatric History: depression, bipolar   Previous Psychotropic Medications: Yes  Multiple, including risperdal, lamictal  Substance Abuse History in the last 12 months:  Yes.    Consequences of Substance Abuse: Medical Consequences:  discussed effect of alcohol to depression, judjement  Past Medical History:  Past Medical History:  Diagnosis Date   Abnormal mammogram    Thick Tissue   Acquired trigger finger of right ring finger 04/20/2022   Arthritis    Bilateral wrist pain 04/20/2022   Bipolar 1 disorder    Cataract    OU   Cervical radiculopathy 10/14/2021   Chalazion of left  lower eyelid 04/15/2022   Chest pain 02/11/2015   Chronic atrial fibrillation 09/08/2015   Colon polyp    Compressed cervical disc    Coronary artery disease involving native coronary artery with angina pectoris 03/24/2015   Dorsalgia 09/22/2018   Dysphagia 09/22/2018   Essential hypertension 03/03/2015   Generalized anxiety disorder    GERD without esophagitis 09/22/2018   Hematemesis 12/18/2019   Hepatic steatosis 04/22/2022   History of appendectomy 1963   Hypertensive retinopathy    OU   Irritable bowel syndrome without diarrhea 09/22/2018   Lactic acidosis 12/18/2019   LLQ pain 04/15/2022   Major depressive disorder    Migraine without status migrainosus, not intractable 09/22/2018   Mixed hyperlipidemia 05/07/2016   Non-ST elevation MI (NSTEMI) 07/26/2008   No cardiac catheterization at the time.   Obstructive sleep apnea    no CPAP use   Orthostasis    Osteoarthritis of carpometacarpal (CMC) joint of thumb 04/20/2022   Osteoarthritis of knee 09/22/2018   PAF (paroxysmal atrial fibrillation) 10/09/2018   Pain in joint of right hip 07/03/2020   Pain in right knee 12/14/2018   Recurrent syncope    Rib pain 07/03/2020   Sciatica of right side 09/22/2018   Severe alcohol use disorder    Vitamin D  deficiency     Past Surgical History:  Procedure Laterality Date   APPENDECTOMY     CARDIAC CATHETERIZATION N/A 03/25/2015   Procedure: Left Heart Cath and  Coronary Angiography;  Surgeon: Alm LELON Clay, MD;  Location: [facility] INVASIVE CV LAB;  Service: Cardiovascular;  Laterality: N/A;   CORONARY STENT INTERVENTION N/A 10/09/2018   Procedure: CORONARY STENT INTERVENTION;  Surgeon: Dann Candyce RAMAN, MD;  Location: [facility] INVASIVE CV LAB;  Service: Cardiovascular;  Laterality: N/A;   DILATATION & CURETTAGE/HYSTEROSCOPY WITH MYOSURE N/A 05/19/2016   Procedure: DILATATION & CURETTAGE/HYSTEROSCOPY WITH MYOSURE;  Surgeon: Hargis Paradise, MD;  Location: [facility];  Service: Gynecology;   Laterality: N/A;  Possible Myosure for polyps.   EYE SURGERY     LEFT HEART CATH AND CORONARY ANGIOGRAPHY N/A 10/09/2018   Procedure: LEFT HEART CATH AND CORONARY ANGIOGRAPHY;  Surgeon: Dann Candyce RAMAN, MD;  Location: [facility] INVASIVE CV LAB;  Service: Cardiovascular;  Laterality: N/A;   MOUTH SURGERY     URETHRAL DILATION      Family Psychiatric History: cousin some mental illness, father possible depression   Family History:  Family History  Problem Relation Age of Onset   Vascular Disease Mother    Dementia Mother    Hypertension Mother    Stroke Mother    Depression Mother    Dementia Father    Heart attack Father    Stroke Father    Heart attack Maternal Grandmother    Heart attack Maternal Grandfather    Kidney failure Paternal Grandmother    Heart attack Paternal Grandfather     Social History:   Social History   Socioeconomic History   Marital status: Single    Spouse name: Not on file   Number of children: 0   Years of education: 16   Highest education level: Bachelor's degree (e.g., BA, AB, BS)  Occupational History   Not on file  Tobacco Use   Smoking status: Never   Smokeless tobacco: Never  Vaping Use   Vaping status: Never Used  Substance and Sexual Activity   Alcohol use: Yes    Comment: hx of severe alcohol abuse; most recent admission 02/2022   Drug use: Never   Sexual activity: Not Currently  Other Topics Concern   Not on file  Social History Narrative   Right handed   Drinks caffeine   Split level   Social Drivers of Health   Financial Resource Strain: High Risk (10/26/2022)   Overall Financial Resource Strain (CARDIA)    Difficulty of Paying Living Expenses: Hard  Food Insecurity: Food Insecurity Present (10/26/2022)   Hunger Vital Sign    Worried About Running Out of Food in the Last Year: Often true    Ran Out of Food in the Last Year: Sometimes true  Transportation Needs: Unmet Transportation Needs (10/26/2022)   PRAPARE - Transportation     Lack of Transportation (Medical): Yes    Lack of Transportation (Non-Medical): Yes  Physical Activity: Unknown (10/26/2022)   Exercise Vital Sign    Days of Exercise per Week: 0 days    Minutes of Exercise per Session: Not on file  Stress: Stress Concern Present (10/26/2022)   [employer] of Occupational Health - Occupational Stress Questionnaire    Feeling of Stress : Very much  Social Connections: Socially Isolated (10/26/2022)   Social Connection and Isolation Panel    Frequency of Communication with Friends and Family: More than three times a week    Frequency of Social Gatherings with Friends and Family: Once a week    Attends Religious Services: Never    Database administrator or Organizations: No    Attends Club or  Organization Meetings: Not on file    Marital Status: Never married    Additional Social History: grew up with parents, rough growing up or challenging with some conflicts with parents  Allergies:   Allergies  Allergen Reactions   Sulfa Antibiotics Swelling and Rash   Ciprofloxacin  Nausea And Vomiting   Depakote [Divalproex Sodium] Other (See Comments)    Reaction not recalled- it was 20 years ago   Pine Itching   Risperidone Other (See Comments)    Unknown reaction   Seroquel [Quetiapine Fumarate] Other (See Comments)    VIVID nightmares   Statins Other (See Comments)    joint pain, weakness   Tramadol Other (See Comments)    Serotonin syndrome   Citalopram Rash and Other (See Comments)    Insomnia and agitation also   Lamotrigine Itching, Rash and Other (See Comments)    Stevens-Johnson syndrome and Brown urine, also   Other Other (See Comments) and Cough    Cat dander   Topiramate Rash and Other (See Comments)    Nightmares, brown urine also   Wasp Venom Dermatitis    Edema and redness in area of wasp sting   Zetia  [Ezetimibe ]     Joint pain, weakness     Metabolic Disorder Labs: No results found for: HGBA1C, MPG No results found  for: PROLACTIN Lab Results  Component Value Date   CHOL 189 10/14/2023   TRIG 97 10/14/2023   HDL 66 10/14/2023   CHOLHDL 2.9 10/14/2023   VLDL 44 (H) 04/02/2016   LDLCALC 106 (H) 10/14/2023   LDLCALC 129 (H) 04/07/2023   Lab Results  Component Value Date   TSH 1.134 12/18/2019    Therapeutic Level Labs: No results found for: LITHIUM No results found for: CBMZ No results found for: VALPROATE  Current Medications: Current Outpatient Medications  Medication Sig Dispense Refill   amLODipine  (NORVASC ) 2.5 MG tablet Take 1 tablet (2.5 mg total) by mouth daily. 90 tablet 3   azelastine  (OPTIVAR ) 0.05 % ophthalmic solution INSTILL TWO DROPS IN EACH EYE TWICE DAILY 6 mL 5   Cholecalciferol  20 MCG (800 UNIT) TABS Take 1 tablet by mouth daily. 75 tablet 0   clopidogrel  (PLAVIX ) 75 MG tablet Take 1 tablet (75 mg total) by mouth daily. 90 tablet 3   Cyanocobalamin  (B-12 PO) Take 1 tablet by mouth daily.     cyclobenzaprine  (FLEXERIL ) 5 MG tablet TAKE ONE TABLET BY MOUTH AT BEDTIME 30 tablet 0   diclofenac Sodium (VOLTAREN) 1 % GEL Apply topically as needed.     Evolocumab  (REPATHA  SURECLICK) 140 MG/ML SOAJ INJECT ONE PEN INTO THE SKIN EVERY 14 DAYS 2 mL 5   HYDROcodone -acetaminophen  (NORCO/VICODIN) 5-325 MG tablet Take 2 tablets by mouth as needed for moderate pain.     losartan  (COZAAR ) 50 MG tablet Take 1 tablet (50 mg total) by mouth daily. 90 tablet 2   metoprolol  tartrate (LOPRESSOR ) 25 MG tablet TAKE ONE-HALF TABLET BY MOUTH TWICE A DAY 90 tablet 3   nitroGLYCERIN  (NITROSTAT ) 0.4 MG SL tablet Place 1 tablet (0.4 mg total) under the tongue as needed for chest pain. 25 tablet 5   pantoprazole  (PROTONIX ) 20 MG tablet Take 1 tablet (20 mg total) by mouth 2 (two) times daily. 60 tablet 0   Plant Sterols and Stanols (CHOLESTOFF PLUS PO) Take by mouth daily.     Probiotic Product (PROBIOTIC ADVANCED PO) Take by mouth daily.     sucralfate  (CARAFATE ) 1 g tablet Take 1 tablet (1  g  total) by mouth 2 (two) times daily. 60 tablet 0   No current facility-administered medications for this visit.      Psychiatric Specialty Exam: Review of Systems  Neurological:  Negative for tremors.  Psychiatric/Behavioral:  Negative for agitation and self-injury.     Blood pressure 108/72, pulse 75, height 5' 2 (1.575 m), weight 169 lb 12.8 oz (77 kg), SpO2 98%.Body mass index is 31.06 kg/m.  General Appearance: Casual  Eye Contact:  Fair  Speech:  Clear and Coherent  Volume:  Normal  Mood: fair  Affect:  Constricted  Thought Process:  Goal Directed  Orientation:  oriented fully  Thought Content:  Rumination  Suicidal Thoughts:  No  Homicidal Thoughts:  No  Memory:  Immediate;   Fair  Judgement:  Fair  Insight:  Shallow  Psychomotor Activity:  Normal  Concentration:  Concentration: Fair  Recall:  Fair  Fund of Knowledge:Fair  Language: Fair  Akathisia:  No  Handed:    AIMS (if indicated):  no involuntary movements  Assets:  Desire for Improvement Social Support  ADL's:  Intact  Cognition: WNL  Sleep:  Fair   Screenings: GAD-7    Flowsheet Row Office Visit from 04/03/2024 in [facility] at [facility] Office Visit from 02/01/2024 in [facility] at [facility] Office Visit from 10/27/2022 in [facility] Care & [facility] Medicine at [facility] Office Visit from 06/29/2022 in [facility] Primary Care & Sports Medicine at [facility]  Total GAD-7 Score 12 0 5 19   PHQ2-9    Flowsheet Row Office Visit from 04/03/2024 in [facility] at [facility] Office Visit from 02/01/2024 in [facility] Primary Care & Sports Medicine at [facility] Counselor from 05/10/2023 in [facility] at [facility] Office Visit from 12/30/2022 in [facility] at [facility] Office Visit from 11/04/2022 in [facility] Primary Care & Sports Medicine at [facility]  PHQ-2 Total Score 4 0 2 2 5   PHQ-9 Total Score 16 0 18 9 --   Flowsheet Row Office Visit from 01/10/2024 in [facility] at [facility] from 01/08/2024 in [facility] ED from 12/26/2023 in [facility] at [facility]  C-SSRS RISK CATEGORY No Risk No Risk No Risk    Assessment and Plan: as follows  Prior documentation reviewed   Bipolar disorder ; current episode depression:  remains off meds, not on wellbutrin  and working on coping skills and therapy Not want to be on meds for now Risk discussed. Job and therapy Is helping [employer] has helped keep her mood more balanced, still worries of international and global happenings.   GAD; fluctuates, continue therapy and following with providers for medical co morbidities contributing stress  Alcohol use disorder; remains sober, continue relapse prevention. Has used few times but understands the risk k  Sleep : irregular, continue sleep hygiene    FU 6 m or  earlier if needed, recommend continue therapy. Risk discussed of not being on medications for depression  Direct care time  20   minutes including chart review, face to face, documentation   Collaboration of Care: Primary Care Provider AEB reviewed notes and referral  Patient/Guardian was advised Release of Information must be obtained prior to any record release in order to collaborate their care with an outside provider. Patient/Guardian was  advised if they have not already done so to contact the registration department to sign all necessary forms in order for us  to release information regarding their care.   Consent: Patient/Guardian gives verbal consent for treatment and assignment of benefits for services provided during this visit. Patient/Guardian expressed understanding and  agreed to proceed.   Jackey Flight, MD 9/9/202510:51 AM

## 2024-04-10 ENCOUNTER — Ambulatory Visit (HOSPITAL_COMMUNITY): Admitting: Licensed Clinical Social Worker

## 2024-04-10 DIAGNOSIS — F1020 Alcohol dependence, uncomplicated: Secondary | ICD-10-CM

## 2024-04-10 DIAGNOSIS — F0630 Mood disorder due to known physiological condition, unspecified: Secondary | ICD-10-CM | POA: Diagnosis not present

## 2024-04-10 DIAGNOSIS — F411 Generalized anxiety disorder: Secondary | ICD-10-CM | POA: Diagnosis not present

## 2024-04-10 NOTE — Progress Notes (Signed)
 THERAPIST PROGRESS NOTE  Session Time: 1:00 PM to 1:48 PM  Participation Level: Active  Behavioral Response: CasualAlertappropriate  Type of Therapy: Individual Therapy  Treatment Goals addressed:  work on Pharmacologist for anxiety depression to decrease symptoms, work on mood management, mood stabilization, coping in general, patient is collaterally managing substance use issues through AA, coping, address significant stressor of state of her house and home repair is really necessary  ProgressTowards Goals: Progressing-talked about stressors assesses positive patient taking steps to cope although can be challenging processing thoughts and feelings in session therapist assess helps with this coping  Interventions: Solution Focused, Strength-based, Supportive, and Other: Coping  Summary: Lindsay [name] is a 68 y.o. female who presents with talked things she has been dealing with lately and a lot of it relates to chores needs done and big chores at that.  Updated therapist about bankruptcy again Niels helping her she has the equipment to communicate with these different contacts.  Found out she was paying a higher amount and needed to be so plan now is going to pay the official amount extend the bankruptcy couple more months to about 8 9 months therapist noted that was very positive she is seeing the end of it.  This way she can save money which is crucial for patient as limited finances also was given $1000 check which really came in handy.  Also been a started saving his money market.  Talked about issues with the air conditioners need to hold amount not working there is 1 sending in for money back because it was broken during the repairs Niels helping her with that.  Once the air conditioner out of access to things such as the wall crumbling in her bedroom.  Will go to [employer] look at how to repair the window window frame she will have to remove black mold may have would cut prefab has to have it  done before winter. Checked in with mood noted she had done better and then went down again today talked about experience what she was facing wake up with depression not be able to sleep worry about bankruptcy wake up and tear problems with stomach inability to sleep.  Noted legs hurting when sleeping had to take 2 meds to help calm it down close to tachycardia reading on sleep monitor not good with 2 stents.  Therapist noted positive then that there is positive news in terms of bankruptcy and lowering the mountains seeing a finality at sooner rather than later point.  Talked about her cat Chloe terms of keeping her warm no to the house the walls and house needs we wired all these things can be done then would like the house a lot better. Talked about patient has an intellectual reads the [employer] and saves things this is a strength.  This led to patient talked about her relationship with Ronal Roys the artist well-known taking over illustrations for [employer] will show therapist some things. Patient shared friend Renay Essex is a decent human being forced stage cancer talked about was not all bad going to [employer] a cold school met some decent people as well as some schools. Therapist assesses patient's mood improves to therapeutic relationship to seeing some progress through processing thoughts and feelings in session.  Therapist serving as an emotional release supportive and strength-based interventions also reflection and insight to helpful coping.  Suicidal/Homicidal: No  Plan: Return again in 2 weeks.2.ontinue to work past issues right now letting  go of anger helping her work on mental health as well as coping with stressors therapist encouraged patient with positive coping such as getting into her drawing 3.  Explored patient is in early remission Diagnosis: Mood disorder in conditions classified elsewhere, Generalized  anxiety disorder, alcohol use disorder moderate dependence need to  explore if early remission   Collaboration of Care: Medication Management AEB review of Dr. Geralene note  Patient/Guardian was advised Release of Information must be obtained prior to any record release in order to collaborate their care with an outside provider. Patient/Guardian was advised if they have not already done so to contact the registration department to sign all necessary forms in order for us  to release information regarding their care.   Consent: Patient/Guardian gives verbal consent for treatment and assignment of benefits for services provided during this visit. Patient/Guardian expressed understanding and agreed to proceed.   Ronal Sink, LCSW 04/10/2024

## 2024-04-16 ENCOUNTER — Ambulatory Visit
Admission: RE | Admit: 2024-04-16 | Discharge: 2024-04-16 | Disposition: A | Discharge status: Home / Self Care | Source: Ambulatory Visit | Attending: Family Medicine | Admitting: Family Medicine

## 2024-04-16 VITALS — BP 120/79 | HR 77 | Temp 97.9°F | Resp 17

## 2024-04-16 DIAGNOSIS — L03116 Cellulitis of left lower limb: Secondary | ICD-10-CM | POA: Diagnosis not present

## 2024-04-16 DIAGNOSIS — W5501XA Bitten by cat, initial encounter: Secondary | ICD-10-CM

## 2024-04-16 DIAGNOSIS — S91052A Open bite, left ankle, initial encounter: Secondary | ICD-10-CM

## 2024-04-16 MED ORDER — AMOXICILLIN-POT CLAVULANATE 875-125 MG PO TABS: 1.0000 | ORAL_TABLET | Freq: Two times a day (BID) | ORAL | 0 refills | Status: DC

## 2024-04-16 NOTE — ED Triage Notes (Signed)
 Pt c/o cat bite/scratch to LT lower leg since yesterday. Is currently fostering 68 y/o old female cat, utd on vaccines. Clean with soap and water.

## 2024-04-16 NOTE — ED Provider Notes (Signed)
 [facility]    CSN: [id number] Arrival date & time: 04/16/24  1457      History   Chief Complaint Chief Complaint  Patient presents with   Puncture Wound    Cat bite    HPI Lindsay [name] is a 68 y.o. female.   Patient has a cat bite left ankle Is her personal pet and us  UTD on rabies It is getting rapidly red and swollen    Past Medical History:  Diagnosis Date   Abnormal mammogram    Thick Tissue   Acquired trigger finger of right ring finger 04/20/2022   Arthritis    Bilateral wrist pain 04/20/2022   Bipolar 1 disorder    Cataract    OU   Cervical radiculopathy 10/14/2021   Chalazion of left lower eyelid 04/15/2022   Chest pain 02/11/2015   Chronic atrial fibrillation 09/08/2015   Colon polyp    Compressed cervical disc    Coronary artery disease involving native coronary artery with angina pectoris 03/24/2015   Dorsalgia 09/22/2018   Dysphagia 09/22/2018   Essential hypertension 03/03/2015   Generalized anxiety disorder    GERD without esophagitis 09/22/2018   Hematemesis 12/18/2019   Hepatic steatosis 04/22/2022   History of appendectomy 1963   Hypertensive retinopathy    OU   Irritable bowel syndrome without diarrhea 09/22/2018   Lactic acidosis 12/18/2019   LLQ pain 04/15/2022   Major depressive disorder    Migraine without status migrainosus, not intractable 09/22/2018   Mixed hyperlipidemia 05/07/2016   Non-ST elevation MI (NSTEMI) 07/26/2008   No cardiac catheterization at the time.   Obstructive sleep apnea    no CPAP use   Orthostasis    Osteoarthritis of carpometacarpal (CMC) joint of thumb 04/20/2022   Osteoarthritis of knee 09/22/2018   PAF (paroxysmal atrial fibrillation) 10/09/2018   Pain in joint of right hip 07/03/2020   Pain in right knee 12/14/2018   Recurrent syncope    Rib pain 07/03/2020   Sciatica of right side 09/22/2018   Severe alcohol use disorder    Vitamin D  deficiency     Patient Active Problem List    Diagnosis Date Noted   B12 deficiency 10/24/2023   Seasonal allergies 10/21/2023   Gastroesophageal reflux disease without esophagitis 10/21/2023   Epigastric pain 10/21/2023   Stress 02/02/2023   Insomnia 12/23/2022   Blood clotting disorder 10/28/2022   Neck pain 10/27/2022   Acute thoracic back pain 10/27/2022   Lumbar radiculopathy 05/26/2022   Degeneration of lumbar intervertebral disc 05/26/2022   Major depressive disorder    Severe alcohol use disorder    Generalized anxiety disorder    Obstructive sleep apnea    Hepatic steatosis 04/22/2022   Osteoarthritis of carpometacarpal (CMC) joint of thumb 04/20/2022   Acquired trigger finger of right ring finger 04/20/2022   Bilateral wrist pain 04/20/2022   Chalazion of left lower eyelid 04/15/2022   Bipolar 1 disorder (HCC)    Cervical radiculopathy 10/14/2021   Pain in joint of right hip 07/03/2020   Rib pain 07/03/2020   Recurrent syncope    Orthostasis    Lactic acidosis 12/18/2019   Hematemesis 12/18/2019   Pain in right knee 12/14/2018   PAF (paroxysmal atrial fibrillation) 10/09/2018   Dorsalgia 09/22/2018   Dysphagia 09/22/2018   GERD without esophagitis 09/22/2018   Irritable bowel syndrome without diarrhea 09/22/2018   Osteoarthritis of knee 09/22/2018   Polypharmacy 09/22/2018   Sciatica of right side 09/22/2018   Vitamin D   deficiency 09/22/2018   Mixed hyperlipidemia 05/07/2016   Chronic atrial fibrillation (HCC) 09/08/2015   Coronary artery disease involving native coronary artery with angina pectoris 03/24/2015   Essential hypertension 03/03/2015   Chest pain 02/11/2015   Non-ST elevation MI (NSTEMI) (HCC) 07/26/2008    Past Surgical History:  Procedure Laterality Date   APPENDECTOMY     CARDIAC CATHETERIZATION N/A 03/25/2015   Procedure: Left Heart Cath and Coronary Angiography;  Surgeon: Alm LELON Clay, MD;  Location: [facility] INVASIVE CV LAB;  Service: Cardiovascular;  Laterality: N/A;   CORONARY STENT  INTERVENTION N/A 10/09/2018   Procedure: CORONARY STENT INTERVENTION;  Surgeon: Dann Candyce RAMAN, MD;  Location: [facility] INVASIVE CV LAB;  Service: Cardiovascular;  Laterality: N/A;   DILATATION & CURETTAGE/HYSTEROSCOPY WITH MYOSURE N/A 05/19/2016   Procedure: DILATATION & CURETTAGE/HYSTEROSCOPY WITH MYOSURE;  Surgeon: Hargis Paradise, MD;  Location: [facility];  Service: Gynecology;  Laterality: N/A;  Possible Myosure for polyps.   EYE SURGERY     LEFT HEART CATH AND CORONARY ANGIOGRAPHY N/A 10/09/2018   Procedure: LEFT HEART CATH AND CORONARY ANGIOGRAPHY;  Surgeon: Dann Candyce RAMAN, MD;  Location: [facility] INVASIVE CV LAB;  Service: Cardiovascular;  Laterality: N/A;   MOUTH SURGERY     URETHRAL DILATION      OB History   No obstetric history on file.      Home Medications    Prior to Admission medications   Medication Sig Start Date End Date Taking? Authorizing Provider  amoxicillin -clavulanate (AUGMENTIN ) 875-125 MG tablet Take 1 tablet by mouth every 12 (twelve) hours. 04/16/24  Yes Maranda Jamee Jacob, MD  amLODipine  (NORVASC ) 2.5 MG tablet Take 1 tablet (2.5 mg total) by mouth daily. 11/07/23   Raford Riggs, MD  azelastine  (OPTIVAR ) 0.05 % ophthalmic solution INSTILL TWO DROPS IN EACH EYE TWICE DAILY 06/07/23   Breeback, Jade L, PA-C  Cholecalciferol  20 MCG (800 UNIT) TABS Take 1 tablet by mouth daily. 10/28/22   Bevin Bernice RAMAN, DO  clopidogrel  (PLAVIX ) 75 MG tablet Take 1 tablet (75 mg total) by mouth daily. 02/15/23   Raford Riggs, MD  Cyanocobalamin  (B-12 PO) Take 1 tablet by mouth daily.    [provider]  cyclobenzaprine  (FLEXERIL ) 5 MG tablet TAKE ONE TABLET BY MOUTH AT BEDTIME 04/19/23   Bevin Bernice S, DO  diclofenac Sodium (VOLTAREN) 1 % GEL Apply topically as needed.    [provider]  Evolocumab  (REPATHA  SURECLICK) 140 MG/ML SOAJ INJECT ONE PEN INTO THE SKIN EVERY 14 DAYS 11/07/23   Hilty, Vinie BROCKS, MD  HYDROcodone -acetaminophen  (NORCO/VICODIN) 5-325 MG  tablet Take 2 tablets by mouth as needed for moderate pain.    [provider]  losartan  (COZAAR ) 50 MG tablet Take 1 tablet (50 mg total) by mouth daily. 02/03/24   Crain, Benton L, PA  metoprolol  tartrate (LOPRESSOR ) 25 MG tablet TAKE ONE-HALF TABLET BY MOUTH TWICE A DAY 02/07/23   Hilty, Vinie BROCKS, MD  nitroGLYCERIN  (NITROSTAT ) 0.4 MG SL tablet Place 1 tablet (0.4 mg total) under the tongue as needed for chest pain. 11/17/21   Walker, Caitlin S, NP  pantoprazole  (PROTONIX ) 20 MG tablet Take 1 tablet (20 mg total) by mouth 2 (two) times daily. 12/26/23 04/03/24  Veta Palma, PA-C  Plant Sterols and Stanols (CHOLESTOFF PLUS PO) Take by mouth daily.    [provider]  Probiotic Product (PROBIOTIC ADVANCED PO) Take by mouth daily.    [provider]  sucralfate  (CARAFATE ) 1 g tablet Take 1 tablet (1 g  total) by mouth 2 (two) times daily. 12/26/23 04/03/24  Veta Palma, PA-C    Family History Family History  Problem Relation Age of Onset   Vascular Disease Mother    Dementia Mother    Hypertension Mother    Stroke Mother    Depression Mother    Dementia Father    Heart attack Father    Stroke Father    Heart attack Maternal Grandmother    Heart attack Maternal Grandfather    Kidney failure Paternal Grandmother    Heart attack Paternal Grandfather     Social History Social History   Tobacco Use   Smoking status: Never   Smokeless tobacco: Never  Vaping Use   Vaping status: Never Used  Substance Use Topics   Alcohol use: Yes    Comment: hx of severe alcohol abuse; most recent admission 02/2022   Drug use: Never     Allergies   Sulfa antibiotics, Ciprofloxacin , Depakote [divalproex sodium], Pine, Risperidone, Seroquel [quetiapine fumarate], Statins, Tramadol, Citalopram, Lamotrigine, Other, Topiramate, Wasp venom, and Zetia  [ezetimibe ]   Review of Systems Review of Systems  See HPI Physical Exam Triage Vital Signs ED Triage Vitals   Encounter Vitals Group     BP 04/16/24 1506 120/79     Girls Systolic BP Percentile --      Girls Diastolic BP Percentile --      Boys Systolic BP Percentile --      Boys Diastolic BP Percentile --      Pulse Rate 04/16/24 1506 77     Resp 04/16/24 1506 17     Temp 04/16/24 1506 97.9 F (36.6 C)     Temp Source 04/16/24 1506 Oral     SpO2 04/16/24 1506 96 %     Weight --      Height --      Head Circumference --      Peak Flow --      Pain Score 04/16/24 1507 1     Pain Loc --      Pain Education --      Exclude from Growth Chart --    No data found.  Updated Vital Signs BP 120/79 (BP Location: Right Arm)   Pulse 77   Temp 97.9 F (36.6 C) (Oral)   Resp 17   SpO2 96%       Physical Exam Constitutional:      General: She is not in acute distress.    Appearance: She is well-developed.  HENT:     Head: Normocephalic and atraumatic.  Eyes:     Conjunctiva/sclera: Conjunctivae normal.     Pupils: Pupils are equal, round, and reactive to light.  Cardiovascular:     Rate and Rhythm: Normal rate.  Pulmonary:     Effort: Pulmonary effort is normal. No respiratory distress.  Abdominal:     General: There is no distension.     Palpations: Abdomen is soft.  Musculoskeletal:        General: Normal range of motion.     Cervical back: Normal range of motion.  Skin:    General: Skin is warm and dry.     Findings: Rash present.     Comments: Posterior left ankle with 2 distinct punctures with surrounding 10 cm of erythema.  Tender and warm  Neurological:     Mental Status: She is alert.      UC Treatments / Results  Labs (all labs ordered are listed, but only abnormal results are  displayed) Labs Reviewed - No data to display  EKG   Radiology No results found.  Procedures Procedures (including critical care time)  Medications Ordered in UC Medications - No data to display  Initial Impression / Assessment and Plan / UC Course  I have reviewed the triage  vital signs and the nursing notes.  Pertinent labs & imaging results that were available during my care of the patient were reviewed by me and considered in my medical decision making (see chart for details).     Final Clinical Impressions(s) / UC Diagnoses   Final diagnoses:  Cat bite of left ankle, initial encounter  Cellulitis of left ankle     Discharge Instructions      Your last tetanus was 2021  Take the antibiotic 2 x a day  Return if needed   ED Prescriptions     Medication Sig Dispense Auth. Provider   amoxicillin -clavulanate (AUGMENTIN ) 875-125 MG tablet Take 1 tablet by mouth every 12 (twelve) hours. 14 tablet Maranda Jamee Jacob, MD      PDMP not reviewed this encounter.   Maranda Jamee Jacob, MD 04/16/24 [phone]

## 2024-04-16 NOTE — Discharge Instructions (Signed)
 Your last tetanus was 2021  Take the antibiotic 2 x a day  Return if needed

## 2024-04-17 ENCOUNTER — Other Ambulatory Visit: Payer: Self-pay | Admitting: Physician Assistant

## 2024-04-17 ENCOUNTER — Telehealth: Payer: Self-pay | Admitting: Emergency Medicine

## 2024-04-17 DIAGNOSIS — R1013 Epigastric pain: Secondary | ICD-10-CM

## 2024-04-17 DIAGNOSIS — K219 Gastro-esophageal reflux disease without esophagitis: Secondary | ICD-10-CM

## 2024-04-17 NOTE — Telephone Encounter (Signed)
 Spoke with patient states that she's doing well, will take her 2nd dose of antibiotics this morning.  Will follow up as needed.

## 2024-04-24 ENCOUNTER — Ambulatory Visit (INDEPENDENT_AMBULATORY_CARE_PROVIDER_SITE_OTHER): Admitting: Licensed Clinical Social Worker

## 2024-04-24 DIAGNOSIS — F0630 Mood disorder due to known physiological condition, unspecified: Secondary | ICD-10-CM | POA: Diagnosis not present

## 2024-04-24 DIAGNOSIS — F411 Generalized anxiety disorder: Secondary | ICD-10-CM

## 2024-04-24 DIAGNOSIS — F1090 Alcohol use, unspecified, uncomplicated: Secondary | ICD-10-CM | POA: Diagnosis not present

## 2024-04-24 DIAGNOSIS — F1020 Alcohol dependence, uncomplicated: Secondary | ICD-10-CM

## 2024-04-24 NOTE — Progress Notes (Signed)
 THERAPIST PROGRESS NOTE  Session Time: 1:00 PM to 1:45 PM  Participation Level: Active  Behavioral Response: CasualAlertAnxious, Dysphoric, and patient has stressors at the same time able to be positive in session about some things  Type of Therapy: Individual Therapy  Treatment Goals addressed: work on coping skills for anxiety depression to decrease symptoms, work on mood management, mood stabilization, coping in general, patient is collaterally managing substance use issues through AA, coping, address significant stressor of state of her house and home repair is really necessary  ProgressTowards Goals: Progressing-patient going through significant stressors think it helps to have the support of therapy helps her to process thoughts and feelings in session also to reinforce patient focusing on positive  Interventions: Solution Focused, Strength-based, Supportive, and Other: Coping  Summary: Lindsay [name] is a 68 y.o. female who presents with decided to rescue one of the cats. Three weekends ago volunteered rescue organization blitz on [place] shelter. It is a kill shelter so took out as many cats as she could. They were given hospital level food and sanitation patient there for three hours a week time flies.  Therapist noted this is good for it sounds like and patient agrees. Showed therapist a picture of Chloe her 68 year old therapist noted she is beautiful. If could have the cat she wants it would be a Education officer, environmental. Patient shares gets pictures from site videos pays a minimal amount of money brings joy and laughter.  Talked about her new cat Abused neglected 47 year old tortoise shell cat-couldn't resist because abused. Last Saturday brought her home. Named her [employer]. Spending time with her has a quiet place crack fresh air and can look outside.  Patient says not pay attention to her and had been in a rush thinks the energy startled ended up whacking her with her paw then bit her  twice. Cellulitis started swelling so got Augmentin  makes her sick, there is also a tear in abdomen-in stomach or intestines. Drugs two months to heal. Threw up lost blood appetite zero hungry and eat something. Not feel good in bed a lot lately. Lost weight no energy hard to focus had to leave work early sick from demented was going to vomit if not don't know if have a job. Cherish job disconcerting had to leave not feeling well so haven't even scheduled did not find out was too sick. Supposed to go in tomorrow. Worried. Share something good chapter 13 in bankruptcy in it for years had to redo in COVID. Good news.  She was given complement but I wanted to people who originally worked to file this they were impressed she is finishing out chapter 13 and not giving up to file for chapter 7 patient says doesn't give up.  Therapist noted this is a very valuable strengths.  She also got a check for thousand because she was on the accelerated payment plan and not the basic payment plan now she will be paying less a month and can save some money.  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 Gave a lot of confidence happy time from a lot of work December 1976  through May 75.  Co. sole up was looking for other work (job offer back in [place] with [employer] so left but already got a job.  Therapist noting this being a  bright piece of her life came only if he gave her $2000 race and help her move.   Patient had good news and therapist pointed we always need to clear that helps with therapy.  Owed 250,000 IRS back debt interest and penalties on $4000 debt when sick and couldn't deal with reality dealing with this. Jori Breeding things related person who had been part of the original plan that she did not owe 300,000 of debt.  Both therapist and patient realized this  was big  Suicidal/Homicidal: No  Plan: Return again in 2 weeks.2.Continue to work past issues right now letting go of anger helping her work on mental health as well as coping with stressors therapist encouraged patient with positive coping such as getting into her drawing 3.  Explored patient is in early remission  Diagnosis: Mood disorder in conditions classified elsewhere, Generalized  anxiety disorder, alcohol use disorder (needs to explore in in remission)  Collaboration of Care: Other none needed  Patient/Guardian was advised Release of Information must be obtained prior to any record release in order to collaborate their care with an outside provider. Patient/Guardian was advised if they have not already done so to contact the registration department to sign all necessary forms in order for us  to release information regarding their care.   Consent: Patient/Guardian gives verbal consent for treatment and assignment of benefits for services provided during this visit. Patient/Guardian expressed understanding and agreed to proceed.   Ronal Sink, LCSW 04/24/2024

## 2024-04-27 ENCOUNTER — Other Ambulatory Visit: Payer: Self-pay | Admitting: Internal Medicine

## 2024-04-27 DIAGNOSIS — I10 Essential (primary) hypertension: Secondary | ICD-10-CM

## 2024-04-30 DIAGNOSIS — Z23 Encounter for immunization: Secondary | ICD-10-CM | POA: Diagnosis not present

## 2024-05-01 ENCOUNTER — Other Ambulatory Visit: Payer: Self-pay

## 2024-05-01 DIAGNOSIS — I25118 Atherosclerotic heart disease of native coronary artery with other forms of angina pectoris: Secondary | ICD-10-CM

## 2024-05-01 MED ORDER — CLOPIDOGREL BISULFATE 75 MG PO TABS: 75.0000 mg | ORAL_TABLET | Freq: Every day | ORAL | 1 refills | Status: DC

## 2024-05-08 ENCOUNTER — Ambulatory Visit (INDEPENDENT_AMBULATORY_CARE_PROVIDER_SITE_OTHER): Admitting: Licensed Clinical Social Worker

## 2024-05-08 DIAGNOSIS — F1020 Alcohol dependence, uncomplicated: Secondary | ICD-10-CM | POA: Diagnosis not present

## 2024-05-08 DIAGNOSIS — F0630 Mood disorder due to known physiological condition, unspecified: Secondary | ICD-10-CM

## 2024-05-08 DIAGNOSIS — F411 Generalized anxiety disorder: Secondary | ICD-10-CM | POA: Diagnosis not present

## 2024-05-08 NOTE — Progress Notes (Signed)
 THERAPIST PROGRESS NOTE  Session Time: 1:00 PM to 1:50 PM  Participation Level: Active  Behavioral Response: CasualAlertpatient dealing with significant stressors session positive as patient has euthymic mood talking about different positive topics  Type of Therapy: Individual Therapy  Treatment Goals addressed: work on coping skills for anxiety depression to decrease symptoms, work on mood management, mood stabilization, coping in general, patient is collaterally managing substance use issues through AA, coping, address significant stressor of state of her house and home repair is really necessary  ProgressTowards Goals: Progressing-patient using therapy processes feelings related to stressors is also enhancement of mood thinking of positive subjects, also continues to process through relationships of the past working on current relationship and starting to talk about ways to cope  Interventions: Solution Focused, Strength-based, Supportive, and Other: Coping  Summary: Lindsay [name] is a 68 y.o. female who presents therapist assesses positive for her to reflect on different things in her life wanted to things that she can remember when she was 68 years old and shared memories therapist thinks she enjoys sharing those memories and as we said her strengths are she can get your interest good storyteller so this is positive.   Therapist wrote this next note down because it is important to therapist patient shared how she makes ice tea and it really does plate important part it helps her not drink.  Therapist impressed with the steps involved saying in general she knows how to make things gourmet.patient shared memory of making things giving for family therapist really thinks does not know many people who could have cooked to that level.  Makes her tea every night. Noted her triggers include tired, hungry and angry especially when tired come home from work knees killing exhausted need to go to bed before  do anything. Where she used to drink when realized no control over decision making then knew had to stop.  Patient said periods of time she ate well learn to cook for herself therapist noted makes life better and patient added also cooking for your family adds to that enjoyment.  Reviewed things were working on in session and patient relates the progress related to relationship with father better place in terms of letting go and forgiveness Now as a place admiring Dad that is a big step and really respected his decisions. Made human mistakes and fired his focus understands she his weaknesses come from.  Talked about personality appearance shared more about mom's personality she would tease them still be a flirt to get him jealous.   Part of patient's process is letting go shares mark on the carpet he would come in after throwing up walk on it needs to let it go because he was having a stroke and is actively doing that.    Patient really wanted to talk about her relationship with Niels today she notices that Niels is Niels reserved around trying not to ask for as much help has done so much for her for so long.  Patient described the engagement and therapist has empathy when talking she will look away away for patient not to interrupt leaves patient blank without anything to say.  Caught her and the active looking away and snorkeling pointed out to her she was mad but it was accurate. She doesn't want to talk. She talks doesn't want her to talk. Meaning she doesn't want patient to talk she wants to be th one to talk. Trying to budget not good budget volunteering needing more  gas more than expected so cutting back with volunteering.  In November working one day a week. Hiring new people regularly do that why may be cutting back. Aggravated not getting the training others getting.  Talked about getting famous people at attention by being in treatment and therapist noted patient has that quality she has interesting in  treating stories to tell.  Refers to interesting thing always has a story about everything she also lets people talk likes learning about them.    Therapist perspective if Niels comes across as judgmental and patient says she is.  Does what her mom does puts her down she gets frustrated when patient comes up to an alternative thinks its criticism.  Patient thinks she gets that from her dad noted patient catching her and doing the same thing putting her down.  Remembers her father would take over what she was doing do it better didn't go through process of discovery.  Patient shares memory of herself to indicating at that time who she still lives likes to wander traveled to the woods around the corner can't resist path. Patient notes Niels sensors her but then does not get the best of her.  Niels is contradiction she is also the most generous therapist seeing how her reaction shows arrogance patient says that is true st of her.  Patient says looks at her as a figure and therapist challenged that patient's not a failure she is the same as her.  Patient has to figure out the balance how to navigate she isn't going to change.  Patient talked about early relationship or playfulness shared interest looked books travel playful interests food travel.  Therapist pointed out as she has gotten older she has changed more rigid and it may be that patient is going to have to change how to responds.  Once the relationship with kind of space she needs from her  Suicidal/Homicidal: No  Plan: Return again in 2 weeks.2.  Continue to process patient's stressors related to relationship with Niels as well as other stressors help with coping strategies for mood management  Diagnosis: Mood disorder in conditions classified elsewhere, Generalized  anxiety disorder, alcohol use disorder (needs to explore in in remission)  Collaboration of Care: Other none needed  Patient/Guardian was advised Release of Information must be  obtained prior to any record release in order to collaborate their care with an outside provider. Patient/Guardian was advised if they have not already done so to contact the registration department to sign all necessary forms in order for us  to release information regarding their care.   Consent: Patient/Guardian gives verbal consent for treatment and assignment of benefits for services provided during this visit. Patient/Guardian expressed understanding and agreed to proceed.   Ronal Sink, LCSW 05/08/2024

## 2024-05-22 ENCOUNTER — Ambulatory Visit (HOSPITAL_COMMUNITY): Admitting: Licensed Clinical Social Worker

## 2024-05-22 DIAGNOSIS — F0630 Mood disorder due to known physiological condition, unspecified: Secondary | ICD-10-CM

## 2024-05-22 DIAGNOSIS — F1090 Alcohol use, unspecified, uncomplicated: Secondary | ICD-10-CM

## 2024-05-22 DIAGNOSIS — F411 Generalized anxiety disorder: Secondary | ICD-10-CM | POA: Diagnosis not present

## 2024-05-22 NOTE — Progress Notes (Signed)
 THERAPIST PROGRESS NOTE  Session Time: 1:00 PM to 1:48 PM  Participation Level: Active  Behavioral Response: CasualAlertAnxious and Dysphoric  Type of Therapy: Individual Therapy  Treatment Goals addressed: work on coping skills for anxiety depression to decrease symptoms, work on mood management, mood stabilization, coping in general, patient is collaterally managing substance use issues through AA, coping, address significant stressor of state of her house and home repair is really necessary  ProgressTowards Goals: Progressing-patient has physical issues lately but worse along with financial issues assess helpful for her to be in session to process for helpful coping as well as using therapy to process thoughts and feelings to help with coping.  Interventions: Solution Focused, Strength-based, Supportive, and Other: coping  Summary: Lindsay [name] is a 68 y.o. female who presents has not been feeling well interfered with her functioning still getting to work although describes today having problems at work and is set on a grocery cart.  Describes symptoms seems could be some heart issues.  Person at work who gave her hard time is now looking out for more which is positive when things she suggested is patient get a card to help with food and therapist thinks is a great idea.  Related they did give her extra day after two days of heart issue.  Takes her AB fib med but has to remember to take her nitroglycerine with her if fast heart beat, that is what she takes for pain issues which is what she has been feeling was feeling that back in September.  Knows it needs to be looked at things will need another stent.   Wants her office back can work there.  Working on that slowly.  Therapist asked to be reminded about her bedroom being cold patient describing again wall, frame siding and anterior wall, dry rotted, installation has black mold have to do can't hire anyone.  Only working 1 day next week in my 1  money.  From bankruptcy was given $150 every week because not working that is taking that money.   Looking at government website looking for assistance.  Also calling [facility] see what assistance they can provide have provided her assistance in the past.  Patient thinks she is going to get back by doing. volunteering with art. Cat thing too much corrupt found out the people who do it. rescue and put in a barn. Doesn't get name and number people who are talking them. Talked about cats rescue some of the cat she is going to have some have issues told therapist about what they look like there stories the when she is very fond of.  Goes Tuesday and Saturday but cut back. Was going more days and can't do that. Not feeling well.  Described what she does and therapist notes is a lot going to cut back which she does but she is still doing a lot scoop litter box, make sure they clean it, check stool report if eating or not wash bowls. Wipe out cages shake up cages to make sure that clear.  Toward end of session patient talked about physical issues given therapist's noting side issues started hurting when fell in August one of her knees hard to walk on favoring the other knee which is causing problems with that knee.  Therapist assesses it helps for patient to have a place to process thoughts and feelings says she really does feel supported from her sessions that helps with  coping    Suicidal/Homicidal: No  Plan: Return again in 2 weeks.2.Continue to process patient's stressors related to relationship with Niels as well as other stressors help with coping strategies for mood management  Diagnosis: Mood disorder in conditions classified elsewhere, Generalized  anxiety disorder, alcohol use disorder (needs to explore in in remission)  Collaboration of Care: Other none needed  Patient/Guardian was advised Release of Information must be obtained prior to any record release  in order to collaborate their care with an outside provider. Patient/Guardian was advised if they have not already done so to contact the registration department to sign all necessary forms in order for us  to release information regarding their care.   Consent: Patient/Guardian gives verbal consent for treatment and assignment of benefits for services provided during this visit. Patient/Guardian expressed understanding and agreed to proceed.   Ronal Sink, LCSW 05/22/2024

## 2024-06-05 ENCOUNTER — Ambulatory Visit (HOSPITAL_COMMUNITY): Admitting: Licensed Clinical Social Worker

## 2024-06-05 DIAGNOSIS — F1090 Alcohol use, unspecified, uncomplicated: Secondary | ICD-10-CM

## 2024-06-05 DIAGNOSIS — F0630 Mood disorder due to known physiological condition, unspecified: Secondary | ICD-10-CM

## 2024-06-05 DIAGNOSIS — F411 Generalized anxiety disorder: Secondary | ICD-10-CM

## 2024-06-05 NOTE — Progress Notes (Signed)
 Patient mixed appointment came an hour later scheduled for tomorrow no-show but will not charge since she came here for appointment

## 2024-06-06 ENCOUNTER — Ambulatory Visit (INDEPENDENT_AMBULATORY_CARE_PROVIDER_SITE_OTHER): Admitting: Licensed Clinical Social Worker

## 2024-06-06 DIAGNOSIS — F0630 Mood disorder due to known physiological condition, unspecified: Secondary | ICD-10-CM

## 2024-06-06 DIAGNOSIS — F1090 Alcohol use, unspecified, uncomplicated: Secondary | ICD-10-CM

## 2024-06-06 DIAGNOSIS — F411 Generalized anxiety disorder: Secondary | ICD-10-CM

## 2024-06-06 NOTE — Progress Notes (Signed)
 Virtual Visit via Video Note  I connected with Lindsay [name] on 06/06/24 at  9:00 AM EST by a video enabled telemedicine application and verified that I am speaking with the correct person using two identifiers.  Location: Patient: home Provider: home office   I discussed the limitations of evaluation and management by telemedicine and the availability of in person appointments. The patient expressed understanding and agreed to proceed.   I discussed the assessment and treatment plan with the patient. The patient was provided an opportunity to ask questions and all were answered. The patient agreed with the plan and demonstrated an understanding of the instructions.   The patient was advised to call back or seek an in-person evaluation if the symptoms worsen or if the condition fails to improve as anticipated.  I provided 51 minutes of non-face-to-face time during this encounter.  THERAPIST PROGRESS NOTE  Session Time: 9:00 AM to 9:51 AM  Participation Level: Active  Behavioral Response: CasualAlertAnxious  Type of Therapy: Individual Therapy  Treatment Goals addressed: work on pharmacologist for anxiety depression to decrease symptoms, work on mood management, mood stabilization, coping in general, patient is collaterally managing substance use issues through AA, coping, address significant stressor of state of her house and home repair is really necessary  ProgressTowards Goals: Progressing-patient has significant stressors with finances work therapist guided patient in processing thoughts and feelings help with coping as well as doing some reframing, noticing patient's strengths as well for coping strategies  Interventions: Solution Focused, Strength-based, Supportive, Reframing, and Other: Coping  Summary: Lindsay [name] is a 68 y.o. female who presents with got a letter from company owe $89,000 if don't pay seize social security and income nobody has thought about collecting what owed.  Nobody has done something to now. Scary who to contact. Never heard of company.  Therapist noted did not sound right that accompany could come after you after all this time could be wrong not an area of expertise but did not sound right in for patient to do the research and be careful.  Patient plans on doing research looking into it after do stuff in yard before gets bad get some air and sun. Owed more because of taxes and interest wants to call Jori her lawyer she was good enough to spend serious time patient wishes had letter from [employer]. Jori guided her through terror with federal taxes let patient know people she owed would have contacted since they didn't respond she didn't owe. Can't sleep stomach upset and crying. Freezing half the wall is missing. The good thing is that Chloe will get under the cover first thing get heating pad. Lifting things no strength in right arm getting caught bad sign it means it is worse. This may be the last time blow leaves. What did to cause this problem was swept to get the leaves out of the way.  Brought another cat has Victorine, new one is Development Worker, Community older than people thought why did she get another? The cat in cage wouldn't come out. She got her because older and the quality of life living. In her bathroom bigger cage open the window she is learning to trust more issues with fur knots on her painful won't touch her, she is afraid of being touched. Both have appointments to get claws done and groomed.  Somebody else ran out without paying. Four people walk out not paying and then the next guy comes along didn't get his receipt then  woman after the total came out on his card. Happened before. Can handle things with buggy but if not they can run out without paying. Worried about fired mining engineer. Overwhelmed when too much going on. Because of receipt issue brought closer to her anybody do anything will see receipt because closer grab and give them help with  people stealing as well as other issue of not charging the next person from what the person before bought. Some guy steals and goes on another person that is bad. Great with customer but manager said this has to stop. Therapist noted she has a lot to deal with with people stealing. Now she has fear of losing job and crazy loan thing two weeks to figure out. Therapist reiterated can't come out of the blue owe things and patient says has three weeks.     Discussed current state of things and the corruption different ways there is corruption. Patient says anger against these people who are corrupt.  Thinking about mom's skills cook, frugal could sew like a tailor. Upholstered a piece of furniture. Did things herself which usually done by specialist. She taught patient to sew at one time could do anything.  Woke up with sick feeling sure going into depression.  Patient identifies her own strengths that therapist reiterated always time to make a different therapist noted having that quality of persistence not giving up encouraged patient to do with therapist has been doing to recognize something kind about her self every day and therapist likes that idea.  What make a difference for patient is anticipate failure was around her stagflation. Always fear of homeless. Retreat into art, reading, gardening, listening to classic music when young. Didn't realize how close to homeless came to being homeless dad didn't pay rent for a year didn't tell mom found out from owner of house. Knew family so was nice mom had to sell wedding silver to get the money.  Used to fight for things doesn't have resources now acceptance still proactive starting to congratulate herself cleaning even if 30 minutes. Printer and old conditioners gone didn't have to pay for it to be taken. Paid for printer. Lifting the air conditioner out of window. He plans to come and fix wall and window.  Both therapist and patient noted good deed still happen  helps with mental health in session processed patient's feelings as dealing with significant stressors reframing to not perceive things as black-and-white other possibilities that could happen also reinforcing her strengths of being a survivor that she will apply to the situation.            Suicidal/Homicidal: No  Plan: Return again in 1 week.2.Continue to process patient's stressors related to relationship with Niels as well as other stressors help with coping strategies for mood management  Diagnosis:  Mood disorder in conditions classified elsewhere, Generalized  anxiety disorder, alcohol use disorder (needs to explore in in remission) Collaboration of Care: Other none needed  Patient/Guardian was advised Release of Information must be obtained prior to any record release in order to collaborate their care with an outside provider. Patient/Guardian was advised if they have not already done so to contact the registration department to sign all necessary forms in order for us  to release information regarding their care.   Consent: Patient/Guardian gives verbal consent for treatment and assignment of benefits for services provided during this visit. Patient/Guardian expressed understanding and agreed to proceed.   Ronal Sink, LCSW 06/06/2024

## 2024-06-11 ENCOUNTER — Ambulatory Visit (INDEPENDENT_AMBULATORY_CARE_PROVIDER_SITE_OTHER): Admitting: Licensed Clinical Social Worker

## 2024-06-11 ENCOUNTER — Ambulatory Visit (HOSPITAL_COMMUNITY): Admitting: Licensed Clinical Social Worker

## 2024-06-11 DIAGNOSIS — F0630 Mood disorder due to known physiological condition, unspecified: Secondary | ICD-10-CM | POA: Diagnosis not present

## 2024-06-11 DIAGNOSIS — F1090 Alcohol use, unspecified, uncomplicated: Secondary | ICD-10-CM

## 2024-06-11 DIAGNOSIS — F411 Generalized anxiety disorder: Secondary | ICD-10-CM

## 2024-06-11 NOTE — Progress Notes (Signed)
 THERAPIST PROGRESS NOTE  Session Time: 9:00 AM to 9:45 AM  Participation Level: Active  Behavioral Response: CasualAlertappropriate  Type of Therapy: Individual Therapy  Treatment Goals addressed: work on pharmacologist for anxiety depression to decrease symptoms, work on mood management, mood stabilization, coping in general, patient is collaterally managing substance use issues through AA, coping, address significant stressor of state of her house and home repair is really necessary   ProgressTowards Goals: Progressing-patient noted key piece of therapy uncovering things and putting the pieces together and overall perspective helpful day-to-day coping helpful for what she describes as suicidal thoughts being able to refocus on positive  Interventions: Motivational Interviewing, Solution Focused, Strength-based, Supportive, Reframing, and Other: Coping  Summary: Lindsay [name] is a 68 y.o. female who presents with somebody came and fixed wall in bedroom.  Did appropriate about therapist and patient realized helpline that was patient says need more work but it is better. They pulled out rotted pieces under the window one to right needs support needs to be connection patient says put a block there. Vertical support beam rotted. One below window goes below floor line. Took a couple of hours to dust and clean the room.  Needs more work but a lot better for now Love the stoics what can you do to make it better, is there question?  Didn't sleep well due to pain issues. Took all meds. Lifted something too heavy for her messed up right shoulder so pops and grinds all the time can't lift certain things pops in and out does damage. Want to blow weed but can't hold the tool.  Can move around office.  Therapist asked if this helps her mental health as this has been a long-term project that has been hard for her to get going it seems like she is making progress and patient says helps mental health feels doing  something not just laying in bed feeling sorry for herself.  Discussed current state of affairs and the stress of it. Patient deals with it every day cut back looking at it.  Both therapist and patient is related to the stress of being.  When you look in history what is happening patient says people apes moral part we can access depend how brain develops people can rise above environment if moral core if have empathy.  Therapist sees that as well Talked about drinking has been a long time and only picked up here and there describes how it affects her dulls mind so thinking stops thinking can be painful but feels worse wake up depression like a gut sinking feeling like you are sinking. Doesn't like feeling that way self-inducing this awful feeling.  Therapist assesses these comments motivational statements motivational interviewing intervention Trying to spend more time doing something useful not on Ipod so not so passive in her life.  Patient talked about following somebody who could relate to her experience therapist interested patient gave resources.  Patient ways relates to someone quieting the chatter. On AA path took years to fully sink in it was about giving up control couldn't trust her own brain real blow to sense of efficacy. Look at crossroads precipitous and gone the wrong way and died made the right choice choose life.  Patient believes send philosophy libertine believes in more choice in individual. Fallacies in that approach but that belief got to other things ran for office people need other choices. Letter to ask her to pay student loan working with Niels on this therapist  did not believe it to be truthful but patient says it is but working to wear would owe o or what can pay. Program end 2026 forbearance that Biden instituted. Looking into extended out the deadline for her to gather info has a month or two. Work on organizing in office walking space know where is at and what have.  Patient  wanted to let therapist know what helpful sessions are helping her to fight ongoing suicidal thoughts but why would she adopt cats. Therapist added too many may Good Things in Life and patient agrees.  In reviewing interventions by this therapist noted therapy helpful for patient to stay positive move forward in a positive way to help with her mental health as she discusses being suicidal but would not act on it but helps to keep that in check.  Therapist spent time in session noting the positives for patient to have perspective of life moving in a positive direction for her.      Suicidal/Homicidal: No  Plan: Return again in 1 weeks.2.Continue to process patient's stressors related to relationship with Niels as well as other stressors help with coping strategies for mood management  Diagnosis: Mood disorder in conditions classified elsewhere, Generalized  anxiety disorder, alcohol use disorder   Collaboration of Care: Other none needed  Patient/Guardian was advised Release of Information must be obtained prior to any record release in order to collaborate their care with an outside provider. Patient/Guardian was advised if they have not already done so to contact the registration department to sign all necessary forms in order for us  to release information regarding their care.   Consent: Patient/Guardian gives verbal consent for treatment and assignment of benefits for services provided during this visit. Patient/Guardian expressed understanding and agreed to proceed.   Ronal Sink, LCSW 06/11/2024

## 2024-06-13 DIAGNOSIS — M25512 Pain in left shoulder: Secondary | ICD-10-CM | POA: Diagnosis not present

## 2024-06-13 DIAGNOSIS — Z5181 Encounter for therapeutic drug level monitoring: Secondary | ICD-10-CM | POA: Diagnosis not present

## 2024-06-13 DIAGNOSIS — M51369 Other intervertebral disc degeneration, lumbar region without mention of lumbar back pain or lower extremity pain: Secondary | ICD-10-CM | POA: Diagnosis not present

## 2024-06-13 DIAGNOSIS — M545 Low back pain, unspecified: Secondary | ICD-10-CM | POA: Diagnosis not present

## 2024-06-13 DIAGNOSIS — M5416 Radiculopathy, lumbar region: Secondary | ICD-10-CM | POA: Diagnosis not present

## 2024-06-19 ENCOUNTER — Ambulatory Visit (HOSPITAL_COMMUNITY): Admitting: Licensed Clinical Social Worker

## 2024-06-19 DIAGNOSIS — F0630 Mood disorder due to known physiological condition, unspecified: Secondary | ICD-10-CM

## 2024-06-19 DIAGNOSIS — F411 Generalized anxiety disorder: Secondary | ICD-10-CM | POA: Diagnosis not present

## 2024-06-19 DIAGNOSIS — F1090 Alcohol use, unspecified, uncomplicated: Secondary | ICD-10-CM

## 2024-06-19 NOTE — Progress Notes (Signed)
 Virtual Visit via Video Note  I connected with Lindsay [name] on 06/19/24 at  2:00 PM EST by a video enabled telemedicine application and verified that I am speaking with the correct person using two identifiers.  Location: Patient: Stationary car Provider: office   I discussed the limitations of evaluation and management by telemedicine and the availability of in person appointments. The patient expressed understanding and agreed to proceed.   I discussed the assessment and treatment plan with the patient. The patient was provided an opportunity to ask questions and all were answered. The patient agreed with the plan and demonstrated an understanding of the instructions.   The patient was advised to call back or seek an in-person evaluation if the symptoms worsen or if the condition fails to improve as anticipated.  I provided 45 minutes of non-face-to-face time during this encounter.  THERAPIST PROGRESS NOTE  Session Time: 2:00 PM to 2:45 PM  Participation Level: Active  Behavioral Response: CasualAlertmood improved positive reports to tell therapist  Type of Therapy: Individual Therapy  Treatment Goals addressed: work on coping skills for anxiety depression to decrease symptoms, work on mood management, mood stabilization, coping in general, patient is collaterally managing substance use issues through AA, coping, address significant stressor of state of her house and home repair is really necessary  ProgressTowards Goals: Progressing-positive developments assess helpful for mood helpful for patient to process thoughts and feelings in session help with coping  Interventions: Solution Focused, Strength-based, Supportive, and Other: coping  Summary: Lindsay [name] is a 68 y.o. female who presents with working for tomorrow and Thursday.  Asked therapist how her Thanksgiving would be and therapist described it is low-key looking forward to time off therapist asked about her plans patient said  order unfrozen turkey don't have time to do what like picking up today. Niels having Thanksgiving so will do with her on Saturday. Whoosie uncomfortable night.  Patient shares with good news and therapist very enthusiastic and good news for patient as there are a lot of stressors in her life and she said Niels showed up with [place] bought [place] these are the keys it is a bigger car and bigger trunk more safety features more comfortable beautiful can keep all unwashed laundry. Drives well.  A couple minor fixes patient explain able to get the car because got money back on policy for service and and trade in of patient's car. Patient points out shows what kind of person she is and therapist agrees. Therapist also glad and she shows a good friendship and has a person to support her.  Patient may pick up turkey on Friday not sure likes to really prepare for it so not sure if she will get it or not.   Conversation included about current state of affairs how people in charge some of them  working to put in place a fascist dictatorship. In general both patient therapist talked about concerns with current events. Grateful keeping her on from job establish two protocols at desk help with issues she has been having.  Patient in general says plans on having more discipline there. Patient is responsible and tell them that she has a system for bagging and ringing up reason and polite. Manager complimented her on this.  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  What looking forward to Thanksgiving just relaxing. Patient is cooking the pumpkin pie.  Found out cold shoulder Bari and [place] mutual friends with Niels some months ago said something unforgivable when drunk  when had to put 75 year old cat down. Where evil person come from? Motivate to not drink when heard that. Patient says normally a kind person would not say something like that no apology would ever make up for it leave it alone quiet about it. Struck her bad  depression last week liked them so much known for years.  Therapist normalized this a little bit herself hard to know what stuff that she was she had not done part of the human condition.  Not for patient to feel so alone with that.  As we switched to different topics patient mind on some sad things sharing these stories of person died by suicide. A neighbor and a cousin.  Patient says she suffers but would not do something like that know how it hurts people wouldn't abandon people. Sick to stomach when heard what she said to friends asked her who I am.  Therapist noted people change parts of their brain or shut down when drinking part of what happens.  Patient relates when found out bipolar depression for years couldn't sleep and drugs from doctors thought about killing herself did commit herself. What happened Rolan called Niels and was really damaging herself with head. Niels calmed her down asked alright if called hospital went in 72 hours and then committed to AA depression fueled by meds psychiatrist gave her and didn't know was drinking. Surrender that stuff.  Mostly is reframing something she thinks about basics to be grateful for waking up especially when older patient agrees.  Has pain issues uses her tools to bad she takes meds. Starting PT will be over a [facility] more.  Therapist provided supportive interventions active listening        Suicidal/Homicidal: No  Plan: Return again in 2 weeks.2.Continue to process patient's stressors related to relationship with Niels as well as other stressors help with coping strategies for mood management  Diagnosis: Mood disorder in conditions classified elsewhere, Generalized  anxiety disorder, alcohol use disorder   Collaboration of Care: Other none needed  Patient/Guardian was advised Release of Information must be obtained prior to any record release in order to collaborate their care with an outside provider. Patient/Guardian was advised if they have not  already done so to contact the registration department to sign all necessary forms in order for us  to release information regarding their care.   Consent: Patient/Guardian gives verbal consent for treatment and assignment of benefits for services provided during this visit. Patient/Guardian expressed understanding and agreed to proceed.   Ronal Sink, LCSW 06/19/2024

## 2024-06-26 ENCOUNTER — Ambulatory Visit: Admitting: Rehabilitative and Restorative Service Providers"

## 2024-06-27 ENCOUNTER — Telehealth: Payer: Self-pay

## 2024-06-27 NOTE — Telephone Encounter (Signed)
   Pre-operative Risk Assessment    Patient Name: Lindsay [name]  DOB: 04/15/1956 MRN: [id number]   Date of last office visit: 10/18/23  Dr. Mona Date of next office visit: NA   Request for Surgical Clearance    Procedure:  Selective nerve root block (PROC) bilateral L5 SNRB; left>right side for pain level prior to  Date of Surgery:  Clearance TBD                               Surgeon:  Dr. Bonner Surgeon's Group or Practice Name:  [facility] Phone number:  [phone] Fax number:  [phone]   Type of Clearance Requested:   - Medical  - Pharmacy:  Hold Clopidogrel  (Plavix ) for 7 days prior to   Type of Anesthesia:  Not Indicated   Additional requests/questions:    Bonney Arlyne LITTIE Kallie   06/27/2024, 5:26 PM

## 2024-06-28 ENCOUNTER — Other Ambulatory Visit: Payer: Self-pay

## 2024-06-28 ENCOUNTER — Ambulatory Visit: Attending: Family Medicine

## 2024-06-28 DIAGNOSIS — R2689 Other abnormalities of gait and mobility: Secondary | ICD-10-CM | POA: Insufficient documentation

## 2024-06-28 DIAGNOSIS — R29898 Other symptoms and signs involving the musculoskeletal system: Secondary | ICD-10-CM | POA: Diagnosis present

## 2024-06-28 DIAGNOSIS — M25511 Pain in right shoulder: Secondary | ICD-10-CM

## 2024-06-28 DIAGNOSIS — M25562 Pain in left knee: Secondary | ICD-10-CM | POA: Diagnosis present

## 2024-06-28 DIAGNOSIS — M6281 Muscle weakness (generalized): Secondary | ICD-10-CM | POA: Diagnosis present

## 2024-06-28 DIAGNOSIS — M5459 Other low back pain: Secondary | ICD-10-CM

## 2024-06-28 DIAGNOSIS — R262 Difficulty in walking, not elsewhere classified: Secondary | ICD-10-CM | POA: Insufficient documentation

## 2024-06-28 DIAGNOSIS — G8929 Other chronic pain: Secondary | ICD-10-CM | POA: Diagnosis present

## 2024-06-28 DIAGNOSIS — M25561 Pain in right knee: Secondary | ICD-10-CM | POA: Diagnosis present

## 2024-06-28 NOTE — Telephone Encounter (Signed)
   Name: Lindsay [name]  DOB: Dec 09, 1955  MRN: [id number]  Primary Cardiologist: None   Preoperative team, please contact this patient and set up a phone call appointment for further preoperative risk assessment. Please obtain consent and complete medication review. Thank you for your help.  I confirm that guidance regarding antiplatelet and oral anticoagulation therapy has been completed and, if necessary, noted below.  Per office protocol, if patient is without any new symptoms or concerns at the time of their virtual visit, she may hold Plavix  for 5 days prior to procedure. Please resume Plavix  as soon as possible postprocedure, at the discretion of the surgeon.    I also confirmed the patient resides in the state of [place] . As per [facility] Medical Board telemedicine laws, the patient must reside in the state in which the provider is licensed.   Lamarr Satterfield, NP 06/28/2024, 8:00 AM [facility]

## 2024-06-28 NOTE — Therapy (Cosign Needed)
 OUTPATIENT PHYSICAL THERAPY EVALUATION   Patient Name: Lindsay [name] MRN: [id number] DOB:[birth year], 68 y.o., female Today's Date: 06/29/2024  END OF SESSION:  Lindsay [name] End of Session - 06/28/24 1742     Visit Number 1    Number of Visits 17    Date for Recertification  08/25/24    Authorization Type Medicare Part A and B    Progress Note Due on Visit 10    Lindsay [name] Start Time 1110   patient late   Lindsay [name] Stop Time 1142    Lindsay [name] Time Calculation (min) 32 min    Activity Tolerance Patient limited by pain    Behavior During Therapy Anxious          Past Medical History:  Diagnosis Date   Abnormal mammogram    Thick Tissue   Acquired trigger finger of right ring finger 04/20/2022   Arthritis    Bilateral wrist pain 04/20/2022   Bipolar 1 disorder    Cataract    OU   Cervical radiculopathy 10/14/2021   Chalazion of left lower eyelid 04/15/2022   Chest pain 02/11/2015   Chronic atrial fibrillation 09/08/2015   Colon polyp    Compressed cervical disc    Coronary artery disease involving native coronary artery with angina pectoris 03/24/2015   Dorsalgia 09/22/2018   Dysphagia 09/22/2018   Essential hypertension 03/03/2015   Generalized anxiety disorder    GERD without esophagitis 09/22/2018   Hematemesis 12/18/2019   Hepatic steatosis 04/22/2022   History of appendectomy 1963   Hypertensive retinopathy    OU   Irritable bowel syndrome without diarrhea 09/22/2018   Lactic acidosis 12/18/2019   LLQ pain 04/15/2022   Major depressive disorder    Migraine without status migrainosus, not intractable 09/22/2018   Mixed hyperlipidemia 05/07/2016   Non-ST elevation MI (NSTEMI) 07/26/2008   No cardiac catheterization at the time.   Obstructive sleep apnea    no CPAP use   Orthostasis    Osteoarthritis of carpometacarpal (CMC) joint of thumb 04/20/2022   Osteoarthritis of knee 09/22/2018   PAF (paroxysmal atrial fibrillation) 10/09/2018   Pain in joint of right hip 07/03/2020   Pain in  right knee 12/14/2018   Recurrent syncope    Rib pain 07/03/2020   Sciatica of right side 09/22/2018   Severe alcohol use disorder    Vitamin D  deficiency    Past Surgical History:  Procedure Laterality Date   APPENDECTOMY     CARDIAC CATHETERIZATION N/A 03/25/2015   Procedure: Left Heart Cath and Coronary Angiography;  Surgeon: Alm LELON Clay, MD;  Location: [facility] INVASIVE CV LAB;  Service: Cardiovascular;  Laterality: N/A;   CORONARY STENT INTERVENTION N/A 10/09/2018   Procedure: CORONARY STENT INTERVENTION;  Surgeon: Dann Candyce RAMAN, MD;  Location: [facility] INVASIVE CV LAB;  Service: Cardiovascular;  Laterality: N/A;   DILATATION & CURETTAGE/HYSTEROSCOPY WITH MYOSURE N/A 05/19/2016   Procedure: DILATATION & CURETTAGE/HYSTEROSCOPY WITH MYOSURE;  Surgeon: Hargis Paradise, MD;  Location: [facility];  Service: Gynecology;  Laterality: N/A;  Possible Myosure for polyps.   EYE SURGERY     LEFT HEART CATH AND CORONARY ANGIOGRAPHY N/A 10/09/2018   Procedure: LEFT HEART CATH AND CORONARY ANGIOGRAPHY;  Surgeon: Dann Candyce RAMAN, MD;  Location: [facility] INVASIVE CV LAB;  Service: Cardiovascular;  Laterality: N/A;   MOUTH SURGERY     URETHRAL DILATION     Patient Active Problem List   Diagnosis Date Noted   B12 deficiency 10/24/2023   Seasonal allergies 10/21/2023   Gastroesophageal reflux  disease without esophagitis 10/21/2023   Epigastric pain 10/21/2023   Stress 02/02/2023   Insomnia 12/23/2022   Blood clotting disorder 10/28/2022   Neck pain 10/27/2022   Acute thoracic back pain 10/27/2022   Lumbar radiculopathy 05/26/2022   Degeneration of lumbar intervertebral disc 05/26/2022   Major depressive disorder    Severe alcohol use disorder    Generalized anxiety disorder    Obstructive sleep apnea    Hepatic steatosis 04/22/2022   Osteoarthritis of carpometacarpal (CMC) joint of thumb 04/20/2022   Acquired trigger finger of right ring finger 04/20/2022   Bilateral wrist pain 04/20/2022    Chalazion of left lower eyelid 04/15/2022   Bipolar 1 disorder (HCC)    Cervical radiculopathy 10/14/2021   Pain in joint of right hip 07/03/2020   Rib pain 07/03/2020   Recurrent syncope    Orthostasis    Lactic acidosis 12/18/2019   Hematemesis 12/18/2019   Pain in right knee 12/14/2018   PAF (paroxysmal atrial fibrillation) 10/09/2018   Dorsalgia 09/22/2018   Dysphagia 09/22/2018   GERD without esophagitis 09/22/2018   Irritable bowel syndrome without diarrhea 09/22/2018   Osteoarthritis of knee 09/22/2018   Polypharmacy 09/22/2018   Sciatica of right side 09/22/2018   Vitamin D  deficiency 09/22/2018   Mixed hyperlipidemia 05/07/2016   Chronic atrial fibrillation (HCC) 09/08/2015   Coronary artery disease involving native coronary artery with angina pectoris 03/24/2015   Essential hypertension 03/03/2015   Chest pain 02/11/2015   Non-ST elevation MI (NSTEMI) (HCC) 07/26/2008    PCP: Benton Gave, PA  REFERRING PROVIDER: Lauraine Hamlet, FNP  REFERRING DIAG: left shoulder and low back pain (patient on provider's notes confirm that it is the right shoulder)   THERAPY DIAG:  Muscle weakness (generalized)  Other symptoms and signs involving the musculoskeletal system  Other abnormalities of gait and mobility  Rationale for Evaluation and Treatment: Rehabilitation  ONSET DATE: 2 years ago (shoulder) (Back): Fall several years ago  SUBJECTIVE:                                                                                                                                                                                      SUBJECTIVE STATEMENT: Lindsay [name] reports she hurts all over, stating the onset of her right shoulder pain occurred 2 years ago from an accident. She does not want to undergo surgery, even though this has been recommended. Reports her back pain is due to falling several years ago. 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 She  wants to improve her pain so she can continue to work her garden. No red flag  symptoms.   Hand dominance: Right  PERTINENT HISTORY: See extensive PMH above   PAIN:  Are you having pain? Yes: NPRS scale: 9/10 worst, 3/10 currently  Pain location: multi-joint pain Pain description: sharp, dull, ache  Aggravating factors: standing for hours, stairs, reaching, walking Relieving factors: Pressure point therapy , hard ball, vibrating bed   PRECAUTIONS: Fall  RED FLAGS: None   WEIGHT BEARING RESTRICTIONS: No  FALLS:  Has patient fallen in last 6 months? Yes. Number of falls 2x cannot see very well and was in a hurry   LIVING ENVIRONMENT: Lives with: lives alone Lives in: House/apartment Stairs: Yes: Internal: 3 stories steps; can reach both Has following equipment at home: None  OCCUPATION: Employee at [employer]   PLOF: Independent  PATIENT GOALS: Capacity to maintain my garden and keep my house  NEXT MD VISIT: TBD  OBJECTIVE:  Note: Objective measures were completed at Evaluation unless otherwise noted.    PATIENT SURVEYS :  PSFS: THE PATIENT SPECIFIC FUNCTIONAL SCALE  Place score of 0-10 (0 = unable to perform activity and 10 = able to perform activity at the same level as before injury or problem)  Activity Date: 06/28/24    Able to kneel  0    2.  Walk into grocery store without LBP 0    3.  Reach for dish in cabinet  0         Total Score 0      Total Score = Sum of activity scores/number of activities  Minimally Detectable Change: 3 points (for single activity); 2 points (for average score)  [facility] (nd). The Patient Specific Functional Scale . Retrieved from [name]  Cannot kneel down  COGNITION: Overall cognitive status: Within functional limits for tasks assessed       POSTURE: Forward head posture  Lumbar ROM: Limited 75% extension pain Flexion WFL  pain Left flexion 25% limited pain Right flexion 25% limited pain  UPPER EXTREMITY ROM:   Active ROM Right eval Left eval  Shoulder flexion 50 active 120 passive WFL   Shoulder extension [facility]  Shoulder abduction 55 deg active 128 passive 105 deg   Shoulder adduction    Shoulder internal rotation [facility] WFL  Shoulder external rotation Limited 50% WFL  Elbow flexion    Elbow extension    Wrist flexion    Wrist extension    Wrist ulnar deviation    Wrist radial deviation    Wrist pronation    Wrist supination    (Blank rows = not tested)  UPPER EXTREMITY MMT:  MMT Right eval Left eval  Shoulder flexion 3-   Shoulder extension 3+   Shoulder abduction 3-   Shoulder adduction    Shoulder internal rotation 4-   Shoulder external rotation 3+   Middle trapezius    Lower trapezius    Elbow flexion    Elbow extension    Wrist flexion    Wrist extension    Wrist ulnar deviation    Wrist radial deviation    Wrist pronation    Wrist supination    Grip strength (lbs)    (Blank rows = not tested) LOWER EXTREMITY MMT:    MMT Right eval Left eval  Hip flexion 4 4  Hip extension    Hip abduction    Hip adduction    Hip internal rotation    Hip external rotation    Knee flexion 5 4- pain   Knee extension 4  4  Ankle dorsiflexion    Ankle plantarflexion    Ankle inversion    Ankle eversion     (Blank rows = not tested)   Functional testing: Tandem stance: 8 sec before balance loss Single limb stance bilaterally: unable  Sit to stands 19.11 sec                                                                                            TREATMENT DATE:  Eval only- no time to issue HEP   PATIENT EDUCATION: Education details: POC Person educated: Patient Education method: Programmer, Multimedia, Demonstration, Tactile cues, and Verbal cues Education comprehension: verbalized understanding, returned demonstration, verbal cues required, tactile cues required, and needs  further education  HOME EXERCISE PROGRAM: No time to issue at eval   ASSESSMENT:  CLINICAL IMPRESSION: Patient is a 68 y.o. female who was seen today for physical therapy evaluation and treatment for Right shoulder pain and low back pain, though patient endorses multi-joint pain that is severely limiting her overall functional capacity. Upon assessment Lindsay [name] severely limited right shoulder ROM, however passively close to [facility]. Lindsay [name] unable to hold tandem stance longer than 5 sec, unable to complete single limb stance bilaterally. She also has lower extremity and upper extremity strength deficits. Lindsay [name] loves working at her job, and wants to get better so she can work with out pain, upkeep her house, and continue treating her garden. Lindsay [name] will benefit from skilled therapy to address the deficits below.    OBJECTIVE IMPAIRMENTS: Abnormal gait, cardiopulmonary status limiting activity, decreased activity tolerance, decreased balance, decreased cognition, decreased coordination, decreased endurance, decreased mobility, difficulty walking, decreased ROM, decreased strength, increased fascial restrictions, increased muscle spasms, impaired flexibility, impaired UE functional use, improper body mechanics, postural dysfunction, and pain.   ACTIVITY LIMITATIONS: carrying, lifting, bending, standing, squatting, sleeping, stairs, transfers, bed mobility, bathing, toileting, dressing, and reach over head  PARTICIPATION LIMITATIONS: meal prep, cleaning, laundry, driving, community activity, occupation, and yard work  PERSONAL FACTORS: Age, Behavior pattern, Past/current experiences, Time since onset of injury/illness/exacerbation, and 3+ comorbidities: Previous PMH with shoulder surgeries are also affecting patient's functional outcome.   REHAB POTENTIAL: Poor see personal factors above, chronicity of injuries.   CLINICAL DECISION MAKING: Unstable/unpredictable  EVALUATION COMPLEXITY: High  GOALS: Goals reviewed  with patient? Yes  SHORT TERM GOALS: Target date: 07/27/2024    Lindsay [name] will be efficient with initial HEP so she has increased independence for everyday activities.  Baseline: no time to issue  Goal status: INITIAL  2.  Lindsay [name] will improve PSFS score by at least 3 points to improve overall endurance to walk from car to grocery store and back with less than or =5/10 reports of pain  Baseline: 0 Goal status: INITIAL  3.  Lindsay [name] will be able to reach for objects at chest height with right shoulder so she improves functional capacity for painting and reaching for items at grocery store Baseline: unable Goal status: INITIAL  4. Patient will complete 5 x STS in </= 15 seconds to improve functional strength.   Baseline: see above   Goal status: INITIAL    LONG TERM  GOALS: Target date: 08/25/24  Lindsay [name] will be efficient with advanced HEP so she has increased independence for everyday activities.  Baseline:  Goal status: INITIAL  2.  Lindsay [name] will maintain tandem stance  longer than 10 sec without UE support so she has improved balance and is a decreased fall risk Baseline: Unable Goal status: INITIAL  3.  Lindsay [name] will perform kneeling to the floor from standing using light UE support so she improves functional capacity for gardening and household chores Baseline: unable Goal status: INITIAL    PLAN: Lindsay [name] FREQUENCY: 1-2x/week  Lindsay [name] DURATION: 8 weeks  PLANNED INTERVENTIONS: 97164- Lindsay [name] Re-evaluation, 97750- Physical Performance Testing, 97110-Therapeutic exercises, 97530- Therapeutic activity, [id number]- Neuromuscular re-education, 97535- Self Care, [id number]- Manual therapy, [id number]- Gait training, [phone]- Aquatic Therapy, [phone] (1-2 muscles), 20561 (3+ muscles)- Dry Needling, Patient/Family education, Balance training, Stair training, Cryotherapy, and Moist heat  PLAN FOR NEXT SESSION: ISSUE HEP- NO TIME AT EVAL; shoulder ISO, shoulder mobility; functional strengthening.   Lavanda Cleverly, SPT  Lucie Meeter, Lindsay [name], DPT,  ATC 06/29/24 9:55 AM

## 2024-06-28 NOTE — Therapy (Deleted)
 OUTPATIENT PHYSICAL THERAPY LOWER EXTREMITY TREATMENT AND DISCHARGE SUMMARY   Patient Name: Lindsay [name] MRN: [id number] DOB:May 17, 1956, 68 y.o., female Today's Date: 06/28/2024  END OF SESSION:   Past Medical History:  Diagnosis Date   Abnormal mammogram    Thick Tissue   Acquired trigger finger of right ring finger 04/20/2022   Arthritis    Bilateral wrist pain 04/20/2022   Bipolar 1 disorder    Cataract    OU   Cervical radiculopathy 10/14/2021   Chalazion of left lower eyelid 04/15/2022   Chest pain 02/11/2015   Chronic atrial fibrillation 09/08/2015   Colon polyp    Compressed cervical disc    Coronary artery disease involving native coronary artery with angina pectoris 03/24/2015   Dorsalgia 09/22/2018   Dysphagia 09/22/2018   Essential hypertension 03/03/2015   Generalized anxiety disorder    GERD without esophagitis 09/22/2018   Hematemesis 12/18/2019   Hepatic steatosis 04/22/2022   History of appendectomy 1963   Hypertensive retinopathy    OU   Irritable bowel syndrome without diarrhea 09/22/2018   Lactic acidosis 12/18/2019   LLQ pain 04/15/2022   Major depressive disorder    Migraine without status migrainosus, not intractable 09/22/2018   Mixed hyperlipidemia 05/07/2016   Non-ST elevation MI (NSTEMI) 07/26/2008   No cardiac catheterization at the time.   Obstructive sleep apnea    no CPAP use   Orthostasis    Osteoarthritis of carpometacarpal (CMC) joint of thumb 04/20/2022   Osteoarthritis of knee 09/22/2018   PAF (paroxysmal atrial fibrillation) 10/09/2018   Pain in joint of right hip 07/03/2020   Pain in right knee 12/14/2018   Recurrent syncope    Rib pain 07/03/2020   Sciatica of right side 09/22/2018   Severe alcohol use disorder    Vitamin D  deficiency    Past Surgical History:  Procedure Laterality Date   APPENDECTOMY     CARDIAC CATHETERIZATION N/A 03/25/2015   Procedure: Left Heart Cath and Coronary Angiography;  Surgeon: Alm LELON Clay, MD;  Location: [facility] INVASIVE CV LAB;  Service: Cardiovascular;  Laterality: N/A;   CORONARY STENT INTERVENTION N/A 10/09/2018   Procedure: CORONARY STENT INTERVENTION;  Surgeon: Dann Candyce RAMAN, MD;  Location: [facility] INVASIVE CV LAB;  Service: Cardiovascular;  Laterality: N/A;   DILATATION & CURETTAGE/HYSTEROSCOPY WITH MYOSURE N/A 05/19/2016   Procedure: DILATATION & CURETTAGE/HYSTEROSCOPY WITH MYOSURE;  Surgeon: Hargis Paradise, MD;  Location: [facility];  Service: Gynecology;  Laterality: N/A;  Possible Myosure for polyps.   EYE SURGERY     LEFT HEART CATH AND CORONARY ANGIOGRAPHY N/A 10/09/2018   Procedure: LEFT HEART CATH AND CORONARY ANGIOGRAPHY;  Surgeon: Dann Candyce RAMAN, MD;  Location: [facility] INVASIVE CV LAB;  Service: Cardiovascular;  Laterality: N/A;   MOUTH SURGERY     URETHRAL DILATION     Patient Active Problem List   Diagnosis Date Noted   B12 deficiency 10/24/2023   Seasonal allergies 10/21/2023   Gastroesophageal reflux disease without esophagitis 10/21/2023   Epigastric pain 10/21/2023   Stress 02/02/2023   Insomnia 12/23/2022   Blood clotting disorder 10/28/2022   Neck pain 10/27/2022   Acute thoracic back pain 10/27/2022   Lumbar radiculopathy 05/26/2022   Degeneration of lumbar intervertebral disc 05/26/2022   Major depressive disorder    Severe alcohol use disorder    Generalized anxiety disorder    Obstructive sleep apnea    Hepatic steatosis 04/22/2022   Osteoarthritis of carpometacarpal (CMC) joint of thumb 04/20/2022   Acquired trigger  finger of right ring finger 04/20/2022   Bilateral wrist pain 04/20/2022   Chalazion of left lower eyelid 04/15/2022   Bipolar 1 disorder (HCC)    Cervical radiculopathy 10/14/2021   Pain in joint of right hip 07/03/2020   Rib pain 07/03/2020   Recurrent syncope    Orthostasis    Lactic acidosis 12/18/2019   Hematemesis 12/18/2019   Pain in right knee 12/14/2018   PAF (paroxysmal atrial fibrillation) 10/09/2018    Dorsalgia 09/22/2018   Dysphagia 09/22/2018   GERD without esophagitis 09/22/2018   Irritable bowel syndrome without diarrhea 09/22/2018   Osteoarthritis of knee 09/22/2018   Polypharmacy 09/22/2018   Sciatica of right side 09/22/2018   Vitamin D  deficiency 09/22/2018   Mixed hyperlipidemia 05/07/2016   Chronic atrial fibrillation (HCC) 09/08/2015   Coronary artery disease involving native coronary artery with angina pectoris 03/24/2015   Essential hypertension 03/03/2015   Chest pain 02/11/2015   Non-ST elevation MI (NSTEMI) (HCC) 07/26/2008    PCP: Raford Riggs, MD  REFERRING PROVIDER: Christobal Dickey LABOR, PA-C  REFERRING DIAG: [phone] (ICD-10-CM) - Pain in right knee  THERAPY DIAG:  No diagnosis found.  Rationale for Evaluation and Treatment: Rehabilitation  ONSET DATE: 3 years ago  SUBJECTIVE:   SUBJECTIVE STATEMENT: I'm hurting all over today. I spend about an hour each morning stretching out. Average pain level in the knees is 5/10. Patient is experiencing more episodes of R knee giving way when stepping or walking.   Pt reports ~3 year history of BIL knee pain, R>L. Does not endorse any specific injury, reports this was preceded by job with more time on feet. States symptoms slowly seem to be worsening, knee will occasionally buckle. Tends to fluctuate, oftentimes based on work activities. Has fluctuating swelling as well through lateral R knee. She also endorses history of neck/UE pain, cramping in legs and arms for which she states she is in communication w/ providers. Also mentions a R shoulder subluxation a couple months ago, states she will be getting an MRI soon. Also reports seeing counselor weekly for mental health hx.   PERTINENT HISTORY: BPD1, afib, CAD w/ chest pain, dysphagia, HTN, GAD, depression, migraine, NSTEMI, OSA, recurrent syncope  PAIN:  Are you having pain: 5/10 Location/description: R>L knee, posterior and anterior Best-worst over past week:  2-7/10  - aggravating factors: walking, standing ,stairs - Easing factors: pain ointment, pain medication  PRECAUTIONS: cardiac hx     WEIGHT BEARING RESTRICTIONS: No  FALLS:  Has patient fallen in last 6 months? No  LIVING ENVIRONMENT: Uses cane PRN for balance Lives w/ cats; lives in split level, 2 sets of stairs - BIL rails, 6-8 vs 8-10 steps on each  OCCUPATION: cashier  PLOF: Independent - enjoys gardening, art  PATIENT GOALS: build strength/muscle, get back to art  NEXT MD VISIT: TBD  OBJECTIVE:  Note: Objective measures were completed at Evaluation unless otherwise noted.  DIAGNOSTIC FINDINGS:  No recent imaging in chart - pt states she has had XR, endorses some degenerative changes   PATIENT SURVEYS:   09/20/23 LEFS 27 / 80 = 33.8 % 10/25/23 29/80 11/22/23 LEFS 31 / 80 = 38.8 %  COGNITION: Overall cognitive status: Within functional limits for tasks assessed     SENSATION: NT  EDEMA:  Pt endorses fluctuating edema in lateral R knee   LOWER EXTREMITY ROM:      Right eval Left eval  Hip flexion    Hip extension    Hip internal rotation  Hip external rotation    Knee extension A: full   A: full  Knee flexion A: 119 deg   A: 124 deg   (Blank rows = not tested) (Key: WFL = within functional limits not formally assessed, * = concordant pain, s = stiffness/stretching sensation, NT = not tested)  Comments:    10/27/23: R knee flex 130 deg    L knee flex 110 deg feels tight on top of knee  LOWER EXTREMITY MMT:    MMT Right eval Left eval  Hip flexion 4 4  Hip abduction (modified sitting) 5 5  Hip internal rotation    Hip external rotation    Knee flexion 4+ 5  Knee extension 4+ 5  Ankle dorsiflexion 4+ 5   (Blank rows = not tested) (Key: WFL = within functional limits not formally assessed, * = concordant pain, s = stiffness/stretching sensation, NT = not tested)  Comments:    10/27/23:  hip flex R 4/5, L 4+/5; R knee ext 5-/5, flex 4+/5,    11/22/23:  hip flex R 5/5, L 4+/5; R knee ext 5/5, L knee ext 5-/5, R flex 5/5, L knee flex 4+/5  LOWER EXTREMITY SPECIAL TESTS:  Deferred given time constraints  FUNCTIONAL TESTS:  5xSTS: 11.69sec w/ mild pain  TUG: 10.27sec w/o AD, antalgic gait  GAIT: Distance walked: within clinic Assistive device utilized: None Level of assistance: Complete Independence Comments: antalgic gait R>L, reduced gait speed/cadence                                                                                                                              TREATMENT DATE: [facility] Adult PT Treatment:                                                DATE: 06/28/24 Therapeutic Exercise: discharging Manual Therapy: *** Neuromuscular re-ed: *** Therapeutic Activity: *** Modalities: *** Self Care: ***  [facility] Adult PT Treatment:                                                DATE: 11/22/2023 Therapeutic Exercise: NuStep L6 x 7 min + subjective intake Prone knee flexion x 20 feels some in low back as well Straight leg bridges on orange PB 2x15 Manual Therapy: Supportive wrapping with ACE bandages brought in by patient for self use at work to prevent knee giving way Neuromuscular re-ed: Small range SLR + 1.5#AW 15x5 Therapeutic Activities: Goals assessed, LEFS completed, and POC discussed  [facility] Adult PT Treatment:  DATE: 11/17/2023 Therapeutic Exercise: NuStep L6 x 6 min + subjective intake Straight leg bridges on orange PB 2x15 Seated LAQ + 4#AW 15x5 Manual Therapy: Kinesiotape (L) knee for support (overlapping oval) Neuromuscular re-ed: Small range SLR + 1.5#AW 15x5 SAQ + 4#AW 15x5   [facility] Adult PT Treatment:                                                DATE: 11/15/2023 Therapeutic Exercise: NuStep L6 x 7.5 min PT present to discuss status LAQ + 4#AW x15 Seated hip flexion 4# x 15 Straight leg bridges on orange PB 2x15 Neuromuscular  re-ed: SLR + 1#AW 15x5 Therapeutic Activities: Worked on kneeling and standing from kneeling with and without use of a table (or object) for UE support onto and off of foam pad to improve ability to garden independently. Discussed use of KT tape for support of knee as well as Ace Bandages which pt has used in the past.    [facility] Adult PT Treatment:                                                DATE: 11/10/2023 Therapeutic Exercise: NuStep L6 x 6 min LAQ + 4#AW x15 Straight leg bridges on orange PB 2x15 Neuromuscular re-ed: SLR + 1#AW 15x5 Side Lying: Bent knee hip abd + GTB 15x3 Straight leg hip abd + GTB 15x3 Small leg circles in hip abd + GTB CW/CCW x5 each  PATIENT EDUCATION:  Education details: Updated HEP Person educated: Patient Education method: Explanation, Demonstration, Tactile cues, Verbal cues Education comprehension: verbalized understanding, returned demonstration, verbal cues required, tactile cues required, and needs further education    HOME EXERCISE PROGRAM: Access Code: [email or web address] URL: [email or web address] Date: 11/17/2023 Prepared by: Lamarr Price  Exercises - Supine Knee Extension Strengthening  - 1 x daily - 7 x weekly - 2 sets - 15 reps - Squat with Chair Touch  - 1 x daily - 3 x weekly - 1-2 sets - 10 reps - Seated Long Arc Quad  - 1 x daily - 7 x weekly - 22 sets - 15 reps - Supine Bridge  - 1 x daily - 7 x weekly - 1-3 sets - 10 reps - 10 sec hold - Seated Hip Abduction with Resistance  - 1 x daily - 3 x weekly - 2 sets - 10 reps - Small Range Straight Leg Raise  - 1 x daily - 7 x weekly - 3 sets - 10 reps - Wall Quarter Squat  - 1 x daily - 7 x weekly - 3 sets - 10 reps - 5 sec hold - Sidelying Bent Knee Lift at 45 Degrees  - 1 x daily - 7 x weekly - 3 sets - 10 reps - Clam with Resistance  - 1 x daily - 7 x weekly - 3 sets - 10 reps - Sidelying Hip Abduction with Resistance at Thighs  - 1 x daily - 7 x weekly - 3 sets - 10 reps -  Seated Knee Extension with Resistance  - 1 x daily - 7 x weekly - 3 sets - 10 reps - Prone Quadriceps Stretch with Strap  - 1-2 x daily - 7 x weekly -  1 sets - 3-5 reps - 30 sec hold   ASSESSMENT:  CLINICAL IMPRESSION: Madyson Lukach presents with ongoing knee pain. She reports that the right knee has improved with PT and she demonstrates improved strength in the R knee. The right knee is giving way intermittently with standing and walking despite improved strength. The left knee has worsened following a fall a few months ago. She reports improvement at the end of her PT sessions and agrees that if she were more compliant at home her knees would feel better. We discussed having a friend hold her accountable for performing her HEP. Her LEFS indicates that she has made no functional improvements. Due to her lack of progress and lack of compliance with her HEP, pt will be discharged at this time.     OBJECTIVE IMPAIRMENTS: Abnormal gait, decreased activity tolerance, decreased balance, decreased endurance, decreased mobility, difficulty walking, decreased ROM, decreased strength, and pain.   ACTIVITY LIMITATIONS: carrying, lifting, bending, standing, squatting, sleeping, stairs, transfers, and locomotion level  PARTICIPATION LIMITATIONS: meal prep, cleaning, laundry, community activity, and occupation  PERSONAL FACTORS: Age, Time since onset of injury/illness/exacerbation, and 3+ comorbidities: BPD1, afib, CAD w/ chest pain, dysphagia, HTN, GAD, depression, migraine, NSTEMI, OSA, recurrent syncope are also affecting patient's functional outcome.   REHAB POTENTIAL: Good  CLINICAL DECISION MAKING: Evolving/moderate complexity  EVALUATION COMPLEXITY: Moderate   GOALS:  SHORT TERM GOALS: Target date: 10/04/2023  Pt will demonstrate appropriate understanding and performance of initially prescribed HEP in order to facilitate improved independence with management of symptoms.  Baseline: HEP  Goal  status: MET  2. Pt will report at least 25% improvement in overall pain levels over past week in order to facilitate improved tolerance to typical daily activities.   Baseline: 2-7/10  Goal status: NOT MET   LONG TERM GOALS: Target date: 11/24/2023  Pt will improve MCID or greater on LEFS in order to demonstrate improved perception of function due to symptoms (MCID 9 pts) Baseline: LEFS 27/80 10/25/23: 29/80 = 36.3%, 11/22/23:  31 / 80 = 38.8 % Goal status: NOT MET  2.  Pt will demonstrate 5/5 MMT BIL knees/ankles for improved functional strength.  Baseline: See MMT chart above Goal status: PARTIALLY MET 11/22/23  3.  Pt will report no more than 4/10 knee pain on NPS with average work day in order to facilitate improved tolerance to work tasks.  Baseline: increased pain at end of work day, up to 7/10;  10/25/23: 8/10 Goal status: NOT MET  4.  Pt will be able to perform 5xSTS in less than or equal to 9sec in order to demonstrate reduced fall risk and improved functional independence (MCID 5xSTS = 2.3 sec). Baseline: 11sec; 10/25/23: 9 sec Goal status: MET  5. Pt will report at least 50% decrease in overall pain levels in past week in order to facilitate improved tolerance to basic ADLs/mobility.   Baseline: 2-7/10  Goal status: MET for R knee 65/70% 11/22/23; IN PROGRESS L KNEE -IN PROGRESS  6. Pt will demonstrate appropriate performance of final prescribed HEP in order to facilitate improved self-management of symptoms post-discharge.   Baseline: HEP TBD  Goal status: NOT MET   PLAN:  PT FREQUENCY: 1-2x/week  PT DURATION: 4 weeks  PLANNED INTERVENTIONS: [id number]- PT Re-evaluation, 97110-Therapeutic exercises, 97530- Therapeutic activity, 97112- Neuromuscular re-education, 97535- Self Care, [id number]- Manual therapy, [phone]- Gait training, Patient/Family education, Balance training, Stair training, Taping, Dry Needling, Joint mobilization, Cryotherapy, and Moist heat  PLAN FOR NEXT SESSION:  D/C TO HEP   Mliss Cummins, PT  06/28/2024 7:50 AM

## 2024-07-03 ENCOUNTER — Ambulatory Visit: Admitting: Physical Therapy

## 2024-07-03 ENCOUNTER — Ambulatory Visit (INDEPENDENT_AMBULATORY_CARE_PROVIDER_SITE_OTHER): Admitting: Licensed Clinical Social Worker

## 2024-07-03 ENCOUNTER — Encounter (HOSPITAL_COMMUNITY): Payer: Self-pay | Admitting: Licensed Clinical Social Worker

## 2024-07-03 NOTE — Progress Notes (Signed)
 Patient is a late cancel for session because sick

## 2024-07-09 ENCOUNTER — Ambulatory Visit

## 2024-07-09 DIAGNOSIS — G8929 Other chronic pain: Secondary | ICD-10-CM

## 2024-07-09 DIAGNOSIS — M6281 Muscle weakness (generalized): Secondary | ICD-10-CM | POA: Diagnosis not present

## 2024-07-09 DIAGNOSIS — R29898 Other symptoms and signs involving the musculoskeletal system: Secondary | ICD-10-CM

## 2024-07-09 DIAGNOSIS — R2689 Other abnormalities of gait and mobility: Secondary | ICD-10-CM

## 2024-07-09 DIAGNOSIS — R262 Difficulty in walking, not elsewhere classified: Secondary | ICD-10-CM

## 2024-07-09 NOTE — Therapy (Signed)
 OUTPATIENT PHYSICAL THERAPY TREATMENT   Patient Name: Lindsay [name] MRN: [id number] DOB:1955-10-26, 68 y.o., female Today's Date: 07/09/2024  END OF SESSION:  PT End of Session - 07/09/24 1407     Visit Number 2    Number of Visits 17    Date for Recertification  08/25/24    Authorization Type Medicare Part A and B    Progress Note Due on Visit 10    PT Start Time 1407   pt arrived late   PT Stop Time 1442    PT Time Calculation (min) 35 min    Activity Tolerance Patient tolerated treatment well    Behavior During Therapy [facility] for tasks assessed/performed          Past Medical History:  Diagnosis Date   Abnormal mammogram    Thick Tissue   Acquired trigger finger of right ring finger 04/20/2022   Arthritis    Bilateral wrist pain 04/20/2022   Bipolar 1 disorder    Cataract    OU   Cervical radiculopathy 10/14/2021   Chalazion of left lower eyelid 04/15/2022   Chest pain 02/11/2015   Chronic atrial fibrillation 09/08/2015   Colon polyp    Compressed cervical disc    Coronary artery disease involving native coronary artery with angina pectoris 03/24/2015   Dorsalgia 09/22/2018   Dysphagia 09/22/2018   Essential hypertension 03/03/2015   Generalized anxiety disorder    GERD without esophagitis 09/22/2018   Hematemesis 12/18/2019   Hepatic steatosis 04/22/2022   History of appendectomy 1963   Hypertensive retinopathy    OU   Irritable bowel syndrome without diarrhea 09/22/2018   Lactic acidosis 12/18/2019   LLQ pain 04/15/2022   Major depressive disorder    Migraine without status migrainosus, not intractable 09/22/2018   Mixed hyperlipidemia 05/07/2016   Non-ST elevation MI (NSTEMI) 07/26/2008   No cardiac catheterization at the time.   Obstructive sleep apnea    no CPAP use   Orthostasis    Osteoarthritis of carpometacarpal (CMC) joint of thumb 04/20/2022   Osteoarthritis of knee 09/22/2018   PAF (paroxysmal atrial fibrillation) 10/09/2018   Pain in joint  of right hip 07/03/2020   Pain in right knee 12/14/2018   Recurrent syncope    Rib pain 07/03/2020   Sciatica of right side 09/22/2018   Severe alcohol use disorder    Vitamin D  deficiency    Past Surgical History:  Procedure Laterality Date   APPENDECTOMY     CARDIAC CATHETERIZATION N/A 03/25/2015   Procedure: Left Heart Cath and Coronary Angiography;  Surgeon: Alm LELON Clay, MD;  Location: [facility] INVASIVE CV LAB;  Service: Cardiovascular;  Laterality: N/A;   CORONARY STENT INTERVENTION N/A 10/09/2018   Procedure: CORONARY STENT INTERVENTION;  Surgeon: Dann Candyce RAMAN, MD;  Location: [facility] INVASIVE CV LAB;  Service: Cardiovascular;  Laterality: N/A;   DILATATION & CURETTAGE/HYSTEROSCOPY WITH MYOSURE N/A 05/19/2016   Procedure: DILATATION & CURETTAGE/HYSTEROSCOPY WITH MYOSURE;  Surgeon: Hargis Paradise, MD;  Location: [facility];  Service: Gynecology;  Laterality: N/A;  Possible Myosure for polyps.   EYE SURGERY     LEFT HEART CATH AND CORONARY ANGIOGRAPHY N/A 10/09/2018   Procedure: LEFT HEART CATH AND CORONARY ANGIOGRAPHY;  Surgeon: Dann Candyce RAMAN, MD;  Location: [facility] INVASIVE CV LAB;  Service: Cardiovascular;  Laterality: N/A;   MOUTH SURGERY     URETHRAL DILATION     Patient Active Problem List   Diagnosis Date Noted   B12 deficiency 10/24/2023   Seasonal allergies 10/21/2023  Gastroesophageal reflux disease without esophagitis 10/21/2023   Epigastric pain 10/21/2023   Stress 02/02/2023   Insomnia 12/23/2022   Blood clotting disorder 10/28/2022   Neck pain 10/27/2022   Acute thoracic back pain 10/27/2022   Lumbar radiculopathy 05/26/2022   Degeneration of lumbar intervertebral disc 05/26/2022   Major depressive disorder    Severe alcohol use disorder    Generalized anxiety disorder    Obstructive sleep apnea    Hepatic steatosis 04/22/2022   Osteoarthritis of carpometacarpal (CMC) joint of thumb 04/20/2022   Acquired trigger finger of right ring finger 04/20/2022    Bilateral wrist pain 04/20/2022   Chalazion of left lower eyelid 04/15/2022   Bipolar 1 disorder (HCC)    Cervical radiculopathy 10/14/2021   Pain in joint of right hip 07/03/2020   Rib pain 07/03/2020   Recurrent syncope    Orthostasis    Lactic acidosis 12/18/2019   Hematemesis 12/18/2019   Pain in right knee 12/14/2018   PAF (paroxysmal atrial fibrillation) 10/09/2018   Dorsalgia 09/22/2018   Dysphagia 09/22/2018   GERD without esophagitis 09/22/2018   Irritable bowel syndrome without diarrhea 09/22/2018   Osteoarthritis of knee 09/22/2018   Polypharmacy 09/22/2018   Sciatica of right side 09/22/2018   Vitamin D  deficiency 09/22/2018   Mixed hyperlipidemia 05/07/2016   Chronic atrial fibrillation (HCC) 09/08/2015   Coronary artery disease involving native coronary artery with angina pectoris 03/24/2015   Essential hypertension 03/03/2015   Chest pain 02/11/2015   Non-ST elevation MI (NSTEMI) (HCC) 07/26/2008    PCP: Benton Gave, PA  REFERRING PROVIDER: Lauraine Hamlet, FNP  REFERRING DIAG: left shoulder and low back pain (patient on provider's notes confirm that it is the right shoulder)   THERAPY DIAG:  Muscle weakness (generalized)  Other symptoms and signs involving the musculoskeletal system  Other abnormalities of gait and mobility  Chronic pain of both knees  Difficulty in walking, not elsewhere classified  Rationale for Evaluation and Treatment: Rehabilitation  ONSET DATE: 2 years ago (shoulder) (Back): Fall several years ago  SUBJECTIVE:                                                                                                                                                                                      SUBJECTIVE STATEMENT: Patient reports Lt shoulder is worse than Rt but both are painful, states she noticed poor circulation in Lt hand yesterday which is new. Patient will receive a steroid shot in lumbar on Thursday. Patient states she  uses pain medication to help get through work shift and as needed with sleeping.   EVAL: Pt reports she hurts all over, stating the onset of  her right shoulder pain occurred 2 years ago from an accident. She does not want to undergo surgery, even though this has been recommended. Reports her back pain is due to falling several years ago. 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 She wants to improve her pain so she can continue to work her garden. No red flag symptoms.   Hand dominance: Right  PERTINENT HISTORY: See extensive PMH above   PAIN:  Are you having pain? Yes: NPRS scale: 9/10 worst, 3/10 currently  Pain location: multi-joint pain Pain description: sharp, dull, ache  Aggravating factors: standing for hours, stairs, reaching, walking Relieving factors: Pressure point therapy , hard ball, vibrating bed   PRECAUTIONS: Fall  RED FLAGS: None   WEIGHT BEARING RESTRICTIONS: No  FALLS:  Has patient fallen in last 6 months? Yes. Number of falls 2x cannot see very well and was in a hurry   LIVING ENVIRONMENT: Lives with: lives alone Lives in: House/apartment Stairs: Yes: Internal: 3 stories steps; can reach both Has following equipment at home: None  OCCUPATION: Employee at [employer]   PLOF: Independent  PATIENT GOALS: Capacity to maintain my garden and keep my house  NEXT MD VISIT: TBD  OBJECTIVE:  Note: Objective measures were completed at Evaluation unless otherwise noted.    PATIENT SURVEYS :  PSFS: THE PATIENT SPECIFIC FUNCTIONAL SCALE  Place score of 0-10 (0 = unable to perform activity and 10 = able to perform activity at the same level as before injury or problem)  Activity Date: 06/28/24    Able to kneel  0    2.  Walk into grocery store without LBP 0    3.  Reach for dish in cabinet  0         Total Score 0      Total Score = Sum of activity scores/number of activities  Minimally Detectable Change: 3 points  (for single activity); 2 points (for average score)  [facility] (nd). The Patient Specific Functional Scale . Retrieved from [email or web address]  Cannot kneel down  COGNITION: Overall cognitive status: Within functional limits for tasks assessed       POSTURE: Forward head posture  Lumbar ROM: Limited 75% extension pain Flexion WFL pain Left flexion 25% limited pain Right flexion 25% limited pain  UPPER EXTREMITY ROM:   Active ROM Right eval Left eval  Shoulder flexion 50 active 120 passive WFL   Shoulder extension [facility]  Shoulder abduction 55 deg active 128 passive 105 deg   Shoulder adduction    Shoulder internal rotation [facility]  Shoulder external rotation Limited 50% WFL  Elbow flexion    Elbow extension    Wrist flexion    Wrist extension    Wrist ulnar deviation    Wrist radial deviation    Wrist pronation    Wrist supination    (Blank rows = not tested)  UPPER EXTREMITY MMT:  MMT Right eval Left eval  Shoulder flexion 3-   Shoulder extension 3+   Shoulder abduction 3-   Shoulder adduction    Shoulder internal rotation 4-   Shoulder external rotation 3+   Middle trapezius    Lower trapezius    Elbow flexion    Elbow extension    Wrist flexion    Wrist extension    Wrist ulnar deviation    Wrist radial deviation    Wrist pronation    Wrist  supination    Grip strength (lbs)    (Blank rows = not tested) LOWER EXTREMITY MMT:    MMT Right eval Left eval  Hip flexion 4 4  Hip extension    Hip abduction    Hip adduction    Hip internal rotation    Hip external rotation    Knee flexion 5 4- pain   Knee extension 4 4  Ankle dorsiflexion    Ankle plantarflexion    Ankle inversion    Ankle eversion     (Blank rows = not tested)   Functional testing: Tandem stance: 8 sec before balance loss Single limb stance bilaterally: unable  Sit to stands 19.11 sec                                                                                             [facility] Adult PT Treatment:                                                DATE: 07/09/2024 Therapeutic Exercise: Self-massage with yellow spiky ball along Rt posterior shoulder complex Neuromuscular re-ed: Shoulder isometrics --> flexion, abduction, ER 8 x 5 sec (Rt) --> seated Seated shoulder scapula retraction Standing shoulder ER with arms bent by sides   TREATMENT DATE:  Eval only- no time to issue HEP   PATIENT EDUCATION: Education details: HEP issued Person educated: Patient Education method: Explanation, Demonstration, Tactile cues, and Verbal cues Education comprehension: verbalized understanding, returned demonstration, verbal cues required, tactile cues required, and needs further education  HOME EXERCISE PROGRAM: Access Code: [email or web address] URL: [email or web address] Date: 07/09/2024 Prepared by: Lamarr Price  Exercises - Standing Isometric Shoulder Flexion with Doorway - Arm Bent  - 1 x daily - 7 x weekly - 3 sets - 10 reps - 3-5 sec hold - Standing Isometric Shoulder Abduction with Doorway - Arm Bent  - 1 x daily - 7 x weekly - 3 sets - 10 reps - 3-5 sec hold - Standing Isometric Shoulder External Rotation with Doorway and Towel Roll  - 1 x daily - 7 x weekly - 3 sets - 10 reps - 3-5 sec hold - Seated Scapular Retraction  - 1 x daily - 7 x weekly - 3 sets - 10 reps - 3-5 sec hold - Shoulder External Rotation and Scapular Retraction  - 1 x daily - 7 x weekly - 3 sets - 10 reps - 3-5 sec hold  ASSESSMENT:  CLINICAL IMPRESSION: Shoulder isometrics introduced, providing alignment cues as needed to address postural tension and discomfort. Cueing needed to keep patient in pain-free range of motion with scapula retraction and shoulder external rotation. HEP issued with shoulder exercises.   EVAL: Patient is a 68 y.o. female who was seen today for physical therapy evaluation and treatment for  Right shoulder pain and low back pain, though patient endorses multi-joint pain that is severely limiting her overall functional capacity. Upon assessment pt severely limited right shoulder ROM, however passively close to [facility].  Pt unable to hold tandem stance longer than 5 sec, unable to complete single limb stance bilaterally. She also has lower extremity and upper extremity strength deficits. Pt loves working at her job, and wants to get better so she can work with out pain, upkeep her house, and continue treating her garden. Pt will benefit from skilled therapy to address the deficits below.    OBJECTIVE IMPAIRMENTS: Abnormal gait, cardiopulmonary status limiting activity, decreased activity tolerance, decreased balance, decreased cognition, decreased coordination, decreased endurance, decreased mobility, difficulty walking, decreased ROM, decreased strength, increased fascial restrictions, increased muscle spasms, impaired flexibility, impaired UE functional use, improper body mechanics, postural dysfunction, and pain.   ACTIVITY LIMITATIONS: carrying, lifting, bending, standing, squatting, sleeping, stairs, transfers, bed mobility, bathing, toileting, dressing, and reach over head  PARTICIPATION LIMITATIONS: meal prep, cleaning, laundry, driving, community activity, occupation, and yard work  PERSONAL FACTORS: Age, Behavior pattern, Past/current experiences, Time since onset of injury/illness/exacerbation, and 3+ comorbidities: Previous PMH with shoulder surgeries are also affecting patient's functional outcome.   REHAB POTENTIAL: Poor see personal factors above, chronicity of injuries.   CLINICAL DECISION MAKING: Unstable/unpredictable  EVALUATION COMPLEXITY: High  GOALS: Goals reviewed with patient? Yes  SHORT TERM GOALS: Target date: 07/27/2024  Pt will be efficient with initial HEP so she has increased independence for everyday activities.  Baseline: no time to issue  Goal status:  INITIAL  2.  Pt will improve PSFS score by at least 3 points to improve overall endurance to walk from car to grocery store and back with less than or =5/10 reports of pain  Baseline: 0 Goal status: INITIAL  3.  Pt will be able to reach for objects at chest height with right shoulder so she improves functional capacity for painting and reaching for items at grocery store Baseline: unable Goal status: INITIAL  4. Patient will complete 5 x STS in </= 15 seconds to improve functional strength.   Baseline: see above   Goal status: INITIAL    LONG TERM GOALS: Target date: 08/25/24  Pt will be efficient with advanced HEP so she has increased independence for everyday activities.  Baseline:  Goal status: INITIAL  2.  Pt will maintain tandem stance  longer than 10 sec without UE support so she has improved balance and is a decreased fall risk Baseline: Unable Goal status: INITIAL  3.  Pt will perform kneeling to the floor from standing using light UE support so she improves functional capacity for gardening and household chores Baseline: unable Goal status: INITIAL    PLAN: PT FREQUENCY: 1-2x/week  PT DURATION: 8 weeks  PLANNED INTERVENTIONS: [id number]- PT Re-evaluation, 97750- Physical Performance Testing, 97110-Therapeutic exercises, 97530- Therapeutic activity, [id number]- Neuromuscular re-education, 97535- Self Care, [id number]- Manual therapy, [id number]- Gait training, [phone]- Aquatic Therapy, [phone] (1-2 muscles), [id number] (3+ muscles)- Dry Needling, Patient/Family education, Balance training, Stair training, Cryotherapy, and Moist heat  PLAN FOR NEXT SESSION: Assess response with HEP; shoulder ISO, shoulder mobility; functional strengthening.     Lamarr Price, PTA 07/09/2024 2:46 PM

## 2024-07-10 DIAGNOSIS — M5416 Radiculopathy, lumbar region: Secondary | ICD-10-CM | POA: Diagnosis not present

## 2024-07-12 ENCOUNTER — Ambulatory Visit

## 2024-07-12 DIAGNOSIS — R2689 Other abnormalities of gait and mobility: Secondary | ICD-10-CM

## 2024-07-12 DIAGNOSIS — M6281 Muscle weakness (generalized): Secondary | ICD-10-CM | POA: Diagnosis not present

## 2024-07-12 DIAGNOSIS — R29898 Other symptoms and signs involving the musculoskeletal system: Secondary | ICD-10-CM

## 2024-07-12 NOTE — Therapy (Signed)
 OUTPATIENT PHYSICAL THERAPY TREATMENT   Patient Name: Lindsay [name] MRN: [id number] DOB:Jun 26, 1956, 68 y.o., female Today's Date: 07/12/2024  END OF SESSION:  PT End of Session - 07/12/24 1430     Visit Number 3    Number of Visits 17    Date for Recertification  08/25/24    Authorization Type Medicare Part A and B    Progress Note Due on Visit 10    PT Start Time 1430    PT Stop Time 1510    PT Time Calculation (min) 40 min    Activity Tolerance Patient tolerated treatment well    Behavior During Therapy [facility] for tasks assessed/performed          Past Medical History:  Diagnosis Date   Abnormal mammogram    Thick Tissue   Acquired trigger finger of right ring finger 04/20/2022   Arthritis    Bilateral wrist pain 04/20/2022   Bipolar 1 disorder    Cataract    OU   Cervical radiculopathy 10/14/2021   Chalazion of left lower eyelid 04/15/2022   Chest pain 02/11/2015   Chronic atrial fibrillation 09/08/2015   Colon polyp    Compressed cervical disc    Coronary artery disease involving native coronary artery with angina pectoris 03/24/2015   Dorsalgia 09/22/2018   Dysphagia 09/22/2018   Essential hypertension 03/03/2015   Generalized anxiety disorder    GERD without esophagitis 09/22/2018   Hematemesis 12/18/2019   Hepatic steatosis 04/22/2022   History of appendectomy 1963   Hypertensive retinopathy    OU   Irritable bowel syndrome without diarrhea 09/22/2018   Lactic acidosis 12/18/2019   LLQ pain 04/15/2022   Major depressive disorder    Migraine without status migrainosus, not intractable 09/22/2018   Mixed hyperlipidemia 05/07/2016   Non-ST elevation MI (NSTEMI) 07/26/2008   No cardiac catheterization at the time.   Obstructive sleep apnea    no CPAP use   Orthostasis    Osteoarthritis of carpometacarpal (CMC) joint of thumb 04/20/2022   Osteoarthritis of knee 09/22/2018   PAF (paroxysmal atrial fibrillation) 10/09/2018   Pain in joint of right hip  07/03/2020   Pain in right knee 12/14/2018   Recurrent syncope    Rib pain 07/03/2020   Sciatica of right side 09/22/2018   Severe alcohol use disorder    Vitamin D  deficiency    Past Surgical History:  Procedure Laterality Date   APPENDECTOMY     CARDIAC CATHETERIZATION N/A 03/25/2015   Procedure: Left Heart Cath and Coronary Angiography;  Surgeon: Alm LELON Clay, MD;  Location: [facility] INVASIVE CV LAB;  Service: Cardiovascular;  Laterality: N/A;   CORONARY STENT INTERVENTION N/A 10/09/2018   Procedure: CORONARY STENT INTERVENTION;  Surgeon: Dann Candyce RAMAN, MD;  Location: [facility] INVASIVE CV LAB;  Service: Cardiovascular;  Laterality: N/A;   DILATATION & CURETTAGE/HYSTEROSCOPY WITH MYOSURE N/A 05/19/2016   Procedure: DILATATION & CURETTAGE/HYSTEROSCOPY WITH MYOSURE;  Surgeon: Hargis Paradise, MD;  Location: [facility];  Service: Gynecology;  Laterality: N/A;  Possible Myosure for polyps.   EYE SURGERY     LEFT HEART CATH AND CORONARY ANGIOGRAPHY N/A 10/09/2018   Procedure: LEFT HEART CATH AND CORONARY ANGIOGRAPHY;  Surgeon: Dann Candyce RAMAN, MD;  Location: [facility] INVASIVE CV LAB;  Service: Cardiovascular;  Laterality: N/A;   MOUTH SURGERY     URETHRAL DILATION     Patient Active Problem List   Diagnosis Date Noted   B12 deficiency 10/24/2023   Seasonal allergies 10/21/2023   Gastroesophageal reflux  disease without esophagitis 10/21/2023   Epigastric pain 10/21/2023   Stress 02/02/2023   Insomnia 12/23/2022   Blood clotting disorder 10/28/2022   Neck pain 10/27/2022   Acute thoracic back pain 10/27/2022   Lumbar radiculopathy 05/26/2022   Degeneration of lumbar intervertebral disc 05/26/2022   Major depressive disorder    Severe alcohol use disorder    Generalized anxiety disorder    Obstructive sleep apnea    Hepatic steatosis 04/22/2022   Osteoarthritis of carpometacarpal (CMC) joint of thumb 04/20/2022   Acquired trigger finger of right ring finger 04/20/2022   Bilateral wrist pain  04/20/2022   Chalazion of left lower eyelid 04/15/2022   Bipolar 1 disorder (HCC)    Cervical radiculopathy 10/14/2021   Pain in joint of right hip 07/03/2020   Rib pain 07/03/2020   Recurrent syncope    Orthostasis    Lactic acidosis 12/18/2019   Hematemesis 12/18/2019   Pain in right knee 12/14/2018   PAF (paroxysmal atrial fibrillation) 10/09/2018   Dorsalgia 09/22/2018   Dysphagia 09/22/2018   GERD without esophagitis 09/22/2018   Irritable bowel syndrome without diarrhea 09/22/2018   Osteoarthritis of knee 09/22/2018   Polypharmacy 09/22/2018   Sciatica of right side 09/22/2018   Vitamin D  deficiency 09/22/2018   Mixed hyperlipidemia 05/07/2016   Chronic atrial fibrillation (HCC) 09/08/2015   Coronary artery disease involving native coronary artery with angina pectoris 03/24/2015   Essential hypertension 03/03/2015   Chest pain 02/11/2015   Non-ST elevation MI (NSTEMI) (HCC) 07/26/2008    PCP: Benton Gave, PA  REFERRING PROVIDER: Lauraine Hamlet, FNP  REFERRING DIAG: left shoulder and low back pain (patient on provider's notes confirm that it is the right shoulder)   THERAPY DIAG:  Muscle weakness (generalized)  Other symptoms and signs involving the musculoskeletal system  Other abnormalities of gait and mobility  Rationale for Evaluation and Treatment: Rehabilitation  ONSET DATE: 2 years ago (shoulder) (Back): Fall several years ago  SUBJECTIVE:                                                                                                                                                                                      SUBJECTIVE STATEMENT: Not very well. She had lumbar injections on Tuesday and feels that it has helped. Has not been completing HEP.   EVAL: Pt reports she hurts all over, stating the onset of her right shoulder pain occurred 2 years ago from an accident. She does not want to undergo surgery, even though this has been recommended.  Reports her back pain is due to falling several years ago. 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 She wants to improve her pain so she can continue to work her garden. No red flag symptoms.   Hand dominance: Right  PERTINENT HISTORY: See extensive PMH above   PAIN:  Are you having pain? Yes: NPRS scale: 2 currently  Pain location: multi-joint pain Pain description: sharp, dull, ache  Aggravating factors: standing for hours, stairs, reaching, walking Relieving factors: Pressure point therapy , hard ball, vibrating bed   PRECAUTIONS: Fall  RED FLAGS: None   WEIGHT BEARING RESTRICTIONS: No  FALLS:  Has patient fallen in last 6 months? Yes. Number of falls 2x cannot see very well and was in a hurry   LIVING ENVIRONMENT: Lives with: lives alone Lives in: House/apartment Stairs: Yes: Internal: 3 stories steps; can reach both Has following equipment at home: None  OCCUPATION: Employee at [employer]   PLOF: Independent  PATIENT GOALS: Capacity to maintain my garden and keep my house  NEXT MD VISIT: TBD  OBJECTIVE:  Note: Objective measures were completed at Evaluation unless otherwise noted.    PATIENT SURVEYS :  PSFS: THE PATIENT SPECIFIC FUNCTIONAL SCALE  Place score of 0-10 (0 = unable to perform activity and 10 = able to perform activity at the same level as before injury or problem)  Activity Date: 06/28/24    Able to kneel  0    2.  Walk into grocery store without LBP 0    3.  Reach for dish in cabinet  0         Total Score 0      Total Score = Sum of activity scores/number of activities  Minimally Detectable Change: 3 points (for single activity); 2 points (for average score)  [facility] (nd). The Patient Specific Functional Scale . Retrieved from [email or web address]  Cannot kneel down  COGNITION: Overall cognitive status: Within functional limits  for tasks assessed       POSTURE: Forward head posture  Lumbar ROM: Limited 75% extension pain Flexion WFL pain Left flexion 25% limited pain Right flexion 25% limited pain  UPPER EXTREMITY ROM:   Active ROM Right eval Left eval  Shoulder flexion 50 active 120 passive WFL   Shoulder extension [facility]  Shoulder abduction 55 deg active 128 passive 105 deg   Shoulder adduction    Shoulder internal rotation [facility] WFL  Shoulder external rotation Limited 50% WFL  Elbow flexion    Elbow extension    Wrist flexion    Wrist extension    Wrist ulnar deviation    Wrist radial deviation    Wrist pronation    Wrist supination    (Blank rows = not tested)  UPPER EXTREMITY MMT:  MMT Right eval Left eval  Shoulder flexion 3-   Shoulder extension 3+   Shoulder abduction 3-   Shoulder adduction    Shoulder internal rotation 4-   Shoulder external rotation 3+   Middle trapezius    Lower trapezius    Elbow flexion    Elbow extension    Wrist flexion    Wrist extension    Wrist ulnar deviation    Wrist radial deviation    Wrist pronation    Wrist supination    Grip strength (lbs)    (Blank rows = not tested) LOWER EXTREMITY MMT:    MMT Right eval Left eval  Hip flexion 4 4  Hip extension    Hip abduction    Hip adduction    Hip  internal rotation    Hip external rotation    Knee flexion 5 4- pain   Knee extension 4 4  Ankle dorsiflexion    Ankle plantarflexion    Ankle inversion    Ankle eversion     (Blank rows = not tested)   Functional testing: Tandem stance: 8 sec before balance loss Single limb stance bilaterally: unable  Sit to stands 19.11 sec    [facility] Adult PT Treatment:                                                DATE: 07/12/24 Therapeutic Exercise: HEP review and update- educating patient on importance of adhering to prescribed program Heel slides x 10; 5 sec; LLE  SLR 2 x 10  Hip adduction isometric ball squeeze 2 x 10; 5 sec hold  LAQ 2  x 10   Self Care: Discussed community gym options providing contact information                                                                             [facility] Adult PT Treatment:                                                DATE: 07/09/2024 Therapeutic Exercise: Self-massage with yellow spiky ball along Rt posterior shoulder complex Neuromuscular re-ed: Shoulder isometrics --> flexion, abduction, ER 8 x 5 sec (Rt) --> seated Seated shoulder scapula retraction Standing shoulder ER with arms bent by sides  PATIENT EDUCATION: Education details: HEP update Person educated: Patient Education method: Explanation, Demonstration, Tactile cues, and Verbal cues, handout  Education comprehension: verbalized understanding, returned demonstration, verbal cues required, tactile cues required, and needs further education  HOME EXERCISE PROGRAM: Access Code: [email or web address] URL: [email or web address] Date: 07/12/2024 Prepared by: Lucie Meeter  Exercises - Standing Isometric Shoulder Flexion with Doorway - Arm Bent  - 1 x daily - 7 x weekly - 3 sets - 10 reps - 3-5 sec hold - Standing Isometric Shoulder Abduction with Doorway - Arm Bent  - 1 x daily - 7 x weekly - 3 sets - 10 reps - 3-5 sec hold - Standing Isometric Shoulder External Rotation with Doorway and Towel Roll  - 1 x daily - 7 x weekly - 3 sets - 10 reps - 3-5 sec hold - Seated Scapular Retraction  - 1 x daily - 7 x weekly - 3 sets - 10 reps - 3-5 sec hold - Shoulder External Rotation and Scapular Retraction  - 1 x daily - 7 x weekly - 3 sets - 10 reps - 3-5 sec hold - Supine Active Straight Leg Raise  - 1 x daily - 7 x weekly - 2 sets - 10 reps - Supine Hip Adduction Isometric with Ball  - 1 x daily - 7 x weekly - 2 sets - 10 reps - Seated Long Arc Quad  - 1 x daily - 7 x weekly -  2 sets - 10 reps  ASSESSMENT:  CLINICAL IMPRESSION: Patient admits to not completing HEP. We discussed importance of adhering to program to  assist in pain reduction and assist in functional improvement with patient verbalizing understanding. Focused on lower extremity ROM and strengthening today with fairly good tolerance, though patient quickly fatigues with majority of strengthening. We also discussed community gyms that are available with patient very interested and having plans to research on her own. Patient stated she felt better at conclusion of session.   EVAL: Patient is a 68 y.o. female who was seen today for physical therapy evaluation and treatment for Right shoulder pain and low back pain, though patient endorses multi-joint pain that is severely limiting her overall functional capacity. Upon assessment pt severely limited right shoulder ROM, however passively close to [facility]. Pt unable to hold tandem stance longer than 5 sec, unable to complete single limb stance bilaterally. She also has lower extremity and upper extremity strength deficits. Pt loves working at her job, and wants to get better so she can work with out pain, upkeep her house, and continue treating her garden. Pt will benefit from skilled therapy to address the deficits below.    OBJECTIVE IMPAIRMENTS: Abnormal gait, cardiopulmonary status limiting activity, decreased activity tolerance, decreased balance, decreased cognition, decreased coordination, decreased endurance, decreased mobility, difficulty walking, decreased ROM, decreased strength, increased fascial restrictions, increased muscle spasms, impaired flexibility, impaired UE functional use, improper body mechanics, postural dysfunction, and pain.   ACTIVITY LIMITATIONS: carrying, lifting, bending, standing, squatting, sleeping, stairs, transfers, bed mobility, bathing, toileting, dressing, and reach over head  PARTICIPATION LIMITATIONS: meal prep, cleaning, laundry, driving, community activity, occupation, and yard work  PERSONAL FACTORS: Age, Behavior pattern, Past/current experiences, Time since onset of  injury/illness/exacerbation, and 3+ comorbidities: Previous PMH with shoulder surgeries are also affecting patient's functional outcome.   REHAB POTENTIAL: Poor see personal factors above, chronicity of injuries.   CLINICAL DECISION MAKING: Unstable/unpredictable  EVALUATION COMPLEXITY: High  GOALS: Goals reviewed with patient? Yes  SHORT TERM GOALS: Target date: 07/27/2024  Pt will be efficient with initial HEP so she has increased independence for everyday activities.  Baseline: no time to issue  Goal status: INITIAL  2.  Pt will improve PSFS score by at least 3 points to improve overall endurance to walk from car to grocery store and back with less than or =5/10 reports of pain  Baseline: 0 Goal status: INITIAL  3.  Pt will be able to reach for objects at chest height with right shoulder so she improves functional capacity for painting and reaching for items at grocery store Baseline: unable Goal status: INITIAL  4. Patient will complete 5 x STS in </= 15 seconds to improve functional strength.   Baseline: see above   Goal status: INITIAL    LONG TERM GOALS: Target date: 08/25/24  Pt will be efficient with advanced HEP so she has increased independence for everyday activities.  Baseline:  Goal status: INITIAL  2.  Pt will maintain tandem stance  longer than 10 sec without UE support so she has improved balance and is a decreased fall risk Baseline: Unable Goal status: INITIAL  3.  Pt will perform kneeling to the floor from standing using light UE support so she improves functional capacity for gardening and household chores Baseline: unable Goal status: INITIAL    PLAN: PT FREQUENCY: 1-2x/week  PT DURATION: 8 weeks  PLANNED INTERVENTIONS: 97164- PT Re-evaluation, 97750- Physical Performance Testing, 97110-Therapeutic exercises, 97530- Therapeutic  activity, [id number]- Neuromuscular re-education, [phone]- Self Care, [id number]- Manual therapy, [id number]- Gait training, [phone]-  Aquatic Therapy, [phone] (1-2 muscles), [id number] (3+ muscles)- Dry Needling, Patient/Family education, Balance training, Stair training, Cryotherapy, and Moist heat  PLAN FOR NEXT SESSION: Assess response with HEP; shoulder ISO, shoulder mobility; general strengthening.   Delmo Matty, PT, DPT, ATC 07/12/2024 3:16 PM

## 2024-07-17 ENCOUNTER — Ambulatory Visit (HOSPITAL_COMMUNITY): Admitting: Licensed Clinical Social Worker

## 2024-07-17 ENCOUNTER — Telehealth: Payer: Self-pay | Admitting: Cardiovascular Disease

## 2024-07-17 ENCOUNTER — Other Ambulatory Visit: Payer: Self-pay | Admitting: Internal Medicine

## 2024-07-17 ENCOUNTER — Encounter: Payer: Self-pay | Admitting: Rehabilitative and Restorative Service Providers"

## 2024-07-17 ENCOUNTER — Ambulatory Visit

## 2024-07-17 DIAGNOSIS — M6281 Muscle weakness (generalized): Secondary | ICD-10-CM

## 2024-07-17 DIAGNOSIS — I25119 Atherosclerotic heart disease of native coronary artery with unspecified angina pectoris: Secondary | ICD-10-CM

## 2024-07-17 DIAGNOSIS — G8929 Other chronic pain: Secondary | ICD-10-CM

## 2024-07-17 DIAGNOSIS — E785 Hyperlipidemia, unspecified: Secondary | ICD-10-CM

## 2024-07-17 DIAGNOSIS — R2689 Other abnormalities of gait and mobility: Secondary | ICD-10-CM

## 2024-07-17 DIAGNOSIS — R29898 Other symptoms and signs involving the musculoskeletal system: Secondary | ICD-10-CM

## 2024-07-17 NOTE — Therapy (Signed)
 " OUTPATIENT PHYSICAL THERAPY TREATMENT   Patient Name: Lindsay [name] MRN: [id number] DOB:April 20, 1956, 68 y.o., female Today's Date: 07/17/2024  END OF SESSION:  PT End of Session - 07/17/24 1453     Visit Number 4    Number of Visits 17    Date for Recertification  08/25/24    Authorization Type Medicare Part A and B    Progress Note Due on Visit 10    PT Start Time 1452    PT Stop Time 1532    PT Time Calculation (min) 40 min    Activity Tolerance Patient tolerated treatment well    Behavior During Therapy [facility] for tasks assessed/performed           Past Medical History:  Diagnosis Date   Abnormal mammogram    Thick Tissue   Acquired trigger finger of right ring finger 04/20/2022   Arthritis    Bilateral wrist pain 04/20/2022   Bipolar 1 disorder    Cataract    OU   Cervical radiculopathy 10/14/2021   Chalazion of left lower eyelid 04/15/2022   Chest pain 02/11/2015   Chronic atrial fibrillation 09/08/2015   Colon polyp    Compressed cervical disc    Coronary artery disease involving native coronary artery with angina pectoris 03/24/2015   Dorsalgia 09/22/2018   Dysphagia 09/22/2018   Essential hypertension 03/03/2015   Generalized anxiety disorder    GERD without esophagitis 09/22/2018   Hematemesis 12/18/2019   Hepatic steatosis 04/22/2022   History of appendectomy 1963   Hypertensive retinopathy    OU   Irritable bowel syndrome without diarrhea 09/22/2018   Lactic acidosis 12/18/2019   LLQ pain 04/15/2022   Major depressive disorder    Migraine without status migrainosus, not intractable 09/22/2018   Mixed hyperlipidemia 05/07/2016   Non-ST elevation MI (NSTEMI) 07/26/2008   No cardiac catheterization at the time.   Obstructive sleep apnea    no CPAP use   Orthostasis    Osteoarthritis of carpometacarpal (CMC) joint of thumb 04/20/2022   Osteoarthritis of knee 09/22/2018   PAF (paroxysmal atrial fibrillation) 10/09/2018   Pain in joint of right hip  07/03/2020   Pain in right knee 12/14/2018   Recurrent syncope    Rib pain 07/03/2020   Sciatica of right side 09/22/2018   Severe alcohol use disorder    Vitamin D  deficiency    Past Surgical History:  Procedure Laterality Date   APPENDECTOMY     CARDIAC CATHETERIZATION N/A 03/25/2015   Procedure: Left Heart Cath and Coronary Angiography;  Surgeon: Alm LELON Clay, MD;  Location[facility] INVASIVE CV LAB;  Service: Cardiovascular;  Laterality: N/A;   CORONARY STENT INTERVENTION N/A 10/09/2018   Procedure: CORONARY STENT INTERVENTION;  Surgeon: Dann Candyce RAMAN, MD;  Location: [facility] INVASIVE CV LAB;  Service: Cardiovascular;  Laterality: N/A;   DILATATION & CURETTAGE/HYSTEROSCOPY WITH MYOSURE N/A 05/19/2016   Procedure: DILATATION & CURETTAGE/HYSTEROSCOPY WITH MYOSURE;  Surgeon: Hargis Paradise, MD;  Location: [facility] ORS;  Service: Gynecology;  Laterality: N/A;  Possible Myosure for polyps.   EYE SURGERY     LEFT HEART CATH AND CORONARY ANGIOGRAPHY N/A 10/09/2018   Procedure: LEFT HEART CATH AND CORONARY ANGIOGRAPHY;  Surgeon: Dann Candyce RAMAN, MD;  Location: [facility] INVASIVE CV LAB;  Service: Cardiovascular;  Laterality: N/A;   MOUTH SURGERY     URETHRAL DILATION     Patient Active Problem List   Diagnosis Date Noted   B12 deficiency 10/24/2023   Seasonal allergies 10/21/2023  Gastroesophageal reflux disease without esophagitis 10/21/2023   Epigastric pain 10/21/2023   Stress 02/02/2023   Insomnia 12/23/2022   Blood clotting disorder 10/28/2022   Neck pain 10/27/2022   Acute thoracic back pain 10/27/2022   Lumbar radiculopathy 05/26/2022   Degeneration of lumbar intervertebral disc 05/26/2022   Major depressive disorder    Severe alcohol use disorder    Generalized anxiety disorder    Obstructive sleep apnea    Hepatic steatosis 04/22/2022   Osteoarthritis of carpometacarpal (CMC) joint of thumb 04/20/2022   Acquired trigger finger of right ring finger 04/20/2022   Bilateral wrist pain  04/20/2022   Chalazion of left lower eyelid 04/15/2022   Bipolar 1 disorder (HCC)    Cervical radiculopathy 10/14/2021   Pain in joint of right hip 07/03/2020   Rib pain 07/03/2020   Recurrent syncope    Orthostasis    Lactic acidosis 12/18/2019   Hematemesis 12/18/2019   Pain in right knee 12/14/2018   PAF (paroxysmal atrial fibrillation) 10/09/2018   Dorsalgia 09/22/2018   Dysphagia 09/22/2018   GERD without esophagitis 09/22/2018   Irritable bowel syndrome without diarrhea 09/22/2018   Osteoarthritis of knee 09/22/2018   Polypharmacy 09/22/2018   Sciatica of right side 09/22/2018   Vitamin D  deficiency 09/22/2018   Mixed hyperlipidemia 05/07/2016   Chronic atrial fibrillation (HCC) 09/08/2015   Coronary artery disease involving native coronary artery with angina pectoris 03/24/2015   Essential hypertension 03/03/2015   Chest pain 02/11/2015   Non-ST elevation MI (NSTEMI) (HCC) 07/26/2008    PCP: Benton Gave, PA  REFERRING PROVIDER: Lauraine Hamlet, FNP  REFERRING DIAG: left shoulder and low back pain (patient on provider's notes confirm that it is the right shoulder)   THERAPY DIAG:  Muscle weakness (generalized)  Other symptoms and signs involving the musculoskeletal system  Other abnormalities of gait and mobility  Chronic pain of both knees  Rationale for Evaluation and Treatment: Rehabilitation  ONSET DATE: 2 years ago (shoulder) (Back): Fall several years ago  SUBJECTIVE:                                                                                                                                                                                      SUBJECTIVE STATEMENT: She notes she has been better. She reports that she tolerated working 7 hours the other day and could walk well at end of shift. She does note L 5th digit is staying numb all the time. She has h/o intermittent numbness and tingling in the hands. She had an episode of fingers going white  when driving home from work.  She notes she is considering joining the [employer] in  [place].   EVAL: Pt reports she hurts all over, stating the onset of her right shoulder pain occurred 2 years ago from an accident. She does not want to undergo surgery, even though this has been recommended. Reports her back pain is due to falling several years ago. 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 She wants to improve her pain so she can continue to work her garden. No red flag symptoms.   Hand dominance: Right  PERTINENT HISTORY: See extensive PMH above   PAIN:  Are you having pain? Yes: NPRS scale: moderate to low pain level today but I have a HA.  Pain location: multi-joint pain-- R shoulder, headache Pain description: sharp, dull, ache  Aggravating factors: standing for hours, stairs, reaching, walking Relieving factors: Pressure point therapy , hard ball, vibrating bed   PRECAUTIONS: Fall  WEIGHT BEARING RESTRICTIONS: No  FALLS:  Has patient fallen in last 6 months? Yes. Number of falls 2x cannot see very well and was in a hurry   LIVING ENVIRONMENT: Lives with: lives alone Lives in: House/apartment Stairs: Yes: Internal: 3 stories steps; can reach both Has following equipment at home: None  OCCUPATION: Employee at [employer]   PLOF: Independent  PATIENT GOALS: Capacity to maintain my garden and keep my house  NEXT MD VISIT: TBD  OBJECTIVE:  Note: Objective measures were completed at Evaluation unless otherwise noted. PATIENT SURVEYS :  PSFS: THE PATIENT SPECIFIC FUNCTIONAL SCALE  Place score of 0-10 (0 = unable to perform activity and 10 = able to perform activity at the same level as before injury or problem)  Activity Date: 06/28/24    Able to kneel  0    2.  Walk into grocery store without LBP 0    3.  Reach for dish in cabinet  0         Total Score 0      Total Score = Sum of activity scores/number of  activities  Minimally Detectable Change: 3 points (for single activity); 2 points (for average score)  [facility] (nd). The Patient Specific Functional Scale . Retrieved from [email or web address]  Cannot kneel down  COGNITION: Overall cognitive status: Within functional limits for tasks assessed     POSTURE: Forward head posture  Lumbar ROM: Limited 75% extension pain Flexion WFL pain Left flexion 25% limited pain Right flexion 25% limited pain  UPPER EXTREMITY ROM:  Active ROM Right eval Left eval Right 07/17/24  Shoulder flexion 50 active 120 passive WFL  162 degrees AROM  Shoulder extension [facility]   Shoulder abduction 55 deg active 128 passive 105 deg    Shoulder adduction     Shoulder internal rotation [facility] WFL   Shoulder external rotation Limited 50% WFL   Elbow flexion     Elbow extension     Wrist flexion     Wrist extension     Wrist ulnar deviation     Wrist radial deviation     Wrist pronation     Wrist supination     (Blank rows = not tested)  UPPER EXTREMITY MMT: MMT Right eval Left eval  Shoulder flexion 3-   Shoulder extension 3+   Shoulder abduction 3-   Shoulder adduction    Shoulder internal rotation 4-   Shoulder external rotation 3+   Middle trapezius    Lower trapezius    Elbow flexion    Elbow extension  Wrist flexion    Wrist extension    Wrist ulnar deviation    Wrist radial deviation    Wrist pronation    Wrist supination    Grip strength (lbs)    (Blank rows = not tested)  LOWER EXTREMITY MMT:   MMT Right eval Left eval  Hip flexion 4 4  Hip extension    Hip abduction    Hip adduction    Hip internal rotation    Hip external rotation    Knee flexion 5 4- pain   Knee extension 4 4  Ankle dorsiflexion    Ankle plantarflexion    Ankle inversion    Ankle eversion     (Blank rows = not tested)   Functional testing: Tandem stance: 8 sec before  balance loss Single limb stance bilaterally: unable  Sit to stands 19.11 sec   [facility] Adult PT Treatment:                                                DATE: 07/17/24 Therapeutic Exercise: Heel slides x 10 x 2 sets on R and L sides SLR x 10 reps x 2 sets  Standing AAROM shoulder flexion on physioball/countertop, then wall-- added to HEP Therapeutic Activity: Sidelying open book  Seated scapular retraction Seated L x 10 reps Self Care: Discussed regular HEP and AAROM for shoulder function Discussed gym considerations and need for regular, gentle movement  [facility] Adult PT Treatment:                                                DATE: 07/12/24 Therapeutic Exercise: HEP review and update- educating patient on importance of adhering to prescribed program Heel slides x 10; 5 sec; LLE  SLR 2 x 10  Hip adduction isometric ball squeeze 2 x 10; 5 sec hold  LAQ 2 x 10   Self Care: Discussed community gym options providing contact information                                                                             [facility] Adult PT Treatment:                                                DATE: 07/09/2024 Therapeutic Exercise: Self-massage with yellow spiky ball along Rt posterior shoulder complex Neuromuscular re-ed: Shoulder isometrics --> flexion, abduction, ER 8 x 5 sec (Rt) --> seated Seated shoulder scapula retraction Standing shoulder ER with arms bent by sides  PATIENT EDUCATION: Education details: HEP update Person educated: Patient Education method: Explanation, Demonstration, Tactile cues, and Verbal cues, handout  Education comprehension: verbalized understanding, returned demonstration, verbal cues required, tactile cues required, and needs further education  HOME EXERCISE PROGRAM: Access Code: [email or web address] URL: [email or web address] Date: 07/12/2024 Prepared by: Lucie Meeter  Exercises -  Standing Isometric Shoulder Flexion with Doorway - Arm Bent  - 1 x  daily - 7 x weekly - 3 sets - 10 reps - 3-5 sec hold - Standing Isometric Shoulder Abduction with Doorway - Arm Bent  - 1 x daily - 7 x weekly - 3 sets - 10 reps - 3-5 sec hold - Standing Isometric Shoulder External Rotation with Doorway and Towel Roll  - 1 x daily - 7 x weekly - 3 sets - 10 reps - 3-5 sec hold - Seated Scapular Retraction  - 1 x daily - 7 x weekly - 3 sets - 10 reps - 3-5 sec hold - Shoulder External Rotation and Scapular Retraction  - 1 x daily - 7 x weekly - 3 sets - 10 reps - 3-5 sec hold - Supine Active Straight Leg Raise  - 1 x daily - 7 x weekly - 2 sets - 10 reps - Supine Hip Adduction Isometric with Ball  - 1 x daily - 7 x weekly - 2 sets - 10 reps - Seated Long Arc Quad  - 1 x daily - 7 x weekly - 2 sets - 10 reps  ASSESSMENT:  CLINICAL IMPRESSION: Patient is considering gym membership. She has been doing HEP and feels progress with ability to work a longer shift. Patient improved AROM of R shoulder after AAROM today-- working towards STGs/LTGs. PT to continue to progress to patient tolerance.  EVAL: Patient is a 68 y.o. female who was seen today for physical therapy evaluation and treatment for Right shoulder pain and low back pain, though patient endorses multi-joint pain that is severely limiting her overall functional capacity. Upon assessment pt severely limited right shoulder ROM, however passively close to [facility]. Pt unable to hold tandem stance longer than 5 sec, unable to complete single limb stance bilaterally. She also has lower extremity and upper extremity strength deficits. Pt loves working at her job, and wants to get better so she can work with out pain, upkeep her house, and continue treating her garden. Pt will benefit from skilled therapy to address the deficits below.    OBJECTIVE IMPAIRMENTS: Abnormal gait, cardiopulmonary status limiting activity, decreased activity tolerance, decreased balance, decreased cognition, decreased coordination, decreased  endurance, decreased mobility, difficulty walking, decreased ROM, decreased strength, increased fascial restrictions, increased muscle spasms, impaired flexibility, impaired UE functional use, improper body mechanics, postural dysfunction, and pain.   GOALS: Goals reviewed with patient? Yes  SHORT TERM GOALS: Target date: 07/27/2024  Pt will be efficient with initial HEP so she has increased independence for everyday activities.  Baseline: no time to issue  Goal status: INITIAL  2.  Pt will improve PSFS score by at least 3 points to improve overall endurance to walk from car to grocery store and back with less than or =5/10 reports of pain  Baseline: 0 Goal status: INITIAL  3.  Pt will be able to reach for objects at chest height with right shoulder so she improves functional capacity for painting and reaching for items at grocery store Baseline: unable Goal status: INITIAL  4. Patient will complete 5 x STS in </= 15 seconds to improve functional strength.   Baseline: see above   Goal status: INITIAL    LONG TERM GOALS: Target date: 08/25/24  Pt will be efficient with advanced HEP so she has increased independence for everyday activities.  Baseline:  Goal status: INITIAL  2.  Pt will maintain tandem stance  longer than 10 sec without UE  support so she has improved balance and is a decreased fall risk Baseline: Unable Goal status: INITIAL  3.  Pt will perform kneeling to the floor from standing using light UE support so she improves functional capacity for gardening and household chores Baseline: unable Goal status: INITIAL    PLAN: PT FREQUENCY: 1-2x/week  PT DURATION: 8 weeks  PLANNED INTERVENTIONS: 97164- PT Re-evaluation, 97750- Physical Performance Testing, 97110-Therapeutic exercises, 97530- Therapeutic activity, [id number]- Neuromuscular re-education, 97535- Self Care, [id number]- Manual therapy, [id number]- Gait training, [phone]- Aquatic Therapy, [phone] (1-2 muscles), [id number] (3+  muscles)- Dry Needling, Patient/Family education, Balance training, Stair training, Cryotherapy, and Moist heat  PLAN FOR NEXT SESSION: Assess response with HEP; shoulder ISO, shoulder mobility; general strengthening.   Samanthia Howland, PT 07/17/2024 2:54 PM  "

## 2024-07-18 ENCOUNTER — Ambulatory Visit (HOSPITAL_COMMUNITY): Admitting: Licensed Clinical Social Worker

## 2024-07-18 DIAGNOSIS — F411 Generalized anxiety disorder: Secondary | ICD-10-CM

## 2024-07-18 DIAGNOSIS — F0630 Mood disorder due to known physiological condition, unspecified: Secondary | ICD-10-CM

## 2024-07-18 DIAGNOSIS — F1090 Alcohol use, unspecified, uncomplicated: Secondary | ICD-10-CM | POA: Diagnosis not present
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
## 2024-07-20 ENCOUNTER — Ambulatory Visit

## 2024-07-20 DIAGNOSIS — M6281 Muscle weakness (generalized): Secondary | ICD-10-CM | POA: Diagnosis not present

## 2024-07-20 DIAGNOSIS — R2689 Other abnormalities of gait and mobility: Secondary | ICD-10-CM

## 2024-07-20 DIAGNOSIS — R29898 Other symptoms and signs involving the musculoskeletal system: Secondary | ICD-10-CM

## 2024-07-20 NOTE — Therapy (Signed)
 " OUTPATIENT PHYSICAL THERAPY TREATMENT   Patient Name: Lindsay [name] MRN: [id number] DOB:07-Feb-1956, 68 y.o., female Today's Date: 07/20/2024  END OF SESSION:  PT End of Session - 07/20/24 0936     Visit Number 5    Number of Visits 17    Date for Recertification  08/25/24    Authorization Type Medicare Part A and B    Progress Note Due on Visit 10    PT Start Time 0935    PT Stop Time 1013    PT Time Calculation (min) 38 min    Activity Tolerance Patient tolerated treatment well    Behavior During Therapy [facility] for tasks assessed/performed            Past Medical History:  Diagnosis Date   Abnormal mammogram    Thick Tissue   Acquired trigger finger of right ring finger 04/20/2022   Arthritis    Bilateral wrist pain 04/20/2022   Bipolar 1 disorder    Cataract    OU   Cervical radiculopathy 10/14/2021   Chalazion of left lower eyelid 04/15/2022   Chest pain 02/11/2015   Chronic atrial fibrillation 09/08/2015   Colon polyp    Compressed cervical disc    Coronary artery disease involving native coronary artery with angina pectoris 03/24/2015   Dorsalgia 09/22/2018   Dysphagia 09/22/2018   Essential hypertension 03/03/2015   Generalized anxiety disorder    GERD without esophagitis 09/22/2018   Hematemesis 12/18/2019   Hepatic steatosis 04/22/2022   History of appendectomy 1963   Hypertensive retinopathy    OU   Irritable bowel syndrome without diarrhea 09/22/2018   Lactic acidosis 12/18/2019   LLQ pain 04/15/2022   Major depressive disorder    Migraine without status migrainosus, not intractable 09/22/2018   Mixed hyperlipidemia 05/07/2016   Non-ST elevation MI (NSTEMI) 07/26/2008   No cardiac catheterization at the time.   Obstructive sleep apnea    no CPAP use   Orthostasis    Osteoarthritis of carpometacarpal (CMC) joint of thumb 04/20/2022   Osteoarthritis of knee 09/22/2018   PAF (paroxysmal atrial fibrillation) 10/09/2018   Pain in joint of right hip  07/03/2020   Pain in right knee 12/14/2018   Recurrent syncope    Rib pain 07/03/2020   Sciatica of right side 09/22/2018   Severe alcohol use disorder    Vitamin D  deficiency    Past Surgical History:  Procedure Laterality Date   APPENDECTOMY     CARDIAC CATHETERIZATION N/A 03/25/2015   Procedure: Left Heart Cath and Coronary Angiography;  Surgeon: Alm LELON Clay, MD;  Location: [facility] INVASIVE CV LAB;  Service: Cardiovascular;  Laterality: N/A;   CORONARY STENT INTERVENTION N/A 10/09/2018   Procedure: CORONARY STENT INTERVENTION;  Surgeon: Dann Candyce RAMAN, MD;  Location: [facility] INVASIVE CV LAB;  Service: Cardiovascular;  Laterality: N/A;   DILATATION & CURETTAGE/HYSTEROSCOPY WITH MYOSURE N/A 05/19/2016   Procedure: DILATATION & CURETTAGE/HYSTEROSCOPY WITH MYOSURE;  Surgeon: Hargis Paradise, MD;  Location: [facility];  Service: Gynecology;  Laterality: N/A;  Possible Myosure for polyps.   EYE SURGERY     LEFT HEART CATH AND CORONARY ANGIOGRAPHY N/A 10/09/2018   Procedure: LEFT HEART CATH AND CORONARY ANGIOGRAPHY;  Surgeon: Dann Candyce RAMAN, MD;  Location: [facility] INVASIVE CV LAB;  Service: Cardiovascular;  Laterality: N/A;   MOUTH SURGERY     URETHRAL DILATION     Patient Active Problem List   Diagnosis Date Noted   B12 deficiency 10/24/2023   Seasonal allergies 10/21/2023  Gastroesophageal reflux disease without esophagitis 10/21/2023   Epigastric pain 10/21/2023   Stress 02/02/2023   Insomnia 12/23/2022   Blood clotting disorder 10/28/2022   Neck pain 10/27/2022   Acute thoracic back pain 10/27/2022   Lumbar radiculopathy 05/26/2022   Degeneration of lumbar intervertebral disc 05/26/2022   Major depressive disorder    Severe alcohol use disorder    Generalized anxiety disorder    Obstructive sleep apnea    Hepatic steatosis 04/22/2022   Osteoarthritis of carpometacarpal (CMC) joint of thumb 04/20/2022   Acquired trigger finger of right ring finger 04/20/2022   Bilateral wrist pain  04/20/2022   Chalazion of left lower eyelid 04/15/2022   Bipolar 1 disorder (HCC)    Cervical radiculopathy 10/14/2021   Pain in joint of right hip 07/03/2020   Rib pain 07/03/2020   Recurrent syncope    Orthostasis    Lactic acidosis 12/18/2019   Hematemesis 12/18/2019   Pain in right knee 12/14/2018   PAF (paroxysmal atrial fibrillation) 10/09/2018   Dorsalgia 09/22/2018   Dysphagia 09/22/2018   GERD without esophagitis 09/22/2018   Irritable bowel syndrome without diarrhea 09/22/2018   Osteoarthritis of knee 09/22/2018   Polypharmacy 09/22/2018   Sciatica of right side 09/22/2018   Vitamin D  deficiency 09/22/2018   Mixed hyperlipidemia 05/07/2016   Chronic atrial fibrillation (HCC) 09/08/2015   Coronary artery disease involving native coronary artery with angina pectoris 03/24/2015   Essential hypertension 03/03/2015   Chest pain 02/11/2015   Non-ST elevation MI (NSTEMI) (HCC) 07/26/2008    PCP: Benton Gave, PA  REFERRING PROVIDER: Lauraine Hamlet, FNP  REFERRING DIAG: left shoulder and low back pain (patient on provider's notes confirm that it is the right shoulder)   THERAPY DIAG:  Muscle weakness (generalized)  Other symptoms and signs involving the musculoskeletal system  Other abnormalities of gait and mobility  Rationale for Evaluation and Treatment: Rehabilitation  ONSET DATE: 2 years ago (shoulder) (Back): Fall several years ago  SUBJECTIVE:                                                                                                                                                                                      SUBJECTIVE STATEMENT: Patient reports just the usual pain. Mostly the left hip and left knee.   EVAL: Pt reports she hurts all over, stating the onset of her right shoulder pain occurred 2 years ago from an accident. She does not want to undergo surgery, even though this has been recommended. Reports her back pain is due to falling  several years ago. 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 She wants to improve her pain so she can continue to work her garden. No red flag symptoms.   Hand dominance: Right  PERTINENT HISTORY: See extensive PMH above   PAIN:  Are you having pain? Yes: NPRS scale: 2 Pain location: multi-joint pain-- mostly Lt hip and knee Pain description: sharp, dull, ache  Aggravating factors: standing for hours, stairs, reaching, walking Relieving factors: Pressure point therapy , hard ball, vibrating bed   PRECAUTIONS: Fall  WEIGHT BEARING RESTRICTIONS: No  FALLS:  Has patient fallen in last 6 months? Yes. Number of falls 2x cannot see very well and was in a hurry   LIVING ENVIRONMENT: Lives with: lives alone Lives in: House/apartment Stairs: Yes: Internal: 3 stories steps; can reach both Has following equipment at home: None  OCCUPATION: Employee at [employer]   PLOF: Independent  PATIENT GOALS: Capacity to maintain my garden and keep my house  NEXT MD VISIT: TBD  OBJECTIVE:  Note: Objective measures were completed at Evaluation unless otherwise noted. PATIENT SURVEYS :  PSFS: THE PATIENT SPECIFIC FUNCTIONAL SCALE  Place score of 0-10 (0 = unable to perform activity and 10 = able to perform activity at the same level as before injury or problem)  Activity Date: 06/28/24    Able to kneel  0    2.  Walk into grocery store without LBP 0    3.  Reach for dish in cabinet  0         Total Score 0      Total Score = Sum of activity scores/number of activities  Minimally Detectable Change: 3 points (for single activity); 2 points (for average score)  [facility] (nd). The Patient Specific Functional Scale . Retrieved from [email or web address]  Cannot kneel down  COGNITION: Overall cognitive status: Within functional limits for tasks assessed     POSTURE: Forward head  posture  Lumbar ROM: Limited 75% extension pain Flexion WFL pain Left flexion 25% limited pain Right flexion 25% limited pain  UPPER EXTREMITY ROM:  Active ROM Right eval Left eval Right 07/17/24  Shoulder flexion 50 active 120 passive WFL  162 degrees AROM  Shoulder extension [facility]   Shoulder abduction 55 deg active 128 passive 105 deg    Shoulder adduction     Shoulder internal rotation [facility] WFL   Shoulder external rotation Limited 50% WFL   Elbow flexion     Elbow extension     Wrist flexion     Wrist extension     Wrist ulnar deviation     Wrist radial deviation     Wrist pronation     Wrist supination     (Blank rows = not tested)  UPPER EXTREMITY MMT: MMT Right eval Left eval  Shoulder flexion 3-   Shoulder extension 3+   Shoulder abduction 3-   Shoulder adduction    Shoulder internal rotation 4-   Shoulder external rotation 3+   Middle trapezius    Lower trapezius    Elbow flexion    Elbow extension    Wrist flexion    Wrist extension    Wrist ulnar deviation    Wrist radial deviation    Wrist pronation    Wrist supination    Grip strength (lbs)    (Blank rows = not tested)  LOWER EXTREMITY MMT:   MMT Right eval Left eval  Hip flexion 4 4  Hip extension    Hip abduction    Hip adduction  Hip internal rotation    Hip external rotation    Knee flexion 5 4- pain   Knee extension 4 4  Ankle dorsiflexion    Ankle plantarflexion    Ankle inversion    Ankle eversion     (Blank rows = not tested)   Functional testing: Tandem stance: 8 sec before balance loss Single limb stance bilaterally: unable  Sit to stands 19.11 sec  [facility] Adult PT Treatment:                                                DATE: 07/20/24 Therapeutic Exercise: NuStep level 5 x 6 minutes UE/LE for endurance Standing calf raise 2 x 10  Knee flexion ROM on step x 10; bilateral Standing HS curl 2 x 10  HEP update  Neuromuscular re-ed: Seated march with core  activation 2 x 10  Standing hip abduction 2 x 10  Standing hip extension 2 x 10 Therapeutic Activity: Sit to stand 3 x 5  Step taps 6 inch 2 x 10    [facility] Adult PT Treatment:                                                DATE: 07/17/24 Therapeutic Exercise: Heel slides x 10 x 2 sets on R and L sides SLR x 10 reps x 2 sets  Standing AAROM shoulder flexion on physioball/countertop, then wall-- added to HEP Therapeutic Activity: Sidelying open book  Seated scapular retraction Seated L x 10 reps Self Care: Discussed regular HEP and AAROM for shoulder function Discussed gym considerations and need for regular, gentle movement  [facility] Adult PT Treatment:                                                DATE: 07/12/24 Therapeutic Exercise: HEP review and update- educating patient on importance of adhering to prescribed program Heel slides x 10; 5 sec; LLE  SLR 2 x 10  Hip adduction isometric ball squeeze 2 x 10; 5 sec hold  LAQ 2 x 10   Self Care: Discussed community gym options providing contact information            PATIENT EDUCATION: Education details: HEP update Person educated: Patient Education method: Explanation, Demonstration, Tactile cues, and Verbal cues, handout Education comprehension: verbalized understanding, returned demonstration, verbal cues required, tactile cues required, and needs further education  HOME EXERCISE PROGRAM: Access Code: [email or web address] URL: [email or web address] Date: 07/20/2024 Prepared by: Lucie Meeter  Exercises - Standing Isometric Shoulder Flexion with Doorway - Arm Bent  - 1 x daily - 7 x weekly - 3 sets - 10 reps - 3-5 sec hold - Standing Isometric Shoulder Abduction with Doorway - Arm Bent  - 1 x daily - 7 x weekly - 3 sets - 10 reps - 3-5 sec hold - Standing Isometric Shoulder External Rotation with Doorway and Towel Roll  - 1 x daily - 7 x weekly - 3 sets - 10 reps - 3-5 sec hold - Seated Scapular Retraction  - 1 x daily - 7  x weekly -  3 sets - 10 reps - 3-5 sec hold - Standing Single Arm Shoulder Flexion Stretch on Wall  - 1 x daily - 7 x weekly - 2 sets - 10 reps - Shoulder External Rotation and Scapular Retraction  - 1 x daily - 7 x weekly - 3 sets - 10 reps - 3-5 sec hold - Supine Active Straight Leg Raise  - 1 x daily - 7 x weekly - 2 sets - 10 reps - Supine Hip Adduction Isometric with Ball  - 1 x daily - 7 x weekly - 2 sets - 10 reps - Seated Long Arc Quad  - 1 x daily - 7 x weekly - 2 sets - 10 reps - Standing Knee Flexion Stretch on Step  - 1 x daily - 7 x weekly - 1 sets - 10 reps - 5 sec  hold - Standing Hip Abduction with Counter Support  - 1 x daily - 7 x weekly - 2 sets - 10 reps - Standing Hip Extension with Counter Support  - 1 x daily - 7 x weekly - 2 sets - 10 reps - Alternating Step Taps with Counter Support  - 1 x daily - 7 x weekly - 2 sets - 10 reps - Standing Knee Flexion  - 1 x daily - 7 x weekly - 2 sets - 10 reps  ASSESSMENT:  CLINICAL IMPRESSION: Patient reports low level of pain upon arrival. Focused on lower extremity functional strengthening as she reports most area of pain is in the hip and knee. Introduced standing strengthening with patient quickly fatiguing with targeted hip strengthening. Required occasional seated rest break secondary to fatigue. No increase in pain noted throughout session.   EVAL: Patient is a 68 y.o. female who was seen today for physical therapy evaluation and treatment for Right shoulder pain and low back pain, though patient endorses multi-joint pain that is severely limiting her overall functional capacity. Upon assessment pt severely limited right shoulder ROM, however passively close to [facility]. Pt unable to hold tandem stance longer than 5 sec, unable to complete single limb stance bilaterally. She also has lower extremity and upper extremity strength deficits. Pt loves working at her job, and wants to get better so she can work with out pain, upkeep her house,  and continue treating her garden. Pt will benefit from skilled therapy to address the deficits below.    OBJECTIVE IMPAIRMENTS: Abnormal gait, cardiopulmonary status limiting activity, decreased activity tolerance, decreased balance, decreased cognition, decreased coordination, decreased endurance, decreased mobility, difficulty walking, decreased ROM, decreased strength, increased fascial restrictions, increased muscle spasms, impaired flexibility, impaired UE functional use, improper body mechanics, postural dysfunction, and pain.   GOALS: Goals reviewed with patient? Yes  SHORT TERM GOALS: Target date: 07/27/2024  Pt will be efficient with initial HEP so she has increased independence for everyday activities.  Baseline: no time to issue  Goal status: MET  2.  Pt will improve PSFS score by at least 3 points to improve overall endurance to walk from car to grocery store and back with less than or =5/10 reports of pain  Baseline: 0 Goal status: INITIAL  3.  Pt will be able to reach for objects at chest height with right shoulder so she improves functional capacity for painting and reaching for items at grocery store Baseline: unable Goal status: INITIAL  4. Patient will complete 5 x STS in </= 15 seconds to improve functional strength.   Baseline: see above   Goal  status: INITIAL    LONG TERM GOALS: Target date: 08/25/24  Pt will be efficient with advanced HEP so she has increased independence for everyday activities.  Baseline:  Goal status: INITIAL  2.  Pt will maintain tandem stance  longer than 10 sec without UE support so she has improved balance and is a decreased fall risk Baseline: Unable Goal status: INITIAL  3.  Pt will perform kneeling to the floor from standing using light UE support so she improves functional capacity for gardening and household chores Baseline: unable Goal status: INITIAL    PLAN: PT FREQUENCY: 1-2x/week  PT DURATION: 8 weeks  PLANNED  INTERVENTIONS: 97164- PT Re-evaluation, 97750- Physical Performance Testing, 97110-Therapeutic exercises, 97530- Therapeutic activity, [id number]- Neuromuscular re-education, 97535- Self Care, [id number]- Manual therapy, [id number]- Gait training, [phone]- Aquatic Therapy, [phone] (1-2 muscles), [id number] (3+ muscles)- Dry Needling, Patient/Family education, Balance training, Stair training, Cryotherapy, and Moist heat  PLAN FOR NEXT SESSION: review and progress HEP prn; shoulder ISO, shoulder mobility; general strengthening.   Collier Monica, PT, DPT, ATC 07/20/2024 10:13 AM  "

## 2024-07-22 NOTE — Progress Notes (Unsigned)
 " Cardiology Office Note   Date:  07/23/2024  ID:  Lindsay [name], DOB 23-Jan-1956, MRN [id number] PCP: Lowella Benton CROME, PA  [facility] Providers Cardiologist:  None     PMH Dyslipidemia Atrial fibrillation Not on anticoagulation given history of GIB Coronary artery disease S/p MI in 2010 S/p PCI 2020 to D1 Bipolar disorder Hypertension Chronic venous insufficiency Left bundle branch block  History of MI in 2010.  She had recurrent chest pain in August 2016 and underwent cardiac catheterization which revealed 50% RCA lesion but otherwise minimal CAD.  Imdur  was discontinued due to headache.  She had repeat angina and cath March 2020 found 75% and 90% D1 lesions with successful PCI.  She was referred to outpatient PT for gait instability.  Saw Dr. Raford 07/01/2022 with BP well-controlled and no symptoms concerning for angina.  Referred to Dr. Mona for management of lipids and seen 01/28/2023.  Intolerant to statins and Zetia .  Was started on Repatha  funded through health will grant through 09/2023.  Seen by Reche Finder, NP on 07/18/2023 noting a stressful year but no recurrent chest pain.  Occasional lightheadedness which she attributed to lack of sleep.  Rare palpitations which are fleeting and not bothersome.  She remained off OAC due to history of GI bleed.  She continued to work part-time at [employer] and did some gardening. One year follow-up was recommended.  Seen by Dr. Mona for follow-up of lipids 10/18/2023.  LP(a) is negative.  LDL was 106, significantly improved from 223 years prior.  Goal for LDL is 55 or lower, unfortunately she is intolerant to statins and ezetimibe .  History of Present Illness  Discussed the use of AI scribe software for clinical note transcription with the patient, who gave verbal consent to proceed.  History of Present Illness Lindsay [name] is a very pleasant 68 year old female who presents today with concerns about elevated heart rate and  dizziness. She notices higher nocturnal heart rates in the 70s to 90s, which previously dropped to the 60s during sleep. She has intermittent pinchy chest pain under the left breast for about a month, mainly while in bed.  She is experiencing increased dizziness and imbalance that worsened after a fall 2.5 to 3 weeks ago in which she struck her forehead on a table.  She is concerned she may have a concussion, but symptoms are improving with better hydration.  No presyncope or syncope.  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 She uses compression socks and leg elevation.  No orthopnea or PND.  She reports fatigue and pain that make prolonged standing at work difficult. She is worried about memory decline and possible vascular dementia, which affected her mother and possibly her grandmother. Her father had a major brain hemorrhage later in life. She also mentions prior ADHD and depression.  No problems with Repatha  injections, however she did have inconsistent dosing for a few months due to not picking up refills.  She currently has a 51-month supply. She provides a lot of details about chronic sinus issues and potentially has black mold in her home.  She continues to have occasional hematemesis, with no significant increase in bleeding.  ROS: See HPI  Studies Reviewed EKG Interpretation Date/Time:  Monday July 23 2024 08:51:59 EST Ventricular Rate:  76 PR Interval:  162 QRS Duration:  158 QT Interval:  448 QTC Calculation: 504 R Axis:   -34  Text Interpretation:  Normal sinus rhythm Left axis deviation Left bundle branch block When compared with ECG of 18-Jul-2023 11:51, No significant change was found Confirmed by Percy Browning [id number]) on 07/23/2024 9:06:55 AM     Lipoprotein (a)  Date/Time Value Ref Range Status  04/07/2023 03:12 PM <8.4 <75.0 nmol/L Final    Comment:    **Results verified by repeat testing** Note:  Values  greater than or equal to 75.0 nmol/L may        indicate an independent risk factor for CHD,        but must be evaluated with caution when applied        to non-Caucasian populations due to the        influence of genetic factors on Lp(a) across        ethnicities.     Risk Assessment/Calculations  CHA2DS2-VASc Score = 4   This indicates a 4.8% annual risk of stroke. The patient's score is based upon: CHF History: 0 HTN History: 1 Diabetes History: 0 Stroke History: 0 Vascular Disease History: 1 Age Score: 1 Gender Score: 1            Physical Exam VS:  BP 126/78 (Patient Position: Sitting, Cuff Size: Normal)   Pulse 76   Resp 16   Ht 5' 2.25 (1.581 m)   Wt 170 lb (77.1 kg)   SpO2 99%   BMI 30.84 kg/m    Wt Readings from Last 3 Encounters:  07/23/24 170 lb (77.1 kg)  02/01/24 169 lb (76.7 kg)  12/26/23 172 lb (78 kg)    GEN: Well nourished, well developed in no acute distress NECK: No JVD; No carotid bruits CARDIAC: RRR, no murmurs, rubs, gallops RESPIRATORY:  Clear to auscultation without rales, wheezing or rhonchi  ABDOMEN: Soft, non-tender, non-distended EXTREMITIES:  No edema; No deformity   ASSESSMENT AND PLAN Assessment and Plan Assessment & Plan Coronary artery disease   Tachycardia LBBB Intermittent chest pain described as a pinching sensation under her left breast that often occurs while lying down.  Symptoms do not worsen with exertion and she does not have shortness of breath, diaphoresis, or n/v associated with chest discomfort.  Intermittent tachycardia occurring mostly at night.  She notes HR  90s to low 100s, with occasional resting heart rates in the 60s.  She reports this was previously lower.  She has been taking nitroglycerin  for elevated HR which may worsen dizziness.  We reviewed the actions of nitroglycerin  and metoprolol  for management of chest pain and tachycardia.  EKG today reveals sinus rhythm at 76 bpm, LBBB, known.  No acute ST/T  changes. - Recommend increase metoprolol  to 25 mg at bedtime, continue 12.5 mg in the mornings - Echocardiogram to assess heart and valve function - Use nitroglycerin  only for chest pain to avoid hypotension and dizziness.  PAF History of GI bleeding Hematemesis History of paroxysmal A-fib.  No significant palpitations recently.  She is having some variations in HR as noted above but no significant highs.  Not on OAC due to history of GI bleed.  HR is currently well-controlled.  Will trial increased dose of metoprolol  at bedtime.  Stroke risk is elevated with CHA2DS2-VASc score of 4. - Increase metoprolol  to tartrate to 25 mg at bedtime and continue 12.5 mg in the mornings  - Report worsening palpitations or consistently elevated HR - Regular follow-up with GI recommended  Hyperlipidemia LDL goal < 55 Statin myalgia Lipid panel completed 10/14/2023 with total cholesterol 189, triglycerides 97, HDL  66, and LDL-C 106.  Intolerance to multiple statins.  LDL significantly improved from 221 2 years prior.  She is tolerating Repatha  without any concerning side effects. Missed doses recently but has 74-month supply now.  - Repeat cholesterol screening in two months for surveillance  - Check apolipoprotein B levels for further risk stratification  Obstructive sleep apnea   Chronic sinus congestion Diagnosed with sleep apnea, unable to tolerate CPAP.  Reports longstanding sinus congestion and allergic rhinitis with difficulty sleeping at times when lying flat.  We discussed the inspire device and she is interested in further evaluation.   - Refer to ENT for evaluation of sinuses and consideration of Inspire device  Dizziness  Gait instability    Recent head injury with dizziness and balance disturbances, improving with hydration. She is concerned about a concussion. Symptoms are improving with hydration and rest.  Occasional sleep disturbances also likely contribute to symptoms.  She has been taking  nitroglycerin  to manage HR  > 100 BPM.  Encouraged use of metoprolol  rather than nitroglycerin  for management of tachycardia.   - Ensure adequate hydration - Avoid using nitroglycerin  for management of elevated HR         Dispo: 3 months with me  Signed, Rosaline Bane, NP-C "

## 2024-07-23 ENCOUNTER — Encounter (HOSPITAL_BASED_OUTPATIENT_CLINIC_OR_DEPARTMENT_OTHER): Payer: Self-pay

## 2024-07-23 ENCOUNTER — Ambulatory Visit (INDEPENDENT_AMBULATORY_CARE_PROVIDER_SITE_OTHER): Admitting: Nurse Practitioner

## 2024-07-23 ENCOUNTER — Ambulatory Visit

## 2024-07-23 ENCOUNTER — Encounter (HOSPITAL_BASED_OUTPATIENT_CLINIC_OR_DEPARTMENT_OTHER): Payer: Self-pay | Admitting: Nurse Practitioner

## 2024-07-23 VITALS — BP 126/78 | HR 76 | Resp 16 | Ht 62.25 in | Wt 170.0 lb

## 2024-07-23 DIAGNOSIS — R42 Dizziness and giddiness: Secondary | ICD-10-CM | POA: Diagnosis not present

## 2024-07-23 DIAGNOSIS — M791 Myalgia, unspecified site: Secondary | ICD-10-CM | POA: Diagnosis not present

## 2024-07-23 DIAGNOSIS — R000 Tachycardia, unspecified: Secondary | ICD-10-CM | POA: Diagnosis not present

## 2024-07-23 DIAGNOSIS — I499 Cardiac arrhythmia, unspecified: Secondary | ICD-10-CM | POA: Diagnosis not present

## 2024-07-23 DIAGNOSIS — I10 Essential (primary) hypertension: Secondary | ICD-10-CM | POA: Diagnosis not present

## 2024-07-23 DIAGNOSIS — I2510 Atherosclerotic heart disease of native coronary artery without angina pectoris: Secondary | ICD-10-CM | POA: Diagnosis not present

## 2024-07-23 DIAGNOSIS — T466X5D Adverse effect of antihyperlipidemic and antiarteriosclerotic drugs, subsequent encounter: Secondary | ICD-10-CM

## 2024-07-23 DIAGNOSIS — G4733 Obstructive sleep apnea (adult) (pediatric): Secondary | ICD-10-CM

## 2024-07-23 DIAGNOSIS — I480 Paroxysmal atrial fibrillation: Secondary | ICD-10-CM

## 2024-07-23 DIAGNOSIS — T466X5A Adverse effect of antihyperlipidemic and antiarteriosclerotic drugs, initial encounter: Secondary | ICD-10-CM

## 2024-07-23 DIAGNOSIS — K920 Hematemesis: Secondary | ICD-10-CM

## 2024-07-23 DIAGNOSIS — R0981 Nasal congestion: Secondary | ICD-10-CM | POA: Diagnosis not present

## 2024-07-23 DIAGNOSIS — I447 Left bundle-branch block, unspecified: Secondary | ICD-10-CM

## 2024-07-23 DIAGNOSIS — E785 Hyperlipidemia, unspecified: Secondary | ICD-10-CM | POA: Diagnosis not present

## 2024-07-23 DIAGNOSIS — R2681 Unsteadiness on feet: Secondary | ICD-10-CM

## 2024-07-23 MED ORDER — METOPROLOL TARTRATE 25 MG PO TABS: ORAL_TABLET | ORAL | Status: DC

## 2024-07-23 NOTE — Patient Instructions (Signed)
 Medication Instructions:   CHANGE Lopressor  Take one half (0.5) tablet in the am and one (1) tablet ( 25 mg) in the pm.  *If you need a refill on your cardiac medications before your next appointment, please call your pharmacy*  Lab Work:  After 4 injections of Repatha  please get fasting labs, fasting after midnight. Patient given paperwork today.   If you have labs (blood work) drawn today and your tests are completely normal, you will receive your results only by: MyChart Message (if you have MyChart) OR A paper copy in the mail If you have any lab test that is abnormal or we need to change your treatment, we will call you to review the results.  Testing/Procedures:  Your physician has requested that you have an echocardiogram. Echocardiography is a painless test that uses sound waves to create images of your heart. It provides your doctor with information about the size and shape of your heart and how well your hearts chambers and valves are working. This procedure takes approximately one hour. There are no restrictions for this procedure. Please do NOT wear cologne, perfume or lotions (deodorant is allowed). Please arrive 15 minutes prior to your appointment time.     Follow-Up: At [facility], you and your health needs are our priority.  As part of our continuing mission to provide you with exceptional heart care, our providers are all part of one team.  This team includes your primary Cardiologist (physician) and Advanced Practice Providers or APPs (Physician Assistants and Nurse Practitioners) who all work together to provide you with the care you need, when you need it.  Your next appointment:   3 month(s)  Provider:   Rosaline Bane, NP    We recommend signing up for the patient portal called MyChart.  Sign up information is provided on this After Visit Summary.  MyChart is used to connect with patients for Virtual Visits (Telemedicine).  Patients are able to  view lab/test results, encounter notes, upcoming appointments, etc.  Non-urgent messages can be sent to your provider as well.   To learn more about what you can do with MyChart, go to [email or web address].   Other Instructions  You have been referred to ENT for consideration of inspire. The office will call you to schedule appointment.

## 2024-07-27 ENCOUNTER — Ambulatory Visit: Attending: Family Medicine

## 2024-07-27 DIAGNOSIS — R2689 Other abnormalities of gait and mobility: Secondary | ICD-10-CM | POA: Insufficient documentation

## 2024-07-27 DIAGNOSIS — G8929 Other chronic pain: Secondary | ICD-10-CM | POA: Insufficient documentation

## 2024-07-27 DIAGNOSIS — R262 Difficulty in walking, not elsewhere classified: Secondary | ICD-10-CM | POA: Insufficient documentation

## 2024-07-27 DIAGNOSIS — R29898 Other symptoms and signs involving the musculoskeletal system: Secondary | ICD-10-CM | POA: Insufficient documentation

## 2024-07-27 DIAGNOSIS — M6281 Muscle weakness (generalized): Secondary | ICD-10-CM | POA: Insufficient documentation

## 2024-07-27 DIAGNOSIS — M25561 Pain in right knee: Secondary | ICD-10-CM | POA: Insufficient documentation

## 2024-07-27 DIAGNOSIS — M25562 Pain in left knee: Secondary | ICD-10-CM | POA: Insufficient documentation

## 2024-07-29 ENCOUNTER — Other Ambulatory Visit: Payer: Self-pay | Admitting: Internal Medicine

## 2024-07-29 DIAGNOSIS — I10 Essential (primary) hypertension: Secondary | ICD-10-CM

## 2024-07-31 ENCOUNTER — Ambulatory Visit

## 2024-07-31 DIAGNOSIS — M25561 Pain in right knee: Secondary | ICD-10-CM | POA: Diagnosis present

## 2024-07-31 DIAGNOSIS — G8929 Other chronic pain: Secondary | ICD-10-CM

## 2024-07-31 DIAGNOSIS — R29898 Other symptoms and signs involving the musculoskeletal system: Secondary | ICD-10-CM | POA: Diagnosis present

## 2024-07-31 DIAGNOSIS — R2689 Other abnormalities of gait and mobility: Secondary | ICD-10-CM

## 2024-07-31 DIAGNOSIS — M25562 Pain in left knee: Secondary | ICD-10-CM | POA: Diagnosis present

## 2024-07-31 DIAGNOSIS — M6281 Muscle weakness (generalized): Secondary | ICD-10-CM

## 2024-07-31 DIAGNOSIS — R262 Difficulty in walking, not elsewhere classified: Secondary | ICD-10-CM

## 2024-07-31 NOTE — Therapy (Signed)
 " OUTPATIENT PHYSICAL THERAPY TREATMENT   Patient Name: Lindsay [name] MRN: [id number] DOB:August 15, 1955, 69 y.o., female Today's Date: 07/31/2024  END OF SESSION:  PT End of Session - 07/31/24 1108     Visit Number 6    Number of Visits 17    Date for Recertification  08/25/24    Authorization Type Medicare Part A and B    Progress Note Due on Visit 10    PT Start Time 1109   pt arrived late   PT Stop Time 1147    PT Time Calculation (min) 38 min    Activity Tolerance Patient tolerated treatment well    Behavior During Therapy [facility] for tasks assessed/performed            Past Medical History:  Diagnosis Date   Abnormal mammogram    Thick Tissue   Acquired trigger finger of right ring finger 04/20/2022   Arthritis    Bilateral wrist pain 04/20/2022   Bipolar 1 disorder    Cataract    OU   Cervical radiculopathy 10/14/2021   Chalazion of left lower eyelid 04/15/2022   Chest pain 02/11/2015   Chronic atrial fibrillation 09/08/2015   Colon polyp    Compressed cervical disc    Coronary artery disease involving native coronary artery with angina pectoris 03/24/2015   Dorsalgia 09/22/2018   Dysphagia 09/22/2018   Essential hypertension 03/03/2015   Generalized anxiety disorder    GERD without esophagitis 09/22/2018   Hematemesis 12/18/2019   Hepatic steatosis 04/22/2022   History of appendectomy 1963   Hypertensive retinopathy    OU   Irritable bowel syndrome without diarrhea 09/22/2018   Lactic acidosis 12/18/2019   LLQ pain 04/15/2022   Major depressive disorder    Migraine without status migrainosus, not intractable 09/22/2018   Mixed hyperlipidemia 05/07/2016   Non-ST elevation MI (NSTEMI) 07/26/2008   No cardiac catheterization at the time.   Obstructive sleep apnea    no CPAP use   Orthostasis    Osteoarthritis of carpometacarpal (CMC) joint of thumb 04/20/2022   Osteoarthritis of knee 09/22/2018   PAF (paroxysmal atrial fibrillation) 10/09/2018   Pain in joint  of right hip 07/03/2020   Pain in right knee 12/14/2018   Recurrent syncope    Rib pain 07/03/2020   Sciatica of right side 09/22/2018   Severe alcohol use disorder    Vitamin D  deficiency    Past Surgical History:  Procedure Laterality Date   APPENDECTOMY     CARDIAC CATHETERIZATION N/A 03/25/2015   Procedure: Left Heart Cath and Coronary Angiography;  Surgeon: Alm LELON Clay, MD;  Location: [facility] INVASIVE CV LAB;  Service: Cardiovascular;  Laterality: N/A;   CORONARY STENT INTERVENTION N/A 10/09/2018   Procedure: CORONARY STENT INTERVENTION;  Surgeon: Dann Candyce RAMAN, MD;  Location: [facility] INVASIVE CV LAB;  Service: Cardiovascular;  Laterality: N/A;   DILATATION & CURETTAGE/HYSTEROSCOPY WITH MYOSURE N/A 05/19/2016   Procedure: DILATATION & CURETTAGE/HYSTEROSCOPY WITH MYOSURE;  Surgeon: Hargis Paradise, MD;  Location: [facility];  Service: Gynecology;  Laterality: N/A;  Possible Myosure for polyps.   EYE SURGERY     LEFT HEART CATH AND CORONARY ANGIOGRAPHY N/A 10/09/2018   Procedure: LEFT HEART CATH AND CORONARY ANGIOGRAPHY;  Surgeon: Dann Candyce RAMAN, MD;  Location: [facility] INVASIVE CV LAB;  Service: Cardiovascular;  Laterality: N/A;   MOUTH SURGERY     URETHRAL DILATION     Patient Active Problem List   Diagnosis Date Noted   B12 deficiency 10/24/2023  Seasonal allergies 10/21/2023   Gastroesophageal reflux disease without esophagitis 10/21/2023   Epigastric pain 10/21/2023   Stress 02/02/2023   Insomnia 12/23/2022   Blood clotting disorder 10/28/2022   Neck pain 10/27/2022   Acute thoracic back pain 10/27/2022   Lumbar radiculopathy 05/26/2022   Degeneration of lumbar intervertebral disc 05/26/2022   Major depressive disorder    Severe alcohol use disorder    Generalized anxiety disorder    Obstructive sleep apnea    Hepatic steatosis 04/22/2022   Osteoarthritis of carpometacarpal (CMC) joint of thumb 04/20/2022   Acquired trigger finger of right ring finger 04/20/2022    Bilateral wrist pain 04/20/2022   Chalazion of left lower eyelid 04/15/2022   Bipolar 1 disorder (HCC)    Cervical radiculopathy 10/14/2021   Pain in joint of right hip 07/03/2020   Rib pain 07/03/2020   Recurrent syncope    Orthostasis    Lactic acidosis 12/18/2019   Hematemesis 12/18/2019   Pain in right knee 12/14/2018   PAF (paroxysmal atrial fibrillation) 10/09/2018   Dorsalgia 09/22/2018   Dysphagia 09/22/2018   GERD without esophagitis 09/22/2018   Irritable bowel syndrome without diarrhea 09/22/2018   Osteoarthritis of knee 09/22/2018   Polypharmacy 09/22/2018   Sciatica of right side 09/22/2018   Vitamin D  deficiency 09/22/2018   Mixed hyperlipidemia 05/07/2016   Chronic atrial fibrillation (HCC) 09/08/2015   Coronary artery disease involving native coronary artery with angina pectoris 03/24/2015   Essential hypertension 03/03/2015   Chest pain 02/11/2015   Non-ST elevation MI (NSTEMI) (HCC) 07/26/2008    PCP: Benton Gave, PA  REFERRING PROVIDER: Lauraine Hamlet, FNP  REFERRING DIAG: left shoulder and low back pain (patient on provider's notes confirm that it is the right shoulder)   THERAPY DIAG:  Muscle weakness (generalized)  Other symptoms and signs involving the musculoskeletal system  Other abnormalities of gait and mobility  Chronic pain of both knees  Difficulty in walking, not elsewhere classified  Rationale for Evaluation and Treatment: Rehabilitation  ONSET DATE: 2 years ago (shoulder) (Back): Fall several years ago  SUBJECTIVE:                                                                                                                                                                                      SUBJECTIVE STATEMENT: Patient reports overall pain is improving but still fluctuates with weather. Able to walk longer distances safely.    EVAL: Pt reports she hurts all over, stating the onset of her right shoulder pain occurred 2  years ago from an accident. She does not want to undergo surgery, even though this has been recommended. Reports her back pain  is due to falling several years ago. 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 She wants to improve her pain so she can continue to work her garden. No red flag symptoms.   Hand dominance: Right  PERTINENT HISTORY: See extensive PMH above   PAIN:  Are you having pain? Yes: NPRS scale: 2 Pain location: multi-joint pain-- mostly Lt hip and knee Pain description: sharp, dull, ache  Aggravating factors: standing for hours, stairs, reaching, walking Relieving factors: Pressure point therapy , hard ball, vibrating bed   PRECAUTIONS: Fall  WEIGHT BEARING RESTRICTIONS: No  FALLS:  Has patient fallen in last 6 months? Yes. Number of falls 2x cannot see very well and was in a hurry   LIVING ENVIRONMENT: Lives with: lives alone Lives in: House/apartment Stairs: Yes: Internal: 3 stories steps; can reach both Has following equipment at home: None  OCCUPATION: Employee at [employer]   PLOF: Independent  PATIENT GOALS: Capacity to maintain my garden and keep my house  NEXT MD VISIT: TBD  OBJECTIVE:  Note: Objective measures were completed at Evaluation unless otherwise noted. PATIENT SURVEYS :  PSFS: THE PATIENT SPECIFIC FUNCTIONAL SCALE  Place score of 0-10 (0 = unable to perform activity and 10 = able to perform activity at the same level as before injury or problem)  Activity Date: 06/28/24 07/31/24   Able to kneel  0 2   2.  Walk into grocery store without LBP 0 6   3.  Reach for dish in cabinet  0 2        Total Score 0 10     Total Score = Sum of activity scores/number of activities  Minimally Detectable Change: 3 points (for single activity); 2 points (for average score)  [facility] (nd). The Patient Specific Functional Scale . Retrieved from  [email or web address]  Cannot kneel down  COGNITION: Overall cognitive status: Within functional limits for tasks assessed     POSTURE: Forward head posture  Lumbar ROM: Limited 75% extension pain Flexion WFL pain Left flexion 25% limited pain Right flexion 25% limited pain  UPPER EXTREMITY ROM:  Active ROM Right eval Left eval Right 07/17/24  Shoulder flexion 50 active 120 passive WFL  162 degrees AROM  Shoulder extension [facility]   Shoulder abduction 55 deg active 128 passive 105 deg    Shoulder adduction     Shoulder internal rotation [facility] WFL   Shoulder external rotation Limited 50% WFL   Elbow flexion     Elbow extension     Wrist flexion     Wrist extension     Wrist ulnar deviation     Wrist radial deviation     Wrist pronation     Wrist supination     (Blank rows = not tested)  UPPER EXTREMITY MMT: MMT Right eval Left eval  Shoulder flexion 3-   Shoulder extension 3+   Shoulder abduction 3-   Shoulder adduction    Shoulder internal rotation 4-   Shoulder external rotation 3+   Middle trapezius    Lower trapezius    Elbow flexion    Elbow extension    Wrist flexion    Wrist extension    Wrist ulnar deviation    Wrist radial deviation    Wrist pronation    Wrist supination    Grip strength (lbs)    (Blank rows = not tested)  LOWER EXTREMITY MMT:  MMT Right eval Left eval  Hip flexion 4 4  Hip extension    Hip abduction    Hip adduction    Hip internal rotation    Hip external rotation    Knee flexion 5 4- pain   Knee extension 4 4  Ankle dorsiflexion    Ankle plantarflexion    Ankle inversion    Ankle eversion     (Blank rows = not tested)   Functional testing: Tandem stance: 8 sec before balance loss Single limb stance bilaterally: unable  Sit to stands 19.11 sec    [facility] Adult PT Treatment:                                                DATE: 07/31/2024 Therapeutic  Exercise: NuStep level 5 x 6 minutes UE/LE for endurance Standing calf raise 2 x 10  Standing HS curl 2 x 10  Neuromuscular re-ed: Seated:  Marching for core activation --> repeated sitting on dyno disc Dead bug sitting on dyno disc Hip add ball squeeze 10x5 sec Knee extension + hip add ball squeeze x10 (alt) Side lying 2 x 10 (modified from standing d/t knee pain pin Rt SLS) Clamshells + red TB 2x10   [facility] Adult PT Treatment:                                                DATE: 07/20/24 Therapeutic Exercise: NuStep level 5 x 6 minutes UE/LE for endurance Standing calf raise 2 x 10  Knee flexion ROM on step x 10; bilateral Standing HS curl 2 x 10  HEP update  Neuromuscular re-ed: Seated march with core activation 2 x 10  Standing hip abduction 2 x 10  Standing hip extension 2 x 10 Therapeutic Activity: Sit to stand 3 x 5  Step taps 6 inch 2 x 10    [facility] Adult PT Treatment:                                                DATE: 07/17/24 Therapeutic Exercise: Heel slides x 10 x 2 sets on R and L sides SLR x 10 reps x 2 sets  Standing AAROM shoulder flexion on physioball/countertop, then wall-- added to HEP Therapeutic Activity: Sidelying open book  Seated scapular retraction Seated L x 10 reps Self Care: Discussed regular HEP and AAROM for shoulder function Discussed gym considerations and need for regular, gentle movement            PATIENT EDUCATION: Education details: HEP update Person educated: Patient Education method: Explanation, Demonstration, Tactile cues, and Verbal cues, handout Education comprehension: verbalized understanding, returned demonstration, verbal cues required, tactile cues required, and needs further education  HOME EXERCISE PROGRAM: Access Code: [email or web address] URL: [email or web address] Date: 07/20/2024 Prepared by: Lucie Meeter  Exercises - Standing Isometric Shoulder Flexion with Doorway - Arm Bent  - 1 x daily - 7 x weekly -  3 sets - 10 reps - 3-5 sec hold - Standing Isometric Shoulder Abduction with Doorway - Arm Bent  - 1 x daily - 7 x weekly -  3 sets - 10 reps - 3-5 sec hold - Standing Isometric Shoulder External Rotation with Doorway and Towel Roll  - 1 x daily - 7 x weekly - 3 sets - 10 reps - 3-5 sec hold - Seated Scapular Retraction  - 1 x daily - 7 x weekly - 3 sets - 10 reps - 3-5 sec hold - Standing Single Arm Shoulder Flexion Stretch on Wall  - 1 x daily - 7 x weekly - 2 sets - 10 reps - Shoulder External Rotation and Scapular Retraction  - 1 x daily - 7 x weekly - 3 sets - 10 reps - 3-5 sec hold - Supine Active Straight Leg Raise  - 1 x daily - 7 x weekly - 2 sets - 10 reps - Supine Hip Adduction Isometric with Ball  - 1 x daily - 7 x weekly - 2 sets - 10 reps - Seated Long Arc Quad  - 1 x daily - 7 x weekly - 2 sets - 10 reps - Standing Knee Flexion Stretch on Step  - 1 x daily - 7 x weekly - 1 sets - 10 reps - 5 sec  hold - Standing Hip Abduction with Counter Support  - 1 x daily - 7 x weekly - 2 sets - 10 reps - Standing Hip Extension with Counter Support  - 1 x daily - 7 x weekly - 2 sets - 10 reps - Alternating Step Taps with Counter Support  - 1 x daily - 7 x weekly - 2 sets - 10 reps - Standing Knee Flexion  - 1 x daily - 7 x weekly - 2 sets - 10 reps  ASSESSMENT:  CLINICAL IMPRESSION: Continued UE/LE strengthening exercises to progress functional strength and stability. Standing hip abduction modified to side lying to alleviate Rt knee pain in single leg stance. Moderate fatigue noted with hip abduction and rotator strengthening exercises; patient recovered quickly with brief rest breaks.   EVAL: Patient is a 69 y.o. female who was seen today for physical therapy evaluation and treatment for Right shoulder pain and low back pain, though patient endorses multi-joint pain that is severely limiting her overall functional capacity. Upon assessment pt severely limited right shoulder ROM, however  passively close to [facility]. Pt unable to hold tandem stance longer than 5 sec, unable to complete single limb stance bilaterally. She also has lower extremity and upper extremity strength deficits. Pt loves working at her job, and wants to get better so she can work with out pain, upkeep her house, and continue treating her garden. Pt will benefit from skilled therapy to address the deficits below.    OBJECTIVE IMPAIRMENTS: Abnormal gait, cardiopulmonary status limiting activity, decreased activity tolerance, decreased balance, decreased cognition, decreased coordination, decreased endurance, decreased mobility, difficulty walking, decreased ROM, decreased strength, increased fascial restrictions, increased muscle spasms, impaired flexibility, impaired UE functional use, improper body mechanics, postural dysfunction, and pain.   GOALS: Goals reviewed with patient? Yes  SHORT TERM GOALS: Target date: 07/27/2024  Pt will be efficient with initial HEP so she has increased independence for everyday activities.  Baseline: no time to issue  Goal status: MET  2.  Pt will improve PSFS score by at least 3 points to improve overall endurance to walk from car to grocery store and back with less than or =5/10 reports of pain  Baseline: 0 07/31/24: 10 Goal status: MET  3.  Pt will be able to reach for objects at chest height with  right shoulder so she improves functional capacity for painting and reaching for items at grocery store Baseline: unable 07/31/24: below shoulder height Goal status: NOT MET  4. Patient will complete 5 x STS in </= 15 seconds to improve functional strength.   Baseline: see above 07/31/24: 24 sec   Goal status: NOT MET   LONG TERM GOALS: Target date: 08/25/24  Pt will be efficient with advanced HEP so she has increased independence for everyday activities.  Baseline:  Goal status: INITIAL  2.  Pt will maintain tandem stance  longer than 10 sec without UE support so she has improved  balance and is a decreased fall risk Baseline: Unable Goal status: INITIAL  3.  Pt will perform kneeling to the floor from standing using light UE support so she improves functional capacity for gardening and household chores Baseline: unable Goal status: INITIAL    PLAN: PT FREQUENCY: 1-2x/week  PT DURATION: 8 weeks  PLANNED INTERVENTIONS: 97164- PT Re-evaluation, 97750- Physical Performance Testing, 97110-Therapeutic exercises, 97530- Therapeutic activity, [id number]- Neuromuscular re-education, 97535- Self Care, [id number]- Manual therapy, [id number]- Gait training, [id number]- Aquatic Therapy, [phone] (1-2 muscles), [id number] (3+ muscles)- Dry Needling, Patient/Family education, Balance training, Stair training, Cryotherapy, and Moist heat  PLAN FOR NEXT SESSION: review and progress HEP prn; shoulder ISO, shoulder mobility; general strengthening.   Lamarr Price, PTA 07/31/2024 11:48 AM  "

## 2024-08-01 ENCOUNTER — Ambulatory Visit (HOSPITAL_COMMUNITY): Admitting: Licensed Clinical Social Worker

## 2024-08-03 ENCOUNTER — Ambulatory Visit

## 2024-08-03 DIAGNOSIS — R262 Difficulty in walking, not elsewhere classified: Secondary | ICD-10-CM

## 2024-08-03 DIAGNOSIS — R29898 Other symptoms and signs involving the musculoskeletal system: Secondary | ICD-10-CM | POA: Diagnosis not present

## 2024-08-03 DIAGNOSIS — G8929 Other chronic pain: Secondary | ICD-10-CM

## 2024-08-03 DIAGNOSIS — R2689 Other abnormalities of gait and mobility: Secondary | ICD-10-CM

## 2024-08-03 DIAGNOSIS — M6281 Muscle weakness (generalized): Secondary | ICD-10-CM

## 2024-08-03 NOTE — Therapy (Signed)
 " OUTPATIENT PHYSICAL THERAPY TREATMENT   Patient Name: Lindsay [name] MRN: [id number] DOB:07/19/1956, 69 y.o., female Today's Date: 08/03/2024  END OF SESSION:  PT End of Session - 08/03/24 1105     Visit Number 7    Number of Visits 17    Date for Recertification  08/25/24    Authorization Type Medicare Part A and B    Progress Note Due on Visit 10    PT Start Time 1104    PT Stop Time 1147    PT Time Calculation (min) 43 min    Activity Tolerance Patient tolerated treatment well    Behavior During Therapy [facility] for tasks assessed/performed         Past Medical History:  Diagnosis Date   Abnormal mammogram    Thick Tissue   Acquired trigger finger of right ring finger 04/20/2022   Arthritis    Bilateral wrist pain 04/20/2022   Bipolar 1 disorder    Cataract    OU   Cervical radiculopathy 10/14/2021   Chalazion of left lower eyelid 04/15/2022   Chest pain 02/11/2015   Chronic atrial fibrillation 09/08/2015   Colon polyp    Compressed cervical disc    Coronary artery disease involving native coronary artery with angina pectoris 03/24/2015   Dorsalgia 09/22/2018   Dysphagia 09/22/2018   Essential hypertension 03/03/2015   Generalized anxiety disorder    GERD without esophagitis 09/22/2018   Hematemesis 12/18/2019   Hepatic steatosis 04/22/2022   History of appendectomy 1963   Hypertensive retinopathy    OU   Irritable bowel syndrome without diarrhea 09/22/2018   Lactic acidosis 12/18/2019   LLQ pain 04/15/2022   Major depressive disorder    Migraine without status migrainosus, not intractable 09/22/2018   Mixed hyperlipidemia 05/07/2016   Non-ST elevation MI (NSTEMI) 07/26/2008   No cardiac catheterization at the time.   Obstructive sleep apnea    no CPAP use   Orthostasis    Osteoarthritis of carpometacarpal (CMC) joint of thumb 04/20/2022   Osteoarthritis of knee 09/22/2018   PAF (paroxysmal atrial fibrillation) 10/09/2018   Pain in joint of right hip  07/03/2020   Pain in right knee 12/14/2018   Recurrent syncope    Rib pain 07/03/2020   Sciatica of right side 09/22/2018   Severe alcohol use disorder    Vitamin D  deficiency    Past Surgical History:  Procedure Laterality Date   APPENDECTOMY     CARDIAC CATHETERIZATION N/A 03/25/2015   Procedure: Left Heart Cath and Coronary Angiography;  Surgeon: Alm LELON Clay, MD;  Location: [facility] INVASIVE CV LAB;  Service: Cardiovascular;  Laterality: N/A;   CORONARY STENT INTERVENTION N/A 10/09/2018   Procedure: CORONARY STENT INTERVENTION;  Surgeon: Dann Candyce RAMAN, MD;  Location: [facility] INVASIVE CV LAB;  Service: Cardiovascular;  Laterality: N/A;   DILATATION & CURETTAGE/HYSTEROSCOPY WITH MYOSURE N/A 05/19/2016   Procedure: DILATATION & CURETTAGE/HYSTEROSCOPY WITH MYOSURE;  Surgeon: Hargis Paradise, MD;  Location: [facility];  Service: Gynecology;  Laterality: N/A;  Possible Myosure for polyps.   EYE SURGERY     LEFT HEART CATH AND CORONARY ANGIOGRAPHY N/A 10/09/2018   Procedure: LEFT HEART CATH AND CORONARY ANGIOGRAPHY;  Surgeon: Dann Candyce RAMAN, MD;  Location: [facility] INVASIVE CV LAB;  Service: Cardiovascular;  Laterality: N/A;   MOUTH SURGERY     URETHRAL DILATION     Patient Active Problem List   Diagnosis Date Noted   B12 deficiency 10/24/2023   Seasonal allergies 10/21/2023   Gastroesophageal reflux  disease without esophagitis 10/21/2023   Epigastric pain 10/21/2023   Stress 02/02/2023   Insomnia 12/23/2022   Blood clotting disorder 10/28/2022   Neck pain 10/27/2022   Acute thoracic back pain 10/27/2022   Lumbar radiculopathy 05/26/2022   Degeneration of lumbar intervertebral disc 05/26/2022   Major depressive disorder    Severe alcohol use disorder    Generalized anxiety disorder    Obstructive sleep apnea    Hepatic steatosis 04/22/2022   Osteoarthritis of carpometacarpal (CMC) joint of thumb 04/20/2022   Acquired trigger finger of right ring finger 04/20/2022   Bilateral wrist pain  04/20/2022   Chalazion of left lower eyelid 04/15/2022   Bipolar 1 disorder (HCC)    Cervical radiculopathy 10/14/2021   Pain in joint of right hip 07/03/2020   Rib pain 07/03/2020   Recurrent syncope    Orthostasis    Lactic acidosis 12/18/2019   Hematemesis 12/18/2019   Pain in right knee 12/14/2018   PAF (paroxysmal atrial fibrillation) 10/09/2018   Dorsalgia 09/22/2018   Dysphagia 09/22/2018   GERD without esophagitis 09/22/2018   Irritable bowel syndrome without diarrhea 09/22/2018   Osteoarthritis of knee 09/22/2018   Polypharmacy 09/22/2018   Sciatica of right side 09/22/2018   Vitamin D  deficiency 09/22/2018   Mixed hyperlipidemia 05/07/2016   Chronic atrial fibrillation (HCC) 09/08/2015   Coronary artery disease involving native coronary artery with angina pectoris 03/24/2015   Essential hypertension 03/03/2015   Chest pain 02/11/2015   Non-ST elevation MI (NSTEMI) (HCC) 07/26/2008    PCP: Benton Gave, PA  REFERRING PROVIDER: Lauraine Hamlet, FNP  REFERRING DIAG: left shoulder and low back pain (patient on provider's notes confirm that it is the right shoulder)   THERAPY DIAG:  Other symptoms and signs involving the musculoskeletal system  Other abnormalities of gait and mobility  Muscle weakness (generalized)  Chronic pain of both knees  Difficulty in walking, not elsewhere classified  Rationale for Evaluation and Treatment: Rehabilitation  ONSET DATE: 2 years ago (shoulder) (Back): Fall several years ago  SUBJECTIVE:                                                                                                                                                                                      SUBJECTIVE STATEMENT: Patient reports today is an okay day.   EVAL: Pt reports she hurts all over, stating the onset of her right shoulder pain occurred 2 years ago from an accident. She does not want to undergo surgery, even though this has been  recommended. Reports her back pain is due to falling several years ago. 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 She wants to improve her pain so she can continue to work her garden. No red flag symptoms.   Hand dominance: Right  PERTINENT HISTORY: See extensive PMH above   PAIN:  Are you having pain? Yes: NPRS scale: 2 Pain location: multi-joint pain-- mostly Lt hip and knee Pain description: sharp, dull, ache  Aggravating factors: standing for hours, stairs, reaching, walking Relieving factors: Pressure point therapy , hard ball, vibrating bed   PRECAUTIONS: Fall  WEIGHT BEARING RESTRICTIONS: No  FALLS:  Has patient fallen in last 6 months? Yes. Number of falls 2x cannot see very well and was in a hurry   LIVING ENVIRONMENT: Lives with: lives alone Lives in: House/apartment Stairs: Yes: Internal: 3 stories steps; can reach both Has following equipment at home: None  OCCUPATION: Employee at [employer]   PLOF: Independent  PATIENT GOALS: Capacity to maintain my garden and keep my house  NEXT MD VISIT: TBD  OBJECTIVE:  Note: Objective measures were completed at Evaluation unless otherwise noted. PATIENT SURVEYS :  PSFS: THE PATIENT SPECIFIC FUNCTIONAL SCALE  Place score of 0-10 (0 = unable to perform activity and 10 = able to perform activity at the same level as before injury or problem)  Activity Date: 06/28/24 07/31/24   Able to kneel  0 2   2.  Walk into grocery store without LBP 0 6   3.  Reach for dish in cabinet  0 2        Total Score 0 10     Total Score = Sum of activity scores/number of activities  Minimally Detectable Change: 3 points (for single activity); 2 points (for average score)  [facility] (nd). The Patient Specific Functional Scale . Retrieved from [email or web address]  Cannot kneel down  COGNITION: Overall cognitive status: Within  functional limits for tasks assessed     POSTURE: Forward head posture  Lumbar ROM: Limited 75% extension pain Flexion WFL pain Left flexion 25% limited pain Right flexion 25% limited pain  UPPER EXTREMITY ROM:  Active ROM Right eval Left eval Right 07/17/24  Shoulder flexion 50 active 120 passive WFL  162 degrees AROM  Shoulder extension [facility]   Shoulder abduction 55 deg active 128 passive 105 deg    Shoulder adduction     Shoulder internal rotation [facility] WFL   Shoulder external rotation Limited 50% WFL   Elbow flexion     Elbow extension     Wrist flexion     Wrist extension     Wrist ulnar deviation     Wrist radial deviation     Wrist pronation     Wrist supination     (Blank rows = not tested)  UPPER EXTREMITY MMT: MMT Right eval Left eval  Shoulder flexion 3-   Shoulder extension 3+   Shoulder abduction 3-   Shoulder adduction    Shoulder internal rotation 4-   Shoulder external rotation 3+   Middle trapezius    Lower trapezius    Elbow flexion    Elbow extension    Wrist flexion    Wrist extension    Wrist ulnar deviation    Wrist radial deviation    Wrist pronation    Wrist supination    Grip strength (lbs)    (Blank rows = not tested)  LOWER EXTREMITY MMT:   MMT Right eval Left eval  Hip flexion 4 4  Hip extension    Hip abduction  Hip adduction    Hip internal rotation    Hip external rotation    Knee flexion 5 4- pain   Knee extension 4 4  Ankle dorsiflexion    Ankle plantarflexion    Ankle inversion    Ankle eversion     (Blank rows = not tested)   Functional testing: Tandem stance: 8 sec before balance loss Single limb stance bilaterally: unable  Sit to stands 19.11 sec    [facility] Adult PT Treatment:                                                DATE: 08/03/2024 Neuromuscular re-ed: Side lying: Shoulder ER holding --> red therabar (Rt), 1#DB (Lt) Shoulder abd --> red therabar (Rt), 1#DB (Lt) Shoulder flexion to 90  degrees --> no weight (Rt), 1#DB (Lt) Shoulder horizontal abduction from 90 degrees flexion --> no weight (Rt), 1#DB (Lt) Therapeutic Activity: Shoulder flexion wall slide with stretch at end range Flexion wall slides + 1#DB Scaption slides + 1#DB Pec corner stretch Self Care: Community resources for help with home   [facility] Adult PT Treatment:                                                DATE: 07/31/2024 Therapeutic Exercise: NuStep level 5 x 6 minutes UE/LE for endurance Standing calf raise 2 x 10  Standing HS curl 2 x 10  Neuromuscular re-ed: Seated:  Marching for core activation --> repeated sitting on dyno disc Dead bug sitting on dyno disc Hip add ball squeeze 10x5 sec Knee extension + hip add ball squeeze x10 (alt) Side lying 2 x 10 (modified from standing d/t knee pain pin Rt SLS) Clamshells + red TB 2x10   [facility] Adult PT Treatment:                                                DATE: 07/20/24 Therapeutic Exercise: NuStep level 5 x 6 minutes UE/LE for endurance Standing calf raise 2 x 10  Knee flexion ROM on step x 10; bilateral Standing HS curl 2 x 10  HEP update  Neuromuscular re-ed: Seated march with core activation 2 x 10  Standing hip abduction 2 x 10  Standing hip extension 2 x 10 Therapeutic Activity: Sit to stand 3 x 5  Step taps 6 inch 2 x 10             PATIENT EDUCATION: Education details: HEP update Person educated: Patient Education method: Explanation, Demonstration, Tactile cues, and Verbal cues, handout Education comprehension: verbalized understanding, returned demonstration, verbal cues required, tactile cues required, and needs further education  HOME EXERCISE PROGRAM: Access Code: [email or web address] URL: [email or web address] Date: 08/03/2024 Prepared by: Lamarr Price  Exercises - Seated Scapular Retraction  - 1 x daily - 7 x weekly - 3 sets - 10 reps - 3-5 sec hold - Standing Single Arm Shoulder Flexion Stretch on Wall  - 1 x  daily - 7 x weekly - 2 sets - 10 reps - Shoulder External Rotation and Scapular Retraction  - 1 x daily -  7 x weekly - 3 sets - 10 reps - 3-5 sec hold - Supine Active Straight Leg Raise  - 1 x daily - 7 x weekly - 2 sets - 10 reps - Supine Hip Adduction Isometric with Ball  - 1 x daily - 7 x weekly - 2 sets - 10 reps - Seated Long Arc Quad  - 1 x daily - 7 x weekly - 2 sets - 10 reps - Standing Knee Flexion Stretch on Step  - 1 x daily - 7 x weekly - 1 sets - 10 reps - 5 sec  hold - Standing Hip Abduction with Counter Support  - 1 x daily - 7 x weekly - 2 sets - 10 reps - Standing Hip Extension with Counter Support  - 1 x daily - 7 x weekly - 2 sets - 10 reps - Alternating Step Taps with Counter Support  - 1 x daily - 7 x weekly - 2 sets - 10 reps - Standing Knee Flexion  - 1 x daily - 7 x weekly - 2 sets - 10 reps - Sidelying Shoulder Abduction Palm Forward  - 1 x daily - 7 x weekly - 3 sets - 10 reps - Sidelying Shoulder Flexion 15 Degrees  - 1 x daily - 7 x weekly - 3 sets - 10 reps - Corner Pec Major Stretch  - 1 x daily - 7 x weekly - 1 sets - 3 reps - 10-30 sec hold   ASSESSMENT:  CLINICAL IMPRESSION: Session focused on shoulder strengthening and mobility exercises. Tactile cues improved scapula stability (Rt>Lt) during side lying exercises; noted elevated shoulder compensation on Rt with shoulder abduction. Patient challenged with added resistance in scaption plane with wall slides; Rt UE fatigued quickly.   EVAL: Patient is a 69 y.o. female who was seen today for physical therapy evaluation and treatment for Right shoulder pain and low back pain, though patient endorses multi-joint pain that is severely limiting her overall functional capacity. Upon assessment pt severely limited right shoulder ROM, however passively close to [facility]. Pt unable to hold tandem stance longer than 5 sec, unable to complete single limb stance bilaterally. She also has lower extremity and upper extremity strength  deficits. Pt loves working at her job, and wants to get better so she can work with out pain, upkeep her house, and continue treating her garden. Pt will benefit from skilled therapy to address the deficits below.    OBJECTIVE IMPAIRMENTS: Abnormal gait, cardiopulmonary status limiting activity, decreased activity tolerance, decreased balance, decreased cognition, decreased coordination, decreased endurance, decreased mobility, difficulty walking, decreased ROM, decreased strength, increased fascial restrictions, increased muscle spasms, impaired flexibility, impaired UE functional use, improper body mechanics, postural dysfunction, and pain.   GOALS: Goals reviewed with patient? Yes  SHORT TERM GOALS: Target date: 07/27/2024  Pt will be efficient with initial HEP so she has increased independence for everyday activities.  Baseline: no time to issue  Goal status: MET  2.  Pt will improve PSFS score by at least 3 points to improve overall endurance to walk from car to grocery store and back with less than or =5/10 reports of pain  Baseline: 0 07/31/24: 10 Goal status: MET  3.  Pt will be able to reach for objects at chest height with right shoulder so she improves functional capacity for painting and reaching for items at grocery store Baseline: unable 07/31/24: below shoulder height Goal status: NOT MET  4. Patient will complete 5 x  STS in </= 15 seconds to improve functional strength.   Baseline: see above 07/31/24: 24 sec   Goal status: NOT MET   LONG TERM GOALS: Target date: 08/25/24  Pt will be efficient with advanced HEP so she has increased independence for everyday activities.  Baseline:  Goal status: INITIAL  2.  Pt will maintain tandem stance  longer than 10 sec without UE support so she has improved balance and is a decreased fall risk Baseline: Unable Goal status: INITIAL  3.  Pt will perform kneeling to the floor from standing using light UE support so she improves  functional capacity for gardening and household chores Baseline: unable Goal status: INITIAL    PLAN: PT FREQUENCY: 1-2x/week  PT DURATION: 8 weeks  PLANNED INTERVENTIONS: [id number]- PT Re-evaluation, 97750- Physical Performance Testing, 97110-Therapeutic exercises, 97530- Therapeutic activity, [id number]- Neuromuscular re-education, 97535- Self Care, [id number]- Manual therapy, [id number]- Gait training, [phone]- Aquatic Therapy, [phone] (1-2 muscles), [id number] (3+ muscles)- Dry Needling, Patient/Family education, Balance training, Stair training, Cryotherapy, and Moist heat  PLAN FOR NEXT SESSION: review and progress HEP prn; shoulder ISO, shoulder mobility; general strengthening. Community resources with house help?  Lamarr Price, PTA 08/03/2024 11:58 AM  "

## 2024-08-07 ENCOUNTER — Ambulatory Visit: Admitting: Rehabilitative and Restorative Service Providers"

## 2024-08-07 ENCOUNTER — Encounter: Payer: Self-pay | Admitting: Rehabilitative and Restorative Service Providers"

## 2024-08-07 DIAGNOSIS — R29898 Other symptoms and signs involving the musculoskeletal system: Secondary | ICD-10-CM | POA: Diagnosis not present

## 2024-08-07 DIAGNOSIS — R2689 Other abnormalities of gait and mobility: Secondary | ICD-10-CM

## 2024-08-07 DIAGNOSIS — M6281 Muscle weakness (generalized): Secondary | ICD-10-CM

## 2024-08-07 NOTE — Therapy (Signed)
 " OUTPATIENT PHYSICAL THERAPY TREATMENT  Patient Name: Lindsay [name] MRN: [id number] DOB:1956-05-31, 69 y.o., female Today's Date: 08/07/2024  END OF SESSION:  PT End of Session - 08/07/24 1405     Visit Number 8    Number of Visits 17    Date for Recertification  08/25/24    Authorization Type Medicare Part A and B    Progress Note Due on Visit 10    PT Start Time 1406    PT Stop Time 1446    PT Time Calculation (min) 40 min    Activity Tolerance Patient tolerated treatment well    Behavior During Therapy [facility] for tasks assessed/performed          Past Medical History:  Diagnosis Date   Abnormal mammogram    Thick Tissue   Acquired trigger finger of right ring finger 04/20/2022   Arthritis    Bilateral wrist pain 04/20/2022   Bipolar 1 disorder    Cataract    OU   Cervical radiculopathy 10/14/2021   Chalazion of left lower eyelid 04/15/2022   Chest pain 02/11/2015   Chronic atrial fibrillation 09/08/2015   Colon polyp    Compressed cervical disc    Coronary artery disease involving native coronary artery with angina pectoris 03/24/2015   Dorsalgia 09/22/2018   Dysphagia 09/22/2018   Essential hypertension 03/03/2015   Generalized anxiety disorder    GERD without esophagitis 09/22/2018   Hematemesis 12/18/2019   Hepatic steatosis 04/22/2022   History of appendectomy 1963   Hypertensive retinopathy    OU   Irritable bowel syndrome without diarrhea 09/22/2018   Lactic acidosis 12/18/2019   LLQ pain 04/15/2022   Major depressive disorder    Migraine without status migrainosus, not intractable 09/22/2018   Mixed hyperlipidemia 05/07/2016   Non-ST elevation MI (NSTEMI) 07/26/2008   No cardiac catheterization at the time.   Obstructive sleep apnea    no CPAP use   Orthostasis    Osteoarthritis of carpometacarpal (CMC) joint of thumb 04/20/2022   Osteoarthritis of knee 09/22/2018   PAF (paroxysmal atrial fibrillation) 10/09/2018   Pain in joint of right hip  07/03/2020   Pain in right knee 12/14/2018   Recurrent syncope    Rib pain 07/03/2020   Sciatica of right side 09/22/2018   Severe alcohol use disorder    Vitamin D  deficiency    Past Surgical History:  Procedure Laterality Date   APPENDECTOMY     CARDIAC CATHETERIZATION N/A 03/25/2015   Procedure: Left Heart Cath and Coronary Angiography;  Surgeon: Alm LELON Clay, MD;  Location: [facility] INVASIVE CV LAB;  Service: Cardiovascular;  Laterality: N/A;   CORONARY STENT INTERVENTION N/A 10/09/2018   Procedure: CORONARY STENT INTERVENTION;  Surgeon: Dann Candyce RAMAN, MD;  Location: [facility] INVASIVE CV LAB;  Service: Cardiovascular;  Laterality: N/A;   DILATATION & CURETTAGE/HYSTEROSCOPY WITH MYOSURE N/A 05/19/2016   Procedure: DILATATION & CURETTAGE/HYSTEROSCOPY WITH MYOSURE;  Surgeon: Hargis Paradise, MD;  Location: [facility];  Service: Gynecology;  Laterality: N/A;  Possible Myosure for polyps.   EYE SURGERY     LEFT HEART CATH AND CORONARY ANGIOGRAPHY N/A 10/09/2018   Procedure: LEFT HEART CATH AND CORONARY ANGIOGRAPHY;  Surgeon: Dann Candyce RAMAN, MD;  Location: [facility] INVASIVE CV LAB;  Service: Cardiovascular;  Laterality: N/A;   MOUTH SURGERY     URETHRAL DILATION     Patient Active Problem List   Diagnosis Date Noted   B12 deficiency 10/24/2023   Seasonal allergies 10/21/2023   Gastroesophageal reflux  disease without esophagitis 10/21/2023   Epigastric pain 10/21/2023   Stress 02/02/2023   Insomnia 12/23/2022   Blood clotting disorder 10/28/2022   Neck pain 10/27/2022   Acute thoracic back pain 10/27/2022   Lumbar radiculopathy 05/26/2022   Degeneration of lumbar intervertebral disc 05/26/2022   Major depressive disorder    Severe alcohol use disorder    Generalized anxiety disorder    Obstructive sleep apnea    Hepatic steatosis 04/22/2022   Osteoarthritis of carpometacarpal (CMC) joint of thumb 04/20/2022   Acquired trigger finger of right ring finger 04/20/2022   Bilateral wrist pain  04/20/2022   Chalazion of left lower eyelid 04/15/2022   Bipolar 1 disorder (HCC)    Cervical radiculopathy 10/14/2021   Pain in joint of right hip 07/03/2020   Rib pain 07/03/2020   Recurrent syncope    Orthostasis    Lactic acidosis 12/18/2019   Hematemesis 12/18/2019   Pain in right knee 12/14/2018   PAF (paroxysmal atrial fibrillation) 10/09/2018   Dorsalgia 09/22/2018   Dysphagia 09/22/2018   GERD without esophagitis 09/22/2018   Irritable bowel syndrome without diarrhea 09/22/2018   Osteoarthritis of knee 09/22/2018   Polypharmacy 09/22/2018   Sciatica of right side 09/22/2018   Vitamin D  deficiency 09/22/2018   Mixed hyperlipidemia 05/07/2016   Chronic atrial fibrillation (HCC) 09/08/2015   Coronary artery disease involving native coronary artery with angina pectoris 03/24/2015   Essential hypertension 03/03/2015   Chest pain 02/11/2015   Non-ST elevation MI (NSTEMI) (HCC) 07/26/2008    PCP: Benton Gave, PA REFERRING PROVIDER: Lauraine Hamlet, FNP REFERRING DIAG: left shoulder and low back pain (patient on provider's notes confirm that it is the right shoulder)   THERAPY DIAG:  Other symptoms and signs involving the musculoskeletal system  Other abnormalities of gait and mobility  Muscle weakness (generalized)  Rationale for Evaluation and Treatment: Rehabilitation  ONSET DATE: 2 years ago (shoulder) (Back): Fall several years ago  SUBJECTIVE:                                                                                                                                                                                      SUBJECTIVE STATEMENT: Patient reports she is sore today -- she did a lot of chores around the house yesterday. She made a pie for the first time in 5 years. She reports she is reaching into cabinets with the R UE. She reports numbness in the L thumb and the 4th and 5th (which have improved). She feels L hand grip is declining.   EVAL: Pt  reports she hurts all over, stating the onset of her right shoulder pain occurred 2 years  ago from an accident. She does not want to undergo surgery, even though this has been recommended. Reports her back pain is due to falling several years ago. 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 She wants to improve her pain so she can continue to work her garden. No red flag symptoms.  Hand dominance: Right  PERTINENT HISTORY: See extensive PMH above   PAIN:  Are you having pain? Yes: NPRS scale: 2 Pain location: multi-joint pain-- mostly Lt hip and knee Pain description: sharp, dull, ache  Aggravating factors: standing for hours, stairs, reaching, walking Relieving factors: Pressure point therapy , hard ball, vibrating bed   PRECAUTIONS: Fall  WEIGHT BEARING RESTRICTIONS: No  FALLS:  Has patient fallen in last 6 months? Yes. Number of falls 2x cannot see very well and was in a hurry   LIVING ENVIRONMENT: Lives with: lives alone Lives in: House/apartment Stairs: Yes: Internal: 3 stories steps; can reach both Has following equipment at home: None  OCCUPATION: Employee at [employer]   PLOF: Independent  PATIENT GOALS: Capacity to maintain my garden and keep my house  NEXT MD VISIT: TBD  OBJECTIVE:  Note: Objective measures were completed at Evaluation unless otherwise noted. PATIENT SURVEYS :  PSFS: THE PATIENT SPECIFIC FUNCTIONAL SCALE  Place score of 0-10 (0 = unable to perform activity and 10 = able to perform activity at the same level as before injury or problem)  Activity Date: 06/28/24 07/31/24   Able to kneel  0 2   2.  Walk into grocery store without LBP 0 6   3.  Reach for dish in cabinet  0 2        Total Score 0 10     Total Score = Sum of activity scores/number of activities  Minimally Detectable Change: 3 points (for single activity); 2 points (for average score)  [facility] (nd). The Patient Specific  Functional Scale . Retrieved from [email or web address]  Cannot kneel down  COGNITION: Overall cognitive status: Within functional limits for tasks assessed     POSTURE: Forward head posture  Lumbar ROM: Limited 75% extension pain Flexion WFL pain Left flexion 25% limited pain Right flexion 25% limited pain  UPPER EXTREMITY ROM:  Active ROM Right eval Left eval Right 07/17/24  Shoulder flexion 50 active 120 passive WFL  162 degrees AROM  Shoulder extension [facility]   Shoulder abduction 55 deg active 128 passive 105 deg    Shoulder adduction     Shoulder internal rotation [facility] WFL   Shoulder external rotation Limited 50% WFL   Elbow flexion     Elbow extension     Wrist flexion     Wrist extension     Wrist ulnar deviation     Wrist radial deviation     Wrist pronation     Wrist supination     (Blank rows = not tested)  UPPER EXTREMITY MMT: MMT Right eval Left eval  Shoulder flexion 3-   Shoulder extension 3+   Shoulder abduction 3-   Shoulder adduction    Shoulder internal rotation 4-   Shoulder external rotation 3+   Middle trapezius    Lower trapezius    Elbow flexion    Elbow extension    Wrist flexion    Wrist extension    Wrist ulnar deviation    Wrist radial deviation    Wrist pronation    Wrist supination  Grip strength (lbs)    (Blank rows = not tested)  LOWER EXTREMITY MMT:   MMT Right eval Left eval  Hip flexion 4 4  Hip extension    Hip abduction    Hip adduction    Hip internal rotation    Hip external rotation    Knee flexion 5 4- pain   Knee extension 4 4  Ankle dorsiflexion    Ankle plantarflexion    Ankle inversion    Ankle eversion     (Blank rows = not tested)   Functional testing: Tandem stance: 8 sec before balance loss Single limb stance bilaterally: unable  Sit to stands 19.11 sec   [facility] Adult PT Treatment:                                                DATE:  08/07/24  Therapeutic Activity: Seated  Shoulder ER Ls x 12 reps Sidelying ER with red therabar R ER with 1# weight  L Shoulder flexion at wall with stretch at end range Sit<>stand x 10 reps Heel raises standing Self Care: We discussed housing resources In [facility] with handouts provided We discussed other resources for seniors including [facility]  [facility] Adult PT Treatment:                                                DATE: 08/03/2024 Neuromuscular re-ed: Side lying: Shoulder ER holding --> red therabar (Rt), 1#DB (Lt) Shoulder abd --> red therabar (Rt), 1#DB (Lt) Shoulder flexion to 90 degrees --> no weight (Rt), 1#DB (Lt) Shoulder horizontal abduction from 90 degrees flexion --> no weight (Rt), 1#DB (Lt) Therapeutic Activity: Shoulder flexion wall slide with stretch at end range Flexion wall slides + 1#DB Scaption slides + 1#DB Pec corner stretch Self Care: Community resources for help with home   [facility] Adult PT Treatment:                                                DATE: 07/31/2024 Therapeutic Exercise: NuStep level 5 x 6 minutes UE/LE for endurance Standing calf raise 2 x 10  Standing HS curl 2 x 10  Neuromuscular re-ed: Seated:  Marching for core activation --> repeated sitting on dyno disc Dead bug sitting on dyno disc Hip add ball squeeze 10x5 sec Knee extension + hip add ball squeeze x10 (alt) Side lying 2 x 10 (modified from standing d/t knee pain pin Rt SLS) Clamshells + red TB 2x10   [facility] Adult PT Treatment:                                                DATE: 07/20/24 Therapeutic Exercise: NuStep level 5 x 6 minutes UE/LE for endurance Standing calf raise 2 x 10  Knee flexion ROM on step x 10; bilateral Standing HS curl 2 x 10  HEP update  Neuromuscular re-ed: Seated march with  core activation 2 x 10  Standing hip abduction 2 x 10  Standing hip extension 2 x 10 Therapeutic  Activity: Sit to stand 3 x 5  Step taps 6 inch 2 x 10             PATIENT EDUCATION: Education details: HEP update Person educated: Patient Education method: Explanation, Demonstration, Tactile cues, and Verbal cues, handout Education comprehension: verbalized understanding, returned demonstration, verbal cues required, tactile cues required, and needs further education  HOME EXERCISE PROGRAM: Access Code: [email or web address] URL: [email or web address] Date: 08/03/2024 Prepared by: Lamarr Price  Exercises - Seated Scapular Retraction  - 1 x daily - 7 x weekly - 3 sets - 10 reps - 3-5 sec hold - Standing Single Arm Shoulder Flexion Stretch on Wall  - 1 x daily - 7 x weekly - 2 sets - 10 reps - Shoulder External Rotation and Scapular Retraction  - 1 x daily - 7 x weekly - 3 sets - 10 reps - 3-5 sec hold - Supine Active Straight Leg Raise  - 1 x daily - 7 x weekly - 2 sets - 10 reps - Supine Hip Adduction Isometric with Ball  - 1 x daily - 7 x weekly - 2 sets - 10 reps - Seated Long Arc Quad  - 1 x daily - 7 x weekly - 2 sets - 10 reps - Standing Knee Flexion Stretch on Step  - 1 x daily - 7 x weekly - 1 sets - 10 reps - 5 sec  hold - Standing Hip Abduction with Counter Support  - 1 x daily - 7 x weekly - 2 sets - 10 reps - Standing Hip Extension with Counter Support  - 1 x daily - 7 x weekly - 2 sets - 10 reps - Alternating Step Taps with Counter Support  - 1 x daily - 7 x weekly - 2 sets - 10 reps - Standing Knee Flexion  - 1 x daily - 7 x weekly - 2 sets - 10 reps - Sidelying Shoulder Abduction Palm Forward  - 1 x daily - 7 x weekly - 3 sets - 10 reps - Sidelying Shoulder Flexion 15 Degrees  - 1 x daily - 7 x weekly - 3 sets - 10 reps - Corner Pec Major Stretch  - 1 x daily - 7 x weekly - 1 sets - 3 reps - 10-30 sec hold  ASSESSMENT:  CLINICAL IMPRESSION: The patient was sore due to chores yesterday, but improves throughout session. She has discussed home repair needs  including no heat, issues with septic tank, and issues with wall mold. PT provided community resources for [facility] via find help to give contacts. Also provided council on aging local contact for resources.  EVAL: Patient is a 69 y.o. female who was seen today for physical therapy evaluation and treatment for Right shoulder pain and low back pain, though patient endorses multi-joint pain that is severely limiting her overall functional capacity. Upon assessment pt severely limited right shoulder ROM, however passively close to [facility]. Pt unable to hold tandem stance longer than 5 sec, unable to complete single limb stance bilaterally. She also has lower extremity and upper extremity strength deficits. Pt loves working at her job, and wants to get better so she can work with out pain, upkeep her house, and continue treating her garden. Pt will benefit from skilled therapy to address the deficits below.    OBJECTIVE IMPAIRMENTS: Abnormal gait, cardiopulmonary status limiting activity, decreased  activity tolerance, decreased balance, decreased cognition, decreased coordination, decreased endurance, decreased mobility, difficulty walking, decreased ROM, decreased strength, increased fascial restrictions, increased muscle spasms, impaired flexibility, impaired UE functional use, improper body mechanics, postural dysfunction, and pain.   GOALS: Goals reviewed with patient? Yes  SHORT TERM GOALS: Target date: 07/27/2024  Pt will be efficient with initial HEP so she has increased independence for everyday activities.  Baseline: no time to issue  Goal status: MET  2.  Pt will improve PSFS score by at least 3 points to improve overall endurance to walk from car to grocery store and back with less than or =5/10 reports of pain  Baseline: 0 07/31/24: 10 Goal status: MET  3.  Pt will be able to reach for objects at chest height with right shoulder so she improves functional capacity for painting and reaching  for items at grocery store Baseline: unable 07/31/24: below shoulder height Goal status: NOT MET  4. Patient will complete 5 x STS in </= 15 seconds to improve functional strength.   Baseline: see above 07/31/24: 24 sec   Goal status: NOT MET   LONG TERM GOALS: Target date: 08/25/24  Pt will be efficient with advanced HEP so she has increased independence for everyday activities.  Baseline:  Goal status: INITIAL  2.  Pt will maintain tandem stance  longer than 10 sec without UE support so she has improved balance and is a decreased fall risk Baseline: Unable Goal status: INITIAL  3.  Pt will perform kneeling to the floor from standing using light UE support so she improves functional capacity for gardening and household chores Baseline: unable Goal status: INITIAL  PLAN: PT FREQUENCY: 1-2x/week  PT DURATION: 8 weeks  PLANNED INTERVENTIONS: [id number]- PT Re-evaluation, 97750- Physical Performance Testing, 97110-Therapeutic exercises, 97530- Therapeutic activity, [id number]- Neuromuscular re-education, 97535- Self Care, [id number]- Manual therapy, [id number]- Gait training, [phone]- Aquatic Therapy, [phone] (1-2 muscles), [id number] (3+ muscles)- Dry Needling, Patient/Family education, Balance training, Stair training, Cryotherapy, and Moist heat  PLAN FOR NEXT SESSION: review and progress HEP prn; shoulder ISO, shoulder mobility; general strengthening. Community resources with house help-- PROVIDED 08/07/24  Lindsay [name], PT 08/07/2024 2:06 PM  "

## 2024-08-10 ENCOUNTER — Encounter: Payer: Self-pay | Admitting: Rehabilitative and Restorative Service Providers"

## 2024-08-10 ENCOUNTER — Ambulatory Visit: Admitting: Rehabilitative and Restorative Service Providers"

## 2024-08-10 ENCOUNTER — Other Ambulatory Visit: Payer: Self-pay | Admitting: Urgent Care

## 2024-08-10 DIAGNOSIS — M6281 Muscle weakness (generalized): Secondary | ICD-10-CM

## 2024-08-10 DIAGNOSIS — I10 Essential (primary) hypertension: Secondary | ICD-10-CM

## 2024-08-10 DIAGNOSIS — R29898 Other symptoms and signs involving the musculoskeletal system: Secondary | ICD-10-CM | POA: Diagnosis not present

## 2024-08-10 DIAGNOSIS — R2689 Other abnormalities of gait and mobility: Secondary | ICD-10-CM

## 2024-08-10 NOTE — Therapy (Signed)
 " OUTPATIENT PHYSICAL THERAPY TREATMENT  Patient Name: Lindsay [name] MRN: [id number] DOB:[birth year], 69 y.o., female Today's Date: 08/10/2024  END OF SESSION:  PT End of Session - 08/10/24 1017     Visit Number 9    Number of Visits 17    Date for Recertification  08/25/24    Authorization Type Medicare Part A and B    Progress Note Due on Visit 10    PT Start Time 1018    PT Stop Time 1056    PT Time Calculation (min) 38 min    Activity Tolerance Patient tolerated treatment well    Behavior During Therapy [facility] for tasks assessed/performed          Past Medical History:  Diagnosis Date   Abnormal mammogram    Thick Tissue   Acquired trigger finger of right ring finger 04/20/2022   Arthritis    Bilateral wrist pain 04/20/2022   Bipolar 1 disorder    Cataract    OU   Cervical radiculopathy 10/14/2021   Chalazion of left lower eyelid 04/15/2022   Chest pain 02/11/2015   Chronic atrial fibrillation 09/08/2015   Colon polyp    Compressed cervical disc    Coronary artery disease involving native coronary artery with angina pectoris 03/24/2015   Dorsalgia 09/22/2018   Dysphagia 09/22/2018   Essential hypertension 03/03/2015   Generalized anxiety disorder    GERD without esophagitis 09/22/2018   Hematemesis 12/18/2019   Hepatic steatosis 04/22/2022   History of appendectomy 1963   Hypertensive retinopathy    OU   Irritable bowel syndrome without diarrhea 09/22/2018   Lactic acidosis 12/18/2019   LLQ pain 04/15/2022   Major depressive disorder    Migraine without status migrainosus, not intractable 09/22/2018   Mixed hyperlipidemia 05/07/2016   Non-ST elevation MI (NSTEMI) 07/26/2008   No cardiac catheterization at the time.   Obstructive sleep apnea    no CPAP use   Orthostasis    Osteoarthritis of carpometacarpal (CMC) joint of thumb 04/20/2022   Osteoarthritis of knee 09/22/2018   PAF (paroxysmal atrial fibrillation) 10/09/2018   Pain in joint of right hip  07/03/2020   Pain in right knee 12/14/2018   Recurrent syncope    Rib pain 07/03/2020   Sciatica of right side 09/22/2018   Severe alcohol use disorder    Vitamin D  deficiency    Past Surgical History:  Procedure Laterality Date   APPENDECTOMY     CARDIAC CATHETERIZATION N/A 03/25/2015   Procedure: Left Heart Cath and Coronary Angiography;  Surgeon: Alm LELON Clay, MD;  Location: [facility] INVASIVE CV LAB;  Service: Cardiovascular;  Laterality: N/A;   CORONARY STENT INTERVENTION N/A 10/09/2018   Procedure: CORONARY STENT INTERVENTION;  Surgeon: Dann Candyce RAMAN, MD;  Location: [facility] INVASIVE CV LAB;  Service: Cardiovascular;  Laterality: N/A;   DILATATION & CURETTAGE/HYSTEROSCOPY WITH MYOSURE N/A 05/19/2016   Procedure: DILATATION & CURETTAGE/HYSTEROSCOPY WITH MYOSURE;  Surgeon: Hargis Paradise, MD;  Location: [facility];  Service: Gynecology;  Laterality: N/A;  Possible Myosure for polyps.   EYE SURGERY     LEFT HEART CATH AND CORONARY ANGIOGRAPHY N/A 10/09/2018   Procedure: LEFT HEART CATH AND CORONARY ANGIOGRAPHY;  Surgeon: Dann Candyce RAMAN, MD;  Location: [facility] INVASIVE CV LAB;  Service: Cardiovascular;  Laterality: N/A;   MOUTH SURGERY     URETHRAL DILATION     Patient Active Problem List   Diagnosis Date Noted   B12 deficiency 10/24/2023   Seasonal allergies 10/21/2023   Gastroesophageal reflux  disease without esophagitis 10/21/2023   Epigastric pain 10/21/2023   Stress 02/02/2023   Insomnia 12/23/2022   Blood clotting disorder 10/28/2022   Neck pain 10/27/2022   Acute thoracic back pain 10/27/2022   Lumbar radiculopathy 05/26/2022   Degeneration of lumbar intervertebral disc 05/26/2022   Major depressive disorder    Severe alcohol use disorder    Generalized anxiety disorder    Obstructive sleep apnea    Hepatic steatosis 04/22/2022   Osteoarthritis of carpometacarpal (CMC) joint of thumb 04/20/2022   Acquired trigger finger of right ring finger 04/20/2022   Bilateral wrist pain  04/20/2022   Chalazion of left lower eyelid 04/15/2022   Bipolar 1 disorder (HCC)    Cervical radiculopathy 10/14/2021   Pain in joint of right hip 07/03/2020   Rib pain 07/03/2020   Recurrent syncope    Orthostasis    Lactic acidosis 12/18/2019   Hematemesis 12/18/2019   Pain in right knee 12/14/2018   PAF (paroxysmal atrial fibrillation) 10/09/2018   Dorsalgia 09/22/2018   Dysphagia 09/22/2018   GERD without esophagitis 09/22/2018   Irritable bowel syndrome without diarrhea 09/22/2018   Osteoarthritis of knee 09/22/2018   Polypharmacy 09/22/2018   Sciatica of right side 09/22/2018   Vitamin D  deficiency 09/22/2018   Mixed hyperlipidemia 05/07/2016   Chronic atrial fibrillation (HCC) 09/08/2015   Coronary artery disease involving native coronary artery with angina pectoris 03/24/2015   Essential hypertension 03/03/2015   Chest pain 02/11/2015   Non-ST elevation MI (NSTEMI) (HCC) 07/26/2008    PCP: Benton Gave, PA REFERRING PROVIDER: Lauraine Hamlet, FNP REFERRING DIAG: left shoulder and low back pain (patient on provider's notes confirm that it is the right shoulder)   THERAPY DIAG:  Other symptoms and signs involving the musculoskeletal system  Other abnormalities of gait and mobility  Muscle weakness (generalized)  Rationale for Evaluation and Treatment: Rehabilitation  ONSET DATE: 2 years ago (shoulder) (Back): Fall several years ago  SUBJECTIVE:                                                                                                                                                                                      SUBJECTIVE STATEMENT: The patient reports she felt better after therapy last visit.  She is noticing some pain and tightness in her knees. She is able to donn socks/pants in standing demonstrating functional progress.   EVAL: Pt reports she hurts all over, stating the onset of her right shoulder pain occurred 2 years ago from an accident.  She does not want to undergo surgery, even though this has been recommended. Reports her back pain is due to falling several years ago. 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 She wants to improve her pain so she can continue to work her garden. No red flag symptoms.  Hand dominance: Right  PERTINENT HISTORY: See extensive PMH above   PAIN:  Are you having pain? Yes: NPRS scale: 2 Pain location: multi-joint pain-- mostly Lt hip and knee Pain description: sharp, dull, ache  Aggravating factors: standing for hours, stairs, reaching, walking Relieving factors: Pressure point therapy , hard ball, vibrating bed   PRECAUTIONS: Fall  WEIGHT BEARING RESTRICTIONS: No  FALLS:  Has patient fallen in last 6 months? Yes. Number of falls 2x cannot see very well and was in a hurry   LIVING ENVIRONMENT: Lives with: lives alone Lives in: House/apartment Stairs: Yes: Internal: 3 stories steps; can reach both Has following equipment at home: None  OCCUPATION: Employee at [employer]   PLOF: Independent  PATIENT GOALS: Capacity to maintain my garden and keep my house  NEXT MD VISIT: TBD  OBJECTIVE:  Note: Objective measures were completed at Evaluation unless otherwise noted. PATIENT SURVEYS :  PSFS: THE PATIENT SPECIFIC FUNCTIONAL SCALE  Place score of 0-10 (0 = unable to perform activity and 10 = able to perform activity at the same level as before injury or problem)  Activity Date: 06/28/24 07/31/24   Able to kneel  0 2   2.  Walk into grocery store without LBP 0 6   3.  Reach for dish in cabinet  0 2        Total Score 0 10     Total Score = Sum of activity scores/number of activities  Minimally Detectable Change: 3 points (for single activity); 2 points (for average score)  [facility] (nd). The Patient Specific Functional Scale . Retrieved from [email or web address]  Cannot kneel  down  COGNITION: Overall cognitive status: Within functional limits for tasks assessed     POSTURE: Forward head posture  Lumbar ROM: Limited 75% extension pain Flexion WFL pain Left flexion 25% limited pain Right flexion 25% limited pain  UPPER EXTREMITY ROM:  Active ROM Right eval Left eval Right 07/17/24  Shoulder flexion 50 active 120 passive WFL  162 degrees AROM  Shoulder extension [facility]   Shoulder abduction 55 deg active 128 passive 105 deg    Shoulder adduction     Shoulder internal rotation [facility] WFL   Shoulder external rotation Limited 50% WFL   Elbow flexion     Elbow extension     Wrist flexion     Wrist extension     Wrist ulnar deviation     Wrist radial deviation     Wrist pronation     Wrist supination     (Blank rows = not tested)  UPPER EXTREMITY MMT: MMT Right eval Left eval  Shoulder flexion 3-   Shoulder extension 3+   Shoulder abduction 3-   Shoulder adduction    Shoulder internal rotation 4-   Shoulder external rotation 3+   Middle trapezius    Lower trapezius    Elbow flexion    Elbow extension    Wrist flexion    Wrist extension    Wrist ulnar deviation    Wrist radial deviation    Wrist pronation    Wrist supination    Grip strength (lbs)    (Blank rows = not tested)  LOWER EXTREMITY MMT:   MMT Right eval Left eval  Hip flexion 4  4  Hip extension    Hip abduction    Hip adduction    Hip internal rotation    Hip external rotation    Knee flexion 5 4- pain   Knee extension 4 4  Ankle dorsiflexion    Ankle plantarflexion    Ankle inversion    Ankle eversion     (Blank rows = not tested)   Functional testing: Tandem stance: 8 sec before balance loss Single limb stance bilaterally: unable  Sit to stands 19.11 sec   [facility] Adult PT Treatment:                                                DATE: 08/10/24 Therapeutic Exercise: Standing  AAROM at door with towel glides into shoulder flexion end range Added moving  into stride position to bring gastroc stretch into movement Chest  opening W and Y x 20 seconds bilat x 3 reps Supine SLR x 10 reps Incline table shoulder flexion x 10 reps-- unable to tolerate 1# weight for resistance Therapeutic Activity: Shoulder circles (small) CW and CCW x 30 seconds each direction at 90 deg flexion Supine horizontal abudction with 1# weight x 6 reps Supine protraction/retraction 1# R and L x 10 reps Sidelying R shoulder abduction x 5 reps with end range stretch to tolerance Standing scaption without weights-- attempted to add yellow bands, but unable to tolerate resistance Single leg standing x 3-5 seconds x 3 reps each side working on visual cuing and balance control   [facility] Adult PT Treatment:                                                DATE: 08/07/24  Therapeutic Activity: Seated  Shoulder ER Ls x 12 reps Sidelying ER with red therabar R ER with 1# weight  L Shoulder flexion at wall with stretch at end range Sit<>stand x 10 reps Heel raises standing Self Care: We discussed housing resources In [facility] with handouts provided We discussed other resources for seniors including [facility]  [facility] Adult PT Treatment:                                                DATE: 08/03/2024 Neuromuscular re-ed: Side lying: Shoulder ER holding --> red therabar (Rt), 1#DB (Lt) Shoulder abd --> red therabar (Rt), 1#DB (Lt) Shoulder flexion to 90 degrees --> no weight (Rt), 1#DB (Lt) Shoulder horizontal abduction from 90 degrees flexion --> no weight (Rt), 1#DB (Lt) Therapeutic Activity: Shoulder flexion wall slide with stretch at end range Flexion wall slides + 1#DB Scaption slides + 1#DB Pec corner stretch Self Care: Community resources for help with home           PATIENT EDUCATION: Education details: HEP update Person educated: Patient Education method: Programmer, Multimedia, Facilities Manager, Actor cues, and  Verbal cues, handout Education comprehension: verbalized understanding, returned demonstration, verbal cues required, tactile cues required, and needs further education  HOME EXERCISE PROGRAM: Access Code: [email or web address] URL: [email or web address] Date: 08/10/2024 Prepared by: Tawni Ferrier  Exercises -  Seated Scapular Retraction  - 1 x daily - 7 x weekly - 3 sets - 10 reps - 3-5 sec hold - Standing Single Arm Shoulder Flexion Stretch on Wall  - 1 x daily - 7 x weekly - 2 sets - 10 reps - Shoulder External Rotation and Scapular Retraction  - 1 x daily - 7 x weekly - 3 sets - 10 reps - 3-5 sec hold - Supine Active Straight Leg Raise  - 1 x daily - 7 x weekly - 2 sets - 10 reps - Supine Hip Adduction Isometric with Ball  - 1 x daily - 7 x weekly - 2 sets - 10 reps - Seated Long Arc Quad  - 1 x daily - 7 x weekly - 2 sets - 10 reps - Standing Knee Flexion Stretch on Step  - 1 x daily - 7 x weekly - 1 sets - 10 reps - 5 sec  hold - Standing Hip Abduction with Counter Support  - 1 x daily - 7 x weekly - 2 sets - 10 reps - Standing Hip Extension with Counter Support  - 1 x daily - 7 x weekly - 2 sets - 10 reps - Alternating Step Taps with Counter Support  - 1 x daily - 7 x weekly - 2 sets - 10 reps - Standing Knee Flexion  - 1 x daily - 7 x weekly - 2 sets - 10 reps - Sidelying Shoulder Abduction Full Range of Motion  - 1 x daily - 7 x weekly - 5 reps - 10-15 seconds hold - Sidelying Shoulder Flexion 15 Degrees  - 1 x daily - 7 x weekly - 3 sets - 10 reps - Corner Pec Major Stretch  - 1 x daily - 7 x weekly - 1 sets - 3 reps - 10-30 sec hold  ASSESSMENT:  CLINICAL IMPRESSION: The patient is improving with ROM, but is unable to tolerate resistance activities on the R UE. PT continuing to progress within tolerable range of motion. She is incorporating R UE ROM into daily tasks of reaching into cabinets. PT to continue to progress functional strength as she is able to tolerate  it.  EVAL: Patient is a 69 y.o. female who was seen today for physical therapy evaluation and treatment for Right shoulder pain and low back pain, though patient endorses multi-joint pain that is severely limiting her overall functional capacity. Upon assessment pt severely limited right shoulder ROM, however passively close to [facility]. Pt unable to hold tandem stance longer than 5 sec, unable to complete single limb stance bilaterally. She also has lower extremity and upper extremity strength deficits. Pt loves working at her job, and wants to get better so she can work with out pain, upkeep her house, and continue treating her garden. Pt will benefit from skilled therapy to address the deficits below.    OBJECTIVE IMPAIRMENTS: Abnormal gait, cardiopulmonary status limiting activity, decreased activity tolerance, decreased balance, decreased cognition, decreased coordination, decreased endurance, decreased mobility, difficulty walking, decreased ROM, decreased strength, increased fascial restrictions, increased muscle spasms, impaired flexibility, impaired UE functional use, improper body mechanics, postural dysfunction, and pain.   GOALS: Goals reviewed with patient? Yes  SHORT TERM GOALS: Target date: 07/27/2024  Pt will be efficient with initial HEP so she has increased independence for everyday activities.  Baseline: no time to issue  Goal status: MET  2.  Pt will improve PSFS score by at least 3 points to improve overall endurance to walk  from car to grocery store and back with less than or =5/10 reports of pain  Baseline: 0 07/31/24: 10 Goal status: MET  3.  Pt will be able to reach for objects at chest height with right shoulder so she improves functional capacity for painting and reaching for items at grocery store Baseline: unable 07/31/24: below shoulder height Goal status: NOT MET  4. Patient will complete 5 x STS in </= 15 seconds to improve functional strength.   Baseline: see  above 07/31/24: 24 sec   Goal status: NOT MET  LONG TERM GOALS: Target date: 08/25/24  Pt will be efficient with advanced HEP so she has increased independence for everyday activities.  Baseline:  Goal status: INITIAL  2.  Pt will maintain tandem stance  longer than 10 sec without UE support so she has improved balance and is a decreased fall risk Baseline: Unable Goal status: INITIAL  3.  Pt will perform kneeling to the floor from standing using light UE support so she improves functional capacity for gardening and household chores Baseline: unable Goal status: INITIAL  PLAN: PT FREQUENCY: 1-2x/week  PT DURATION: 8 weeks  PLANNED INTERVENTIONS: [id number]- PT Re-evaluation, 97750- Physical Performance Testing, 97110-Therapeutic exercises, 97530- Therapeutic activity, [id number]- Neuromuscular re-education, 97535- Self Care, [id number]- Manual therapy, [id number]- Gait training, [phone]- Aquatic Therapy, [phone] (1-2 muscles), [id number] (3+ muscles)- Dry Needling, Patient/Family education, Balance training, Stair training, Cryotherapy, and Moist heat  PLAN FOR NEXT SESSION: review and progress HEP prn; shoulder ISO, shoulder mobility; general strengthening. Community resources with house help-- PROVIDED 08/07/24  Tashiba Timoney, PT 08/10/24 10:51 AM  "

## 2024-08-14 ENCOUNTER — Encounter: Payer: Self-pay | Admitting: Rehabilitative and Restorative Service Providers"

## 2024-08-14 ENCOUNTER — Ambulatory Visit: Admitting: Rehabilitative and Restorative Service Providers"

## 2024-08-14 DIAGNOSIS — R29898 Other symptoms and signs involving the musculoskeletal system: Secondary | ICD-10-CM | POA: Diagnosis not present

## 2024-08-14 DIAGNOSIS — M6281 Muscle weakness (generalized): Secondary | ICD-10-CM

## 2024-08-14 DIAGNOSIS — R2689 Other abnormalities of gait and mobility: Secondary | ICD-10-CM

## 2024-08-14 NOTE — Therapy (Signed)
 " OUTPATIENT PHYSICAL THERAPY TREATMENT AND 10TH VISIT PROGRESS NOTE  Patient Name: Lindsay [name] MRN: [id number] DOB:[birth year], 69 y.o., female Today's Date: 08/14/2024  Physical Therapy Progress Note   Dates of Reporting Period:06/28/2024-08/14/2024   Objective Measurements: See ROM measurements and goals  Reason Skilled Services are Required: Patient continues with overhead weakness-- hard to lift a hanger with coat down from closet or put a dish away with R UE. She has improved AROM and PT now emphasizing strength training. Also focused on functional strength for core and Les to improve ADLs and IADLs.  Thank you for the referral of this patient.  END OF SESSION:  PT End of Session - 08/14/24 1407     Visit Number 10    Number of Visits 17    Date for Recertification  08/25/24    Authorization Type Medicare Part A and B    Progress Note Due on Visit 20    PT Start Time 1406    PT Stop Time 1446    PT Time Calculation (min) 40 min    Activity Tolerance Patient tolerated treatment well    Behavior During Therapy [facility] for tasks assessed/performed           Past Medical History:  Diagnosis Date   Abnormal mammogram    Thick Tissue   Acquired trigger finger of right ring finger 04/20/2022   Arthritis    Bilateral wrist pain 04/20/2022   Bipolar 1 disorder    Cataract    OU   Cervical radiculopathy 10/14/2021   Chalazion of left lower eyelid 04/15/2022   Chest pain 02/11/2015   Chronic atrial fibrillation 09/08/2015   Colon polyp    Compressed cervical disc    Coronary artery disease involving native coronary artery with angina pectoris 03/24/2015   Dorsalgia 09/22/2018   Dysphagia 09/22/2018   Essential hypertension 03/03/2015   Generalized anxiety disorder    GERD without esophagitis 09/22/2018   Hematemesis 12/18/2019   Hepatic steatosis 04/22/2022   History of appendectomy 1963   Hypertensive retinopathy    OU   Irritable bowel syndrome without diarrhea  09/22/2018   Lactic acidosis 12/18/2019   LLQ pain 04/15/2022   Major depressive disorder    Migraine without status migrainosus, not intractable 09/22/2018   Mixed hyperlipidemia 05/07/2016   Non-ST elevation MI (NSTEMI) 07/26/2008   No cardiac catheterization at the time.   Obstructive sleep apnea    no CPAP use   Orthostasis    Osteoarthritis of carpometacarpal (CMC) joint of thumb 04/20/2022   Osteoarthritis of knee 09/22/2018   PAF (paroxysmal atrial fibrillation) 10/09/2018   Pain in joint of right hip 07/03/2020   Pain in right knee 12/14/2018   Recurrent syncope    Rib pain 07/03/2020   Sciatica of right side 09/22/2018   Severe alcohol use disorder    Vitamin D  deficiency    Past Surgical History:  Procedure Laterality Date   APPENDECTOMY     CARDIAC CATHETERIZATION N/A 03/25/2015   Procedure: Left Heart Cath and Coronary Angiography;  Surgeon: Alm LELON Clay, MD;  [facility] INVASIVE CV LAB;  Service: Cardiovascular;  Laterality: N/A;   CORONARY STENT INTERVENTION N/A 10/09/2018   Procedure: CORONARY STENT INTERVENTION;  Surgeon: Dann Candyce RAMAN, MD;  Location: [facility] INVASIVE CV LAB;  Service: Cardiovascular;  Laterality: N/A;   DILATATION & CURETTAGE/HYSTEROSCOPY WITH MYOSURE N/A 05/19/2016   Procedure: DILATATION & CURETTAGE/HYSTEROSCOPY WITH MYOSURE;  Surgeon: Hargis Paradise, MD;  Location: [facility];  Service: Gynecology;  Laterality: N/A;  Possible Myosure for polyps.   EYE SURGERY     LEFT HEART CATH AND CORONARY ANGIOGRAPHY N/A 10/09/2018   Procedure: LEFT HEART CATH AND CORONARY ANGIOGRAPHY;  Surgeon: Dann Candyce RAMAN, MD;  Location: [facility] INVASIVE CV LAB;  Service: Cardiovascular;  Laterality: N/A;   MOUTH SURGERY     URETHRAL DILATION     Patient Active Problem List   Diagnosis Date Noted   B12 deficiency 10/24/2023   Seasonal allergies 10/21/2023   Gastroesophageal reflux disease without esophagitis 10/21/2023   Epigastric pain 10/21/2023   Stress  02/02/2023   Insomnia 12/23/2022   Blood clotting disorder 10/28/2022   Neck pain 10/27/2022   Acute thoracic back pain 10/27/2022   Lumbar radiculopathy 05/26/2022   Degeneration of lumbar intervertebral disc 05/26/2022   Major depressive disorder    Severe alcohol use disorder    Generalized anxiety disorder    Obstructive sleep apnea    Hepatic steatosis 04/22/2022   Osteoarthritis of carpometacarpal (CMC) joint of thumb 04/20/2022   Acquired trigger finger of right ring finger 04/20/2022   Bilateral wrist pain 04/20/2022   Chalazion of left lower eyelid 04/15/2022   Bipolar 1 disorder (HCC)    Cervical radiculopathy 10/14/2021   Pain in joint of right hip 07/03/2020   Rib pain 07/03/2020   Recurrent syncope    Orthostasis    Lactic acidosis 12/18/2019   Hematemesis 12/18/2019   Pain in right knee 12/14/2018   PAF (paroxysmal atrial fibrillation) 10/09/2018   Dorsalgia 09/22/2018   Dysphagia 09/22/2018   GERD without esophagitis 09/22/2018   Irritable bowel syndrome without diarrhea 09/22/2018   Osteoarthritis of knee 09/22/2018   Polypharmacy 09/22/2018   Sciatica of right side 09/22/2018   Vitamin D  deficiency 09/22/2018   Mixed hyperlipidemia 05/07/2016   Chronic atrial fibrillation (HCC) 09/08/2015   Coronary artery disease involving native coronary artery with angina pectoris 03/24/2015   Essential hypertension 03/03/2015   Chest pain 02/11/2015   Non-ST elevation MI (NSTEMI) (HCC) 07/26/2008    PCP: Benton Gave, PA REFERRING PROVIDER: Lauraine Hamlet, FNP REFERRING DIAG: left shoulder and low back pain (patient on provider's notes confirm that it is the right shoulder)   THERAPY DIAG:  Other symptoms and signs involving the musculoskeletal system  Other abnormalities of gait and mobility  Muscle weakness (generalized)  Rationale for Evaluation and Treatment: Rehabilitation  ONSET DATE: 2 years ago (shoulder) (Back): Fall several years  ago  SUBJECTIVE:  SUBJECTIVE STATEMENT: The patient reports she is feeling improvement. She is reaching overhead to practice with the R shoulder. She is having less pain today. She is also reporting some tightness in her knees which limit mobility at times.   EVAL: Pt reports she hurts all over, stating the onset of her right shoulder pain occurred 2 years ago from an accident. She does not want to undergo surgery, even though this has been recommended. Reports her back pain is due to falling several years ago. 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 She wants to improve her pain so she can continue to work her garden. No red flag symptoms.  Hand dominance: Right  PERTINENT HISTORY: See extensive PMH above   PAIN:  Are you having pain? Yes: NPRS scale: better today Pain location: multi-joint pain-- mostly Lt hip and knee Pain description: sharp, dull, ache  Aggravating factors: standing for hours, stairs, reaching, walking Relieving factors: Pressure point therapy , hard ball, vibrating bed   PRECAUTIONS: Fall  WEIGHT BEARING RESTRICTIONS: No  FALLS:  Has patient fallen in last 6 months? Yes. Number of falls 2x cannot see very well and was in a hurry   LIVING ENVIRONMENT: Lives with: lives alone Lives in: House/apartment Stairs: Yes: Internal: 3 stories steps; can reach both Has following equipment at home: None  OCCUPATION: Employee at [employer]   PLOF: Independent  PATIENT GOALS: Capacity to maintain my garden and keep my house  OBJECTIVE:  Note: Objective measures were completed at Evaluation unless otherwise noted. PATIENT SURVEYS :  PSFS: THE PATIENT SPECIFIC FUNCTIONAL SCALE  Place score of 0-10 (0 = unable to perform activity and 10 = able to  perform activity at the same level as before injury or problem)  Activity Date: 06/28/24 07/31/24   Able to kneel  0 2   2.  Walk into grocery store without LBP 0 6   3.  Reach for dish in cabinet  0 2        Total Score 0 10   Total Score = Sum of activity scores/number of activities  Minimally Detectable Change: 3 points (for single activity); 2 points (for average score)  [facility] (nd). The Patient Specific Functional Scale . Retrieved from [email or web address]  Cannot kneel down  COGNITION: Overall cognitive status: Within functional limits for tasks assessed     POSTURE: Forward head posture  Lumbar ROM: Limited 75% extension pain Flexion WFL pain Left flexion 25% limited pain Right flexion 25% limited pain  UPPER EXTREMITY ROM:  Active ROM Right eval Left eval Right 07/17/24  Shoulder flexion 50 active 120 passive WFL  162 degrees AROM  Shoulder extension [facility]   Shoulder abduction 55 deg active 128 passive 105 deg    Shoulder adduction     Shoulder internal rotation [facility] WFL   Shoulder external rotation Limited 50% WFL   Elbow flexion     Elbow extension     Wrist flexion     Wrist extension     Wrist ulnar deviation     Wrist radial deviation     Wrist pronation     Wrist supination     (Blank rows = not tested)  UPPER EXTREMITY MMT: MMT Right eval Left eval  Shoulder flexion 3-   Shoulder extension 3+   Shoulder abduction 3-   Shoulder adduction    Shoulder internal rotation 4-   Shoulder external  rotation 3+   Middle trapezius    Lower trapezius    Elbow flexion    Elbow extension    Wrist flexion    Wrist extension    Wrist ulnar deviation    Wrist radial deviation    Wrist pronation    Wrist supination    Grip strength (lbs)    (Blank rows = not tested)  LOWER EXTREMITY MMT:   MMT Right eval Left eval  Hip flexion 4 4  Hip extension    Hip abduction    Hip  adduction    Hip internal rotation    Hip external rotation    Knee flexion 5 4- pain   Knee extension 4 4  Ankle dorsiflexion    Ankle plantarflexion    Ankle inversion    Ankle eversion     (Blank rows = not tested)   Functional testing: Tandem stance: 8 sec before balance loss Single limb stance bilaterally: unable  Sit to stands 19.11 sec   [facility] Adult PT Treatment:                                                DATE: 08/14/24 Therapeutic Exercise: Standing Heel cord stretch Sidelying Hip abduction x 5 reps Supine Heel slides Dead bug x 5 reps Neuromuscular re-ed: Single leg standing tasks reducing UE support Therapeutic Activity: Incline shoulder flexion x 8 reps, then added 1# x 8 reps  CW and CCW shoulder circles x seconds with 1#  Chest press 1# x 10 reps with incline mat at 30-35 degrees Kneeling initially on a mat table with R then L Les and Ues (climbing into quadriped position) Patient tolerated quadriped on mat, therefore progressed to kneeling on foam pat with bilat UE support on mat moving into 1/2 kneeling to return to stand mod indep with mat surface ot press into (after demo for technique)    [facility] Adult PT Treatment:                                                DATE: 08/10/24 Therapeutic Exercise: Standing  AAROM at door with towel glides into shoulder flexion end range Added moving into stride position to bring gastroc stretch into movement Chest  opening W and Y x 20 seconds bilat x 3 reps Supine SLR x 10 reps Incline table shoulder flexion x 10 reps-- unable to tolerate 1# weight for resistance Therapeutic Activity: Shoulder circles (small) CW and CCW x 30 seconds each direction at 90 deg flexion Supine horizontal abudction with 1# weight x 6 reps Supine protraction/retraction 1# R and L x 10 reps Sidelying R shoulder abduction x 5 reps with end range stretch to tolerance Standing scaption without weights-- attempted to add yellow bands, but  unable to tolerate resistance Single leg standing x 3-5 seconds x 3 reps each side working on visual cuing and balance control   [facility] Adult PT Treatment:                                                DATE: 08/07/24  Therapeutic  Activity: Seated  Shoulder ER Ls x 12 reps Sidelying ER with red therabar R ER with 1# weight  L Shoulder flexion at wall with stretch at end range Sit<>stand x 10 reps Heel raises standing Self Care: We discussed housing resources In [facility] with handouts provided We discussed other resources for seniors including [facility]  PATIENT EDUCATION: Education details: HEP update Person educated: Patient Education method: Programmer, Multimedia, Facilities Manager, Actor cues, and Verbal cues, handout Education comprehension: verbalized understanding, returned demonstration, verbal cues required, tactile cues required, and needs further education  HOME EXERCISE PROGRAM: Access Code: [email or web address] URL: [email or web address] Date: 08/14/2024 Prepared by: Tawni Ferrier  Exercises - Standing Single Arm Shoulder Flexion Stretch on Wall  - 1 x daily - 7 x weekly - 2 sets - 10 reps - Shoulder External Rotation and Scapular Retraction  - 1 x daily - 7 x weekly - 3 sets - 10 reps - 3-5 sec hold - Corner Pec Major Stretch  - 1 x daily - 7 x weekly - 1 sets - 3 reps - 10-30 sec hold - Gastroc Stretch on Wall  - 1 x daily - 7 x weekly - 1 sets - 2 reps - 30 seconds hold - Sidelying Hip Abduction  - 2 x daily - 7 x weekly - 1 sets - 10 reps - Sidelying Shoulder Abduction Full Range of Motion  - 1 x daily - 7 x weekly - 5 reps - 10-15 seconds hold - Supine Active Straight Leg Raise  - 1 x daily - 7 x weekly - 2 sets - 10 reps - Standing Hip Abduction with Counter Support  - 1 x daily - 7 x weekly - 2 sets - 10 reps - Standing Hip Extension with Counter Support  - 1 x daily - 7 x weekly - 2 sets - 10 reps -  Single Leg Stance with Support  - 2 x daily - 7 x weekly - 1 sets - 10 reps  ASSESSMENT:  CLINICAL IMPRESSION: The patient and PT continue to progress. With UE, added in clinic incline strengthening to work towards functional tasks of reaching for items in closet or lowering a dish from an overhead cabinet. For Les/back, we began working towards LTG of kneeling. Patient would like to work towards gardening in spring and summer if she can improve mobility. PT to continue to progress functional strength as she is able to tolerate-- plan to renew on 1/31 with goals for continued UE strengthening (will MMT), functional use of R UE, and return to gardening goals x 4 more weeks.   EVAL: Patient is a 68 y.o. female who was seen today for physical therapy evaluation and treatment for Right shoulder pain and low back pain, though patient endorses multi-joint pain that is severely limiting her overall functional capacity. Upon assessment pt severely limited right shoulder ROM, however passively close to [facility]. Pt unable to hold tandem stance longer than 5 sec, unable to complete single limb stance bilaterally. She also has lower extremity and upper extremity strength deficits. Pt loves working at her job, and wants to get better so she can work with out pain, upkeep her house, and continue treating her garden. Pt will benefit from skilled therapy to address the deficits below.   OBJECTIVE IMPAIRMENTS: Abnormal gait, cardiopulmonary status limiting activity, decreased activity tolerance, decreased balance, decreased cognition, decreased coordination, decreased endurance, decreased mobility, difficulty walking, decreased ROM, decreased strength, increased fascial restrictions,  increased muscle spasms, impaired flexibility, impaired UE functional use, improper body mechanics, postural dysfunction, and pain.   GOALS: Goals reviewed with patient? Yes  SHORT TERM GOALS: Target date: 07/27/2024  Pt will be efficient  with initial HEP so she has increased independence for everyday activities.  Baseline: no time to issue  Goal status: MET  2.  Pt will improve PSFS score by at least 3 points to improve overall endurance to walk from car to grocery store and back with less than or =5/10 reports of pain  Baseline: 0 07/31/24: 10 Goal status: MET  3.  Pt will be able to reach for objects at chest height with right shoulder so she improves functional capacity for painting and reaching for items at grocery store Baseline: unable 07/31/24: below shoulder height Goal status: NOT MET  4. Patient will complete 5 x STS in </= 15 seconds to improve functional strength.   Baseline: see above 07/31/24: 24 sec   Goal status: NOT MET  LONG TERM GOALS: Target date: 08/25/24  Pt will be efficient with advanced HEP so she has increased independence for everyday activities.  Baseline:  Goal status: INITIAL  2.  Pt will maintain tandem stance  longer than 10 sec without UE support so she has improved balance and is a decreased fall risk Baseline: Unable Goal status: INITIAL  3.  Pt will perform kneeling to the floor from standing using light UE support so she improves functional capacity for gardening and household chores Baseline: unable Goal status: INITIAL  PLAN: PT FREQUENCY: 1-2x/week  PT DURATION: 8 weeks  PLANNED INTERVENTIONS: [id number]- PT Re-evaluation, 97750- Physical Performance Testing, 97110-Therapeutic exercises, 97530- Therapeutic activity, [id number]- Neuromuscular re-education, 97535- Self Care, [id number]- Manual therapy, [id number]- Gait training, [id number]- Aquatic Therapy, [phone] (1-2 muscles), [id number] (3+ muscles)- Dry Needling, Patient/Family education, Balance training, Stair training, Cryotherapy, and Moist heat  PLAN FOR NEXT SESSION: review and progress HEP prn; shoulder incline strengthening; general strengthening. Work towards reaching into overhead cabinets, lowering plates/dishes from overhead level, kneeling for  gardening tasks. Plan to renew x 4 more weeks by 1/31 LTG date.   Ludella Pranger, PT 08/14/24 2:07 PM  "

## 2024-08-17 ENCOUNTER — Ambulatory Visit

## 2024-08-17 DIAGNOSIS — R262 Difficulty in walking, not elsewhere classified: Secondary | ICD-10-CM

## 2024-08-17 DIAGNOSIS — G8929 Other chronic pain: Secondary | ICD-10-CM

## 2024-08-17 DIAGNOSIS — R2689 Other abnormalities of gait and mobility: Secondary | ICD-10-CM

## 2024-08-17 DIAGNOSIS — R29898 Other symptoms and signs involving the musculoskeletal system: Secondary | ICD-10-CM

## 2024-08-17 DIAGNOSIS — M6281 Muscle weakness (generalized): Secondary | ICD-10-CM

## 2024-08-17 NOTE — Therapy (Signed)
 " OUTPATIENT PHYSICAL THERAPY TREATMENT  Patient Name: Lindsay [name] MRN: [id number] DOB:29-Jul-1955, 69 y.o., female Today's Date: 08/17/2024   END OF SESSION:  PT End of Session - 08/17/24 1018     Visit Number 11    Number of Visits 17    Date for Recertification  08/25/24    Authorization Type Medicare Part A and B    Progress Note Due on Visit 20    PT Start Time 1018    PT Stop Time 1103    PT Time Calculation (min) 45 min    Activity Tolerance Patient tolerated treatment well    Behavior During Therapy [facility] for tasks assessed/performed           Past Medical History:  Diagnosis Date   Abnormal mammogram    Thick Tissue   Acquired trigger finger of right ring finger 04/20/2022   Arthritis    Bilateral wrist pain 04/20/2022   Bipolar 1 disorder    Cataract    OU   Cervical radiculopathy 10/14/2021   Chalazion of left lower eyelid 04/15/2022   Chest pain 02/11/2015   Chronic atrial fibrillation 09/08/2015   Colon polyp    Compressed cervical disc    Coronary artery disease involving native coronary artery with angina pectoris 03/24/2015   Dorsalgia 09/22/2018   Dysphagia 09/22/2018   Essential hypertension 03/03/2015   Generalized anxiety disorder    GERD without esophagitis 09/22/2018   Hematemesis 12/18/2019   Hepatic steatosis 04/22/2022   History of appendectomy 1963   Hypertensive retinopathy    OU   Irritable bowel syndrome without diarrhea 09/22/2018   Lactic acidosis 12/18/2019   LLQ pain 04/15/2022   Major depressive disorder    Migraine without status migrainosus, not intractable 09/22/2018   Mixed hyperlipidemia 05/07/2016   Non-ST elevation MI (NSTEMI) 07/26/2008   No cardiac catheterization at the time.   Obstructive sleep apnea    no CPAP use   Orthostasis    Osteoarthritis of carpometacarpal (CMC) joint of thumb 04/20/2022   Osteoarthritis of knee 09/22/2018   PAF (paroxysmal atrial fibrillation) 10/09/2018   Pain in joint of right hip  07/03/2020   Pain in right knee 12/14/2018   Recurrent syncope    Rib pain 07/03/2020   Sciatica of right side 09/22/2018   Severe alcohol use disorder    Vitamin D  deficiency    Past Surgical History:  Procedure Laterality Date   APPENDECTOMY     CARDIAC CATHETERIZATION N/A 03/25/2015   Procedure: Left Heart Cath and Coronary Angiography;  Surgeon: Alm LELON Clay, MD;  Location: [facility] INVASIVE CV LAB;  Service: Cardiovascular;  Laterality: N/A;   CORONARY STENT INTERVENTION N/A 10/09/2018   Procedure: CORONARY STENT INTERVENTION;  Surgeon: Dann Candyce RAMAN, MD;  Location: [facility] INVASIVE CV LAB;  Service: Cardiovascular;  Laterality: N/A;   DILATATION & CURETTAGE/HYSTEROSCOPY WITH MYOSURE N/A 05/19/2016   Procedure: DILATATION & CURETTAGE/HYSTEROSCOPY WITH MYOSURE;  Surgeon: Hargis Paradise, MD;  Location: [facility];  Service: Gynecology;  Laterality: N/A;  Possible Myosure for polyps.   EYE SURGERY     LEFT HEART CATH AND CORONARY ANGIOGRAPHY N/A 10/09/2018   Procedure: LEFT HEART CATH AND CORONARY ANGIOGRAPHY;  Surgeon: Dann Candyce RAMAN, MD;  Location: [facility] INVASIVE CV LAB;  Service: Cardiovascular;  Laterality: N/A;   MOUTH SURGERY     URETHRAL DILATION     Patient Active Problem List   Diagnosis Date Noted   B12 deficiency 10/24/2023   Seasonal allergies 10/21/2023  Gastroesophageal reflux disease without esophagitis 10/21/2023   Epigastric pain 10/21/2023   Stress 02/02/2023   Insomnia 12/23/2022   Blood clotting disorder 10/28/2022   Neck pain 10/27/2022   Acute thoracic back pain 10/27/2022   Lumbar radiculopathy 05/26/2022   Degeneration of lumbar intervertebral disc 05/26/2022   Major depressive disorder    Severe alcohol use disorder    Generalized anxiety disorder    Obstructive sleep apnea    Hepatic steatosis 04/22/2022   Osteoarthritis of carpometacarpal (CMC) joint of thumb 04/20/2022   Acquired trigger finger of right ring finger 04/20/2022   Bilateral wrist pain  04/20/2022   Chalazion of left lower eyelid 04/15/2022   Bipolar 1 disorder (HCC)    Cervical radiculopathy 10/14/2021   Pain in joint of right hip 07/03/2020   Rib pain 07/03/2020   Recurrent syncope    Orthostasis    Lactic acidosis 12/18/2019   Hematemesis 12/18/2019   Pain in right knee 12/14/2018   PAF (paroxysmal atrial fibrillation) 10/09/2018   Dorsalgia 09/22/2018   Dysphagia 09/22/2018   GERD without esophagitis 09/22/2018   Irritable bowel syndrome without diarrhea 09/22/2018   Osteoarthritis of knee 09/22/2018   Polypharmacy 09/22/2018   Sciatica of right side 09/22/2018   Vitamin D  deficiency 09/22/2018   Mixed hyperlipidemia 05/07/2016   Chronic atrial fibrillation (HCC) 09/08/2015   Coronary artery disease involving native coronary artery with angina pectoris 03/24/2015   Essential hypertension 03/03/2015   Chest pain 02/11/2015   Non-ST elevation MI (NSTEMI) (HCC) 07/26/2008    PCP: Benton Gave, PA REFERRING PROVIDER: Lauraine Hamlet, FNP REFERRING DIAG: left shoulder and low back pain (patient on provider's notes confirm that it is the right shoulder)   THERAPY DIAG:  Other symptoms and signs involving the musculoskeletal system  Other abnormalities of gait and mobility  Muscle weakness (generalized)  Chronic pain of both knees  Difficulty in walking, not elsewhere classified  Rationale for Evaluation and Treatment: Rehabilitation  ONSET DATE: 2 years ago (shoulder) (Back): Fall several years ago  SUBJECTIVE:                                                                                                                                                                                      SUBJECTIVE STATEMENT: Patient reports she has not done any exercises today; states she is feeling stiff in shoulders and knees. States she is feeling much better with the exercises.   EVAL: Pt reports she hurts all over, stating the onset of her right shoulder  pain occurred 2 years ago from an accident. She does not want to undergo surgery, even though this has been recommended. Reports her  back pain is due to falling several years ago. 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 She wants to improve her pain so she can continue to work her garden. No red flag symptoms.  Hand dominance: Right  PERTINENT HISTORY: See extensive PMH above   PAIN:  Are you having pain? Yes: NPRS scale: better today Pain location: multi-joint pain-- mostly Lt hip and knee Pain description: sharp, dull, ache  Aggravating factors: standing for hours, stairs, reaching, walking Relieving factors: Pressure point therapy , hard ball, vibrating bed   PRECAUTIONS: Fall  WEIGHT BEARING RESTRICTIONS: No  FALLS:  Has patient fallen in last 6 months? Yes. Number of falls 2x cannot see very well and was in a hurry   LIVING ENVIRONMENT: Lives with: lives alone Lives in: House/apartment Stairs: Yes: Internal: 3 stories steps; can reach both Has following equipment at home: None  OCCUPATION: Employee at [employer]   PLOF: Independent  PATIENT GOALS: Capacity to maintain my garden and keep my house  OBJECTIVE:  Note: Objective measures were completed at Evaluation unless otherwise noted. PATIENT SURVEYS :  PSFS: THE PATIENT SPECIFIC FUNCTIONAL SCALE  Place score of 0-10 (0 = unable to perform activity and 10 = able to perform activity at the same level as before injury or problem)  Activity Date: 06/28/24 07/31/24   Able to kneel  0 2   2.  Walk into grocery store without LBP 0 6   3.  Reach for dish in cabinet  0 2        Total Score 0 10   Total Score = Sum of activity scores/number of activities  Minimally Detectable Change: 3 points (for single activity); 2 points (for average score)  [facility] (nd). The Patient Specific Functional Scale . Retrieved from  [email or web address]  Cannot kneel down  COGNITION: Overall cognitive status: Within functional limits for tasks assessed     POSTURE: Forward head posture  Lumbar ROM: Limited 75% extension pain Flexion WFL pain Left flexion 25% limited pain Right flexion 25% limited pain  UPPER EXTREMITY ROM:  Active ROM Right eval Left eval Right 07/17/24  Shoulder flexion 50 active 120 passive WFL  162 degrees AROM  Shoulder extension [facility]   Shoulder abduction 55 deg active 128 passive 105 deg    Shoulder adduction     Shoulder internal rotation [facility] WFL   Shoulder external rotation Limited 50% WFL   Elbow flexion     Elbow extension     Wrist flexion     Wrist extension     Wrist ulnar deviation     Wrist radial deviation     Wrist pronation     Wrist supination     (Blank rows = not tested)  UPPER EXTREMITY MMT: MMT Right eval Left eval  Shoulder flexion 3-   Shoulder extension 3+   Shoulder abduction 3-   Shoulder adduction    Shoulder internal rotation 4-   Shoulder external rotation 3+   Middle trapezius    Lower trapezius    Elbow flexion    Elbow extension    Wrist flexion    Wrist extension    Wrist ulnar deviation    Wrist radial deviation    Wrist pronation    Wrist supination    Grip strength (lbs)    (Blank rows = not tested)  LOWER EXTREMITY MMT:   MMT Right eval Left eval  Hip flexion 4 4  Hip extension    Hip abduction    Hip adduction    Hip internal rotation    Hip external rotation    Knee flexion 5 4- pain   Knee extension 4 4  Ankle dorsiflexion    Ankle plantarflexion    Ankle inversion    Ankle eversion     (Blank rows = not tested)   Functional testing: Tandem stance: 8 sec before balance loss Single limb stance bilaterally: unable  Sit to stands 19.11 sec    [facility] Adult PT Treatment:                                                DATE: 08/17/2024 Therapeutic  Exercise: Standing gastroc/soleus stretches Pec stretch in doorway --> 90 degrees position felt best 3 x 30 sec Passive pec stretch on towel roll --> W arms; discontinued d/t Rt shoulder pain Neuromuscular re-ed: Seated straight leg raise SA wall slides without shoulder elevation Side lying shoulder ER + 1#DB 10 x 3 sec Therapeutic Activity: Shoulder flexion wall slides with 1#DB 2x10 Scap clock Self Care: Discussion on emergency plan with upcoming winter storm    [facility] Adult PT Treatment:                                                DATE: 08/14/24 Therapeutic Exercise: Standing Heel cord stretch Sidelying Hip abduction x 5 reps Supine Heel slides Dead bug x 5 reps Neuromuscular re-ed: Single leg standing tasks reducing UE support Therapeutic Activity: Incline shoulder flexion x 8 reps, then added 1# x 8 reps  CW and CCW shoulder circles x seconds with 1#  Chest press 1# x 10 reps with incline mat at 30-35 degrees Kneeling initially on a mat table with R then L Les and Ues (climbing into quadriped position) Patient tolerated quadriped on mat, therefore progressed to kneeling on foam pat with bilat UE support on mat moving into 1/2 kneeling to return to stand mod indep with mat surface ot press into (after demo for technique)    [facility] Adult PT Treatment:                                                DATE: 08/10/24 Therapeutic Exercise: Standing  AAROM at door with towel glides into shoulder flexion end range Added moving into stride position to bring gastroc stretch into movement Chest  opening W and Y x 20 seconds bilat x 3 reps Supine SLR x 10 reps Incline table shoulder flexion x 10 reps-- unable to tolerate 1# weight for resistance Therapeutic Activity: Shoulder circles (small) CW and CCW x 30 seconds each direction at 90 deg flexion Supine horizontal abudction with 1# weight x 6 reps Supine protraction/retraction 1# R and L x 10 reps Sidelying R shoulder abduction  x 5 reps with end range stretch to tolerance Standing scaption without weights-- attempted to add yellow bands, but unable to tolerate resistance Single leg standing x 3-5 seconds x 3 reps each side working on visual cuing and balance control   [facility]  Adult PT Treatment:                                                DATE: 08/07/24  Therapeutic Activity: Seated  Shoulder ER Ls x 12 reps Sidelying ER with red therabar R ER with 1# weight  L Shoulder flexion at wall with stretch at end range Sit<>stand x 10 reps Heel raises standing Self Care: We discussed housing resources In [facility] with handouts provided We discussed other resources for seniors including [facility]  PATIENT EDUCATION: Education details: HEP update Person educated: Patient Education method: Programmer, Multimedia, Facilities Manager, Actor cues, and Verbal cues, handout Education comprehension: verbalized understanding, returned demonstration, verbal cues required, tactile cues required, and needs further education  HOME EXERCISE PROGRAM: Access Code: [email or web address] URL: [email or web address] Date: 08/17/2024 Prepared by: Lamarr Price  Exercises - Standing Single Arm Shoulder Flexion Stretch on Wall  - 1 x daily - 7 x weekly - 2 sets - 10 reps - Shoulder External Rotation and Scapular Retraction  - 1 x daily - 7 x weekly - 3 sets - 10 reps - 3-5 sec hold - Corner Pec Major Stretch  - 1 x daily - 7 x weekly - 1 sets - 3 reps - 10-30 sec hold - Gastroc Stretch on Wall  - 1 x daily - 7 x weekly - 1 sets - 2 reps - 30 seconds hold - Standing Hip Abduction with Counter Support  - 1 x daily - 7 x weekly - 2 sets - 10 reps - Standing Hip Extension with Counter Support  - 1 x daily - 7 x weekly - 2 sets - 10 reps - Single Leg Stance with Support  - 1 x daily - 7 x weekly - 1 sets - 3 reps - 10 seconds hold - Sidelying Shoulder Abduction Full Range of Motion  -  1 x daily - 7 x weekly - 5 reps - 10-15 seconds hold - Sidelying Hip Abduction  - 1 x daily - 7 x weekly - 1 sets - 10 reps - Supine Active Straight Leg Raise  - 1 x daily - 7 x weekly - 2 sets - 10 reps - Supine Dead Bug with Leg Extension  - 1 x daily - 7 x weekly - 2 sets - 5 reps - Sidelying Shoulder ER with Towel and Dumbbell  - 1 x daily - 7 x weekly - 2 sets - 10 reps  ASSESSMENT:  CLINICAL IMPRESSION: Continued functional UE/LE strengthening to tolerance. Posterior shoulder pain on Rt with pec stretch variations; modifications provided moderate relief. Fatigue noted with side lying shoulder ER; added exercise to HEP.    EVAL: Patient is a 69 y.o. female who was seen today for physical therapy evaluation and treatment for Right shoulder pain and low back pain, though patient endorses multi-joint pain that is severely limiting her overall functional capacity. Upon assessment pt severely limited right shoulder ROM, however passively close to [facility]. Pt unable to hold tandem stance longer than 5 sec, unable to complete single limb stance bilaterally. She also has lower extremity and upper extremity strength deficits. Pt loves working at her job, and wants to get better so she can work with out pain, upkeep her house, and continue treating her garden. Pt will benefit from skilled therapy to  address the deficits below.   OBJECTIVE IMPAIRMENTS: Abnormal gait, cardiopulmonary status limiting activity, decreased activity tolerance, decreased balance, decreased cognition, decreased coordination, decreased endurance, decreased mobility, difficulty walking, decreased ROM, decreased strength, increased fascial restrictions, increased muscle spasms, impaired flexibility, impaired UE functional use, improper body mechanics, postural dysfunction, and pain.   GOALS: Goals reviewed with patient? Yes  SHORT TERM GOALS: Target date: 07/27/2024  Pt will be efficient with initial HEP so she has increased  independence for everyday activities.  Baseline: no time to issue  Goal status: MET  2.  Pt will improve PSFS score by at least 3 points to improve overall endurance to walk from car to grocery store and back with less than or =5/10 reports of pain  Baseline: 0 07/31/24: 10 Goal status: MET  3.  Pt will be able to reach for objects at chest height with right shoulder so she improves functional capacity for painting and reaching for items at grocery store Baseline: unable 07/31/24: below shoulder height Goal status: NOT MET  4. Patient will complete 5 x STS in </= 15 seconds to improve functional strength.   Baseline: see above 07/31/24: 24 sec   Goal status: NOT MET  LONG TERM GOALS: Target date: 08/25/24  Pt will be efficient with advanced HEP so she has increased independence for everyday activities.  Baseline:  Goal status: INITIAL  2.  Pt will maintain tandem stance  longer than 10 sec without UE support so she has improved balance and is a decreased fall risk Baseline: Unable Goal status: INITIAL  3.  Pt will perform kneeling to the floor from standing using light UE support so she improves functional capacity for gardening and household chores Baseline: unable Goal status: INITIAL  PLAN: PT FREQUENCY: 1-2x/week  PT DURATION: 8 weeks  PLANNED INTERVENTIONS: 97164- PT Re-evaluation, 97750- Physical Performance Testing, 97110-Therapeutic exercises, 97530- Therapeutic activity, [id number]- Neuromuscular re-education, 97535- Self Care, [id number]- Manual therapy, [id number]- Gait training, [phone]- Aquatic Therapy, [phone] (1-2 muscles), [id number] (3+ muscles)- Dry Needling, Patient/Family education, Balance training, Stair training, Cryotherapy, and Moist heat  PLAN FOR NEXT SESSION: review and progress HEP prn; shoulder incline strengthening; general strengthening. Work towards reaching into overhead cabinets, lowering plates/dishes from overhead level, kneeling for gardening tasks. Plan to renew x 4  more weeks by 1/31 LTG date.   Lamarr GORMAN Price, PTA 08/17/24 12:05 PM  "

## 2024-08-21 ENCOUNTER — Ambulatory Visit: Admitting: Rehabilitative and Restorative Service Providers"

## 2024-08-21 DIAGNOSIS — M6281 Muscle weakness (generalized): Secondary | ICD-10-CM

## 2024-08-21 DIAGNOSIS — R2689 Other abnormalities of gait and mobility: Secondary | ICD-10-CM

## 2024-08-21 DIAGNOSIS — R29898 Other symptoms and signs involving the musculoskeletal system: Secondary | ICD-10-CM

## 2024-08-21 NOTE — Therapy (Signed)
 " OUTPATIENT PHYSICAL THERAPY TREATMENT  Patient Name: Lindsay [name] MRN: [id number] DOB:01/18/1956, 69 y.o., female Today's Date: 08/21/2024   END OF SESSION:  PT End of Session - 08/21/24 0929     Visit Number 12    Number of Visits 17    Date for Recertification  08/25/24    Authorization Type Medicare Part A and B    Progress Note Due on Visit 20    PT Start Time 0929    PT Stop Time 1015    PT Time Calculation (min) 46 min    Activity Tolerance Patient tolerated treatment well    Behavior During Therapy [facility] for tasks assessed/performed          Past Medical History:  Diagnosis Date   Abnormal mammogram    Thick Tissue   Acquired trigger finger of right ring finger 04/20/2022   Arthritis    Bilateral wrist pain 04/20/2022   Bipolar 1 disorder    Cataract    OU   Cervical radiculopathy 10/14/2021   Chalazion of left lower eyelid 04/15/2022   Chest pain 02/11/2015   Chronic atrial fibrillation 09/08/2015   Colon polyp    Compressed cervical disc    Coronary artery disease involving native coronary artery with angina pectoris 03/24/2015   Dorsalgia 09/22/2018   Dysphagia 09/22/2018   Essential hypertension 03/03/2015   Generalized anxiety disorder    GERD without esophagitis 09/22/2018   Hematemesis 12/18/2019   Hepatic steatosis 04/22/2022   History of appendectomy 1963   Hypertensive retinopathy    OU   Irritable bowel syndrome without diarrhea 09/22/2018   Lactic acidosis 12/18/2019   LLQ pain 04/15/2022   Major depressive disorder    Migraine without status migrainosus, not intractable 09/22/2018   Mixed hyperlipidemia 05/07/2016   Non-ST elevation MI (NSTEMI) 07/26/2008   No cardiac catheterization at the time.   Obstructive sleep apnea    no CPAP use   Orthostasis    Osteoarthritis of carpometacarpal (CMC) joint of thumb 04/20/2022   Osteoarthritis of knee 09/22/2018   PAF (paroxysmal atrial fibrillation) 10/09/2018   Pain in joint of right hip  07/03/2020   Pain in right knee 12/14/2018   Recurrent syncope    Rib pain 07/03/2020   Sciatica of right side 09/22/2018   Severe alcohol use disorder    Vitamin D  deficiency    Past Surgical History:  Procedure Laterality Date   APPENDECTOMY     CARDIAC CATHETERIZATION N/A 03/25/2015   Procedure: Left Heart Cath and Coronary Angiography;  Surgeon: Alm LELON Clay, MD;  Location: [facility] INVASIVE CV LAB;  Service: Cardiovascular;  Laterality: N/A;   CORONARY STENT INTERVENTION N/A 10/09/2018   Procedure: CORONARY STENT INTERVENTION;  Surgeon: Dann Candyce RAMAN, MD;  Location: [facility] INVASIVE CV LAB;  Service: Cardiovascular;  Laterality: N/A;   DILATATION & CURETTAGE/HYSTEROSCOPY WITH MYOSURE N/A 05/19/2016   Procedure: DILATATION & CURETTAGE/HYSTEROSCOPY WITH MYOSURE;  Surgeon: Hargis Paradise, MD;  Location: [facility];  Service: Gynecology;  Laterality: N/A;  Possible Myosure for polyps.   EYE SURGERY     LEFT HEART CATH AND CORONARY ANGIOGRAPHY N/A 10/09/2018   Procedure: LEFT HEART CATH AND CORONARY ANGIOGRAPHY;  Surgeon: Dann Candyce RAMAN, MD;  Location: [facility] INVASIVE CV LAB;  Service: Cardiovascular;  Laterality: N/A;   MOUTH SURGERY     URETHRAL DILATION     Patient Active Problem List   Diagnosis Date Noted   B12 deficiency 10/24/2023   Seasonal allergies 10/21/2023   Gastroesophageal  reflux disease without esophagitis 10/21/2023   Epigastric pain 10/21/2023   Stress 02/02/2023   Insomnia 12/23/2022   Blood clotting disorder 10/28/2022   Neck pain 10/27/2022   Acute thoracic back pain 10/27/2022   Lumbar radiculopathy 05/26/2022   Degeneration of lumbar intervertebral disc 05/26/2022   Major depressive disorder    Severe alcohol use disorder    Generalized anxiety disorder    Obstructive sleep apnea    Hepatic steatosis 04/22/2022   Osteoarthritis of carpometacarpal (CMC) joint of thumb 04/20/2022   Acquired trigger finger of right ring finger 04/20/2022   Bilateral wrist pain  04/20/2022   Chalazion of left lower eyelid 04/15/2022   Bipolar 1 disorder (HCC)    Cervical radiculopathy 10/14/2021   Pain in joint of right hip 07/03/2020   Rib pain 07/03/2020   Recurrent syncope    Orthostasis    Lactic acidosis 12/18/2019   Hematemesis 12/18/2019   Pain in right knee 12/14/2018   PAF (paroxysmal atrial fibrillation) 10/09/2018   Dorsalgia 09/22/2018   Dysphagia 09/22/2018   GERD without esophagitis 09/22/2018   Irritable bowel syndrome without diarrhea 09/22/2018   Osteoarthritis of knee 09/22/2018   Polypharmacy 09/22/2018   Sciatica of right side 09/22/2018   Vitamin D  deficiency 09/22/2018   Mixed hyperlipidemia 05/07/2016   Chronic atrial fibrillation (HCC) 09/08/2015   Coronary artery disease involving native coronary artery with angina pectoris 03/24/2015   Essential hypertension 03/03/2015   Chest pain 02/11/2015   Non-ST elevation MI (NSTEMI) (HCC) 07/26/2008    PCP: Benton Gave, PA REFERRING PROVIDER: Lauraine Hamlet, FNP REFERRING DIAG: left shoulder and low back pain (patient on provider's notes confirm that it is the right shoulder)   THERAPY DIAG:  Other symptoms and signs involving the musculoskeletal system  Other abnormalities of gait and mobility  Muscle weakness (generalized)  Rationale for Evaluation and Treatment: Rehabilitation  ONSET DATE: 2 years ago (shoulder) (Back): Fall several years ago  SUBJECTIVE:                                                                                                                                                                                      SUBJECTIVE STATEMENT: Patient reports she is feeling good. I'm having very little pain. She notes sitting for longer periods increases pain. She reports she is back to pulling dishes out of cabinet with the R UE without difficulty. The L thumb is still numb and she notes focal swelling.  She reports her HR has been high while sleeping. She  reports she went to fresh market last week and was able to scoot to a back shelf from the ground. She got  down and back up independently.   EVAL: Pt reports she hurts all over, stating the onset of her right shoulder pain occurred 2 years ago from an accident. She does not want to undergo surgery, even though this has been recommended. Reports her back pain is due to falling several years ago. 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 She wants to improve her pain so she can continue to work her garden. No red flag symptoms.  Hand dominance: Right  PERTINENT HISTORY: See extensive PMH above   PAIN:  Are you having pain? Yes: NPRS scale: better today Pain location: multi-joint pain-- mostly Lt hip and knee Pain description: sharp, dull, ache  Aggravating factors: standing for hours, stairs, reaching, walking Relieving factors: Pressure point therapy , hard ball, vibrating bed   PRECAUTIONS: Fall  WEIGHT BEARING RESTRICTIONS: No  FALLS:  Has patient fallen in last 6 months? Yes. Number of falls 2x cannot see very well and was in a hurry   LIVING ENVIRONMENT: Lives with: lives alone Lives in: House/apartment Stairs: Yes: Internal: 3 stories steps; can reach both Has following equipment at home: None  OCCUPATION: Employee at [employer]   PLOF: Independent  PATIENT GOALS: Capacity to maintain my garden and keep my house  OBJECTIVE:  Note: Objective measures were completed at Evaluation unless otherwise noted. PATIENT SURVEYS :  PSFS: THE PATIENT SPECIFIC FUNCTIONAL SCALE  Place score of 0-10 (0 = unable to perform activity and 10 = able to perform activity at the same level as before injury or problem)  Activity Date: 06/28/24 07/31/24   Able to kneel  0 2   2.  Walk into grocery store without LBP 0 6   3.  Reach for dish in cabinet  0 2        Total Score 0 10   Total Score = Sum of activity scores/number of activities  Minimally  Detectable Change: 3 points (for single activity); 2 points (for average score)  [facility] (nd). The Patient Specific Functional Scale . Retrieved from [email or web address]  Cannot kneel down  COGNITION: Overall cognitive status: Within functional limits for tasks assessed     POSTURE: Forward head posture  Lumbar ROM: Limited 75% extension pain Flexion WFL pain Left flexion 25% limited pain Right flexion 25% limited pain  UPPER EXTREMITY ROM:  Active ROM Right eval Left eval Right 07/17/25 Right 08/21/24 AROM  Shoulder flexion 50 active 120 passive WFL  162 degrees AROM Overhead reaching 160 deg AROM  Shoulder extension [facility]    Shoulder abduction 55 deg active 128 passive 105 deg   70 deg AROM   Shoulder adduction      Shoulder internal rotation [facility]    Shoulder external rotation Limited 50% WFL    Elbow flexion      Elbow extension      Wrist flexion      Wrist extension      Wrist ulnar deviation      Wrist radial deviation      Wrist pronation      Wrist supination      (Blank rows = not tested)  UPPER EXTREMITY MMT: MMT Right eval Left eval Right 08/21/24  Shoulder flexion 3-  3/5  Shoulder extension 3+    Shoulder abduction 3-  <3/5  Shoulder adduction     Shoulder internal rotation 4-    Shoulder external rotation 3+  Middle trapezius     Lower trapezius     Elbow flexion     Elbow extension     Wrist flexion     Wrist extension     Wrist ulnar deviation     Wrist radial deviation     Wrist pronation     Wrist supination     Grip strength (lbs)     (Blank rows = not tested)  LOWER EXTREMITY MMT:   MMT Right eval Left eval  Hip flexion 4 4  Hip extension    Hip abduction    Hip adduction    Hip internal rotation    Hip external rotation    Knee flexion 5 4- pain   Knee extension 4 4  Ankle dorsiflexion    Ankle plantarflexion    Ankle inversion    Ankle  eversion     (Blank rows = not tested)   Functional testing: Tandem stance: 8 sec before balance loss Single limb stance bilaterally: unable  Sit to stands 19.11 sec   [facility] Adult PT Treatment:                                                DATE: 08/21/24 Therapeutic Exercise: SA activation with band + flexion UBE x 2 minutes (1 forward/1 backwards) Sidelying sleeper stretch Neuromuscular re-ed: Tandem standing without UE support Marching in place Therapeutic Activity: Standing with L foot on 8 step with knee flexion within tolerable range of motion Isometrics x 5 second x 8 reps in doorframe with shoulder abduction Reactive isometrics ER and IR x 6 reps ( 3 steps to each side) Standing scaption with 1# x 6 reps-- R UE fatigues and she gets shoulder elevation-- added a mirror for feedback Standing overhead reaching with 1#, 2# into top shelf of overhead cabinet AAROM finger walking abduction x 5 reps within tolerable range AAROM with R UE on physioball x 6 reps--creates pain in deltoid region-- so discontinued Wall lean with scapular protraction/retraction Wall slides with pinkie for SA activation    [facility] Adult PT Treatment:                                                DATE: 08/17/2024 Therapeutic Exercise: Standing gastroc/soleus stretches Pec stretch in doorway --> 90 degrees position felt best 3 x 30 sec Passive pec stretch on towel roll --> W arms; discontinued d/t Rt shoulder pain Neuromuscular re-ed: Seated straight leg raise SA wall slides without shoulder elevation Side lying shoulder ER + 1#DB 10 x 3 sec Therapeutic Activity: Shoulder flexion wall slides with 1#DB 2x10 Scap clock Self Care: Discussion on emergency plan with upcoming winter storm   [facility] Adult PT Treatment:                                                DATE: 08/14/24 Therapeutic Exercise: Standing Heel cord stretch Sidelying Hip abduction x 5 reps Supine Heel slides Dead bug x 5  reps Neuromuscular re-ed: Single leg standing tasks reducing UE support Therapeutic Activity: Incline shoulder flexion x 8 reps, then  added 1# x 8 reps  CW and CCW shoulder circles x seconds with 1#  Chest press 1# x 10 reps with incline mat at 30-35 degrees Kneeling initially on a mat table with R then L Les and Ues (climbing into quadriped position) Patient tolerated quadriped on mat, therefore progressed to kneeling on foam pat with bilat UE support on mat moving into 1/2 kneeling to return to stand mod indep with mat surface ot press into (after demo for technique)  PATIENT EDUCATION: Education details: HEP update Person educated: Patient Education method: Explanation, Demonstration, Tactile cues, and Verbal cues, handout Education comprehension: verbalized understanding, returned demonstration, verbal cues required, tactile cues required, and needs further education  HOME EXERCISE PROGRAM: Access Code: [email or web address] URL: [email or web address] Date: 08/17/2024 Prepared by: Lamarr Price  Exercises - Standing Single Arm Shoulder Flexion Stretch on Wall  - 1 x daily - 7 x weekly - 2 sets - 10 reps - Shoulder External Rotation and Scapular Retraction  - 1 x daily - 7 x weekly - 3 sets - 10 reps - 3-5 sec hold - Corner Pec Major Stretch  - 1 x daily - 7 x weekly - 1 sets - 3 reps - 10-30 sec hold - Gastroc Stretch on Wall  - 1 x daily - 7 x weekly - 1 sets - 2 reps - 30 seconds hold - Standing Hip Abduction with Counter Support  - 1 x daily - 7 x weekly - 2 sets - 10 reps - Standing Hip Extension with Counter Support  - 1 x daily - 7 x weekly - 2 sets - 10 reps - Single Leg Stance with Support  - 1 x daily - 7 x weekly - 1 sets - 3 reps - 10 seconds hold - Sidelying Shoulder Abduction Full Range of Motion  - 1 x daily - 7 x weekly - 5 reps - 10-15 seconds hold - Sidelying Hip Abduction  - 1 x daily - 7 x weekly - 1 sets - 10 reps - Supine Active Straight Leg Raise  - 1 x  daily - 7 x weekly - 2 sets - 10 reps - Supine Dead Bug with Leg Extension  - 1 x daily - 7 x weekly - 2 sets - 5 reps - Sidelying Shoulder ER with Towel and Dumbbell  - 1 x daily - 7 x weekly - 2 sets - 10 reps  ASSESSMENT:  CLINICAL IMPRESSION: PT has met 2 LTGs. We plan to renew next visit to continue 4 more weeks focusing on shoulder functional strength, knee ROM/strength and functional independence. Patient is making progress--- she has deficits in R UE strength, R UE abduction ROM, and functional balance.    EVAL: Patient is a 69 y.o. female who was seen today for physical therapy evaluation and treatment for Right shoulder pain and low back pain, though patient endorses multi-joint pain that is severely limiting her overall functional capacity. Upon assessment pt severely limited right shoulder ROM, however passively close to [facility]. Pt unable to hold tandem stance longer than 5 sec, unable to complete single limb stance bilaterally. She also has lower extremity and upper extremity strength deficits. Pt loves working at her job, and wants to get better so she can work with out pain, upkeep her house, and continue treating her garden. Pt will benefit from skilled therapy to address the deficits below.   OBJECTIVE IMPAIRMENTS: Abnormal gait, cardiopulmonary status limiting activity, decreased activity tolerance, decreased balance, decreased cognition, decreased coordination, decreased  endurance, decreased mobility, difficulty walking, decreased ROM, decreased strength, increased fascial restrictions, increased muscle spasms, impaired flexibility, impaired UE functional use, improper body mechanics, postural dysfunction, and pain.   GOALS: Goals reviewed with patient? Yes  SHORT TERM GOALS: Target date: 07/27/2024  Pt will be efficient with initial HEP so she has increased independence for everyday activities.  Baseline: no time to issue  Goal status: MET  2.  Pt will improve PSFS score by at  least 3 points to improve overall endurance to walk from car to grocery store and back with less than or =5/10 reports of pain  Baseline: 0 07/31/24: 10 Goal status: MET  3.  Pt will be able to reach for objects at chest height with right shoulder so she improves functional capacity for painting and reaching for items at grocery store Baseline: unable 07/31/24: below shoulder height Goal status: NOT MET  4. Patient will complete 5 x STS in </= 15 seconds to improve functional strength.   Baseline: see above 07/31/24: 24 sec   Goal status: NOT MET  LONG TERM GOALS: Target date: 08/25/24  Pt will be efficient with advanced HEP so she has increased independence for everyday activities.  Baseline:  Goal status: INITIAL  2.  Pt will maintain tandem stance  longer than 10 sec without UE support so she has improved balance and is a decreased fall risk Baseline: Unable Goal status: MET   3.  Pt will perform kneeling to the floor from standing using light UE support so she improves functional capacity for gardening and household chores Baseline: unable Goal status: MET   PLAN: PT FREQUENCY: 1-2x/week  PT DURATION: 8 weeks  PLANNED INTERVENTIONS: [id number]- PT Re-evaluation, 97750- Physical Performance Testing, 97110-Therapeutic exercises, 97530- Therapeutic activity, [id number]- Neuromuscular re-education, 97535- Self Care, [id number]- Manual therapy, [id number]- Gait training, [phone]- Aquatic Therapy, [phone] (1-2 muscles), [id number] (3+ muscles)- Dry Needling, Patient/Family education, Balance training, Stair training, Cryotherapy, and Moist heat  PLAN FOR NEXT SESSION: review and progress HEP prn; shoulder incline strengthening; general strengthening. Work towards reaching into overhead cabinets, lowering plates/dishes from overhead level, kneeling for gardening tasks. Plan to renew x 4 more weeks by 1/31 LTG date.  RENEW next visit  Marysa Wessner, PT 08/21/24 9:29 AM  "

## 2024-08-23 ENCOUNTER — Other Ambulatory Visit (HOSPITAL_BASED_OUTPATIENT_CLINIC_OR_DEPARTMENT_OTHER)

## 2024-08-24 ENCOUNTER — Ambulatory Visit

## 2024-08-24 DIAGNOSIS — R2689 Other abnormalities of gait and mobility: Secondary | ICD-10-CM

## 2024-08-24 DIAGNOSIS — R29898 Other symptoms and signs involving the musculoskeletal system: Secondary | ICD-10-CM | POA: Diagnosis not present

## 2024-08-24 DIAGNOSIS — G8929 Other chronic pain: Secondary | ICD-10-CM

## 2024-08-24 DIAGNOSIS — R262 Difficulty in walking, not elsewhere classified: Secondary | ICD-10-CM

## 2024-08-24 DIAGNOSIS — M6281 Muscle weakness (generalized): Secondary | ICD-10-CM

## 2024-08-24 NOTE — Therapy (Signed)
 " OUTPATIENT PHYSICAL THERAPY TREATMENT  Patient Name: Lindsay [name] MRN: [id number] DOB:Jan 18, 1956, 69 y.o., female Today's Date: 08/24/2024   END OF SESSION:  PT End of Session - 08/24/24 1109     Visit Number 13    Number of Visits 17    Date for Recertification  08/25/24    Authorization Type Medicare Part A and B    Progress Note Due on Visit 20    PT Start Time 1109   late check-in   PT Stop Time 1147    PT Time Calculation (min) 38 min    Activity Tolerance Patient tolerated treatment well    Behavior During Therapy [facility] for tasks assessed/performed          Past Medical History:  Diagnosis Date   Abnormal mammogram    Thick Tissue   Acquired trigger finger of right ring finger 04/20/2022   Arthritis    Bilateral wrist pain 04/20/2022   Bipolar 1 disorder    Cataract    OU   Cervical radiculopathy 10/14/2021   Chalazion of left lower eyelid 04/15/2022   Chest pain 02/11/2015   Chronic atrial fibrillation 09/08/2015   Colon polyp    Compressed cervical disc    Coronary artery disease involving native coronary artery with angina pectoris 03/24/2015   Dorsalgia 09/22/2018   Dysphagia 09/22/2018   Essential hypertension 03/03/2015   Generalized anxiety disorder    GERD without esophagitis 09/22/2018   Hematemesis 12/18/2019   Hepatic steatosis 04/22/2022   History of appendectomy 1963   Hypertensive retinopathy    OU   Irritable bowel syndrome without diarrhea 09/22/2018   Lactic acidosis 12/18/2019   LLQ pain 04/15/2022   Major depressive disorder    Migraine without status migrainosus, not intractable 09/22/2018   Mixed hyperlipidemia 05/07/2016   Non-ST elevation MI (NSTEMI) 07/26/2008   No cardiac catheterization at the time.   Obstructive sleep apnea    no CPAP use   Orthostasis    Osteoarthritis of carpometacarpal (CMC) joint of thumb 04/20/2022   Osteoarthritis of knee 09/22/2018   PAF (paroxysmal atrial fibrillation) 10/09/2018   Pain in joint of  right hip 07/03/2020   Pain in right knee 12/14/2018   Recurrent syncope    Rib pain 07/03/2020   Sciatica of right side 09/22/2018   Severe alcohol use disorder    Vitamin D  deficiency    Past Surgical History:  Procedure Laterality Date   APPENDECTOMY     CARDIAC CATHETERIZATION N/A 03/25/2015   Procedure: Left Heart Cath and Coronary Angiography;  Surgeon: Alm LELON Clay, MD;  Location: [facility] INVASIVE CV LAB;  Service: Cardiovascular;  Laterality: N/A;   CORONARY STENT INTERVENTION N/A 10/09/2018   Procedure: CORONARY STENT INTERVENTION;  Surgeon: Dann Candyce RAMAN, MD;  Location: [facility] INVASIVE CV LAB;  Service: Cardiovascular;  Laterality: N/A;   DILATATION & CURETTAGE/HYSTEROSCOPY WITH MYOSURE N/A 05/19/2016   Procedure: DILATATION & CURETTAGE/HYSTEROSCOPY WITH MYOSURE;  Surgeon: Hargis Paradise, MD;  Location: [facility] ORS;  Service: Gynecology;  Laterality: N/A;  Possible Myosure for polyps.   EYE SURGERY     LEFT HEART CATH AND CORONARY ANGIOGRAPHY N/A 10/09/2018   Procedure: LEFT HEART CATH AND CORONARY ANGIOGRAPHY;  Surgeon: Dann Candyce RAMAN, MD;  Location: [facility] INVASIVE CV LAB;  Service: Cardiovascular;  Laterality: N/A;   MOUTH SURGERY     URETHRAL DILATION     Patient Active Problem List   Diagnosis Date Noted   B12 deficiency 10/24/2023   Seasonal allergies 10/21/2023  Gastroesophageal reflux disease without esophagitis 10/21/2023   Epigastric pain 10/21/2023   Stress 02/02/2023   Insomnia 12/23/2022   Blood clotting disorder 10/28/2022   Neck pain 10/27/2022   Acute thoracic back pain 10/27/2022   Lumbar radiculopathy 05/26/2022   Degeneration of lumbar intervertebral disc 05/26/2022   Major depressive disorder    Severe alcohol use disorder    Generalized anxiety disorder    Obstructive sleep apnea    Hepatic steatosis 04/22/2022   Osteoarthritis of carpometacarpal (CMC) joint of thumb 04/20/2022   Acquired trigger finger of right ring finger 04/20/2022   Bilateral  wrist pain 04/20/2022   Chalazion of left lower eyelid 04/15/2022   Bipolar 1 disorder (HCC)    Cervical radiculopathy 10/14/2021   Pain in joint of right hip 07/03/2020   Rib pain 07/03/2020   Recurrent syncope    Orthostasis    Lactic acidosis 12/18/2019   Hematemesis 12/18/2019   Pain in right knee 12/14/2018   PAF (paroxysmal atrial fibrillation) 10/09/2018   Dorsalgia 09/22/2018   Dysphagia 09/22/2018   GERD without esophagitis 09/22/2018   Irritable bowel syndrome without diarrhea 09/22/2018   Osteoarthritis of knee 09/22/2018   Polypharmacy 09/22/2018   Sciatica of right side 09/22/2018   Vitamin D  deficiency 09/22/2018   Mixed hyperlipidemia 05/07/2016   Chronic atrial fibrillation (HCC) 09/08/2015   Coronary artery disease involving native coronary artery with angina pectoris 03/24/2015   Essential hypertension 03/03/2015   Chest pain 02/11/2015   Non-ST elevation MI (NSTEMI) (HCC) 07/26/2008    PCP: Benton Gave, PA REFERRING PROVIDER: Lauraine Hamlet, FNP REFERRING DIAG: left shoulder and low back pain (patient on provider's notes confirm that it is the right shoulder)   THERAPY DIAG:  Other symptoms and signs involving the musculoskeletal system  Other abnormalities of gait and mobility  Muscle weakness (generalized)  Chronic pain of both knees  Difficulty in walking, not elsewhere classified  Rationale for Evaluation and Treatment: Rehabilitation  ONSET DATE: 2 years ago (shoulder) (Back): Fall several years ago  SUBJECTIVE:                                                                                                                                                                                      SUBJECTIVE STATEMENT: Patient reports she slipped and fell on ice on Wednesday, states the back of her Lt hip/buttocks is sore but no fractures/further injury. States she is able to take a dish out of the cabinet and reach up to turn on lamp with Rt  arm.   EVAL: Pt reports she hurts all over, stating the onset of her right shoulder pain occurred 2 years ago  from an accident. She does not want to undergo surgery, even though this has been recommended. Reports her back pain is due to falling several years ago. 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 She wants to improve her pain so she can continue to work her garden. No red flag symptoms.  Hand dominance: Right  PERTINENT HISTORY: See extensive PMH above   PAIN:  Are you having pain? Yes: NPRS scale: better today Pain location: multi-joint pain-- mostly Lt hip and knee Pain description: sharp, dull, ache  Aggravating factors: standing for hours, stairs, reaching, walking Relieving factors: Pressure point therapy , hard ball, vibrating bed   PRECAUTIONS: Fall  WEIGHT BEARING RESTRICTIONS: No  FALLS:  Has patient fallen in last 6 months? Yes. Number of falls 2x cannot see very well and was in a hurry   LIVING ENVIRONMENT: Lives with: lives alone Lives in: House/apartment Stairs: Yes: Internal: 3 stories steps; can reach both Has following equipment at home: None  OCCUPATION: Employee at [employer]   PLOF: Independent  PATIENT GOALS: Capacity to maintain my garden and keep my house  OBJECTIVE:  Note: Objective measures were completed at Evaluation unless otherwise noted. PATIENT SURVEYS :  PSFS: THE PATIENT SPECIFIC FUNCTIONAL SCALE  Place score of 0-10 (0 = unable to perform activity and 10 = able to perform activity at the same level as before injury or problem)  Activity Date: 06/28/24 07/31/24   Able to kneel  0 2   2.  Walk into grocery store without LBP 0 6   3.  Reach for dish in cabinet  0 2        Total Score 0 10   Total Score = Sum of activity scores/number of activities  Minimally Detectable Change: 3 points (for single activity); 2 points (for average score)  [facility] (nd). The Patient Specific  Functional Scale . Retrieved from [email or web address]  Cannot kneel down  COGNITION: Overall cognitive status: Within functional limits for tasks assessed     POSTURE: Forward head posture  Lumbar ROM: Limited 75% extension pain Flexion WFL pain Left flexion 25% limited pain Right flexion 25% limited pain  UPPER EXTREMITY ROM:  Active ROM Right eval Left eval Right 07/17/25 Right 08/21/24 AROM  Shoulder flexion 50 active 120 passive WFL  162 degrees AROM Overhead reaching 160 deg AROM  Shoulder extension [facility]    Shoulder abduction 55 deg active 128 passive 105 deg   70 deg AROM   Shoulder adduction      Shoulder internal rotation [facility]    Shoulder external rotation Limited 50% WFL    Elbow flexion      Elbow extension      Wrist flexion      Wrist extension      Wrist ulnar deviation      Wrist radial deviation      Wrist pronation      Wrist supination      (Blank rows = not tested)  UPPER EXTREMITY MMT: MMT Right eval Left eval Right 08/21/24  Shoulder flexion 3-  3/5  Shoulder extension 3+    Shoulder abduction 3-  <3/5  Shoulder adduction     Shoulder internal rotation 4-    Shoulder external rotation 3+    Middle trapezius     Lower trapezius     Elbow flexion     Elbow extension  Wrist flexion     Wrist extension     Wrist ulnar deviation     Wrist radial deviation     Wrist pronation     Wrist supination     Grip strength (lbs)     (Blank rows = not tested)  LOWER EXTREMITY MMT:   MMT Right eval Left eval  Hip flexion 4 4  Hip extension    Hip abduction    Hip adduction    Hip internal rotation    Hip external rotation    Knee flexion 5 4- pain   Knee extension 4 4  Ankle dorsiflexion    Ankle plantarflexion    Ankle inversion    Ankle eversion     (Blank rows = not tested)   Functional testing: Tandem stance: 8 sec before balance loss Single limb stance bilaterally:  unable  Sit to stands 19.11 sec    [facility] Adult PT Treatment:                                                DATE: 08/24/2024 Neuromuscular re-ed: Scap push ups on wall SA wall slides Shoulder abduction isometric 10 x 5 sec Standing shoulder scaption with yellow TB --> partial range to fatigue Scap clock + yellow TB Shoulder flexion wall slide + 1#DB Therapeutic Activity: Seated scapula retraction  UBE L2 x 1.5 min fwd/1.5 min bkwd Mini wall squat --> gradually progressing range as tolerated Wall push-up  Split lunge partial range lunge (bil) + UE support     [facility] Adult PT Treatment:                                                DATE: 08/21/24 Therapeutic Exercise: SA activation with band + flexion UBE x 2 minutes (1 forward/1 backwards) Sidelying sleeper stretch Neuromuscular re-ed: Tandem standing without UE support Marching in place Therapeutic Activity: Standing with L foot on 8 step with knee flexion within tolerable range of motion Isometrics x 5 second x 8 reps in doorframe with shoulder abduction Reactive isometrics ER and IR x 6 reps ( 3 steps to each side) Standing scaption with 1# x 6 reps-- R UE fatigues and she gets shoulder elevation-- added a mirror for feedback Standing overhead reaching with 1#, 2# into top shelf of overhead cabinet AAROM finger walking abduction x 5 reps within tolerable range AAROM with R UE on physioball x 6 reps--creates pain in deltoid region-- so discontinued Wall lean with scapular protraction/retraction Wall slides with pinkie for SA activation    [facility] Adult PT Treatment:                                                DATE: 08/17/2024 Therapeutic Exercise: Standing gastroc/soleus stretches Pec stretch in doorway --> 90 degrees position felt best 3 x 30 sec Passive pec stretch on towel roll --> W arms; discontinued d/t Rt shoulder pain Neuromuscular re-ed: Seated straight leg raise SA wall slides without shoulder elevation Side  lying shoulder ER + 1#DB 10 x 3 sec Therapeutic Activity: Shoulder flexion wall slides  with 1#DB 2x10 Scap clock Self Care: Discussion on emergency plan with upcoming winter storm   [facility] Adult PT Treatment:                                                DATE: 08/14/24 Therapeutic Exercise: Standing Heel cord stretch Sidelying Hip abduction x 5 reps Supine Heel slides Dead bug x 5 reps Neuromuscular re-ed: Single leg standing tasks reducing UE support Therapeutic Activity: Incline shoulder flexion x 8 reps, then added 1# x 8 reps  CW and CCW shoulder circles x seconds with 1#  Chest press 1# x 10 reps with incline mat at 30-35 degrees Kneeling initially on a mat table with R then L Les and Ues (climbing into quadriped position) Patient tolerated quadriped on mat, therefore progressed to kneeling on foam pat with bilat UE support on mat moving into 1/2 kneeling to return to stand mod indep with mat surface ot press into (after demo for technique)  PATIENT EDUCATION: Education details: HEP update Person educated: Patient Education method: Explanation, Demonstration, Tactile cues, and Verbal cues, handout Education comprehension: verbalized understanding, returned demonstration, verbal cues required, tactile cues required, and needs further education  HOME EXERCISE PROGRAM: Access Code: [email or web address] URL: [email or web address] Date: 08/24/2024 Prepared by: Lamarr Price  Exercises - Standing Single Arm Shoulder Flexion Stretch on Wall  - 1 x daily - 7 x weekly - 2 sets - 10 reps - Shoulder External Rotation and Scapular Retraction  - 1 x daily - 7 x weekly - 3 sets - 10 reps - 3-5 sec hold - Corner Pec Major Stretch  - 1 x daily - 7 x weekly - 1 sets - 3 reps - 10-30 sec hold - Gastroc Stretch on Wall  - 1 x daily - 7 x weekly - 1 sets - 2 reps - 30 seconds hold - Standing Hip Abduction with Counter Support  - 1 x daily - 7 x weekly - 2 sets - 10 reps - Standing Hip  Extension with Counter Support  - 1 x daily - 7 x weekly - 2 sets - 10 reps - Single Leg Stance with Support  - 1 x daily - 7 x weekly - 1 sets - 3 reps - 10 seconds hold - Sidelying Shoulder Abduction Full Range of Motion  - 1 x daily - 7 x weekly - 5 reps - 10-15 seconds hold - Sidelying Hip Abduction  - 1 x daily - 7 x weekly - 1 sets - 10 reps - Supine Active Straight Leg Raise  - 1 x daily - 7 x weekly - 2 sets - 10 reps - Supine Dead Bug with Leg Extension  - 1 x daily - 7 x weekly - 2 sets - 5 reps - Sidelying Shoulder ER with Towel and Dumbbell  - 1 x daily - 7 x weekly - 2 sets - 10 reps - Standing Knee Flexion Stretch on Step  - 1 x daily - 7 x weekly - 1 sets - 3 reps - 30 seconds hold - Sleeper Stretch  - 1 x daily - 7 x weekly - 1 sets - 3 reps - 20-30 seconds hold - Wall Quarter Squat  - 2 x daily - 7 x weekly - 1 sets - 10 reps - 3 sec hold - Wall Push Up  - 2 x  daily - 7 x weekly - 1 sets - 8-10 reps  ASSESSMENT:  CLINICAL IMPRESSION: Progressed shoulder strengthening with light resistance to tolerance; improved endurance and self-correction with verbal cues. Incorporated functional LE strengthening in pain-free range of motion. Good tolerance with all exercises with fatigue being most limiting factor.   EVAL: Patient is a 69 y.o. female who was seen today for physical therapy evaluation and treatment for Right shoulder pain and low back pain, though patient endorses multi-joint pain that is severely limiting her overall functional capacity. Upon assessment pt severely limited right shoulder ROM, however passively close to [facility]. Pt unable to hold tandem stance longer than 5 sec, unable to complete single limb stance bilaterally. She also has lower extremity and upper extremity strength deficits. Pt loves working at her job, and wants to get better so she can work with out pain, upkeep her house, and continue treating her garden. Pt will benefit from skilled therapy to address the  deficits below.   OBJECTIVE IMPAIRMENTS: Abnormal gait, cardiopulmonary status limiting activity, decreased activity tolerance, decreased balance, decreased cognition, decreased coordination, decreased endurance, decreased mobility, difficulty walking, decreased ROM, decreased strength, increased fascial restrictions, increased muscle spasms, impaired flexibility, impaired UE functional use, improper body mechanics, postural dysfunction, and pain.   GOALS: Goals reviewed with patient? Yes  SHORT TERM GOALS: Target date: 07/27/2024  Pt will be efficient with initial HEP so she has increased independence for everyday activities.  Baseline: no time to issue  Goal status: MET  2.  Pt will improve PSFS score by at least 3 points to improve overall endurance to walk from car to grocery store and back with less than or =5/10 reports of pain  Baseline: 0 07/31/24: 10 Goal status: MET  3.  Pt will be able to reach for objects at chest height with right shoulder so she improves functional capacity for painting and reaching for items at grocery store Baseline: unable 07/31/24: below shoulder height Goal status: NOT MET  4. Patient will complete 5 x STS in </= 15 seconds to improve functional strength.   Baseline: see above 07/31/24: 24 sec   Goal status: NOT MET  LONG TERM GOALS: Target date: 08/25/24  Pt will be efficient with advanced HEP so she has increased independence for everyday activities.  Baseline:  Goal status: INITIAL  2.  Pt will maintain tandem stance  longer than 10 sec without UE support so she has improved balance and is a decreased fall risk Baseline: Unable Goal status: MET   3.  Pt will perform kneeling to the floor from standing using light UE support so she improves functional capacity for gardening and household chores Baseline: unable Goal status: MET   PLAN: PT FREQUENCY: 1-2x/week  PT DURATION: 8 weeks  PLANNED INTERVENTIONS: 97164- PT Re-evaluation, 97750-  Physical Performance Testing, 97110-Therapeutic exercises, 97530- Therapeutic activity, [id number]- Neuromuscular re-education, 97535- Self Care, [id number]- Manual therapy, [id number]- Gait training, [id number]- Aquatic Therapy, [phone] (1-2 muscles), 20561 (3+ muscles)- Dry Needling, Patient/Family education, Balance training, Stair training, Cryotherapy, and Moist heat  PLAN FOR NEXT SESSION: Re-Eval next visit. review and progress HEP prn; shoulder incline strengthening; general strengthening. Work towards reaching into overhead cabinets, lowering plates/dishes from overhead level, kneeling for gardening tasks. Plan to renew x 4 more weeks by 1/31 LTG date.  RENEW next visit  Lamarr GORMAN Price, PTA 08/24/24 11:52 AM  "

## 2024-08-27 ENCOUNTER — Telehealth: Admitting: Urgent Care

## 2024-08-27 ENCOUNTER — Encounter: Payer: Self-pay | Admitting: Urgent Care

## 2024-08-27 DIAGNOSIS — I10 Essential (primary) hypertension: Secondary | ICD-10-CM

## 2024-08-27 DIAGNOSIS — Z79899 Other long term (current) drug therapy: Secondary | ICD-10-CM | POA: Diagnosis not present

## 2024-08-27 DIAGNOSIS — I25119 Atherosclerotic heart disease of native coronary artery with unspecified angina pectoris: Secondary | ICD-10-CM

## 2024-08-27 DIAGNOSIS — Z5181 Encounter for therapeutic drug level monitoring: Secondary | ICD-10-CM | POA: Diagnosis not present

## 2024-08-27 DIAGNOSIS — I25118 Atherosclerotic heart disease of native coronary artery with other forms of angina pectoris: Secondary | ICD-10-CM | POA: Diagnosis not present

## 2024-08-27 DIAGNOSIS — E785 Hyperlipidemia, unspecified: Secondary | ICD-10-CM

## 2024-08-27 DIAGNOSIS — Z1231 Encounter for screening mammogram for malignant neoplasm of breast: Secondary | ICD-10-CM

## 2024-08-27 DIAGNOSIS — K219 Gastro-esophageal reflux disease without esophagitis: Secondary | ICD-10-CM

## 2024-08-27 MED ORDER — LOSARTAN POTASSIUM 50 MG PO TABS: 50.0000 mg | ORAL_TABLET | Freq: Every day | ORAL | 2 refills | Status: AC

## 2024-08-27 MED ORDER — REPATHA SURECLICK 140 MG/ML ~~LOC~~ SOAJ: 140.0000 mg | SUBCUTANEOUS | 1 refills | Status: AC

## 2024-08-27 MED ORDER — METOPROLOL TARTRATE 25 MG PO TABS: ORAL_TABLET | ORAL | 3 refills | Status: AC

## 2024-08-27 MED ORDER — AMLODIPINE BESYLATE 2.5 MG PO TABS: 2.5000 mg | ORAL_TABLET | Freq: Every day | ORAL | 3 refills | Status: AC

## 2024-08-27 MED ORDER — PANTOPRAZOLE SODIUM 40 MG PO TBEC: 40.0000 mg | DELAYED_RELEASE_TABLET | Freq: Every day | ORAL | 3 refills | Status: AC

## 2024-08-27 MED ORDER — CLOPIDOGREL BISULFATE 75 MG PO TABS: 75.0000 mg | ORAL_TABLET | Freq: Every day | ORAL | 1 refills | Status: AC

## 2024-08-27 NOTE — Patient Instructions (Signed)
 Continue all medications as ordered. Please come fasting for routine labs Mid-march Please update your screening mammogram.

## 2024-08-27 NOTE — Assessment & Plan Note (Signed)
" °  Orders:   Evolocumab  (REPATHA  SURECLICK) 140 MG/ML SOAJ; Inject 140 mg into the skin every 14 (fourteen) days.  "

## 2024-08-27 NOTE — Assessment & Plan Note (Signed)
" °  Orders:   losartan  (COZAAR ) 50 MG tablet; Take 1 tablet (50 mg total) by mouth daily.   metoprolol  tartrate (LOPRESSOR ) 25 MG tablet; Take one half (0.5) tablet in the am and one (1) tablet ( 25 mg) in the pm   amLODipine  (NORVASC ) 2.5 MG tablet; Take 1 tablet (2.5 mg total) by mouth daily.   CBC with Differential/Platelet   Hemoglobin A1c   TSH   Comprehensive metabolic panel with GFR  "

## 2024-08-27 NOTE — Assessment & Plan Note (Signed)
" °  Orders:   pantoprazole  (PROTONIX ) 40 MG tablet; Take 1 tablet (40 mg total) by mouth daily.   Comprehensive metabolic panel with GFR    "

## 2024-08-28 ENCOUNTER — Ambulatory Visit

## 2024-08-29 ENCOUNTER — Ambulatory Visit (HOSPITAL_COMMUNITY): Admitting: Licensed Clinical Social Worker

## 2024-08-30 NOTE — Progress Notes (Signed)
 Patient was a late cancel for appointment due to work

## 2024-08-31 ENCOUNTER — Ambulatory Visit

## 2024-09-04 ENCOUNTER — Ambulatory Visit: Admitting: Rehabilitative and Restorative Service Providers"

## 2024-09-07 ENCOUNTER — Ambulatory Visit: Admitting: Rehabilitative and Restorative Service Providers"

## 2024-09-12 ENCOUNTER — Ambulatory Visit (HOSPITAL_COMMUNITY): Admitting: Licensed Clinical Social Worker

## 2024-09-20 ENCOUNTER — Other Ambulatory Visit (HOSPITAL_BASED_OUTPATIENT_CLINIC_OR_DEPARTMENT_OTHER)

## 2024-09-26 ENCOUNTER — Ambulatory Visit (HOSPITAL_COMMUNITY): Admitting: Licensed Clinical Social Worker

## 2024-10-02 ENCOUNTER — Ambulatory Visit (HOSPITAL_COMMUNITY): Admitting: Psychiatry

## 2024-10-10 ENCOUNTER — Ambulatory Visit (HOSPITAL_COMMUNITY): Admitting: Licensed Clinical Social Worker

## 2024-10-18 ENCOUNTER — Encounter (HOSPITAL_BASED_OUTPATIENT_CLINIC_OR_DEPARTMENT_OTHER): Admitting: Nurse Practitioner
